# Patient Record
Sex: Male | Born: 1970 | Hispanic: No | Marital: Single | Smoking: Former smoker
Health system: Southern US, Community
[De-identification: ages and names within clinical notes are randomized; demographics above are authoritative.]

## PROBLEM LIST (undated history)

## (undated) DIAGNOSIS — N189 Chronic kidney disease, unspecified: Secondary | ICD-10-CM

## (undated) DIAGNOSIS — I10 Essential (primary) hypertension: Secondary | ICD-10-CM

## (undated) DIAGNOSIS — I5022 Chronic systolic (congestive) heart failure: Secondary | ICD-10-CM

## (undated) DIAGNOSIS — I2510 Atherosclerotic heart disease of native coronary artery without angina pectoris: Secondary | ICD-10-CM

## (undated) DIAGNOSIS — I77810 Thoracic aortic ectasia: Secondary | ICD-10-CM

## (undated) DIAGNOSIS — N182 Chronic kidney disease, stage 2 (mild): Secondary | ICD-10-CM

## (undated) DIAGNOSIS — R809 Proteinuria, unspecified: Secondary | ICD-10-CM

## (undated) DIAGNOSIS — I255 Ischemic cardiomyopathy: Secondary | ICD-10-CM

## (undated) DIAGNOSIS — I2721 Secondary pulmonary arterial hypertension: Secondary | ICD-10-CM

## (undated) DIAGNOSIS — I77819 Aortic ectasia, unspecified site: Secondary | ICD-10-CM

## (undated) DIAGNOSIS — I509 Heart failure, unspecified: Secondary | ICD-10-CM

## (undated) DIAGNOSIS — K5090 Crohn's disease, unspecified, without complications: Secondary | ICD-10-CM

## (undated) HISTORY — DX: Chronic kidney disease, stage 2 (mild): N18.2

## (undated) HISTORY — DX: Heart failure, unspecified: I50.9

## (undated) HISTORY — DX: Secondary pulmonary arterial hypertension: I27.21

## (undated) HISTORY — DX: Chronic systolic (congestive) heart failure: I50.22

## (undated) HISTORY — DX: Chronic kidney disease, unspecified: N18.9

## (undated) HISTORY — DX: Atherosclerotic heart disease of native coronary artery without angina pectoris: I25.10

## (undated) HISTORY — PX: COLECTOMY: SHX59

## (undated) HISTORY — DX: Ischemic cardiomyopathy: I25.5

## (undated) HISTORY — DX: Thoracic aortic ectasia: I77.810

## (undated) HISTORY — DX: Aortic ectasia, unspecified site: I77.819

---

## 2009-04-17 ENCOUNTER — Emergency Department: Payer: Self-pay | Admitting: Emergency Medicine

## 2010-06-11 ENCOUNTER — Emergency Department (HOSPITAL_COMMUNITY)
Admission: EM | Admit: 2010-06-11 | Discharge: 2010-06-11 | Payer: Self-pay | Source: Home / Self Care | Admitting: Emergency Medicine

## 2010-06-11 LAB — DIFFERENTIAL
Basophils Absolute: 0 10*3/uL (ref 0.0–0.1)
Basophils Relative: 0 % (ref 0–1)
Eosinophils Absolute: 0.2 10*3/uL (ref 0.0–0.7)
Eosinophils Relative: 3 % (ref 0–5)
Monocytes Absolute: 0.9 10*3/uL (ref 0.1–1.0)
Neutro Abs: 5.2 10*3/uL (ref 1.7–7.7)

## 2010-06-11 LAB — POCT CARDIAC MARKERS
CKMB, poc: <1 ng/mL — ABNORMAL LOW (ref 1.0–8.0)
Troponin i, poc: <0.05 ng/mL (ref 0.00–0.09)

## 2010-06-11 LAB — CBC
Hemoglobin: 12.9 g/dL — ABNORMAL LOW (ref 13.0–17.0)
MCHC: 33.2 g/dL (ref 30.0–36.0)
Platelets: 214 10*3/uL (ref 150–400)
RDW: 15.2 % (ref 11.5–15.5)

## 2010-06-11 LAB — BASIC METABOLIC PANEL
BUN: 13 mg/dL (ref 6–23)
CO2: 28 mEq/L (ref 19–32)
Chloride: 108 mEq/L (ref 96–112)
Glucose, Bld: 108 mg/dL — ABNORMAL HIGH (ref 70–99)
Potassium: 4.4 mEq/L (ref 3.5–5.1)
Sodium: 143 mEq/L (ref 135–145)

## 2010-06-11 LAB — PROTIME-INR
INR: 0.98 (ref 0.00–1.49)
Prothrombin Time: 13.2 seconds (ref 11.6–15.2)

## 2011-02-25 ENCOUNTER — Emergency Department: Payer: Self-pay | Admitting: Emergency Medicine

## 2011-08-11 ENCOUNTER — Emergency Department: Payer: Self-pay | Admitting: Emergency Medicine

## 2012-09-08 DIAGNOSIS — L138 Other specified bullous disorders: Secondary | ICD-10-CM | POA: Insufficient documentation

## 2012-10-24 ENCOUNTER — Emergency Department: Payer: Self-pay | Admitting: Emergency Medicine

## 2012-10-24 LAB — URINALYSIS, COMPLETE
Glucose,UR: NEGATIVE mg/dL (ref 0–75)
Leukocyte Esterase: NEGATIVE
Nitrite: NEGATIVE
Protein: 100
RBC,UR: <1 /HPF (ref 0–5)
Squamous Epithelial: NONE SEEN

## 2017-08-30 DIAGNOSIS — M7542 Impingement syndrome of left shoulder: Secondary | ICD-10-CM | POA: Insufficient documentation

## 2017-08-30 DIAGNOSIS — M25512 Pain in left shoulder: Secondary | ICD-10-CM | POA: Insufficient documentation

## 2021-08-04 ENCOUNTER — Inpatient Hospital Stay
Admission: EM | Admit: 2021-08-04 | Discharge: 2021-08-10 | DRG: 286 | Disposition: A | Discharge status: Home / Self Care | Payer: Managed Care, Other (non HMO) | Attending: Internal Medicine | Admitting: Internal Medicine

## 2021-08-04 ENCOUNTER — Other Ambulatory Visit: Payer: Self-pay

## 2021-08-04 ENCOUNTER — Encounter: Payer: Self-pay | Admitting: Radiology

## 2021-08-04 ENCOUNTER — Emergency Department: Payer: Managed Care, Other (non HMO)

## 2021-08-04 DIAGNOSIS — I10 Essential (primary) hypertension: Secondary | ICD-10-CM | POA: Diagnosis not present

## 2021-08-04 DIAGNOSIS — I5023 Acute on chronic systolic (congestive) heart failure: Secondary | ICD-10-CM | POA: Diagnosis present

## 2021-08-04 DIAGNOSIS — I130 Hypertensive heart and chronic kidney disease with heart failure and stage 1 through stage 4 chronic kidney disease, or unspecified chronic kidney disease: Principal | ICD-10-CM | POA: Diagnosis present

## 2021-08-04 DIAGNOSIS — N182 Chronic kidney disease, stage 2 (mild): Secondary | ICD-10-CM | POA: Diagnosis present

## 2021-08-04 DIAGNOSIS — R0602 Shortness of breath: Secondary | ICD-10-CM | POA: Diagnosis not present

## 2021-08-04 DIAGNOSIS — R911 Solitary pulmonary nodule: Secondary | ICD-10-CM | POA: Diagnosis present

## 2021-08-04 DIAGNOSIS — I255 Ischemic cardiomyopathy: Secondary | ICD-10-CM | POA: Diagnosis present

## 2021-08-04 DIAGNOSIS — I5021 Acute systolic (congestive) heart failure: Secondary | ICD-10-CM | POA: Diagnosis not present

## 2021-08-04 DIAGNOSIS — R809 Proteinuria, unspecified: Secondary | ICD-10-CM | POA: Diagnosis present

## 2021-08-04 DIAGNOSIS — F17290 Nicotine dependence, other tobacco product, uncomplicated: Secondary | ICD-10-CM | POA: Diagnosis present

## 2021-08-04 DIAGNOSIS — K5090 Crohn's disease, unspecified, without complications: Secondary | ICD-10-CM | POA: Diagnosis present

## 2021-08-04 DIAGNOSIS — Z6832 Body mass index (BMI) 32.0-32.9, adult: Secondary | ICD-10-CM

## 2021-08-04 DIAGNOSIS — R739 Hyperglycemia, unspecified: Secondary | ICD-10-CM | POA: Diagnosis present

## 2021-08-04 DIAGNOSIS — I509 Heart failure, unspecified: Secondary | ICD-10-CM | POA: Diagnosis not present

## 2021-08-04 DIAGNOSIS — Z882 Allergy status to sulfonamides status: Secondary | ICD-10-CM | POA: Diagnosis not present

## 2021-08-04 DIAGNOSIS — Z888 Allergy status to other drugs, medicaments and biological substances status: Secondary | ICD-10-CM

## 2021-08-04 DIAGNOSIS — Z20822 Contact with and (suspected) exposure to covid-19: Secondary | ICD-10-CM | POA: Diagnosis present

## 2021-08-04 DIAGNOSIS — I2582 Chronic total occlusion of coronary artery: Secondary | ICD-10-CM | POA: Diagnosis present

## 2021-08-04 DIAGNOSIS — Z9861 Coronary angioplasty status: Secondary | ICD-10-CM | POA: Diagnosis not present

## 2021-08-04 DIAGNOSIS — Z933 Colostomy status: Secondary | ICD-10-CM

## 2021-08-04 DIAGNOSIS — I2721 Secondary pulmonary arterial hypertension: Secondary | ICD-10-CM | POA: Diagnosis present

## 2021-08-04 DIAGNOSIS — E669 Obesity, unspecified: Secondary | ICD-10-CM | POA: Diagnosis present

## 2021-08-04 DIAGNOSIS — R0609 Other forms of dyspnea: Secondary | ICD-10-CM | POA: Diagnosis not present

## 2021-08-04 DIAGNOSIS — J189 Pneumonia, unspecified organism: Secondary | ICD-10-CM | POA: Diagnosis not present

## 2021-08-04 DIAGNOSIS — I248 Other forms of acute ischemic heart disease: Secondary | ICD-10-CM | POA: Diagnosis present

## 2021-08-04 DIAGNOSIS — I25118 Atherosclerotic heart disease of native coronary artery with other forms of angina pectoris: Secondary | ICD-10-CM | POA: Diagnosis present

## 2021-08-04 DIAGNOSIS — K50918 Crohn's disease, unspecified, with other complication: Secondary | ICD-10-CM | POA: Diagnosis not present

## 2021-08-04 DIAGNOSIS — Z9049 Acquired absence of other specified parts of digestive tract: Secondary | ICD-10-CM

## 2021-08-04 DIAGNOSIS — R001 Bradycardia, unspecified: Secondary | ICD-10-CM | POA: Diagnosis not present

## 2021-08-04 DIAGNOSIS — R778 Other specified abnormalities of plasma proteins: Secondary | ICD-10-CM

## 2021-08-04 DIAGNOSIS — I2510 Atherosclerotic heart disease of native coronary artery without angina pectoris: Secondary | ICD-10-CM | POA: Diagnosis not present

## 2021-08-04 DIAGNOSIS — I420 Dilated cardiomyopathy: Secondary | ICD-10-CM | POA: Diagnosis not present

## 2021-08-04 HISTORY — DX: Crohn's disease, unspecified, without complications: K50.90

## 2021-08-04 HISTORY — DX: Essential (primary) hypertension: I10

## 2021-08-04 HISTORY — DX: Proteinuria, unspecified: R80.9

## 2021-08-04 LAB — RESP PANEL BY RT-PCR (FLU A&B, COVID) ARPGX2
Influenza A by PCR: NEGATIVE
Influenza B by PCR: NEGATIVE
SARS Coronavirus 2 by RT PCR: NEGATIVE

## 2021-08-04 LAB — BASIC METABOLIC PANEL
Anion gap: 8 (ref 5–15)
BUN: 16 mg/dL (ref 6–20)
CO2: 30 mmol/L (ref 22–32)
Calcium: 9.1 mg/dL (ref 8.9–10.3)
Chloride: 100 mmol/L (ref 98–111)
Creatinine, Ser: 1.29 mg/dL — ABNORMAL HIGH (ref 0.61–1.24)
GFR, Estimated: >60 mL/min (ref >60)
Glucose, Bld: 144 mg/dL — ABNORMAL HIGH (ref 70–99)
Potassium: 3.9 mmol/L (ref 3.5–5.1)
Sodium: 138 mmol/L (ref 135–145)

## 2021-08-04 LAB — BRAIN NATRIURETIC PEPTIDE: B Natriuretic Peptide: 559.8 pg/mL — ABNORMAL HIGH (ref 0.0–100.0)

## 2021-08-04 LAB — CBC
HCT: 46 % (ref 39.0–52.0)
Hemoglobin: 15.4 g/dL (ref 13.0–17.0)
MCH: 28.3 pg (ref 26.0–34.0)
MCHC: 33.5 g/dL (ref 30.0–36.0)
MCV: 84.6 fL (ref 80.0–100.0)
Platelets: 185 10*3/uL (ref 150–400)
RBC: 5.44 MIL/uL (ref 4.22–5.81)
RDW: 14.6 % (ref 11.5–15.5)
WBC: 7.2 10*3/uL (ref 4.0–10.5)
nRBC: 0 % (ref 0.0–0.2)

## 2021-08-04 LAB — TSH: TSH: 0.258 u[IU]/mL — ABNORMAL LOW (ref 0.350–4.500)

## 2021-08-04 LAB — PROTEIN / CREATININE RATIO, URINE
Creatinine, Urine: 29 mg/dL
Protein Creatinine Ratio: 1.41 mg/mg{Cre} — ABNORMAL HIGH (ref 0.00–0.15)
Total Protein, Urine: 41 mg/dL

## 2021-08-04 LAB — TROPONIN I (HIGH SENSITIVITY)
Troponin I (High Sensitivity): 89 ng/L — ABNORMAL HIGH (ref <18)
Troponin I (High Sensitivity): 80 ng/L — ABNORMAL HIGH (ref <18)

## 2021-08-04 MED ORDER — PREDNISONE 20 MG PO TABS
60.0000 mg | ORAL_TABLET | Freq: Once | ORAL | Status: AC
  Administered 2021-08-04: 60 mg via ORAL
  Filled 2021-08-04: qty 3

## 2021-08-04 MED ORDER — IPRATROPIUM-ALBUTEROL 0.5-2.5 (3) MG/3ML IN SOLN
9.0000 mL | Freq: Once | RESPIRATORY_TRACT | Status: AC
  Administered 2021-08-04: 9 mL via RESPIRATORY_TRACT
  Filled 2021-08-04: qty 3

## 2021-08-04 MED ORDER — SODIUM CHLORIDE 0.9 % IV SOLN
1.0000 g | Freq: Once | INTRAVENOUS | Status: AC
  Administered 2021-08-04: 1 g via INTRAVENOUS
  Filled 2021-08-04: qty 10

## 2021-08-04 MED ORDER — FUROSEMIDE 10 MG/ML IJ SOLN
80.0000 mg | Freq: Once | INTRAMUSCULAR | Status: AC
  Administered 2021-08-04: 80 mg via INTRAVENOUS
  Filled 2021-08-04: qty 8

## 2021-08-04 MED ORDER — IOHEXOL 350 MG/ML SOLN
75.0000 mL | Freq: Once | INTRAVENOUS | Status: AC | PRN
  Administered 2021-08-04: 75 mL via INTRAVENOUS

## 2021-08-04 MED ORDER — SODIUM CHLORIDE 0.9 % IV SOLN
500.0000 mg | Freq: Once | INTRAVENOUS | Status: AC
  Administered 2021-08-04: 500 mg via INTRAVENOUS
  Filled 2021-08-04: qty 5

## 2021-08-04 MED ORDER — LABETALOL HCL 5 MG/ML IV SOLN
20.0000 mg | INTRAVENOUS | Status: DC | PRN
  Administered 2021-08-04 – 2021-08-07 (×2): 20 mg via INTRAVENOUS
  Filled 2021-08-04 (×2): qty 4

## 2021-08-04 MED ORDER — ENOXAPARIN SODIUM 60 MG/0.6ML IJ SOSY
50.0000 mg | PREFILLED_SYRINGE | Freq: Every day | INTRAMUSCULAR | Status: DC
  Administered 2021-08-04 – 2021-08-05 (×2): 50 mg via SUBCUTANEOUS
  Filled 2021-08-04 (×2): qty 0.6

## 2021-08-04 NOTE — ED Notes (Signed)
Pt NAD in bed, breathing even and unlabored. A/ox4, speaking in full and complete sentences. Pt c/o incerased exertional dyspnea x 4-5 months with extra worsening over the last few days. +peripheral edema. -CP, resting SOB. LS clear bilaterally.  ?

## 2021-08-04 NOTE — ED Provider Notes (Signed)
? ?[facility] ?Provider Note ? ? ? Event Date/Time  ? First MD Initiated Contact with Patient 08/04/21 1118   ?  (approximate) ? ? ?History  ? ?Chief Complaint ?Shortness of Breath and Cough ? ? ?HPI ? ?Clarence [name] is a 51 y.o. male with past medical history of Crohn's disease status post colectomy who presents to the ED complaining of shortness of breath.  Patient reports that he has been dealing with intermittent difficulty breathing for the past 6 months but that symptoms have gotten acutely worse over the past 2 days.  He states he is now getting increasingly dyspneic with exertion, has been dealing with a productive cough and significant congestion.  He denies any fevers and has not had any pain in his chest.  He does state that both legs have been slightly swollen recently, denies any pain in either leg.  He smokes cigarillos occasionally, does report being exposed to secondhand smoke by his roommate.  He denies any history of COPD, asthma, or DVT/PE.  He has not taken anything for his symptoms prior to arrival. ?  ? ? ?Physical Exam  ? ?Triage Vital Signs: ?ED Triage Vitals  ?Enc Vitals Group  ?   BP 08/04/21 1044 (!) 206/142  ?   Pulse Rate 08/04/21 1044 (!) 109  ?   Resp 08/04/21 1044 (!) 32  ?   Temp 08/04/21 1044 98.7 ?F (37.1 ?C)  ?   Temp Source 08/04/21 1044 Oral  ?   SpO2 08/04/21 1044 99 %  ?   Weight 08/04/21 1045 223 lb (101.2 kg)  ?   Height 08/04/21 1045 5' 10"  (1.778 m)  ?   Head Circumference --   ?   Peak Flow --   ?   Pain Score 08/04/21 1044 7  ?   Pain Loc --   ?   Pain Edu? --   ?   Excl. in [place]? --   ? ? ?Most recent vital signs: ?Vitals:  ? 08/04/21 1044  ?BP: (!) 206/142  ?Pulse: (!) 109  ?Resp: (!) 32  ?Temp: 98.7 ?F (37.1 ?C)  ?SpO2: 99%  ? ? ?Constitutional: Alert and oriented. ?Eyes: Conjunctivae are normal. ?Head: Atraumatic. ?Nose: No congestion/rhinnorhea. ?Mouth/Throat: Mucous membranes are moist.  ?Cardiovascular: Tachycardic, regular rhythm. Grossly  normal heart sounds.  2+ radial pulses bilaterally. ?Respiratory: Tachypneic with increased respiratory effort noted, no respiratory distress.  Inspiratory next Tory wheezing noted. ?Gastrointestinal: Soft and nontender. No distention. ?Musculoskeletal: No lower extremity tenderness, trace pitting edema to mid shins bilaterally. ?Neurologic:  Normal speech and language. No gross focal neurologic deficits are appreciated. ? ? ? ?ED Results / Procedures / Treatments  ? ?Labs ?(all labs ordered are listed, but only abnormal results are displayed) ?Labs Reviewed  ?BASIC METABOLIC PANEL - Abnormal; Notable for the following components:  ?    Result Value  ? Glucose, Bld 144 (*)   ? Creatinine, Ser 1.29 (*)   ? All other components within normal limits  ?BRAIN NATRIURETIC PEPTIDE - Abnormal; Notable for the following components:  ? B Natriuretic Peptide 559.8 (*)   ? All other components within normal limits  ?TROPONIN I (HIGH SENSITIVITY) - Abnormal; Notable for the following components:  ? Troponin I (High Sensitivity) 89 (*)   ? All other components within normal limits  ?RESP PANEL BY RT-PCR (FLU A&B, COVID) ARPGX2  ?CBC  ?TROPONIN I (HIGH SENSITIVITY)  ? ? ? ?EKG ? ?ED ECG REPORT ?I,  Blake Divine, the attending physician, personally viewed and interpreted this ECG. ? ? Date: 08/04/2021 ? EKG Time: 10:39 ? Rate: 111 ? Rhythm: sinus tachycardia ? Axis: Normal ? Intervals:left bundle branch block ? ST&T Change: Inferior T wave inversions ? ?RADIOLOGY ?Chest x-ray reviewed by me with possible right upper lobe infiltrate and mild edema noted. ? ?PROCEDURES: ? ?Critical Care performed: No ? ?.1-3 Lead EKG Interpretation ?Performed by: Blake Divine, MD ?Authorized by: Blake Divine, MD  ? ?  Interpretation: abnormal   ?  ECG rate:  100-115 ?  ECG rate assessment: tachycardic   ?  Rhythm: sinus rhythm   ?  Ectopy: none   ?  Conduction: normal   ? ? ?MEDICATIONS ORDERED IN ED: ?Medications  ?furosemide (LASIX)  injection 80 mg (has no administration in time range)  ?ipratropium-albuterol (DUONEB) 0.5-2.5 (3) MG/3ML nebulizer solution 9 mL (9 mLs Nebulization Given 08/04/21 1210)  ?predniSONE (DELTASONE) tablet 60 mg (60 mg Oral Given 08/04/21 1210)  ?iohexol (OMNIPAQUE) 350 MG/ML injection 75 mL (75 mLs Intravenous Contrast Given 08/04/21 1231)  ? ? ? ?IMPRESSION / MDM / ASSESSMENT AND PLAN / ED COURSE  ?I reviewed the triage vital signs and the nursing notes. ?             ?               ? ?51 y.o. male with past medical history of Crohn's disease status post colectomy who presents to the ED with increasing difficulty breathing over the past couple of days associated with productive cough. ? ?Differential diagnosis includes, but is not limited to, pneumonia, bronchitis, COPD, asthma, CHF, ACS, PE. ? ?Patient is in mild respiratory distress with tachypnea and increased respiratory effort, continues to maintain O2 sats on room air.  He appears mildly fluid overloaded and additionally has wheezing on exam.  We will treat with steroids and DuoNebs, add on BMP to further assess for CHF.  EKG shows left bundle branch block with no prior for comparison, no ischemic changes noted.  Chest x-ray with questionable right upper lobe infiltrate, additionally with possible pulmonary edema.  We will further assess with CTA for pneumonia versus PE. ? ?The patient is on the cardiac monitor to evaluate for evidence of arrhythmia and/or significant heart rate changes. ? ?CTA is negative for PE but does show cardiomegaly and likely pulmonary edema along with scattered findings concerning for infection in the right lung.  We will treat with IV Rocephin and azithromycin, additionally give dose of IV Lasix.  Presentation is concerning for new onset CHF given his mildly elevated troponin and mildly elevated BNP along with abnormal EKG.  Additional labs are reassuring with CBC showing no anemia and BMP with no electrolyte abnormality.  Case  discussed with hospitalist for admission. ? ? ? ?FINAL CLINICAL IMPRESSION(S) / ED DIAGNOSES  ? ?Final diagnoses:  ?Shortness of breath  ?Acute congestive heart failure, unspecified heart failure type ([place])  ? ? ? ?Rx / DC Orders  ? ?ED Discharge Orders   ? ? None  ? ?  ? ? ? ?Note:  This document was prepared using Dragon voice recognition software and may include unintentional dictation errors. ?  ?Blake Divine, MD ?08/04/21 1347 ? ?

## 2021-08-04 NOTE — ED Notes (Signed)
Pt up to restroom with steady gait.

## 2021-08-04 NOTE — H&P (Addendum)
?History and Physical  ? ? ?[name] BMW:[id number] DOB: 1971/04/10 DOA: 08/04/2021 ? ?PCP: Lynnell Jude, MD  ?Patient coming from: home ? ? ?Chief Complaint: dyspnea ? ?HPI: Clarence [name] is a 51 y.o. male with medical history significant for crohn's disease and htn who presents with the above. ? ?Says that for several months he has noted progressively worsening dyspnea on exertion as well as orthopnea and lower extremity swelling. Says he saw a nephrologist recently and told proteinuria was the cause of his swelling. Reports compliance with home meds but is unsure what he takes. Denies chest pain. No fevers. Has chronic cough, occasionally productive. Reports several days worsening dyspnea on exertion that triggered presenting to our ED.  ? ?ED Course:  ? ?Labs, imaging. Lasix 80 IV given as well as corticosteroid and azithromycin/ceftriaxone ? ?Review of Systems: As per HPI otherwise 10 point review of systems negative.  ? ? ? ? ? ? ? has no history on file for tobacco use, alcohol use, and drug use. ? ?Allergies  ?Allergen Reactions  ? Ibuprofen Other (See Comments) and Hives  ?  Other reaction(s): GI Upset (intolerance) ?Hives ?  ? Neomycin-Bacitracin Zn-Polymyx   ?  Other reaction(s): Other (See Comments)  ? Sulfacetamide Other (See Comments)  ? ? ?No family history on file. ? ?Prior to Admission medications   ?Not on File  ? ? ?Physical Exam: ?Vitals:  ? 08/04/21 1044 08/04/21 1045 08/04/21 1404 08/04/21 1730  ?BP: (!) 206/142  135/78 (!) 157/102  ?Pulse: (!) 109  (!) 109 92  ?Resp: (!) 32  (!) 22 (!) 32  ?Temp: 98.7 ?F (37.1 ?C)  98.3 ?F (36.8 ?C)   ?TempSrc: Oral  Oral   ?SpO2: 99%  96% 94%  ?Weight:  101.2 kg    ?Height:  5' 10"  (1.778 m)    ? ? ?Constitutional: No acute distress ?Head: Atraumatic ?Eyes: Conjunctiva clear ?ENM: Moist mucous membranes. Normal dentition.  ?Neck: Supple ?Respiratory: mild tachypnea, no increased WOB, rales at bases, no wheeze ?Cardiovascular: tachycardic,  rrr ?Abdomen: Non-tender, non-distended. No masses. No rebound or guarding. Positive bowel sounds. ?Musculoskeletal: No joint deformity upper and lower extremities. Normal ROM, no contractures. Normal muscle tone.  ?Skin: No rashes, lesions, or ulcers.  ?Extremities: No peripheral edema. Palpable peripheral pulses. ?Neurologic: Alert, moving all 4 extremities. ?Psychiatric: Normal insight and judgement. ? ? ?Labs on Admission: I have personally reviewed following labs and imaging studies ? ?CBC: ?Recent Labs  ?Lab 08/04/21 ?1050  ?WBC 7.2  ?HGB 15.4  ?HCT 46.0  ?MCV 84.6  ?PLT 185  ? ?Basic Metabolic Panel: ?Recent Labs  ?Lab 08/04/21 ?1050  ?NA 138  ?K 3.9  ?CL 100  ?CO2 30  ?GLUCOSE 144*  ?BUN 16  ?CREATININE 1.29*  ?CALCIUM 9.1  ? ?GFR: ?Estimated Creatinine Clearance: 79.9 mL/min (A) (by C-G formula based on SCr of 1.29 mg/dL (H)). ?Liver Function Tests: ?No results for input(s): AST, ALT, ALKPHOS, BILITOT, PROT, ALBUMIN in the last 168 hours. ?No results for input(s): LIPASE, AMYLASE in the last 168 hours. ?No results for input(s): AMMONIA in the last 168 hours. ?Coagulation Profile: ?No results for input(s): INR, PROTIME in the last 168 hours. ?Cardiac Enzymes: ?No results for input(s): CKTOTAL, CKMB, CKMBINDEX, TROPONINI in the last 168 hours. ?BNP (last 3 results) ?No results for input(s): PROBNP in the last 8760 hours. ?HbA1C: ?No results for input(s): HGBA1C in the last 72 hours. ?CBG: ?No results for input(s): GLUCAP in the last  168 hours. ?Lipid Profile: ?No results for input(s): CHOL, HDL, LDLCALC, TRIG, CHOLHDL, LDLDIRECT in the last 72 hours. ?Thyroid Function Tests: ?No results for input(s): TSH, T4TOTAL, FREET4, T3FREE, THYROIDAB in the last 72 hours. ?Anemia Panel: ?No results for input(s): VITAMINB12, FOLATE, FERRITIN, TIBC, IRON, RETICCTPCT in the last 72 hours. ?Urine analysis: ?   ?Component Value Date/Time  ? COLORURINE Yellow 10/24/2012 1000  ? APPEARANCEUR Clear 10/24/2012 1000  ? LABSPEC  1.015 10/24/2012 1000  ? PHURINE 5.0 10/24/2012 1000  ? GLUCOSEU Negative 10/24/2012 1000  ? HGBUR Negative 10/24/2012 1000  ? BILIRUBINUR Negative 10/24/2012 1000  ? KETONESUR Negative 10/24/2012 1000  ? PROTEINUR 100 mg/dL 10/24/2012 1000  ? NITRITE Negative 10/24/2012 1000  ? LEUKOCYTESUR Negative 10/24/2012 1000  ? ? ?Radiological Exams on Admission: ?DG Chest 2 View ? ?Result Date: 08/04/2021 ?CLINICAL DATA:  A 51 year old male presents for evaluation of shortness of breath. EXAM: CHEST - 2 VIEW COMPARISON:  June 11, 2010. FINDINGS: EKG leads project over the chest. Trachea midline. Cardiomediastinal contours and hilar structures with cardiac enlargement and central pulmonary vascular engorgement. Subtle opacity in the RIGHT upper chest. Generalized interstitial prominence, question early edema. No sign of pleural effusion. On limited assessment there is no acute skeletal process. IMPRESSION: 1. Cardiomegaly with central pulmonary vascular engorgement and interstitial prominence, question early edema mild asymmetry raising the question of infection. 2. No effusion. 3. Subtle opacity in the RIGHT upper chest, may represent developing infection, small nodule in this area is not excluded, in the acute setting this remains of uncertain significance. Suggest follow-up PA and lateral chest radiograph following therapy in 6-8 weeks to ensure resolution. Electronically Signed   By: Zetta Bills M.D.   On: 08/04/2021 11:58  ? ?CT Angio Chest PE W/Cm &/Or Wo Cm ? ?Result Date: 08/04/2021 ?CLINICAL DATA:  PE suspected, high probability. EXAM: CT ANGIOGRAPHY CHEST WITH CONTRAST TECHNIQUE: Multidetector CT imaging of the chest was performed using the standard protocol during bolus administration of intravenous contrast. Multiplanar CT image reconstructions and MIPs were obtained to evaluate the vascular anatomy. RADIATION DOSE REDUCTION: This exam was performed according to the departmental dose-optimization program  which includes automated exposure control, adjustment of the mA and/or kV according to patient size and/or use of iterative reconstruction technique. CONTRAST:  47m OMNIPAQUE IOHEXOL 350 MG/ML SOLN COMPARISON:  Chest radiograph June 11, 2010 FINDINGS: Cardiovascular: Satisfactory opacification of the pulmonary arteries to the segmental level. No evidence of pulmonary embolism. Normal caliber thoracic aorta. Cardiomegaly. No significant pericardial effusion/thickening. Mediastinum/Nodes: Mildly enlarged/prominent mediastinal and hilar lymph nodes. For reference: -Left paratracheal lymph node measures 1 cm in short axis on image 22/4 -Prevascular lymph node measures 1 cm in short axis on image 39/4 -Right hilar lymph node measures 12 mm in short axis on image 57/4 Lungs/Pleura: Multifocal areas of clustered nodularity in the right lung for instance in the right upper lobe on image 38/6 and in the paramedian right lower lobe best seen on coronal image 66/7. Mild interstitial thickening with adjacent ground-glass opacities in the lung bases likely reflects mild pulmonary edema. No pleural effusion. No pneumothorax. Upper Abdomen: Mild splenomegaly measuring 15.8 cm in maximum axial dimension, slightly increased from CT February 06, 2020. Musculoskeletal: No chest wall abnormality. No acute or significant osseous findings. Review of the MIP images confirms the above findings. IMPRESSION: 1. No evidence of pulmonary embolism. 2. Multifocal areas of clustered nodularity in the right lung, likely reflecting an infectious or inflammatory process. Recommend follow-up CT  in 1-3 months to ensure resolution. 3. Cardiomegaly with mild interstitial thickening with adjacent ground-glass opacities in the lung bases likely reflects mild pulmonary edema. 4. Mildly enlarged/prominent mediastinal and hilar lymph nodes, likely reactive. Attention on follow-up imaging suggested. 5. Mild splenomegaly, slightly increased from CT  February 06, 2020, and of indeterminate clinical significance. Electronically Signed   By: Dahlia Bailiff M.D.   On: 08/04/2021 12:55   ? ?EKG: Independently reviewed. Twi v6 and avf ? ?Assessment/Plan ?Principal Proble

## 2021-08-04 NOTE — ED Triage Notes (Signed)
Pt to ED via POV with c/o SHOB, cough, loss of taste and DOE. He got winded just walking from the EKG chair to the bench in the triage room. Pt speaks in 3-4 word sentences. Gets second hand smoke.  ?

## 2021-08-05 ENCOUNTER — Inpatient Hospital Stay (HOSPITAL_COMMUNITY)
Admit: 2021-08-05 | Discharge: 2021-08-05 | Disposition: A | Discharge status: Home / Self Care | Payer: Managed Care, Other (non HMO) | Attending: Obstetrics and Gynecology | Admitting: Obstetrics and Gynecology

## 2021-08-05 ENCOUNTER — Encounter: Payer: Self-pay | Admitting: Obstetrics and Gynecology

## 2021-08-05 DIAGNOSIS — I10 Essential (primary) hypertension: Secondary | ICD-10-CM | POA: Diagnosis not present

## 2021-08-05 DIAGNOSIS — R778 Other specified abnormalities of plasma proteins: Secondary | ICD-10-CM

## 2021-08-05 DIAGNOSIS — K50918 Crohn's disease, unspecified, with other complication: Secondary | ICD-10-CM

## 2021-08-05 DIAGNOSIS — N182 Chronic kidney disease, stage 2 (mild): Secondary | ICD-10-CM

## 2021-08-05 DIAGNOSIS — I5021 Acute systolic (congestive) heart failure: Secondary | ICD-10-CM

## 2021-08-05 DIAGNOSIS — R0609 Other forms of dyspnea: Secondary | ICD-10-CM

## 2021-08-05 DIAGNOSIS — J189 Pneumonia, unspecified organism: Secondary | ICD-10-CM

## 2021-08-05 LAB — CBC
HCT: 43.2 % (ref 39.0–52.0)
Hemoglobin: 14.8 g/dL (ref 13.0–17.0)
MCH: 28.6 pg (ref 26.0–34.0)
MCHC: 34.3 g/dL (ref 30.0–36.0)
MCV: 83.6 fL (ref 80.0–100.0)
Platelets: 185 10*3/uL (ref 150–400)
RBC: 5.17 MIL/uL (ref 4.22–5.81)
RDW: 14.5 % (ref 11.5–15.5)
WBC: 7.8 10*3/uL (ref 4.0–10.5)
nRBC: 0 % (ref 0.0–0.2)

## 2021-08-05 LAB — ECHOCARDIOGRAM COMPLETE
AR max vel: 3.14 cm2
AV Area VTI: 3.19 cm2
AV Area mean vel: 3 cm2
AV Mean grad: 4 mmHg
AV Peak grad: 6.6 mmHg
Ao pk vel: 1.28 m/s
Area-P 1/2: 4.06 cm2
Calc EF: 32.5 %
Height: 70 in
MV VTI: 2.94 cm2
S' Lateral: 5 cm
Single Plane A2C EF: 31.5 %
Single Plane A4C EF: 35.2 %
Weight: 3568 oz

## 2021-08-05 LAB — BASIC METABOLIC PANEL
Anion gap: 10 (ref 5–15)
BUN: 29 mg/dL — ABNORMAL HIGH (ref 6–20)
CO2: 27 mmol/L (ref 22–32)
Calcium: 8.9 mg/dL (ref 8.9–10.3)
Chloride: 104 mmol/L (ref 98–111)
Creatinine, Ser: 1.46 mg/dL — ABNORMAL HIGH (ref 0.61–1.24)
GFR, Estimated: 58 mL/min — ABNORMAL LOW (ref >60)
Glucose, Bld: 116 mg/dL — ABNORMAL HIGH (ref 70–99)
Potassium: 3.7 mmol/L (ref 3.5–5.1)
Sodium: 141 mmol/L (ref 135–145)

## 2021-08-05 LAB — LIPID PANEL
Cholesterol: 207 mg/dL — ABNORMAL HIGH (ref 0–200)
HDL: 34 mg/dL — ABNORMAL LOW (ref >40)
LDL Cholesterol: 134 mg/dL — ABNORMAL HIGH (ref 0–99)
Total CHOL/HDL Ratio: 6.1 RATIO
Triglycerides: 196 mg/dL — ABNORMAL HIGH (ref <150)
VLDL: 39 mg/dL (ref 0–40)

## 2021-08-05 LAB — HEMOGLOBIN A1C
Hgb A1c MFr Bld: 5.4 % (ref 4.8–5.6)
Mean Plasma Glucose: 108 mg/dL

## 2021-08-05 LAB — HEPATITIS C ANTIBODY: HCV Ab: NONREACTIVE

## 2021-08-05 LAB — HIV ANTIBODY (ROUTINE TESTING W REFLEX): HIV Screen 4th Generation wRfx: NONREACTIVE

## 2021-08-05 LAB — MAGNESIUM: Magnesium: 2.1 mg/dL (ref 1.7–2.4)

## 2021-08-05 LAB — PROCALCITONIN: Procalcitonin: 0.14 ng/mL

## 2021-08-05 MED ORDER — SODIUM CHLORIDE 0.9 % IV SOLN
2.0000 g | INTRAVENOUS | Status: DC
  Administered 2021-08-05 – 2021-08-08 (×4): 2 g via INTRAVENOUS
  Filled 2021-08-05: qty 20
  Filled 2021-08-05: qty 2
  Filled 2021-08-05 (×2): qty 20

## 2021-08-05 MED ORDER — ASPIRIN EC 81 MG PO TBEC
81.0000 mg | DELAYED_RELEASE_TABLET | Freq: Every day | ORAL | Status: DC
  Administered 2021-08-05 – 2021-08-10 (×5): 81 mg via ORAL
  Filled 2021-08-05 (×6): qty 1

## 2021-08-05 MED ORDER — GUAIFENESIN-DM 100-10 MG/5ML PO SYRP
5.0000 mL | ORAL_SOLUTION | ORAL | Status: DC | PRN
  Administered 2021-08-05 – 2021-08-06 (×3): 5 mL via ORAL
  Filled 2021-08-05: qty 10
  Filled 2021-08-05: qty 5
  Filled 2021-08-05: qty 10

## 2021-08-05 MED ORDER — FUROSEMIDE 10 MG/ML IJ SOLN
40.0000 mg | Freq: Two times a day (BID) | INTRAMUSCULAR | Status: DC
Start: 2021-08-05 — End: 2021-08-10
  Administered 2021-08-05 – 2021-08-10 (×9): 40 mg via INTRAVENOUS
  Filled 2021-08-05 (×9): qty 4

## 2021-08-05 MED ORDER — CARVEDILOL 3.125 MG PO TABS
3.1250 mg | ORAL_TABLET | Freq: Two times a day (BID) | ORAL | Status: DC
Start: 2021-08-05 — End: 2021-08-07
  Administered 2021-08-05 – 2021-08-06 (×3): 3.125 mg via ORAL
  Filled 2021-08-05 (×3): qty 1

## 2021-08-05 MED ORDER — PERFLUTREN LIPID MICROSPHERE
1.0000 mL | INTRAVENOUS | Status: AC | PRN
  Administered 2021-08-05: 2 mL via INTRAVENOUS
  Filled 2021-08-05: qty 10

## 2021-08-05 MED ORDER — ALBUTEROL SULFATE (2.5 MG/3ML) 0.083% IN NEBU
2.5000 mg | INHALATION_SOLUTION | Freq: Three times a day (TID) | RESPIRATORY_TRACT | Status: DC
  Administered 2021-08-05 – 2021-08-10 (×16): 2.5 mg via RESPIRATORY_TRACT
  Filled 2021-08-05 (×17): qty 3

## 2021-08-05 MED ORDER — AZITHROMYCIN 250 MG PO TABS
250.0000 mg | ORAL_TABLET | Freq: Every day | ORAL | Status: AC
  Administered 2021-08-05 – 2021-08-08 (×4): 250 mg via ORAL
  Filled 2021-08-05 (×4): qty 1

## 2021-08-05 MED ORDER — ONDANSETRON 4 MG PO TBDP
4.0000 mg | ORAL_TABLET | Freq: Three times a day (TID) | ORAL | Status: DC | PRN
  Administered 2021-08-05 – 2021-08-10 (×2): 4 mg via ORAL
  Filled 2021-08-05 (×4): qty 1

## 2021-08-05 MED ORDER — POTASSIUM CHLORIDE CRYS ER 20 MEQ PO TBCR
20.0000 meq | EXTENDED_RELEASE_TABLET | Freq: Every day | ORAL | Status: DC
  Administered 2021-08-05 – 2021-08-10 (×6): 20 meq via ORAL
  Filled 2021-08-05 (×6): qty 1

## 2021-08-05 MED ORDER — ALBUTEROL SULFATE (2.5 MG/3ML) 0.083% IN NEBU
2.5000 mg | INHALATION_SOLUTION | Freq: Once | RESPIRATORY_TRACT | Status: AC
  Administered 2021-08-05: 2.5 mg via RESPIRATORY_TRACT
  Filled 2021-08-05: qty 3

## 2021-08-05 MED ORDER — PREDNISONE 20 MG PO TABS
40.0000 mg | ORAL_TABLET | Freq: Every day | ORAL | Status: DC
  Administered 2021-08-05 – 2021-08-06 (×2): 40 mg via ORAL
  Filled 2021-08-05 (×2): qty 2

## 2021-08-05 MED ORDER — FUROSEMIDE 10 MG/ML IJ SOLN
40.0000 mg | Freq: Once | INTRAMUSCULAR | Status: AC
  Administered 2021-08-05: 40 mg via INTRAVENOUS
  Filled 2021-08-05: qty 4

## 2021-08-05 NOTE — Assessment & Plan Note (Signed)
Patient given a dose of Lasix this morning and will make Lasix twice daily dosing.  Add a low-dose of Coreg.  EF 30 to 35% on echocardiogram.  We will probably have to restart ACE inhibitor but will hold for right now secondary to increasing creatinine after CT scan.  Case discussed with cardiology to consult. ?

## 2021-08-05 NOTE — Progress Notes (Signed)
*  PRELIMINARY RESULTS* ?Echocardiogram ?2D Echocardiogram has been performed. ? ?Clarence [name] ?08/05/2021, 10:03 AM ?

## 2021-08-05 NOTE — Assessment & Plan Note (Signed)
Holding lisinopril HCT for right now.  We will start low-dose Coreg.  Continue IV Lasix. ?

## 2021-08-05 NOTE — Consult Note (Addendum)
?Cardiology Consultation:  ? ?Patient ID: Clarence [name] ?MRN: [id number]; DOB: Nov 27, 1970 ? ?Admit date: 08/04/2021 ?Date of Consult: 08/05/2021 ? ?PCP:  Lynnell Jude, MD ?  ?[facility] ?Cardiologist: New-Derrious Bologna ? ?Patient Profile:  ? ?Clarence [name] is a 51 y.o. male with a hx of Crohn's disease s/p colectomy with ostomy and hypertension who is being seen 08/05/2021 for the evaluation of acute HFrEF at the request of Dr. Leslye Peer. ? ?History of Present Illness:  ? ?Clarence [name] reports progressive dyspnea on exertion over the last several months.  It has become quite pronounced over the last 1 to 2 weeks.  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  At other times, he is able to walk 150 feet before needing to stop.  He denies dyspnea at rest but endorses two-pillow orthopnea over the last few months as well as intermittent PND.  He notes occasional puffiness in his legs, especially noticeable when he wears tight socks.  He has put on about 5 pounds over the last 2 to 3 months.  He has not had any chest pain.  He reports sporadic palpitations, and having had 4-5 episodes over the last 20 years.  He denies a history of heart problems.  He has never seen a cardiologist. ? ?On further questioning, Clarence [name] notes that he recently was diagnosed with proteinuria and has been evaluated by nephrology.  He is currently on amlodipine and lisinopril-HCTZ for management of his proteinuria and hypertension. ? ?Past Medical History:  ?Diagnosis Date  ? Crohn's disease ([place])   ? Hypertension   ? Proteinuria   ? ? ?Past Surgical History:  ?Procedure Laterality Date  ? COLECTOMY    ?  ? ?Home Medications:  ?Prior to Admission medications   ?Medication Sig Start Date Kinsly Hild Date Taking? Authorizing Provider  ?lisinopril-hydrochlorothiazide (ZESTORETIC) 20-25 MG tablet Take 1 tablet by mouth in the morning and at bedtime.   Yes [provider]  ? ? ?Inpatient Medications: ?Scheduled  Meds: ? albuterol  2.5 mg Nebulization TID  ? azithromycin  250 mg Oral Daily  ? carvedilol  3.125 mg Oral BID WC  ? enoxaparin (LOVENOX) injection  50 mg Subcutaneous QHS  ? furosemide  40 mg Intravenous BID  ? potassium chloride  20 mEq Oral Daily  ? predniSONE  40 mg Oral Q breakfast  ? ?Continuous Infusions: ? cefTRIAXone (ROCEPHIN)  IV Stopped (08/05/21 0955)  ? ?PRN Meds: ?guaiFENesin-dextromethorphan, labetalol ? ?Allergies:    ?Allergies  ?Allergen Reactions  ? Ibuprofen Other (See Comments) and Hives  ?  Other reaction(s): GI Upset (intolerance) ?Hives ?  ? Neomycin-Bacitracin Zn-Polymyx   ?  Other reaction(s): Other (See Comments)  ? Sulfacetamide Other (See Comments)  ? ? ?Social History:   ?Social History  ? ?Tobacco Use  ? Smoking status: Every Day  ?  Types: Cigars  ?Substance Use Topics  ? Alcohol use: Not Currently  ?  Alcohol/week: 1.0 standard drink  ?  Types: 1 Standard drinks or equivalent per week  ? Drug use: Never  ? ?  ?Family History:   ?Family History  ?Problem Relation Age of Onset  ? Heart disease Maternal Uncle   ? Pancreatic cancer Maternal Uncle   ? Cancer Maternal Aunt   ?  ? ?ROS:  ?Please see the history of present illness.  All other ROS reviewed and negative.    ? ?Physical Exam/Data:  ? ?Vitals:  ? 08/05/21 1000 08/05/21  1030 08/05/21 1100 08/05/21 1235  ?BP: (!) 159/106 (!) 164/89 (!) 158/105 (!) 154/111  ?Pulse: 91 88 93 93  ?Resp: (!) 24 (!) 31 (!) 28 18  ?Temp:    98.3 ?F (36.8 ?C)  ?TempSrc:      ?SpO2: 98% 95% 99% 98%  ?Weight:      ?Height:      ? ? ?Intake/Output Summary (Last 24 hours) at 08/05/2021 1552 ?Last data filed at 08/05/2021 0410 ?Gross per 24 hour  ?Intake --  ?Output 425 ml  ?Net -425 ml  ? ?Last 3 Weights 08/04/2021  ?Weight (lbs) 223 lb  ?Weight (kg) 101.152 kg  ?   ?Body mass index is 32 kg/m?.  ?General:  Well nourished, well developed, in no acute distress. ?HEENT: normal ?Neck: no JVD ?Vascular: No carotid bruits; Distal pulses 2+ bilaterally ?Cardiac:   normal S1, S2; RRR; no murmurs, rubs, or gallops ?Lungs:  clear to auscultation bilaterally, no wheezing, rhonchi or rales  ?Abd: soft, nontender, no hepatomegaly  ?Ext: no edema ?Musculoskeletal:  No deformities, BUE and BLE strength normal and equal ?Skin: warm and dry  ?Neuro:  CNs 2-12 intact, no focal abnormalities noted ?Psych:  Normal affect  ? ?EKG:  The EKG was personally reviewed and demonstrates: Sinus tachycardia with biatrial enlargement, LVH, and abnormal repolarization. ?Telemetry:  Telemetry was personally reviewed and demonstrates: Sinus rhythm. ? ?Relevant CV Studies: ? ?Echo 08/05/21 ? 1. Left ventricular ejection fraction, by estimation, is 30 to 35%. The  ?left ventricle has moderately decreased function. The left ventricle  ?demonstrates global hypokinesis. There is moderate left ventricular  ?hypertrophy. Left ventricular diastolic  ?parameters are consistent with Grade II diastolic dysfunction  ?(pseudonormalization).  ? 2. Right ventricular systolic function is normal. The right ventricular  ?size is normal. Tricuspid regurgitation signal is inadequate for assessing  ?PA pressure.  ? 3. Left atrial size was mildly dilated.  ? 4. The mitral valve is abnormal. Mild to moderate mitral valve  ?regurgitation. No evidence of mitral stenosis.  ? 5. The aortic valve has an indeterminant number of cusps. Aortic valve  ?regurgitation is not visualized. No aortic stenosis is present.  ? 6. The inferior vena cava is dilated in size with <50% respiratory  ?variability, suggesting right atrial pressure of 15 mmHg.  ? ?Laboratory Data: ? ?High Sensitivity Troponin:   ?Recent Labs  ?Lab 08/04/21 ?1050 08/04/21 ?1742  ?TROPONINIHS 89* 80*  ?   ?Chemistry ?Recent Labs  ?Lab 08/04/21 ?1050 08/05/21 ?7893  ?NA 138 141  ?K 3.9 3.7  ?CL 100 104  ?CO2 30 27  ?GLUCOSE 144* 116*  ?BUN 16 29*  ?CREATININE 1.29* 1.46*  ?CALCIUM 9.1 8.9  ?MG  --  2.1  ?GFRNONAA >60 58*  ?ANIONGAP 8 10  ?  ?No results for input(s): PROT,  ALBUMIN, AST, ALT, ALKPHOS, BILITOT in the last 168 hours. ?Lipids  ?Recent Labs  ?Lab 08/05/21 ?8101  ?CHOL 207*  ?TRIG 196*  ?HDL 34*  ?LDLCALC 134*  ?CHOLHDL 6.1  ?  ?Hematology ?Recent Labs  ?Lab 08/04/21 ?1050 08/05/21 ?7510  ?WBC 7.2 7.8  ?RBC 5.44 5.17  ?HGB 15.4 14.8  ?HCT 46.0 43.2  ?MCV 84.6 83.6  ?MCH 28.3 28.6  ?MCHC 33.5 34.3  ?RDW 14.6 14.5  ?PLT 185 185  ? ?Thyroid  ?Recent Labs  ?Lab 08/04/21 ?1742  ?TSH 0.258*  ?  ?BNP ?Recent Labs  ?Lab 08/04/21 ?1050  ?BNP 559.8*  ?  ?DDimer No results for input(s): DDIMER  in the last 168 hours. ? ? ?Radiology/Studies:  ?DG Chest 2 View ? ?Result Date: 08/04/2021 ?CLINICAL DATA:  A 51 year old male presents for evaluation of shortness of breath. EXAM: CHEST - 2 VIEW COMPARISON:  June 11, 2010. FINDINGS: EKG leads project over the chest. Trachea midline. Cardiomediastinal contours and hilar structures with cardiac enlargement and central pulmonary vascular engorgement. Subtle opacity in the RIGHT upper chest. Generalized interstitial prominence, question early edema. No sign of pleural effusion. On limited assessment there is no acute skeletal process. IMPRESSION: 1. Cardiomegaly with central pulmonary vascular engorgement and interstitial prominence, question early edema mild asymmetry raising the question of infection. 2. No effusion. 3. Subtle opacity in the RIGHT upper chest, may represent developing infection, small nodule in this area is not excluded, in the acute setting this remains of uncertain significance. Suggest follow-up PA and lateral chest radiograph following therapy in 6-8 weeks to ensure resolution. Electronically Signed   By: Zetta Bills M.D.   On: 08/04/2021 11:58  ? ?CT Angio Chest PE W/Cm &/Or Wo Cm ? ?Result Date: 08/04/2021 ?CLINICAL DATA:  PE suspected, high probability. EXAM: CT ANGIOGRAPHY CHEST WITH CONTRAST TECHNIQUE: Multidetector CT imaging of the chest was performed using the standard protocol during bolus administration of  intravenous contrast. Multiplanar CT image reconstructions and MIPs were obtained to evaluate the vascular anatomy. RADIATION DOSE REDUCTION: This exam was performed according to the departmental dose-optimiza

## 2021-08-05 NOTE — ED Notes (Signed)
Informed RN bed assigned 

## 2021-08-05 NOTE — Assessment & Plan Note (Signed)
Patient with history of an ostomy ?

## 2021-08-05 NOTE — Assessment & Plan Note (Signed)
Continue Rocephin and Zithromax at this point.  Continue prednisone.  Continue nebulizer treatments ?

## 2021-08-05 NOTE — Progress Notes (Signed)
?Progress Note ? ? ?Patient: Clarence [name]:[id number] DOB: 10/15/1970 DOA: 08/04/2021     1 ?DOS: the patient was seen and examined on 08/05/2021 ?  ?Brief hospital course: ?51 year old man with past medical history of Crohn's disease with ostomy and hypertension presents with shortness of breath especially with exertion.  Also has noticed some lower extremity swelling and orthopnea.  Patient was admitted to the hospital and treatment was started for CHF and pneumonia. ? ?Assessment and Plan: ?* Acute systolic congestive heart failure ([place]) ?Patient given a dose of Lasix this morning and will make Lasix twice daily dosing.  Add a low-dose of Coreg.  EF 30 to 35% on echocardiogram.  We will probably have to restart ACE inhibitor but will hold for right now secondary to increasing creatinine after CT scan.  Case discussed with cardiology to consult. ? ?Atypical pneumonia ?Continue Rocephin and Zithromax at this point.  Continue prednisone.  Continue nebulizer treatments ? ?Essential hypertension ?Holding lisinopril HCT for right now.  We will start low-dose Coreg.  Continue IV Lasix. ? ?Crohn's disease ([place]) ?Patient with history of an ostomy ? ?CKD (chronic kidney disease) stage 2, GFR 60-89 ml/min ?Watch creatinine closely with diuresis.  Creatinine 1.29 on presentation and was up to 1.46 today.  Continue to monitor closely. ? ?Elevated troponin ?Likely demand ischemia from CHF/pneumonia ? ? ? ? ?  ? ?Subjective: Patient is noticed some swelling in his ankles.  Patient states he gets short of breath when he walks to the bathroom.  Came in with shortness of breath and feels a little bit better than when he came in. ? ?Physical Exam: ?Vitals:  ? 08/05/21 0930 08/05/21 1000 08/05/21 1030 08/05/21 1100  ?BP: (!) 144/88 (!) 159/106 (!) 164/89 (!) 158/105  ?Pulse: 86 91 88 93  ?Resp: (!) 24 (!) 24 (!) 31 (!) 28  ?Temp:      ?TempSrc:      ?SpO2: 96% 98% 95% 99%  ?Weight:      ?Height:      ? ?Physical Exam ?HENT:  ?    Head: Normocephalic.  ?   Mouth/Throat:  ?   Pharynx: No oropharyngeal exudate.  ?Eyes:  ?   General: Lids are normal.  ?   Conjunctiva/sclera: Conjunctivae normal.  ?Cardiovascular:  ?   Rate and Rhythm: Normal rate and regular rhythm.  ?   Heart sounds: Normal heart sounds, S1 normal and S2 normal.  ?Pulmonary:  ?   Breath sounds: Examination of the right-middle field reveals decreased breath sounds and wheezing. Examination of the left-middle field reveals decreased breath sounds and wheezing. Examination of the right-lower field reveals decreased breath sounds and wheezing. Examination of the left-lower field reveals decreased breath sounds and wheezing. Decreased breath sounds and wheezing present.  ?Abdominal:  ?   Palpations: Abdomen is soft.  ?   Tenderness: There is no abdominal tenderness.  ?Musculoskeletal:  ?   Right lower leg: Swelling present.  ?   Left lower leg: Swelling present.  ?Skin: ?   General: Skin is warm.  ?   Findings: No rash.  ?Neurological:  ?   Mental Status: He is alert and oriented to person, place, and time.  ?  ?Data Reviewed: ?Today's creatinine up to 1.46, LDL 134, CBC normal range, procalcitonin on the lower side at 0.14. ? ?Family Communication: Declined ? ?Disposition: ?Status is: Inpatient ?Remains inpatient appropriate because: Being treated for acute systolic congestive heart failure requiring IV Lasix ? ?Planned Discharge  Destination: Home ? ? ?Author: ?Loletha Grayer, MD ?08/05/2021 12:21 PM ? ?For on call review [email or web address].  ?

## 2021-08-05 NOTE — Assessment & Plan Note (Signed)
Watch creatinine closely with diuresis.  Creatinine 1.29 on presentation and was up to 1.46 today.  Continue to monitor closely. ?

## 2021-08-05 NOTE — ED Notes (Signed)
Patient AOX4. Resp even, unlabored on RA. Denies pain or shortness of breath. Denies needs at this time.  ?

## 2021-08-05 NOTE — Hospital Course (Signed)
51 year old man with past medical history of Crohn's disease with ostomy and hypertension presents with shortness of breath especially with exertion.  Also has noticed some lower extremity swelling and orthopnea.  Patient was admitted to the hospital and treatment was started for CHF and pneumonia. ?

## 2021-08-05 NOTE — Assessment & Plan Note (Signed)
Likely demand ischemia from CHF/pneumonia ?

## 2021-08-05 NOTE — Progress Notes (Signed)
Admission profile updated. ?

## 2021-08-06 ENCOUNTER — Other Ambulatory Visit (HOSPITAL_COMMUNITY): Payer: Self-pay

## 2021-08-06 DIAGNOSIS — J189 Pneumonia, unspecified organism: Secondary | ICD-10-CM | POA: Diagnosis not present

## 2021-08-06 DIAGNOSIS — I5021 Acute systolic (congestive) heart failure: Secondary | ICD-10-CM | POA: Diagnosis not present

## 2021-08-06 DIAGNOSIS — E669 Obesity, unspecified: Secondary | ICD-10-CM

## 2021-08-06 LAB — BASIC METABOLIC PANEL
Anion gap: 8 (ref 5–15)
BUN: 30 mg/dL — ABNORMAL HIGH (ref 6–20)
CO2: 27 mmol/L (ref 22–32)
Calcium: 8.7 mg/dL — ABNORMAL LOW (ref 8.9–10.3)
Chloride: 105 mmol/L (ref 98–111)
Creatinine, Ser: 1.33 mg/dL — ABNORMAL HIGH (ref 0.61–1.24)
GFR, Estimated: >60 mL/min (ref >60)
Glucose, Bld: 125 mg/dL — ABNORMAL HIGH (ref 70–99)
Potassium: 4.1 mmol/L (ref 3.5–5.1)
Sodium: 140 mmol/L (ref 135–145)

## 2021-08-06 LAB — MAGNESIUM: Magnesium: 2.3 mg/dL (ref 1.7–2.4)

## 2021-08-06 MED ORDER — SODIUM CHLORIDE 0.9 % IV SOLN: 250.0000 mL | INTRAVENOUS | Status: DC | PRN

## 2021-08-06 MED ORDER — GUAIFENESIN ER 600 MG PO TB12
1200.0000 mg | ORAL_TABLET | Freq: Two times a day (BID) | ORAL | Status: DC
  Administered 2021-08-06 – 2021-08-10 (×9): 1200 mg via ORAL
  Filled 2021-08-06 (×9): qty 2

## 2021-08-06 MED ORDER — METHYLPREDNISOLONE SODIUM SUCC 40 MG IJ SOLR
40.0000 mg | INTRAMUSCULAR | Status: DC
  Administered 2021-08-06 – 2021-08-08 (×3): 40 mg via INTRAVENOUS
  Filled 2021-08-06 (×3): qty 1

## 2021-08-06 MED ORDER — PHENOL 1.4 % MT LIQD
1.0000 | OROMUCOSAL | Status: DC | PRN
  Filled 2021-08-06: qty 177

## 2021-08-06 MED ORDER — DAPAGLIFLOZIN PROPANEDIOL 5 MG PO TABS
10.0000 mg | ORAL_TABLET | Freq: Every day | ORAL | Status: DC
  Administered 2021-08-06 – 2021-08-10 (×4): 10 mg via ORAL
  Filled 2021-08-06: qty 1
  Filled 2021-08-06 (×5): qty 2

## 2021-08-06 MED ORDER — ENOXAPARIN SODIUM 60 MG/0.6ML IJ SOSY
0.5000 mg/kg | PREFILLED_SYRINGE | Freq: Every day | INTRAMUSCULAR | Status: DC
  Administered 2021-08-06 – 2021-08-09 (×4): 47.5 mg via SUBCUTANEOUS
  Filled 2021-08-06 (×4): qty 0.6

## 2021-08-06 MED ORDER — SODIUM CHLORIDE 0.9 % IV SOLN: INTRAVENOUS | Status: DC

## 2021-08-06 MED ORDER — SODIUM CHLORIDE 0.9% FLUSH
3.0000 mL | Freq: Two times a day (BID) | INTRAVENOUS | Status: DC
  Administered 2021-08-06: 3 mL via INTRAVENOUS

## 2021-08-06 MED ORDER — ASPIRIN 81 MG PO CHEW
81.0000 mg | CHEWABLE_TABLET | ORAL | Status: AC
  Administered 2021-08-07: 81 mg via ORAL
  Filled 2021-08-06: qty 1

## 2021-08-06 MED ORDER — SODIUM CHLORIDE 0.9% FLUSH: 3.0000 mL | INTRAVENOUS | Status: DC | PRN

## 2021-08-06 MED ORDER — HYDROCOD POLI-CHLORPHE POLI ER 10-8 MG/5ML PO SUER
5.0000 mL | Freq: Two times a day (BID) | ORAL | Status: DC | PRN
  Administered 2021-08-06 – 2021-08-10 (×3): 5 mL via ORAL
  Filled 2021-08-06 (×3): qty 5

## 2021-08-06 NOTE — H&P (View-Only) (Signed)
? ?Cardiology Progress Note  ? ?Patient Name: Clarence [name] ?Date of Encounter: 08/06/2021 ? ?Primary Cardiologist: Nelva Bush, MD ? ?Subjective  ? ?Bothered by ongoing cough w/ thick sputum.  Feels like things have gone in reverse a bit - feels as bad this AM as he felt on admission.  No orthopnea. ? ?Inpatient Medications  ?  ?Scheduled Meds: ? albuterol  2.5 mg Nebulization TID  ? aspirin EC  81 mg Oral Daily  ? azithromycin  250 mg Oral Daily  ? carvedilol  3.125 mg Oral BID WC  ? enoxaparin (LOVENOX) injection  50 mg Subcutaneous QHS  ? furosemide  40 mg Intravenous BID  ? potassium chloride  20 mEq Oral Daily  ? predniSONE  40 mg Oral Q breakfast  ? ?Continuous Infusions: ? cefTRIAXone (ROCEPHIN)  IV Stopped (08/05/21 0955)  ? ?PRN Meds: ?guaiFENesin-dextromethorphan, labetalol, ondansetron  ? ?Vital Signs  ?  ?Vitals:  ? 08/05/21 1955 08/05/21 2007 08/05/21 2321 08/06/21 0240  ?BP: (!) 151/104  (!) 154/109 (!) 144/105  ?Pulse: 83  92 80  ?Resp: 18  18 20   ?Temp: 98.1 ?F (36.7 ?C)  97.8 ?F (36.6 ?C) 98.1 ?F (36.7 ?C)  ?TempSrc:      ?SpO2: 97% 97% 98% 97%  ?Weight:      ?Height:      ? ? ?Intake/Output Summary (Last 24 hours) at 08/06/2021 0801 ?Last data filed at 08/05/2021 1900 ?Gross per 24 hour  ?Intake 573.26 ml  ?Output 1200 ml  ?Net -626.74 ml  ? ?Filed Weights  ? 08/04/21 1045  ?Weight: 101.2 kg  ? ? ?Physical Exam  ? ?GEN: Well nourished, well developed, in no acute distress.  ?HEENT: Grossly normal.  ?Neck: Supple, mod elev JVP, no carotid bruits, or masses. ?Cardiac: RRR, no murmurs, rubs, or gallops. No clubbing, cyanosis, edema.  Radials 2+, DP/PT 2+ and equal bilaterally.  ?Respiratory:  Respirations regular and unlabored, frequent cough, coarse breath sounds throughout w/ diffuse insp/exp wheezing. ?GI: Soft, nontender, nondistended, BS + x 4.  R sided colostomy. ?MS: no deformity or atrophy. ?Skin: warm and dry, no rash. ?Neuro:  Strength and sensation are intact. ?Psych: AAOx3.  Normal  affect. ? ?Labs  ?  ?Chemistry ?Recent Labs  ?Lab 08/04/21 ?1050 08/05/21 ?1224 08/06/21 ?0427  ?NA 138 141 140  ?K 3.9 3.7 4.1  ?CL 100 104 105  ?CO2 30 27 27   ?GLUCOSE 144* 116* 125*  ?BUN 16 29* 30*  ?CREATININE 1.29* 1.46* 1.33*  ?CALCIUM 9.1 8.9 8.7*  ?GFRNONAA >60 58* >60  ?ANIONGAP 8 10 8   ?  ? ?Hematology ?Recent Labs  ?Lab 08/04/21 ?1050 08/05/21 ?8250  ?WBC 7.2 7.8  ?RBC 5.44 5.17  ?HGB 15.4 14.8  ?HCT 46.0 43.2  ?MCV 84.6 83.6  ?MCH 28.3 28.6  ?MCHC 33.5 34.3  ?RDW 14.6 14.5  ?PLT 185 185  ? ? ?Cardiac Enzymes  ?Recent Labs  ?Lab 08/04/21 ?1050 08/04/21 ?1742  ?TROPONINIHS 89* 80*  ?   ? ?BNPBNP ?   ?Component Value Date/Time  ? BNP 559.8 (H) 08/04/2021 1050  ? ?Lipids  ?Lab Results  ?Component Value Date  ? CHOL 207 (H) 08/05/2021  ? HDL 34 (L) 08/05/2021  ? LDLCALC 134 (H) 08/05/2021  ? TRIG 196 (H) 08/05/2021  ? CHOLHDL 6.1 08/05/2021  ? ? ?HbA1c  ?Lab Results  ?Component Value Date  ? HGBA1C 5.4 08/05/2021  ? ? ?Radiology  ?  ?DG Chest 2 View ? ?Result Date: 08/04/2021 ?  CLINICAL DATA:  A 51 year old male presents for evaluation of shortness of breath. EXAM: CHEST - 2 VIEW COMPARISON:  June 11, 2010. FINDINGS: EKG leads project over the chest. Trachea midline. Cardiomediastinal contours and hilar structures with cardiac enlargement and central pulmonary vascular engorgement. Subtle opacity in the RIGHT upper chest. Generalized interstitial prominence, question early edema. No sign of pleural effusion. On limited assessment there is no acute skeletal process. IMPRESSION: 1. Cardiomegaly with central pulmonary vascular engorgement and interstitial prominence, question early edema mild asymmetry raising the question of infection. 2. No effusion. 3. Subtle opacity in the RIGHT upper chest, may represent developing infection, small nodule in this area is not excluded, in the acute setting this remains of uncertain significance. Suggest follow-up PA and lateral chest radiograph following therapy in 6-8  weeks to ensure resolution. Electronically Signed   By: Zetta Bills M.D.   On: 08/04/2021 11:58  ? ?CT Angio Chest PE W/Cm &/Or Wo Cm ? ?Result Date: 08/04/2021 ?CLINICAL DATA:  PE suspected, high probability. EXAM: CT ANGIOGRAPHY CHEST WITH CONTRAST TECHNIQUE: Multidetector CT imaging of the chest was performed using the standard protocol during bolus administration of intravenous contrast. Multiplanar CT image reconstructions and MIPs were obtained to evaluate the vascular anatomy. RADIATION DOSE REDUCTION: This exam was performed according to the departmental dose-optimization program which includes automated exposure control, adjustment of the mA and/or kV according to patient size and/or use of iterative reconstruction technique. CONTRAST:  110m OMNIPAQUE IOHEXOL 350 MG/ML SOLN COMPARISON:  Chest radiograph June 11, 2010 FINDINGS: Cardiovascular: Satisfactory opacification of the pulmonary arteries to the segmental level. No evidence of pulmonary embolism. Normal caliber thoracic aorta. Cardiomegaly. No significant pericardial effusion/thickening. Mediastinum/Nodes: Mildly enlarged/prominent mediastinal and hilar lymph nodes. For reference: -Left paratracheal lymph node measures 1 cm in short axis on image 22/4 -Prevascular lymph node measures 1 cm in short axis on image 39/4 -Right hilar lymph node measures 12 mm in short axis on image 57/4 Lungs/Pleura: Multifocal areas of clustered nodularity in the right lung for instance in the right upper lobe on image 38/6 and in the paramedian right lower lobe best seen on coronal image 66/7. Mild interstitial thickening with adjacent ground-glass opacities in the lung bases likely reflects mild pulmonary edema. No pleural effusion. No pneumothorax. Upper Abdomen: Mild splenomegaly measuring 15.8 cm in maximum axial dimension, slightly increased from CT February 06, 2020. Musculoskeletal: No chest wall abnormality. No acute or significant osseous findings. Review  of the MIP images confirms the above findings. IMPRESSION: 1. No evidence of pulmonary embolism. 2. Multifocal areas of clustered nodularity in the right lung, likely reflecting an infectious or inflammatory process. Recommend follow-up CT in 1-3 months to ensure resolution. 3. Cardiomegaly with mild interstitial thickening with adjacent ground-glass opacities in the lung bases likely reflects mild pulmonary edema. 4. Mildly enlarged/prominent mediastinal and hilar lymph nodes, likely reactive. Attention on follow-up imaging suggested. 5. Mild splenomegaly, slightly increased from CT February 06, 2020, and of indeterminate clinical significance. Electronically Signed   By: JDahlia BailiffM.D.   On: 08/04/2021 12:55  ? ?Telemetry  ?  ?RSR 80-90 - Personally Reviewed ? ?Cardiac Studies  ? ?Echo 08/05/21 ? ? 1. Left ventricular ejection fraction, by estimation, is 30 to 35%. The  ?left ventricle has moderately decreased function. The left ventricle  ?demonstrates global hypokinesis. There is moderate left ventricular  ?hypertrophy. Left ventricular diastolic  ?parameters are consistent with Grade II diastolic dysfunction  ?(pseudonormalization).  ? 2. Right ventricular systolic function  is normal. The right ventricular  ?size is normal. Tricuspid regurgitation signal is inadequate for assessing  ?PA pressure.  ? 3. Left atrial size was mildly dilated.  ? 4. The mitral valve is abnormal. Mild to moderate mitral valve  ?regurgitation. No evidence of mitral stenosis.  ? 5. The aortic valve has an indeterminant number of cusps. Aortic valve  ?regurgitation is not visualized. No aortic stenosis is present.  ? 6. The inferior vena cava is dilated in size with <50% respiratory  ?variability, suggesting right atrial pressure of 15 mmHg.  ?_____________  ? ?Patient Profile  ?   ?51 y.o. male w/ a h/o Crohn's dzs s/p colectomy w Oneta Rack and HTN, who was admitted 3/20 w/ progressive dyspnea, and found to have an EF of 30-35% w/  atypical PNA. ? ?Assessment & Plan  ?  ?1.  Acute respiratory failure/atypical PNA:  Biggest complaint this AM is ongoing cough and congestion.  Rhonchi and wheezing notable on exam.  Abx, steroids, nebs/

## 2021-08-06 NOTE — Plan of Care (Signed)
  Problem: Education: Goal: Ability to demonstrate management of disease process will improve Outcome: Progressing Goal: Ability to verbalize understanding of medication therapies will improve Outcome: Progressing   Problem: Activity: Goal: Capacity to carry out activities will improve Outcome: Progressing   Problem: Cardiac: Goal: Ability to achieve and maintain adequate cardiopulmonary perfusion will improve Outcome: Progressing   

## 2021-08-06 NOTE — Progress Notes (Addendum)
? ?Cardiology Progress Note  ? ?Patient Name: STACI CARVER ?Date of Encounter: 08/06/2021 ? ?Primary Cardiologist: Nelva Bush, MD ? ?Subjective  ? ?Bothered by ongoing cough w/ thick sputum.  Feels like things have gone in reverse a bit - feels as bad this AM as he felt on admission.  No orthopnea. ? ?Inpatient Medications  ?  ?Scheduled Meds: ? albuterol  2.5 mg Nebulization TID  ? aspirin EC  81 mg Oral Daily  ? azithromycin  250 mg Oral Daily  ? carvedilol  3.125 mg Oral BID WC  ? enoxaparin (LOVENOX) injection  50 mg Subcutaneous QHS  ? furosemide  40 mg Intravenous BID  ? potassium chloride  20 mEq Oral Daily  ? predniSONE  40 mg Oral Q breakfast  ? ?Continuous Infusions: ? cefTRIAXone (ROCEPHIN)  IV Stopped (08/05/21 0955)  ? ?PRN Meds: ?guaiFENesin-dextromethorphan, labetalol, ondansetron  ? ?Vital Signs  ?  ?Vitals:  ? 08/05/21 1955 08/05/21 2007 08/05/21 2321 08/06/21 0240  ?BP: (!) 151/104  (!) 154/109 (!) 144/105  ?Pulse: 83  92 80  ?Resp: 18  18 20   ?Temp: 98.1 ?F (36.7 ?C)  97.8 ?F (36.6 ?C) 98.1 ?F (36.7 ?C)  ?TempSrc:      ?SpO2: 97% 97% 98% 97%  ?Weight:      ?Height:      ? ? ?Intake/Output Summary (Last 24 hours) at 08/06/2021 0801 ?Last data filed at 08/05/2021 1900 ?Gross per 24 hour  ?Intake 573.26 ml  ?Output 1200 ml  ?Net -626.74 ml  ? ?Filed Weights  ? 08/04/21 1045  ?Weight: 101.2 kg  ? ? ?Physical Exam  ? ?GEN: Well nourished, well developed, in no acute distress.  ?HEENT: Grossly normal.  ?Neck: Supple, mod elev JVP, no carotid bruits, or masses. ?Cardiac: RRR, no murmurs, rubs, or gallops. No clubbing, cyanosis, edema.  Radials 2+, DP/PT 2+ and equal bilaterally.  ?Respiratory:  Respirations regular and unlabored, frequent cough, coarse breath sounds throughout w/ diffuse insp/exp wheezing. ?GI: Soft, nontender, nondistended, BS + x 4.  R sided colostomy. ?MS: no deformity or atrophy. ?Skin: warm and dry, no rash. ?Neuro:  Strength and sensation are intact. ?Psych: AAOx3.  Normal  affect. ? ?Labs  ?  ?Chemistry ?Recent Labs  ?Lab 08/04/21 ?1050 08/05/21 ?3748 08/06/21 ?0427  ?NA 138 141 140  ?K 3.9 3.7 4.1  ?CL 100 104 105  ?CO2 30 27 27   ?GLUCOSE 144* 116* 125*  ?BUN 16 29* 30*  ?CREATININE 1.29* 1.46* 1.33*  ?CALCIUM 9.1 8.9 8.7*  ?GFRNONAA >60 58* >60  ?ANIONGAP 8 10 8   ?  ? ?Hematology ?Recent Labs  ?Lab 08/04/21 ?1050 08/05/21 ?2707  ?WBC 7.2 7.8  ?RBC 5.44 5.17  ?HGB 15.4 14.8  ?HCT 46.0 43.2  ?MCV 84.6 83.6  ?MCH 28.3 28.6  ?MCHC 33.5 34.3  ?RDW 14.6 14.5  ?PLT 185 185  ? ? ?Cardiac Enzymes  ?Recent Labs  ?Lab 08/04/21 ?1050 08/04/21 ?1742  ?TROPONINIHS 89* 80*  ?   ? ?BNPBNP ?   ?Component Value Date/Time  ? BNP 559.8 (H) 08/04/2021 1050  ? ?Lipids  ?Lab Results  ?Component Value Date  ? CHOL 207 (H) 08/05/2021  ? HDL 34 (L) 08/05/2021  ? LDLCALC 134 (H) 08/05/2021  ? TRIG 196 (H) 08/05/2021  ? CHOLHDL 6.1 08/05/2021  ? ? ?HbA1c  ?Lab Results  ?Component Value Date  ? HGBA1C 5.4 08/05/2021  ? ? ?Radiology  ?  ?DG Chest 2 View ? ?Result Date: 08/04/2021 ?  CLINICAL DATA:  A 51 year old male presents for evaluation of shortness of breath. EXAM: CHEST - 2 VIEW COMPARISON:  June 11, 2010. FINDINGS: EKG leads project over the chest. Trachea midline. Cardiomediastinal contours and hilar structures with cardiac enlargement and central pulmonary vascular engorgement. Subtle opacity in the RIGHT upper chest. Generalized interstitial prominence, question early edema. No sign of pleural effusion. On limited assessment there is no acute skeletal process. IMPRESSION: 1. Cardiomegaly with central pulmonary vascular engorgement and interstitial prominence, question early edema mild asymmetry raising the question of infection. 2. No effusion. 3. Subtle opacity in the RIGHT upper chest, may represent developing infection, small nodule in this area is not excluded, in the acute setting this remains of uncertain significance. Suggest follow-up PA and lateral chest radiograph following therapy in 6-8  weeks to ensure resolution. Electronically Signed   By: Zetta Bills M.D.   On: 08/04/2021 11:58  ? ?CT Angio Chest PE W/Cm &/Or Wo Cm ? ?Result Date: 08/04/2021 ?CLINICAL DATA:  PE suspected, high probability. EXAM: CT ANGIOGRAPHY CHEST WITH CONTRAST TECHNIQUE: Multidetector CT imaging of the chest was performed using the standard protocol during bolus administration of intravenous contrast. Multiplanar CT image reconstructions and MIPs were obtained to evaluate the vascular anatomy. RADIATION DOSE REDUCTION: This exam was performed according to the departmental dose-optimization program which includes automated exposure control, adjustment of the mA and/or kV according to patient size and/or use of iterative reconstruction technique. CONTRAST:  17m OMNIPAQUE IOHEXOL 350 MG/ML SOLN COMPARISON:  Chest radiograph June 11, 2010 FINDINGS: Cardiovascular: Satisfactory opacification of the pulmonary arteries to the segmental level. No evidence of pulmonary embolism. Normal caliber thoracic aorta. Cardiomegaly. No significant pericardial effusion/thickening. Mediastinum/Nodes: Mildly enlarged/prominent mediastinal and hilar lymph nodes. For reference: -Left paratracheal lymph node measures 1 cm in short axis on image 22/4 -Prevascular lymph node measures 1 cm in short axis on image 39/4 -Right hilar lymph node measures 12 mm in short axis on image 57/4 Lungs/Pleura: Multifocal areas of clustered nodularity in the right lung for instance in the right upper lobe on image 38/6 and in the paramedian right lower lobe best seen on coronal image 66/7. Mild interstitial thickening with adjacent ground-glass opacities in the lung bases likely reflects mild pulmonary edema. No pleural effusion. No pneumothorax. Upper Abdomen: Mild splenomegaly measuring 15.8 cm in maximum axial dimension, slightly increased from CT February 06, 2020. Musculoskeletal: No chest wall abnormality. No acute or significant osseous findings. Review  of the MIP images confirms the above findings. IMPRESSION: 1. No evidence of pulmonary embolism. 2. Multifocal areas of clustered nodularity in the right lung, likely reflecting an infectious or inflammatory process. Recommend follow-up CT in 1-3 months to ensure resolution. 3. Cardiomegaly with mild interstitial thickening with adjacent ground-glass opacities in the lung bases likely reflects mild pulmonary edema. 4. Mildly enlarged/prominent mediastinal and hilar lymph nodes, likely reactive. Attention on follow-up imaging suggested. 5. Mild splenomegaly, slightly increased from CT February 06, 2020, and of indeterminate clinical significance. Electronically Signed   By: JDahlia BailiffM.D.   On: 08/04/2021 12:55  ? ?Telemetry  ?  ?RSR 80-90 - Personally Reviewed ? ?Cardiac Studies  ? ?Echo 08/05/21 ? ? 1. Left ventricular ejection fraction, by estimation, is 30 to 35%. The  ?left ventricle has moderately decreased function. The left ventricle  ?demonstrates global hypokinesis. There is moderate left ventricular  ?hypertrophy. Left ventricular diastolic  ?parameters are consistent with Grade II diastolic dysfunction  ?(pseudonormalization).  ? 2. Right ventricular systolic function  is normal. The right ventricular  ?size is normal. Tricuspid regurgitation signal is inadequate for assessing  ?PA pressure.  ? 3. Left atrial size was mildly dilated.  ? 4. The mitral valve is abnormal. Mild to moderate mitral valve  ?regurgitation. No evidence of mitral stenosis.  ? 5. The aortic valve has an indeterminant number of cusps. Aortic valve  ?regurgitation is not visualized. No aortic stenosis is present.  ? 6. The inferior vena cava is dilated in size with <50% respiratory  ?variability, suggesting right atrial pressure of 15 mmHg.  ?_____________  ? ?Patient Profile  ?   ?51 y.o. male w/ a h/o Crohn's dzs s/p colectomy w Oneta Rack and HTN, who was admitted 3/20 w/ progressive dyspnea, and found to have an EF of 30-35% w/  atypical PNA. ? ?Assessment & Plan  ?  ?1.  Acute respiratory failure/atypical PNA:  Biggest complaint this AM is ongoing cough and congestion.  Rhonchi and wheezing notable on exam.  Abx, steroids, nebs/

## 2021-08-06 NOTE — Evaluation (Addendum)
Physical Therapy Evaluation ?Patient Details ?Name: Clarence [name] ?MRN: [id number] ?DOB: 01-02-1971 ?Today's Date: 08/06/2021 ? ?History of Present Illness ? Pt is a 51 y.o. male who presents to the ED complaining of SOB (3/20). He reports he has been dealing w/ intermittent difficulty breathing for the past 6 months, but symptoms have gotten worse over the past 2 days. Currently admitted for CHF exacerbation, elevated troponin, creatinine, glucose. PmHx: Crohn's Disease, HTN, Proteinuria, Colectomy. ?  ?Clinical Impression ? Pt is awake and alert resting in bed upon PT entrance into room for evaluation today; he is cleared to work w/ PT by cardiologist this afternoon. He is A&Ox4 and denies any c/o pain at rest. He reports prior to hospitalization, he lives in a 1-story home w/ 2 STE and was independent w/ all ADLs. He states his parents are the only other family that lives w/ him. ? ?Pt was able to complete all OOB bed mobility w/ modI prior to continuation of today's session. He was able to ambulate ~161f w/ SUPERVISION, not required, but provided for monitoring of SpO2 throughout ambulation. SpO2 dropped to 89 w/ ambulation and Pt was educated on the importance of pursed-lip breathing to decrease SOB and improve SpO2 w/ mobility. Pt returned to recliner w/ all needs w/in reach and SpO2 was 93% prior to PT exiting room. Pt will benefit from continued skilled PT in order to increase endurance, improve mobility/gait, and restore PLOF. Current discharge recommendation to Outpatient PT is appropriate due to the level of assistance required by the patient to ensure safety and improve overall function. ?   ?   ? ?Recommendations for follow up therapy are one component of a multi-disciplinary discharge planning process, led by the attending physician.  Recommendations may be updated based on patient status, additional functional criteria and insurance authorization. ? ?Follow Up Recommendations Outpatient PT ? ?   ?Assistance Recommended at Discharge PRN  ?Patient can return home with the following ? A little help with walking and/or transfers;A little help with bathing/dressing/bathroom;Assistance with cooking/housework;Assist for transportation;Help with stairs or ramp for entrance ? ?  ?Equipment Recommendations Other (comment) (TBD)  ?Recommendations for Other Services ?    ?  ?Functional Status Assessment Patient has had a recent decline in their functional status and demonstrates the ability to make significant improvements in function in a reasonable and predictable amount of time.  ? ?  ?Precautions / Restrictions Precautions ?Precautions: None ?Restrictions ?Weight Bearing Restrictions: No  ? ?  ? ?Mobility ? Bed Mobility ?Overal bed mobility: Modified Independent ?  ?  ?  ?  ?  ?  ?  ?  ? ?Transfers ?Overall transfer level: Modified independent ?  ?  ?  ?  ?  ?  ?  ?  ?  ?  ? ?Ambulation/Gait ?Ambulation/Gait assistance: Supervision ?Gait Distance (Feet): 180 Feet ?Assistive device: None ?Gait Pattern/deviations: WFL(Within Functional Limits) ?Gait velocity: decreased ?  ?  ?General Gait Details: SpO2 dropped to 89-91 w/ ambulation; increased SOB compared to seated at rest ? ?Stairs ?  ?  ?  ?  ?  ? ?Wheelchair Mobility ?  ? ?Modified Rankin (Stroke Patients Only) ?  ? ?  ? ?Balance Overall balance assessment: Modified Independent ?  ?  ?  ?  ?  ?  ?  ?  ?  ?  ?  ?  ?  ?  ?  ?  ?  ?  ?   ? ? ? ?Pertinent Vitals/Pain  Pain Assessment ?Pain Assessment: No/denies pain  ? ? ?Home Living Family/patient expects to be discharged to:: Private residence ?Living Arrangements: Parent ?Available Help at Discharge: Family;Available PRN/intermittently ?Type of Home: House ?Home Access: Stairs to enter ?Entrance Stairs-Rails: Left;Right ?Entrance Stairs-Number of Steps: 2 ?  ?Home Layout: One level ?Home Equipment: None ?   ?  ?Prior Function Prior Level of Function : Independent/Modified Independent ?  ?  ?  ?  ?  ?  ?  ?  ?   ? ? ?Hand Dominance  ?   ? ?  ?Extremity/Trunk Assessment  ? Upper Extremity Assessment ?Upper Extremity Assessment: Overall WFL for tasks assessed ?  ? ?Lower Extremity Assessment ?Lower Extremity Assessment: Overall WFL for tasks assessed ?  ? ?   ?Communication  ?    ?Cognition Arousal/Alertness: Awake/alert ?Behavior During Therapy: [facility] for tasks assessed/performed ?Overall Cognitive Status: Within Functional Limits for tasks assessed ?  ?  ?  ?  ?  ?  ?  ?  ?  ?  ?  ?  ?  ?  ?  ?  ?  ?  ?  ? ?  ?General Comments   ? ?  ?Exercises    ? ?Assessment/Plan  ?  ?PT Assessment Patient needs continued PT services  ?PT Problem List Decreased strength;Decreased mobility;Decreased coordination;Decreased activity tolerance;Decreased balance;Cardiopulmonary status limiting activity ? ?   ?  ?PT Treatment Interventions DME instruction;Therapeutic exercise;Gait training;Balance training;Stair training;Neuromuscular re-education;Therapeutic activities;Functional mobility training;Patient/family education   ? ?PT Goals (Current goals can be found in the Care Plan section)  ?Acute Rehab PT Goals ?Patient Stated Goal: to decrease c/o SOB and to go home ?PT Goal Formulation: With patient ?Time For Goal Achievement: 08/20/21 ?Potential to Achieve Goals: Good ?Additional Goals ?Additional Goal #1: Pt will be able to ambulate 1079f without c/o SOB or negative symptoms to improve community engagement. ?Additional Goal #2: Pt will be able to complete TUG in <20 sec ? ?  ?Frequency Min 2X/week ?  ? ? ?Co-evaluation   ?  ?  ?  ?  ? ? ?  ?AM-PAC PT "6 Clicks" Mobility  ?Outcome Measure Help needed turning from your back to your side while in a flat bed without using bedrails?: None ?Help needed moving from lying on your back to sitting on the side of a flat bed without using bedrails?: None ?Help needed moving to and from a bed to a chair (including a wheelchair)?: None ?Help needed standing up from a chair using your arms (e.g.,  wheelchair or bedside chair)?: None ?Help needed to walk in hospital room?: A Little ?Help needed climbing 3-5 steps with a railing? : A Little ?6 Click Score: 22 ? ?  ?End of Session   ?Activity Tolerance: Patient tolerated treatment well;Patient limited by fatigue ?Patient left: in chair;with call bell/phone within reach ?Nurse Communication: Mobility status ?PT Visit Diagnosis: Unsteadiness on feet (R26.81) ?  ? ?Time: [address]?PT Time Calculation (min) (ACUTE ONLY): 14 min ? ? ?Charges:   PT Evaluation ?$PT Eval Low Complexity: 1 Low ?  ?  ?   ? ?AJonnie Kind SPT ?08/06/2021, 3:48 PM ? ?

## 2021-08-06 NOTE — TOC Benefit Eligibility Note (Signed)
Patient Advocate Encounter ? ?Insurance verification completed.   ? ?The patient is currently admitted and upon discharge could be taking Entresto 24-26 mg. ? ?The current 30 day co-pay is, $30.00.  ? ?The patient is insured through [employer]  ? ? ? ?Lyndel Safe, CPhT ?Pharmacy Patient Advocate Specialist ?[facility] Patient Advocate Team ?Direct Number: [phone]  Fax: [phone] ? ? ? ? ? ?  ?

## 2021-08-06 NOTE — Progress Notes (Signed)
?PROGRESS NOTE ? ? ? ?STROTHER EVERITT  PFX:[id number] DOB: [birth year] DOA: 08/04/2021 ?PCP: Lynnell Jude, MD  ? ? ?Assessment & Plan: ?  ?Principal Problem: ?  Acute systolic congestive heart failure ([place]) ?Active Problems: ?  Atypical pneumonia ?  Essential hypertension ?  Crohn's disease ([place]) ?  CKD (chronic kidney disease) stage 2, GFR 60-89 ml/min ?  Elevated troponin ? ? ?Acute systolic CHF: w/ cardiomyopathy (unknown type) as per cardio. echo shows EF 30-35%. Continue on lasix, coreg. [place] I/Os. Will go for right & left heart cath tomorrow  ?  ?Atypical pneumonia: continue on IV rocephin, azithromycin, steroids & bronchodilators. Encourage incentive spirometry & flutter valve. Started on mucinex. Chloraseptic spray prn for sore throat.  ?  ?HTN: continue on coreg. Continue to hold lisinopril, HCTZ. ?  ?Crohn's disease: w/ hx of ostomy. Continue w/ supportive care ?  ?CKDII: Cr baseline around 1.29.  Cr is trending down today  ?  ?Elevated troponin: mild & trending down. Likely secondary to demand ischemia ? ?Obesity: BMI 32.0. Would benefit from weight loss  ? ? ? ?DVT prophylaxis: lovenox  ?Code Status: full  ?Family Communication:  ?Disposition Plan: likely d/c back home  ? ?Level of care: Telemetry Cardiac ? ?Status is: Inpatient ?Remains inpatient appropriate because: will go for cardiac cath tomorrow  ? ? ? ?Consultants:  ?Cardio  ? ?Procedures:  ? ?Antimicrobials:  ? ? ?Subjective: ?Pt c/o shortness of breath & wheezing  ? ?Objective: ?Vitals:  ? 08/05/21 1955 08/05/21 2007 08/05/21 2321 08/06/21 0240  ?BP: (!) 151/104  (!) 154/109 (!) 144/105  ?Pulse: 83  92 80  ?Resp: 18  18 20   ?Temp: 98.1 ?F (36.7 ?C)  97.8 ?F (36.6 ?C) 98.1 ?F (36.7 ?C)  ?TempSrc:      ?SpO2: 97% 97% 98% 97%  ?Weight:      ?Height:      ? ? ?Intake/Output Summary (Last 24 hours) at 08/06/2021 0814 ?Last data filed at 08/06/2021 0800 ?Gross per 24 hour  ?Intake 693.26 ml  ?Output 1200 ml  ?Net -506.74 ml  ? ?Filed Weights  ?  08/04/21 1045  ?Weight: 101.2 kg  ? ? ?Examination: ? ?General exam: Appears calm and comfortable  ?Respiratory system: course breath sounds b/l. Wheezes b/l  ?Cardiovascular system: S1 & S2+. No rubs, gallops or clicks.  ?Gastrointestinal system: Abdomen is nondistended, soft and nontender. Normal bowel sounds heard. ?Central nervous system: Alert and oriented. Moves all extremities. ?Psychiatry: Judgement and insight appear normal. Mood & affect appropriate.  ? ? ? ?Data Reviewed: I have personally reviewed following labs and imaging studies ? ?CBC: ?Recent Labs  ?Lab 08/04/21 ?1050 08/05/21 ?3299  ?WBC 7.2 7.8  ?HGB 15.4 14.8  ?HCT 46.0 43.2  ?MCV 84.6 83.6  ?PLT 185 185  ? ?Basic Metabolic Panel: ?Recent Labs  ?Lab 08/04/21 ?1050 08/05/21 ?2426 08/06/21 ?0427  ?NA 138 141 140  ?K 3.9 3.7 4.1  ?CL 100 104 105  ?CO2 30 27 27   ?GLUCOSE 144* 116* 125*  ?BUN 16 29* 30*  ?CREATININE 1.29* 1.46* 1.33*  ?CALCIUM 9.1 8.9 8.7*  ?MG  --  2.1 2.3  ? ?GFR: ?Estimated Creatinine Clearance: 77.5 mL/min (A) (by C-G formula based on SCr of 1.33 mg/dL (H)). ?Liver Function Tests: ?No results for input(s): AST, ALT, ALKPHOS, BILITOT, PROT, ALBUMIN in the last 168 hours. ?No results for input(s): LIPASE, AMYLASE in the last 168 hours. ?No results for input(s): AMMONIA in the last 168 hours. ?  Coagulation Profile: ?No results for input(s): INR, PROTIME in the last 168 hours. ?Cardiac Enzymes: ?No results for input(s): CKTOTAL, CKMB, CKMBINDEX, TROPONINI in the last 168 hours. ?BNP (last 3 results) ?No results for input(s): PROBNP in the last 8760 hours. ?HbA1C: ?Recent Labs  ?  08/05/21 ?1779  ?HGBA1C 5.4  ? ?CBG: ?No results for input(s): GLUCAP in the last 168 hours. ?Lipid Profile: ?Recent Labs  ?  08/05/21 ?3903  ?CHOL 207*  ?HDL 34*  ?LDLCALC 134*  ?TRIG 196*  ?CHOLHDL 6.1  ? ?Thyroid Function Tests: ?Recent Labs  ?  08/04/21 ?1742  ?TSH 0.258*  ? ?Anemia Panel: ?No results for input(s): VITAMINB12, FOLATE, FERRITIN, TIBC, IRON,  RETICCTPCT in the last 72 hours. ?Sepsis Labs: ?Recent Labs  ?Lab 08/05/21 ?0092  ?PROCALCITON 0.14  ? ? ?Recent Results (from the past 240 hour(s))  ?Resp Panel by RT-PCR (Flu A&B, Covid) Nasopharyngeal Swab     Status: None  ? Collection Time: 08/04/21 10:50 AM  ? Specimen: Nasopharyngeal Swab; Nasopharyngeal(NP) swabs in vial transport medium  ?Result Value Ref Range Status  ? SARS Coronavirus 2 by RT PCR NEGATIVE NEGATIVE Final  ?  Comment: (NOTE) ?SARS-CoV-2 target nucleic acids are NOT DETECTED. ? ?The SARS-CoV-2 RNA is generally detectable in upper respiratory ?specimens during the acute phase of infection. The lowest ?concentration of SARS-CoV-2 viral copies this assay can detect is ?138 copies/mL. A negative result does not preclude SARS-Cov-2 ?infection and should not be used as the sole basis for treatment or ?other patient management decisions. A negative result may occur with  ?improper specimen collection/handling, submission of specimen other ?than nasopharyngeal swab, presence of viral mutation(s) within the ?areas targeted by this assay, and inadequate number of viral ?copies(<138 copies/mL). A negative result must be combined with ?clinical observations, patient history, and epidemiological ?information. The expected result is Negative. ? ?Fact Sheet for Patients:  ?EntrepreneurPulse.com.au ? ?Fact Sheet for Healthcare Providers:  ?IncredibleEmployment.be ? ?This test is no t yet approved or cleared by the [place] FDA and  ?has been authorized for detection and/or diagnosis of SARS-CoV-2 by ?FDA under an Emergency Use Authorization (EUA). This EUA will remain  ?in effect (meaning this test can be used) for the duration of the ?COVID-19 declaration under Section 564(b)(1) of the Act, 21 ?U.S.C.section 360bbb-3(b)(1), unless the authorization is terminated  ?or revoked sooner.  ? ? ?  ? Influenza A by PCR NEGATIVE NEGATIVE Final  ? Influenza B by PCR NEGATIVE  NEGATIVE Final  ?  Comment: (NOTE) ?The Xpert Xpress SARS-CoV-2/FLU/RSV plus assay is intended as an aid ?in the diagnosis of influenza from Nasopharyngeal swab specimens and ?should not be used as a sole basis for treatment. Nasal washings and ?aspirates are unacceptable for Xpert Xpress SARS-CoV-2/FLU/RSV ?testing. ? ?Fact Sheet for Patients: ?EntrepreneurPulse.com.au ? ?Fact Sheet for Healthcare Providers: ?IncredibleEmployment.be ? ?This test is not yet approved or cleared by the [place] FDA and ?has been authorized for detection and/or diagnosis of SARS-CoV-2 by ?FDA under an Emergency Use Authorization (EUA). This EUA will remain ?in effect (meaning this test can be used) for the duration of the ?COVID-19 declaration under Section 564(b)(1) of the Act, 21 U.S.C. ?section 360bbb-3(b)(1), unless the authorization is terminated or ?revoked. ? ?Performed at [facility], [place], ?[place] [address] ?  ?  ? ? ? ? ? ?Radiology Studies: ?DG Chest 2 View ? ?Result Date: 08/04/2021 ?CLINICAL DATA:  A 52 year old male presents for evaluation of shortness of  breath. EXAM: CHEST - 2 VIEW COMPARISON:  June 11, 2010. FINDINGS: EKG leads project over the chest. Trachea midline. Cardiomediastinal contours and hilar structures with cardiac enlargement and central pulmonary vascular engorgement. Subtle opacity in the RIGHT upper chest. Generalized interstitial prominence, question early edema. No sign of pleural effusion. On limited assessment there is no acute skeletal process. IMPRESSION: 1. Cardiomegaly with central pulmonary vascular engorgement and interstitial prominence, question early edema mild asymmetry raising the question of infection. 2. No effusion. 3. Subtle opacity in the RIGHT upper chest, may represent developing infection, small nodule in this area is not excluded, in the acute setting this remains of uncertain significance. Suggest  follow-up PA and lateral chest radiograph following therapy in 6-8 weeks to ensure resolution. Electronically Signed   By: Zetta Bills M.D.   On: 08/04/2021 11:58  ? ?CT Angio Chest PE W/Cm &/Or Wo Cm ? ?[place]

## 2021-08-06 NOTE — Consult Note (Signed)
? ?  Heart Failure Nurse Navigator Note ? ?HFrEF 30-35%.  Moderate LVH.  Grade 2 diastolic dysfunction. ? ?He presented to the emergency room with complaints of dyspnea on exertion, orthopnea and lower extremity swelling.  Chest x-ray revealed pulmonary edema and BNP was elevated at 559. ? ?Comorbidities: ? ?Hypertension ?Crohn's disease ?Previous history of tobacco abuse ?Proteinuria ? ?Medications: ? ?Aspirin 81 mg daily ?Coreg 3.125 mg 2 times a day with meals ?Farxiga 10 mg daily ?Furosemide 40 mg IV 2 times a day ?Potassium chloride 20 mEq daily ? ?Labs: ? ?Sodium 140, potassium 4.1, chloride 105, CO2 27, BUN 30, creatinine 1.33 down from 1.46 of yesterday, estimated GFR greater than 60, magnesium 2.3. ?Weight 97.3 kg down from 101.2 from ED documentation. ?Intake 573 mL ?Output 1200 mL ? ?Initial meeting with the patient and his mother, Kendrick Fries was at the bedside. ? ?Discussed  heart failure, he states that his EF the doctors told him was about 30%.  Talked about what normal  EF is. ? ?Went over making good choices, eating low-sodium and avoiding sea salt at the table.  He states that he does not use salt at the table. ? ?Also discussed the importance of daily weight, after going to the bathroom and emptying his bladder and recording.  Patient states that he does not have a scale but his mother says that she does. ? ?Also talked about fluid restriction trying to limit it to 64 ounces daily.  Patient states with his job being out in the elements he can do a lot of sweating.  Made him aware in that instance when he is  sweating, or having problems with vomiting and diarrhea that he should increase his fluid intake. ? ?Also discussed secondhand smoke.  He states that he has an uncle that lives with him and his mom and has a heavy smoker.  Suggested that they avoid the smoke as much as possible.  Asking the uncle to smoke outside was not an option. ? ?He was given a living with heart failure handbook along with the  zone  magnet and info on low-sodium. ? ?Also discussed follow-up in the outpatient heart failure clinic for which she has an appointment on March 27 at 12:00 pm. ? ? ?They had no further questions. ? ?Pricilla Riffle RN [facility] ?

## 2021-08-07 ENCOUNTER — Encounter
Admission: EM | Disposition: A | Discharge status: Home / Self Care | Payer: Self-pay | Source: Home / Self Care | Attending: Internal Medicine

## 2021-08-07 ENCOUNTER — Encounter: Payer: Self-pay | Admitting: Internal Medicine

## 2021-08-07 DIAGNOSIS — I2510 Atherosclerotic heart disease of native coronary artery without angina pectoris: Secondary | ICD-10-CM | POA: Diagnosis not present

## 2021-08-07 DIAGNOSIS — Z9861 Coronary angioplasty status: Secondary | ICD-10-CM

## 2021-08-07 DIAGNOSIS — I5021 Acute systolic (congestive) heart failure: Secondary | ICD-10-CM | POA: Diagnosis not present

## 2021-08-07 DIAGNOSIS — J189 Pneumonia, unspecified organism: Secondary | ICD-10-CM | POA: Diagnosis not present

## 2021-08-07 HISTORY — PX: RIGHT/LEFT HEART CATH AND CORONARY ANGIOGRAPHY: CATH118266

## 2021-08-07 LAB — CBC
HCT: 48.1 % (ref 39.0–52.0)
Hemoglobin: 15.9 g/dL (ref 13.0–17.0)
MCH: 27.7 pg (ref 26.0–34.0)
MCHC: 33.1 g/dL (ref 30.0–36.0)
MCV: 83.7 fL (ref 80.0–100.0)
Platelets: 205 10*3/uL (ref 150–400)
RBC: 5.75 MIL/uL (ref 4.22–5.81)
RDW: 14.5 % (ref 11.5–15.5)
WBC: 9 10*3/uL (ref 4.0–10.5)
nRBC: 0 % (ref 0.0–0.2)

## 2021-08-07 LAB — BASIC METABOLIC PANEL
Anion gap: 9 (ref 5–15)
BUN: 37 mg/dL — ABNORMAL HIGH (ref 6–20)
CO2: 28 mmol/L (ref 22–32)
Calcium: 9.2 mg/dL (ref 8.9–10.3)
Chloride: 104 mmol/L (ref 98–111)
Creatinine, Ser: 1.4 mg/dL — ABNORMAL HIGH (ref 0.61–1.24)
GFR, Estimated: >60 mL/min (ref >60)
Glucose, Bld: 113 mg/dL — ABNORMAL HIGH (ref 70–99)
Potassium: 4.2 mmol/L (ref 3.5–5.1)
Sodium: 141 mmol/L (ref 135–145)

## 2021-08-07 SURGERY — RIGHT/LEFT HEART CATH AND CORONARY ANGIOGRAPHY
Anesthesia: Moderate Sedation

## 2021-08-07 MED ORDER — HEPARIN SODIUM (PORCINE) 1000 UNIT/ML IJ SOLN
INTRAMUSCULAR | Status: DC | PRN
  Administered 2021-08-07: 5000 [IU] via INTRAVENOUS

## 2021-08-07 MED ORDER — HYDRALAZINE HCL 20 MG/ML IJ SOLN: 10.0000 mg | INTRAMUSCULAR | Status: AC | PRN

## 2021-08-07 MED ORDER — FENTANYL CITRATE (PF) 100 MCG/2ML IJ SOLN
INTRAMUSCULAR | Status: AC
  Filled 2021-08-07: qty 2

## 2021-08-07 MED ORDER — LIDOCAINE HCL 1 % IJ SOLN
INTRAMUSCULAR | Status: AC
  Filled 2021-08-07: qty 20

## 2021-08-07 MED ORDER — HEPARIN (PORCINE) IN NACL 1000-0.9 UT/500ML-% IV SOLN
INTRAVENOUS | Status: AC
  Filled 2021-08-07: qty 1000

## 2021-08-07 MED ORDER — SODIUM CHLORIDE 0.9% FLUSH
3.0000 mL | Freq: Two times a day (BID) | INTRAVENOUS | Status: DC
  Administered 2021-08-07 – 2021-08-10 (×6): 3 mL via INTRAVENOUS

## 2021-08-07 MED ORDER — MIDAZOLAM HCL 2 MG/2ML IJ SOLN
INTRAMUSCULAR | Status: AC
  Filled 2021-08-07: qty 2

## 2021-08-07 MED ORDER — LIDOCAINE HCL (PF) 1 % IJ SOLN
INTRAMUSCULAR | Status: DC | PRN
  Administered 2021-08-07: 5 mL

## 2021-08-07 MED ORDER — SODIUM CHLORIDE 0.9 % IV SOLN: 250.0000 mL | INTRAVENOUS | Status: DC | PRN

## 2021-08-07 MED ORDER — IOHEXOL 300 MG/ML  SOLN
INTRAMUSCULAR | Status: DC | PRN
  Administered 2021-08-07: 24 mL

## 2021-08-07 MED ORDER — SODIUM CHLORIDE 0.9% FLUSH: 3.0000 mL | INTRAVENOUS | Status: DC | PRN

## 2021-08-07 MED ORDER — FENTANYL CITRATE (PF) 100 MCG/2ML IJ SOLN
INTRAMUSCULAR | Status: DC | PRN
  Administered 2021-08-07: 25 ug via INTRAVENOUS

## 2021-08-07 MED ORDER — SACUBITRIL-VALSARTAN 24-26 MG PO TABS
1.0000 | ORAL_TABLET | Freq: Two times a day (BID) | ORAL | Status: DC
  Administered 2021-08-07 – 2021-08-08 (×4): 1 via ORAL
  Filled 2021-08-07 (×4): qty 1

## 2021-08-07 MED ORDER — ATORVASTATIN CALCIUM 80 MG PO TABS
80.0000 mg | ORAL_TABLET | Freq: Every evening | ORAL | Status: DC
  Administered 2021-08-07 – 2021-08-09 (×3): 80 mg via ORAL
  Filled 2021-08-07 (×3): qty 1

## 2021-08-07 MED ORDER — VERAPAMIL HCL 2.5 MG/ML IV SOLN
INTRAVENOUS | Status: AC
  Filled 2021-08-07: qty 2

## 2021-08-07 MED ORDER — MIDAZOLAM HCL 2 MG/2ML IJ SOLN
INTRAMUSCULAR | Status: DC | PRN
  Administered 2021-08-07: 1 mg via INTRAVENOUS

## 2021-08-07 MED ORDER — HEPARIN (PORCINE) IN NACL 1000-0.9 UT/500ML-% IV SOLN
INTRAVENOUS | Status: DC | PRN
  Administered 2021-08-07: 1000 mL

## 2021-08-07 MED ORDER — HEPARIN SODIUM (PORCINE) 1000 UNIT/ML IJ SOLN
INTRAMUSCULAR | Status: AC
  Filled 2021-08-07: qty 10

## 2021-08-07 MED ORDER — LABETALOL HCL 5 MG/ML IV SOLN
INTRAVENOUS | Status: AC
  Filled 2021-08-07: qty 4

## 2021-08-07 MED ORDER — VERAPAMIL HCL 2.5 MG/ML IV SOLN
INTRAVENOUS | Status: DC | PRN
  Administered 2021-08-07: 2.5 mg via INTRA_ARTERIAL

## 2021-08-07 SURGICAL SUPPLY — 16 items
BAND CMPR LRG ZPHR (HEMOSTASIS) ×1
BAND ZEPHYR COMPRESS 30 LONG (HEMOSTASIS) ×1 IMPLANT
CATH 5F 110X4 TIG (CATHETERS) ×1 IMPLANT
CATH BALLN WEDGE 5F 110CM (CATHETERS) ×1 IMPLANT
CATH INFINITI 5FR ANG PIGTAIL (CATHETERS) ×1 IMPLANT
DRAPE BRACHIAL (DRAPES) ×2 IMPLANT
GLIDESHEATH SLEND SS 6F .021 (SHEATH) ×1 IMPLANT
GUIDEWIRE .025 260CM (WIRE) ×1 IMPLANT
GUIDEWIRE INQWIRE 1.5J.035X260 (WIRE) IMPLANT
INQWIRE 1.5J .035X260CM (WIRE) ×2
KIT SYRINGE INJ CVI SPIKEX1 (MISCELLANEOUS) ×1 IMPLANT
PACK CARDIAC CATH (CUSTOM PROCEDURE TRAY) ×2 IMPLANT
PROTECTION STATION PRESSURIZED (MISCELLANEOUS) ×2
SET ATX SIMPLICITY (MISCELLANEOUS) ×1 IMPLANT
SHEATH GLIDE SLENDER 4/5FR (SHEATH) ×1 IMPLANT
STATION PROTECTION PRESSURIZED (MISCELLANEOUS) IMPLANT

## 2021-08-07 NOTE — Progress Notes (Signed)
?PROGRESS NOTE ? ? ? ?Clarence [name]  WPY:[id number] DOB: 03-Jan-1971 DOA: 08/04/2021 ?PCP: Lynnell Jude, MD  ? ? ?Assessment & Plan: ?  ?Principal Problem: ?  Acute systolic congestive heart failure ([place]) ?Active Problems: ?  Atypical pneumonia ?  Essential hypertension ?  Crohn's disease ([place]) ?  CKD (chronic kidney disease) stage 2, GFR 60-89 ml/min ?  Elevated troponin ? ? ?Acute systolic CHF: w/ cardiomyopathy (unknown type) as per cardio. echo shows EF 30-35%. Continue on lasix, coreg. [place] I/Os. S/p cardiac cath which showed mod-severe multivessel disease that will be treated medically currently as per cardio.  ? ?Atypical pneumonia: continue on IV rocephin, azithromycin, steroids, bronchodilators. Encourage flutter valve & incentive spirometry. Continue on mucinex  ? ?HTN:  started on entresto & d/c BB as per cardio  ?  ?Crohn's disease: w/ hx of ostomy. Continue w/ supportive care  ?  ?CKDII: Cr baseline around 1.29.  Cr is trending up from day prior ?  ?Elevated troponin: mild & trending down. Likely secondary to demand ischemia ? ?Obesity: BMI 30.6. Would benefit from weight loss  ? ? ? ?DVT prophylaxis: lovenox  ?Code Status: full  ?Family Communication:  ?Disposition Plan: likely d/c back home  ? ?Level of care: Telemetry Cardiac ? ?Status is: Inpatient ?Remains inpatient appropriate because: s/p cardiac cath today   ? ? ? ?Consultants:  ?Cardio  ? ?Procedures:  ? ?Antimicrobials:  ? ? ?Subjective: ?Pt c/o malaise  ? ?Objective: ?Vitals:  ? 08/07/21 0451 08/07/21 0720 08/07/21 0732 08/07/21 0539  ?BP:  (!) 139/116  (!) 153/109  ?Pulse:  88 88 85  ?Resp:  20 (!) 30 (!) 31  ?Temp:  98.1 ?F (36.7 ?C)    ?TempSrc:  Oral    ?SpO2:  94% 97% 96%  ?Weight: 96.9 kg 96.9 kg    ?Height:  5' 10"  (1.778 m)    ? ? ?Intake/Output Summary (Last 24 hours) at 08/07/2021 0752 ?Last data filed at 08/07/2021 0400 ?Gross per 24 hour  ?Intake 600 ml  ?Output 2540 ml  ?Net -1940 ml  ? ?Filed Weights  ? 08/06/21 0900 08/07/21  0451 08/07/21 0720  ?Weight: 97.3 kg 96.9 kg 96.9 kg  ? ? ?Examination: ? ?General exam: Appears comfortable  ?Respiratory system: diminished breath sounds b/l ?Cardiovascular system: S1/S2+. No rubs or gallops ?Gastrointestinal system: Abd is soft, NT, ND & hypoactive bowel sounds. ?Central nervous system: Alert and oriented. Moves all extremities. ?Psychiatry: judgement and insight appears normal. Appropriate mood and affect  ? ? ? ?Data Reviewed: I have personally reviewed following labs and imaging studies ? ?CBC: ?Recent Labs  ?Lab 08/04/21 ?1050 08/05/21 ?7673 08/07/21 ?0440  ?WBC 7.2 7.8 9.0  ?HGB 15.4 14.8 15.9  ?HCT 46.0 43.2 48.1  ?MCV 84.6 83.6 83.7  ?PLT 185 185 205  ? ?Basic Metabolic Panel: ?Recent Labs  ?Lab 08/04/21 ?1050 08/05/21 ?4193 08/06/21 ?0427 08/07/21 ?0440  ?NA 138 141 140 141  ?K 3.9 3.7 4.1 4.2  ?CL 100 104 105 104  ?CO2 30 27 27 28   ?GLUCOSE 144* 116* 125* 113*  ?BUN 16 29* 30* 37*  ?CREATININE 1.29* 1.46* 1.33* 1.40*  ?CALCIUM 9.1 8.9 8.7* 9.2  ?MG  --  2.1 2.3  --   ? ?GFR: ?Estimated Creatinine Clearance: 72.1 mL/min (A) (by C-G formula based on SCr of 1.4 mg/dL (H)). ?Liver Function Tests: ?No results for input(s): AST, ALT, ALKPHOS, BILITOT, PROT, ALBUMIN in the last 168 hours. ?No results for input(s):  LIPASE, AMYLASE in the last 168 hours. ?No results for input(s): AMMONIA in the last 168 hours. ?Coagulation Profile: ?No results for input(s): INR, PROTIME in the last 168 hours. ?Cardiac Enzymes: ?No results for input(s): CKTOTAL, CKMB, CKMBINDEX, TROPONINI in the last 168 hours. ?BNP (last 3 results) ?No results for input(s): PROBNP in the last 8760 hours. ?HbA1C: ?Recent Labs  ?  08/05/21 ?0093  ?HGBA1C 5.4  ? ?CBG: ?No results for input(s): GLUCAP in the last 168 hours. ?Lipid Profile: ?Recent Labs  ?  08/05/21 ?8182  ?CHOL 207*  ?HDL 34*  ?LDLCALC 134*  ?TRIG 196*  ?CHOLHDL 6.1  ? ?Thyroid Function Tests: ?Recent Labs  ?  08/04/21 ?1742  ?TSH 0.258*  ? ?Anemia Panel: ?No  results for input(s): VITAMINB12, FOLATE, FERRITIN, TIBC, IRON, RETICCTPCT in the last 72 hours. ?Sepsis Labs: ?Recent Labs  ?Lab 08/05/21 ?9937  ?PROCALCITON 0.14  ? ? ?Recent Results (from the past 240 hour(s))  ?Resp Panel by RT-PCR (Flu A&B, Covid) Nasopharyngeal Swab     Status: None  ? Collection Time: 08/04/21 10:50 AM  ? Specimen: Nasopharyngeal Swab; Nasopharyngeal(NP) swabs in vial transport medium  ?Result Value Ref Range Status  ? SARS Coronavirus 2 by RT PCR NEGATIVE NEGATIVE Final  ?  Comment: (NOTE) ?SARS-CoV-2 target nucleic acids are NOT DETECTED. ? ?The SARS-CoV-2 RNA is generally detectable in upper respiratory ?specimens during the acute phase of infection. The lowest ?concentration of SARS-CoV-2 viral copies this assay can detect is ?138 copies/mL. A negative result does not preclude SARS-Cov-2 ?infection and should not be used as the sole basis for treatment or ?other patient management decisions. A negative result may occur with  ?improper specimen collection/handling, submission of specimen other ?than nasopharyngeal swab, presence of viral mutation(s) within the ?areas targeted by this assay, and inadequate number of viral ?copies(<138 copies/mL). A negative result must be combined with ?clinical observations, patient history, and epidemiological ?information. The expected result is Negative. ? ?Fact Sheet for Patients:  ?EntrepreneurPulse.com.au ? ?Fact Sheet for Healthcare Providers:  ?IncredibleEmployment.be ? ?This test is no t yet approved or cleared by the [place] FDA and  ?has been authorized for detection and/or diagnosis of SARS-CoV-2 by ?FDA under an Emergency Use Authorization (EUA). This EUA will remain  ?in effect (meaning this test can be used) for the duration of the ?COVID-19 declaration under Section 564(b)(1) of the Act, 21 ?U.S.C.section 360bbb-3(b)(1), unless the authorization is terminated  ?or revoked sooner.  ? ? ?  ? Influenza  A by PCR NEGATIVE NEGATIVE Final  ? Influenza B by PCR NEGATIVE NEGATIVE Final  ?  Comment: (NOTE) ?The Xpert Xpress SARS-CoV-2/FLU/RSV plus assay is intended as an aid ?in the diagnosis of influenza from Nasopharyngeal swab specimens and ?should not be used as a sole basis for treatment. Nasal washings and ?aspirates are unacceptable for Xpert Xpress SARS-CoV-2/FLU/RSV ?testing. ? ?Fact Sheet for Patients: ?EntrepreneurPulse.com.au ? ?Fact Sheet for Healthcare Providers: ?IncredibleEmployment.be ? ?This test is not yet approved or cleared by the [place] FDA and ?has been authorized for detection and/or diagnosis of SARS-CoV-2 by ?FDA under an Emergency Use Authorization (EUA). This EUA will remain ?in effect (meaning this test can be used) for the duration of the ?COVID-19 declaration under Section 564(b)(1) of the Act, 21 U.S.C. ?section 360bbb-3(b)(1), unless the authorization is terminated or ?revoked. ? ?Performed at [facility], [place], ?[place] [address] ?  ?  ? ? ? ? ? ?Radiology Studies: ?ECHOCARDIOGRAM COMPLETE ? ?  Result Date: 08/05/2021 ?   ECHOCARDIOGRAM REPORT   Patient Name:   DARCEY DEMMA [facility] Date of Exam: 08/05/2021 Medical Rec #:  [id number]         Height:       70.0 in Accession #:    [id number]        Weight:       223.0 lb Date of Birth:  1970-09-19          BSA:          2.186 m? Patient Age:    51 years          BP:           144/88 mmHg Patient Gender: M                 HR:           86 bpm. Exam Location:  [facility] Procedure: 2D Echo, Color Doppler, Cardiac Doppler and Intracardiac            Opacification Agent Indications:     R06.00 Dyspnea  History:         Patient has no prior history of Echocardiogram examinations.                  Signs/Symptoms:Shortness of Breath and Edema; Risk                  Factors:Hypertension.  Sonographer:     Charmayne Sheer Referring Phys:  Merrill NVBT Diagnosing Phys: Nelva Bush MD   Sonographer Comments: Suboptimal apical window. IMPRESSIONS  1. Left ventricular ejection fraction, by estimation, is 30 to 35%. The left ventricle has moderately decreased function. The left ventricle de

## 2021-08-07 NOTE — Progress Notes (Signed)
Mobility Specialist - Progress Note ? ? 08/07/21 1600  ?Mobility  ?Activity Ambulated independently in hallway;Stood at bedside;Dangled on edge of bed  ?Level of Assistance Independent  ?Assistive Device None  ?Distance Ambulated (ft) 500 ft  ?Activity Response Tolerated well  ?$Mobility charge 1 Mobility  ? ? ? ?Pre-mobility: 80 HR, 93% SpO2 ?During mobility: 94 HR, 94%-90%  SpO2 ?Post-mobility: 97 HR, 93% SPO2 ? ?Pt supine upon arrival using RA. Pt completes bed mobility, STS and ambulates 3 laps indep --- mild SOB noted after lap 2. Pt returns to room voicing no complaints. Pt left with needs in reach. ? ?Merrily Brittle ?Mobility Specialist ?08/07/21, 4:56 PM ? ? ?

## 2021-08-07 NOTE — Interval H&P Note (Signed)
History and Physical Interval Note: ? ?08/07/2021 ?7:59 AM ? ?Clarence [name]  has presented today for surgery, with the diagnosis of acute HFrEF.  The various methods of treatment have been discussed with the patient and family. After consideration of risks, benefits and other options for treatment, the patient has consented to  Procedure(s): ?RIGHT/LEFT HEART CATH AND CORONARY ANGIOGRAPHY (N/A) as a surgical intervention.  The patient's history has been reviewed, patient examined, no change in status, stable for surgery.  I have reviewed the patient's chart and labs.  Questions were answered to the patient's satisfaction.   ? ?Cath Lab Visit (complete for each Cath Lab visit) ? ?Clinical Evaluation Leading to the Procedure:  ? ?ACS: Yes.  (Acute HFrEF with elevated HS-TnI) ? ?Non-ACS:  N/A ? ? ?Clarence [name] ? ? ?

## 2021-08-07 NOTE — Consult Note (Signed)
Pt was counseled on entresto and we gave him 30-day free trial and 10-dollar co-pay coupons. Co-pay is 30 dollars.  ? ?Thanks,  ?Eleonore Chiquito, PharmD ?

## 2021-08-08 ENCOUNTER — Other Ambulatory Visit (HOSPITAL_COMMUNITY): Payer: Self-pay

## 2021-08-08 DIAGNOSIS — I5021 Acute systolic (congestive) heart failure: Secondary | ICD-10-CM | POA: Diagnosis not present

## 2021-08-08 DIAGNOSIS — J189 Pneumonia, unspecified organism: Secondary | ICD-10-CM | POA: Diagnosis not present

## 2021-08-08 DIAGNOSIS — I10 Essential (primary) hypertension: Secondary | ICD-10-CM | POA: Diagnosis not present

## 2021-08-08 LAB — BASIC METABOLIC PANEL
Anion gap: 11 (ref 5–15)
BUN: 34 mg/dL — ABNORMAL HIGH (ref 6–20)
CO2: 27 mmol/L (ref 22–32)
Calcium: 9 mg/dL (ref 8.9–10.3)
Chloride: 104 mmol/L (ref 98–111)
Creatinine, Ser: 1.3 mg/dL — ABNORMAL HIGH (ref 0.61–1.24)
GFR, Estimated: >60 mL/min (ref >60)
Glucose, Bld: 152 mg/dL — ABNORMAL HIGH (ref 70–99)
Potassium: 4.2 mmol/L (ref 3.5–5.1)
Sodium: 142 mmol/L (ref 135–145)

## 2021-08-08 LAB — CBC
HCT: 49.6 % (ref 39.0–52.0)
Hemoglobin: 16.8 g/dL (ref 13.0–17.0)
MCH: 28.3 pg (ref 26.0–34.0)
MCHC: 33.9 g/dL (ref 30.0–36.0)
MCV: 83.5 fL (ref 80.0–100.0)
Platelets: 219 10*3/uL (ref 150–400)
RBC: 5.94 MIL/uL — ABNORMAL HIGH (ref 4.22–5.81)
RDW: 14 % (ref 11.5–15.5)
WBC: 8.4 10*3/uL (ref 4.0–10.5)
nRBC: 0 % (ref 0.0–0.2)

## 2021-08-08 MED ORDER — METHYLPREDNISOLONE SODIUM SUCC 40 MG IJ SOLR
30.0000 mg | INTRAMUSCULAR | Status: DC
  Administered 2021-08-09 – 2021-08-10 (×2): 30 mg via INTRAVENOUS
  Filled 2021-08-08 (×2): qty 1

## 2021-08-08 MED ORDER — SPIRONOLACTONE 25 MG PO TABS
25.0000 mg | ORAL_TABLET | Freq: Every day | ORAL | Status: DC
  Administered 2021-08-08 – 2021-08-10 (×3): 25 mg via ORAL
  Filled 2021-08-08 (×3): qty 1

## 2021-08-08 NOTE — Progress Notes (Addendum)
? ?Cardiology Progress Note  ? ?Patient Name: ROSEMARY PENTECOST ?Date of Encounter: 08/08/2021 ? ?Primary Cardiologist: Nelva Bush, MD ? ?Subjective  ? ?Didn't sleep well.  Wheezing and coughing much of the night.  Has been using suction to clear secretions.  No chest pain.  Breathing sl better this AM. ? ?Inpatient Medications  ?  ?Scheduled Meds: ? albuterol  2.5 mg Nebulization TID  ? aspirin EC  81 mg Oral Daily  ? atorvastatin  80 mg Oral QPM  ? dapagliflozin propanediol  10 mg Oral Daily  ? enoxaparin (LOVENOX) injection  0.5 mg/kg Subcutaneous QHS  ? furosemide  40 mg Intravenous BID  ? guaiFENesin  1,200 mg Oral BID  ? methylPREDNISolone (SOLU-MEDROL) injection  40 mg Intravenous Q24H  ? potassium chloride  20 mEq Oral Daily  ? sacubitril-valsartan  1 tablet Oral BID  ? sodium chloride flush  3 mL Intravenous Q12H  ? ?Continuous Infusions: ? sodium chloride    ? cefTRIAXone (ROCEPHIN)  IV 2 g (08/08/21 0941)  ? ?PRN Meds: ?sodium chloride, chlorpheniramine-HYDROcodone, ondansetron, phenol, sodium chloride flush  ? ?Vital Signs  ?  ?Vitals:  ? 08/08/21 0400 08/08/21 0431 08/08/21 0724 08/08/21 0904  ?BP:  (!) 143/109  (!) 131/91  ?Pulse:  71  63  ?Resp:  20  17  ?Temp:  97.8 ?F (36.6 ?C)  97.7 ?F (36.5 ?C)  ?TempSrc:      ?SpO2:  98% 97% 96%  ?Weight: 95.4 kg     ?Height:      ? ? ?Intake/Output Summary (Last 24 hours) at 08/08/2021 1007 ?Last data filed at 08/08/2021 0805 ?Gross per 24 hour  ?Intake 3 ml  ?Output 3125 ml  ?Net -3122 ml  ? ?Filed Weights  ? 08/07/21 0451 08/07/21 0720 08/08/21 0400  ?Weight: 96.9 kg 96.9 kg 95.4 kg  ? ? ?Physical Exam  ? ?GEN: Well nourished, well developed, in no acute distress.  ?HEENT: Grossly normal.  ?Neck: Supple, JVD to jaw, no carotid bruits or masses. ?Cardiac: RRR, no murmurs, rubs, or gallops. No clubbing, cyanosis, edema.  Radials 2+, DP/PT 2+ and equal bilaterally.  R radial/brachial cath sites w/o bleeding/bruits/hematoma. ?Respiratory:  Respirations regular  and unlabored, coarse breath sounds bilat w/ diffuses insp/exp wheezing. ?GI: Soft, nontender, nondistended, BS + x 4.  RUQ colostomy. ?MS: no deformity or atrophy. ?Skin: warm and dry, no rash. ?Neuro:  Strength and sensation are intact. ?Psych: AAOx3.  Normal affect. ? ?Labs  ?  ?Chemistry ?Recent Labs  ?Lab 08/06/21 ?0427 08/07/21 ?2355 08/08/21 ?0507  ?NA 140 141 142  ?K 4.1 4.2 4.2  ?CL 105 104 104  ?CO2 27 28 27   ?GLUCOSE 125* 113* 152*  ?BUN 30* 37* 34*  ?CREATININE 1.33* 1.40* 1.30*  ?CALCIUM 8.7* 9.2 9.0  ?GFRNONAA >60 >60 >60  ?ANIONGAP 8 9 11   ?  ? ?Hematology ?Recent Labs  ?Lab 08/05/21 ?7322 08/07/21 ?0254 08/08/21 ?0507  ?WBC 7.8 9.0 8.4  ?RBC 5.17 5.75 5.94*  ?HGB 14.8 15.9 16.8  ?HCT 43.2 48.1 49.6  ?MCV 83.6 83.7 83.5  ?MCH 28.6 27.7 28.3  ?MCHC 34.3 33.1 33.9  ?RDW 14.5 14.5 14.0  ?PLT 185 205 219  ? ? ?Cardiac Enzymes  ?Recent Labs  ?Lab 08/04/21 ?1050 08/04/21 ?1742  ?TROPONINIHS 89* 80*  ?   ? ?BNPBNP ?   ?Component Value Date/Time  ? BNP 559.8 (H) 08/04/2021 1050  ? ? ?Lipids  ?Lab Results  ?Component Value Date  ? CHOL  207 (H) 08/05/2021  ? HDL 34 (L) 08/05/2021  ? LDLCALC 134 (H) 08/05/2021  ? TRIG 196 (H) 08/05/2021  ? CHOLHDL 6.1 08/05/2021  ? ? ?HbA1c  ?Lab Results  ?Component Value Date  ? HGBA1C 5.4 08/05/2021  ? ? ?Radiology  ?  ?DG Chest 2 View ? ?Result Date: 08/04/2021 ?CLINICAL DATA:  A 51 year old male presents for evaluation of shortness of breath. EXAM: CHEST - 2 VIEW COMPARISON:  June 11, 2010. FINDINGS: EKG leads project over the chest. Trachea midline. Cardiomediastinal contours and hilar structures with cardiac enlargement and central pulmonary vascular engorgement. Subtle opacity in the RIGHT upper chest. Generalized interstitial prominence, question early edema. No sign of pleural effusion. On limited assessment there is no acute skeletal process. IMPRESSION: 1. Cardiomegaly with central pulmonary vascular engorgement and interstitial prominence, question early edema mild  asymmetry raising the question of infection. 2. No effusion. 3. Subtle opacity in the RIGHT upper chest, may represent developing infection, small nodule in this area is not excluded, in the acute setting this remains of uncertain significance. Suggest follow-up PA and lateral chest radiograph following therapy in 6-8 weeks to ensure resolution. Electronically Signed   By: Zetta Bills M.D.   On: 08/04/2021 11:58  ? ?CT Angio Chest PE W/Cm &/Or Wo Cm ? ?Result Date: 08/04/2021 ?CLINICAL DATA:  PE suspected, high probability. EXAM: CT ANGIOGRAPHY CHEST WITH CONTRAST TECHNIQUE: Multidetector CT imaging of the chest was performed using the standard protocol during bolus administration of intravenous contrast. Multiplanar CT image reconstructions and MIPs were obtained to evaluate the vascular anatomy. RADIATION DOSE REDUCTION: This exam was performed according to the departmental dose-optimization program which includes automated exposure control, adjustment of the mA and/or kV according to patient size and/or use of iterative reconstruction technique. CONTRAST:  45m OMNIPAQUE IOHEXOL 350 MG/ML SOLN COMPARISON:  Chest radiograph June 11, 2010 FINDINGS: Cardiovascular: Satisfactory opacification of the pulmonary arteries to the segmental level. No evidence of pulmonary embolism. Normal caliber thoracic aorta. Cardiomegaly. No significant pericardial effusion/thickening. Mediastinum/Nodes: Mildly enlarged/prominent mediastinal and hilar lymph nodes. For reference: -Left paratracheal lymph node measures 1 cm in short axis on image 22/4 -Prevascular lymph node measures 1 cm in short axis on image 39/4 -Right hilar lymph node measures 12 mm in short axis on image 57/4 Lungs/Pleura: Multifocal areas of clustered nodularity in the right lung for instance in the right upper lobe on image 38/6 and in the paramedian right lower lobe best seen on coronal image 66/7. Mild interstitial thickening with adjacent ground-glass  opacities in the lung bases likely reflects mild pulmonary edema. No pleural effusion. No pneumothorax. Upper Abdomen: Mild splenomegaly measuring 15.8 cm in maximum axial dimension, slightly increased from CT February 06, 2020. Musculoskeletal: No chest wall abnormality. No acute or significant osseous findings. Review of the MIP images confirms the above findings. IMPRESSION: 1. No evidence of pulmonary embolism. 2. Multifocal areas of clustered nodularity in the right lung, likely reflecting an infectious or inflammatory process. Recommend follow-up CT in 1-3 months to ensure resolution. 3. Cardiomegaly with mild interstitial thickening with adjacent ground-glass opacities in the lung bases likely reflects mild pulmonary edema. 4. Mildly enlarged/prominent mediastinal and hilar lymph nodes, likely reactive. Attention on follow-up imaging suggested. 5. Mild splenomegaly, slightly increased from CT February 06, 2020, and of indeterminate clinical significance. Electronically Signed   By: JDahlia BailiffM.D.   On: 08/04/2021 12:55  ? ?Telemetry  ?  ?RSR, periodic bradycardia overnight - lows in the 30's - Personally  Reviewed ? ?Cardiac Studies  ? ?Echo 08/05/21 ?  ? 1. Left ventricular ejection fraction, by estimation, is 30 to 35%. The  ?left ventricle has moderately decreased function. The left ventricle  ?demonstrates global hypokinesis. There is moderate left ventricular  ?hypertrophy. Left ventricular diastolic  ?parameters are consistent with Grade II diastolic dysfunction  ?(pseudonormalization).  ? 2. Right ventricular systolic function is normal. The right ventricular  ?size is normal. Tricuspid regurgitation signal is inadequate for assessing  ?PA pressure.  ? 3. Left atrial size was mildly dilated.  ? 4. The mitral valve is abnormal. Mild to moderate mitral valve  ?regurgitation. No evidence of mitral stenosis.  ? 5. The aortic valve has an indeterminant number of cusps. Aortic valve  ?regurgitation is  not visualized. No aortic stenosis is present.  ? 6. The inferior vena cava is dilated in size with <50% respiratory  ?variability, suggesting right atrial pressure of 15 mmHg.  ?_____________  ? ?Cardia

## 2021-08-08 NOTE — TOC Benefit Eligibility Note (Signed)
Patient Advocate Encounter ? ?Insurance verification completed.   ? ?The patient is currently admitted and upon discharge could be taking Farxiga 10 mg. ? ?The current 30 day co-pay is, $0.00.  ? ?The patient is insured through [employer]  ? ? ? ?Lyndel Safe, CPhT ?Pharmacy Patient Advocate Specialist ?[place] Patient Advocate Team ?Direct Number: [phone]  Fax: [phone] ? ? ? ? ? ?  ?

## 2021-08-08 NOTE — Progress Notes (Signed)
Mobility Specialist - Progress Note ? ? ? 08/08/21 1400  ?Mobility  ?Activity Ambulated independently in hallway;Stood at bedside;Dangled on edge of bed  ?Level of Assistance Independent  ?Assistive Device None  ?Distance Ambulated (ft) 400 ft  ?Activity Response Tolerated well  ?$Mobility charge 1 Mobility  ? ? ?Pre-mobility: 85 HR, 96% SpO2 ?During mobility: 95 HR, 94%SpO2 ? ?Pt supine upon arrival using RA. Pt completes bed mobility and STS indep and ambulates 3 laps around nursing station voicing no complaints. Pt returned to bed with needs in reach. ? ?Merrily Brittle ?Mobility Specialist ?08/08/21, 2:24 PM ? ? ? ? ?

## 2021-08-08 NOTE — Progress Notes (Signed)
?PROGRESS NOTE ? ? ? ?NAVI ERBER  ZOX:[id number] DOB: 08/06/1970 DOA: 08/04/2021 ?PCP: Lynnell Jude, MD  ? ? ?Assessment & Plan: ?  ?Principal Problem: ?  Acute HFrEF (heart failure with reduced ejection fraction) ([place]) ?Active Problems: ?  Atypical pneumonia ?  Essential hypertension ?  Crohn's disease ([place]) ?  CKD (chronic kidney disease) stage 2, GFR 60-89 ml/min ?  Elevated troponin ? ? ?Acute systolic CHF: w/ cardiomyopathy (unknown type) as per cardio. echo shows EF 30-35%. Monitor I/Os. S/p cardiac cath which showed mod-severe multivessel disease that will be treated medically currently as per cardio. Continue on IV laisx. Neg approx 2.4L. Continue on entres, aldactone.  ? ?Atypical pneumonia: completed abx course. Start steroid taper and continue on bronchodilators, mucinex. Encourage incentive spirometry, flutter valve.  ? ?HTN: continue on entresto, aldactone. BB was d/c as per cardio  ? ?Crohn's disease: w/ hx of ostomy. Continue w/ supportive care  ?  ?CKDII: Cr baseline around 1.29. Cr is labile  ?  ?Elevated troponin: mild & trending down. Likely secondary to demand ischemia ? ?Obesity: BMI 30.1. Would benefit from weight loss ? ? ? ?DVT prophylaxis: lovenox  ?Code Status: full  ?Family Communication: discussed pt's care w/ pt's mother at bedside and answered her questions  ?Disposition Plan: likely d/c back home  ? ?Level of care: Telemetry Cardiac ? ?Status is: Inpatient ?Remains inpatient appropriate because: still requiring IV lasix  ? ? ? ?Consultants:  ?Cardio  ? ?Procedures:  ? ?Antimicrobials:  ? ? ?Subjective: ?Pt c/o intermittent cough  ? ?Objective: ?Vitals:  ? 08/07/21 2045 08/08/21 0013 08/08/21 0400 08/08/21 0431  ?BP: (!) 141/92 (!) 137/91  (!) 143/109  ?Pulse: 76 72  71  ?Resp: 16 20  20   ?Temp: 97.8 ?F (36.6 ?C) (!) 97.4 ?F (36.3 ?C)  97.8 ?F (36.6 ?C)  ?TempSrc:      ?SpO2: 95% 97%  98%  ?Weight:   95.4 kg   ?Height:      ? ? ?Intake/Output Summary (Last 24 hours) at 08/08/2021  0655 ?Last data filed at 08/08/2021 0400 ?Gross per 24 hour  ?Intake 243 ml  ?Output 2775 ml  ?Net -2532 ml  ? ?Filed Weights  ? 08/07/21 0451 08/07/21 0720 08/08/21 0400  ?Weight: 96.9 kg 96.9 kg 95.4 kg  ? ? ?Examination: ? ?General exam: Appears calm & comfortable   ?Respiratory system: course breath sounds b/l  ?Cardiovascular system: S1/S2+. No rubs or gallops  ?Gastrointestinal system: Abd is soft, NT, ND & hypoactive bowel sounds. ?Central nervous system: Alert and oriented. Moves all extremities  ?Psychiatry: judgement and insight appears normal. Flat mood and affect ? ? ? ?Data Reviewed: I have personally reviewed following labs and imaging studies ? ?CBC: ?Recent Labs  ?Lab 08/04/21 ?1050 08/05/21 ?8119 08/07/21 ?1478 08/08/21 ?0507  ?WBC 7.2 7.8 9.0 8.4  ?HGB 15.4 14.8 15.9 16.8  ?HCT 46.0 43.2 48.1 49.6  ?MCV 84.6 83.6 83.7 83.5  ?PLT 185 185 205 219  ? ?Basic Metabolic Panel: ?Recent Labs  ?Lab 08/04/21 ?1050 08/05/21 ?2956 08/06/21 ?0427 08/07/21 ?2130 08/08/21 ?0507  ?NA 138 141 140 141 142  ?K 3.9 3.7 4.1 4.2 4.2  ?CL 100 104 105 104 104  ?CO2 30 27 27 28 27   ?GLUCOSE 144* 116* 125* 113* 152*  ?BUN 16 29* 30* 37* 34*  ?CREATININE 1.29* 1.46* 1.33* 1.40* 1.30*  ?CALCIUM 9.1 8.9 8.7* 9.2 9.0  ?MG  --  2.1 2.3  --   --   ? ?  GFR: ?Estimated Creatinine Clearance: 77.1 mL/min (A) (by C-G formula based on SCr of 1.3 mg/dL (H)). ?Liver Function Tests: ?No results for input(s): AST, ALT, ALKPHOS, BILITOT, PROT, ALBUMIN in the last 168 hours. ?No results for input(s): LIPASE, AMYLASE in the last 168 hours. ?No results for input(s): AMMONIA in the last 168 hours. ?Coagulation Profile: ?No results for input(s): INR, PROTIME in the last 168 hours. ?Cardiac Enzymes: ?No results for input(s): CKTOTAL, CKMB, CKMBINDEX, TROPONINI in the last 168 hours. ?BNP (last 3 results) ?No results for input(s): PROBNP in the last 8760 hours. ?HbA1C: ?No results for input(s): HGBA1C in the last 72 hours. ? ?CBG: ?No results for  input(s): GLUCAP in the last 168 hours. ?Lipid Profile: ?No results for input(s): CHOL, HDL, LDLCALC, TRIG, CHOLHDL, LDLDIRECT in the last 72 hours. ? ?Thyroid Function Tests: ?No results for input(s): TSH, T4TOTAL, FREET4, T3FREE, THYROIDAB in the last 72 hours. ? ?Anemia Panel: ?No results for input(s): VITAMINB12, FOLATE, FERRITIN, TIBC, IRON, RETICCTPCT in the last 72 hours. ?Sepsis Labs: ?Recent Labs  ?Lab 08/05/21 ?5035  ?PROCALCITON 0.14  ? ? ?Recent Results (from the past 240 hour(s))  ?Resp Panel by RT-PCR (Flu A&B, Covid) Nasopharyngeal Swab     Status: None  ? Collection Time: 08/04/21 10:50 AM  ? Specimen: Nasopharyngeal Swab; Nasopharyngeal(NP) swabs in vial transport medium  ?Result Value Ref Range Status  ? SARS Coronavirus 2 by RT PCR NEGATIVE NEGATIVE Final  ?  Comment: (NOTE) ?SARS-CoV-2 target nucleic acids are NOT DETECTED. ? ?The SARS-CoV-2 RNA is generally detectable in upper respiratory ?specimens during the acute phase of infection. The lowest ?concentration of SARS-CoV-2 viral copies this assay can detect is ?138 copies/mL. A negative result does not preclude SARS-Cov-2 ?infection and should not be used as the sole basis for treatment or ?other patient management decisions. A negative result may occur with  ?improper specimen collection/handling, submission of specimen other ?than nasopharyngeal swab, presence of viral mutation(s) within the ?areas targeted by this assay, and inadequate number of viral ?copies(<138 copies/mL). A negative result must be combined with ?clinical observations, patient history, and epidemiological ?information. The expected result is Negative. ? ?Fact Sheet for Patients:  ?EntrepreneurPulse.com.au ? ?Fact Sheet for Healthcare Providers:  ?IncredibleEmployment.be ? ?This test is no t yet approved or cleared by the [place] FDA and  ?has been authorized for detection and/or diagnosis of SARS-CoV-2 by ?FDA under an Emergency  Use Authorization (EUA). This EUA will remain  ?in effect (meaning this test can be used) for the duration of the ?COVID-19 declaration under Section 564(b)(1) of the Act, 21 ?U.S.C.section 360bbb-3(b)(1), unless the authorization is terminated  ?or revoked sooner.  ? ? ?  ? Influenza A by PCR NEGATIVE NEGATIVE Final  ? Influenza B by PCR NEGATIVE NEGATIVE Final  ?  Comment: (NOTE) ?The Xpert Xpress SARS-CoV-2/FLU/RSV plus assay is intended as an aid ?in the diagnosis of influenza from Nasopharyngeal swab specimens and ?should not be used as a sole basis for treatment. Nasal washings and ?aspirates are unacceptable for Xpert Xpress SARS-CoV-2/FLU/RSV ?testing. ? ?Fact Sheet for Patients: ?EntrepreneurPulse.com.au ? ?Fact Sheet for Healthcare Providers: ?IncredibleEmployment.be ? ?This test is not yet approved or cleared by the [place] FDA and ?has been authorized for detection and/or diagnosis of SARS-CoV-2 by ?FDA under an Emergency Use Authorization (EUA). This EUA will remain ?in effect (meaning this test can be used) for the duration of the ?COVID-19 declaration under Section 564(b)(1) of the Act, 21 U.S.C. ?section 360bbb-3(b)(1), unless  the authorization is terminated or ?revoked. ? ?Performed at [facility], [place], ?[place] [address] ?  ?  ? ? ? ? ? ?Radiology Studies: ?CARDIAC CATHETERIZATION ? ?Result Date: 08/07/2021 ?Conclusions: Diffuse moderate-severe, multivessel coronary artery disease, including chronic total occlusion of mid RCA, diffuse plaquing of LAD with up to 60% stenosis in the mid vessel, 60-70% proximal/mid LCx stenosis, and 90% lesion of OM1. Moderately elevated left heart filling pressures (LVEDP/PCWP 25 mmHg). Severely elevated right heart filling pressures (mean RA 16 mmHg, RVEDP 30 mmHg). Moderate-severe pulmonary hypertension (mean PAP 42 mmHg, PVR 4.0 WU). Moderately reduced Fick cardiac output/index (CO 4.2 L/min,  CI 2.0 L/min/m^2).Marland Kitchen Recommendations: Suspect cardiomyopathy is a combination of ischemic and nonischemic etiologies confounded by significant pulmonary hypertension and right heart failure that may be due

## 2021-08-09 DIAGNOSIS — R0602 Shortness of breath: Secondary | ICD-10-CM

## 2021-08-09 LAB — BASIC METABOLIC PANEL
Anion gap: 10 (ref 5–15)
BUN: 32 mg/dL — ABNORMAL HIGH (ref 6–20)
CO2: 27 mmol/L (ref 22–32)
Calcium: 9.2 mg/dL (ref 8.9–10.3)
Chloride: 102 mmol/L (ref 98–111)
Creatinine, Ser: 1.03 mg/dL (ref 0.61–1.24)
GFR, Estimated: >60 mL/min (ref >60)
Glucose, Bld: 111 mg/dL — ABNORMAL HIGH (ref 70–99)
Potassium: 4.2 mmol/L (ref 3.5–5.1)
Sodium: 139 mmol/L (ref 135–145)

## 2021-08-09 MED ORDER — SACUBITRIL-VALSARTAN 49-51 MG PO TABS
1.0000 | ORAL_TABLET | Freq: Two times a day (BID) | ORAL | Status: DC
  Administered 2021-08-09 – 2021-08-10 (×3): 1 via ORAL
  Filled 2021-08-09 (×3): qty 1

## 2021-08-09 MED ORDER — ONDANSETRON HCL 4 MG/2ML IJ SOLN
4.0000 mg | Freq: Four times a day (QID) | INTRAMUSCULAR | Status: DC | PRN
  Administered 2021-08-09: 4 mg via INTRAVENOUS

## 2021-08-09 NOTE — Progress Notes (Signed)
?PROGRESS NOTE ? ? ? ?HONG MORING  FIE:[id number] DOB: [birth year] DOA: 08/04/2021 ?PCP: Lynnell Jude, MD  ? ? ?Assessment & Plan: ?  ?Principal Problem: ?  Acute HFrEF (heart failure with reduced ejection fraction) ([place]) ?Active Problems: ?  Atypical pneumonia ?  Essential hypertension ?  Crohn's disease ([place]) ?  CKD (chronic kidney disease) stage 2, GFR 60-89 ml/min ?  Elevated troponin ? ? ?Acute systolic CHF: w/ cardiomyopathy (unknown type) as per cardio. echo shows EF 30-35%. Monitor I/Os. S/p cardiac cath which showed mod-severe multivessel disease that will be treated medically currently as per cardio. Continue on IV lasix & likely switch to po tomorrow as per cardio. Continue on entresto, aldactone. No beta blocker currently secondary to bradycardia  ? ?Atypical pneumonia: completed abx course. Continue on steroid taper and continue on bronchodilators. Encourage flutter valve & incentive spirometry  ? ?HTN: continue on entresto, aldactone.  ? ?Crohn's disease: w/ hx of ostomy. Continue w/ supportive care  ?  ?CKDII: Cr baseline around 1.29. Cr is better than baseline today  ?  ?Elevated troponin: mild & trending down. Likely secondary to demand ischemia  ? ?Obesity: BMI 30.1. Would benefit from weight loss ? ? ? ?DVT prophylaxis: lovenox  ?Code Status: full  ?Family Communication:  ?Disposition Plan: likely d/c back home  ? ?Level of care: Telemetry Cardiac ? ?Status is: Inpatient ?Remains inpatient appropriate because: still requiring IV lasix  ? ? ? ?Consultants:  ?Cardio  ? ?Procedures:  ? ?Antimicrobials:  ? ? ?Subjective: ?Pt c/o nausea ? ?Objective: ?Vitals:  ? 08/08/21 2325 08/09/21 0319 08/09/21 1660 08/09/21 0740  ?BP: (!) 145/101 (!) 152/113  (!) 151/113  ?Pulse: 65 70  67  ?Resp: 18 18  18   ?Temp: 97.8 ?F (36.6 ?C) 97.6 ?F (36.4 ?C)  98 ?F (36.7 ?C)  ?TempSrc:      ?SpO2: 96% 96% 97% 98%  ?Weight:      ?Height:      ? ? ?Intake/Output Summary (Last 24 hours) at 08/09/2021 1038 ?Last data filed  at 08/08/2021 2100 ?Gross per 24 hour  ?Intake 240 ml  ?Output 675 ml  ?Net -435 ml  ? ?Filed Weights  ? 08/07/21 0451 08/07/21 0720 08/08/21 0400  ?Weight: 96.9 kg 96.9 kg 95.4 kg  ? ? ?Examination: ? ?General exam: Appears uncomfortable   ?Respiratory system: diminished breath sounds b/l.  ?Cardiovascular system: S1 & S2+. No rubs or clicks  ?Gastrointestinal system: Abd is soft, NT, ND & hypoactive bowel sounds. ?Central nervous system: alert and oriented. Moves all extremities  ?Psychiatry: judgement and insight appears normal. Flat mood and affect  ? ? ? ?Data Reviewed: I have personally reviewed following labs and imaging studies ? ?CBC: ?Recent Labs  ?Lab 08/04/21 ?1050 08/05/21 ?6301 08/07/21 ?6010 08/08/21 ?0507  ?WBC 7.2 7.8 9.0 8.4  ?HGB 15.4 14.8 15.9 16.8  ?HCT 46.0 43.2 48.1 49.6  ?MCV 84.6 83.6 83.7 83.5  ?PLT 185 185 205 219  ? ?Basic Metabolic Panel: ?Recent Labs  ?Lab 08/05/21 ?9323 08/06/21 ?0427 08/07/21 ?5573 08/08/21 ?0507 08/09/21 ?0403  ?NA 141 140 141 142 139  ?K 3.7 4.1 4.2 4.2 4.2  ?CL 104 105 104 104 102  ?CO2 27 27 28 27 27   ?GLUCOSE 116* 125* 113* 152* 111*  ?BUN 29* 30* 37* 34* 32*  ?CREATININE 1.46* 1.33* 1.40* 1.30* 1.03  ?CALCIUM 8.9 8.7* 9.2 9.0 9.2  ?MG 2.1 2.3  --   --   --   ? ?  GFR: ?Estimated Creatinine Clearance: 97.3 mL/min (by C-G formula based on SCr of 1.03 mg/dL). ?Liver Function Tests: ?No results for input(s): AST, ALT, ALKPHOS, BILITOT, PROT, ALBUMIN in the last 168 hours. ?No results for input(s): LIPASE, AMYLASE in the last 168 hours. ?No results for input(s): AMMONIA in the last 168 hours. ?Coagulation Profile: ?No results for input(s): INR, PROTIME in the last 168 hours. ?Cardiac Enzymes: ?No results for input(s): CKTOTAL, CKMB, CKMBINDEX, TROPONINI in the last 168 hours. ?BNP (last 3 results) ?No results for input(s): PROBNP in the last 8760 hours. ?HbA1C: ?No results for input(s): HGBA1C in the last 72 hours. ? ?CBG: ?No results for input(s): GLUCAP in the last  168 hours. ?Lipid Profile: ?No results for input(s): CHOL, HDL, LDLCALC, TRIG, CHOLHDL, LDLDIRECT in the last 72 hours. ? ?Thyroid Function Tests: ?No results for input(s): TSH, T4TOTAL, FREET4, T3FREE, THYROIDAB in the last 72 hours. ? ?Anemia Panel: ?No results for input(s): VITAMINB12, FOLATE, FERRITIN, TIBC, IRON, RETICCTPCT in the last 72 hours. ?Sepsis Labs: ?Recent Labs  ?Lab 08/05/21 ?6629  ?PROCALCITON 0.14  ? ? ?Recent Results (from the past 240 hour(s))  ?Resp Panel by RT-PCR (Flu A&B, Covid) Nasopharyngeal Swab     Status: None  ? Collection Time: 08/04/21 10:50 AM  ? Specimen: Nasopharyngeal Swab; Nasopharyngeal(NP) swabs in vial transport medium  ?Result Value Ref Range Status  ? SARS Coronavirus 2 by RT PCR NEGATIVE NEGATIVE Final  ?  Comment: (NOTE) ?SARS-CoV-2 target nucleic acids are NOT DETECTED. ? ?The SARS-CoV-2 RNA is generally detectable in upper respiratory ?specimens during the acute phase of infection. The lowest ?concentration of SARS-CoV-2 viral copies this assay can detect is ?138 copies/mL. A negative result does not preclude SARS-Cov-2 ?infection and should not be used as the sole basis for treatment or ?other patient management decisions. A negative result may occur with  ?improper specimen collection/handling, submission of specimen other ?than nasopharyngeal swab, presence of viral mutation(s) within the ?areas targeted by this assay, and inadequate number of viral ?copies(<138 copies/mL). A negative result must be combined with ?clinical observations, patient history, and epidemiological ?information. The expected result is Negative. ? ?Fact Sheet for Patients:  ?EntrepreneurPulse.com.au ? ?Fact Sheet for Healthcare Providers:  ?IncredibleEmployment.be ? ?This test is no t yet approved or cleared by the [place] FDA and  ?has been authorized for detection and/or diagnosis of SARS-CoV-2 by ?FDA under an Emergency Use Authorization (EUA). This  EUA will remain  ?in effect (meaning this test can be used) for the duration of the ?COVID-19 declaration under Section 564(b)(1) of the Act, 21 ?U.S.C.section 360bbb-3(b)(1), unless the authorization is terminated  ?or revoked sooner.  ? ? ?  ? Influenza A by PCR NEGATIVE NEGATIVE Final  ? Influenza B by PCR NEGATIVE NEGATIVE Final  ?  Comment: (NOTE) ?The Xpert Xpress SARS-CoV-2/FLU/RSV plus assay is intended as an aid ?in the diagnosis of influenza from Nasopharyngeal swab specimens and ?should not be used as a sole basis for treatment. Nasal washings and ?aspirates are unacceptable for Xpert Xpress SARS-CoV-2/FLU/RSV ?testing. ? ?Fact Sheet for Patients: ?EntrepreneurPulse.com.au ? ?Fact Sheet for Healthcare Providers: ?IncredibleEmployment.be ? ?This test is not yet approved or cleared by the [place] FDA and ?has been authorized for detection and/or diagnosis of SARS-CoV-2 by ?FDA under an Emergency Use Authorization (EUA). This EUA will remain ?in effect (meaning this test can be used) for the duration of the ?COVID-19 declaration under Section 564(b)(1) of the Act, 21 U.S.C. ?section 360bbb-3(b)(1), unless the authorization  is terminated or ?revoked. ? ?Performed at [facility], [place], ?[place] [address] ?  ?  ? ? ? ? ? ?Radiology Studies: ?No results found. ? ? ? ? ? ?Scheduled Meds: ? albuterol  2.5 mg Nebulization TID  ? aspirin EC  81 mg Oral Daily  ? atorvastatin  80 mg Oral QPM  ? dapagliflozin propanediol  10 mg Oral Daily  ? enoxaparin (LOVENOX) injection  0.5 mg/kg Subcutaneous QHS  ? furosemide  40 mg Intravenous BID  ? guaiFENesin  1,200 mg Oral BID  ? methylPREDNISolone (SOLU-MEDROL) injection  30 mg Intravenous Q24H  ? potassium chloride  20 mEq Oral Daily  ? sacubitril-valsartan  1 tablet Oral BID  ? sodium chloride flush  3 mL Intravenous Q12H  ? spironolactone  25 mg Oral Daily  ? ?Continuous Infusions: ? sodium chloride     ? ? ? LOS: 5 days  ? ? ?Time spent: 20 mins  ? ? ? ?Wyvonnia Dusky, MD ?[facility] ?Pager [phone] ? ?If 7PM-7AM, please contact night-coverage ?08/09/2021, 10:38 AM  ? ?

## 2021-08-09 NOTE — Progress Notes (Signed)
Mobility Specialist - Progress Note ? ? 08/09/21 1200  ?Mobility  ?Activity Ambulated independently in hallway;Stood at bedside;Dangled on edge of bed  ?Level of Assistance Independent  ?Assistive Device None  ?Distance Ambulated (ft) 500 ft  ?Activity Response Tolerated well  ?$Mobility charge 1 Mobility  ? ? ?Pre-mobility: 82 HR, 94% SpO2 ?During mobility: 83 HR, 97% SpO2 ? ?Pt supine upon arrival using RA. Pt completes bed mobility, STS and ambulation indep voicing no complaints. Pt returns to room with needs in reach. ? ?Merrily Brittle ?Mobility Specialist ?08/09/21, 12:16 PM ? ? ? ? ?

## 2021-08-09 NOTE — Progress Notes (Signed)
? ?Cardiology Progress Note  ? ?Patient Name: Clarence [name] ?Date of Encounter: 08/09/2021 ? ?Primary Cardiologist: Nelva Bush, MD ? ?Subjective  ? ?Had a shooting pain across his back last night.  Woke up nauseated this morning.  Currently feels well.  Breathing improving.  Ongoing oral secretions but significant improvement in wheezing. ? ?Inpatient Medications  ?  ?Scheduled Meds: ? albuterol  2.5 mg Nebulization TID  ? aspirin EC  81 mg Oral Daily  ? atorvastatin  80 mg Oral QPM  ? dapagliflozin propanediol  10 mg Oral Daily  ? enoxaparin (LOVENOX) injection  0.5 mg/kg Subcutaneous QHS  ? furosemide  40 mg Intravenous BID  ? guaiFENesin  1,200 mg Oral BID  ? methylPREDNISolone (SOLU-MEDROL) injection  30 mg Intravenous Q24H  ? potassium chloride  20 mEq Oral Daily  ? sacubitril-valsartan  1 tablet Oral BID  ? sodium chloride flush  3 mL Intravenous Q12H  ? spironolactone  25 mg Oral Daily  ? ?Continuous Infusions: ? sodium chloride    ? ?PRN Meds: ?sodium chloride, chlorpheniramine-HYDROcodone, ondansetron, phenol, sodium chloride flush  ? ?Vital Signs  ?  ?Vitals:  ? 08/08/21 2325 08/09/21 0319 08/09/21 2751 08/09/21 0740  ?BP: (!) 145/101 (!) 152/113  (!) 151/113  ?Pulse: 65 70  67  ?Resp: 18 18  18   ?Temp: 97.8 ?F (36.6 ?C) 97.6 ?F (36.4 ?C)  98 ?F (36.7 ?C)  ?TempSrc:      ?SpO2: 96% 96% 97% 98%  ?Weight:      ?Height:      ? ? ?Intake/Output Summary (Last 24 hours) at 08/09/2021 0807 ?Last data filed at 08/08/2021 2100 ?Gross per 24 hour  ?Intake 240 ml  ?Output 675 ml  ?Net -435 ml  ? ?Filed Weights  ? 08/07/21 0451 08/07/21 0720 08/08/21 0400  ?Weight: 96.9 kg 96.9 kg 95.4 kg  ? ? ?Physical Exam  ? ?GEN: Well nourished, well developed, in no acute distress.  ?HEENT: Grossly normal.  ?Neck: Supple, no JVD, carotid bruits, or masses. ?Cardiac: RRR, no murmurs, rubs, or gallops. No clubbing, cyanosis, edema.  Radials 2+, DP/PT 2+ and equal bilaterally.  ?Respiratory:  Respirations regular and  unlabored, scattered rhonchi but I do not appreciate wheezing this morning. ?GI: Soft, nontender, nondistended, BS + x 4.  Right upper quadrant colostomy. ?MS: no deformity or atrophy. ?Skin: warm and dry, no rash. ?Neuro:  Strength and sensation are intact. ?Psych: AAOx3.  Normal affect. ? ?Labs  ?  ?Chemistry ?Recent Labs  ?Lab 08/07/21 ?7001 08/08/21 ?0507 08/09/21 ?0403  ?NA 141 142 139  ?K 4.2 4.2 4.2  ?CL 104 104 102  ?CO2 28 27 27   ?GLUCOSE 113* 152* 111*  ?BUN 37* 34* 32*  ?CREATININE 1.40* 1.30* 1.03  ?CALCIUM 9.2 9.0 9.2  ?GFRNONAA >60 >60 >60  ?ANIONGAP 9 11 10   ?  ? ?Hematology ?Recent Labs  ?Lab 08/05/21 ?7494 08/07/21 ?4967 08/08/21 ?0507  ?WBC 7.8 9.0 8.4  ?RBC 5.17 5.75 5.94*  ?HGB 14.8 15.9 16.8  ?HCT 43.2 48.1 49.6  ?MCV 83.6 83.7 83.5  ?MCH 28.6 27.7 28.3  ?MCHC 34.3 33.1 33.9  ?RDW 14.5 14.5 14.0  ?PLT 185 205 219  ? ? ?Cardiac Enzymes  ?Recent Labs  ?Lab 08/04/21 ?1050 08/04/21 ?1742  ?TROPONINIHS 89* 80*  ?   ? ?BNP ?   ?Component Value Date/Time  ? BNP 559.8 (H) 08/04/2021 1050  ? ? ?Lipids  ?Lab Results  ?Component Value Date  ? CHOL 207 (  H) 08/05/2021  ? HDL 34 (L) 08/05/2021  ? LDLCALC 134 (H) 08/05/2021  ? TRIG 196 (H) 08/05/2021  ? CHOLHDL 6.1 08/05/2021  ? ? ?HbA1c  ?Lab Results  ?Component Value Date  ? HGBA1C 5.4 08/05/2021  ? ? ?Radiology  ? ?------------------------- ?  ?Telemetry  ?  ?Sinus bradycardia to sinus rhythm with rare PVCs.  Heart rates occasionally drop into the high 30s while sleeping - Personally Reviewed ? ?Cardiac Studies  ? ?Echo 08/05/21 ?  ? 1. Left ventricular ejection fraction, by estimation, is 30 to 35%. The  ?left ventricle has moderately decreased function. The left ventricle  ?demonstrates global hypokinesis. There is moderate left ventricular  ?hypertrophy. Left ventricular diastolic  ?parameters are consistent with Grade II diastolic dysfunction  ?(pseudonormalization).  ? 2. Right ventricular systolic function is normal. The right ventricular  ?size is  normal. Tricuspid regurgitation signal is inadequate for assessing  ?PA pressure.  ? 3. Left atrial size was mildly dilated.  ? 4. The mitral valve is abnormal. Mild to moderate mitral valve  ?regurgitation. No evidence of mitral stenosis.  ? 5. The aortic valve has an indeterminant number of cusps. Aortic valve  ?regurgitation is not visualized. No aortic stenosis is present.  ? 6. The inferior vena cava is dilated in size with <50% respiratory  ?variability, suggesting right atrial pressure of 15 mmHg.  ?_____________  ?  ?Cardiac Catheterization  3.23.2023 ?  ?Diagnostic ?Dominance: Right ?Conclusions: ?Diffuse moderate-severe, multivessel coronary artery disease, including chronic total occlusion of mid RCA, diffuse plaquing of LAD with up to 60% stenosis in the mid vessel, 60-70% proximal/mid LCx stenosis, and 90% lesion of OM1. ?Moderately elevated left heart filling pressures (LVEDP/PCWP 25 mmHg). ?Severely elevated right heart filling pressures (mean RA 16 mmHg, RVEDP 30 mmHg). ?Moderate-severe pulmonary hypertension (mean PAP 42 mmHg, PVR 4.0 WU). ?Moderately reduced Fick cardiac output/index (CO 4.2 L/min, CI 2.0 L/min/m^2).. ?  ?Recommendations: ?Suspect cardiomyopathy is a combination of ischemic and nonischemic etiologies confounded by significant pulmonary hypertension and right heart failure that may be due to underlying lung disease.  Recommend continued diuresis and optimization of GDMT for acute HFrEF.  In the setting of ongoing wheezing with concern for obstructive lung disease, I will hold carvedilol for now and add Entresto. ?Secondary prevention of coronary artery disease, including high-intensity statin therapy.  Diffuse nature of CAD is not ideal for percutaneous or surgical revascularization.  Mild troponin elevation on admission is most likely due to demand ischemia in the setting of acute HFrEF and chronic ischemic heart disease. ?_____________  ? ?Patient Profile  ?   ?51 y.o. male w/ a  h/o Crohn's dzs s/p colectomy w Oneta Rack and HTN, who was admitted 3/20 w/ progressive dyspnea, and found to have an EF of 30-35% w/ atypical PNA. Cath 3/23 w/ occluded RCA w/ L  Collats, severe OM1 dzs, and elevated filling pressures/PAH. ? ?Assessment & Plan  ?  ?1.  Acute respiratory failure/atypical pneumonia: Still with some secretions overall, lungs are improving.  No wheezing this morning-scattered rhonchi.  Antibiotics, steroids, inhalers/nebulizers per internal medicine. ? ?2.  Acute systolic CHF/ischemic cardiomyopathy/pulmonary arterial hypertension: EF 30-35% by echo this admission.  Minus 785 yesterday and 6.2L since admission.  Renal function continues to improve.  Would continue IV diuresis today to optimize his lung status as best we can.  Perhaps we can switch to oral diuretic tomorrow.  Titrating Entresto in the setting of ongoing hypertension.  Though wheezing improved, he has been  bradycardic at times with rates dropping into the high 30s-defer beta-blocker for now.  He will need an outpatient sleep study.  Continue spironolactone and SGLT2 inhibitor. ? ?3.  Essential hypertension: Blood pressure elevated this morning.  Spironolactone added yesterday.  Titrating Entresto today. ? ?4.  Stage II chronic kidney disease: Creatinine improving with diuresis.  Continue to follow with ongoing diuresis, titration of Entresto, and spironolactone therapy. ? ?5.  Nocturnal bradycardia: We will need outpatient sleep study. ? ?6.  Right lung nodule opacity: Noted on chest x-ray and CTA.  Will need follow-up imaging in 1 to 3 months. ? ?7.  Crohn's disease: Status post colostomy. ? ?Signed, ?Murray Hodgkins, NP  ?08/09/2021, 8:07 AM   ? ?For questions or updates, please contact   ?Please consult [email or web address] for contact info under Cardiology/STEMI.  ?

## 2021-08-09 NOTE — Progress Notes (Signed)
Clarence Hodgkins NP and Dr. Jimmye Norman notified that patient HR dropping into 30s occasionally. Comes right back to 50s-80s sinus bradycardia/sinus rhythm.  ?No change in orders at this time.  ?

## 2021-08-10 DIAGNOSIS — I130 Hypertensive heart and chronic kidney disease with heart failure and stage 1 through stage 4 chronic kidney disease, or unspecified chronic kidney disease: Secondary | ICD-10-CM

## 2021-08-10 DIAGNOSIS — I25118 Atherosclerotic heart disease of native coronary artery with other forms of angina pectoris: Secondary | ICD-10-CM

## 2021-08-10 DIAGNOSIS — I420 Dilated cardiomyopathy: Secondary | ICD-10-CM

## 2021-08-10 LAB — BASIC METABOLIC PANEL
Anion gap: 10 (ref 5–15)
BUN: 38 mg/dL — ABNORMAL HIGH (ref 6–20)
CO2: 26 mmol/L (ref 22–32)
Calcium: 9.4 mg/dL (ref 8.9–10.3)
Chloride: 102 mmol/L (ref 98–111)
Creatinine, Ser: 1.29 mg/dL — ABNORMAL HIGH (ref 0.61–1.24)
GFR, Estimated: >60 mL/min (ref >60)
Glucose, Bld: 144 mg/dL — ABNORMAL HIGH (ref 70–99)
Potassium: 4.2 mmol/L (ref 3.5–5.1)
Sodium: 138 mmol/L (ref 135–145)

## 2021-08-10 MED ORDER — SPIRONOLACTONE 25 MG PO TABS: 25.0000 mg | ORAL_TABLET | Freq: Every day | ORAL | 0 refills | Status: DC

## 2021-08-10 MED ORDER — ONDANSETRON 4 MG PO TBDP
4.0000 mg | ORAL_TABLET | Freq: Three times a day (TID) | ORAL | 0 refills | Status: AC | PRN
Start: 2021-08-10 — End: 2021-08-17

## 2021-08-10 MED ORDER — ATORVASTATIN CALCIUM 80 MG PO TABS: 80.0000 mg | ORAL_TABLET | Freq: Every evening | ORAL | 0 refills | Status: DC

## 2021-08-10 MED ORDER — FUROSEMIDE 40 MG PO TABS
40.0000 mg | ORAL_TABLET | Freq: Every day | ORAL | 0 refills | Status: DC
Start: 2021-08-11 — End: 2021-09-11

## 2021-08-10 MED ORDER — SACUBITRIL-VALSARTAN 49-51 MG PO TABS: 1.0000 | ORAL_TABLET | Freq: Two times a day (BID) | ORAL | 0 refills | Status: DC

## 2021-08-10 MED ORDER — DAPAGLIFLOZIN PROPANEDIOL 10 MG PO TABS: 10.0000 mg | ORAL_TABLET | Freq: Every day | ORAL | 0 refills | Status: DC

## 2021-08-10 MED ORDER — ASPIRIN 81 MG PO TBEC: 81.0000 mg | DELAYED_RELEASE_TABLET | Freq: Every day | ORAL | 0 refills | Status: DC

## 2021-08-10 MED ORDER — FUROSEMIDE 40 MG PO TABS: 40.0000 mg | ORAL_TABLET | Freq: Every day | ORAL | Status: DC

## 2021-08-10 MED ORDER — PREDNISONE 10 MG PO TABS: 30.0000 mg | ORAL_TABLET | Freq: Every day | ORAL | 0 refills | Status: AC

## 2021-08-10 NOTE — TOC Progression Note (Addendum)
Transition of Care (TOC) - Progression Note  ? ? ?Patient Details  ?Name: Clarence [name] ?MRN: [id number] ?Date of Birth: [birth year] ? ?Transition of Care (TOC) CM/SW Contact  ?Ebonique Hallstrom, LCSW ?Phone Number: [phone] ?08/10/2021, 10:03 AM ? ?Clinical Narrative:    ? ?Plan is for d/c today 08/10/2021, pending cardiology eval. CSW spoke with patient to confirm he would like to attend Outpatient PT after discharge.  Patient stated he would.  CSW inquired about transportation and patient stated he would drive himself home, his vehicle is in the parking lot.CSW will provide referral to Attending for signature.  ? ?  ?  ? ?Expected Discharge Plan and Services ?  ?  ?  ?  ?  ?                ?  ?  ?  ?  ?  ?  ?  ?  ?  ?  ? ? ?Social Determinants of Health (SDOH) Interventions ?  ? ?Readmission Risk Interventions ?   ? View : No data to display.  ?  ?  ?  ? ? ?

## 2021-08-10 NOTE — Plan of Care (Signed)

## 2021-08-10 NOTE — Progress Notes (Signed)
? ?Progress Note ? ?Patient Name: Clarence [name] ?Date of Encounter: 08/10/2021 ? ?[facility] Cardiologist: Nelva Bush, MD  ? ?Subjective  ? ?Still with cough but slowly improving ?Shortness of breath also improving ?Denies abdominal distention, no leg swelling ?Able to ambulate more ? ?Inpatient Medications  ?  ?Scheduled Meds: ? albuterol  2.5 mg Nebulization TID  ? aspirin EC  81 mg Oral Daily  ? atorvastatin  80 mg Oral QPM  ? dapagliflozin propanediol  10 mg Oral Daily  ? enoxaparin (LOVENOX) injection  0.5 mg/kg Subcutaneous QHS  ? [START ON 08/11/2021] furosemide  40 mg Oral Daily  ? guaiFENesin  1,200 mg Oral BID  ? methylPREDNISolone (SOLU-MEDROL) injection  30 mg Intravenous Q24H  ? potassium chloride  20 mEq Oral Daily  ? sacubitril-valsartan  1 tablet Oral BID  ? sodium chloride flush  3 mL Intravenous Q12H  ? spironolactone  25 mg Oral Daily  ? ?Continuous Infusions: ? sodium chloride    ? ?PRN Meds: ?sodium chloride, chlorpheniramine-HYDROcodone, ondansetron (ZOFRAN) IV, ondansetron, phenol, sodium chloride flush  ? ?Vital Signs  ?  ?Vitals:  ? 08/10/21 0412 08/10/21 0500 08/10/21 0736 08/10/21 1140  ?BP: (!) 129/92  (!) 133/94 107/75  ?Pulse: 77  80 77  ?Resp: 18  19 19   ?Temp: 98.3 ?F (36.8 ?C)  98.5 ?F (36.9 ?C) 98.3 ?F (36.8 ?C)  ?TempSrc: Oral  Oral   ?SpO2: 96%  97% 97%  ?Weight:  95.4 kg    ?Height:      ? ? ?Intake/Output Summary (Last 24 hours) at 08/10/2021 1333 ?Last data filed at 08/10/2021 1139 ?Gross per 24 hour  ?Intake 959 ml  ?Output 3315 ml  ?Net -2356 ml  ? ? ?  08/10/2021  ?  5:00 AM 08/08/2021  ?  4:00 AM 08/07/2021  ?  7:20 AM  ?Last 3 Weights  ?Weight (lbs) 210 lb 5.1 oz 210 lb 5.1 oz 213 lb 10 oz  ?Weight (kg) 95.4 kg 95.4 kg 96.9 kg  ?   ? ?Telemetry  ?  ?Normal sinus rhythm- Personally Reviewed ? ?ECG  ?  ?- Personally Reviewed ? ?Physical Exam  ? ?GEN: No acute distress.   ?Neck: No JVD ?Cardiac: RRR, no murmurs, rubs, or gallops.  ?Respiratory: Scattered Rales, scant  wheezing ?GI: Soft, nontender, non-distended  ?MS: No edema; No deformity. ?Neuro:  Nonfocal  ?Psych: Normal affect  ? ?Labs  ?  ?High Sensitivity Troponin:   ?Recent Labs  ?Lab 08/04/21 ?1050 08/04/21 ?1742  ?TROPONINIHS 89* 80*  ?   ?Chemistry ?Recent Labs  ?Lab 08/05/21 ?1093 08/06/21 ?0427 08/07/21 ?2355 08/08/21 ?0507 08/09/21 ?0403 08/10/21 ?7322  ?NA 141 140   < > 142 139 138  ?K 3.7 4.1   < > 4.2 4.2 4.2  ?CL 104 105   < > 104 102 102  ?CO2 27 27   < > 27 27 26   ?GLUCOSE 116* 125*   < > 152* 111* 144*  ?BUN 29* 30*   < > 34* 32* 38*  ?CREATININE 1.46* 1.33*   < > 1.30* 1.03 1.29*  ?CALCIUM 8.9 8.7*   < > 9.0 9.2 9.4  ?MG 2.1 2.3  --   --   --   --   ?GFRNONAA 58* >60   < > >60 >60 >60  ?ANIONGAP 10 8   < > 11 10 10   ? < > = values in this interval not displayed.  ?  ?Lipids  ?  Recent Labs  ?Lab 08/05/21 ?4801  ?CHOL 207*  ?TRIG 196*  ?HDL 34*  ?LDLCALC 134*  ?CHOLHDL 6.1  ?  ?Hematology ?Recent Labs  ?Lab 08/05/21 ?6553 08/07/21 ?7482 08/08/21 ?0507  ?WBC 7.8 9.0 8.4  ?RBC 5.17 5.75 5.94*  ?HGB 14.8 15.9 16.8  ?HCT 43.2 48.1 49.6  ?MCV 83.6 83.7 83.5  ?MCH 28.6 27.7 28.3  ?MCHC 34.3 33.1 33.9  ?RDW 14.5 14.5 14.0  ?PLT 185 205 219  ? ?Thyroid  ?Recent Labs  ?Lab 08/04/21 ?1742  ?TSH 0.258*  ?  ?BNP ?Recent Labs  ?Lab 08/04/21 ?1050  ?BNP 559.8*  ?  ?DDimer No results for input(s): DDIMER in the last 168 hours.  ? ?Radiology  ?  ?No results found. ? ?Cardiac Studies  ? ?Echocardiogram ? 1. Left ventricular ejection fraction, by estimation, is 30 to 35%. The  ?left ventricle has moderately decreased function. The left ventricle  ?demonstrates global hypokinesis. There is moderate left ventricular  ?hypertrophy. Left ventricular diastolic  ?parameters are consistent with Grade II diastolic dysfunction  ?(pseudonormalization).  ? 2. Right ventricular systolic function is normal. The right ventricular  ?size is normal. Tricuspid regurgitation signal is inadequate for assessing  ?PA pressure.  ? 3. Left atrial size  was mildly dilated.  ? 4. The mitral valve is abnormal. Mild to moderate mitral valve  ?regurgitation. No evidence of mitral stenosis.  ? 5. The aortic valve has an indeterminant number of cusps. Aortic valve  ?regurgitation is not visualized. No aortic stenosis is present.  ? 6. The inferior vena cava is dilated in size with <50% respiratory  ?variability, suggesting right atrial pressure of 15 mmHg.  ? ?Cardiac catheterization ?Diffuse moderate-severe, multivessel coronary artery disease, including chronic total occlusion of mid RCA, diffuse plaquing of LAD with up to 60% stenosis in the mid vessel, 60-70% proximal/mid LCx stenosis, and 90% lesion of OM1. ?Moderately elevated left heart filling pressures (LVEDP/PCWP 25 mmHg). ?Severely elevated right heart filling pressures (mean RA 16 mmHg, RVEDP 30 mmHg). ?Moderate-severe pulmonary hypertension (mean PAP 42 mmHg, PVR 4.0 WU). ?Moderately reduced Fick cardiac output/index (CO 4.2 L/min, CI 2.0 L/min/m^2).. ? ?Patient Profile  ?   ?51 y.o. male w/ a h/o Crohn's dzs s/p colectomy w /ostomy and HTN, who was admitted 3/20 w/ progressive dyspnea, and found to have an EF of 30-35% w/ atypical PNA. Cath 3/23 w/ occluded RCA w/ L  Collats, severe OM1 dzs, and elevated filling pressures/PAH. ? ?Assessment & Plan  ?  ?Acute systolic CHF ?In the setting of nonischemic and ischemic cardiomyopathy, pulmonary hypertension ?Ejection fraction 30 to 35% ?Cardiac catheterization August 07, 2021 occluded RCA with collaterals from left to right, OM1 disease ?-Presenting with acute on chronic systolic CHF and cardiorenal syndrome ?-Beta-blocker on hold secondary to bradycardia ?-Continue spironolactone, SGLT2 inhibitor, Entresto, Lasix ?Appears euvolemic, BUN/creatinine starting to trend upwards ?Low blood pressure limiting further medication titration ?-We will hold the IV Lasix, transition to Lasix 40 daily ?Long discussion concerning CHF management, moderating his fluid intake,  daily weights, close follow-up in clinic ?Stressed importance of aggressive blood pressure control ?  ?Acute respiratory failure/bronchitis/atypical pneumonia ?On ceftriaxone, nebulizers ?Cough and sputum improving ?  ?Coronary disease with stable angina ?Not on beta-blocker secondary to bradycardia ?On aspirin, statin ? ?[facility] will sign off.   ?Medication Recommendations:  Medications as above ?Other recommendations (labs, testing, etc):  No further testing at this time ?Follow up as an outpatient: We will arrange follow-up with Dr. Saunders Revel ? ? ?  Long discussion with him concerning CHF management, follow-up, management of coronary disease and CHF ? Total encounter time more than 50 minutes ? Greater than 50% was spent in counseling and coordination of care with the patient ? ? ?For questions or updates, please contact [place] ?Please consult [email or web address] for contact info under  ? ?  ?   ?Signed, ?Ida Rogue, MD  ?08/10/2021, 1:33 PM    ?

## 2021-08-10 NOTE — Discharge Summary (Addendum)
Physician Discharge Summary  ?Clarence [name]:[id number] DOB: 05/26/1970 DOA: 08/04/2021 ? ?PCP: Lynnell Jude, MD ? ?Admit date: 08/04/2021 ?Discharge date: 08/10/2021 ? ?Admitted From: home  ?Disposition:  home  ? ?Recommendations for Outpatient Follow-up:  ?Follow up with PCP in 1-2 weeks ?F/u cardio, Dr. Saunders Revel, in 1-2 weeks ? ?Home Health: no  ?Equipment/Devices: ? ?Discharge Condition: stable  ?CODE STATUS: full  ?Diet recommendation: Heart Healthy ? ?Brief/Interim Summary: ?HPI was taken from Dr. Si Raider: ?Clarence [name] is a 51 y.o. male with medical history significant for crohn's disease and htn who presents with the above. ?  ?Says that for several months he has noted progressively worsening dyspnea on exertion as well as orthopnea and lower extremity swelling. Says he saw a nephrologist recently and told proteinuria was the cause of his swelling. Reports compliance with home meds but is unsure what he takes. Denies chest pain. No fevers. Has chronic cough, occasionally productive. Reports several days worsening dyspnea on exertion that triggered presenting to our ED.  ?  ?ED Course:  ?  ?Labs, imaging. Lasix 80 IV given as well as corticosteroid and azithromycin/ceftriaxone ? ? ?As per Dr. Leslye Peer: ?80 year old man with past medical history of Crohn's disease with ostomy and hypertension presents with shortness of breath especially with exertion.  Also has noticed some lower extremity swelling and orthopnea.  Patient was admitted to the hospital and treatment was started for CHF and pneumonia. ? ? ?As per Dr. Jimmye Norman 3/22-3/26/23: Pt was found to have acute systolic CHF w/ EF 15-40%. Pt had cardiac cath which showed mod-severe multivessel disease that will be treated medically currently as per cardio. Pt was treated w/ IV lasix, entresto, aldactone, farixga as per cardio. No beta blocker secondary to bradycardia. Of note, pt was also treated for atypical pneumonia w/ abxs, steroids & bronchodilators. Pt  completed the abx course while inpatient and was d/c home on po steroids to complete the course. For more information, please see previous progress/consult notes.  ? ?Discharge Diagnoses:  ?Principal Problem: ?  Acute HFrEF (heart failure with reduced ejection fraction) ([place]) ?Active Problems: ?  Atypical pneumonia ?  Essential hypertension ?  Crohn's disease ([place]) ?  CKD (chronic kidney disease) stage 2, GFR 60-89 ml/min ?  Elevated troponin ? ?Acute systolic CHF: w/ cardiomyopathy (unknown type) as per cardio. echo shows EF 30-35%. Monitor I/Os. S/p cardiac cath which showed mod-severe multivessel disease that will be treated medically currently as per cardio. Switched to po lasix today. Continue on entresto, aldactone & farxiga. No beta blocker currently secondary to bradycardia  ? ?Atypical pneumonia: completed abx course. Continue on steroid taper and continue on bronchodilators. Encourage flutter valve & incentive spirometry  ? ?HTN: continue on entresto, aldactone.  ? ?Crohn's disease: w/ hx of ostomy. Continue w/ supportive care  ?  ?CKDII: Cr baseline around 1.29. Cr is labile ?  ?Elevated troponin: mild & trending down. Likely secondary to demand ischemia  ? ?Obesity: BMI 30.1. Would benefit from weight loss ? ?Discharge Instructions ? ?Discharge Instructions   ? ? Diet - low sodium heart healthy   Complete by: As directed ?  ? Discharge instructions   Complete by: As directed ?  ? F/u w/ cardio, Dr. Saunders Revel, in 1-2 weeks. F/u w/ PCP in 1-2 weeks  ? Increase activity slowly   Complete by: As directed ?  ? ?  ? ?Allergies as of 08/10/2021   ? ?   Reactions  ? Ibuprofen Other (See  Comments), Hives  ? Other reaction(s): GI Upset (intolerance) ?Hives  ? Neomycin-bacitracin Zn-polymyx   ? Other reaction(s): Other (See Comments)  ? Sulfacetamide Other (See Comments)  ? ?  ? ?  ?Medication List  ?  ? ?STOP taking these medications   ? ?lisinopril-hydrochlorothiazide 20-25 MG tablet ?Commonly known as: ZESTORETIC ?   ? ?  ? ?TAKE these medications   ? ?aspirin 81 MG EC tablet ?Take 1 tablet (81 mg total) by mouth daily. Swallow whole. ?Start taking on: August 11, 2021 ?  ?atorvastatin 80 MG tablet ?Commonly known as: LIPITOR ?Take 1 tablet (80 mg total) by mouth every evening. ?  ?dapagliflozin propanediol 10 MG Tabs tablet ?Commonly known as: FARXIGA ?Take 1 tablet (10 mg total) by mouth daily. ?Start taking on: August 11, 2021 ?  ?furosemide 40 MG tablet ?Commonly known as: LASIX ?Take 1 tablet (40 mg total) by mouth daily. ?Start taking on: August 11, 2021 ?  ?ondansetron 4 MG disintegrating tablet ?Commonly known as: ZOFRAN-ODT ?Take 1 tablet (4 mg total) by mouth every 8 (eight) hours as needed for up to 7 days for nausea or vomiting. ?  ?predniSONE 10 MG tablet ?Commonly known as: DELTASONE ?Take 3 tablets (30 mg total) by mouth daily for 4 days. ?  ?sacubitril-valsartan 49-51 MG ?Commonly known as: ENTRESTO ?Take 1 tablet by mouth 2 (two) times daily. ?  ?spironolactone 25 MG tablet ?Commonly known as: ALDACTONE ?Take 1 tablet (25 mg total) by mouth daily. ?Start taking on: August 11, 2021 ?  ? ?  ? ? Follow-up Information   ? ? [name], Christopher, MD Follow up in 1 week(s).   ?Specialty: Cardiology ?Contact information: ?[address] ?[place] [address] ?[phone] ? ? ?  ?  ? ?  ?  ? ?  ? ?Allergies  ?Allergen Reactions  ? Ibuprofen Other (See Comments) and Hives  ?  Other reaction(s): GI Upset (intolerance) ?Hives ?  ? Neomycin-Bacitracin Zn-Polymyx   ?  Other reaction(s): Other (See Comments)  ? Sulfacetamide Other (See Comments)  ? ? ?Consultations: ?Cardio  ? ? ?Procedures/Studies: ?DG Chest 2 View ? ?Result Date: 08/04/2021 ?CLINICAL DATA:  A 51 year old male presents for evaluation of shortness of breath. EXAM: CHEST - 2 VIEW COMPARISON:  June 11, 2010. FINDINGS: EKG leads project over the chest. Trachea midline. Cardiomediastinal contours and hilar structures with cardiac enlargement and central  pulmonary vascular engorgement. Subtle opacity in the RIGHT upper chest. Generalized interstitial prominence, question early edema. No sign of pleural effusion. On limited assessment there is no acute skeletal process. IMPRESSION: 1. Cardiomegaly with central pulmonary vascular engorgement and interstitial prominence, question early edema mild asymmetry raising the question of infection. 2. No effusion. 3. Subtle opacity in the RIGHT upper chest, may represent developing infection, small nodule in this area is not excluded, in the acute setting this remains of uncertain significance. Suggest follow-up PA and lateral chest radiograph following therapy in 6-8 weeks to ensure resolution. Electronically Signed   By: Zetta Bills M.D.   On: 08/04/2021 11:58  ? ?CT Angio Chest PE W/Cm &/Or Wo Cm ? ?Result Date: 08/04/2021 ?CLINICAL DATA:  PE suspected, high probability. EXAM: CT ANGIOGRAPHY CHEST WITH CONTRAST TECHNIQUE: Multidetector CT imaging of the chest was performed using the standard protocol during bolus administration of intravenous contrast. Multiplanar CT image reconstructions and MIPs were obtained to evaluate the vascular anatomy. RADIATION DOSE REDUCTION: This exam was performed according to the departmental dose-optimization program which  includes automated exposure control, adjustment of the mA and/or kV according to patient size and/or use of iterative reconstruction technique. CONTRAST:  11m OMNIPAQUE IOHEXOL 350 MG/ML SOLN COMPARISON:  Chest radiograph June 11, 2010 FINDINGS: Cardiovascular: Satisfactory opacification of the pulmonary arteries to the segmental level. No evidence of pulmonary embolism. Normal caliber thoracic aorta. Cardiomegaly. No significant pericardial effusion/thickening. Mediastinum/Nodes: Mildly enlarged/prominent mediastinal and hilar lymph nodes. For reference: -Left paratracheal lymph node measures 1 cm in short axis on image 22/4 -Prevascular lymph node measures 1 cm in  short axis on image 39/4 -Right hilar lymph node measures 12 mm in short axis on image 57/4 Lungs/Pleura: Multifocal areas of clustered nodularity in the right lung for instance in the right upper lobe on

## 2021-08-10 NOTE — Progress Notes (Signed)
? Patient ID: Clarence [name], male    DOB: 06/13/1970, 51 y.o.   MRN: [id number] ? ?HPI ? ?Mr Alberto is a 51 y/o male with a history of HTN, CKD, Crohn's, tobacco use and chronic heart failure.  ? ?Echo report from 08/05/21 reviewed and showed an EF of 30-35% along with moderate LVH, mild LAE and mild/moderate MR.  ? ?LHC done 08/07/21 showed: ?Diffuse moderate-severe, multivessel coronary artery disease, including chronic total occlusion of mid RCA, diffuse plaquing of LAD with up to 60% stenosis in the mid vessel, 60-70% proximal/mid LCx stenosis, and 90% lesion of OM1. ?Moderately elevated left heart filling pressures (LVEDP/PCWP 25 mmHg). ?Severely elevated right heart filling pressures (mean RA 16 mmHg, RVEDP 30 mmHg). ?Moderate-severe pulmonary hypertension (mean PAP 42 mmHg, PVR 4.0 WU). ?Moderately reduced Fick cardiac output/index (CO 4.2 L/min, CI 2.0 L/min/m^2).. ? ?Admitted 08/04/21 due to worsening dyspnea on exertion as well as orthopnea and lower extremity swelling. Lasix 80 IV given as well as corticosteroid and azithromycin/ceftriaxone. Pt had cardiac cath which showed mod-severe multivessel disease that will be treated medically. Cardiology consult obtained. Elevated troponin thought to be due to demand ischemia. Discharged after 6 days.  ? ?He presents today for his initial visit with a chief complaint of moderate fatigue upon minimal exertion. Describes this as chronic in nature and "about the same" since recent admission. He has associated loose cough, shortness of breath, intermittent chest pain and light-headedness along with this. He denies any difficulty sleeping, abdominal distention, palpitations, pedal edema, head congestion or change in appetite.  ? ?Does not weigh himself as he doesn't have any scales. Not adding any salt to his food and has been trying to look at food labels. When asked about fluid intake, he says that he can drink ~ 6 water bottles daily (16.9 ounces each) and then  grapefruit or OJ. Says that when he's outside sweating, he will drink even more that that.  ? ?Asking about returning to work. 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 Does say that it's exertional work.  ? ?Past Medical History:  ?Diagnosis Date  ? CHF (congestive heart failure) ([place])   ? Chronic kidney disease   ? Crohn's disease ([place])   ? Hypertension   ? Proteinuria   ? ?Past Surgical History:  ?Procedure Laterality Date  ? COLECTOMY    ? RIGHT/LEFT HEART CATH AND CORONARY ANGIOGRAPHY N/A 08/07/2021  ? Procedure: RIGHT/LEFT HEART CATH AND CORONARY ANGIOGRAPHY;  Surgeon: Nelva Bush, MD;  Location: [facility];  Service: Cardiovascular;  Laterality: N/A;  ? ?Family History  ?Problem Relation Age of Onset  ? Heart disease Maternal Uncle   ? Pancreatic cancer Maternal Uncle   ? Cancer Maternal Aunt   ? ?Social History  ? ?Tobacco Use  ? Smoking status: Some Days  ?  Types: Cigars  ? Smokeless tobacco: Not on file  ?Substance Use Topics  ? Alcohol use: Not Currently  ?  Alcohol/week: 1.0 standard drink  ?  Types: 1 Standard drinks or equivalent per week  ? ?Allergies  ?Allergen Reactions  ? Ibuprofen Other (See Comments) and Hives  ?  Other reaction(s): GI Upset (intolerance) ?Hives ?  ? Neomycin-Bacitracin Zn-Polymyx   ?  Other reaction(s): Other (See Comments)  ? Sulfacetamide Other (See Comments)  ? ?Prior to Admission medications   ?Medication Sig Start Date End Date Taking? Authorizing Provider  ?  aspirin EC 81 MG EC tablet Take 1 tablet (81 mg total) by mouth daily. Swallow whole. 08/11/21 09/10/21 Yes Wyvonnia Dusky, MD  ?atorvastatin (LIPITOR) 80 MG tablet Take 1 tablet (80 mg total) by mouth every evening. 08/10/21 09/09/21 Yes Wyvonnia Dusky, MD  ?dapagliflozin propanediol (FARXIGA) 10 MG TABS tablet Take 1 tablet (10 mg total) by mouth daily. 08/11/21 09/10/21 Yes Wyvonnia Dusky, MD   ?furosemide (LASIX) 40 MG tablet Take 1 tablet (40 mg total) by mouth daily. 08/11/21 09/10/21 Yes Wyvonnia Dusky, MD  ?ondansetron (ZOFRAN-ODT) 4 MG disintegrating tablet Take 1 tablet (4 mg total) by mouth every 8 (eight) hours as needed for up to 7 days for nausea or vomiting. 08/10/21 08/17/21 Yes Wyvonnia Dusky, MD  ?predniSONE (DELTASONE) 10 MG tablet Take 3 tablets (30 mg total) by mouth daily for 4 days. 08/10/21 08/14/21 Yes Wyvonnia Dusky, MD  ?sacubitril-valsartan (ENTRESTO) 49-51 MG Take 1 tablet by mouth 2 (two) times daily. 08/10/21 09/09/21 Yes Wyvonnia Dusky, MD  ?spironolactone (ALDACTONE) 25 MG tablet Take 1 tablet (25 mg total) by mouth daily. 08/11/21 09/10/21 Yes Wyvonnia Dusky, MD  ? ?Review of Systems  ?Constitutional:  Positive for fatigue (easily). Negative for appetite change.  ?HENT:  Negative for congestion, postnasal drip and sore throat.   ?Eyes: Negative.   ?Respiratory:  Positive for cough and shortness of breath.   ?Cardiovascular:  Positive for chest pain (under left side lat night; resolved quickly). Negative for palpitations and leg swelling.  ?Gastrointestinal:  Negative for abdominal distention and abdominal pain.  ?Endocrine: Negative.   ?Genitourinary: Negative.   ?Musculoskeletal:  Negative for back pain and neck pain.  ?Skin: Negative.   ?Allergic/Immunologic: Negative.   ?Neurological:  Positive for light-headedness. Negative for dizziness.  ?Hematological:  Negative for adenopathy. Does not bruise/bleed easily.  ?Psychiatric/Behavioral:  Negative for dysphoric mood and sleep disturbance (sleeping on 2 pillows). The patient is not nervous/anxious.   ? ?Vitals:  ? 08/11/21 1154  ?BP: 113/82  ?Pulse: 70  ?Resp: 20  ?SpO2: 97%  ?Weight: 212 lb 4 oz (96.3 kg)  ?Height: 5' 10"  (1.778 m)  ? ?Wt Readings from Last 3 Encounters:  ?08/11/21 212 lb 4 oz (96.3 kg)  ?08/10/21 210 lb 5.1 oz (95.4 kg)  ? ?Lab Results  ?Component Value Date  ? CREATININE 1.29 (H) 08/10/2021   ? CREATININE 1.03 08/09/2021  ? CREATININE 1.30 (H) 08/08/2021  ? ? ?Physical Exam ?Vitals and nursing note reviewed.  ?Constitutional:   ?   Appearance: Normal appearance.  ?HENT:  ?   Head: Normocephalic and atraumatic.  ?Cardiovascular:  ?   Rate and Rhythm: Normal rate and regular rhythm.  ?Pulmonary:  ?   Effort: Pulmonary effort is normal. No respiratory distress.  ?   Breath sounds: No wheezing or rales.  ?Abdominal:  ?   General: There is no distension.  ?   Palpations: Abdomen is soft.  ?Musculoskeletal:     ?   General: No tenderness.  ?   Cervical back: Normal range of motion and neck supple.  ?   Right lower leg: No edema.  ?   Left lower leg: No edema.  ?Skin: ?   General: Skin is warm and dry.  ?Neurological:  ?   General: No focal deficit present.  ?   Mental Status: He is alert and oriented to person, place, and time.  ?Psychiatric:     ?   Mood and Affect:  Mood normal.     ?   Behavior: Behavior normal.     ?   Thought Content: Thought content normal.  ? ? ?Assessment & Plan: ? ?1: Chronic heart failure with reduced ejection fraction- ?- NYHA class III ?- euvolemic today ?- scales provided and he was instructed to weigh every morning after using the bathroom, write the weight down and call for an overnight weight gain of > 2 pounds or a weekly weight gain of > 5 pounds; blank weight charts provided ?- not adding salt and has been trying to read food labels for sodium content ?- reviewed the importance of keeping daily fluid intake to 60-64 ounces/ daily; feels like he's currently drinking up to a gallon of fluids daily ?- needs [facility] f/u & this was scheduled for 08/29/21 ?- on GDMT of farxiga, entresto and spironolactone ?- was bradycardic during recent admission; HR 70 today ?- letter given to excuse him from work until he's re-evaluated by cardiology in a couple of weeks ?- BNP 08/04/21 was 559.8 ? ?2: HTN with CKD- ?- BP looks good (113/82) ?- sees PCP Clemmie Krill) but asks for a new PCP closer to  [facility]; this was [facility] for [facility] for 11/05/21 ?- BMP 08/10/21 showed sodium 138, potassium 4.2, creatinine 1.29 & GFR >60 ? ?3: Crohn's disease- ?- stable at this time ? ?4: Tobacco use-

## 2021-08-11 ENCOUNTER — Other Ambulatory Visit: Payer: Self-pay

## 2021-08-11 ENCOUNTER — Encounter: Payer: Self-pay | Admitting: Family

## 2021-08-11 ENCOUNTER — Ambulatory Visit: Payer: Managed Care, Other (non HMO) | Attending: Family | Admitting: Family

## 2021-08-11 VITALS — BP 113/82 | HR 70 | Resp 20 | Ht 70.0 in | Wt 212.2 lb

## 2021-08-11 DIAGNOSIS — I10 Essential (primary) hypertension: Secondary | ICD-10-CM

## 2021-08-11 DIAGNOSIS — Z720 Tobacco use: Secondary | ICD-10-CM | POA: Diagnosis not present

## 2021-08-11 DIAGNOSIS — K50918 Crohn's disease, unspecified, with other complication: Secondary | ICD-10-CM | POA: Diagnosis not present

## 2021-08-11 DIAGNOSIS — F17210 Nicotine dependence, cigarettes, uncomplicated: Secondary | ICD-10-CM | POA: Insufficient documentation

## 2021-08-11 DIAGNOSIS — N189 Chronic kidney disease, unspecified: Secondary | ICD-10-CM | POA: Insufficient documentation

## 2021-08-11 DIAGNOSIS — I130 Hypertensive heart and chronic kidney disease with heart failure and stage 1 through stage 4 chronic kidney disease, or unspecified chronic kidney disease: Secondary | ICD-10-CM | POA: Insufficient documentation

## 2021-08-11 DIAGNOSIS — I5022 Chronic systolic (congestive) heart failure: Secondary | ICD-10-CM

## 2021-08-11 DIAGNOSIS — K5090 Crohn's disease, unspecified, without complications: Secondary | ICD-10-CM | POA: Insufficient documentation

## 2021-08-11 NOTE — Patient Instructions (Addendum)
Begin weighing daily and call for an overnight weight gain of 3 pounds or more or a weekly weight gain of more than 5 pounds. ? ? ?If you have voicemail, please make sure your mailbox is cleaned out so that we may leave a message and please make sure to listen to any voicemails.  ? ? ?Drink between 60-64 ounces of fluid daily ? ? ?Bring ALL medications including any over the counter or vitamins to every visit.  ? ? ?

## 2021-08-28 NOTE — Progress Notes (Signed)
? ?Cardiology Office Note   ? ?Date:  08/29/2021  ? ?ID:  Clarence [name], DOB Oct 17, 1970, MRN [id number] ? ?PCP:  Lynnell Jude, MD  ?Cardiologist:  Nelva Bush, MD  ?Electrophysiologist:  None  ? ?Chief Complaint: Hospital follow-up ? ?History of Present Illness:  ? ?Clarence [name] is a 51 y.o. 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 ? ?He was admitted to the hospital in 07/2021 with progressive dyspnea over the preceding several months and was found to have a new cardiomyopathy and atypical pneumonia.  High-sensitivity troponin 89.  BNP 559.  Echo during the admission showed an EF of 30 to 35%, global hypokinesis, moderate LVH, grade 2 diastolic dysfunction, normal RV systolic function and ventricular cavity size, mildly dilated left atrium, mild to moderate mitral regurgitation, and an estimated right atrial pressure of 15 mmHg.  He was diuresed and GDMT was initiated with symptomatic improvement.  Subsequent R/LHC showed diffuse moderate to severe multivessel CAD including CTO of the mid RCA, diffuse plaquing of LAD with up to 60% stenosis in the mid vessel, 60 to 70% proximal/mid LCx stenosis, and 90% stenosis of OM1.  Moderately elevated left heart filling pressure, severely elevated right heart filling pressure, moderate to severe pulmonary hypertension, moderately reduced cardiac output/index as outlined below.  It was suspected his cardiomyopathy was a combination of ischemic and nonischemic confounded by her significant pulmonary hypertension and right heart failure that may have been due to underlying pulmonary disease.  Continue diuresis and optimization of GDMT was recommended. ? ?He comes in today continuing to note fatigue along with 1 episode of near  syncope.  He has been without symptoms of angina or decompensation.  He has noted significant fluctuations in his BP at home, though does question the validity of his current BP cuff.  He has been taking his medications at varying hours, sometimes in the morning and not again until the evening of the following day.  No significant orthopnea or lower extremity swelling.  He is watching his salt and fluid intake.  No frank syncope.  He has been told he snores. ? ? ? ?Labs independently reviewed: ?07/2021 - potassium 4.2, BUN 38, serum creatinine 1.29, Hgb 16.8, PLT 219, magnesium 2.3, TC 207, TG 196, HDL 34, LDL 134, A1c 5.4 ?12/2018 - albumin 4.3, AST/ALT normal ? ?Past Medical History:  ?Diagnosis Date  ? CHF (congestive heart failure) ([place])   ? Chronic kidney disease   ? Crohn's disease ([place])   ? Hypertension   ? Proteinuria   ? ? ?Past Surgical History:  ?Procedure Laterality Date  ? COLECTOMY    ? RIGHT/LEFT HEART CATH AND CORONARY ANGIOGRAPHY N/A 08/07/2021  ? Procedure: RIGHT/LEFT HEART CATH AND CORONARY ANGIOGRAPHY;  Surgeon: Nelva Bush, MD;  Location: [facility];  Service: Cardiovascular;  Laterality: N/A;  ? ? ?Current Medications: ?Current Meds  ?Medication Sig  ? aspirin EC 81 MG EC tablet Take 1 tablet (81 mg total) by mouth daily. Swallow whole.  ? atorvastatin (LIPITOR) 80 MG tablet Take 1 tablet (80 mg total) by mouth every evening.  ? dapagliflozin propanediol (FARXIGA) 10 MG TABS tablet Take 1 tablet (10 mg total) by mouth daily.  ? furosemide (LASIX) 40 MG tablet Take 1 tablet (40 mg total) by mouth daily.  ?  sacubitril-valsartan (ENTRESTO) 24-26 MG Take 1 tablet by mouth 2 (two) times daily.  ? spironolactone (ALDACTONE) 25 MG tablet Take 0.5 tablets (12.5 mg total) by mouth daily.  ? [DISCONTINUED] sacubitril-valsartan (ENTRESTO) 49-51 MG Take 1 tablet by mouth 2 (two) times daily.  ? [DISCONTINUED] spironolactone (ALDACTONE) 25 MG tablet Take 1 tablet (25 mg total) by mouth daily.   ? ? ?Allergies:   Ibuprofen, Neomycin-bacitracin zn-polymyx, and Sulfacetamide  ? ?Social History  ? ?Socioeconomic History  ? Marital status: Single  ?  Spouse name: Not on file  ? Number of children: Not on file  ? Years of education: Not on file  ? Highest education level: Not on file  ?Occupational History  ? Not on file  ?Tobacco Use  ? Smoking status: Some Days  ?  Types: Cigars  ? Smokeless tobacco: Not on file  ?Substance and Sexual Activity  ? Alcohol use: Not Currently  ?  Alcohol/week: 1.0 standard drink  ?  Types: 1 Standard drinks or equivalent per week  ? Drug use: Never  ? Sexual activity: Not on file  ?Other Topics Concern  ? Not on file  ?Social History Narrative  ? Not on file  ? ?Social Determinants of Health  ? ?Financial Resource Strain: Not on file  ?Food Insecurity: Not on file  ?Transportation Needs: Not on file  ?Physical Activity: Not on file  ?Stress: Not on file  ?Social Connections: Not on file  ?  ? ?Family History:  ?The patient's family history includes Cancer in his maternal aunt; Heart disease in his maternal uncle; Pancreatic cancer in his maternal uncle. ? ?ROS:   ?12 point review of system is negative unless otherwise noted in the HPI. ? ? ?EKGs/Labs/Other Studies Reviewed:   ? ?Studies reviewed were summarized above. The additional studies were reviewed today: ? ?R/LHC 08/07/2021: ?Conclusions: ?Diffuse moderate-severe, multivessel coronary artery disease, including chronic total occlusion of mid RCA, diffuse plaquing of LAD with up to 60% stenosis in the mid vessel, 60-70% proximal/mid LCx stenosis, and 90% lesion of OM1. ?Moderately elevated left heart filling pressures (LVEDP/PCWP 25 mmHg). ?Severely elevated right heart filling pressures (mean RA 16 mmHg, RVEDP 30 mmHg). ?Moderate-severe pulmonary hypertension (mean PAP 42 mmHg, PVR 4.0 WU). ?Moderately reduced Fick cardiac output/index (CO 4.2 L/min, CI 2.0 L/min/m^2).. ?  ?Recommendations: ?Suspect cardiomyopathy is a  combination of ischemic and nonischemic etiologies confounded by significant pulmonary hypertension and right heart failure that may be due to underlying lung disease.  Recommend continued diuresis and optimization of GDMT for acute HFrEF.  In the setting of ongoing wheezing with concern for obstructive lung disease, I will hold carvedilol for now and add Entresto. ?Secondary prevention of coronary artery disease, including high-intensity statin therapy.  Diffuse nature of CAD is not ideal for percutaneous or surgical revascularization.  Mild troponin elevation on admission is most likely due to demand ischemia in the setting of acute HFrEF and chronic ischemic heart disease. ?__________ ? ?2D echo 08/05/2021: ? 1. Left ventricular ejection fraction, by estimation, is 30 to 35%. The  ?left ventricle has moderately decreased function. The left ventricle  ?demonstrates global hypokinesis. There is moderate left ventricular  ?hypertrophy. Left ventricular diastolic  ?parameters are consistent with Grade II diastolic dysfunction  ?(pseudonormalization).  ? 2. Right ventricular systolic function is normal. The right ventricular  ?size is normal. Tricuspid regurgitation signal is inadequate for assessing  ?PA pressure.  ? 3. Left atrial size was mildly dilated.  ? 4. The  mitral valve is abnormal. Mild to moderate mitral valve  ?regurgitation. No evidence of mitral stenosis.  ? 5. The aortic valve has an indeterminant number of cusps. Aortic valve  ?regurgitation is not visualized. No aortic stenosis is present.  ? 6. The inferior vena cava is dilated in size with <50% respiratory  ?variability, suggesting right atrial pressure of 15 mmHg. ? ? ?EKG:  EKG is ordered today.  The EKG ordered today demonstrates sinus bradycardia, 56 bpm, LVH with early repolarization abnormalities, prior inferior infarct ? ?Recent Labs: ?08/04/2021: B Natriuretic Peptide 559.8; TSH 0.258 ?08/06/2021: Magnesium 2.3 ?08/08/2021: Hemoglobin 16.8;  Platelets 219 ?08/29/2021: ALT 51; BUN 36; Creatinine, Ser 1.95; Potassium 6.0; Sodium 136  ?Recent Lipid Panel ?   ?Component Value Date/Time  ? CHOL 207 (H) 08/05/2021 0509  ? TRIG 196 (H) 08/05/2021 0509  ? HDL 34

## 2021-08-29 ENCOUNTER — Encounter: Payer: Self-pay | Admitting: Intensive Care

## 2021-08-29 ENCOUNTER — Telehealth: Payer: Self-pay | Admitting: Physician Assistant

## 2021-08-29 ENCOUNTER — Emergency Department
Admission: EM | Admit: 2021-08-29 | Discharge: 2021-08-29 | Disposition: A | Discharge status: Home / Self Care | Payer: Managed Care, Other (non HMO) | Attending: Emergency Medicine | Admitting: Emergency Medicine

## 2021-08-29 ENCOUNTER — Ambulatory Visit: Payer: Managed Care, Other (non HMO) | Admitting: Physician Assistant

## 2021-08-29 ENCOUNTER — Encounter: Payer: Self-pay | Admitting: Physician Assistant

## 2021-08-29 ENCOUNTER — Other Ambulatory Visit: Payer: Self-pay

## 2021-08-29 ENCOUNTER — Emergency Department: Payer: Managed Care, Other (non HMO)

## 2021-08-29 ENCOUNTER — Other Ambulatory Visit
Admission: RE | Admit: 2021-08-29 | Discharge: 2021-08-29 | Disposition: A | Discharge status: Home / Self Care | Payer: Managed Care, Other (non HMO) | Source: Home / Self Care | Attending: Physician Assistant | Admitting: Physician Assistant

## 2021-08-29 ENCOUNTER — Telehealth: Payer: Self-pay | Admitting: *Deleted

## 2021-08-29 VITALS — BP 98/76 | HR 56 | Ht 70.0 in | Wt 207.0 lb

## 2021-08-29 DIAGNOSIS — R0600 Dyspnea, unspecified: Secondary | ICD-10-CM | POA: Diagnosis not present

## 2021-08-29 DIAGNOSIS — I2510 Atherosclerotic heart disease of native coronary artery without angina pectoris: Secondary | ICD-10-CM

## 2021-08-29 DIAGNOSIS — R001 Bradycardia, unspecified: Secondary | ICD-10-CM

## 2021-08-29 DIAGNOSIS — N182 Chronic kidney disease, stage 2 (mild): Secondary | ICD-10-CM

## 2021-08-29 DIAGNOSIS — Z87891 Personal history of nicotine dependence: Secondary | ICD-10-CM

## 2021-08-29 DIAGNOSIS — I10 Essential (primary) hypertension: Secondary | ICD-10-CM

## 2021-08-29 DIAGNOSIS — I130 Hypertensive heart and chronic kidney disease with heart failure and stage 1 through stage 4 chronic kidney disease, or unspecified chronic kidney disease: Secondary | ICD-10-CM | POA: Insufficient documentation

## 2021-08-29 DIAGNOSIS — E785 Hyperlipidemia, unspecified: Secondary | ICD-10-CM

## 2021-08-29 DIAGNOSIS — N179 Acute kidney failure, unspecified: Secondary | ICD-10-CM | POA: Diagnosis not present

## 2021-08-29 DIAGNOSIS — I5020 Unspecified systolic (congestive) heart failure: Secondary | ICD-10-CM

## 2021-08-29 DIAGNOSIS — G4730 Sleep apnea, unspecified: Secondary | ICD-10-CM

## 2021-08-29 DIAGNOSIS — R911 Solitary pulmonary nodule: Secondary | ICD-10-CM | POA: Insufficient documentation

## 2021-08-29 DIAGNOSIS — E875 Hyperkalemia: Secondary | ICD-10-CM

## 2021-08-29 DIAGNOSIS — Z0279 Encounter for issue of other medical certificate: Secondary | ICD-10-CM

## 2021-08-29 LAB — COMPREHENSIVE METABOLIC PANEL
ALT: 51 U/L — ABNORMAL HIGH (ref 0–44)
ALT: 50 U/L — ABNORMAL HIGH (ref 0–44)
AST: 30 U/L (ref 15–41)
AST: 30 U/L (ref 15–41)
Albumin: 3.7 g/dL (ref 3.5–5.0)
Albumin: 3.7 g/dL (ref 3.5–5.0)
Alkaline Phosphatase: 116 U/L (ref 38–126)
Alkaline Phosphatase: 115 U/L (ref 38–126)
Anion gap: 7 (ref 5–15)
Anion gap: 8 (ref 5–15)
BUN: 36 mg/dL — ABNORMAL HIGH (ref 6–20)
BUN: 37 mg/dL — ABNORMAL HIGH (ref 6–20)
CO2: 22 mmol/L (ref 22–32)
CO2: 23 mmol/L (ref 22–32)
Calcium: 9.9 mg/dL (ref 8.9–10.3)
Calcium: 9.8 mg/dL (ref 8.9–10.3)
Chloride: 107 mmol/L (ref 98–111)
Chloride: 107 mmol/L (ref 98–111)
Creatinine, Ser: 1.95 mg/dL — ABNORMAL HIGH (ref 0.61–1.24)
Creatinine, Ser: 1.95 mg/dL — ABNORMAL HIGH (ref 0.61–1.24)
GFR, Estimated: 41 mL/min — ABNORMAL LOW (ref >60)
GFR, Estimated: 41 mL/min — ABNORMAL LOW (ref >60)
Glucose, Bld: 111 mg/dL — ABNORMAL HIGH (ref 70–99)
Glucose, Bld: 97 mg/dL (ref 70–99)
Potassium: 6 mmol/L — ABNORMAL HIGH (ref 3.5–5.1)
Potassium: 6.1 mmol/L — ABNORMAL HIGH (ref 3.5–5.1)
Sodium: 136 mmol/L (ref 135–145)
Sodium: 138 mmol/L (ref 135–145)
Total Bilirubin: 0.8 mg/dL (ref 0.3–1.2)
Total Bilirubin: 0.8 mg/dL (ref 0.3–1.2)
Total Protein: 8 g/dL (ref 6.5–8.1)
Total Protein: 7.9 g/dL (ref 6.5–8.1)

## 2021-08-29 LAB — URINALYSIS, COMPLETE (UACMP) WITH MICROSCOPIC
Bacteria, UA: NONE SEEN
Bilirubin Urine: NEGATIVE
Glucose, UA: 50 mg/dL — AB
Hgb urine dipstick: NEGATIVE
Ketones, ur: NEGATIVE mg/dL
Nitrite: NEGATIVE
Protein, ur: 30 mg/dL — AB
Specific Gravity, Urine: 1.017 (ref 1.005–1.030)
pH: 5 (ref 5.0–8.0)

## 2021-08-29 LAB — CBC WITH DIFFERENTIAL/PLATELET
Abs Immature Granulocytes: 0.01 10*3/uL (ref 0.00–0.07)
Basophils Absolute: 0 10*3/uL (ref 0.0–0.1)
Basophils Relative: 0 %
Eosinophils Absolute: 0.2 10*3/uL (ref 0.0–0.5)
Eosinophils Relative: 3 %
HCT: 47.3 % (ref 39.0–52.0)
Hemoglobin: 15.6 g/dL (ref 13.0–17.0)
Immature Granulocytes: 0 %
Lymphocytes Relative: 18 %
Lymphs Abs: 1.4 10*3/uL (ref 0.7–4.0)
MCH: 28 pg (ref 26.0–34.0)
MCHC: 33 g/dL (ref 30.0–36.0)
MCV: 84.8 fL (ref 80.0–100.0)
Monocytes Absolute: 0.8 10*3/uL (ref 0.1–1.0)
Monocytes Relative: 10 %
Neutro Abs: 5.4 10*3/uL (ref 1.7–7.7)
Neutrophils Relative %: 69 %
Platelets: 241 10*3/uL (ref 150–400)
RBC: 5.58 MIL/uL (ref 4.22–5.81)
RDW: 13.4 % (ref 11.5–15.5)
WBC: 7.8 10*3/uL (ref 4.0–10.5)
nRBC: 0 % (ref 0.0–0.2)

## 2021-08-29 LAB — BRAIN NATRIURETIC PEPTIDE: B Natriuretic Peptide: 122.6 pg/mL — ABNORMAL HIGH (ref 0.0–100.0)

## 2021-08-29 MED ORDER — SPIRONOLACTONE 25 MG PO TABS: 12.5000 mg | ORAL_TABLET | Freq: Every day | ORAL | 3 refills | Status: DC

## 2021-08-29 MED ORDER — ENTRESTO 24-26 MG PO TABS: 1.0000 | ORAL_TABLET | Freq: Two times a day (BID) | ORAL | 11 refills | Status: DC

## 2021-08-29 MED ORDER — SODIUM ZIRCONIUM CYCLOSILICATE 10 G PO PACK
10.0000 g | PACK | ORAL | Status: DC
  Filled 2021-08-29: qty 1

## 2021-08-29 MED ORDER — LACTATED RINGERS IV BOLUS
500.0000 mL | Freq: Once | INTRAVENOUS | Status: AC
  Administered 2021-08-29: 500 mL via INTRAVENOUS

## 2021-08-29 NOTE — Telephone Encounter (Signed)
Received STD forms from the [place], [place] payment received for $29, scanned forms into chart, placed int Christell Faith, PA nurses box, Jule Ser, RN.

## 2021-08-29 NOTE — Telephone Encounter (Signed)
Forms placed on providers desk.  ?

## 2021-08-29 NOTE — Patient Instructions (Addendum)
Medication Instructions:  ?Your physician has recommended you make the following change in your medication:  ? ?DECREASE Entresto to 24-26 mg twice a day ?DECREASE Spironolactone 12.5 mg once a day ?TRY over the counter Pepcid 40 mg at bedtime ? ?*If you need a refill on your cardiac medications before your next appointment, please call your pharmacy* ? ? ?Lab Work: ?CMET today. Please go over to medical mall entrance of the hospital and check in at registration.  ? ?If you have labs (blood work) drawn today and your tests are completely normal, you will receive your results only by: ?MyChart Message (if you have MyChart) OR ?A paper copy in the mail ?If you have any lab test that is abnormal or we need to change your treatment, we will call you to review the results. ? ? ?Testing/Procedures: ? ?Scheduled for November 03, 2021 at 09:00 am over at the [facility] entrance. Check in at registration.  ? ? ?Non-Cardiac CT scanning, (CAT scanning), is a noninvasive, special x-ray that produces cross-sectional images of the body using x-rays and a computer. CT scans help physicians diagnose and treat medical conditions. For some CT exams, a contrast material is used to enhance visibility in the area of the body being studied. CT scans provide greater clarity and reveal more details than regular x-ray exams. ? ? ? ?Follow-Up: ? ?At [facility], you and your health needs are our priority.  As part of our continuing mission to provide you with exceptional heart care, we have created designated Provider Care Teams.  These Care Teams include your primary Cardiologist (physician) and Advanced Practice Providers (APPs -  Physician Assistants and Nurse Practitioners) who all work together to provide you with the care you need, when you need it. ? ? ?Your next appointment:   ?1 month(s) ? ?The format for your next appointment:   ?In Person ? ?Provider:   ?Nelva Bush, MD or Christell Faith, PA-C  ? ? ?Other Instructions ?Pulmonary  referral placed to be seen for sleep study. Their number is [phone].  ? ?Important Information About Sugar ? ? ? ? ?  ?

## 2021-08-29 NOTE — Discharge Instructions (Addendum)
As we discussed please stop taking your spironolactone also known as Aldactone as well as your Entresto until you are instructed to resume her receive alternative medicine by cardiology.  There is also vital that you get your potassium rechecked on 4/17. ?

## 2021-08-29 NOTE — ED Provider Notes (Signed)
? ?[facility] ?Provider Note ? ? ? Event Date/Time  ? First MD Initiated Contact with Patient 08/29/21 1456   ?  (approximate) ? ? ?History  ? ?Abnormal Lab ? ? ?HPI ? ?Clarence [name] is a 51 y.o. 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  Patient states other than fatigue he has some dyspnea with exertion but that has been present since he was discharged.  He still has a little bit of phlegm and mild cough but he feels its been getting much better.  No new chest pain, fevers, abdominal pain or diarrhea.  States he has had bad reflux and feels acid coming up into his throat sometimes this is benign on the last couple days as well.  No other new symptoms he is aware of.  He has been taking all his medications.  He notes he was started on a new medication today for his cholesterol and Entresto for his heart.  Patient states he has been urinating and does not think he has been peeing less but is not sure. ? ?  ? ? ?Physical Exam  ?Triage Vital Signs: ?ED Triage Vitals [08/29/21 1433]  ?Enc Vitals Group  ?   BP (!) 112/95  ?   Pulse Rate 83  ?   Resp 18  ?   Temp 98.4 ?F (36.9 ?C)  ?   Temp Source Oral  ?   SpO2 99 %  ?   Weight 235 lb 14.3 oz (107 kg)  ?   Height 5' 10"  (1.778 m)  ?   Head Circumference   ?   Peak Flow   ?   Pain Score 0  ?   Pain Loc   ?   Pain Edu?   ?   Excl. in [place]?   ? ? ?Most recent vital signs: ?Vitals:  ? 08/29/21 1433 08/29/21 1804  ?BP: (!) 112/95 115/60  ?Pulse: 83 85  ?Resp: 18 17  ?Temp: 98.4 ?F (36.9 ?C) 98.5 ?F (36.9 ?C)  ?SpO2: 99% 98%  ? ? ?General: Awake,  no distress.  ?CV:  Good peripheral perfusion.  2+ radial pulses. ?Resp:  Normal effort.  Clear bilaterally. ?Abd:  No distention.  Soft. ?Other:  No significant lower extremity edema. ? ? ?ED Results / Procedures / Treatments  ?Labs ?(all labs ordered are listed, but only abnormal results are displayed) ?Labs Reviewed  ?COMPREHENSIVE METABOLIC PANEL - Abnormal; Notable for the following components:  ?    Result Value  ? Potassium 6.1 (*)   ? BUN 37 (*)   ? Creatinine, Ser 1.95 (*)   ? ALT 50 (*)   ? GFR, Estimated 41 (*)   ? All other components within normal limits  ?URINALYSIS, COMPLETE (UACMP) WITH MICROSCOPIC - Abnormal; Notable for the following components:  ? Color, Urine YELLOW (*)   ? APPearance HAZY (*)   ? Glucose, UA 50 (*)   ? Protein, ur 30 (*)   ? Leukocytes,Ua MODERATE (*)   ? All other components within normal limits  ?BRAIN NATRIURETIC PEPTIDE - Abnormal; Notable for the following components:  ? B  Natriuretic Peptide 122.6 (*)   ? All other components within normal limits  ?URINE CULTURE  ?CBC WITH DIFFERENTIAL/PLATELET  ? ? ? ?EKG ? ?ECG shows sinus rhythm with incomplete left bundle branch block and T wave changes in inferior leads II, 3, aVF and lateral leads V5 and V6 all nearly identical from ECG obtained 3/21.  Patient to incomplete left bundle branch at that time.  Intervals today are unremarkable. ? ?RADIOLOGY ? ?Renal ultrasound reviewed myself without evidence of hydronephrosis or other acute abnormality.  Also viewed radiology's interpretation. ? ?PROCEDURES: ? ?Critical Care performed: No ? ?.1-3 Lead EKG Interpretation ?Performed by: Lucrezia Starch, MD ?Authorized by: Lucrezia Starch, MD  ? ?  Interpretation: normal   ?  ECG rate assessment: normal   ?  Rhythm: sinus rhythm   ?  Ectopy: none   ?  Conduction: normal   ? ?The patient is on the cardiac monitor to evaluate for evidence of arrhythmia and/or significant heart rate changes. ? ? ?MEDICATIONS ORDERED IN ED: ?Medications   ?sodium zirconium cyclosilicate (LOKELMA) packet 10 g (has no administration in time range)  ?lactated ringers bolus 500 mL (500 mLs Intravenous New Bag/Given 08/29/21 1654)  ? ? ? ?IMPRESSION / MDM / ASSESSMENT AND PLAN / ED COURSE  ?I reviewed the triage vital signs and the nursing notes. ?             ?               ? ?I reviewed labs earlier today which were repeated on arrival to emergency room which showed a BUN of 37 and a creatinine of 1.95 compared to 1.292 weeks ago representing AKI.  ALT is 50 and a K of 6.1 without any other significant electrolyte or metabolic derangements.  No evidence of acidosis.  CBC today shows no leukocytosis or acute anemia.  BNP is 122 compared to 559 3 weeks ago and overall patient does not appear acutely volume overloaded.  UA has 50 glucose, 30 protein and moderate leukocyte esterase as well as 1-20 WBCs but otherwise is unremarkable. ? ?Differential diagnosis includes, but is not limited to hyperkalemia and AKI related to spironolactone and Entresto as well as possible overdiuresis as he is on Lasix as well, acute kidney injury versus an obstructive uropathy.  No recent NSAIDs or other acute GI bleeding episodes or other obvious volume losses.  He appears euvolemic on my exam. ? ?ECG shows sinus rhythm with incomplete left bundle branch block and T wave changes in inferior leads II, 3, aVF and lateral leads V5 and V6 all nearly identical from ECG obtained 3/21.  Patient to incomplete left bundle branch at that time.  Intervals today are unremarkable. ? ?Renal ultrasound reviewed myself without evidence of hydronephrosis or other acute abnormality.  Also viewed radiology's interpretation.  Bladder scan shows less than 150 cc of urine and given otherwise reassuring renal ultrasound absence of a history of BPH and no evidence of retention bladder scan a very low suspicion for obstructive uropathy. ? ?I reviewed patient's presentation work-up with on-call cardiologist Dr.  Rockey Situ who voiced concern with patient spironolactone and Entresto could be primary causative factor.  I agree with this.  Patient appears euvolemic and I think able to tolerate a little bit of IV fluids.  500 cc of LR and some Lokelma given.  Given he has no EKG changes and there is clear source for this AKI and mild hyperkalemia I do not believe  requires admission and Dr. Rockey Situ agrees.  We will have his potassium rechecked on Monday and hold these medications until then.  He has no other acute concerns at this time.  Discharged in stable condition.  Strict return precautions advised and discussed. ? ?  ? ? ?FINAL CLINICAL IMPRESSION(S) / ED DIAGNOSES  ? ?Final diagnoses:  ?AKI (acute kidney injury) ([place])  ?Hyperkalemia  ? ? ? ?Rx / DC Orders  ? ?ED Discharge Orders   ? ? None  ? ?  ? ? ? ?Note:  This document was prepared using Dragon voice recognition software and may include unintentional dictation errors. ?  ?Lucrezia Starch, MD ?08/29/21 1812 ? ?

## 2021-08-29 NOTE — Telephone Encounter (Signed)
Attempted to call patient back. Left voicemail message to call back and ask for [facility].  ?

## 2021-08-29 NOTE — ED Notes (Signed)
Follow up cardiology stop listed meds on dc instructions , pt states understanding  ?

## 2021-08-29 NOTE — Telephone Encounter (Signed)
Reviewed results and recommendations with patient. Instructed him to proceed to the ED as his labs were abnormal. He verbalized understanding and will head that way now.  ? ?[place] nurse in ED to make her aware patient is heading that way.  ?

## 2021-08-29 NOTE — Telephone Encounter (Signed)
-----   Message from Clarence Mu, PA-C sent at 08/29/2021 11:04 AM EDT ----- ?Please call patient.  He has a significantly elevated potassium level along with worsening renal function.  Patient needs to proceed to the ED. ?

## 2021-08-29 NOTE — Telephone Encounter (Signed)
Called patients mother listed in chart to request she call and have him answer the phone. She will call him now and I will try to call him back.  ?

## 2021-08-29 NOTE — ED Triage Notes (Signed)
Patient sent to ER after bloodwork this AM showed elevated Potassium, BUN, and creatinine. Patient c/o fatigue and dizziness. Denies pain ?

## 2021-08-29 NOTE — Telephone Encounter (Signed)
Left voicemail message to call back for review of results.  

## 2021-08-31 LAB — URINE CULTURE: Culture: <10000 — AB

## 2021-09-02 ENCOUNTER — Telehealth: Payer: Self-pay | Admitting: Emergency Medicine

## 2021-09-02 ENCOUNTER — Other Ambulatory Visit
Admission: RE | Admit: 2021-09-02 | Discharge: 2021-09-02 | Disposition: A | Discharge status: Home / Self Care | Payer: Managed Care, Other (non HMO) | Attending: Physician Assistant | Admitting: Physician Assistant

## 2021-09-02 DIAGNOSIS — N182 Chronic kidney disease, stage 2 (mild): Secondary | ICD-10-CM | POA: Insufficient documentation

## 2021-09-02 LAB — BASIC METABOLIC PANEL
Anion gap: 8 (ref 5–15)
BUN: 38 mg/dL — ABNORMAL HIGH (ref 6–20)
CO2: 20 mmol/L — ABNORMAL LOW (ref 22–32)
Calcium: 9.8 mg/dL (ref 8.9–10.3)
Chloride: 109 mmol/L (ref 98–111)
Creatinine, Ser: 1.73 mg/dL — ABNORMAL HIGH (ref 0.61–1.24)
GFR, Estimated: 47 mL/min — ABNORMAL LOW (ref >60)
Glucose, Bld: 139 mg/dL — ABNORMAL HIGH (ref 70–99)
Potassium: 5.4 mmol/L — ABNORMAL HIGH (ref 3.5–5.1)
Sodium: 137 mmol/L (ref 135–145)

## 2021-09-02 NOTE — Telephone Encounter (Signed)
-----   Message from Minna Merritts, MD sent at 08/29/2021  6:04 PM EDT ----- ?Judson Roch can we call him on April 17 Monday morning to arrange a BMP either in the office or hospital lobby ?On Friday he had elevated potassium, and creatinine ?Seen in the emergency room by ER docs ?Was given some IV fluids, spironolactone held, Entresto held for now until repeat potassium improves ?We can probably restart the Entresto but hold the spironolactone once potassium comes down ?Thx ?TG ? ?

## 2021-09-02 NOTE — Telephone Encounter (Signed)
Called and spoke with patient. Explained that we need to get repeat labs today. Pt would like to go to [facility]. Order placed.  ?

## 2021-09-03 ENCOUNTER — Telehealth: Payer: Self-pay | Admitting: *Deleted

## 2021-09-03 DIAGNOSIS — N182 Chronic kidney disease, stage 2 (mild): Secondary | ICD-10-CM

## 2021-09-03 DIAGNOSIS — I5020 Unspecified systolic (congestive) heart failure: Secondary | ICD-10-CM

## 2021-09-03 NOTE — Telephone Encounter (Signed)
Forms completed and faxed to the [employer] and patient contacted and notified forms were ready for pickup

## 2021-09-03 NOTE — Telephone Encounter (Signed)
-----   Message from Rise Mu, PA-C sent at 09/03/2021  7:21 AM EDT ----- ?Potassium remains mildly elevated, though is improved from prior ?Kidney function is improved from prior, though remains elevated and above his baseline ?Random glucose is elevated ? ?Recommendations: ?-Please ensure he is not taking spironolactone ?-Please refer the patient to nephrology for CKD  ?-Please ensure he is not adding salt seasonings or substitutes that are high in potassium ?-Primary cardiologist had recommended he resume Entresto following his ED visit, with ongoing AKI and hyperkalemia I recommend he hold Entresto, Farxiga, and furosemide (continue to hold spironolactone as outlined above as well) ?-Recheck BMP in 48 hours ?

## 2021-09-03 NOTE — Telephone Encounter (Signed)
Reviewed results and recommendations with patient. He reports seeing a kidney specialist in the past and was able to look up their information. Instructed to hold medications listed and have repeat labs on Friday at the [facility]. He verbalized understanding and was agreeable with plan.  ? ?Called over to [employer] on [address] in [place] at [phone]. They had me leave a message for return call. Will reach out to them again tomorrow.  ? ?Called patient back and reviewed that I had to leave a message. Advised that I would call again tomorrow to try and get him an appointment as soon as possible. He was appreciative for the information with no further questions at this time.  ?

## 2021-09-03 NOTE — Telephone Encounter (Signed)
Forms completed and given to Katherina Mires  ?

## 2021-09-04 NOTE — Telephone Encounter (Signed)
Spoke with [facility] and patient was seen by Dr. Joylene Grapes in the past. They requested we fax over records to [phone]. Sending those over so they can get him scheduled to be seen.  ? ?

## 2021-09-04 NOTE — Telephone Encounter (Signed)
Spoke with patient and reviewed that he had previously seen Dr. Joylene Grapes. I sent them his information and instructed patient to give them a call if he has not heard anything in a week. He verbalized understanding with no further questions at this time.  ?

## 2021-09-09 ENCOUNTER — Inpatient Hospital Stay
Admission: EM | Admit: 2021-09-09 | Discharge: 2021-09-11 | DRG: 291 | Disposition: A | Discharge status: Home / Self Care | Payer: Managed Care, Other (non HMO) | Attending: Internal Medicine | Admitting: Internal Medicine

## 2021-09-09 ENCOUNTER — Encounter: Payer: Self-pay | Admitting: Emergency Medicine

## 2021-09-09 ENCOUNTER — Encounter
Admission: EM | Disposition: A | Discharge status: Home / Self Care | Payer: Self-pay | Source: Home / Self Care | Attending: Internal Medicine

## 2021-09-09 ENCOUNTER — Emergency Department: Payer: Managed Care, Other (non HMO)

## 2021-09-09 DIAGNOSIS — Z888 Allergy status to other drugs, medicaments and biological substances status: Secondary | ICD-10-CM

## 2021-09-09 DIAGNOSIS — I248 Other forms of acute ischemic heart disease: Secondary | ICD-10-CM | POA: Diagnosis present

## 2021-09-09 DIAGNOSIS — I214 Non-ST elevation (NSTEMI) myocardial infarction: Secondary | ICD-10-CM

## 2021-09-09 DIAGNOSIS — Z79899 Other long term (current) drug therapy: Secondary | ICD-10-CM

## 2021-09-09 DIAGNOSIS — Z9049 Acquired absence of other specified parts of digestive tract: Secondary | ICD-10-CM | POA: Diagnosis not present

## 2021-09-09 DIAGNOSIS — I130 Hypertensive heart and chronic kidney disease with heart failure and stage 1 through stage 4 chronic kidney disease, or unspecified chronic kidney disease: Secondary | ICD-10-CM | POA: Diagnosis present

## 2021-09-09 DIAGNOSIS — I25110 Atherosclerotic heart disease of native coronary artery with unstable angina pectoris: Secondary | ICD-10-CM | POA: Diagnosis present

## 2021-09-09 DIAGNOSIS — I5043 Acute on chronic combined systolic (congestive) and diastolic (congestive) heart failure: Secondary | ICD-10-CM

## 2021-09-09 DIAGNOSIS — E669 Obesity, unspecified: Secondary | ICD-10-CM | POA: Diagnosis present

## 2021-09-09 DIAGNOSIS — I420 Dilated cardiomyopathy: Secondary | ICD-10-CM | POA: Diagnosis not present

## 2021-09-09 DIAGNOSIS — I428 Other cardiomyopathies: Secondary | ICD-10-CM | POA: Diagnosis present

## 2021-09-09 DIAGNOSIS — Z7982 Long term (current) use of aspirin: Secondary | ICD-10-CM

## 2021-09-09 DIAGNOSIS — I272 Pulmonary hypertension, unspecified: Secondary | ICD-10-CM | POA: Diagnosis present

## 2021-09-09 DIAGNOSIS — I340 Nonrheumatic mitral (valve) insufficiency: Secondary | ICD-10-CM | POA: Diagnosis present

## 2021-09-09 DIAGNOSIS — I255 Ischemic cardiomyopathy: Secondary | ICD-10-CM | POA: Diagnosis present

## 2021-09-09 DIAGNOSIS — K5090 Crohn's disease, unspecified, without complications: Secondary | ICD-10-CM | POA: Diagnosis present

## 2021-09-09 DIAGNOSIS — I25118 Atherosclerotic heart disease of native coronary artery with other forms of angina pectoris: Secondary | ICD-10-CM | POA: Diagnosis not present

## 2021-09-09 DIAGNOSIS — I5023 Acute on chronic systolic (congestive) heart failure: Secondary | ICD-10-CM

## 2021-09-09 DIAGNOSIS — N1831 Chronic kidney disease, stage 3a: Secondary | ICD-10-CM | POA: Diagnosis present

## 2021-09-09 DIAGNOSIS — E785 Hyperlipidemia, unspecified: Secondary | ICD-10-CM | POA: Diagnosis not present

## 2021-09-09 DIAGNOSIS — I200 Unstable angina: Secondary | ICD-10-CM

## 2021-09-09 DIAGNOSIS — I493 Ventricular premature depolarization: Secondary | ICD-10-CM | POA: Diagnosis present

## 2021-09-09 DIAGNOSIS — I161 Hypertensive emergency: Secondary | ICD-10-CM | POA: Diagnosis not present

## 2021-09-09 DIAGNOSIS — Z8249 Family history of ischemic heart disease and other diseases of the circulatory system: Secondary | ICD-10-CM

## 2021-09-09 DIAGNOSIS — I2119 ST elevation (STEMI) myocardial infarction involving other coronary artery of inferior wall: Secondary | ICD-10-CM

## 2021-09-09 DIAGNOSIS — Z800 Family history of malignant neoplasm of digestive organs: Secondary | ICD-10-CM | POA: Diagnosis not present

## 2021-09-09 DIAGNOSIS — I160 Hypertensive urgency: Secondary | ICD-10-CM

## 2021-09-09 DIAGNOSIS — N182 Chronic kidney disease, stage 2 (mild): Secondary | ICD-10-CM | POA: Diagnosis present

## 2021-09-09 DIAGNOSIS — Z881 Allergy status to other antibiotic agents status: Secondary | ICD-10-CM | POA: Diagnosis not present

## 2021-09-09 DIAGNOSIS — Z87891 Personal history of nicotine dependence: Secondary | ICD-10-CM

## 2021-09-09 DIAGNOSIS — Z6830 Body mass index (BMI) 30.0-30.9, adult: Secondary | ICD-10-CM | POA: Diagnosis not present

## 2021-09-09 DIAGNOSIS — I5042 Chronic combined systolic (congestive) and diastolic (congestive) heart failure: Secondary | ICD-10-CM

## 2021-09-09 LAB — CBC WITH DIFFERENTIAL/PLATELET
Abs Immature Granulocytes: 0.06 10*3/uL (ref 0.00–0.07)
Basophils Absolute: 0 10*3/uL (ref 0.0–0.1)
Basophils Relative: 0 %
Eosinophils Absolute: 0.2 10*3/uL (ref 0.0–0.5)
Eosinophils Relative: 2 %
HCT: 46.1 % (ref 39.0–52.0)
Hemoglobin: 15.7 g/dL (ref 13.0–17.0)
Immature Granulocytes: 1 %
Lymphocytes Relative: 28 %
Lymphs Abs: 2.8 10*3/uL (ref 0.7–4.0)
MCH: 27.9 pg (ref 26.0–34.0)
MCHC: 34.1 g/dL (ref 30.0–36.0)
MCV: 82 fL (ref 80.0–100.0)
Monocytes Absolute: 1 10*3/uL (ref 0.1–1.0)
Monocytes Relative: 10 %
Neutro Abs: 6 10*3/uL (ref 1.7–7.7)
Neutrophils Relative %: 59 %
Platelets: 266 10*3/uL (ref 150–400)
RBC: 5.62 MIL/uL (ref 4.22–5.81)
RDW: 13 % (ref 11.5–15.5)
WBC: 10.1 10*3/uL (ref 4.0–10.5)
nRBC: 0 % (ref 0.0–0.2)

## 2021-09-09 LAB — LIPID PANEL
Cholesterol: 174 mg/dL (ref 0–200)
HDL: 35 mg/dL — ABNORMAL LOW (ref >40)
LDL Cholesterol: UNDETERMINED mg/dL (ref 0–99)
Total CHOL/HDL Ratio: 5 RATIO
Triglycerides: 520 mg/dL — ABNORMAL HIGH (ref <150)
VLDL: UNDETERMINED mg/dL (ref 0–40)

## 2021-09-09 LAB — URINE DRUG SCREEN, QUALITATIVE (ARMC ONLY)
Amphetamines, Ur Screen: NOT DETECTED
Barbiturates, Ur Screen: NOT DETECTED
Benzodiazepine, Ur Scrn: NOT DETECTED
Cannabinoid 50 Ng, Ur ~~LOC~~: NOT DETECTED
Cocaine Metabolite,Ur ~~LOC~~: NOT DETECTED
MDMA (Ecstasy)Ur Screen: NOT DETECTED
Methadone Scn, Ur: NOT DETECTED
Opiate, Ur Screen: POSITIVE — AB
Phencyclidine (PCP) Ur S: NOT DETECTED
Tricyclic, Ur Screen: NOT DETECTED

## 2021-09-09 LAB — APTT: aPTT: 64 seconds — ABNORMAL HIGH (ref 24–36)

## 2021-09-09 LAB — PROTIME-INR
INR: 1 (ref 0.8–1.2)
Prothrombin Time: 13.3 seconds (ref 11.4–15.2)

## 2021-09-09 LAB — TROPONIN I (HIGH SENSITIVITY)
Troponin I (High Sensitivity): 55 ng/L — ABNORMAL HIGH (ref <18)
Troponin I (High Sensitivity): 139 ng/L (ref <18)

## 2021-09-09 LAB — COMPREHENSIVE METABOLIC PANEL
ALT: 53 U/L — ABNORMAL HIGH (ref 0–44)
AST: 41 U/L (ref 15–41)
Albumin: 3.8 g/dL (ref 3.5–5.0)
Alkaline Phosphatase: 112 U/L (ref 38–126)
Anion gap: 6 (ref 5–15)
BUN: 22 mg/dL — ABNORMAL HIGH (ref 6–20)
CO2: 26 mmol/L (ref 22–32)
Calcium: 9.4 mg/dL (ref 8.9–10.3)
Chloride: 106 mmol/L (ref 98–111)
Creatinine, Ser: 1.31 mg/dL — ABNORMAL HIGH (ref 0.61–1.24)
GFR, Estimated: >60 mL/min (ref >60)
Glucose, Bld: 111 mg/dL — ABNORMAL HIGH (ref 70–99)
Potassium: 3.9 mmol/L (ref 3.5–5.1)
Sodium: 138 mmol/L (ref 135–145)
Total Bilirubin: 0.6 mg/dL (ref 0.3–1.2)
Total Protein: 7.6 g/dL (ref 6.5–8.1)

## 2021-09-09 LAB — BRAIN NATRIURETIC PEPTIDE: B Natriuretic Peptide: 478.7 pg/mL — ABNORMAL HIGH (ref 0.0–100.0)

## 2021-09-09 LAB — GLUCOSE, CAPILLARY: Glucose-Capillary: 109 mg/dL — ABNORMAL HIGH (ref 70–99)

## 2021-09-09 LAB — LDL CHOLESTEROL, DIRECT: Direct LDL: 69 mg/dL (ref 0–99)

## 2021-09-09 SURGERY — CORONARY/GRAFT ACUTE MI REVASCULARIZATION
Anesthesia: Moderate Sedation

## 2021-09-09 MED ORDER — CARVEDILOL 3.125 MG PO TABS
6.2500 mg | ORAL_TABLET | Freq: Two times a day (BID) | ORAL | Status: DC
  Administered 2021-09-09 – 2021-09-11 (×5): 6.25 mg via ORAL
  Filled 2021-09-09: qty 1
  Filled 2021-09-09: qty 2
  Filled 2021-09-09 (×3): qty 1

## 2021-09-09 MED ORDER — ASPIRIN EC 81 MG PO TBEC
81.0000 mg | DELAYED_RELEASE_TABLET | Freq: Every day | ORAL | Status: DC
  Administered 2021-09-10 – 2021-09-11 (×2): 81 mg via ORAL
  Filled 2021-09-09 (×2): qty 1

## 2021-09-09 MED ORDER — METOPROLOL TARTRATE 5 MG/5ML IV SOLN
5.0000 mg | INTRAVENOUS | Status: DC | PRN
Start: 2021-09-09 — End: 2021-09-11

## 2021-09-09 MED ORDER — DAPAGLIFLOZIN PROPANEDIOL 5 MG PO TABS
10.0000 mg | ORAL_TABLET | Freq: Every day | ORAL | Status: DC
  Filled 2021-09-09: qty 2

## 2021-09-09 MED ORDER — GUAIFENESIN 100 MG/5ML PO LIQD
5.0000 mL | ORAL | Status: DC | PRN
  Filled 2021-09-09: qty 5

## 2021-09-09 MED ORDER — SENNOSIDES-DOCUSATE SODIUM 8.6-50 MG PO TABS
1.0000 | ORAL_TABLET | Freq: Every evening | ORAL | Status: DC | PRN
Start: 2021-09-09 — End: 2021-09-11

## 2021-09-09 MED ORDER — MAGNESIUM HYDROXIDE 400 MG/5ML PO SUSP
30.0000 mL | Freq: Every day | ORAL | Status: DC | PRN
Start: 2021-09-09 — End: 2021-09-11

## 2021-09-09 MED ORDER — SODIUM CHLORIDE 0.9 % IV SOLN: INTRAVENOUS | Status: DC

## 2021-09-09 MED ORDER — TRAZODONE HCL 50 MG PO TABS
25.0000 mg | ORAL_TABLET | Freq: Every evening | ORAL | Status: DC | PRN
Start: 2021-09-09 — End: 2021-09-11

## 2021-09-09 MED ORDER — ASPIRIN 81 MG PO CHEW
324.0000 mg | CHEWABLE_TABLET | Freq: Once | ORAL | Status: AC
  Administered 2021-09-09: 324 mg via ORAL

## 2021-09-09 MED ORDER — SACUBITRIL-VALSARTAN 24-26 MG PO TABS
1.0000 | ORAL_TABLET | Freq: Two times a day (BID) | ORAL | Status: DC
  Filled 2021-09-09: qty 1

## 2021-09-09 MED ORDER — METOPROLOL TARTRATE 50 MG PO TABS: 50.0000 mg | ORAL_TABLET | Freq: Two times a day (BID) | ORAL | Status: DC

## 2021-09-09 MED ORDER — IPRATROPIUM-ALBUTEROL 0.5-2.5 (3) MG/3ML IN SOLN: 3.0000 mL | RESPIRATORY_TRACT | Status: DC | PRN

## 2021-09-09 MED ORDER — ASPIRIN 81 MG PO CHEW: 324.0000 mg | CHEWABLE_TABLET | ORAL | Status: AC

## 2021-09-09 MED ORDER — HEPARIN SODIUM (PORCINE) 5000 UNIT/ML IJ SOLN
4000.0000 [IU] | Freq: Once | INTRAMUSCULAR | Status: AC
  Administered 2021-09-09: 4000 [IU] via INTRAVENOUS

## 2021-09-09 MED ORDER — ALPRAZOLAM 0.25 MG PO TABS: 0.2500 mg | ORAL_TABLET | Freq: Two times a day (BID) | ORAL | Status: DC | PRN

## 2021-09-09 MED ORDER — FUROSEMIDE 10 MG/ML IJ SOLN
40.0000 mg | Freq: Once | INTRAMUSCULAR | Status: AC
Start: 2021-09-09 — End: 2021-09-09
  Administered 2021-09-09: 40 mg via INTRAVENOUS
  Filled 2021-09-09: qty 4

## 2021-09-09 MED ORDER — HYDRALAZINE HCL 20 MG/ML IJ SOLN
10.0000 mg | INTRAMUSCULAR | Status: DC | PRN
  Administered 2021-09-09: 10 mg via INTRAVENOUS
  Filled 2021-09-09: qty 1

## 2021-09-09 MED ORDER — SPIRONOLACTONE 25 MG PO TABS
12.5000 mg | ORAL_TABLET | Freq: Every day | ORAL | Status: DC
  Filled 2021-09-09: qty 0.5

## 2021-09-09 MED ORDER — ONDANSETRON HCL 4 MG/2ML IJ SOLN
4.0000 mg | Freq: Once | INTRAMUSCULAR | Status: AC
  Administered 2021-09-09: 4 mg via INTRAVENOUS
  Filled 2021-09-09: qty 2

## 2021-09-09 MED ORDER — ISOSORB DINITRATE-HYDRALAZINE 20-37.5 MG PO TABS
1.0000 | ORAL_TABLET | Freq: Three times a day (TID) | ORAL | Status: DC
  Filled 2021-09-09: qty 1

## 2021-09-09 MED ORDER — ATORVASTATIN CALCIUM 80 MG PO TABS
80.0000 mg | ORAL_TABLET | Freq: Every evening | ORAL | Status: DC
  Administered 2021-09-09 – 2021-09-10 (×2): 80 mg via ORAL
  Filled 2021-09-09 (×3): qty 1

## 2021-09-09 MED ORDER — MORPHINE SULFATE (PF) 4 MG/ML IV SOLN
4.0000 mg | Freq: Once | INTRAVENOUS | Status: AC
  Administered 2021-09-09: 4 mg via INTRAVENOUS
  Filled 2021-09-09: qty 1

## 2021-09-09 MED ORDER — OXYCODONE HCL 5 MG PO TABS
5.0000 mg | ORAL_TABLET | ORAL | Status: DC | PRN
Start: 2021-09-09 — End: 2021-09-11

## 2021-09-09 MED ORDER — ASPIRIN 300 MG RE SUPP
300.0000 mg | RECTAL | Status: AC
  Filled 2021-09-09: qty 1

## 2021-09-09 MED ORDER — ISOSORB DINITRATE-HYDRALAZINE 20-37.5 MG PO TABS
1.0000 | ORAL_TABLET | Freq: Three times a day (TID) | ORAL | Status: DC
  Administered 2021-09-09 – 2021-09-11 (×6): 1 via ORAL
  Filled 2021-09-09 (×7): qty 1

## 2021-09-09 MED ORDER — NITROGLYCERIN 0.4 MG SL SUBL: 0.4000 mg | SUBLINGUAL_TABLET | SUBLINGUAL | Status: DC | PRN

## 2021-09-09 MED ORDER — NITROGLYCERIN IN D5W 200-5 MCG/ML-% IV SOLN
0.0000 ug/min | INTRAVENOUS | Status: DC
  Administered 2021-09-09: 25 ug/min via INTRAVENOUS
  Administered 2021-09-09: 10 ug/min via INTRAVENOUS
  Administered 2021-09-09: 5 ug/min via INTRAVENOUS
  Administered 2021-09-09: 30 ug/min via INTRAVENOUS
  Administered 2021-09-09: 15 ug/min via INTRAVENOUS
  Filled 2021-09-09: qty 250

## 2021-09-09 MED ORDER — ASPIRIN 81 MG PO TBEC: 81.0000 mg | DELAYED_RELEASE_TABLET | Freq: Every day | ORAL | Status: DC

## 2021-09-09 MED ORDER — HEPARIN SODIUM (PORCINE) 5000 UNIT/ML IJ SOLN
5000.0000 [IU] | Freq: Three times a day (TID) | INTRAMUSCULAR | Status: DC
  Administered 2021-09-09 – 2021-09-11 (×5): 5000 [IU] via SUBCUTANEOUS
  Filled 2021-09-09 (×5): qty 1

## 2021-09-09 MED ORDER — CHLORHEXIDINE GLUCONATE CLOTH 2 % EX PADS
6.0000 | MEDICATED_PAD | Freq: Every day | CUTANEOUS | Status: DC
  Administered 2021-09-09 – 2021-09-11 (×3): 6 via TOPICAL

## 2021-09-09 MED ORDER — ACETAMINOPHEN 325 MG PO TABS: 650.0000 mg | ORAL_TABLET | ORAL | Status: DC | PRN

## 2021-09-09 MED ORDER — NITROGLYCERIN 0.4 MG SL SUBL
0.4000 mg | SUBLINGUAL_TABLET | SUBLINGUAL | Status: DC | PRN
  Administered 2021-09-09 (×2): 0.4 mg via SUBLINGUAL

## 2021-09-09 MED ORDER — ONDANSETRON HCL 4 MG/2ML IJ SOLN: 4.0000 mg | Freq: Four times a day (QID) | INTRAMUSCULAR | Status: DC | PRN

## 2021-09-09 NOTE — Consult Note (Signed)
?Cardiology Consultation:  ? ?Patient ID: Clarence [name] ?MRN: [id number]; DOB: 30-Apr-1971 ? ?Admit date: 09/09/2021 ?Date of Consult: 09/09/2021 ? ?PCP:  Jon Billings, NP ?  ?[facility] ?Cardiologist:  Nelva Bush, MD      ? ? ?Patient Profile:  ? ?Clarence [name] is a 51 y.o. male with a hx of coronary artery disease and chronic systolic heart failure who is being seen 09/09/2021 for the evaluation of chest pain and possible inferior STEMI at the request of Dr. Leonides Schanz. ? ?History of Present Illness:  ? ?Clarence [name] is a 52 year old male past medical history of Crohn's disease status post colectomy with ostomy, stage II chronic kidney disease, essential hypertension and previous tobacco use.  He was hospitalized in March of this year with acute systolic heart failure and atypical pneumonia.  Echocardiogram showed an EF of 30 to 35% with moderate LVH and global hypokinesis with mild to moderate mitral regurgitation.  He underwent a right and left cardiac catheterization which showed chronic total occlusion of the mid right coronary artery, diffuse moderate LAD disease, moderate proximal to mid left circumflex disease and 90% diffuse stenosis and a moderate size OM1.  The cardiac cath images were personally reviewed by me.  Right heart catheterization showed moderately elevated filling pressures with moderate to severe pulmonary hypertension and moderately reduced cardiac output.  He was suspected of having a mixed ischemic and nonischemic cardiomyopathy with a component of hypertensive heart disease.  He was not placed on a beta-blocker due to nocturnal bradycardia and suspected sleep apnea.  He was placed on Entresto, spironolactone, furosemide and Farxiga.  However, subsequent labs on April 14 showed evidence of acute on chronic kidney disease with elevation of creatinine to 1.95.  Due to that, all of his heart failure medications were discontinued. ?He came to the ED with substernal chest  burning and tightness feeling that was severe on presentation rated at 8 out of 10.  However, he was noted to be severely hypertensive with systolic blood pressure of 240.  EKG was done and showed sinus tachycardia with small inferior Q waves and 1 mm for inferior ST elevation.  Based on this, code STEMI was activated.  The patient was given 3 sublingual nitroglycerin which decreased his chest pain to 4 out of 10 and then was given 4 mg of morphine with resolution of chest pain.  Given his known coronary anatomy and chronic occlusion of the right coronary artery, I canceled the code STEMI. ?The patient quit smoking last month. ? ? ?Past Medical History:  ?Diagnosis Date  ? CHF (congestive heart failure) ([place])   ? Chronic kidney disease   ? Crohn's disease ([place])   ? Hypertension   ? Proteinuria   ? ? ?Past Surgical History:  ?Procedure Laterality Date  ? COLECTOMY    ? RIGHT/LEFT HEART CATH AND CORONARY ANGIOGRAPHY N/A 08/07/2021  ? Procedure: RIGHT/LEFT HEART CATH AND CORONARY ANGIOGRAPHY;  Surgeon: Nelva Bush, MD;  Location: [facility];  Service: Cardiovascular;  Laterality: N/A;  ?  ? ?Home Medications:  ?Prior to Admission medications   ?Medication Sig Start Date End Date Taking? Authorizing Provider  ?aspirin EC 81 MG EC tablet Take 1 tablet (81 mg total) by mouth daily. Swallow whole. 08/11/21 09/10/21  Wyvonnia Dusky, MD  ?atorvastatin (LIPITOR) 80 MG tablet Take 1 tablet (80 mg total) by mouth every evening. 08/10/21 09/09/21  Wyvonnia Dusky, MD  ?dapagliflozin propanediol (FARXIGA) 10 MG TABS tablet Take 1  tablet (10 mg total) by mouth daily. 08/11/21 09/10/21  Wyvonnia Dusky, MD  ?furosemide (LASIX) 40 MG tablet Take 1 tablet (40 mg total) by mouth daily. 08/11/21 09/10/21  Wyvonnia Dusky, MD  ?sacubitril-valsartan (ENTRESTO) 24-26 MG Take 1 tablet by mouth 2 (two) times daily. 08/29/21   Rise Mu, PA-C  ?spironolactone (ALDACTONE) 25 MG tablet Take 0.5 tablets (12.5 mg total)  by mouth daily. 08/29/21 11/27/21  Rise Mu, PA-C  ? ? ?Inpatient Medications: ?Scheduled Meds: ? carvedilol  6.25 mg Oral BID WC  ? ?Continuous Infusions: ? nitroGLYCERIN 5 mcg/min (09/09/21 0410)  ? ?PRN Meds: ?nitroGLYCERIN ? ?Allergies:    ?Allergies  ?Allergen Reactions  ? Ibuprofen Other (See Comments) and Hives  ?  Other reaction(s): GI Upset (intolerance) ?Hives ?  ? Neomycin-Bacitracin Zn-Polymyx   ?  Other reaction(s): Other (See Comments)  ? Sulfacetamide Other (See Comments)  ? ? ?Social History:   ?Social History  ? ?Socioeconomic History  ? Marital status: Single  ?  Spouse name: Not on file  ? Number of children: Not on file  ? Years of education: Not on file  ? Highest education level: Not on file  ?Occupational History  ? Not on file  ?Tobacco Use  ? Smoking status: Former  ?  Types: Cigars  ?  Quit date: 08/13/2021  ?  Years since quitting: 0.0  ? Smokeless tobacco: Not on file  ?Vaping Use  ? Vaping Use: Never used  ?Substance and Sexual Activity  ? Alcohol use: Not Currently  ?  Alcohol/week: 1.0 standard drink  ?  Types: 1 Standard drinks or equivalent per week  ? Drug use: Never  ? Sexual activity: Not on file  ?Other Topics Concern  ? Not on file  ?Social History Narrative  ? Not on file  ? ?Social Determinants of Health  ? ?Financial Resource Strain: Not on file  ?Food Insecurity: Not on file  ?Transportation Needs: Not on file  ?Physical Activity: Not on file  ?Stress: Not on file  ?Social Connections: Not on file  ?Intimate Partner Violence: Not on file  ?  ?Family History:   ? ?Family History  ?Problem Relation Age of Onset  ? Heart disease Maternal Uncle   ? Pancreatic cancer Maternal Uncle   ? Cancer Maternal Aunt   ?  ? ?ROS:  ?Please see the history of present illness.  ? ?All other ROS reviewed and negative.    ? ?Physical Exam/Data:  ? ?Vitals:  ? 09/09/21 0322 09/09/21 0335 09/09/21 0345 09/09/21 0400  ?BP: (!) 207/156 (!) 213/150 (!) 190/141 (!) 181/125  ?Pulse: (!) 108 (!) 127  (!) 110 (!) 106  ?Resp: 20 (!) 23 20 17   ?Temp: 98.2 ?F (36.8 ?C)     ?TempSrc: Oral     ?SpO2: 100% 100% 100% 100%  ?Weight:      ?Height:      ? ? ?Intake/Output Summary (Last 24 hours) at 09/09/2021 0418 ?Last data filed at 09/09/2021 0405 ?Gross per 24 hour  ?Intake 4 ml  ?Output --  ?Net 4 ml  ? ? ?  09/09/2021  ?  3:21 AM 08/29/2021  ?  2:33 PM 08/29/2021  ?  8:13 AM  ?Last 3 Weights  ?Weight (lbs) 207 lb 235 lb 14.3 oz 207 lb  ?Weight (kg) 93.895 kg 107 kg 93.895 kg  ?   ?Body mass index is 29.7 kg/m?.  ?General:  Well nourished, well developed, in no  acute distress ?HEENT: normal ?Neck: no JVD ?Vascular: No carotid bruits; Distal pulses 2+ bilaterally ?Cardiac:  normal S1, S2; RRR; no murmur  ?Lungs:  clear to auscultation bilaterally, no wheezing, rhonchi or rales  ?Abd: soft, nontender, no hepatomegaly  ?Ext: no edema ?Musculoskeletal:  No deformities, BUE and BLE strength normal and equal ?Skin: warm and dry  ?Neuro:  CNs 2-12 intact, no focal abnormalities noted ?Psych:  Normal affect  ? ?EKG:  The EKG was personally reviewed and demonstrates: Sinus tachycardia with left atrial enlargement, LVH with QRS widening, 1 mm of inferior ST elevation with small Q waves.  ST changes are slightly more prominent than his EKG from last month. ?Telemetry:  Telemetry was personally reviewed and demonstrates: Sinus tachycardia ? ?Relevant CV Studies: ? ? ?Laboratory Data: ? ?High Sensitivity Troponin:   ?Recent Labs  ?Lab 09/09/21 ?0329  ?TROPONINIHS 55*  ?   ?Chemistry ?Recent Labs  ?Lab 09/02/21 ?1218 09/09/21 ?0329  ?NA 137 138  ?K 5.4* 3.9  ?CL 109 106  ?CO2 20* 26  ?GLUCOSE 139* 111*  ?BUN 38* 22*  ?CREATININE 1.73* 1.31*  ?CALCIUM 9.8 9.4  ?GFRNONAA 47* >60  ?ANIONGAP 8 6  ?  ?Recent Labs  ?Lab 09/09/21 ?0329  ?PROT 7.6  ?ALBUMIN 3.8  ?AST 41  ?ALT 53*  ?ALKPHOS 112  ?BILITOT 0.6  ? ?Lipids  ?Recent Labs  ?Lab 09/09/21 ?0329  ?CHOL 174  ?TRIG 520*  ?HDL 35*  ?LDLCALC UNABLE TO CALCULATE IF TRIGLYCERIDE OVER 400 mg/dL   ?CHOLHDL 5.0  ?  ?Hematology ?Recent Labs  ?Lab 09/09/21 ?0329  ?WBC 10.1  ?RBC 5.62  ?HGB 15.7  ?HCT 46.1  ?MCV 82.0  ?MCH 27.9  ?MCHC 34.1  ?RDW 13.0  ?PLT 266  ? ?Thyroid No results for input(s): TSH,

## 2021-09-09 NOTE — Progress Notes (Signed)
?PROGRESS NOTE ? ? ? ?Clarence [name]  LJQ:[id number] DOB: 1971/05/14 DOA: 09/09/2021 ?PCP: Jon Billings, NP  ? 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  Initially code STEMI was called but after being evaluated by cardiology and this was canceled.  Due to persistent chest pain despite of sublingual nitroglycerin and morphine he was started on nitroglycerin drip and admitted to stepdown unit. ? ? ?Assessment & Plan: ? Principal Problem: ?  Acute on chronic combined systolic and diastolic CHF (congestive heart failure) ([place]) ?Active Problems: ?  Hypertensive urgency ?  Non-STEMI (non-ST elevated myocardial infarction) ([place]) ?  CKD (chronic kidney disease) stage 2, GFR 60-89 ml/min ?  Dyslipidemia ?  ? ? ?Assessment and Plan: ?* Acute on chronic combined systolic and diastolic CHF (congestive heart failure) ([place]) ?-Previous echo had shown EF of 30% with grade 2 DD.  Class III symptoms.  Shortness of breath and fluid overload in the setting of hypertensive urgency.  Gentle diuretics, monitor renal function.  Continue low-dose Coreg.  Slowly can introduce Entresto/Farxiga and hydralazine depending on renal function.. ? ?Hypertensive urgency, improved ?Unstable angina ?-UDS is negative.  Not on any antihypertensive medications at home.  Currently on nitroglycerin drip, slowly will wean this off.  Slowly started Coreg, will allow cardiology team to reintroduce his cardiac medications slowly. ? ?Unstable angina ?History of CAD ?-Initially thought to be STEMI and has known history of chronically occluded RCA.  Cardiology recommending heparin drip if troponin trends upwards ?-Aspirin and statin ? ?CKD (chronic kidney disease) stage 2, GFR 60-89 ml/min ?-Renal function appears to have improved.  We will allow cardiology team to slowly reintroduce his medications as  deemed appropriate. ? ?Dyslipidemia ?-Lipitor ? ? ? ?DVT prophylaxis:   Subcu heparin ?Code Status: Full code ?Family Communication:   ? ?Status is: Inpatient ?Remains inpatient appropriate because: Currently patient is on nitroglycerin drip, ongoing adjustments.  Maintain hospital stay, once off nitroglycerin drip he will be transferred to telemetry floor. ? ? ? ? ?Subjective: ?Seen and examined at bedside, denies any complaints this morning.  Tells me he was taken off some of his blood pressure medications about 2 weeks ago by his outpatient provider therefore he has not been taking this. ? ? ?Examination: ? ?General exam: Appears calm and comfortable  ?Respiratory system: Clear to auscultation. Respiratory effort normal. ?Cardiovascular system: S1 & S2 heard, RRR. No JVD, murmurs, rubs, gallops or clicks. No pedal edema. ?Gastrointestinal system: Abdomen is nondistended, soft and nontender. No organomegaly or masses felt. Normal bowel sounds heard. ?Central nervous system: Alert and oriented. No focal neurological deficits. ?Extremities: Symmetric 5 x 5 power. ?Skin: No rashes, lesions or ulcers ?Psychiatry: Judgement and insight appear normal. Mood & affect appropriate.  ? ? ? ?Objective: ?Vitals:  ? 09/09/21 0727 09/09/21 0730 09/09/21 0800 09/09/21 0830  ?BP:  (!) 150/107 (!) 136/99 117/84  ?Pulse:  68 72 85  ?Resp:  (!) 23 20 20   ?Temp: 97.8 ?F (36.6 ?C)     ?TempSrc: Oral     ?SpO2:  100% 100% 96%  ?Weight:      ?Height:      ? ? ?Intake/Output Summary (Last 24 hours) at 09/09/2021 0840 ?Last data filed at 09/09/2021 0807 ?Gross per 24 hour  ?Intake 58.84 ml  ?Output 1450 ml  ?Net -1391.16 ml  ? ?Filed Weights  ?  09/09/21 0321 09/09/21 9163  ?Weight: 93.9 kg 95.4 kg  ? ? ? ?Data Reviewed:  ? ?CBC: ?Recent Labs  ?Lab 09/09/21 ?0329  ?WBC 10.1  ?NEUTROABS 6.0  ?HGB 15.7  ?HCT 46.1  ?MCV 82.0  ?PLT 266  ? ?Basic Metabolic Panel: ?Recent Labs  ?Lab 09/02/21 ?1218 09/09/21 ?0329  ?NA 137 138  ?K 5.4* 3.9  ?CL 109  106  ?CO2 20* 26  ?GLUCOSE 139* 111*  ?BUN 38* 22*  ?CREATININE 1.73* 1.31*  ?CALCIUM 9.8 9.4  ? ?GFR: ?Estimated Creatinine Clearance: 76.5 mL/min (A) (by C-G formula based on SCr of 1.31 mg/dL (H)). ?Liver Function Tests: ?Recent Labs  ?Lab 09/09/21 ?0329  ?AST 41  ?ALT 53*  ?ALKPHOS 112  ?BILITOT 0.6  ?PROT 7.6  ?ALBUMIN 3.8  ? ?No results for input(s): LIPASE, AMYLASE in the last 168 hours. ?No results for input(s): AMMONIA in the last 168 hours. ?Coagulation Profile: ?Recent Labs  ?Lab 09/09/21 ?8466  ?INR 1.0  ? ?Cardiac Enzymes: ?No results for input(s): CKTOTAL, CKMB, CKMBINDEX, TROPONINI in the last 168 hours. ?BNP (last 3 results) ?No results for input(s): PROBNP in the last 8760 hours. ?HbA1C: ?No results for input(s): HGBA1C in the last 72 hours. ?CBG: ?Recent Labs  ?Lab 09/09/21 ?5993  ?GLUCAP 109*  ? ?Lipid Profile: ?Recent Labs  ?  09/09/21 ?0329  ?CHOL 174  ?HDL 35*  ?LDLCALC UNABLE TO CALCULATE IF TRIGLYCERIDE OVER 400 mg/dL  ?TRIG 520*  ?CHOLHDL 5.0  ? ?Thyroid Function Tests: ?No results for input(s): TSH, T4TOTAL, FREET4, T3FREE, THYROIDAB in the last 72 hours. ?Anemia Panel: ?No results for input(s): VITAMINB12, FOLATE, FERRITIN, TIBC, IRON, RETICCTPCT in the last 72 hours. ?Sepsis Labs: ?No results for input(s): PROCALCITON, LATICACIDVEN in the last 168 hours. ? ?No results found for this or any previous visit (from the past 240 hour(s)).  ? ? ? ? ? ?Radiology Studies: ?DG Chest Portable 1 View ? ?Result Date: 09/09/2021 ?CLINICAL DATA:  Chest pain and shortness of breath EXAM: PORTABLE CHEST 1 VIEW COMPARISON:  08/04/2021 FINDINGS: Cardiomegaly and vascular pedicle widening. Congested appearance of vessels. No effusion or pneumothorax. Artifact from EKG leads and defibrillator pads. IMPRESSION: Cardiomegaly and mild vascular congestion. Electronically Signed   By: Jorje Guild M.D.   On: 09/09/2021 04:01   ? ? ? ? ? ?Scheduled Meds: ? aspirin  324 mg Oral NOW  ? Or  ? aspirin  300 mg Rectal  NOW  ? [START ON 09/10/2021] aspirin EC  81 mg Oral Daily  ? atorvastatin  80 mg Oral QPM  ? carvedilol  6.25 mg Oral BID WC  ? dapagliflozin propanediol  10 mg Oral Daily  ? metoprolol tartrate  50 mg Oral BID  ? sacubitril-valsartan  1 tablet Oral BID  ? spironolactone  12.5 mg Oral Daily  ? ?Continuous Infusions: ? sodium chloride 50 mL/hr at 09/09/21 0800  ? nitroGLYCERIN 50 mcg/min (09/09/21 0807)  ? ? ? LOS: 0 days  ? ?Time spent= 35 mins ? ? ? ?Arlyn Buerkle Arsenio Loader, MD ?[facility] ? ?If 7PM-7AM, please contact night-coverage ? ?09/09/2021, 8:40 AM  ? ?

## 2021-09-09 NOTE — Assessment & Plan Note (Signed)
-   We will continue statin therapy. 

## 2021-09-09 NOTE — Assessment & Plan Note (Addendum)
-   The patient be admitted to a stepdown unit bed. ?- We will continue diuresis with IV Lasix. ?- We will continue IV nitroglycerin drip. ?- We will follow serial troponins. ?We will continue Jordan as well as Aldactone and aspirin. ?- Cardiology consult will be obtained. ?- I notified Dr. Curt Bears and Dr. Saunders Revel who is covering today is aware about the patient as well. ?

## 2021-09-09 NOTE — ED Notes (Signed)
Defibrillator pads applied with Zoll monitor initiated. Pt placed on 2L oxygen by nasal canula. All of pts clothing and belongings removed and placed into personal belongings bag with hospital gown provided for pt to wear. MD at bedside.  ?

## 2021-09-09 NOTE — ED Notes (Signed)
Called [facility] ( [facility]) cancelled CODE STEMI ?

## 2021-09-09 NOTE — Assessment & Plan Note (Signed)
-   The patient was placed on IV heparin drip. ?- Elevated troponin could be demand ischemia due to his acute CHF and hypertensive urgency. ?- We will follow serial troponin aspirin ?- We will obtain a 2D echo and cardiology consult. ?

## 2021-09-09 NOTE — ED Notes (Signed)
Cardiologist arrived and at bedside.  ?

## 2021-09-09 NOTE — Assessment & Plan Note (Signed)
-   We will monitor her renal functions with diuresis. ?

## 2021-09-09 NOTE — ED Notes (Signed)
CODE STEMI called to [facility] ([facility])  ?

## 2021-09-09 NOTE — ED Provider Notes (Signed)
? ?[facility] ?Provider Note ? ? ? Event Date/Time  ? First MD Initiated Contact with Patient 09/09/21 [phone]   ?  (approximate) ? ? ?History  ? ?Chest Pain ? ? ?HPI ? ?Clarence [name] is a 51 y.o. male with history of hypertension, chronic kidney disease, coronary artery disease, combined nonischemic and ischemic cardiomyopathy, pulmonary hypertension, Crohn's status post colostomy who presents to the emergency department with complaints of left-sided chest pressure without radiation and shortness of breath that started while at rest and watching TV about an hour prior to arrival.  Patient drove himself to the emergency department.  States he has had some diaphoresis and dizziness but no nausea or vomiting.  No fever or cough.  No lower extremity swelling or pain.  Patient underwent cardiac catheterization by Dr. Saunders Revel with cardiology at the end of March 2023.  Was found to have complete stenosis that appeared chronic of the RCA.  They recommended medical therapy.  He was taken off of Entresto and spironolactone when he was seen in the emergency department for AKI and hypokalemia per Dr. Rockey Situ who is the cardiologist on-call.  Patient states that he is only taking 2 of his medications but he is not sure what they are.  It looks like he is supposed to be on Farxiga, Lasix and Lipitor but he cannot recall which to he is taking.  States he last took his medications at midnight.  He is not taking aspirin or any other anticoagulant.  Denies any tobacco use.  No history of PE or DVT. ? ? ? ?History provided by patient. ? ? ? ?Past Medical History:  ?Diagnosis Date  ? CHF (congestive heart failure) ([place])   ? Chronic kidney disease   ? Crohn's disease ([place])   ? Hypertension   ? Proteinuria   ? ? ?Past Surgical History:  ?Procedure Laterality Date  ? COLECTOMY    ? RIGHT/LEFT HEART CATH AND CORONARY ANGIOGRAPHY N/A 08/07/2021  ? Procedure: RIGHT/LEFT HEART CATH AND CORONARY ANGIOGRAPHY;  Surgeon: Nelva Bush, MD;  Location: [facility];  Service: Cardiovascular;  Laterality: N/A;  ? ? ?MEDICATIONS:  ?Prior to Admission medications   ?Medication Sig Start Date End Date Taking? Authorizing Provider  ?aspirin EC 81 MG EC tablet Take 1 tablet (81 mg total) by mouth daily. Swallow whole. 08/11/21 09/10/21  Wyvonnia Dusky, MD  ?atorvastatin (LIPITOR) 80 MG tablet Take 1 tablet (80 mg total) by mouth every evening. 08/10/21 09/09/21  Wyvonnia Dusky, MD  ?dapagliflozin propanediol (FARXIGA) 10 MG TABS tablet Take 1 tablet (10 mg total) by mouth daily. 08/11/21 09/10/21  Wyvonnia Dusky, MD  ?furosemide (LASIX) 40 MG tablet Take 1 tablet (40 mg total) by mouth daily. 08/11/21 09/10/21  Wyvonnia Dusky, MD  ?sacubitril-valsartan (ENTRESTO) 24-26 MG Take 1 tablet by mouth 2 (two) times daily. 08/29/21   Rise Mu, PA-C  ?spironolactone (ALDACTONE) 25 MG tablet Take 0.5 tablets (12.5 mg total) by mouth daily. 08/29/21 11/27/21  Rise Mu, PA-C  ? ? ?Physical Exam  ? ?Triage Vital Signs: ?ED Triage Vitals  ?Enc Vitals Group  ?   BP 09/09/21 0322 (!) 207/156  ?   Pulse Rate 09/09/21 0322 (!) 108  ?   Resp 09/09/21 0322 20  ?   Temp 09/09/21 0322 98.2 ?F (36.8 ?C)  ?   Temp Source 09/09/21 0322 Oral  ?   SpO2 09/09/21 0322 100 %  ?  Weight 09/09/21 0321 207 lb (93.9 kg)  ?   Height 09/09/21 0321 5' 10"  (1.778 m)  ?   Head Circumference --   ?   Peak Flow --   ?   Pain Score 09/09/21 0321 8  ?   Pain Loc --   ?   Pain Edu? --   ?   Excl. in [place]? --   ? ? ?Most recent vital signs: ?Vitals:  ? 09/09/21 0455 09/09/21 0500  ?BP: (!) 171/122 (!) 179/120  ?Pulse: 97 96  ?Resp: 18 (!) 22  ?Temp:    ?SpO2: 100% 100%  ? ? ?CONSTITUTIONAL: Alert and oriented and responds appropriately to questions.  Appears uncomfortable, slightly diaphoretic ?HEAD: Normocephalic, atraumatic ?EYES: Conjunctivae clear, pupils appear equal, sclera nonicteric ?ENT: normal nose; moist mucous membranes ?NECK: Supple, normal  ROM ?CARD: Regular and tachycardic; S1 and S2 appreciated; no murmurs, no clicks, no rubs, no gallops ?RESP: Normal chest excursion without splinting or tachypnea; breath sounds clear and equal bilaterally; no wheezes, no rhonchi, no rales, no hypoxia or respiratory distress, speaking full sentences ?ABD/GI: Normal bowel sounds; non-distended; soft, non-tender, no rebound, no guarding, no peritoneal signs ?BACK: The back appears normal ?EXT: Normal ROM in all joints; no deformity noted, no edema; no cyanosis, no calf tenderness or calf swelling ?SKIN: Normal color for age and race; warm; no rash on exposed skin ?NEURO: Moves all extremities equally, normal speech ?PSYCH: The patient's mood and manner are appropriate. ? ? ?ED Results / Procedures / Treatments  ? ?LABS: ?(all labs ordered are listed, but only abnormal results are displayed) ?Labs Reviewed  ?COMPREHENSIVE METABOLIC PANEL - Abnormal; Notable for the following components:  ?    Result Value  ? Glucose, Bld 111 (*)   ? BUN 22 (*)   ? Creatinine, Ser 1.31 (*)   ? ALT 53 (*)   ? All other components within normal limits  ?LIPID PANEL - Abnormal; Notable for the following components:  ? Triglycerides 520 (*)   ? HDL 35 (*)   ? All other components within normal limits  ?BRAIN NATRIURETIC PEPTIDE - Abnormal; Notable for the following components:  ? B Natriuretic Peptide 478.7 (*)   ? All other components within normal limits  ?TROPONIN I (HIGH SENSITIVITY) - Abnormal; Notable for the following components:  ? Troponin I (High Sensitivity) 55 (*)   ? All other components within normal limits  ?CBC WITH DIFFERENTIAL/PLATELET  ?PROTIME-INR  ?APTT  ?URINE DRUG SCREEN, QUALITATIVE ([facility] ONLY)  ?LDL CHOLESTEROL, DIRECT  ?TROPONIN I (HIGH SENSITIVITY)  ? ? ? ?EKG: ? EKG Interpretation ? ?Date/Time:  Tuesday September 09 2021 03:27:07 EDT ?Ventricular Rate:  115 ?PR Interval:  172 ?QRS Duration: 120 ?QT Interval:  332 ?QTC Calculation: 459 ?R Axis:   56 ?Text  Interpretation:  Critical Test Result: STEMI Sinus tachycardia Possible Left atrial enlargement Left ventricular hypertrophy with QRS widening ( Cornell product ) Inferior infarct , possibly acute ** ** ACUTE MI / STEMI ** ** Consider right ventricular involvement in acute inferior infarct Abnormal ECG When compared with ECG of 29-Aug-2021 17:47, PREVIOUS ECG IS PRESENT Confirmed by Pryor Curia [phone]) on 09/09/2021 3:33:21 AM ?  ? ?  ? ? ? ? EKG Interpretation ? ?Date/Time:  Tuesday September 09 2021 03:45:27 EDT ?Ventricular Rate:  107 ?PR Interval:  178 ?QRS Duration: 118 ?QT Interval:  362 ?QTC Calculation: 483 ?R Axis:   66 ?Text Interpretation: Sinus tachycardia Probable left atrial enlargement Nonspecific intraventricular  conduction delay Probable inferior infarct, recent Lateral leads are also involved Confirmed by Pryor Curia [phone]) on 09/09/2021 3:57:54 AM ?  ? ?  ? ? ? ?RADIOLOGY: ?My personal review and interpretation of imaging: Chest x-ray shows diffuse pulmonary edema. ? ?I have personally reviewed all radiology reports.   ?DG Chest Portable 1 View ? ?Result Date: 09/09/2021 ?CLINICAL DATA:  Chest pain and shortness of breath EXAM: PORTABLE CHEST 1 VIEW COMPARISON:  08/04/2021 FINDINGS: Cardiomegaly and vascular pedicle widening. Congested appearance of vessels. No effusion or pneumothorax. Artifact from EKG leads and defibrillator pads. IMPRESSION: Cardiomegaly and mild vascular congestion. Electronically Signed   By: Jorje Guild M.D.   On: 09/09/2021 04:01   ? ? ?PROCEDURES: ? ?Critical Care performed: Yes, see critical care procedure note(s) ? ? ?CRITICAL CARE ?Performed by: Cyril Mourning Lashae Wollenberg ? ? ?Total critical care time: 55 minutes ? ?Critical care time was exclusive of separately billable procedures and treating other patients. ? ?Critical care was necessary to treat or prevent imminent or life-threatening deterioration. ? ?Critical care was time spent personally by me on the following activities:  development of treatment plan with patient and/or surrogate as well as nursing, discussions with consultants, evaluation of patient's response to treatment, examination of patient, obtaining history from patient or surroga

## 2021-09-09 NOTE — H&P (Signed)
?  ?  ?[place] ? ? ?PATIENT NAME: Clarence [name]   ? ?MR#:  [id number] ? ?DATE OF BIRTH:  Feb 21, 1971 ? ?DATE OF ADMISSION:  09/09/2021 ? ?PRIMARY CARE PHYSICIAN: Jon Billings, NP  ? ?Patient is coming from: Home ? ?REQUESTING/REFERRING PHYSICIAN: Ward, Delice Bison, DO ? ?CHIEF COMPLAINT:  ? ?Chief Complaint  ?Patient presents with  ?? Chest Pain  ? ? ?HISTORY OF PRESENT ILLNESS:  ?Clarence [name] is a 51 y.o. Caucasian male with medical history significant for systolic and diastolic CHF, stage IIIa chronic kidney disease, Crohn's disease and hypertension, who presented to the emergency room with acute onset of midsternal chest pain felt as a tightness and squeezing pain and graded mild to moderate in intensity with associated dyspnea as well as diaphoresis and dizziness that occurred about an hour before he came to the ER.  He denied any nausea or vomiting or abdominal pain.  No fever or chills.  No cough or wheezing.  No dysuria, oliguria or hematuria or flank pain. ? ?ED Course: When he came to the ER BP was 207/156 and later on has been as high as 213/150 with heart rate of 108 and later 127 and respirate of 20 and later 23 with pulse symmetry of 100% on room air and afebrile status.  Labs revealed a creatinine 1.31 and a BUN of 22 with glucose 111 and ALT 53 with AST of 41.  CBC was within normal.  PTT was 64 INR 1 and PT 13.3.  High-sensitivity troponin I was initially 55 and later 139.  BNP was 478.7.  Lipid profile showed HDL 35 LDL was difficult to calculate and triglycerides 520 with total cholesterol of 174.   ?EKG as reviewed by me : EKG initially showed sinus tachycardia with rate 115 with LVH with QRS widening and Q early waves inferiorly.  Inferior.  EKG showed sinus tachycardia with rate 107 with probable left atrial enlargement and nonspecific intraventricular conduction delay and small Q waves inferiorly. ?Imaging: Chest ray showed cardiomegaly with mild vascular congestion.  The patient had  a 2D echo on 08/05/2021 that revealed an EF of 30 to 35% and grade 2 diastolic dysfunction with mildly dilated left atrium and mild to moderate mitral regurgitation. ? ?The patient was given 3 sublingual nitroglycerin, 4 baby aspirin, 4 mg of IV morphine sulfate, 40 mg of IV Lasix, IV nitroglycerin drip, IV heparin bolus and drip.  He will be admitted to a stepdown unit bed for further evaluation and management ?PAST MEDICAL HISTORY:  ? ?Past Medical History:  ?Diagnosis Date  ?? CHF (congestive heart failure) ([place])   ?? Chronic kidney disease   ?? Crohn's disease ([place])   ?? Hypertension   ?? Proteinuria   ? ? ?PAST SURGICAL HISTORY:  ? ?Past Surgical History:  ?Procedure Laterality Date  ?? COLECTOMY    ?? RIGHT/LEFT HEART CATH AND CORONARY ANGIOGRAPHY N/A 08/07/2021  ? Procedure: RIGHT/LEFT HEART CATH AND CORONARY ANGIOGRAPHY;  Surgeon: Nelva Bush, MD;  Location: [facility];  Service: Cardiovascular;  Laterality: N/A;  ? ? ?SOCIAL HISTORY:  ? ?Social History  ? ?Tobacco Use  ?? Smoking status: Former  ?  Types: Cigars  ?  Quit date: 08/13/2021  ?  Years since quitting: 0.0  ?? Smokeless tobacco: Not on file  ?Substance Use Topics  ?? Alcohol use: Not Currently  ?  Alcohol/week: 1.0 standard drink  ?  Types: 1 Standard drinks or equivalent per week  ? ? ?  FAMILY HISTORY:  ? ?Family History  ?Problem Relation Age of Onset  ?? Heart disease Maternal Uncle   ?? Pancreatic cancer Maternal Uncle   ?? Cancer Maternal Aunt   ? ? ?DRUG ALLERGIES:  ? ?Allergies  ?Allergen Reactions  ?? Ibuprofen Other (See Comments) and Hives  ?  Other reaction(s): GI Upset (intolerance) ?Hives ?  ?? Neomycin-Bacitracin Zn-Polymyx   ?  Other reaction(s): Other (See Comments)  ?? Sulfacetamide Other (See Comments)  ? ? ?REVIEW OF SYSTEMS:  ? ?ROS ?As per history of present illness. All pertinent systems were reviewed above. Constitutional, HEENT, cardiovascular, respiratory, GI, GU, musculoskeletal, neuro, psychiatric, endocrine,  integumentary and hematologic systems were reviewed and are otherwise negative/unremarkable except for positive findings mentioned above in the HPI. ? ? ?MEDICATIONS AT HOME:  ? ?Prior to Admission medications   ?Medication Sig Start Date End Date Taking? Authorizing Provider  ?aspirin EC 81 MG EC tablet Take 1 tablet (81 mg total) by mouth daily. Swallow whole. 08/11/21 09/10/21  Wyvonnia Dusky, MD  ?atorvastatin (LIPITOR) 80 MG tablet Take 1 tablet (80 mg total) by mouth every evening. 08/10/21 09/09/21  Wyvonnia Dusky, MD  ?dapagliflozin propanediol (FARXIGA) 10 MG TABS tablet Take 1 tablet (10 mg total) by mouth daily. 08/11/21 09/10/21  Wyvonnia Dusky, MD  ?furosemide (LASIX) 40 MG tablet Take 1 tablet (40 mg total) by mouth daily. 08/11/21 09/10/21  Wyvonnia Dusky, MD  ?sacubitril-valsartan (ENTRESTO) 24-26 MG Take 1 tablet by mouth 2 (two) times daily. 08/29/21   Rise Mu, PA-C  ?spironolactone (ALDACTONE) 25 MG tablet Take 0.5 tablets (12.5 mg total) by mouth daily. 08/29/21 11/27/21  Rise Mu, PA-C  ? ?  ? ?VITAL SIGNS:  ?Blood pressure (!) 158/110, pulse 70, temperature 98.1 ?F (36.7 ?C), temperature source Oral, resp. rate 19, height 5' 10"  (2.637 m), weight 95.4 kg, SpO2 99 %. ? ?PHYSICAL EXAMINATION:  ?Physical Exam ? ?GENERAL:  51 y.o.-year-old patient lying in the bed with no acute distress.  ?EYES: Pupils equal, round, reactive to light and accommodation. No scleral icterus. Extraocular muscles intact.  ?HEENT: Head atraumatic, normocephalic. Oropharynx and nasopharynx clear.  ?NECK:  Supple, no jugular venous distention. No thyroid enlargement, no tenderness.  ?LUNGS: Normal breath sounds bilaterally, no wheezing, rales,rhonchi or crepitation. No use of accessory muscles of respiration.  ?CARDIOVASCULAR: Regular rate and rhythm, S1, S2 normal. No murmurs, rubs, or gallops.  ?ABDOMEN: Soft, nondistended, nontender. Bowel sounds present. No organomegaly or mass.  ?EXTREMITIES: No  pedal edema, cyanosis, or clubbing.  ?NEUROLOGIC: Cranial nerves II through XII are intact. Muscle strength 5/5 in all extremities. Sensation intact. Gait not checked.  ?PSYCHIATRIC: The patient is alert and oriented x 3.  Normal affect and good eye contact. ?SKIN: No obvious rash, lesion, or ulcer.  ? ?LABORATORY PANEL:  ? ?CBC ?Recent Labs  ?Lab 09/09/21 ?0329  ?WBC 10.1  ?HGB 15.7  ?HCT 46.1  ?PLT 266  ? ?------------------------------------------------------------------------------------------------------------------ ? ?Chemistries  ?Recent Labs  ?Lab 09/09/21 ?0329  ?NA 138  ?K 3.9  ?CL 106  ?CO2 26  ?GLUCOSE 111*  ?BUN 22*  ?CREATININE 1.31*  ?CALCIUM 9.4  ?AST 41  ?ALT 53*  ?ALKPHOS 112  ?BILITOT 0.6  ? ?------------------------------------------------------------------------------------------------------------------ ? ?Cardiac Enzymes ?No results for input(s): TROPONINI in the last 168 hours. ?------------------------------------------------------------------------------------------------------------------ ? ?RADIOLOGY:  ?DG Chest Portable 1 View ? ?Result Date: 09/09/2021 ?CLINICAL DATA:  Chest pain and shortness of breath EXAM: PORTABLE CHEST 1 VIEW COMPARISON:  08/04/2021  FINDINGS: Cardiomegaly and vascular pedicle widening. Congested appearance of vessels. No effusion or pneumothorax. Artifact from EKG leads and defibrillator pads. IMPRESSION: Cardiomegaly and mild vascular congestion. Electronically Signed   By: Jorje Guild M.D.   On: 09/09/2021 04:01   ? ? ? ?IMPRESSION AND PLAN:  ?Assessment and Plan: ?* Acute on chronic combined systolic and diastolic CHF (congestive heart failure) ([place]) ?- The patient be admitted to a stepdown unit bed. ?- We will continue diuresis with IV Lasix. ?- We will continue IV nitroglycerin drip. ?- We will follow serial troponins. ?We will continue Jordan as well as Aldactone and aspirin. ?- Cardiology consult will be obtained. ?- I notified Dr. Curt Bears  and Dr. Saunders Revel who is covering today is aware about the patient as well. ? ?Hypertensive urgency ?- The patient will be placed on IV nitroglycerin drip that will be titrated for adequate BP control. ?- We wil

## 2021-09-09 NOTE — Progress Notes (Signed)
[facility] ? ?Date: 09/09/21 ? ?Time: 1:34 PM ? ?Please see full consult by Dr. Sophronia Simas earlier today.  Clarence [name] is feeling better though he still has vague tightness in his chest (1/10 in intensity).  He is currently on IV nitroglycerin to control blood pressure and for relief of his chest pain.  This has been weaned down to 20 mg micrograms per minute.  Breathing is back to baseline.  Labs notable for gradual uptrend in troponin from 55 on initial presentation to 139 at 5:30 AM.  I have reviewed Clarence [name] recent cardiac catheterization and suspect that his chest pain and EKG changes were reflective of demand ischemia in the setting of fixed CAD including CTO of the RCA and presenting hypertensive emergency.  Carvedilol 6.25 mg twice daily can be continued.  I will add hydralazine and isosorbide dinitrate for blood pressure control and goal-directed medical therapy in the setting of HFrEF.  Defer rechallenging with Entresto and aldosterone antagonist in the setting of recent acute kidney injury and hyperkalemia.  It may be worthwhile to try low-dose ARB without aldosterone antagonist during this admission with close attention to renal function.  Net even fluid balance should be maintained. ? ?Clarence Bush, MD ?[facility] ? ?

## 2021-09-09 NOTE — Plan of Care (Signed)

## 2021-09-09 NOTE — Assessment & Plan Note (Signed)
-   The patient will be placed on IV nitroglycerin drip that will be titrated for adequate BP control. ?- We will continue his antihypertensives. ?

## 2021-09-09 NOTE — ED Triage Notes (Addendum)
Pt presents via POV with complaints of midsternal chest pain without radiation. Describes it as "heaviness". Pt diaphoretic in triage. Hx of HTN - non-complaint with his medications. Denies SOB.  ?

## 2021-09-10 ENCOUNTER — Other Ambulatory Visit: Payer: Self-pay

## 2021-09-10 DIAGNOSIS — I160 Hypertensive urgency: Secondary | ICD-10-CM | POA: Diagnosis not present

## 2021-09-10 DIAGNOSIS — I5043 Acute on chronic combined systolic (congestive) and diastolic (congestive) heart failure: Secondary | ICD-10-CM | POA: Diagnosis not present

## 2021-09-10 DIAGNOSIS — I161 Hypertensive emergency: Secondary | ICD-10-CM | POA: Diagnosis not present

## 2021-09-10 DIAGNOSIS — I214 Non-ST elevation (NSTEMI) myocardial infarction: Secondary | ICD-10-CM | POA: Diagnosis not present

## 2021-09-10 LAB — PROTIME-INR
INR: 1 (ref 0.8–1.2)
Prothrombin Time: 12.9 seconds (ref 11.4–15.2)

## 2021-09-10 LAB — LIPID PANEL
Cholesterol: 140 mg/dL (ref 0–200)
HDL: 34 mg/dL — ABNORMAL LOW (ref >40)
LDL Cholesterol: 50 mg/dL (ref 0–99)
Total CHOL/HDL Ratio: 4.1 RATIO
Triglycerides: 281 mg/dL — ABNORMAL HIGH (ref <150)
VLDL: 56 mg/dL — ABNORMAL HIGH (ref 0–40)

## 2021-09-10 LAB — BASIC METABOLIC PANEL
Anion gap: 9 (ref 5–15)
BUN: 29 mg/dL — ABNORMAL HIGH (ref 6–20)
CO2: 25 mmol/L (ref 22–32)
Calcium: 9.5 mg/dL (ref 8.9–10.3)
Chloride: 105 mmol/L (ref 98–111)
Creatinine, Ser: 1.43 mg/dL — ABNORMAL HIGH (ref 0.61–1.24)
GFR, Estimated: 59 mL/min — ABNORMAL LOW (ref >60)
Glucose, Bld: 97 mg/dL (ref 70–99)
Potassium: 4.2 mmol/L (ref 3.5–5.1)
Sodium: 139 mmol/L (ref 135–145)

## 2021-09-10 LAB — CBC
HCT: 41.4 % (ref 39.0–52.0)
Hemoglobin: 14.4 g/dL (ref 13.0–17.0)
MCH: 28.4 pg (ref 26.0–34.0)
MCHC: 34.8 g/dL (ref 30.0–36.0)
MCV: 81.7 fL (ref 80.0–100.0)
Platelets: 215 10*3/uL (ref 150–400)
RBC: 5.07 MIL/uL (ref 4.22–5.81)
RDW: 13.4 % (ref 11.5–15.5)
WBC: 8.6 10*3/uL (ref 4.0–10.5)
nRBC: 0 % (ref 0.0–0.2)

## 2021-09-10 LAB — MAGNESIUM: Magnesium: 2 mg/dL (ref 1.7–2.4)

## 2021-09-10 MED ORDER — LOPERAMIDE HCL 2 MG PO CAPS
2.0000 mg | ORAL_CAPSULE | ORAL | Status: DC | PRN
Start: 2021-09-10 — End: 2021-09-11
  Administered 2021-09-10: 2 mg via ORAL
  Filled 2021-09-10: qty 1

## 2021-09-10 MED ORDER — CLOPIDOGREL BISULFATE 75 MG PO TABS
75.0000 mg | ORAL_TABLET | Freq: Every day | ORAL | Status: DC
  Administered 2021-09-10 – 2021-09-11 (×2): 75 mg via ORAL
  Filled 2021-09-10 (×2): qty 1

## 2021-09-10 MED ORDER — PANTOPRAZOLE SODIUM 40 MG PO TBEC
40.0000 mg | DELAYED_RELEASE_TABLET | Freq: Every day | ORAL | Status: DC
  Administered 2021-09-10 – 2021-09-11 (×2): 40 mg via ORAL
  Filled 2021-09-10 (×2): qty 1

## 2021-09-10 NOTE — Progress Notes (Addendum)
? ?Progress Note ? ?Patient Name: Clarence [name] ?Date of Encounter: 09/10/2021 ? ?[facility] Cardiologist: Nelva Bush, MD  ? ?Subjective  ? ?Bps better this AM. Kidney function mildly elevated. Patient reports dull chest pain, may be acid reflux. He is off Nitro gtt.  ? ?Inpatient Medications  ?  ?Scheduled Meds: ? aspirin EC  81 mg Oral Daily  ? atorvastatin  80 mg Oral QPM  ? carvedilol  6.25 mg Oral BID WC  ? Chlorhexidine Gluconate Cloth  6 each Topical Daily  ? heparin injection (subcutaneous)  5,000 Units Subcutaneous Q8H  ? isosorbide-hydrALAZINE  1 tablet Oral TID  ? ?Continuous Infusions: ? nitroGLYCERIN Stopped (09/09/21 1445)  ? ?PRN Meds: ?acetaminophen, ALPRAZolam, guaiFENesin, hydrALAZINE, ipratropium-albuterol, loperamide, magnesium hydroxide, metoprolol tartrate, nitroGLYCERIN, ondansetron (ZOFRAN) IV, oxyCODONE, senna-docusate, traZODone  ? ?Vital Signs  ?  ?Vitals:  ? 09/10/21 0300 09/10/21 0400 09/10/21 0500 09/10/21 0600  ?BP:  116/82 116/79 123/84  ?Pulse: 91 85 73 79  ?Resp: (!) 24 (!) 23 19 12   ?Temp:  98.4 ?F (36.9 ?C)    ?TempSrc:  Oral    ?SpO2: 98% 97% 97% 98%  ?Weight:      ?Height:      ? ? ?Intake/Output Summary (Last 24 hours) at 09/10/2021 0755 ?Last data filed at 09/10/2021 0700 ?Gross per 24 hour  ?Intake 1113.35 ml  ?Output 2801 ml  ?Net -1687.65 ml  ? ? ?  09/09/2021  ?  6:52 AM 09/09/2021  ?  3:21 AM 08/29/2021  ?  2:33 PM  ?Last 3 Weights  ?Weight (lbs) 210 lb 5.1 oz 207 lb 235 lb 14.3 oz  ?Weight (kg) 95.4 kg 93.895 kg 107 kg  ?   ? ?Telemetry  ?  ?NSR, HR 70s, PVCs - Personally Reviewed ? ?ECG  ?  ?No new - Personally Reviewed ? ?Physical Exam  ? ?GEN: No acute distress.   ?Neck: No JVD ?Cardiac: RRR, no murmurs, rubs, or gallops.  ?Respiratory: Clear to auscultation bilaterally. ?GI: Soft, nontender, non-distended  ?MS: No edema; No deformity. ?Neuro:  Nonfocal  ?Psych: Normal affect  ? ?Labs  ?  ?High Sensitivity Troponin:   ?Recent Labs  ?Lab 09/09/21 ?0329  09/09/21 ?2703  ?TROPONINIHS 55* 139*  ?   ?Chemistry ?Recent Labs  ?Lab 09/09/21 ?0329 09/10/21 ?5009  ?NA 138 139  ?K 3.9 4.2  ?CL 106 105  ?CO2 26 25  ?GLUCOSE 111* 97  ?BUN 22* 29*  ?CREATININE 1.31* 1.43*  ?CALCIUM 9.4 9.5  ?MG  --  2.0  ?PROT 7.6  --   ?ALBUMIN 3.8  --   ?AST 41  --   ?ALT 53*  --   ?ALKPHOS 112  --   ?BILITOT 0.6  --   ?GFRNONAA >60 59*  ?ANIONGAP 6 9  ?  ?Lipids  ?Recent Labs  ?Lab 09/10/21 ?3818  ?CHOL 140  ?TRIG 281*  ?HDL 34*  ?[place] 50  ?CHOLHDL 4.1  ?  ?Hematology ?Recent Labs  ?Lab 09/09/21 ?0329 09/10/21 ?2993  ?WBC 10.1 8.6  ?RBC 5.62 5.07  ?HGB 15.7 14.4  ?HCT 46.1 41.4  ?MCV 82.0 81.7  ?MCH 27.9 28.4  ?MCHC 34.1 34.8  ?RDW 13.0 13.4  ?PLT 266 215  ? ?Thyroid No results for input(s): TSH, FREET4 in the last 168 hours.  ?BNP ?Recent Labs  ?Lab 09/09/21 ?0329  ?BNP 478.7*  ?  ?DDimer No results for input(s): DDIMER in the last 168 hours.  ? ?Radiology  ?  ?  DG Chest Portable 1 View ? ?Result Date: 09/09/2021 ?CLINICAL DATA:  Chest pain and shortness of breath EXAM: PORTABLE CHEST 1 VIEW COMPARISON:  08/04/2021 FINDINGS: Cardiomegaly and vascular pedicle widening. Congested appearance of vessels. No effusion or pneumothorax. Artifact from EKG leads and defibrillator pads. IMPRESSION: Cardiomegaly and mild vascular congestion. Electronically Signed   By: Jorje Guild M.D.   On: 09/09/2021 04:01   ? ?Cardiac Studies  ? ?R/L heart cath 07/2021 ?Conclusions: ?Diffuse moderate-severe, multivessel coronary artery disease, including chronic total occlusion of mid RCA, diffuse plaquing of LAD with up to 60% stenosis in the mid vessel, 60-70% proximal/mid LCx stenosis, and 90% lesion of OM1. ?Moderately elevated left heart filling pressures (LVEDP/PCWP 25 mmHg). ?Severely elevated right heart filling pressures (mean RA 16 mmHg, RVEDP 30 mmHg). ?Moderate-severe pulmonary hypertension (mean PAP 42 mmHg, PVR 4.0 WU). ?Moderately reduced Fick cardiac output/index (CO 4.2 L/min, CI 2.0  L/min/m^2).. ?  ?Recommendations: ?Suspect cardiomyopathy is a combination of ischemic and nonischemic etiologies confounded by significant pulmonary hypertension and right heart failure that may be due to underlying lung disease.  Recommend continued diuresis and optimization of GDMT for acute HFrEF.  In the setting of ongoing wheezing with concern for obstructive lung disease, I will hold carvedilol for now and add Entresto. ?Secondary prevention of coronary artery disease, including high-intensity statin therapy.  Diffuse nature of CAD is not ideal for percutaneous or surgical revascularization.  Mild troponin elevation on admission is most likely due to demand ischemia in the setting of acute HFrEF and chronic ischemic heart disease. ?  ?Nelva Bush, MD ?[facility] ? ?Coronary Diagrams ? ?Diagnostic ?Dominance: Right ? ? ?Echo 07/2021 ? 1. Left ventricular ejection fraction, by estimation, is 30 to 35%. The  ?left ventricle has moderately decreased function. The left ventricle  ?demonstrates global hypokinesis. There is moderate left ventricular  ?hypertrophy. Left ventricular diastolic  ?parameters are consistent with Grade II diastolic dysfunction  ?(pseudonormalization).  ? 2. Right ventricular systolic function is normal. The right ventricular  ?size is normal. Tricuspid regurgitation signal is inadequate for assessing  ?PA pressure.  ? 3. Left atrial size was mildly dilated.  ? 4. The mitral valve is abnormal. Mild to moderate mitral valve  ?regurgitation. No evidence of mitral stenosis.  ? 5. The aortic valve has an indeterminant number of cusps. Aortic valve  ?regurgitation is not visualized. No aortic stenosis is present.  ? 6. The inferior vena cava is dilated in size with <50% respiratory  ?variability, suggesting right atrial pressure of 15 mmHg.  ? ?Patient Profile  ?   ?51 y.o. male with a hx of coronary artery disease and chronic systolic heart failure who is being seen 09/09/2021 for the  evaluation of chest pain and possible inferior STEMI  ? ?Assessment & Plan  ?  ?Hypertensive Urgency ?- presented with chest pain, and canceled code STEMI due to known CAD ?- CHF/antihypertensives medications were recently discontinued due to worsening renal function ?- PTA Farxiga ?- Nitro drip>now off ?- Coreg 6.13mBID ?- Bidil 20-37.49mID ?- Bps better this AM ? ?Unstable Angina ?- presented as canceled CODE STEMI. HE has known CTO RCA. Suspected EKG changes were demand ischemia in the setting of elevated blood pressure ?- R/L heart cath 07/2021 report above ?- continue Aspirin, Coreg 6.2557mD, Lipitor 15m64mily, Bidil 20-37.5mg 74m ?- reports dull chest pain, possible from GERD, we will add PPI ? ?Acute on chronic systolic heart failure ?Mixed ICM/NICM ?- Echo 07/2021 showed LVEF 30-35% ?-  in the OP setting CHF/antihypertensives medications were recently discontinued due to worsening renal function ?- PTA Farxiga ?- s/p IV lasix 74m x 1 ?- kidney function up this AM, hold further diruesis ?- can likely start oral PTA lasix tomorrow. Further GDMT can be addressed as OP ?- daily BMET ? ?CKD stage 2 ?- suspect worsening renal function from introduction of Entresto, spironolactone and FIran  ?- Farxiga held ?- on admission Scr 1.31, BUN 22 ?- daily BMET ? ?For questions or updates, please contact [facility]?Please consult [email or web address] for contact info under  ? ?  ?   ?Signed, ?Zniyah Midkiff HNinfa Meeker PA-C  ?09/10/2021, 7:55 AM    ?

## 2021-09-10 NOTE — Progress Notes (Addendum)
? ? ? ?Progress Note  ? ? ?Clarence [name]  VCB:[id number] DOB: June 25, 1970  DOA: 09/09/2021 ?PCP: No primary care provider on file.  ? ? ? ? ?Brief Narrative:  ? ? ?Medical records reviewed and are as summarized below: ? ?Clarence [name] is a 51 y.o. male with medical history significant for systolic and diastolic CHF, stage IIIa chronic kidney disease, Crohn's disease, hypertension, presented to the hospital with sudden onset of midsternal chest pain (described as tightness and squeezing), shortness of breath, diaphoresis and dizziness.  He said he was previously on Lasix but he was advised to discontinue Lasix because of worsening kidney function. ? ?He was admitted to the hospital for hypertensive emergency and acute exacerbation of chronic systolic and diastolic CHF.  He was treated with IV Lasix and antihypertensives including nitroglycerin drip.  NSTEMI was initially suspected so he was treated with IV heparin drip.  However, NSTEMI was ruled out.  Elevated troponins were attributed to demand ischemia. ? ? ? ? ? ? ?Assessment/Plan:  ? ?Principal Problem: ?  Acute on chronic combined systolic and diastolic CHF (congestive heart failure) ([place]) ?Active Problems: ?  Hypertensive emergency ?  CKD (chronic kidney disease) stage 2, GFR 60-89 ml/min ?  Dyslipidemia ? ? ?Body mass index is 30.18 kg/m?.  (Obesity) ? ?Hypertensive emergency: BP has improved.  He is off IV nitroglycerin drip.  He has been started on carvedilol and BiDil (isosorbide mononitrate and hydralazine combination). ? ?Acute on chronic systolic and diastolic CHF, ischemic cardiomyopathy: S/p treatment with IV Lasix. ? ?CAD, chest pain, elevated troponins: Elevated troponins were attributed to demand ischemia.  Recent left heart cath showed CTO RCA,  90% OM1, 60% mid LAD.  He has been started on Plavix.  Continue aspirin and Lipitor. ? ?CKD stage IIIa: Creatinine is stable.  Of note, he had recent AKI and creatinine was 1.73 on 09/02/2021 (prior to  admission) ? ? ? ?Diet Order   ? ?       ?  Diet heart healthy/carb modified Room service appropriate? Yes; Fluid consistency: Thin  Diet effective now       ?  ? ?  ?  ? ?  ? ? ? ? ? ? ? ? ? ? ? ?Consultants: ?Cardiologist ? ?Procedures: ?None ? ? ? ?Medications:  ? ? aspirin EC  81 mg Oral Daily  ? atorvastatin  80 mg Oral QPM  ? carvedilol  6.25 mg Oral BID WC  ? Chlorhexidine Gluconate Cloth  6 each Topical Daily  ? clopidogrel  75 mg Oral Daily  ? heparin injection (subcutaneous)  5,000 Units Subcutaneous Q8H  ? isosorbide-hydrALAZINE  1 tablet Oral TID  ? pantoprazole  40 mg Oral Daily  ? ?Continuous Infusions: ? nitroGLYCERIN Stopped (09/09/21 1445)  ? ? ? ?Anti-infectives (From admission, onward)  ? ? None  ? ?  ? ? ? ? ? ? ? ? ? ?Family Communication/Anticipated D/C date and plan/Code Status  ? ?DVT prophylaxis: heparin injection 5,000 Units Start: 09/09/21 0945 ? ? ?  Code Status: Full Code ? ?Family Communication: None ?Disposition Plan: Plan to discharge home tomorrow ? ? ?Status is: Inpatient ?Remains inpatient appropriate because: Monitor vital signs no new medications ? ? ? ? ? ? ?Subjective:  ? ?Interval events noted.  Breathing is a little better today.  No chest pain or shortness of breath. ? ?Objective:  ? ? ?Vitals:  ? 09/10/21 1000 09/10/21 1100 09/10/21 1200 09/10/21 1300  ?BP: 106/65  105/69 115/86 120/79  ?Pulse: 79 86 82 71  ?Resp: (!) 23 (!) 24 18 20   ?Temp:   98.9 ?F (37.2 ?C)   ?TempSrc:   Oral   ?SpO2: 97% 97% 98% 96%  ?Weight:      ?Height:      ? ?No data found. ? ? ?Intake/Output Summary (Last 24 hours) at 09/10/2021 1352 ?Last data filed at 09/10/2021 1306 ?Gross per 24 hour  ?Intake 575.77 ml  ?Output 1651 ml  ?Net -1075.23 ml  ? ?Filed Weights  ? 09/09/21 0321 09/09/21 8003  ?Weight: 93.9 kg 95.4 kg  ? ? ?Exam: ? ?GEN: NAD ?SKIN: No rash ?EYES: EOMI ?ENT: MMM ?CV: RRR ?PULM: CTA B ?ABD: soft, ND, NT, +BS ?CNS: AAO x 3, non focal ?EXT: No edema or tenderness ? ? ? ?  ? ? ?Data  Reviewed:  ? ?I have personally reviewed following labs and imaging studies: ? ?Labs: ?Labs show the following:  ? ?Basic Metabolic Panel: ?Recent Labs  ?Lab 09/09/21 ?0329 09/10/21 ?4917  ?NA 138 139  ?K 3.9 4.2  ?CL 106 105  ?CO2 26 25  ?GLUCOSE 111* 97  ?BUN 22* 29*  ?CREATININE 1.31* 1.43*  ?CALCIUM 9.4 9.5  ?MG  --  2.0  ? ?GFR ?Estimated Creatinine Clearance: 70.1 mL/min (A) (by C-G formula based on SCr of 1.43 mg/dL (H)). ?Liver Function Tests: ?Recent Labs  ?Lab 09/09/21 ?0329  ?AST 41  ?ALT 53*  ?ALKPHOS 112  ?BILITOT 0.6  ?PROT 7.6  ?ALBUMIN 3.8  ? ?No results for input(s): LIPASE, AMYLASE in the last 168 hours. ?No results for input(s): AMMONIA in the last 168 hours. ?Coagulation profile ?Recent Labs  ?Lab 09/09/21 ?9150 09/10/21 ?0444  ?INR 1.0 1.0  ? ? ?CBC: ?Recent Labs  ?Lab 09/09/21 ?0329 09/10/21 ?5697  ?WBC 10.1 8.6  ?NEUTROABS 6.0  --   ?HGB 15.7 14.4  ?HCT 46.1 41.4  ?MCV 82.0 81.7  ?PLT 266 215  ? ?Cardiac Enzymes: ?No results for input(s): CKTOTAL, CKMB, CKMBINDEX, TROPONINI in the last 168 hours. ?BNP (last 3 results) ?No results for input(s): PROBNP in the last 8760 hours. ?CBG: ?Recent Labs  ?Lab 09/09/21 ?9480  ?GLUCAP 109*  ? ?D-Dimer: ?No results for input(s): DDIMER in the last 72 hours. ?Hgb A1c: ?No results for input(s): HGBA1C in the last 72 hours. ?Lipid Profile: ?Recent Labs  ?  09/09/21 ?0329 09/10/21 ?0444  ?CHOL 174 140  ?HDL 35* 34*  ?LDLCALC UNABLE TO CALCULATE IF TRIGLYCERIDE OVER 400 mg/dL 50  ?TRIG 520* 281*  ?CHOLHDL 5.0 4.1  ?LDLDIRECT 69.0  --   ? ?Thyroid function studies: ?No results for input(s): TSH, T4TOTAL, T3FREE, THYROIDAB in the last 72 hours. ? ?Invalid input(s): FREET3 ?Anemia work up: ?No results for input(s): VITAMINB12, FOLATE, FERRITIN, TIBC, IRON, RETICCTPCT in the last 72 hours. ?Sepsis Labs: ?Recent Labs  ?Lab 09/09/21 ?0329 09/10/21 ?1655  ?WBC 10.1 8.6  ? ? ?Microbiology ?No results found for this or any previous visit (from the past 240  hour(s)). ? ?Procedures and diagnostic studies: ? ?DG Chest Portable 1 View ? ?Result Date: 09/09/2021 ?CLINICAL DATA:  Chest pain and shortness of breath EXAM: PORTABLE CHEST 1 VIEW COMPARISON:  08/04/2021 FINDINGS: Cardiomegaly and vascular pedicle widening. Congested appearance of vessels. No effusion or pneumothorax. Artifact from EKG leads and defibrillator pads. IMPRESSION: Cardiomegaly and mild vascular congestion. Electronically Signed   By: Jorje Guild M.D.   On: 09/09/2021 04:01   ? ? ? ? ? ? ? ? ? ? ? ?  LOS: 1 day  ? ?Verdell Kincannon  ?[facility]  ? ?Pager on [email or web address]. If 7PM-7AM, please contact night-coverage at [email or web address] ? ? ? ? ?09/10/2021, 1:52 PM  ? ? ? ? ? ? ? ? ? ?

## 2021-09-10 NOTE — Consult Note (Signed)
? ? ?  Heart Failure Nurse Navigator Note ? ?HFrEF 30-35%.  Moderate LVH.  Grade II diastolic dysfunction.  Mild to moderate mitral regurgitation. ? ?He presented to the ED with complaints of chest tightness, dyspnea, diaphoresis and dizziness.  Blood pressure on arrival was 207/156. ? ?Comorbidities: ? ?Chronic kidney disease stage III ?Crohn's disease ?Hypertension ?Moderate to severe coronary artery disease ? ?Medications: ?Aspirin 81 mg daily ?Atorvastatin 80 mg every evening ?Carvedilol 6.25 mg twice a day with meals ?Plavix 75 mg daily ?Isosorbide-hydralazine 20/37.5 mg 1 tablet 3 times a day ? ?Furosemide is on hold due to worsening kidney function.  Along with [place]. ? ?Labs: ? ?Sodium 139, potassium 4.2, chloride 105, CO2 25, BUN 29, creatinine 1.43 from 1.31 of yesterday. ? ?Patient is single lives at home with his mother and uncle. ? ? ?Initial meeting with patient today, in the ICU.  He was sitting up in bed in no acute distress. ? ?He states prior to admission he was only taking two medications due to his kidney function. ? ?States that he was weighing himself daily and he would see it fluctuate up 2 pounds and then the next day would be back down 2 pounds.  He attributed it to what he was eating.  Discussed  fluid restriction of 64 ounces he voices concerns over being dehydrated with that amount. ? ?He states that is mother is very upset- not understanding what has gone on with him.  He is wanting his uncle to come in and talk with the doctors and then explain it to his mother. ? ?Will continue to follow. ? ?Pricilla Riffle RN CHFN ?

## 2021-09-10 NOTE — Progress Notes (Signed)
?  Transition of Care (TOC) Screening Note ? ? ?Patient Details  ?Name: Clarence [name] ?Date of Birth: 1970/12/19 ? ? ?Transition of Care (TOC) CM/SW Contact:    ?Shelbie Hutching, RN ?Phone Number: ?09/10/2021, 11:21 AM ? ? ? ?Transition of Care Department [facility]) has reviewed patient and no TOC needs have been identified at this time. We will continue to monitor patient advancement through interdisciplinary progression rounds. If new patient transition needs arise, please place a TOC consult. ?  ?

## 2021-09-11 DIAGNOSIS — I25118 Atherosclerotic heart disease of native coronary artery with other forms of angina pectoris: Secondary | ICD-10-CM | POA: Diagnosis not present

## 2021-09-11 DIAGNOSIS — I420 Dilated cardiomyopathy: Secondary | ICD-10-CM

## 2021-09-11 DIAGNOSIS — N182 Chronic kidney disease, stage 2 (mild): Secondary | ICD-10-CM | POA: Diagnosis not present

## 2021-09-11 DIAGNOSIS — I248 Other forms of acute ischemic heart disease: Secondary | ICD-10-CM | POA: Diagnosis not present

## 2021-09-11 DIAGNOSIS — I161 Hypertensive emergency: Secondary | ICD-10-CM | POA: Diagnosis not present

## 2021-09-11 DIAGNOSIS — I5043 Acute on chronic combined systolic (congestive) and diastolic (congestive) heart failure: Secondary | ICD-10-CM | POA: Diagnosis not present

## 2021-09-11 LAB — CREATININE, SERUM
Creatinine, Ser: 1.32 mg/dL — ABNORMAL HIGH (ref 0.61–1.24)
GFR, Estimated: >60 mL/min (ref >60)

## 2021-09-11 LAB — MAGNESIUM: Magnesium: 2 mg/dL (ref 1.7–2.4)

## 2021-09-11 MED ORDER — CLOPIDOGREL BISULFATE 75 MG PO TABS: 75.0000 mg | ORAL_TABLET | Freq: Every day | ORAL | 0 refills | Status: DC

## 2021-09-11 MED ORDER — DAPAGLIFLOZIN PROPANEDIOL 10 MG PO TABS
10.0000 mg | ORAL_TABLET | Freq: Every day | ORAL | 0 refills | Status: DC
Start: 2021-09-11 — End: 2021-09-22

## 2021-09-11 MED ORDER — ASPIRIN 81 MG PO TBEC: 81.0000 mg | DELAYED_RELEASE_TABLET | Freq: Every day | ORAL | Status: AC

## 2021-09-11 MED ORDER — FUROSEMIDE 40 MG PO TABS: 20.0000 mg | ORAL_TABLET | ORAL | Status: DC

## 2021-09-11 MED ORDER — ISOSORB DINITRATE-HYDRALAZINE 20-37.5 MG PO TABS: 1.0000 | ORAL_TABLET | Freq: Three times a day (TID) | ORAL | 0 refills | Status: DC

## 2021-09-11 MED ORDER — CARVEDILOL 6.25 MG PO TABS: 6.2500 mg | ORAL_TABLET | Freq: Two times a day (BID) | ORAL | 0 refills | Status: DC

## 2021-09-11 MED ORDER — LOSARTAN POTASSIUM 25 MG PO TABS
25.0000 mg | ORAL_TABLET | Freq: Every day | ORAL | 0 refills | Status: DC
Start: 2021-09-11 — End: 2021-09-15

## 2021-09-11 NOTE — Discharge Summary (Signed)
?Physician Discharge Summary ?  ?Patient: Clarence [name] MRN: [id number] DOB: 01-Apr-1971  ?Admit date:     09/09/2021  ?Discharge date: 09/11/21  ?Discharge Physician: Jennye Boroughs  ? ?PCP: No primary care provider on file.  ? ?Recommendations at discharge:  ? ? ?Follow-up with Dr. Saunders Revel, cardiologist, in 1 to 2 weeks ? ?Discharge Diagnoses: ?Principal Problem: ?  Acute on chronic combined systolic and diastolic CHF (congestive heart failure) ([place]) ?Active Problems: ?  Hypertensive emergency ?  CKD (chronic kidney disease) stage 2, GFR 60-89 ml/min ?  Dyslipidemia ? ?Resolved Problems: ?  * No resolved hospital problems. * ? ?Hospital Course: ? ?SAMUELE [name] is a 51 y.o. male with medical history significant for systolic and diastolic CHF, stage IIIa chronic kidney disease, Crohn's disease, hypertension, presented to the hospital with sudden onset of midsternal chest pain (described as tightness and squeezing), shortness of breath, diaphoresis and dizziness.  He said he was previously on Lasix but he was advised to discontinue Lasix because of worsening kidney function. ?  ?He was admitted to the hospital for hypertensive emergency and acute exacerbation of chronic systolic and diastolic CHF.  He was treated with IV Lasix and antihypertensives including nitroglycerin drip.  NSTEMI was initially suspected so he was treated with IV heparin drip.  However, NSTEMI was ruled out.  Elevated troponins were attributed to demand ischemia.  His condition has improved significantly.  Blood pressure has improved as well.  He is deemed stable for discharge to home today.  Discharge plan, including medication changes, was discussed with the patient in detail.  Case was also discussed with Dr. Rockey Situ, cardiologist. ?  ?  ? ?  ? ? ?Consultants: Cardiologist ?Procedures performed: None ?Disposition: Home ?Diet recommendation:  ?Discharge Diet Orders (From admission, onward)  ? ?  Start     Ordered  ? 09/11/21 0000  Diet - low  sodium heart healthy       ? 09/11/21 1220  ? ?  ?  ? ?  ? ?Cardiac diet ?DISCHARGE MEDICATION: ?Allergies as of 09/11/2021   ? ?   Reactions  ? Ibuprofen Other (See Comments), Hives  ? Other reaction(s): GI Upset (intolerance) ?Hives  ? Neomycin-bacitracin Zn-polymyx   ? Other reaction(s): Other (See Comments)  ? Sulfacetamide Other (See Comments)  ? ?  ? ?  ?Medication List  ?  ? ?STOP taking these medications   ? ?Entresto 24-26 MG ?Generic drug: sacubitril-valsartan ?  ? ?  ? ?TAKE these medications   ? ?aspirin 81 MG EC tablet ?Take 1 tablet (81 mg total) by mouth daily. Swallow whole. ?  ?atorvastatin 80 MG tablet ?Commonly known as: LIPITOR ?Take 1 tablet (80 mg total) by mouth every evening. ?  ?carvedilol 6.25 MG tablet ?Commonly known as: COREG ?Take 1 tablet (6.25 mg total) by mouth 2 (two) times daily with a meal. ?  ?clopidogrel 75 MG tablet ?Commonly known as: PLAVIX ?Take 1 tablet (75 mg total) by mouth daily. ?Start taking on: September 12, 2021 ?  ?dapagliflozin propanediol 10 MG Tabs tablet ?Commonly known as: FARXIGA ?Take 1 tablet (10 mg total) by mouth daily. ?  ?furosemide 40 MG tablet ?Commonly known as: LASIX ?Take 0.5 tablets (20 mg total) by mouth every other day. ?What changed:  ?how much to take ?when to take this ?  ?isosorbide-hydrALAZINE 20-37.5 MG tablet ?Commonly known as: BIDIL ?Take 1 tablet by mouth 3 (three) times daily. ?  ?losartan 25 MG tablet ?Commonly known  as: Cozaar ?Take 1 tablet (25 mg total) by mouth daily. ?  ?spironolactone 25 MG tablet ?Commonly known as: ALDACTONE ?Take 0.5 tablets (12.5 mg total) by mouth daily. ?  ? ?  ? ? ?Discharge Exam: ?Filed Weights  ? 09/09/21 0321 09/09/21 2330  ?Weight: 93.9 kg 95.4 kg  ? ?GEN: NAD ?SKIN: No rash ?EYES: EOMI ?ENT: MMM ?CV: RRR ?PULM: CTA B ?ABD: soft, ND, NT, +BS ?CNS: AAO x 3, non focal ?EXT: No edema or tenderness ? ? ?Condition at discharge: good ? ?The results of significant diagnostics from this hospitalization (including  imaging, microbiology, ancillary and laboratory) are listed below for reference.  ? ?Imaging Studies: ?US Renal ? ?Result Date: 08/29/2021 ?CLINICAL DATA:  Acute kidney injury. EXAM: RENAL / URINARY TRACT ULTRASOUND COMPLETE COMPARISON:  CT abdomen and pelvis without contrast 02/06/2020 FINDINGS: Right Kidney: Renal measurements: 10.6 x 5.8 x 6.4 cm = volume: 205 mL. Echogenicity within normal limits. No mass or hydronephrosis visualized. Left Kidney: Renal measurements: 11.6 x 4.6 x 5.3 cm = volume: 145 mL. Echogenicity within normal limits. No mass or hydronephrosis visualized. Bladder: Urinary bladder is only minimally distended, limiting evaluation. No gross abnormality is seen. Other: None. IMPRESSION: Normal appearance of the bilateral kidneys. Electronically Signed   By: Yvonne Kendall M.D.   On: 08/29/2021 16:37  ? ?DG Chest Portable 1 View ? ?Result Date: 09/09/2021 ?CLINICAL DATA:  Chest pain and shortness of breath EXAM: PORTABLE CHEST 1 VIEW COMPARISON:  08/04/2021 FINDINGS: Cardiomegaly and vascular pedicle widening. Congested appearance of vessels. No effusion or pneumothorax. Artifact from EKG leads and defibrillator pads. IMPRESSION: Cardiomegaly and mild vascular congestion. Electronically Signed   By: Jorje Guild M.D.   On: 09/09/2021 04:01   ? ?Microbiology: ?Results for orders placed or performed during the hospital encounter of 08/29/21  ?Urine Culture     Status: Abnormal  ? Collection Time: 08/29/21  2:56 PM  ? Specimen: Urine, Clean Catch  ?Result Value Ref Range Status  ? Specimen Description   Final  ?  URINE, CLEAN CATCH ?Performed at [facility], [address], [place], [place] [address] ?  ? Special Requests   Final  ?  NONE ?Performed at [facility], [address] [place], [place] [address] ?  ? Culture (A)  Final  ?  <10,000 COLONIES/mL INSIGNIFICANT GROWTH ?Performed at [facility], [place] [address] [place], [place] [address] ?  ? Report Status  08/31/2021 FINAL  Final  ? ? ?Labs: ?CBC: ?Recent Labs  ?Lab 09/09/21 ?0329 09/10/21 ?6389  ?WBC 10.1 8.6  ?NEUTROABS 6.0  --   ?HGB 15.7 14.4  ?HCT 46.1 41.4  ?MCV 82.0 81.7  ?PLT 266 215  ? ?Basic Metabolic Panel: ?Recent Labs  ?Lab 09/09/21 ?0329 09/10/21 ?0444 09/11/21 ?0602  ?NA 138 139  --   ?K 3.9 4.2  --   ?CL 106 105  --   ?CO2 26 25  --   ?GLUCOSE 111* 97  --   ?BUN 22* 29*  --   ?CREATININE 1.31* 1.43* 1.32*  ?CALCIUM 9.4 9.5  --   ?MG  --  2.0 2.0  ? ?Liver Function Tests: ?Recent Labs  ?Lab 09/09/21 ?0329  ?AST 41  ?ALT 53*  ?ALKPHOS 112  ?BILITOT 0.6  ?PROT 7.6  ?ALBUMIN 3.8  ? ?CBG: ?Recent Labs  ?Lab 09/09/21 ?3734  ?GLUCAP 109*  ? ? ?Discharge time spent: greater than 30 minutes. ? ?Signed: ?Jennye Boroughs, MD ?[facility] ?09/11/2021 ?

## 2021-09-11 NOTE — Progress Notes (Signed)
? ?Progress Note ? ?Patient Name: Clarence [name] ?Date of Encounter: 09/11/2021 ? ?[facility] Cardiologist: Nelva Bush, MD  ? ?Subjective  ? ?Reports feeling well, no chest pain or shortness of breath on exertion ?Ambulated in the halls yesterday with no significant symptoms ?Blood pressures improving ? ?Inpatient Medications  ?  ?Scheduled Meds: ? aspirin EC  81 mg Oral Daily  ? atorvastatin  80 mg Oral QPM  ? carvedilol  6.25 mg Oral BID WC  ? Chlorhexidine Gluconate Cloth  6 each Topical Daily  ? clopidogrel  75 mg Oral Daily  ? heparin injection (subcutaneous)  5,000 Units Subcutaneous Q8H  ? isosorbide-hydrALAZINE  1 tablet Oral TID  ? pantoprazole  40 mg Oral Daily  ? ?Continuous Infusions: ? ?PRN Meds: ?acetaminophen, ALPRAZolam, guaiFENesin, hydrALAZINE, ipratropium-albuterol, loperamide, magnesium hydroxide, metoprolol tartrate, nitroGLYCERIN, ondansetron (ZOFRAN) IV, oxyCODONE, senna-docusate, traZODone  ? ?Vital Signs  ?  ?Vitals:  ? 09/10/21 2100 09/10/21 2153 09/11/21 3536 09/11/21 0844  ?BP: 109/75 117/79 127/89 (!) 148/96  ?Pulse: 71 76 77 81  ?Resp: 19 16 18 16   ?Temp:  97.9 ?F (36.6 ?C) 97.9 ?F (36.6 ?C) 98.6 ?F (37 ?C)  ?TempSrc:  Oral Oral   ?SpO2: 97% 97% 96% 97%  ?Weight:      ?Height:      ? ? ?Intake/Output Summary (Last 24 hours) at 09/11/2021 1133 ?Last data filed at 09/11/2021 [phone] ?Gross per 24 hour  ?Intake 720 ml  ?Output 1070 ml  ?Net -350 ml  ? ? ?  09/09/2021  ?  6:52 AM 09/09/2021  ?  3:21 AM 08/29/2021  ?  2:33 PM  ?Last 3 Weights  ?Weight (lbs) 210 lb 5.1 oz 207 lb 235 lb 14.3 oz  ?Weight (kg) 95.4 kg 93.895 kg 107 kg  ?   ? ?Telemetry  ?  ?Normal sinus rhythm- Personally Reviewed ? ?ECG  ?  ? - Personally Reviewed ? ?Physical Exam  ? ?GEN: No acute distress.   ?Neck: No JVD ?Cardiac: RRR, no murmurs, rubs, or gallops.  ?Respiratory: Clear to auscultation bilaterally. ?GI: Soft, nontender, non-distended  ?MS: No edema; No deformity. ?Neuro:  Nonfocal  ?Psych: Normal affect   ? ?Labs  ?  ?High Sensitivity Troponin:   ?Recent Labs  ?Lab 09/09/21 ?0329 09/09/21 ?1540  ?TROPONINIHS 55* 139*  ?   ?Chemistry ?Recent Labs  ?Lab 09/09/21 ?0329 09/10/21 ?0444 09/11/21 ?0602  ?NA 138 139  --   ?K 3.9 4.2  --   ?CL 106 105  --   ?CO2 26 25  --   ?GLUCOSE 111* 97  --   ?BUN 22* 29*  --   ?CREATININE 1.31* 1.43* 1.32*  ?CALCIUM 9.4 9.5  --   ?MG  --  2.0 2.0  ?PROT 7.6  --   --   ?ALBUMIN 3.8  --   --   ?AST 41  --   --   ?ALT 53*  --   --   ?ALKPHOS 112  --   --   ?BILITOT 0.6  --   --   ?GFRNONAA >60 59* >60  ?ANIONGAP 6 9  --   ?  ?Lipids  ?Recent Labs  ?Lab 09/10/21 ?0867  ?CHOL 140  ?TRIG 281*  ?HDL 34*  ?[place] 50  ?CHOLHDL 4.1  ?  ?Hematology ?Recent Labs  ?Lab 09/09/21 ?0329 09/10/21 ?6195  ?WBC 10.1 8.6  ?RBC 5.62 5.07  ?HGB 15.7 14.4  ?HCT 46.1 41.4  ?MCV 82.0 81.7  ?  MCH 27.9 28.4  ?MCHC 34.1 34.8  ?RDW 13.0 13.4  ?PLT 266 215  ? ?Thyroid No results for input(s): TSH, FREET4 in the last 168 hours.  ?BNP ?Recent Labs  ?Lab 09/09/21 ?0329  ?BNP 478.7*  ?  ?DDimer No results for input(s): DDIMER in the last 168 hours.  ? ?Radiology  ?  ?No results found. ? ?Cardiac Studies  ? ? ? ?Patient Profile  ?   ?51 year old male with history of CAD (LHC 07/2021-CTO RCA, 90% OM1, 60% mid LAD), hypertension, former smoker, CKD 2 who presents due to chest pain, being seen for NSTEMI. ?Minimal troponin elevation consider demand ischemia in the setting of CAD.   ? ?Assessment & Plan  ?  ?1.  Chest pain, CAD   ?LHC last month showing CTO RCA, 90% OM1, 60% mid LAD ?Denies chest pain on exertion ?Recommended continue aspirin, Plavix, Lipitor ?Unable to exclude hypertensive heart disease causing his angina ?Notes indicating history of medication noncompliance ?Stressed with him the importance of taking his medications ?  ?2.  Ischemic cardiomyopathy ?Echocardiogram August 05, 2021 EF 30 to 35% ?Medication compliance recommended ?-Coreg 6.25 mg twice daily, BiDil 1 tab 3 times daily, Farxiga. ?Add losartan 25  daily, continue spironolactone, Lasix and potassium every other day ?  ?3.  Hypertension ?Presenting with markedly elevated pressure and chest pain/angina ?Blood pressure improved on his medications ?Stressed the importance of medication compliance ? ? ?Case discussed with hospitalist service, we will arrange follow-up with Dr. Saunders Revel ? Total encounter time more than 50 minutes ? Greater than 50% was spent in counseling and coordination of care with the patient ? ? ?For questions or updates, please contact [place] ?Please consult [email or web address] for contact info under  ? ?  ?   ?Signed, ?Ida Rogue, MD  ?09/11/2021, 11:33 AM    ?

## 2021-09-11 NOTE — Progress Notes (Signed)
? ?  Heart Failure Nurse Navigator Note ? ?With patient today, sitting on the edge of the bed.  When questioned he does not feel that he is quite back to his baseline. ? ?Discussed being compliant with daily weights and blood pressure readings.  Suggested to keep a diary take to his doctors appointments and also to his appointments at the heart failure clinic. ? ?Also talked about remaining active.  To listen to his body and when he is short of breath or fatigued to sit down and rest.  He questions if he could ride a stationary bike, I asked that he discuss that with the cardiologist. ? ?He had no further questions. ? ?Pricilla Riffle RN CHFN ?

## 2021-09-12 ENCOUNTER — Ambulatory Visit: Payer: Managed Care, Other (non HMO) | Admitting: Nurse Practitioner

## 2021-09-14 ENCOUNTER — Other Ambulatory Visit: Payer: Self-pay

## 2021-09-14 ENCOUNTER — Emergency Department (HOSPITAL_COMMUNITY): Payer: Managed Care, Other (non HMO)

## 2021-09-14 ENCOUNTER — Encounter (HOSPITAL_COMMUNITY): Payer: Self-pay | Admitting: Emergency Medicine

## 2021-09-14 ENCOUNTER — Observation Stay (HOSPITAL_COMMUNITY)
Admission: EM | Admit: 2021-09-14 | Discharge: 2021-09-15 | Disposition: A | Discharge status: Home / Self Care | Payer: Managed Care, Other (non HMO) | Attending: Internal Medicine | Admitting: Internal Medicine

## 2021-09-14 DIAGNOSIS — N182 Chronic kidney disease, stage 2 (mild): Secondary | ICD-10-CM

## 2021-09-14 DIAGNOSIS — I509 Heart failure, unspecified: Secondary | ICD-10-CM | POA: Insufficient documentation

## 2021-09-14 DIAGNOSIS — N189 Chronic kidney disease, unspecified: Secondary | ICD-10-CM | POA: Diagnosis not present

## 2021-09-14 DIAGNOSIS — Z87891 Personal history of nicotine dependence: Secondary | ICD-10-CM | POA: Insufficient documentation

## 2021-09-14 DIAGNOSIS — I161 Hypertensive emergency: Secondary | ICD-10-CM

## 2021-09-14 DIAGNOSIS — Z7902 Long term (current) use of antithrombotics/antiplatelets: Secondary | ICD-10-CM | POA: Insufficient documentation

## 2021-09-14 DIAGNOSIS — T68XXXA Hypothermia, initial encounter: Secondary | ICD-10-CM

## 2021-09-14 DIAGNOSIS — R55 Syncope and collapse: Secondary | ICD-10-CM | POA: Diagnosis not present

## 2021-09-14 DIAGNOSIS — I952 Hypotension due to drugs: Secondary | ICD-10-CM | POA: Diagnosis not present

## 2021-09-14 DIAGNOSIS — Z7982 Long term (current) use of aspirin: Secondary | ICD-10-CM | POA: Insufficient documentation

## 2021-09-14 DIAGNOSIS — Z79899 Other long term (current) drug therapy: Secondary | ICD-10-CM | POA: Diagnosis not present

## 2021-09-14 DIAGNOSIS — R0602 Shortness of breath: Secondary | ICD-10-CM | POA: Insufficient documentation

## 2021-09-14 DIAGNOSIS — R531 Weakness: Secondary | ICD-10-CM

## 2021-09-14 DIAGNOSIS — R680 Hypothermia, not associated with low environmental temperature: Secondary | ICD-10-CM | POA: Diagnosis not present

## 2021-09-14 DIAGNOSIS — I130 Hypertensive heart and chronic kidney disease with heart failure and stage 1 through stage 4 chronic kidney disease, or unspecified chronic kidney disease: Secondary | ICD-10-CM | POA: Insufficient documentation

## 2021-09-14 DIAGNOSIS — I5042 Chronic combined systolic (congestive) and diastolic (congestive) heart failure: Secondary | ICD-10-CM | POA: Insufficient documentation

## 2021-09-14 DIAGNOSIS — I10 Essential (primary) hypertension: Secondary | ICD-10-CM | POA: Diagnosis present

## 2021-09-14 DIAGNOSIS — I5043 Acute on chronic combined systolic (congestive) and diastolic (congestive) heart failure: Secondary | ICD-10-CM | POA: Diagnosis present

## 2021-09-14 DIAGNOSIS — K50918 Crohn's disease, unspecified, with other complication: Secondary | ICD-10-CM

## 2021-09-14 DIAGNOSIS — I959 Hypotension, unspecified: Secondary | ICD-10-CM

## 2021-09-14 DIAGNOSIS — E785 Hyperlipidemia, unspecified: Secondary | ICD-10-CM

## 2021-09-14 LAB — CBC WITH DIFFERENTIAL/PLATELET
Abs Immature Granulocytes: 0.02 10*3/uL (ref 0.00–0.07)
Basophils Absolute: 0 10*3/uL (ref 0.0–0.1)
Basophils Relative: 0 %
Eosinophils Absolute: 0.2 10*3/uL (ref 0.0–0.5)
Eosinophils Relative: 2 %
HCT: 42.7 % (ref 39.0–52.0)
Hemoglobin: 14.3 g/dL (ref 13.0–17.0)
Immature Granulocytes: 0 %
Lymphocytes Relative: 18 %
Lymphs Abs: 1.3 10*3/uL (ref 0.7–4.0)
MCH: 28.5 pg (ref 26.0–34.0)
MCHC: 33.5 g/dL (ref 30.0–36.0)
MCV: 85.2 fL (ref 80.0–100.0)
Monocytes Absolute: 0.6 10*3/uL (ref 0.1–1.0)
Monocytes Relative: 8 %
Neutro Abs: 5.1 10*3/uL (ref 1.7–7.7)
Neutrophils Relative %: 72 %
Platelets: 189 10*3/uL (ref 150–400)
RBC: 5.01 MIL/uL (ref 4.22–5.81)
RDW: 14.1 % (ref 11.5–15.5)
WBC: 7.1 10*3/uL (ref 4.0–10.5)
nRBC: 0 % (ref 0.0–0.2)

## 2021-09-14 LAB — URINALYSIS, ROUTINE W REFLEX MICROSCOPIC
Bilirubin Urine: NEGATIVE
Glucose, UA: 50 mg/dL — AB
Hgb urine dipstick: NEGATIVE
Ketones, ur: NEGATIVE mg/dL
Leukocytes,Ua: NEGATIVE
Nitrite: NEGATIVE
Protein, ur: NEGATIVE mg/dL
Specific Gravity, Urine: 1.009 (ref 1.005–1.030)
pH: 5 (ref 5.0–8.0)

## 2021-09-14 LAB — COMPREHENSIVE METABOLIC PANEL
ALT: 59 U/L — ABNORMAL HIGH (ref 0–44)
AST: 44 U/L — ABNORMAL HIGH (ref 15–41)
Albumin: 3.6 g/dL (ref 3.5–5.0)
Alkaline Phosphatase: 86 U/L (ref 38–126)
Anion gap: 8 (ref 5–15)
BUN: 26 mg/dL — ABNORMAL HIGH (ref 6–20)
CO2: 24 mmol/L (ref 22–32)
Calcium: 9.5 mg/dL (ref 8.9–10.3)
Chloride: 107 mmol/L (ref 98–111)
Creatinine, Ser: 1.44 mg/dL — ABNORMAL HIGH (ref 0.61–1.24)
GFR, Estimated: 58 mL/min — ABNORMAL LOW (ref >60)
Glucose, Bld: 113 mg/dL — ABNORMAL HIGH (ref 70–99)
Potassium: 4.8 mmol/L (ref 3.5–5.1)
Sodium: 139 mmol/L (ref 135–145)
Total Bilirubin: 1 mg/dL (ref 0.3–1.2)
Total Protein: 6.6 g/dL (ref 6.5–8.1)

## 2021-09-14 LAB — LACTIC ACID, PLASMA: Lactic Acid, Venous: 2.2 mmol/L (ref 0.5–1.9)

## 2021-09-14 LAB — PROTIME-INR
INR: 1 (ref 0.8–1.2)
Prothrombin Time: 13.1 seconds (ref 11.4–15.2)

## 2021-09-14 LAB — TROPONIN I (HIGH SENSITIVITY): Troponin I (High Sensitivity): 80 ng/L — ABNORMAL HIGH (ref <18)

## 2021-09-14 LAB — BRAIN NATRIURETIC PEPTIDE: B Natriuretic Peptide: 178.4 pg/mL — ABNORMAL HIGH (ref 0.0–100.0)

## 2021-09-14 MED ORDER — SODIUM CHLORIDE 0.9 % IV BOLUS
500.0000 mL | Freq: Once | INTRAVENOUS | Status: AC
  Administered 2021-09-14: 500 mL via INTRAVENOUS

## 2021-09-14 NOTE — ED Triage Notes (Signed)
Pt arrive by [facility] from home for c/o sudden increase weakness, diaphoresis and getting very lethargic while talking with family. Per family he had stent placed last week and his EF is 30%. BP 92/60, HR 50, SPO2 98% cbg 102. 500 mL NS given by EMS pta. Pt denies any pain or SOB ?

## 2021-09-14 NOTE — ED Notes (Signed)
Date and time results received: 09/14/21  ? ?Test: lactic ?Critical Value: 2.2 ? ?Name of Provider Notified: Darl Householder ? ?

## 2021-09-14 NOTE — ED Notes (Signed)
Bear hugger applied to the pt. ?

## 2021-09-14 NOTE — ED Provider Notes (Signed)
?[facility] ?Provider Note ? ? ?CSN: [id number] ?Arrival date & time: 09/14/21  2055 ? ?  ?History ? ?Chief Complaint  ?Patient presents with  ? Weakness  ? ? ?Clarence [name] is a 51 y.o. male recent CHF exacerbation, hypertensive emergency with elevated trop thought due to demand here for evaluation of weakness.  Was in his normal state of health since discharge 3 days ago.  Was standing felt sudden onset diaphoresis, lightheadedness, dizziness, bilateral "tunnel" vision like he was going to have a syncopal event.  Became confused, does not remember everything. Unclear if syncope. Did not fall to ground.  EMS arrival EMS unable to palp blood pressure, patient altered.  No CPR per EMS.  Given 500 cc fluid bolus.  Patient just states he feels "off."  He has no numbness, unilateral weakness, facial droop.  Not a code stroke. Was outside for approximately 3 min.  Compliant with his home meds. ? ?HPI ? ?  ? ?Home Medications ?Prior to Admission medications   ?Medication Sig Start Date End Date Taking? Authorizing Provider  ?aspirin 81 MG EC tablet Take 1 tablet (81 mg total) by mouth daily. Swallow whole. 09/11/21 10/11/21  Jennye Boroughs, MD  ?atorvastatin (LIPITOR) 80 MG tablet Take 1 tablet (80 mg total) by mouth every evening. 08/10/21 09/09/21  Wyvonnia Dusky, MD  ?carvedilol (COREG) 6.25 MG tablet Take 1 tablet (6.25 mg total) by mouth 2 (two) times daily with a meal. 09/11/21   Jennye Boroughs, MD  ?clopidogrel (PLAVIX) 75 MG tablet Take 1 tablet (75 mg total) by mouth daily. 09/12/21   Jennye Boroughs, MD  ?dapagliflozin propanediol (FARXIGA) 10 MG TABS tablet Take 1 tablet (10 mg total) by mouth daily. 09/11/21 10/11/21  Jennye Boroughs, MD  ?furosemide (LASIX) 40 MG tablet Take 0.5 tablets (20 mg total) by mouth every other day. 09/11/21 10/11/21  Jennye Boroughs, MD  ?isosorbide-hydrALAZINE (BIDIL) 20-37.5 MG tablet Take 1 tablet by mouth 3 (three) times daily. 09/11/21   Jennye Boroughs, MD  ?losartan (COZAAR) 25 MG tablet Take 1 tablet (25 mg total) by mouth daily. 09/11/21 09/11/22  Jennye Boroughs, MD  ?spironolactone (ALDACTONE) 25 MG tablet Take 0.5 tablets (12.5 mg total) by mouth daily. 08/29/21 11/27/21  Rise Mu, PA-C  ?   ? ?Allergies    ?Ibuprofen, Neomycin-bacitracin zn-polymyx, and Sulfacetamide   ? ?Review of Systems   ?Review of Systems  ?Constitutional:  Positive for activity change and diaphoresis.  ?HENT: Negative.    ?Respiratory: Negative.    ?Cardiovascular: Negative.   ?Gastrointestinal: Negative.   ?Genitourinary: Negative.   ?Musculoskeletal: Negative.   ?Skin: Negative.   ?Neurological:  Positive for dizziness, syncope (near syncope), weakness and light-headedness. Negative for tremors, seizures, facial asymmetry, speech difficulty, numbness and headaches.  ?All other systems reviewed and are negative. ? ?Physical Exam ?Updated Vital Signs ?BP 122/81   Pulse 67   Temp 97.6 ?F (36.4 ?C) (Oral)   Resp (!) 22   Ht 5' 10"  (1.778 m)   Wt 95.4 kg   SpO2 99%   BMI 30.18 kg/m?  ?Physical Exam ?Vitals and nursing note reviewed.  ?Constitutional:   ?   General: He is not in acute distress. ?   Appearance: He is well-developed. He is ill-appearing. He is not toxic-appearing or diaphoretic.  ?HENT:  ?   Head: Normocephalic and atraumatic.  ?   Nose: Nose normal.  ?   Mouth/Throat:  ?   Mouth: Mucous  membranes are moist.  ?Eyes:  ?   Pupils: Pupils are equal, round, and reactive to light.  ?Neck:  ?   Comments: Full ROM ?Cardiovascular:  ?   Rate and Rhythm: Normal rate and regular rhythm.  ?   Pulses: Normal pulses.     ?     Radial pulses are 2+ on the right side and 2+ on the left side.  ?     Dorsalis pedis pulses are 2+ on the right side and 2+ on the left side.  ?   Heart sounds: Normal heart sounds.  ?Pulmonary:  ?   Effort: Pulmonary effort is normal. No respiratory distress.  ?   Breath sounds: Normal breath sounds.  ?   Comments: Clear bil speaks in full  sentences without difficulty ?Chest:  ?   Comments: Non tender chest wall without crepitus or overlying skin changes ?Abdominal:  ?   General: Bowel sounds are normal. There is no distension.  ?   Palpations: Abdomen is soft.  ?   Tenderness: There is no abdominal tenderness. There is no right CVA tenderness, left CVA tenderness, guarding or rebound.  ?   Comments: Ostomy bag right lower abdomen  ?Musculoskeletal:     ?   General: Normal range of motion.  ?   Cervical back: Normal range of motion and neck supple.  ?   Comments: Cold bilateral upper extremities, nontender, compartments soft, full range of motion ?Moves bilateral lower extremities at difficulty, compartments soft.  Lift bilateral legs off bed  ?Skin: ?   General: Skin is warm and dry.  ?   Capillary Refill: Capillary refill takes less than 2 seconds.  ?   Comments: No rashes or lesions, erythema or warmth ?Cold bilateral upper extremities ?Warm bilateral lower extremities  ?Neurological:  ?   General: No focal deficit present.  ?   Mental Status: He is alert and oriented to person, place, and time.  ?   Cranial Nerves: Cranial nerves 2-12 are intact.  ?   Sensory: Sensation is intact.  ?   Motor: Weakness (generalized) present.  ?   Comments: CN 2-12 grossly intact ?Neg drift ?Equal strength Bil however generalized weakness throughout ?Intact sensation ?Unable to ambulate  ? ?ED Results / Procedures / Treatments   ?Labs ?(all labs ordered are listed, but only abnormal results are displayed) ?Labs Reviewed  ?COMPREHENSIVE METABOLIC PANEL - Abnormal; Notable for the following components:  ?    Result Value  ? Glucose, Bld 113 (*)   ? BUN 26 (*)   ? Creatinine, Ser 1.44 (*)   ? AST 44 (*)   ? ALT 59 (*)   ? GFR, Estimated 58 (*)   ? All other components within normal limits  ?BRAIN NATRIURETIC PEPTIDE - Abnormal; Notable for the following components:  ? B Natriuretic Peptide 178.4 (*)   ? All other components within normal limits  ?URINALYSIS, ROUTINE W  REFLEX MICROSCOPIC - Abnormal; Notable for the following components:  ? Glucose, UA 50 (*)   ? All other components within normal limits  ?LACTIC ACID, PLASMA - Abnormal; Notable for the following components:  ? Lactic Acid, Venous 2.2 (*)   ? All other components within normal limits  ?TROPONIN I (HIGH SENSITIVITY) - Abnormal; Notable for the following components:  ? Troponin I (High Sensitivity) 80 (*)   ? All other components within normal limits  ?CULTURE, BLOOD (ROUTINE X 2)  ?CULTURE, BLOOD (ROUTINE X 2)  ?CBC  WITH DIFFERENTIAL/PLATELET  ?PROTIME-INR  ?LACTIC ACID, PLASMA  ?TROPONIN I (HIGH SENSITIVITY)  ? ? ?EKG ?EKG Interpretation ? ?Date/Time:  Sunday September 14 2021 21:25:03 EDT ?Ventricular Rate:  53 ?PR Interval:  188 ?QRS Duration: 120 ?QT Interval:  473 ?QTC Calculation: 445 ?R Axis:   34 ?Text Interpretation: Sinus rhythm Probable left atrial enlargement Incomplete left bundle branch block LVH with secondary repolarization abnormality Abnormal inferior Q waves Borderline ST elevation, inferior leads No significant change since last tracing Confirmed by Wandra Arthurs [phone]) on 09/14/2021 11:23:24 PM ? ?Radiology ?CT HEAD WO CONTRAST (5MM) ? ?Result Date: 09/14/2021 ?CLINICAL DATA:  Headache, new or worsening EXAM: CT HEAD WITHOUT CONTRAST TECHNIQUE: Contiguous axial images were obtained from the base of the skull through the vertex without intravenous contrast. RADIATION DOSE REDUCTION: This exam was performed according to the departmental dose-optimization program which includes automated exposure control, adjustment of the mA and/or kV according to patient size and/or use of iterative reconstruction technique. COMPARISON:  None. FINDINGS: Brain: No evidence of acute infarction, hemorrhage, cerebral edema, mass, mass effect, or midline shift. No hydrocephalus or extra-axial fluid collection. Vascular: No hyperdense vessel. Skull: Normal. Negative for fracture or focal lesion. Sinuses/Orbits: No acute  finding. Other: The mastoid air cells are well aerated. IMPRESSION: No acute intracranial process. No etiology is seen for the patient's headache. Electronically Signed   By: Merilyn Baba M.D.   On: 09/14/2021 23:03

## 2021-09-15 ENCOUNTER — Observation Stay (HOSPITAL_COMMUNITY): Payer: Managed Care, Other (non HMO)

## 2021-09-15 DIAGNOSIS — R55 Syncope and collapse: Secondary | ICD-10-CM

## 2021-09-15 DIAGNOSIS — I10 Essential (primary) hypertension: Secondary | ICD-10-CM | POA: Diagnosis not present

## 2021-09-15 DIAGNOSIS — R531 Weakness: Secondary | ICD-10-CM | POA: Diagnosis not present

## 2021-09-15 DIAGNOSIS — I5042 Chronic combined systolic (congestive) and diastolic (congestive) heart failure: Secondary | ICD-10-CM

## 2021-09-15 DIAGNOSIS — I952 Hypotension due to drugs: Principal | ICD-10-CM

## 2021-09-15 LAB — CBC
HCT: 40.8 % (ref 39.0–52.0)
Hemoglobin: 13.7 g/dL (ref 13.0–17.0)
MCH: 28.4 pg (ref 26.0–34.0)
MCHC: 33.6 g/dL (ref 30.0–36.0)
MCV: 84.5 fL (ref 80.0–100.0)
Platelets: 190 10*3/uL (ref 150–400)
RBC: 4.83 MIL/uL (ref 4.22–5.81)
RDW: 14 % (ref 11.5–15.5)
WBC: 9.4 10*3/uL (ref 4.0–10.5)
nRBC: 0 % (ref 0.0–0.2)

## 2021-09-15 LAB — BASIC METABOLIC PANEL
Anion gap: 8 (ref 5–15)
BUN: 26 mg/dL — ABNORMAL HIGH (ref 6–20)
CO2: 25 mmol/L (ref 22–32)
Calcium: 9.3 mg/dL (ref 8.9–10.3)
Chloride: 106 mmol/L (ref 98–111)
Creatinine, Ser: 1.45 mg/dL — ABNORMAL HIGH (ref 0.61–1.24)
GFR, Estimated: 58 mL/min — ABNORMAL LOW (ref >60)
Glucose, Bld: 95 mg/dL (ref 70–99)
Potassium: 4.1 mmol/L (ref 3.5–5.1)
Sodium: 139 mmol/L (ref 135–145)

## 2021-09-15 LAB — D-DIMER, QUANTITATIVE: D-Dimer, Quant: 1.99 ug/mL-FEU — ABNORMAL HIGH (ref 0.00–0.50)

## 2021-09-15 MED ORDER — ASPIRIN EC 81 MG PO TBEC
81.0000 mg | DELAYED_RELEASE_TABLET | Freq: Every day | ORAL | Status: DC
  Administered 2021-09-15: 81 mg via ORAL
  Filled 2021-09-15: qty 1

## 2021-09-15 MED ORDER — ACETAMINOPHEN 650 MG RE SUPP: 650.0000 mg | Freq: Four times a day (QID) | RECTAL | Status: DC | PRN

## 2021-09-15 MED ORDER — ATORVASTATIN CALCIUM 40 MG PO TABS: 80.0000 mg | ORAL_TABLET | Freq: Every evening | ORAL | Status: DC

## 2021-09-15 MED ORDER — ACETAMINOPHEN 325 MG PO TABS: 650.0000 mg | ORAL_TABLET | Freq: Four times a day (QID) | ORAL | Status: DC | PRN

## 2021-09-15 MED ORDER — IOHEXOL 350 MG/ML SOLN
100.0000 mL | Freq: Once | INTRAVENOUS | Status: AC | PRN
  Administered 2021-09-15: 100 mL via INTRAVENOUS

## 2021-09-15 MED ORDER — DAPAGLIFLOZIN PROPANEDIOL 10 MG PO TABS
10.0000 mg | ORAL_TABLET | Freq: Every day | ORAL | Status: DC
  Administered 2021-09-15: 10 mg via ORAL
  Filled 2021-09-15: qty 1

## 2021-09-15 MED ORDER — ENOXAPARIN SODIUM 40 MG/0.4ML IJ SOSY
40.0000 mg | PREFILLED_SYRINGE | INTRAMUSCULAR | Status: DC
  Administered 2021-09-15: 40 mg via SUBCUTANEOUS
  Filled 2021-09-15: qty 0.4

## 2021-09-15 MED ORDER — ONDANSETRON HCL 4 MG/2ML IJ SOLN: 4.0000 mg | Freq: Four times a day (QID) | INTRAMUSCULAR | Status: DC | PRN

## 2021-09-15 MED ORDER — ONDANSETRON HCL 4 MG PO TABS: 4.0000 mg | ORAL_TABLET | Freq: Four times a day (QID) | ORAL | Status: DC | PRN

## 2021-09-15 MED ORDER — CLOPIDOGREL BISULFATE 75 MG PO TABS
75.0000 mg | ORAL_TABLET | Freq: Every day | ORAL | Status: DC
  Administered 2021-09-15: 75 mg via ORAL
  Filled 2021-09-15: qty 1

## 2021-09-15 NOTE — Discharge Summary (Signed)
Physician Discharge Summary  KAS SHYNE ZHY:[id number] DOB: 06/06/1970 DOA: 09/14/2021  PCP: Pcp, No  Admit date: 09/14/2021 Discharge date: 09/15/2021  Admitted From: Home Disposition: Home  Recommendations for Outpatient Follow-up:  Follow up with PCP in 1-2 weeks Please obtain BMP/CBC in one week Please follow up with cardiology as scheduled  Home Health: None Equipment/Devices: None  Discharge Condition: Stable CODE STATUS: Full Diet recommendation: Low-salt low-fat diet  Brief/Interim Summary: Clarence [name] is a 51 y.o. male with medical history significant of combined CHF, malignant HTN.  Pt recently admitted for HTN emergency earlier last week. Discharged on 4/25 on multiple new BP meds.  Patient noted to have worsening hypotension diaphoresis and presyncope at home in the setting of multiple new medications, improved with fluid challenge and cessation of home medications.  Patient also notably continued his ACE inhibitor despite new medication of ARB and instructions to discontinue this medication.  Patient's orthostatics have improved with medication cessation, otherwise stable and agreeable for discharge home, ambulating without any further symptoms, blood pressure now improving.  We will continue patient at discharge on carvedilol, furosemide, spironolactone given his comorbidities but discontinue all other blood pressure medications.  Discussed taking blood pressure readings at home every morning and if any symptoms should recur he should report back to the ED or call his PCP for further work-up and evaluation.  Patient otherwise back to baseline stable and agreeable for discharge.    Hypotension due to medication History of essential hypertension Resolved, imaging negative for PE Continue carvedilol furosemide and spironolactone as above   Chronic combined systolic and diastolic CHF (congestive heart failure) (HCC) Continue carvedilol furosemide and spironolactone  as above  Discharge Diagnoses:  Principal Problem:   Hypotension due to medication Active Problems:   Chronic combined systolic and diastolic CHF (congestive heart failure) [facility])   Essential hypertension    Discharge Instructions  Discharge Instructions     Discharge patient   Complete by: As directed    Discharge disposition: 01-Home or Self Care   Discharge patient date: 09/15/2021      Allergies as of 09/15/2021       Reactions   Ibuprofen Other (See Comments), Hives   Other reaction(s): GI Upset (intolerance) Hives   Neomycin-bacitracin Zn-polymyx    Other reaction(s): Other (See Comments)   Sulfacetamide Other (See Comments)        Medication List     STOP taking these medications    isosorbide-hydrALAZINE 20-37.5 MG tablet Commonly known as: BIDIL   lisinopril-hydrochlorothiazide 20-25 MG tablet Commonly known as: ZESTORETIC   losartan 25 MG tablet Commonly known as: Cozaar       TAKE these medications    acetaminophen 325 MG tablet Commonly known as: TYLENOL Take 975 mg by mouth every 6 (six) hours as needed for headache or mild pain.   aspirin 81 MG EC tablet Take 1 tablet (81 mg total) by mouth daily. Swallow whole.   atorvastatin 80 MG tablet Commonly known as: LIPITOR Take 1 tablet (80 mg total) by mouth every evening.   carvedilol 6.25 MG tablet Commonly known as: COREG Take 1 tablet (6.25 mg total) by mouth 2 (two) times daily with a meal.   clopidogrel 75 MG tablet Commonly known as: PLAVIX Take 1 tablet (75 mg total) by mouth daily.   dapagliflozin propanediol 10 MG Tabs tablet Commonly known as: FARXIGA Take 1 tablet (10 mg total) by mouth daily.   furosemide 40 MG tablet Commonly known as: LASIX  Take 0.5 tablets (20 mg total) by mouth every other day.   ondansetron 4 MG disintegrating tablet Commonly known as: ZOFRAN-ODT Take 4 mg by mouth every 8 (eight) hours as needed for nausea or vomiting.   spironolactone 25 MG  tablet Commonly known as: ALDACTONE Take 0.5 tablets (12.5 mg total) by mouth daily.        Allergies  Allergen Reactions   Ibuprofen Other (See Comments) and Hives    Other reaction(s): GI Upset (intolerance) Hives    Neomycin-Bacitracin Zn-Polymyx     Other reaction(s): Other (See Comments)   Sulfacetamide Other (See Comments)    Consultations: None   Procedures/Studies: CT HEAD WO CONTRAST ( )  Result Date: 09/14/2021 CLINICAL DATA:  Headache, new or worsening EXAM: CT HEAD WITHOUT CONTRAST TECHNIQUE: Contiguous axial images were obtained from the base of the skull through the vertex without intravenous contrast. RADIATION DOSE REDUCTION: This exam was performed according to the departmental dose-optimization program which includes automated exposure control, adjustment of the mA and/or kV according to patient size and/or use of iterative reconstruction technique. COMPARISON:  None. FINDINGS: Brain: No evidence of acute infarction, hemorrhage, cerebral edema, mass, mass effect, or midline shift. No hydrocephalus or extra-axial fluid collection. Vascular: No hyperdense vessel. Skull: Normal. Negative for fracture or focal lesion. Sinuses/Orbits: No acute finding. Other: The mastoid air cells are well aerated. IMPRESSION: No acute intracranial process. No etiology is seen for the patient's headache. Electronically Signed   By: Wiliam Ke M.D.   On: 09/14/2021 23:03   CT Angio Chest Pulmonary Embolism (PE) W or WO Contrast  Result Date: 09/15/2021 CLINICAL DATA:  Shortness of breath and chest pain. Positive D-dimer. Clinical suspicion for pulmonary embolism. EXAM: CT ANGIOGRAPHY CHEST WITH CONTRAST TECHNIQUE: Multidetector CT imaging of the chest was performed using the standard protocol during bolus administration of intravenous contrast. Multiplanar CT image reconstructions and MIPs were obtained to evaluate the vascular anatomy. RADIATION DOSE REDUCTION: This exam was performed  according to the departmental dose-optimization program which includes automated exposure control, adjustment of the mA and/or kV according to patient size and/or use of iterative reconstruction technique. CONTRAST:  OMNIPAQUE IOHEXOL 350 MG/ML SOLN COMPARISON:  08/04/2021 FINDINGS: Cardiovascular: Satisfactory opacification of pulmonary arteries noted, and no pulmonary emboli identified. No evidence of thoracic aortic aneurysm. Mediastinum/Nodes: No masses or pathologically enlarged lymph nodes identified. Lungs/Pleura: No pulmonary mass, infiltrate, or effusion. Upper abdomen: No acute findings. Musculoskeletal: No suspicious bone lesions identified. Review of the MIP images confirms the above findings. IMPRESSION: Negative. No evidence of pulmonary embolism or other active disease. Electronically Signed   By: Danae Orleans M.D.   On: 09/15/2021 13:44   US Renal  Result Date: 08/29/2021 CLINICAL DATA:  Acute kidney injury. EXAM: RENAL / URINARY TRACT ULTRASOUND COMPLETE COMPARISON:  CT abdomen and pelvis without contrast 02/06/2020 FINDINGS: Right Kidney: Renal measurements: 10.6 x 5.8 x 6.4 cm = volume: 205 mL. Echogenicity within normal limits. No mass or hydronephrosis visualized. Left Kidney: Renal measurements: 11.6 x 4.6 x 5.3 cm = volume: 145 mL. Echogenicity within normal limits. No mass or hydronephrosis visualized. Bladder: Urinary bladder is only minimally distended, limiting evaluation. No gross abnormality is seen. Other: None. IMPRESSION: Normal appearance of the bilateral kidneys. Electronically Signed   By: Neita Garnet M.D.   On: 08/29/2021 16:37   DG Chest Port 1 View  Result Date: 09/14/2021 CLINICAL DATA:  Chest pain, weakness and shortness of breath. EXAM: PORTABLE CHEST 1 VIEW COMPARISON:  September 09, 2021 FINDINGS: The cardiac silhouette is enlarged and unchanged in size. Very mild atelectasis is seen within the left lung base. Both lungs are otherwise clear. The visualized  skeletal structures are unremarkable. IMPRESSION: 1. Stable cardiomegaly. 2. Very mild left basilar atelectasis. Electronically Signed   By: Aram Candela M.D.   On: 09/14/2021 21:30   DG Chest Portable 1 View  Result Date: 09/09/2021 CLINICAL DATA:  Chest pain and shortness of breath EXAM: PORTABLE CHEST 1 VIEW COMPARISON:  08/04/2021 FINDINGS: Cardiomegaly and vascular pedicle widening. Congested appearance of vessels. No effusion or pneumothorax. Artifact from EKG leads and defibrillator pads. IMPRESSION: Cardiomegaly and mild vascular congestion. Electronically Signed   By: Tiburcio Pea M.D.   On: 09/09/2021 04:01     Subjective: No acute issues or events overnight   Discharge Exam: Vitals:   09/15/21 1400 09/15/21 1430  BP: 133/89 116/83  Pulse: 72 80  Resp: (!) 21 16  Temp:    SpO2: 98% 96%   Vitals:   09/15/21 1130 09/15/21 1200 09/15/21 1400 09/15/21 1430  BP: 104/81 120/76 133/89 116/83  Pulse: 63 66 72 80  Resp: 18 (!) 23 (!) 21 16  Temp:      TempSrc:      SpO2: 97% 97% 98% 96%  Weight:      Height:        General: Pt is alert, awake, not in acute distress Cardiovascular: RRR, S1/S2 +, no rubs, no gallops Respiratory: CTA bilaterally, no wheezing, no rhonchi Abdominal: Soft, NT, ND, bowel sounds + Extremities: no edema, no cyanosis    The results of significant diagnostics from this hospitalization (including imaging, microbiology, ancillary and laboratory) are listed below for reference.     Microbiology: Recent Results (from the past 240 hour(s))  Blood culture (routine x 2)     Status: None (Preliminary result)   Collection Time: 09/14/21 10:05 PM   Specimen: BLOOD LEFT HAND  Result Value Ref Range Status   Specimen Description BLOOD LEFT HAND  Final   Special Requests   Final    BOTTLES DRAWN AEROBIC ONLY Blood Culture results may not be optimal due to an inadequate volume of blood received in culture bottles   Culture   Final    NO GROWTH <  12 HOURS Performed at [facility], [address], [place], [place] [address]    Report Status PENDING  Incomplete  Blood culture (routine x 2)     Status: None (Preliminary result)   Collection Time: 09/14/21 10:06 PM   Specimen: BLOOD LEFT HAND  Result Value Ref Range Status   Specimen Description BLOOD LEFT HAND  Final   Special Requests   Final    BOTTLES DRAWN AEROBIC AND ANAEROBIC Blood Culture results may not be optimal due to an inadequate volume of blood received in culture bottles   Culture   Final    NO GROWTH < 12 HOURS Performed at [facility], [address], [place], [place] [address]    Report Status PENDING  Incomplete     Labs: BNP (last 3 results) Recent Labs    08/29/21 1453 09/09/21 0329 09/14/21 2104  BNP 122.6* 478.7* 178.4*   Basic Metabolic Panel: Recent Labs  Lab 09/09/21 0329 09/10/21 0444 09/11/21 0602 09/14/21 2104 09/15/21 0326  NA 138 139  --  139 139  K 3.9 4.2  --  4.8 4.1  CL 106 105  --  107 106  CO2  26 25  --  24 25  GLUCOSE 111* 97  --  113* 95  BUN 22* 29*  --  26* 26*  CREATININE 1.31* 1.43* 1.32* 1.44* 1.45*  CALCIUM 9.4 9.5  --  9.5 9.3  MG  --  2.0 2.0  --   --    Liver Function Tests: Recent Labs  Lab 09/09/21 0329 09/14/21 2104  AST 41 44*  ALT 53* 59*  ALKPHOS 112 86  BILITOT 0.6 1.0  PROT 7.6 6.6  ALBUMIN 3.8 3.6   No results for input(s): LIPASE, AMYLASE in the last 168 hours. No results for input(s): AMMONIA in the last 168 hours. CBC: Recent Labs  Lab 09/09/21 0329 09/10/21 0444 09/14/21 2104 09/15/21 0326  WBC 10.1 8.6 7.1 9.4  NEUTROABS 6.0  --  5.1  --   HGB 15.7 14.4 14.3 13.7  HCT 46.1 41.4 42.7 40.8  MCV 82.0 81.7 85.2 84.5  PLT 266 215 189 190   Cardiac Enzymes: No results for input(s): CKTOTAL, CKMB, CKMBINDEX, TROPONINI in the last 168 hours. BNP: Invalid input(s): POCBNP CBG: Recent Labs  Lab 09/09/21 0640  GLUCAP 109*   D-Dimer Recent Labs    09/15/21 0326   DDIMER 1.99*   Hgb A1c No results for input(s): HGBA1C in the last 72 hours. Lipid Profile No results for input(s): CHOL, HDL, LDLCALC, TRIG, CHOLHDL, LDLDIRECT in the last 72 hours. Thyroid function studies No results for input(s): TSH, T4TOTAL, T3FREE, THYROIDAB in the last 72 hours.  Invalid input(s): FREET3 Anemia work up No results for input(s): VITAMINB12, FOLATE, FERRITIN, TIBC, IRON, RETICCTPCT in the last 72 hours. Urinalysis    Component Value Date/Time   COLORURINE YELLOW 09/14/2021 2233   APPEARANCEUR CLEAR 09/14/2021 2233   APPEARANCEUR Clear 10/24/2012 1000   LABSPEC 1.009 09/14/2021 2233   LABSPEC 1.015 10/24/2012 1000   PHURINE 5.0 09/14/2021 2233   GLUCOSEU 50 (A) 09/14/2021 2233   GLUCOSEU Negative 10/24/2012 1000   HGBUR NEGATIVE 09/14/2021 2233   BILIRUBINUR NEGATIVE 09/14/2021 2233   BILIRUBINUR Negative 10/24/2012 1000   KETONESUR NEGATIVE 09/14/2021 2233   PROTEINUR NEGATIVE 09/14/2021 2233   NITRITE NEGATIVE 09/14/2021 2233   LEUKOCYTESUR NEGATIVE 09/14/2021 2233   LEUKOCYTESUR Negative 10/24/2012 1000   Sepsis Labs Invalid input(s): PROCALCITONIN,  WBC,  LACTICIDVEN Microbiology Recent Results (from the past 240 hour(s))  Blood culture (routine x 2)     Status: None (Preliminary result)   Collection Time: 09/14/21 10:05 PM   Specimen: BLOOD LEFT HAND  Result Value Ref Range Status   Specimen Description BLOOD LEFT HAND  Final   Special Requests   Final    BOTTLES DRAWN AEROBIC ONLY Blood Culture results may not be optimal due to an inadequate volume of blood received in culture bottles   Culture   Final    NO GROWTH < 12 HOURS Performed at [facility], [address] [place], [place] [address]    Report Status PENDING  Incomplete  Blood culture (routine x 2)     Status: None (Preliminary result)   Collection Time: 09/14/21 10:06 PM   Specimen: BLOOD LEFT HAND  Result Value Ref Range Status   Specimen Description BLOOD LEFT HAND   Final   Special Requests   Final    BOTTLES DRAWN AEROBIC AND ANAEROBIC Blood Culture results may not be optimal due to an inadequate volume of blood received in culture bottles   Culture   Final    NO GROWTH <  12 HOURS Performed at [facility], [address], [place], [place] [address]    Report Status PENDING  Incomplete     Time coordinating discharge: Over 30 minutes  SIGNED:   Azucena Fallen, DO [facility] 09/15/2021, 2:40 PM Pager   If 7PM-7AM, please contact night-coverage [email or web address]

## 2021-09-15 NOTE — Assessment & Plan Note (Addendum)
1. Cont Jardiance ?2. Hold diuretics ?3. Hold BP meds including ARB due to severe hypotension ?

## 2021-09-15 NOTE — ED Notes (Signed)
Breakfast Order Placed ?

## 2021-09-15 NOTE — H&P (Signed)
?History and Physical  ? ? ?Patient: Clarence [name] JGO:[id number] DOB: Apr 25, 1971 ?DOA: 09/14/2021 ?DOS: the patient was seen and examined on 09/15/2021 ?PCP: Pcp, No  ?Patient coming from: Home ? ?Chief Complaint:  ?Chief Complaint  ?Patient presents with  ? Weakness  ? ?HPI: Clarence [name] is a 51 y.o. male with medical history significant of combined CHF, malignant HTN.  Pt recently admitted for HTN emergency earlier last week.  Discharged on 4/25 on multiple new BP meds. ? ?Pt says he had been doing okay at home until this evening when he had sudden onset of generalized weakness, diaphoresis, pre-syncope.  No fall, no trauma. ? ?Pt reportedly altered on EMS arrival and they wernt able to get BP. ? ?Given 500cc bolus in ambulance and 500cc bolus in ED. ? ?Pt now mentating normally but still feels somewhat weak with SBP 90.  ?Review of Systems: As mentioned in the history of present illness. All other systems reviewed and are negative. ?Past Medical History:  ?Diagnosis Date  ? CHF (congestive heart failure) ([place])   ? Chronic kidney disease   ? Crohn's disease ([place])   ? Hypertension   ? Proteinuria   ? ?Past Surgical History:  ?Procedure Laterality Date  ? COLECTOMY    ? RIGHT/LEFT HEART CATH AND CORONARY ANGIOGRAPHY N/A 08/07/2021  ? Procedure: RIGHT/LEFT HEART CATH AND CORONARY ANGIOGRAPHY;  Surgeon: Nelva Bush, MD;  Location: [facility];  Service: Cardiovascular;  Laterality: N/A;  ? ?Social History:  reports that he quit smoking about 4 weeks ago. His smoking use included cigars. He does not have any smokeless tobacco history on file. He reports that he does not currently use alcohol after a past usage of about 1.0 standard drink per week. He reports that he does not use drugs. ? ?Allergies  ?Allergen Reactions  ? Ibuprofen Other (See Comments) and Hives  ?  Other reaction(s): GI Upset (intolerance) ?Hives ?  ? Neomycin-Bacitracin Zn-Polymyx   ?  Other reaction(s): Other (See Comments)  ?  Sulfacetamide Other (See Comments)  ? ? ?Family History  ?Problem Relation Age of Onset  ? Heart disease Maternal Uncle   ? Pancreatic cancer Maternal Uncle   ? Cancer Maternal Aunt   ? ? ?Prior to Admission medications   ?Medication Sig Start Date End Date Taking? Authorizing Provider  ?acetaminophen (TYLENOL) 325 MG tablet Take 975 mg by mouth every 6 (six) hours as needed for headache or mild pain.   Yes [provider]  ?aspirin 81 MG EC tablet Take 1 tablet (81 mg total) by mouth daily. Swallow whole. 09/11/21 10/11/21 Yes Jennye Boroughs, MD  ?atorvastatin (LIPITOR) 80 MG tablet Take 1 tablet (80 mg total) by mouth every evening. 08/10/21 09/15/21 Yes Wyvonnia Dusky, MD  ?carvedilol (COREG) 6.25 MG tablet Take 1 tablet (6.25 mg total) by mouth 2 (two) times daily with a meal. 09/11/21  Yes Jennye Boroughs, MD  ?clopidogrel (PLAVIX) 75 MG tablet Take 1 tablet (75 mg total) by mouth daily. 09/12/21  Yes Jennye Boroughs, MD  ?dapagliflozin propanediol (FARXIGA) 10 MG TABS tablet Take 1 tablet (10 mg total) by mouth daily. 09/11/21 10/11/21 Yes Jennye Boroughs, MD  ?furosemide (LASIX) 40 MG tablet Take 0.5 tablets (20 mg total) by mouth every other day. 09/11/21 10/11/21 Yes Jennye Boroughs, MD  ?isosorbide-hydrALAZINE (BIDIL) 20-37.5 MG tablet Take 1 tablet by mouth 3 (three) times daily. 09/11/21  Yes Jennye Boroughs, MD  ?lisinopril-hydrochlorothiazide (ZESTORETIC) 20-25 MG tablet Take 1 tablet  by mouth daily.   Yes [provider]  ?losartan (COZAAR) 25 MG tablet Take 1 tablet (25 mg total) by mouth daily. 09/11/21 09/11/22 Yes Jennye Boroughs, MD  ?ondansetron (ZOFRAN-ODT) 4 MG disintegrating tablet Take 4 mg by mouth every 8 (eight) hours as needed for nausea or vomiting.   Yes [provider]  ?spironolactone (ALDACTONE) 25 MG tablet Take 0.5 tablets (12.5 mg total) by mouth daily. 08/29/21 11/27/21 Yes Dunn, Areta Haber, PA-C  ? ? ?Physical Exam: ?Vitals:  ? 09/15/21 0015 09/15/21 0030 09/15/21 0145  09/15/21 0200  ?BP: 100/71 97/72 101/63 99/68  ?Pulse: 72 73 79 75  ?Resp: (!) 24 17 (!) 23 (!) 22  ?Temp:      ?TempSrc:      ?SpO2: 97% 96% 96% 97%  ?Weight:      ?Height:      ? ?Constitutional: NAD, calm, comfortable ?Eyes: PERRL, lids and conjunctivae normal ?ENMT: Mucous membranes are moist. Posterior pharynx clear of any exudate or lesions.Normal dentition.  ?Neck: normal, supple, no masses, no thyromegaly ?Respiratory: clear to auscultation bilaterally, no wheezing, no crackles. Normal respiratory effort. No accessory muscle use.  ?Cardiovascular: Regular rate and rhythm, no murmurs / rubs / gallops. No extremity edema. 2+ pedal pulses. No carotid bruits.  ?Abdomen: no tenderness, no masses palpated. No hepatosplenomegaly. Bowel sounds positive.  ?Musculoskeletal: no clubbing / cyanosis. No joint deformity upper and lower extremities. Good ROM, no contractures. Normal muscle tone. Cold extremities ?Skin: no rashes, lesions, ulcers. No induration ?Neurologic: CN 2-12 grossly intact. Sensation intact, DTR normal. Strength 5/5 in all 4.   Generalized weakness but grossly non-focal ?Psychiatric: Normal judgment and insight. Alert and oriented x 3. Normal mood.  ? ?Data Reviewed: ? ?Creat 1.4 (baseline) ? ? ?  Latest Ref Rng & Units 09/14/2021  ?  9:04 PM 09/10/2021  ?  4:44 AM 09/09/2021  ?  3:29 AM  ?CBC  ?WBC 4.0 - 10.5 K/uL 7.1   8.6   10.1    ?Hemoglobin 13.0 - 17.0 g/dL 14.3   14.4   15.7    ?Hematocrit 39.0 - 52.0 % 42.7   41.4   46.1    ?Platelets 150 - 400 K/uL 189   215   266    ? ? ?  Latest Ref Rng & Units 09/14/2021  ?  9:04 PM 09/11/2021  ?  6:02 AM 09/10/2021  ?  4:44 AM  ?BMP  ?Glucose 70 - 99 mg/dL 113    97    ?BUN 6 - 20 mg/dL 26    29    ?Creatinine 0.61 - 1.24 mg/dL 1.44   1.32   1.43    ?Sodium 135 - 145 mmol/L 139    139    ?Potassium 3.5 - 5.1 mmol/L 4.8    4.2    ?Chloride 98 - 111 mmol/L 107    105    ?CO2 22 - 32 mmol/L 24    25    ?Calcium 8.9 - 10.3 mg/dL 9.5    9.5    ? ?Trop of 80, down  from 139 on 4/25 ? ?CXR = nothing acute ? ?UA = neg ? ?Lactate 2.2 ? ?CT head = nothing acute ? ?Assessment and Plan: ?* Hypotension due to medication ?Pts hypotension is most likely felt to be due to the numerous HTN meds he was recently started on. ?Additionally looks like he didn't stop the zestoretic at home despite starting the losartan after last admit (  now on ACE + ARB). ?Hold all HTN meds ?Hold all diuretics ?Got 500cc bolus in ED ?No SIRS to suggest sepsis at this time ?Repeat CBC/BMP in AM ?BNP down significantly from recent admission, Trop also down to 80 today from 139 on 4/25; no CP, no pulm edema on CXR, cardiogenic shock also seems unlikely. ?No respiratory symptoms, EKG with mild bradycardia.  PE possible but less likely. ?Wells score = 1.5 (only because he was hospitalized recently) ?Will check d.dimer. ? ? ?Chronic combined systolic and diastolic CHF (congestive heart failure) ([place]) ?Cont Jardiance ?Hold diuretics ?Hold BP meds including ARB due to severe hypotension ? ?Essential hypertension ?Pt with severe hypotension currently, holding all home BP meds and diuretics for the moment. ?Also looks like he was still taking the lisinopril (zestoretic) in addition to the new losartan. ? ? ? ? ? Advance Care Planning:   Code Status: Full Code ? ?Consults: None ? ?Family Communication: No family in room ? ?Severity of Illness: ?The appropriate patient status for this patient is OBSERVATION. Observation status is judged to be reasonable and necessary in order to provide the required intensity of service to ensure the patient's safety. The patient's presenting symptoms, physical exam findings, and initial radiographic and laboratory data in the context of their medical condition is felt to place them at decreased risk for further clinical deterioration. Furthermore, it is anticipated that the patient will be medically stable for discharge from the hospital within 2 midnights of admission.   ? ?Author: ?Jennette Kettle M., DO ?09/15/2021 2:10 AM ? ?For on call review [email or web address].  ?

## 2021-09-15 NOTE — ED Notes (Signed)
Pt O2 sats maintained 97-98% on RA while ambulating in the hallway, denies SHOB, dizziness.  ?

## 2021-09-15 NOTE — ED Provider Notes (Signed)
?  Physical Exam  ?BP 122/81   Pulse 67   Temp 97.6 ?F (36.4 ?C) (Oral)   Resp (!) 22   Ht 5' 10"  (1.778 m)   Wt 95.4 kg   SpO2 99%   BMI 30.18 kg/m?  ? ?Physical Exam ? ?Procedures  ?Procedures ? ?ED Course / MDM  ?  ?Medical Decision Making ?Amount and/or Complexity of Data Reviewed ?Labs: ordered. ?Radiology: ordered. ?ECG/medicine tests: ordered. ? ?Risk ?Decision regarding hospitalization. ? ? ?This patient assumed from preceding ED provider Britni Henderly, PA-C.  Please see her associated note for further insight in the patient's ED course.  In brief patient recently admitted for hypertensive emergency started on new medications and discharged on 4/25.  With syncopal episode today with diaphoresis, hypotension, and hypothermia upon arrival to the ED.  Newly bradycardic and hypotensive relative to prior.  Troponin mildly elevated to the 80s. ? ?At time of shift change, plan is to admit patient to medicine service for further stabilization and medication management as well as trending troponins.  Pending hospitalist consult at time of departure of preceding ED provider. ? ?Consult to Dr. Alcario Drought, hospitalist for admission of this patient.  He is amenable to plan to receive this patient on day of service.  I appreciate his collaboration in the care of this patient. ? ?This chart was dictated using voice recognition software, Dragon. Despite the best efforts of this provider to proofread and correct errors, errors may still occur which can change documentation meaning. ? ? ? ? ? ?  ?Emeline Darling, PA-C ?09/15/21 0023 ? ?  ?Truddie Hidden, MD ?09/15/21 0123 ? ?

## 2021-09-15 NOTE — Assessment & Plan Note (Addendum)
Pt with severe hypotension currently, holding all home BP meds and diuretics for the moment. ?Also looks like he was still taking the lisinopril (zestoretic) in addition to the new losartan. ?

## 2021-09-15 NOTE — ED Notes (Signed)
Patient transported to CT 

## 2021-09-15 NOTE — Assessment & Plan Note (Signed)
Pts hypotension is most likely felt to be due to the numerous HTN meds he was recently started on. ?Additionally looks like he didn't stop the zestoretic at home despite starting the losartan after last admit (now on ACE + ARB). ?1. Hold all HTN meds ?2. Hold all diuretics ?3. Got 500cc bolus in ED ?4. No SIRS to suggest sepsis at this time ?5. Repeat CBC/BMP in AM ?6. BNP down significantly from recent admission, Trop also down to 80 today from 139 on 4/25; no CP, no pulm edema on CXR, cardiogenic shock also seems unlikely. ?7. No respiratory symptoms, EKG with mild bradycardia.  PE possible but less likely. ?1. Wells score = 1.5 (only because he was hospitalized recently) ?2. Will check d.dimer. ? ?

## 2021-09-19 ENCOUNTER — Encounter: Payer: Self-pay | Admitting: Internal Medicine

## 2021-09-19 LAB — CULTURE, BLOOD (ROUTINE X 2)
Culture: NO GROWTH
Culture: NO GROWTH

## 2021-09-22 ENCOUNTER — Telehealth: Payer: Self-pay | Admitting: Internal Medicine

## 2021-09-22 ENCOUNTER — Other Ambulatory Visit: Payer: Self-pay

## 2021-09-22 MED ORDER — ATORVASTATIN CALCIUM 80 MG PO TABS: 80.0000 mg | ORAL_TABLET | Freq: Every evening | ORAL | 0 refills | Status: DC

## 2021-09-22 MED ORDER — DAPAGLIFLOZIN PROPANEDIOL 10 MG PO TABS: 10.0000 mg | ORAL_TABLET | Freq: Every day | ORAL | 0 refills | Status: DC

## 2021-09-22 MED ORDER — DAPAGLIFLOZIN PROPANEDIOL 10 MG PO TABS
10.0000 mg | ORAL_TABLET | Freq: Every day | ORAL | 0 refills | Status: DC
Start: 2021-09-22 — End: 2021-10-20

## 2021-09-22 NOTE — Telephone Encounter (Signed)
Requested Prescriptions  ? ?Signed Prescriptions Disp Refills  ? atorvastatin (LIPITOR) 80 MG tablet 30 tablet 0  ?  Sig: Take 1 tablet (80 mg total) by mouth every evening.  ?  Authorizing Provider: Rise Mu  ?  Ordering User: Janan Ridge  ? dapagliflozin propanediol (FARXIGA) 10 MG TABS tablet 30 tablet 0  ?  Sig: Take 1 tablet (10 mg total) by mouth daily.  ?  Authorizing Provider: Rise Mu  ?  Ordering User: Janan Ridge  ? ?

## 2021-09-22 NOTE — Telephone Encounter (Signed)
?*  STAT* If patient is at the pharmacy, call can be transferred to refill team. ? ? ?1. Which medications need to be refilled? (please list name of each medication and dose if known)  ?atorvastatin (LIPITOR) 80 MG tablet ?Farxiga 54m ? ?2. Which pharmacy/location (including street and city if local pharmacy) is medication to be sent to? [id number] [place] [place] ?3. Do they need a 30 day or 90 day supply? 30 day  ?

## 2021-09-29 NOTE — Progress Notes (Signed)
? ?Cardiology Office Note   ? ?Date:  10/01/2021  ? ?ID:  Clarence [name], DOB 23-Mar-1971, MRN [id number] ? ?PCP:  Pcp, No  ?Cardiologist:  Nelva Bush, MD  ?Electrophysiologist:  None  ? ?Chief Complaint: Follow up ? ?History of Present Illness:  ? ?Clarence [name] is a 51 y.o. 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 ?  ?He was admitted to the hospital in 07/2021 with progressive dyspnea over the preceding several months and was found to have a new cardiomyopathy and atypical pneumonia.  High-sensitivity troponin 89.  BNP 559.  Echo during the admission showed an EF of 30 to 35%, global hypokinesis, moderate LVH, grade 2 diastolic dysfunction, normal RV systolic function and ventricular cavity size, mildly dilated left atrium, mild to moderate mitral regurgitation, and an estimated right atrial pressure of 15 mmHg.  He was diuresed and GDMT was initiated with symptomatic improvement.  Subsequent R/LHC showed diffuse moderate to severe multivessel CAD including CTO of the mid RCA, diffuse plaquing of LAD with up to 60% stenosis in the mid vessel, 60 to 70% proximal/mid LCx stenosis, and 90% stenosis of OM1.  Moderately elevated left heart filling pressure, severely elevated right heart filling pressure, moderate to severe pulmonary hypertension, moderately reduced cardiac output/index as outlined below.  It was suspected his cardiomyopathy was a combination of ischemic and nonischemic confounded by her significant pulmonary hypertension and right heart failure that may have been due to underlying pulmonary disease.  He was seen in hospital follow up on 08/29/2021 noting fatigue and an episode of near syncope.  BP was soft at 98/76 at that time.  Labs obtained at that time  showed acute on CKD and hyperkalemia with a potassium of 6.0, leading him to be evaluated in the ED on 4/14 with persistent hyperkalemia and AKI.  He was treated with IV fluids and Lokelma. ? ?He was admitted to the hospital from 4/25 through 4/27 with acute on chronic HFrEF secondary to accelerated hypertension.  BNP 178.  Troponin peaked at 139, and felt to represent demand ischemia.  GDMT was escalated with the addition of losartan and continuation of Coreg, BiDil, Farxiga, spironolactone, and Lasix.  ? ?He was seen in the ED on 4/30 through 5/1 with weakness and near syncope.  Troponin 80.  BNP 178.  CT head not acute.  CXR with stable cardiomegaly and very mild left basilar atelectasis.  D-dimer 1.99.  CTA chest without evidence of PE.  Symptoms improved with fluids, and presentation was felt to be in the setting of soft BP following escalation of GDMT.  He was advised to stop BiDil and losartan.  Lisinopril/HCTZ was also recommended to be held, though I do not know where this medication came from, as this medication was not on his discharge summary from the prior admission.  ? ?He comes in today doing reasonably well and is without symptoms of angina or decompensation.  His main complaint at this time is intermittent fatigue and shortness of breath.  He notes stable 2-4 pillow orthopnea.  No lower extremity swelling, abdominal distension, early satiety, or PND.  No further dizziness, presyncope, or syncope.  Blood pressure has been in the 262M systolic at home more recently.  He has been adherent to his cardiac medications.  No alcohol, tobacco, or  illicit substance use.  He remains out of work.  ? ? ?Labs independently reviewed: ?09/2021 - potassium 4.1, BUN 26, SCr 1.45, HGB 13.7, PLT 190 ?08/2021 - albumin 3.6, AST 44, ALT 59, magnesium 2.0, TC 140, TG 281, HDL 34, LDL 50 ?07/2021 - A1c 5.4, TSH 0.258 ? ?Past Medical History:  ?Diagnosis Date  ? CHF (congestive heart failure) ([place])   ? Chronic kidney disease    ? Crohn's disease ([place])   ? Hypertension   ? Proteinuria   ? ? ?Past Surgical History:  ?Procedure Laterality Date  ? COLECTOMY    ? RIGHT/LEFT HEART CATH AND CORONARY ANGIOGRAPHY N/A 08/07/2021  ? Procedure: RIGHT/LEFT HEART CATH AND CORONARY ANGIOGRAPHY;  Surgeon: Nelva Bush, MD;  Location: [facility];  Service: Cardiovascular;  Laterality: N/A;  ? ? ?Current Medications: ?Current Meds  ?Medication Sig  ? acetaminophen (TYLENOL) 325 MG tablet Take 975 mg by mouth every 6 (six) hours as needed for headache or mild pain.  ? aspirin 81 MG EC tablet Take 1 tablet (81 mg total) by mouth daily. Swallow whole.  ? atorvastatin (LIPITOR) 80 MG tablet Take 1 tablet (80 mg total) by mouth every evening.  ? carvedilol (COREG) 6.25 MG tablet Take 1 tablet (6.25 mg total) by mouth 2 (two) times daily with a meal.  ? clopidogrel (PLAVIX) 75 MG tablet Take 1 tablet (75 mg total) by mouth daily.  ? dapagliflozin propanediol (FARXIGA) 10 MG TABS tablet Take 1 tablet (10 mg total) by mouth daily.  ? Famotidine (PEPCID AC PO) Take by mouth as needed.  ? furosemide (LASIX) 40 MG tablet Take 0.5 tablets (20 mg total) by mouth every other day.  ? losartan (COZAAR) 25 MG tablet Take 1 tablet (25 mg total) by mouth daily.  ? ondansetron (ZOFRAN-ODT) 4 MG disintegrating tablet Take 4 mg by mouth every 8 (eight) hours as needed for nausea or vomiting.  ? [DISCONTINUED] spironolactone (ALDACTONE) 25 MG tablet Take 0.5 tablets (12.5 mg total) by mouth daily.  ? ? ?Allergies:   Ibuprofen, Neomycin-bacitracin zn-polymyx, and Sulfacetamide  ? ?Social History  ? ?Socioeconomic History  ? Marital status: Single  ?  Spouse name: Not on file  ? Number of children: Not on file  ? Years of education: Not on file  ? Highest education level: Not on file  ?Occupational History  ? Not on file  ?Tobacco Use  ? Smoking status: Former  ?  Types: Cigars  ?  Quit date: 08/13/2021  ?  Years since quitting: 0.1  ? Smokeless tobacco: Not on file   ?Vaping Use  ? Vaping Use: Never used  ?Substance and Sexual Activity  ? Alcohol use: Not Currently  ?  Alcohol/week: 1.0 standard drink  ?  Types: 1 Standard drinks or equivalent per week  ? Drug use: Never  ? Sexual activity: Not on file  ?Other Topics Concern  ? Not on file  ?Social History Narrative  ? Not on file  ? ?Social Determinants of Health  ? ?Financial Resource Strain: Not on file  ?Food Insecurity: Not on file  ?Transportation Needs: Not on file  ?Physical Activity: Not on file  ?Stress: Not on file  ?Social Connections: Not on file  ?  ? ?Family History:  ?The patient's family history includes Cancer in his maternal aunt; Heart disease in his maternal uncle; Pancreatic cancer in his maternal uncle. ? ?ROS:   ?12-point review of system is negative unless otherwise noted in the  HPI. ? ? ?EKGs/Labs/Other Studies Reviewed:   ? ?Studies reviewed were summarized above. The additional studies were reviewed today: ? ?R/LHC 08/07/2021: ?Conclusions: ?Diffuse moderate-severe, multivessel coronary artery disease, including chronic total occlusion of mid RCA, diffuse plaquing of LAD with up to 60% stenosis in the mid vessel, 60-70% proximal/mid LCx stenosis, and 90% lesion of OM1. ?Moderately elevated left heart filling pressures (LVEDP/PCWP 25 mmHg). ?Severely elevated right heart filling pressures (mean RA 16 mmHg, RVEDP 30 mmHg). ?Moderate-severe pulmonary hypertension (mean PAP 42 mmHg, PVR 4.0 WU). ?Moderately reduced Fick cardiac output/index (CO 4.2 L/min, CI 2.0 L/min/m^2).. ?  ?Recommendations: ?Suspect cardiomyopathy is a combination of ischemic and nonischemic etiologies confounded by significant pulmonary hypertension and right heart failure that may be due to underlying lung disease.  Recommend continued diuresis and optimization of GDMT for acute HFrEF.  In the setting of ongoing wheezing with concern for obstructive lung disease, I will hold carvedilol for now and add Entresto. ?Secondary  prevention of coronary artery disease, including high-intensity statin therapy.  Diffuse nature of CAD is not ideal for percutaneous or surgical revascularization.  Mild troponin elevation on admission is most likely

## 2021-10-01 ENCOUNTER — Telehealth: Payer: Self-pay | Admitting: *Deleted

## 2021-10-01 ENCOUNTER — Encounter: Payer: Self-pay | Admitting: Physician Assistant

## 2021-10-01 ENCOUNTER — Ambulatory Visit: Payer: Managed Care, Other (non HMO) | Admitting: Physician Assistant

## 2021-10-01 ENCOUNTER — Telehealth: Payer: Self-pay | Admitting: Internal Medicine

## 2021-10-01 ENCOUNTER — Other Ambulatory Visit
Admission: RE | Admit: 2021-10-01 | Discharge: 2021-10-01 | Disposition: A | Discharge status: Home / Self Care | Payer: Managed Care, Other (non HMO) | Source: Ambulatory Visit | Attending: Physician Assistant | Admitting: Physician Assistant

## 2021-10-01 ENCOUNTER — Encounter: Payer: Self-pay | Admitting: *Deleted

## 2021-10-01 VITALS — BP 130/100 | HR 72 | Ht 70.0 in | Wt 214.0 lb

## 2021-10-01 DIAGNOSIS — N182 Chronic kidney disease, stage 2 (mild): Secondary | ICD-10-CM | POA: Diagnosis not present

## 2021-10-01 DIAGNOSIS — R911 Solitary pulmonary nodule: Secondary | ICD-10-CM

## 2021-10-01 DIAGNOSIS — I10 Essential (primary) hypertension: Secondary | ICD-10-CM

## 2021-10-01 DIAGNOSIS — N183 Chronic kidney disease, stage 3 unspecified: Secondary | ICD-10-CM | POA: Diagnosis not present

## 2021-10-01 DIAGNOSIS — I5020 Unspecified systolic (congestive) heart failure: Secondary | ICD-10-CM | POA: Diagnosis not present

## 2021-10-01 DIAGNOSIS — I255 Ischemic cardiomyopathy: Secondary | ICD-10-CM | POA: Diagnosis not present

## 2021-10-01 DIAGNOSIS — E875 Hyperkalemia: Secondary | ICD-10-CM | POA: Insufficient documentation

## 2021-10-01 DIAGNOSIS — G4730 Sleep apnea, unspecified: Secondary | ICD-10-CM

## 2021-10-01 DIAGNOSIS — I428 Other cardiomyopathies: Secondary | ICD-10-CM | POA: Diagnosis not present

## 2021-10-01 DIAGNOSIS — I2510 Atherosclerotic heart disease of native coronary artery without angina pectoris: Secondary | ICD-10-CM

## 2021-10-01 DIAGNOSIS — E785 Hyperlipidemia, unspecified: Secondary | ICD-10-CM

## 2021-10-01 LAB — BASIC METABOLIC PANEL
Anion gap: 7 (ref 5–15)
BUN: 32 mg/dL — ABNORMAL HIGH (ref 6–20)
CO2: 26 mmol/L (ref 22–32)
Calcium: 9.8 mg/dL (ref 8.9–10.3)
Chloride: 110 mmol/L (ref 98–111)
Creatinine, Ser: 1.59 mg/dL — ABNORMAL HIGH (ref 0.61–1.24)
GFR, Estimated: 52 mL/min — ABNORMAL LOW (ref >60)
Glucose, Bld: 100 mg/dL — ABNORMAL HIGH (ref 70–99)
Potassium: 5.3 mmol/L — ABNORMAL HIGH (ref 3.5–5.1)
Sodium: 143 mmol/L (ref 135–145)

## 2021-10-01 MED ORDER — LOSARTAN POTASSIUM 25 MG PO TABS: 25.0000 mg | ORAL_TABLET | Freq: Every day | ORAL | 3 refills | Status: DC

## 2021-10-01 NOTE — Telephone Encounter (Signed)
Left voicemail message to call back for review of some changes that we need to make.  ?

## 2021-10-01 NOTE — Telephone Encounter (Signed)
Discussed with patient and provider. Patient should not be in clinical trial.  ? ?

## 2021-10-01 NOTE — Telephone Encounter (Signed)
-----   Message from Rise Mu, PA-C sent at 10/01/2021  3:40 PM EDT ----- ?Potassium again mildly elevated, though not as bad ?Renal function abnormal, though largely stable ? ?Recommendations: ?-Spironolactone was discontinued at his office visit today, he should continue to hold this medication, and I would not resume it moving forward given his tendency to develop hyperkalemia ?-I do not want him to start losartan yet, I want to repeat a potassium level early next week, and if his potassium is improved at that time, we can then start losartan with follow up labs ?

## 2021-10-01 NOTE — Telephone Encounter (Signed)
Patient came by office  ?Patient has been asked to start a clinic trial and would like to know if it is recommended by R. Dunn ?Please call to discuss  ?

## 2021-10-01 NOTE — Patient Instructions (Addendum)
Medication Instructions:  ?Your physician has recommended you make the following change in your medication:  ? ?STOP Spironolactone ?START Losartan 25 mg once daily  ? ?*If you need a refill on your cardiac medications before your next appointment, please call your pharmacy* ? ? ?Lab Work: ?BMET today over at the [facility] entrance. Check in at registration.  ? ?BMET in one week over at the [facility] entrance and check in at registration.  ? ? ?If you have labs (blood work) drawn today and your tests are completely normal, you will receive your results only by: ?MyChart Message (if you have MyChart) OR ?A paper copy in the mail ?If you have any lab test that is abnormal or we need to change your treatment, we will call you to review the results. ? ? ?Testing/Procedures: ?Your physician has requested that you have an echocardiogram. Echocardiography is a painless test that uses sound waves to create images of your heart. It provides your doctor with information about the size and shape of your heart and how well your heart?s chambers and valves are working. This procedure takes approximately one hour. There are no restrictions for this procedure. ? ? ? ?Follow-Up: ?At [facility], you and your health needs are our priority.  As part of our continuing mission to provide you with exceptional heart care, we have created designated Provider Care Teams.  These Care Teams include your primary Cardiologist (physician) and Advanced Practice Providers (APPs -  Physician Assistants and Nurse Practitioners) who all work together to provide you with the care you need, when you need it. ? ? ?Your next appointment:   ?1 month(s) ? ?The format for your next appointment:   ?In Person ? ?Provider:   ?Nelva Bush, MD or Christell Faith, PA-C  ? ? ? ? ?Important Information About Sugar ? ? ? ? ?  ?

## 2021-10-06 ENCOUNTER — Inpatient Hospital Stay
Admission: EM | Admit: 2021-10-06 | Discharge: 2021-10-11 | DRG: 305 | Disposition: A | Discharge status: Home / Self Care | Payer: Managed Care, Other (non HMO) | Attending: Internal Medicine | Admitting: Internal Medicine

## 2021-10-06 ENCOUNTER — Emergency Department: Payer: Managed Care, Other (non HMO)

## 2021-10-06 ENCOUNTER — Encounter: Payer: Self-pay | Admitting: Emergency Medicine

## 2021-10-06 DIAGNOSIS — N170 Acute kidney failure with tubular necrosis: Secondary | ICD-10-CM

## 2021-10-06 DIAGNOSIS — I160 Hypertensive urgency: Principal | ICD-10-CM

## 2021-10-06 DIAGNOSIS — Z7902 Long term (current) use of antithrombotics/antiplatelets: Secondary | ICD-10-CM

## 2021-10-06 DIAGNOSIS — R079 Chest pain, unspecified: Secondary | ICD-10-CM | POA: Diagnosis present

## 2021-10-06 DIAGNOSIS — N183 Chronic kidney disease, stage 3 unspecified: Secondary | ICD-10-CM | POA: Diagnosis present

## 2021-10-06 DIAGNOSIS — Z888 Allergy status to other drugs, medicaments and biological substances status: Secondary | ICD-10-CM

## 2021-10-06 DIAGNOSIS — I428 Other cardiomyopathies: Secondary | ICD-10-CM | POA: Diagnosis present

## 2021-10-06 DIAGNOSIS — Z79899 Other long term (current) drug therapy: Secondary | ICD-10-CM

## 2021-10-06 DIAGNOSIS — M25512 Pain in left shoulder: Secondary | ICD-10-CM | POA: Diagnosis present

## 2021-10-06 DIAGNOSIS — N179 Acute kidney failure, unspecified: Secondary | ICD-10-CM | POA: Diagnosis present

## 2021-10-06 DIAGNOSIS — Z881 Allergy status to other antibiotic agents status: Secondary | ICD-10-CM

## 2021-10-06 DIAGNOSIS — I2510 Atherosclerotic heart disease of native coronary artery without angina pectoris: Secondary | ICD-10-CM | POA: Diagnosis present

## 2021-10-06 DIAGNOSIS — I77810 Thoracic aortic ectasia: Secondary | ICD-10-CM | POA: Diagnosis present

## 2021-10-06 DIAGNOSIS — R778 Other specified abnormalities of plasma proteins: Secondary | ICD-10-CM

## 2021-10-06 DIAGNOSIS — I130 Hypertensive heart and chronic kidney disease with heart failure and stage 1 through stage 4 chronic kidney disease, or unspecified chronic kidney disease: Secondary | ICD-10-CM | POA: Diagnosis present

## 2021-10-06 DIAGNOSIS — I255 Ischemic cardiomyopathy: Secondary | ICD-10-CM | POA: Diagnosis present

## 2021-10-06 DIAGNOSIS — Z800 Family history of malignant neoplasm of digestive organs: Secondary | ICD-10-CM

## 2021-10-06 DIAGNOSIS — I5042 Chronic combined systolic (congestive) and diastolic (congestive) heart failure: Secondary | ICD-10-CM | POA: Diagnosis present

## 2021-10-06 DIAGNOSIS — I2729 Other secondary pulmonary hypertension: Secondary | ICD-10-CM | POA: Diagnosis present

## 2021-10-06 DIAGNOSIS — K5090 Crohn's disease, unspecified, without complications: Secondary | ICD-10-CM | POA: Diagnosis present

## 2021-10-06 DIAGNOSIS — K219 Gastro-esophageal reflux disease without esophagitis: Secondary | ICD-10-CM | POA: Diagnosis present

## 2021-10-06 DIAGNOSIS — Z87891 Personal history of nicotine dependence: Secondary | ICD-10-CM

## 2021-10-06 DIAGNOSIS — Z8249 Family history of ischemic heart disease and other diseases of the circulatory system: Secondary | ICD-10-CM

## 2021-10-06 DIAGNOSIS — E785 Hyperlipidemia, unspecified: Secondary | ICD-10-CM | POA: Diagnosis present

## 2021-10-06 DIAGNOSIS — Z7982 Long term (current) use of aspirin: Secondary | ICD-10-CM

## 2021-10-06 DIAGNOSIS — I248 Other forms of acute ischemic heart disease: Secondary | ICD-10-CM | POA: Diagnosis present

## 2021-10-06 DIAGNOSIS — I5022 Chronic systolic (congestive) heart failure: Secondary | ICD-10-CM

## 2021-10-06 LAB — CBC
HCT: 41 % (ref 39.0–52.0)
Hemoglobin: 14 g/dL (ref 13.0–17.0)
MCH: 27.8 pg (ref 26.0–34.0)
MCHC: 34.1 g/dL (ref 30.0–36.0)
MCV: 81.5 fL (ref 80.0–100.0)
Platelets: 166 10*3/uL (ref 150–400)
RBC: 5.03 MIL/uL (ref 4.22–5.81)
RDW: 13.9 % (ref 11.5–15.5)
WBC: 7.2 10*3/uL (ref 4.0–10.5)
nRBC: 0 % (ref 0.0–0.2)

## 2021-10-06 LAB — BASIC METABOLIC PANEL
Anion gap: 6 (ref 5–15)
BUN: 27 mg/dL — ABNORMAL HIGH (ref 6–20)
CO2: 26 mmol/L (ref 22–32)
Calcium: 9.5 mg/dL (ref 8.9–10.3)
Chloride: 109 mmol/L (ref 98–111)
Creatinine, Ser: 1.2 mg/dL (ref 0.61–1.24)
GFR, Estimated: >60 mL/min (ref >60)
Glucose, Bld: 111 mg/dL — ABNORMAL HIGH (ref 70–99)
Potassium: 4.1 mmol/L (ref 3.5–5.1)
Sodium: 141 mmol/L (ref 135–145)

## 2021-10-06 LAB — TROPONIN I (HIGH SENSITIVITY)
Troponin I (High Sensitivity): 46 ng/L — ABNORMAL HIGH (ref <18)
Troponin I (High Sensitivity): 81 ng/L — ABNORMAL HIGH (ref <18)

## 2021-10-06 MED ORDER — HEPARIN (PORCINE) 25000 UT/250ML-% IV SOLN
1300.0000 [IU]/h | INTRAVENOUS | Status: DC
  Administered 2021-10-07: 1300 [IU]/h via INTRAVENOUS
  Filled 2021-10-06: qty 250

## 2021-10-06 MED ORDER — HYDRALAZINE HCL 20 MG/ML IJ SOLN
10.0000 mg | Freq: Once | INTRAMUSCULAR | Status: AC
  Administered 2021-10-07: 10 mg via INTRAVENOUS
  Filled 2021-10-06: qty 1

## 2021-10-06 MED ORDER — ASPIRIN 81 MG PO CHEW
324.0000 mg | CHEWABLE_TABLET | Freq: Once | ORAL | Status: AC
  Administered 2021-10-07: 324 mg via ORAL
  Filled 2021-10-06: qty 4

## 2021-10-06 MED ORDER — NITROGLYCERIN 0.4 MG SL SUBL
0.4000 mg | SUBLINGUAL_TABLET | SUBLINGUAL | Status: AC | PRN
  Administered 2021-10-07 (×3): 0.4 mg via SUBLINGUAL
  Filled 2021-10-06: qty 1

## 2021-10-06 MED ORDER — HEPARIN BOLUS VIA INFUSION
4000.0000 [IU] | Freq: Once | INTRAVENOUS | Status: AC
  Administered 2021-10-07: 4000 [IU] via INTRAVENOUS
  Filled 2021-10-06: qty 4000

## 2021-10-06 NOTE — ED Provider Triage Note (Signed)
Emergency Medicine Provider Triage Evaluation Note  Clarence [name] , a 51 y.o. male  was evaluated in triage.  Pt complains of left-sided chest and shoulder pain described as sharp.  Patient has a history of hypertension, CHF.  No URI symptoms, GI symptoms.  Review of Systems  Positive: Left-sided chest pain Negative: URI symptoms, shortness of breath, GI symptoms  Physical Exam  BP (!) 210/129   Pulse 86   Temp 98.5 F (36.9 C) (Oral)   Resp 18   Ht 5' 10"  (1.778 m)   Wt 98 kg   SpO2 99%   BMI 31.00 kg/m  Gen:   Awake, no distress   Resp:  Normal effort  MSK:   Moves extremities without difficulty  Other:    Medical Decision Making  Medically screening exam initiated at 8:26 PM.  Appropriate orders placed.  Clarence [name] was informed that the remainder of the evaluation will be completed by another provider, this initial triage assessment does not replace that evaluation, and the importance of remaining in the ED until their evaluation is complete.  Patient presents with left-sided chest pain radiating to the shoulder.  Patient will have labs, EKG, chest x-ray.   Darletta Moll, PA-C 10/06/21 2026

## 2021-10-06 NOTE — ED Provider Notes (Signed)
[facility] Provider Note    Event Date/Time   First MD Initiated Contact with Patient 10/06/21 2338     (approximate)   History   Chest Pain   HPI  Clarence [name] is a 51 y.o. male with history of cardiomyopathy, coronary artery disease, hypertension, chronic kidney disease who presents to the emergency department with complaints of left-sided chest pressure that started today that radiated into his left arm.  Also has had shortness of breath.  No fevers, cough, lower extremity swelling.  Normally takes his blood pressure medications at night.  Reports compliance otherwise.  States his blood pressures have been running in the 768T to 157W systolic and usually in the 620 is diastolic despite compliance with his medications.   History provided by patient.    Past Medical History:  Diagnosis Date   CHF (congestive heart failure) (HCC)    Chronic kidney disease    Crohn's disease ([place])    Hypertension    Proteinuria     Past Surgical History:  Procedure Laterality Date   COLECTOMY     RIGHT/LEFT HEART CATH AND CORONARY ANGIOGRAPHY N/A 08/07/2021   Procedure: RIGHT/LEFT HEART CATH AND CORONARY ANGIOGRAPHY;  Surgeon: Nelva Bush, MD;  Location: [place] [facility];  Service: Cardiovascular;  Laterality: N/A;    MEDICATIONS:  Prior to Admission medications   Medication Sig Start Date End Date Taking? Authorizing Provider  acetaminophen (TYLENOL) 325 MG tablet Take 975 mg by mouth every 6 (six) hours as needed for headache or mild pain.    [provider]  aspirin 81 MG EC tablet Take 1 tablet (81 mg total) by mouth daily. Swallow whole. 09/11/21 10/11/21  Jennye Boroughs, MD  atorvastatin (LIPITOR) 80 MG tablet Take 1 tablet (80 mg total) by mouth every evening. 09/22/21 10/22/21  Rise Mu, PA-C  carvedilol (COREG) 6.25 MG tablet Take 1 tablet (6.25 mg total) by mouth 2 (two) times daily with a meal. 09/11/21   Jennye Boroughs, MD   clopidogrel (PLAVIX) 75 MG tablet Take 1 tablet (75 mg total) by mouth daily. 09/12/21   Jennye Boroughs, MD  dapagliflozin propanediol (FARXIGA) 10 MG TABS tablet Take 1 tablet (10 mg total) by mouth daily. 09/22/21 10/22/21  Rise Mu, PA-C  Famotidine (PEPCID AC PO) Take by mouth as needed.    [provider]  furosemide (LASIX) 40 MG tablet Take 0.5 tablets (20 mg total) by mouth every other day. 09/11/21 10/11/21  Jennye Boroughs, MD  losartan (COZAAR) 25 MG tablet Take 1 tablet (25 mg total) by mouth daily. 10/01/21 12/30/21  Rise Mu, PA-C  ondansetron (ZOFRAN-ODT) 4 MG disintegrating tablet Take 4 mg by mouth every 8 (eight) hours as needed for nausea or vomiting.    [provider]    Physical Exam   Triage Vital Signs: ED Triage Vitals  Enc Vitals Group     BP 10/06/21 2019 (!) 210/129     Pulse Rate 10/06/21 2019 86     Resp 10/06/21 2019 18     Temp 10/06/21 2019 98.5 F (36.9 C)     Temp Source 10/06/21 2019 Oral     SpO2 10/06/21 2019 99 %     Weight 10/06/21 2020 216 lb 0.8 oz (98 kg)     Height 10/06/21 2020 5' 10"  (1.778 m)     Head Circumference --      Peak Flow --      Pain Score 10/06/21  2020 8     Pain Loc --      Pain Edu? --      Excl. in [place]? --     Most recent vital signs: Vitals:   10/06/21 2235 10/06/21 2340  BP: (!) 184/112 (!) 207/112  Pulse: 66 77  Resp: 18 19  Temp:    SpO2: 100% 98%    CONSTITUTIONAL: Alert and oriented and responds appropriately to questions. Well-appearing; well-nourished HEAD: Normocephalic, atraumatic EYES: Conjunctivae clear, pupils appear equal, sclera nonicteric ENT: normal nose; moist mucous membranes NECK: Supple, normal ROM CARD: RRR; S1 and S2 appreciated; no murmurs, no clicks, no rubs, no gallops RESP: Normal chest excursion without splinting or tachypnea; breath sounds clear and equal bilaterally; no wheezes, no rhonchi, no rales, no hypoxia or respiratory distress, speaking full  sentences ABD/GI: Normal bowel sounds; non-distended; soft, non-tender, no rebound, no guarding, no peritoneal signs BACK: The back appears normal EXT: Normal ROM in all joints; no deformity noted, no edema; no cyanosis, no calf tenderness or calf swelling SKIN: Normal color for age and race; warm; no rash on exposed skin NEURO: Moves all extremities equally, normal speech PSYCH: The patient's mood and manner are appropriate.   ED Results / Procedures / Treatments   LABS: (all labs ordered are listed, but only abnormal results are displayed) Labs Reviewed  BASIC METABOLIC PANEL - Abnormal; Notable for the following components:      Result Value   Glucose, Bld 111 (*)    BUN 27 (*)    All other components within normal limits  TROPONIN I (HIGH SENSITIVITY) - Abnormal; Notable for the following components:   Troponin I (High Sensitivity) 46 (*)    All other components within normal limits  TROPONIN I (HIGH SENSITIVITY) - Abnormal; Notable for the following components:   Troponin I (High Sensitivity) 81 (*)    All other components within normal limits  CBC     EKG:  EKG Interpretation  Date/Time:  Monday Oct 06 2021 20:20:51 EDT Ventricular Rate:  87 PR Interval:  178 QRS Duration: 120 QT Interval:  404 QTC Calculation: 486 R Axis:   37 Text Interpretation: Normal sinus rhythm Possible Left atrial enlargement Left ventricular hypertrophy with QRS widening ( Cornell product ) Inferior infarct , age undetermined ST & T wave abnormality, consider lateral ischemia Abnormal ECG When compared with ECG of 14-Sep-2021 21:25, PREVIOUS ECG IS PRESENT No significant change since last tracing Confirmed by Pryor Curia [phone]) on 10/06/2021 11:41:31 PM         RADIOLOGY: My personal review and interpretation of imaging: Chest x-ray shows no pulmonary edema.  No widened mediastinum.  No cardiomegaly.  I have personally reviewed all radiology reports.   DG Chest 2 View  Result Date:  10/06/2021 CLINICAL DATA:  Chest pain. EXAM: CHEST - 2 VIEW COMPARISON:  Chest radiograph dated September 14, 2021 FINDINGS: The heart size and mediastinal contours are within normal limits. Both lungs are clear. The visualized skeletal structures are unremarkable. IMPRESSION: No active cardiopulmonary disease. Electronically Signed   By: Keane Police D.O.   On: 10/06/2021 20:51     PROCEDURES:  Critical Care performed: Yes, see critical care procedure note(s)   CRITICAL CARE Performed by: Cyril Mourning Valleri Hendricksen   Total critical care time: 45 minutes  Critical care time was exclusive of separately billable procedures and treating other patients.  Critical care was necessary to treat or prevent imminent or life-threatening deterioration.  Critical care was time spent  personally by me on the following activities: development of treatment plan with patient and/or surrogate as well as nursing, discussions with consultants, evaluation of patient's response to treatment, examination of patient, obtaining history from patient or surrogate, ordering and performing treatments and interventions, ordering and review of laboratory studies, ordering and review of radiographic studies, pulse oximetry and re-evaluation of patient's condition.   Marland Kitchen1-3 Lead EKG Interpretation Performed by: Alejandro Adcox, Delice Bison, DO Authorized by: Sion Reinders, Delice Bison, DO     Interpretation: normal     ECG rate:  77   ECG rate assessment: normal     Rhythm: sinus rhythm     Ectopy: none     Conduction: normal      IMPRESSION / MDM / ASSESSMENT AND PLAN / ED COURSE  I reviewed the triage vital signs and the nursing notes.    Patient here with complaints of chest pain, shortness of breath.  Blood pressure is extremely elevated.  Has significant cardiac history.  The patient is on the cardiac monitor to evaluate for evidence of arrhythmia and/or significant heart rate changes.   DIFFERENTIAL DIAGNOSIS (includes but not limited to):    ACS, CHF exacerbation, pneumothorax, pneumonia, hypertensive urgency versus emergency, PE, dissection   PLAN: We will obtain CBC, BMP, troponin x2, EKG, chest x-ray.  Will give aspirin, nitroglycerin, hydralazine.   MEDICATIONS GIVEN IN ED: Medications  aspirin chewable tablet 324 mg (has no administration in time range)  nitroGLYCERIN (NITROSTAT) SL tablet 0.4 mg (has no administration in time range)  hydrALAZINE (APRESOLINE) injection 10 mg (has no administration in time range)     ED COURSE: Patient's labs show improving, creatinine compared to previous.  Normal electrolytes, hemoglobin.  First troponin was 46 and now second has gone up to 81.  Chest x-ray reviewed and interpreted by myself and radiology and shows no pulmonary edema, cardiomegaly, widened mediastinum, pneumothorax.  Will discuss with hospitalist for admission for chest pain with elevated troponins, hypertensive urgency.   CONSULTS:  Consulted and discussed patient's case with hospitalist, Dr. Sidney Ace.  I have recommended admission and consulting physician agrees and will place admission orders.  Patient (and family if present) agree with this plan.   I reviewed all nursing notes, vitals, pertinent previous records.  All labs, EKGs, imaging ordered have been independently reviewed and interpreted by myself.    OUTSIDE RECORDS REVIEWED:    L[facility] 08/07/21:  Conclusions: Diffuse moderate-severe, multivessel coronary artery disease, including chronic total occlusion of mid RCA, diffuse plaquing of LAD with up to 60% stenosis in the mid vessel, 60-70% proximal/mid LCx stenosis, and 90% lesion of OM1. Moderately elevated left heart filling pressures (LVEDP/PCWP 25 mmHg). Severely elevated right heart filling pressures (mean RA 16 mmHg, RVEDP 30 mmHg). Moderate-severe pulmonary hypertension (mean PAP 42 mmHg, PVR 4.0 WU). Moderately reduced Fick cardiac output/index (CO 4.2 L/min, CI 2.0 L/min/m^2).Marland Kitchen    Recommendations: Suspect cardiomyopathy is a combination of ischemic and nonischemic etiologies confounded by significant pulmonary hypertension and right heart failure that may be due to underlying lung disease.  Recommend continued diuresis and optimization of GDMT for acute HFrEF.  In the setting of ongoing wheezing with concern for obstructive lung disease, I will hold carvedilol for now and add Entresto. Secondary prevention of coronary artery disease, including high-intensity statin therapy.  Diffuse nature of CAD is not ideal for percutaneous or surgical revascularization.  Mild troponin elevation on admission is most likely due to demand ischemia in the setting of acute HFrEF and chronic  ischemic heart disease.   Nelva Bush, MD [facility]          FINAL CLINICAL IMPRESSION(S) / ED DIAGNOSES   Final diagnoses:  Nonspecific chest pain  Elevated troponin  Hypertensive urgency     Rx / DC Orders   ED Discharge Orders     None        Note:  This document was prepared using Dragon voice recognition software and may include unintentional dictation errors.   Berley Gambrell, Delice Bison, DO 10/07/21 0007

## 2021-10-06 NOTE — ED Notes (Signed)
ED Provider at bedside. 

## 2021-10-06 NOTE — Progress Notes (Signed)
ANTICOAGULATION CONSULT NOTE - Initial Consult  Pharmacy Consult for Heparin  Indication: chest pain/ACS  Allergies  Allergen Reactions   Ibuprofen Other (See Comments) and Hives    Other reaction(s): GI Upset (intolerance) Hives    Neomycin-Bacitracin Zn-Polymyx     Other reaction(s): Other (See Comments)   Sulfacetamide Other (See Comments)    Patient Measurements: Height: 5' 10"  (177.8 cm) Weight: 98 kg (216 lb 0.8 oz) IBW/kg (Calculated) : 73 Heparin Dosing Weight: 93.3 kg   Vital Signs: Temp: 98.5 F (36.9 C) (05/22 2019) Temp Source: Oral (05/22 2019) BP: 207/112 (05/22 2340) Pulse Rate: 77 (05/22 2340)  Labs: Recent Labs    10/06/21 2017 10/06/21 2234  HGB 14.0  --   HCT 41.0  --   PLT 166  --   CREATININE 1.20  --   TROPONINIHS 46* 81*    Estimated Creatinine Clearance: 84.5 mL/min (by C-G formula based on SCr of 1.2 mg/dL).   Medical History: Past Medical History:  Diagnosis Date   CHF (congestive heart failure) ([place])    Chronic kidney disease    Crohn's disease ([place])    Hypertension    Proteinuria     Medications:  (Not in a hospital admission)   Assessment: Pharmacy consulted to dose heparin in this 51 year old male admitted with ACS/NSTEMI.  No prior anticoag noted.  CrCl = 84.5 ml/min  Goal of Therapy:  Heparin level 0.3-0.7 units/ml Monitor platelets by anticoagulation protocol: Yes   Plan:  Give 4000 units bolus x 1 Start heparin infusion at 1300 units/hr Check anti-Xa level in 6 hours and daily while on heparin Continue to monitor H&H and platelets  Alysia Scism D 10/06/2021,11:57 PM

## 2021-10-06 NOTE — ED Triage Notes (Signed)
Pt presents via POV with complaint of CP with associated left shoulder pain that started 20 mins. He describes his pain as 8/10 and "sharp/pressure".  Hx of HTN, CHF, & Cardiac cath in March 2023.

## 2021-10-06 NOTE — Telephone Encounter (Signed)
Left voicemail message for patient to call back for review of results and recommendations. Also sent my chart message with this information and requested he call back. Will monitor for review of that message and try to call again later.

## 2021-10-07 ENCOUNTER — Observation Stay (HOSPITAL_COMMUNITY)
Admit: 2021-10-07 | Discharge: 2021-10-07 | Disposition: A | Discharge status: Home / Self Care | Payer: Managed Care, Other (non HMO) | Attending: Family Medicine | Admitting: Family Medicine

## 2021-10-07 ENCOUNTER — Other Ambulatory Visit: Payer: Self-pay

## 2021-10-07 DIAGNOSIS — R778 Other specified abnormalities of plasma proteins: Secondary | ICD-10-CM | POA: Diagnosis present

## 2021-10-07 DIAGNOSIS — N179 Acute kidney failure, unspecified: Secondary | ICD-10-CM | POA: Diagnosis present

## 2021-10-07 DIAGNOSIS — R079 Chest pain, unspecified: Secondary | ICD-10-CM | POA: Diagnosis not present

## 2021-10-07 DIAGNOSIS — I77810 Thoracic aortic ectasia: Secondary | ICD-10-CM | POA: Diagnosis present

## 2021-10-07 DIAGNOSIS — I130 Hypertensive heart and chronic kidney disease with heart failure and stage 1 through stage 4 chronic kidney disease, or unspecified chronic kidney disease: Secondary | ICD-10-CM | POA: Diagnosis present

## 2021-10-07 DIAGNOSIS — R072 Precordial pain: Secondary | ICD-10-CM | POA: Diagnosis not present

## 2021-10-07 DIAGNOSIS — I160 Hypertensive urgency: Secondary | ICD-10-CM

## 2021-10-07 DIAGNOSIS — Z87891 Personal history of nicotine dependence: Secondary | ICD-10-CM | POA: Diagnosis not present

## 2021-10-07 DIAGNOSIS — I5022 Chronic systolic (congestive) heart failure: Secondary | ICD-10-CM

## 2021-10-07 DIAGNOSIS — Z800 Family history of malignant neoplasm of digestive organs: Secondary | ICD-10-CM | POA: Diagnosis not present

## 2021-10-07 DIAGNOSIS — I255 Ischemic cardiomyopathy: Secondary | ICD-10-CM | POA: Diagnosis present

## 2021-10-07 DIAGNOSIS — K5090 Crohn's disease, unspecified, without complications: Secondary | ICD-10-CM | POA: Diagnosis present

## 2021-10-07 DIAGNOSIS — Z7902 Long term (current) use of antithrombotics/antiplatelets: Secondary | ICD-10-CM | POA: Diagnosis not present

## 2021-10-07 DIAGNOSIS — Z79899 Other long term (current) drug therapy: Secondary | ICD-10-CM | POA: Diagnosis not present

## 2021-10-07 DIAGNOSIS — I248 Other forms of acute ischemic heart disease: Secondary | ICD-10-CM | POA: Diagnosis present

## 2021-10-07 DIAGNOSIS — K219 Gastro-esophageal reflux disease without esophagitis: Secondary | ICD-10-CM | POA: Diagnosis present

## 2021-10-07 DIAGNOSIS — I161 Hypertensive emergency: Secondary | ICD-10-CM

## 2021-10-07 DIAGNOSIS — N183 Chronic kidney disease, stage 3 unspecified: Secondary | ICD-10-CM | POA: Diagnosis present

## 2021-10-07 DIAGNOSIS — M25512 Pain in left shoulder: Secondary | ICD-10-CM | POA: Diagnosis present

## 2021-10-07 DIAGNOSIS — I428 Other cardiomyopathies: Secondary | ICD-10-CM | POA: Diagnosis present

## 2021-10-07 DIAGNOSIS — Z8249 Family history of ischemic heart disease and other diseases of the circulatory system: Secondary | ICD-10-CM | POA: Diagnosis not present

## 2021-10-07 DIAGNOSIS — I5042 Chronic combined systolic (congestive) and diastolic (congestive) heart failure: Secondary | ICD-10-CM | POA: Diagnosis present

## 2021-10-07 DIAGNOSIS — I2510 Atherosclerotic heart disease of native coronary artery without angina pectoris: Secondary | ICD-10-CM | POA: Diagnosis present

## 2021-10-07 DIAGNOSIS — Z881 Allergy status to other antibiotic agents status: Secondary | ICD-10-CM | POA: Diagnosis not present

## 2021-10-07 DIAGNOSIS — I249 Acute ischemic heart disease, unspecified: Secondary | ICD-10-CM

## 2021-10-07 DIAGNOSIS — E785 Hyperlipidemia, unspecified: Secondary | ICD-10-CM | POA: Diagnosis present

## 2021-10-07 DIAGNOSIS — Z7982 Long term (current) use of aspirin: Secondary | ICD-10-CM | POA: Diagnosis not present

## 2021-10-07 DIAGNOSIS — Z888 Allergy status to other drugs, medicaments and biological substances status: Secondary | ICD-10-CM | POA: Diagnosis not present

## 2021-10-07 DIAGNOSIS — I2729 Other secondary pulmonary hypertension: Secondary | ICD-10-CM | POA: Diagnosis present

## 2021-10-07 LAB — ECHOCARDIOGRAM COMPLETE
AR max vel: 2.76 cm2
AV Area VTI: 3.17 cm2
AV Area mean vel: 2.19 cm2
AV Mean grad: 3 mmHg
AV Peak grad: 4.8 mmHg
Ao pk vel: 1.1 m/s
Area-P 1/2: 4.29 cm2
Height: 70 in
MV VTI: 2.84 cm2
S' Lateral: 4.44 cm
Weight: 3456.81 oz

## 2021-10-07 LAB — CBC
HCT: 39.3 % (ref 39.0–52.0)
Hemoglobin: 13.6 g/dL (ref 13.0–17.0)
MCH: 28 pg (ref 26.0–34.0)
MCHC: 34.6 g/dL (ref 30.0–36.0)
MCV: 81 fL (ref 80.0–100.0)
Platelets: 175 10*3/uL (ref 150–400)
RBC: 4.85 MIL/uL (ref 4.22–5.81)
RDW: 14.3 % (ref 11.5–15.5)
WBC: 8.1 10*3/uL (ref 4.0–10.5)
nRBC: 0 % (ref 0.0–0.2)

## 2021-10-07 LAB — LIPID PANEL
Cholesterol: 149 mg/dL (ref 0–200)
HDL: 30 mg/dL — ABNORMAL LOW (ref >40)
LDL Cholesterol: 43 mg/dL (ref 0–99)
Total CHOL/HDL Ratio: 5 RATIO
Triglycerides: 382 mg/dL — ABNORMAL HIGH (ref <150)
VLDL: 76 mg/dL — ABNORMAL HIGH (ref 0–40)

## 2021-10-07 LAB — BASIC METABOLIC PANEL
Anion gap: 7 (ref 5–15)
BUN: 22 mg/dL — ABNORMAL HIGH (ref 6–20)
CO2: 25 mmol/L (ref 22–32)
Calcium: 9.3 mg/dL (ref 8.9–10.3)
Chloride: 108 mmol/L (ref 98–111)
Creatinine, Ser: 1.03 mg/dL (ref 0.61–1.24)
GFR, Estimated: >60 mL/min (ref >60)
Glucose, Bld: 100 mg/dL — ABNORMAL HIGH (ref 70–99)
Potassium: 3.5 mmol/L (ref 3.5–5.1)
Sodium: 140 mmol/L (ref 135–145)

## 2021-10-07 LAB — PROTIME-INR
INR: 1 (ref 0.8–1.2)
INR: 1.1 (ref 0.8–1.2)
Prothrombin Time: 13.6 seconds (ref 11.4–15.2)
Prothrombin Time: 13.7 seconds (ref 11.4–15.2)

## 2021-10-07 LAB — TROPONIN I (HIGH SENSITIVITY)
Troponin I (High Sensitivity): 290 ng/L (ref <18)
Troponin I (High Sensitivity): 221 ng/L (ref <18)

## 2021-10-07 LAB — APTT: aPTT: 32 seconds (ref 24–36)

## 2021-10-07 LAB — HEPARIN LEVEL (UNFRACTIONATED): Heparin Unfractionated: 0.45 IU/mL (ref 0.30–0.70)

## 2021-10-07 MED ORDER — ATORVASTATIN CALCIUM 80 MG PO TABS
80.0000 mg | ORAL_TABLET | Freq: Every day | ORAL | Status: DC
  Administered 2021-10-07 – 2021-10-11 (×5): 80 mg via ORAL
  Filled 2021-10-07 (×2): qty 1
  Filled 2021-10-07: qty 4
  Filled 2021-10-07 (×2): qty 1

## 2021-10-07 MED ORDER — ASPIRIN 81 MG PO TBEC
81.0000 mg | DELAYED_RELEASE_TABLET | Freq: Every day | ORAL | Status: DC
  Administered 2021-10-08 – 2021-10-11 (×4): 81 mg via ORAL
  Filled 2021-10-07 (×4): qty 1

## 2021-10-07 MED ORDER — NITROGLYCERIN 0.4 MG SL SUBL
0.4000 mg | SUBLINGUAL_TABLET | SUBLINGUAL | Status: DC | PRN
  Filled 2021-10-07 (×2): qty 1

## 2021-10-07 MED ORDER — FUROSEMIDE 10 MG/ML IJ SOLN
20.0000 mg | Freq: Every day | INTRAMUSCULAR | Status: DC
  Administered 2021-10-08: 20 mg via INTRAVENOUS
  Filled 2021-10-07: qty 2

## 2021-10-07 MED ORDER — ONDANSETRON HCL 4 MG/2ML IJ SOLN: 4.0000 mg | Freq: Four times a day (QID) | INTRAMUSCULAR | Status: DC | PRN

## 2021-10-07 MED ORDER — ASPIRIN 300 MG RE SUPP: 300.0000 mg | RECTAL | Status: DC

## 2021-10-07 MED ORDER — MORPHINE SULFATE (PF) 2 MG/ML IV SOLN: 2.0000 mg | INTRAVENOUS | Status: DC | PRN

## 2021-10-07 MED ORDER — ASPIRIN 81 MG PO CHEW: 324.0000 mg | CHEWABLE_TABLET | ORAL | Status: DC

## 2021-10-07 MED ORDER — FUROSEMIDE 40 MG PO TABS
20.0000 mg | ORAL_TABLET | ORAL | Status: DC
  Administered 2021-10-07: 20 mg via ORAL
  Filled 2021-10-07: qty 1

## 2021-10-07 MED ORDER — LOSARTAN POTASSIUM 25 MG PO TABS
25.0000 mg | ORAL_TABLET | Freq: Every day | ORAL | Status: DC
  Administered 2021-10-07 – 2021-10-11 (×5): 25 mg via ORAL
  Filled 2021-10-07 (×5): qty 1

## 2021-10-07 MED ORDER — NITROGLYCERIN 2 % TD OINT
1.0000 [in_us] | TOPICAL_OINTMENT | Freq: Four times a day (QID) | TRANSDERMAL | Status: DC
  Administered 2021-10-07: 1 [in_us] via TOPICAL
  Filled 2021-10-07: qty 1

## 2021-10-07 MED ORDER — ALPRAZOLAM 0.25 MG PO TABS
0.2500 mg | ORAL_TABLET | Freq: Two times a day (BID) | ORAL | Status: DC | PRN
  Administered 2021-10-07: 0.25 mg via ORAL
  Filled 2021-10-07: qty 1

## 2021-10-07 MED ORDER — ISOSORBIDE MONONITRATE ER 30 MG PO TB24
30.0000 mg | ORAL_TABLET | Freq: Every day | ORAL | Status: DC
  Administered 2021-10-07 – 2021-10-11 (×5): 30 mg via ORAL
  Filled 2021-10-07 (×6): qty 1

## 2021-10-07 MED ORDER — CARVEDILOL 6.25 MG PO TABS
6.2500 mg | ORAL_TABLET | Freq: Two times a day (BID) | ORAL | Status: DC
  Administered 2021-10-07 – 2021-10-11 (×9): 6.25 mg via ORAL
  Filled 2021-10-07 (×9): qty 1

## 2021-10-07 MED ORDER — ACETAMINOPHEN 325 MG PO TABS
650.0000 mg | ORAL_TABLET | ORAL | Status: DC | PRN
  Administered 2021-10-09 – 2021-10-10 (×3): 650 mg via ORAL
  Filled 2021-10-07 (×3): qty 2

## 2021-10-07 NOTE — ED Notes (Signed)
Pt ambulatory to restroom. Pt complains of 2/10 chest tightness. ECG done but pt wants to wait on nitro.

## 2021-10-07 NOTE — Telephone Encounter (Signed)
Left voicemail message for patient to call back for review of results and recommendations. This is attempt #3 and unable to reach him. Sending letter with results and recommendations and to call us with any questions.

## 2021-10-07 NOTE — Progress Notes (Signed)
Admission profile updated. ?

## 2021-10-07 NOTE — Assessment & Plan Note (Addendum)
-   This is associated with elevated troponin I and is concerning for acute coronary syndrome. - The patient will be admitted to a cardiac telemetry bed. - We will follow serial troponins. - He will be placed on as needed sublingual nitroglycerin and IV morphine sulfate for pain. - We will continue aspirin and Plavix. - We will place him on high-dose statin therapy and continue beta-blocker therapy.

## 2021-10-07 NOTE — ED Notes (Signed)
Informed RN bed assigned 

## 2021-10-07 NOTE — Telephone Encounter (Signed)
Patient currently admitted and message received from provider that we will follow during his admission.

## 2021-10-07 NOTE — Progress Notes (Signed)
No charge progress note.  Clarence [name] is a 51 y.o. male with medical history significant for Crohn's disease, hypertension, dyslipidemia, combined systolic and diastolic CHF, GERD and chronic kidney disease, presented to the emergency room with acute onset of left shoulder pain followed by midsternal chest pain felt as tightness, moderate in intensity with associated radiation to the left arm with numbness.  He had associated diaphoresis with no nausea or vomiting.  He denied any cough or wheezing or hemoptysis.  No fever or chills.  No dysuria, oliguria, hematuria urgency or frequency or flank pain.  Patient was doing groceries when the pain started.  He checked his blood pressure on returning home and it was elevated above 200.  As pain continues he called the EMS.  On arrival to ED he was having elevated blood pressure at 210/129.  Mildly elevated BUN at 27.  EKG with sinus rhythm, borderline LVH, inferior infarct lateral T wave inversions with nonspecific ST segment changes. Troponin elevated which peaked at 290. Repeat echocardiogram with EF of 35 to 40% with global hypokinesis and akinesis of the inferior/inferolateral segments.  Patient received 4 tablets of baby aspirin, 1 dose of IV hydralazine and sublingual nitroglycerin followed by nitroglycerin patch.  Was also started on IV heparin infusion.  Cardiology was consulted this morning. Per cardiology elevated troponin most likely secondary to demand ischemia with hypertensive urgency.  Clinically appears euvolemic but weight appears to be trending up. They recommend restarting home carvedilol and losartan and also added isosorbide mononitrate 30 mg daily. They were also recommending renal Doppler to rule out renal artery stenosis. Because of upward trending of his weight  (210 on 09/09/2021, 214 on 10/01/2021, and 216 yesterday) they recommended gentle diuresis and added Lasix 20 mg IV daily. Patient will need monitoring of renal  function closely. No plan for any repeat catheterization and they also recommend discontinuation of heparin.  Patient continues to have some chest discomfort when seen during morning rounds, stating that it is much eased off as compared to prior.  We will continue to monitor today and if remains stable can be discharged home tomorrow with a close cardiology follow-up.

## 2021-10-07 NOTE — Assessment & Plan Note (Signed)
-   He has combined systolic and diastolic CHF without current exacerbation. - We will continue Cozaar and Lasix, beta-blocker therapy  as well as [place].

## 2021-10-07 NOTE — Consult Note (Signed)
Cardiology Consultation:   Patient ID: Clarence [name] MRN: [id number]; DOB: 1970/12/17  Admit date: 10/06/2021 Date of Consult: 10/07/2021  PCP:  Merryl Hacker No   [facility] Providers Cardiologist:  Nelva Bush, MD   {  Patient Profile:   Clarence [name] is a 51 y.o. 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 Reesa Chew.  History of Present Illness:   [name] is followed by Dr. Saunders Revel for the above cardiac issues. He was admitted in 07/2021 with progressive dyspnea over the preceding several months and was found to have a new CM and atypical PNA. HS troponin was 89 with BNP 559. Echo showed LVEF 30-35%, global HK, moderate LVH, G2DD, normal RVSF pressure 15 mmHg. He was diuresed and GDMT was initiated with symptomatic improvement. Subsequent R/LHC showed diffuse moderate to severe multivessel CAD including CTA of the mid RCA, diffuse plaquing of LAD with up to 60% stenosis in the mid vessel, 60-70% proximal/mid Lcx stenosis, and 90% stenosis of OM1. Moderately elevated left heart filling pressure, severely elevated right heart filling pressures, moderate to severe pulmonary hypertension, moderately reduced cardiac output as outlined below. It was suspected his cardiomyopathy was a combination of ischemic and NICM confounded by her significant pulmonary HTN and right heart failure that may have been due to underlying pulmonary disease. He was seen in hospital follow-up on 08/29/21 noting fatigue and an episode of near syncope. BP was soft 98/76 at that time. Labs showed acute on chronic CKD and hyperkalemia with potassium of 6.0, leading him to be evaluated in the ED 4/14 with persistent hyperkalemia and AKI. He was treated with IVF and Lokelma.   He was admitted to the hospital 4/25-4/27 with acute on  chronic HFrEF secondary to accelerated hypertension. BNP 178. HS trop peaked at 139 and felt to be demad ischemia. GDMT was escalated in addition of Losartan and continuation of Coreg, Bidil, Farxiga, spironolactone and Lasix.   He was seen in the ED on 4/30 through 5/1 with weakness and near syncope. HS trop 80, BNP 178. CT head non-acute. CXR with stable cardiomegaly and very mild left basular atelectasis. Ddimer 1.99. CTA  chest without evidence of PE. Symptoms improved with IVF and presentation was felt to be in the setting of soft BP following escalation of GDMT. HE was advised to stop Bidil and Losartan.   Seen in the office 10/01/21 and was doing reasonably well. He was started on Losartan. Arlyce Harman was stopped with prior issues of hyperkalemia.   The patient presented to the ER 10/06/21 for left arm pain and chest tightness. Last night around 8pm he was walking to the store when he started feeling sharp left shoulder pain that radiated into the center of his chest. It felt like a tightness and could feel his heart pounding. He denies associated symptoms. He went home and checked his BP and SBP>200 and EMS was called. Reports compliance with all his heart medications, he takes most meds at night. He denies significant SOB, orthopnea, pnd or LLE.   In the ER BP 210/129, pulse 86bpm, afebrile, RR 18, 99% O2. Labs showed HS trop [phone]. K 3.5, NA 140, Scr 1.03, BUN 22, wBC 7.2, Hgb 14.0. CXR non-acute. He was given Aspirin 368m, NTG SL x1 and IV hydralazine 126mdaily.EKG showed  NSR with IVCD, minimal STE inf leads with TWI II and V6, no significant changes. The patient was admitted for further work-up.    Past Medical History:  Diagnosis Date   CHF (congestive heart failure) (HCC)    Chronic kidney disease    Crohn's disease ([place])    Hypertension    Proteinuria     Past Surgical History:  Procedure Laterality Date   COLECTOMY     RIGHT/LEFT HEART CATH AND CORONARY ANGIOGRAPHY N/A  08/07/2021   Procedure: RIGHT/LEFT HEART CATH AND CORONARY ANGIOGRAPHY;  Surgeon: Nelva Bush, MD;  Location: [facility];  Service: Cardiovascular;  Laterality: N/A;     Home Medications:  Prior to Admission medications   Medication Sig Start Date End Date Taking? Authorizing Provider  acetaminophen (TYLENOL) 325 MG tablet Take 975 mg by mouth every 6 (six) hours as needed for headache or mild pain.   Yes [provider]  aspirin 81 MG EC tablet Take 1 tablet (81 mg total) by mouth daily. Swallow whole. 09/11/21 10/11/21 Yes Jennye Boroughs, MD  atorvastatin (LIPITOR) 80 MG tablet Take 1 tablet (80 mg total) by mouth every evening. 09/22/21 10/22/21 Yes Dunn, Areta Haber, PA-C  carvedilol (COREG) 6.25 MG tablet Take 1 tablet (6.25 mg total) by mouth 2 (two) times daily with a meal. 09/11/21  Yes Jennye Boroughs, MD  clopidogrel (PLAVIX) 75 MG tablet Take 1 tablet (75 mg total) by mouth daily. 09/12/21  Yes Jennye Boroughs, MD  dapagliflozin propanediol (FARXIGA) 10 MG TABS tablet Take 1 tablet (10 mg total) by mouth daily. 09/22/21 10/22/21 Yes Dunn, Areta Haber, PA-C  Famotidine (PEPCID AC PO) Take by mouth as needed.   Yes [provider]  furosemide (LASIX) 40 MG tablet Take 0.5 tablets (20 mg total) by mouth every other day. 09/11/21 10/11/21 Yes Jennye Boroughs, MD  losartan (COZAAR) 25 MG tablet Take 1 tablet (25 mg total) by mouth daily. 10/01/21 12/30/21 Yes Dunn, Areta Haber, PA-C  ondansetron (ZOFRAN-ODT) 4 MG disintegrating tablet Take 4 mg by mouth every 8 (eight) hours as needed for nausea or vomiting.   Yes [provider]    Inpatient Medications: Scheduled Meds:  [START ON 10/08/2021] aspirin EC  81 mg Oral Daily   atorvastatin  80 mg Oral Daily   nitroGLYCERIN  1 inch Topical Q6H   Continuous Infusions:  heparin 1,300 Units/hr (10/07/21 0033)   PRN Meds: acetaminophen, ALPRAZolam, morphine injection, nitroGLYCERIN, ondansetron (ZOFRAN) IV  Allergies:    Allergies   Allergen Reactions   Ibuprofen Other (See Comments) and Hives    Other reaction(s): GI Upset (intolerance) Hives    Neomycin-Bacitracin Zn-Polymyx     Other reaction(s): Other (See Comments)   Sulfacetamide Other (See Comments)    Social History:   Social History   Socioeconomic History   Marital status: Single    Spouse name: Not on file   Number of children: Not on file   Years of education: Not on file   Highest education level: Not on file  Occupational History   Not on file  Tobacco Use   Smoking status: Former    Types: Cigars    Quit date: 08/13/2021    Years since quitting: 0.1   Smokeless tobacco: Not on file  Vaping Use   Vaping Use: Never used  Substance and Sexual Activity   Alcohol use: Not Currently    Alcohol/week: 1.0 standard drink    Types: 1 Standard drinks or equivalent per week  Drug use: Never   Sexual activity: Not on file  Other Topics Concern   Not on file  Social History Narrative   Not on file   Social Determinants of Health   Financial Resource Strain: Not on file  Food Insecurity: Not on file  Transportation Needs: Not on file  Physical Activity: Not on file  Stress: Not on file  Social Connections: Not on file  Intimate Partner Violence: Not on file    Family History:    Family History  Problem Relation Age of Onset   Heart disease Maternal Uncle    Pancreatic cancer Maternal Uncle    Cancer Maternal Aunt      ROS:  Please see the history of present illness.   All other ROS reviewed and negative.     Physical Exam/Data:   Vitals:   10/07/21 0300 10/07/21 0407 10/07/21 0500 10/07/21 0619  BP: (!) 176/105 (!) 181/105 (!) 165/111 (!) 140/97  Pulse: 75 68 78 79  Resp: (!) 25 20 17 18   Temp:      TempSrc:      SpO2: 97% 99% 97% 96%  Weight:      Height:       No intake or output data in the 24 hours ending 10/07/21 0931    10/06/2021    8:20 PM 10/01/2021    1:39 PM 09/14/2021    9:01 PM  Last 3 Weights   Weight (lbs) 216 lb 0.8 oz 214 lb 210 lb 5.1 oz  Weight (kg) 98 kg 97.07 kg 95.4 kg     Body mass index is 31 kg/m.  General:  Well nourished, well developed, in no acute distress HEENT: normal Neck: no JVD Vascular: No carotid bruits; Distal pulses 2+ bilaterally Cardiac:  normal S1, S2; RRR; no murmur  Lungs:  clear to auscultation bilaterally, no wheezing, rhonchi or rales  Abd: soft, nontender, no hepatomegaly  Ext: no edema Musculoskeletal:  No deformities, BUE and BLE strength normal and equal Skin: warm and dry  Neuro:  CNs 2-12 intact, no focal abnormalities noted Psych:  Normal affect   EKG:  The EKG was personally reviewed and demonstrates:  NSR, 87bpm, min ST dep inf leads, TWI V5 V^ and II, no significant change Telemetry:  Telemetry was personally reviewed and demonstrates:  NSR HR 60-70s  Relevant CV Studies:  Echo 10/06/21  1. Left ventricular ejection fraction, by estimation, is 35 to 40%. The  left ventricle has moderately decreased function. The left ventricle  demonstrates global hypokinesis. There is moderate left ventricular  hypertrophy. Left ventricular diastolic  parameters are consistent with Grade I diastolic dysfunction (impaired  relaxation).   2. Right ventricular systolic function is normal. The right ventricular  size is normal.   3. Right atrial size was mildly dilated.   4. The mitral valve is normal in structure. Mild mitral valve  regurgitation. No evidence of mitral stenosis.   5. The aortic valve is tricuspid. Aortic valve regurgitation is not  visualized. No aortic stenosis is present.   6. There is mild dilatation of the ascending aorta, measuring 40 mm.   R/L Cardiac cath 08/07/21 Conclusions: Diffuse moderate-severe, multivessel coronary artery disease, including chronic total occlusion of mid RCA, diffuse plaquing of LAD with up to 60% stenosis in the mid vessel, 60-70% proximal/mid LCx stenosis, and 90% lesion of OM1. Moderately  elevated left heart filling pressures (LVEDP/PCWP 25 mmHg). Severely elevated right heart filling pressures (mean RA 16 mmHg, RVEDP 30  mmHg). Moderate-severe pulmonary hypertension (mean PAP 42 mmHg, PVR 4.0 WU). Moderately reduced Fick cardiac output/index (CO 4.2 L/min, CI 2.0 L/min/m^2).Marland Kitchen   Recommendations: Suspect cardiomyopathy is a combination of ischemic and nonischemic etiologies confounded by significant pulmonary hypertension and right heart failure that may be due to underlying lung disease.  Recommend continued diuresis and optimization of GDMT for acute HFrEF.  In the setting of ongoing wheezing with concern for obstructive lung disease, I will hold carvedilol for now and add Entresto. Secondary prevention of coronary artery disease, including high-intensity statin therapy.  Diffuse nature of CAD is not ideal for percutaneous or surgical revascularization.  Mild troponin elevation on admission is most likely due to demand ischemia in the setting of acute HFrEF and chronic ischemic heart disease.   Nelva Bush, MD [facility]  Echo 08/05/21  1. Left ventricular ejection fraction, by estimation, is 30 to 35%. The  left ventricle has moderately decreased function. The left ventricle  demonstrates global hypokinesis. There is moderate left ventricular  hypertrophy. Left ventricular diastolic  parameters are consistent with Grade II diastolic dysfunction  (pseudonormalization).   2. Right ventricular systolic function is normal. The right ventricular  size is normal. Tricuspid regurgitation signal is inadequate for assessing  PA pressure.   3. Left atrial size was mildly dilated.   4. The mitral valve is abnormal. Mild to moderate mitral valve  regurgitation. No evidence of mitral stenosis.   5. The aortic valve has an indeterminant number of cusps. Aortic valve  regurgitation is not visualized. No aortic stenosis is present.   6. The inferior vena cava is dilated in size  with <50% respiratory  variability, suggesting right atrial pressure of 15 mmHg.   Laboratory Data:  High Sensitivity Troponin:   Recent Labs  Lab 09/14/21 2104 10/06/21 2017 10/06/21 2234 10/07/21 0309 10/07/21 0659  TROPONINIHS 80* 46* 81* 290* 221*     Chemistry Recent Labs  Lab 10/01/21 1506 10/06/21 2017 10/07/21 0309  NA 143 141 140  K 5.3* 4.1 3.5  CL 110 109 108  CO2 26 26 25   GLUCOSE 100* 111* 100*  BUN 32* 27* 22*  CREATININE 1.59* 1.20 1.03  CALCIUM 9.8 9.5 9.3  GFRNONAA 52* >60 >60  ANIONGAP 7 6 7     No results for input(s): PROT, ALBUMIN, AST, ALT, ALKPHOS, BILITOT in the last 168 hours. Lipids  Recent Labs  Lab 10/07/21 0309  CHOL 149  TRIG 382*  HDL 30*  LDLCALC 43  CHOLHDL 5.0    Hematology Recent Labs  Lab 10/06/21 2017  WBC 7.2  RBC 5.03  HGB 14.0  HCT 41.0  MCV 81.5  MCH 27.8  MCHC 34.1  RDW 13.9  PLT 166   Thyroid No results for input(s): TSH, FREET4 in the last 168 hours.  BNPNo results for input(s): BNP, PROBNP in the last 168 hours.  DDimer No results for input(s): DDIMER in the last 168 hours.   Radiology/Studies:  DG Chest 2 View  Result Date: 10/06/2021 CLINICAL DATA:  Chest pain. EXAM: CHEST - 2 VIEW COMPARISON:  Chest radiograph dated September 14, 2021 FINDINGS: The heart size and mediastinal contours are within normal limits. Both lungs are clear. The visualized skeletal structures are unremarkable. IMPRESSION: No active cardiopulmonary disease. Electronically Signed   By: Keane Police D.O.   On: 10/06/2021 20:51     Assessment and Plan:   Chest pain Hypertensive Urgency - chest pain in the setting of severely elevated BP with BP >  200/100 - given ASA 345m, IV hydralazine, SL NTG and nitro paste - patient reports improvement of chest pain - HS troponin peak 290 with down trend - BP has been hard to control in the outpatient setting - restart home Losartan 259mdaily, Coreg 6.2562mID - stop the NTG paste - start  Imdur 25m16mily  Elevated troponin CAD - HS trop peak 290 with downward trend - EKG with no changes - LHC 07/2021 CTO of the RCA with otherwise nonobstructive disease as outlined above, plan for medical management - started on IV heparin - suspect demand ischemia in the setting of severely elevated BP, can likely d/c IV heparin - continue Aspirin, Plavix, statin, and BB therapy  HFrEF Mixed ICM/NICM Pulmonary HTN - Echo 30-35%, G2DD, mild to mod MR - reports minimal weight gain over the last month - PTA lasix 20mg64mry other day - appears relatively euvolemic - PTA Coreg 6.25mgB23mLosartan 25mg d86m, Farxiga 10mg da31m- no spiro with h/o hyperkalemia - GDMT limited as outpatient was limited for soft Bps and CKD - restart Losartan and Coreg as above - restart home lasix, may benefit from one IV lasix dose - Echo this admission showed LVEF 35-40%, G1DD, mildly dilated RA, mild MR, mild ascending aortic dilation, measuring 40mm  CK45mage 23  - daily BMET  For questions or updates, please contact [facility] Please consult [email or web address] for contact info under    Signed, Maryama Kuriakose H Gaelle Adriance, Ninfa Meeker/23/2023 9:31 AM

## 2021-10-07 NOTE — Progress Notes (Signed)
   Pharmacy Consult for Heparin  Indication: chest pain/ACS  Allergies  Allergen Reactions   Ibuprofen Other (See Comments) and Hives    Other reaction(s): GI Upset (intolerance) Hives    Neomycin-Bacitracin Zn-Polymyx     Other reaction(s): Other (See Comments)   Sulfacetamide Other (See Comments)    Patient Measurements: Height: 5' 10"  (177.8 cm) Weight: 98 kg (216 lb 0.8 oz) IBW/kg (Calculated) : 73 Heparin Dosing Weight: 93.3 kg   Vital Signs: BP: 140/97 (05/23 0619) Pulse Rate: 79 (05/23 0619)  Labs: Recent Labs    10/06/21 2017 10/06/21 2234 10/07/21 0001 10/07/21 0309 10/07/21 0659  HGB 14.0  --   --   --   --   HCT 41.0  --   --   --   --   PLT 166  --   --   --   --   APTT  --   --  32  --   --   LABPROT  --   --  13.6 13.7  --   INR  --   --  1.0 1.1  --   HEPARINUNFRC  --   --   --   --  0.45  CREATININE 1.20  --   --  1.03  --   TROPONINIHS 46* 81*  --  290* 221*     Estimated Creatinine Clearance: 98.5 mL/min (by C-G formula based on SCr of 1.03 mg/dL).   Medical History: Past Medical History:  Diagnosis Date   CHF (congestive heart failure) ([place])    Chronic kidney disease    Crohn's disease ([place])    Hypertension    Proteinuria     Medications:  (Not in a hospital admission)  Assessment: Pharmacy consulted to dose heparin in this 51 year old male admitted with ACS/NSTEMI.  No prior anticoag noted.  CrCl = 84.5 ml/min  5/23 0659 HL=0.45   therapeutic  Goal of Therapy:  Heparin level 0.3-0.7 units/ml Monitor platelets by anticoagulation protocol: Yes   Plan:  5/23 0659 HL=0.45   therapeutic Will continue current drip rate of 1300 units/hr. Will check confirmatory HL in 6 hrs CBC daily per protocol  Simrin Vegh A 10/07/2021,8:59 AM

## 2021-10-07 NOTE — ED Notes (Signed)
Troponin=290 c/o labs

## 2021-10-07 NOTE — H&P (Signed)
[place]   PATIENT NAME: Clarence [name]    MR#:  [id number]  DATE OF BIRTH:  06-Dec-1970  DATE OF ADMISSION:  10/06/2021  PRIMARY CARE PHYSICIAN: Pcp, No   Patient is coming from: Home  REQUESTING/REFERRING PHYSICIAN: Ward, Delice Bison, DO  CHIEF COMPLAINT:   Chief Complaint  Patient presents with   Chest Pain    HISTORY OF PRESENT ILLNESS:  Clarence [name] is a 51 y.o. male with medical history significant for Crohn's disease, hypertension, dyslipidemia, combined systolic and diastolic CHF, GERD and chronic kidney disease, presented to the emergency room with acute onset of left shoulder pain followed by midsternal chest pain felt as tightness, moderate in intensity with associated radiation to the left arm with numbness.  He had associated diaphoresis with no nausea or vomiting.  He denied any cough or wheezing or hemoptysis.  No fever or chills.  No dysuria, oliguria, hematuria urgency or frequency or flank pain.  ED Course: Upon presentation to the emergency room, BP was  210/129 with otherwise normal vital signs.  Labs revealed a BUN of 27 with otherwise normal BMP.  CBC was within normal.  And coagulation profile came back normal. EKG as reviewed by me : EKG showed normal sinus rhythm with a rate of 87 with possible left atrial enlargement.  It showed LVH with QRS widening and lateral T wave inversion. Imaging: Two-view chest x-ray showed no acute cardiopulmonary disease.  The patient was given 4 baby aspirin, 10 mg of IV hydralazine and sublingual nitroglycerin as well as IV heparin bolus and drip.  The patient will be admitted to an observation cardiac telemetry bed for further evaluation and management. PAST MEDICAL HISTORY:   Past Medical History:  Diagnosis Date   CHF (congestive heart failure) ([place])    Chronic kidney disease    Crohn's disease ([place])    Hypertension    Proteinuria     PAST SURGICAL HISTORY:   Past Surgical History:  Procedure Laterality  Date   COLECTOMY     RIGHT/LEFT HEART CATH AND CORONARY ANGIOGRAPHY N/A 08/07/2021   Procedure: RIGHT/LEFT HEART CATH AND CORONARY ANGIOGRAPHY;  Surgeon: Nelva Bush, MD;  Location: [facility];  Service: Cardiovascular;  Laterality: N/A;    SOCIAL HISTORY:   Social History   Tobacco Use   Smoking status: Former    Types: Cigars    Quit date: 08/13/2021    Years since quitting: 0.1   Smokeless tobacco: Not on file  Substance Use Topics   Alcohol use: Not Currently    Alcohol/week: 1.0 standard drink    Types: 1 Standard drinks or equivalent per week    FAMILY HISTORY:   Family History  Problem Relation Age of Onset   Heart disease Maternal Uncle    Pancreatic cancer Maternal Uncle    Cancer Maternal Aunt     DRUG ALLERGIES:   Allergies  Allergen Reactions   Ibuprofen Other (See Comments) and Hives    Other reaction(s): GI Upset (intolerance) Hives    Neomycin-Bacitracin Zn-Polymyx     Other reaction(s): Other (See Comments)   Sulfacetamide Other (See Comments)    REVIEW OF SYSTEMS:   ROS As per history of present illness. All pertinent systems were reviewed above. Constitutional, HEENT, cardiovascular, respiratory, GI, GU, musculoskeletal, neuro, psychiatric, endocrine, integumentary and hematologic systems were reviewed and are otherwise negative/unremarkable except for positive findings mentioned above in the HPI.   MEDICATIONS AT HOME:   Prior to  Admission medications   Medication Sig Start Date End Date Taking? Authorizing Provider  acetaminophen (TYLENOL) 325 MG tablet Take 975 mg by mouth every 6 (six) hours as needed for headache or mild pain.   Yes [provider]  aspirin 81 MG EC tablet Take 1 tablet (81 mg total) by mouth daily. Swallow whole. 09/11/21 10/11/21 Yes Jennye Boroughs, MD  atorvastatin (LIPITOR) 80 MG tablet Take 1 tablet (80 mg total) by mouth every evening. 09/22/21 10/22/21 Yes Dunn, Areta Haber, PA-C  carvedilol (COREG)  6.25 MG tablet Take 1 tablet (6.25 mg total) by mouth 2 (two) times daily with a meal. 09/11/21  Yes Jennye Boroughs, MD  clopidogrel (PLAVIX) 75 MG tablet Take 1 tablet (75 mg total) by mouth daily. 09/12/21  Yes Jennye Boroughs, MD  dapagliflozin propanediol (FARXIGA) 10 MG TABS tablet Take 1 tablet (10 mg total) by mouth daily. 09/22/21 10/22/21 Yes Dunn, Areta Haber, PA-C  Famotidine (PEPCID AC PO) Take by mouth as needed.   Yes [provider]  furosemide (LASIX) 40 MG tablet Take 0.5 tablets (20 mg total) by mouth every other day. 09/11/21 10/11/21 Yes Jennye Boroughs, MD  losartan (COZAAR) 25 MG tablet Take 1 tablet (25 mg total) by mouth daily. 10/01/21 12/30/21 Yes Dunn, Areta Haber, PA-C  ondansetron (ZOFRAN-ODT) 4 MG disintegrating tablet Take 4 mg by mouth every 8 (eight) hours as needed for nausea or vomiting.   Yes [provider]      VITAL SIGNS:  Blood pressure (!) 176/105, pulse 75, temperature 98.5 F (36.9 C), temperature source Oral, resp. rate (!) 25, height 5' 10"  (1.778 m), weight 98 kg, SpO2 97 %.  PHYSICAL EXAMINATION:  Physical Exam  GENERAL:  51 y.o.-year-old male patient lying in the bed with no acute distress.  EYES: Pupils equal, round, reactive to light and accommodation. No scleral icterus. Extraocular muscles intact.  HEENT: Head atraumatic, normocephalic. Oropharynx and nasopharynx clear.  NECK:  Supple, no jugular venous distention. No thyroid enlargement, no tenderness.  LUNGS: Normal breath sounds bilaterally, no wheezing, rales,rhonchi or crepitation. No use of accessory muscles of respiration.  CARDIOVASCULAR: Regular rate and rhythm, S1, S2 normal. No murmurs, rubs, or gallops.  ABDOMEN: Soft, nondistended, nontender. Bowel sounds present. No organomegaly or mass.  Intact right colostomy with brown stools. EXTREMITIES: No pedal edema, cyanosis, or clubbing.  NEUROLOGIC: Cranial nerves II through XII are intact. Muscle strength 5/5 in all extremities.  Sensation intact. Gait not checked.  PSYCHIATRIC: The patient is alert and oriented x 3.  Normal affect and good eye contact. SKIN: No obvious rash, lesion, or ulcer.   LABORATORY PANEL:   CBC Recent Labs  Lab 10/06/21 2017  WBC 7.2  HGB 14.0  HCT 41.0  PLT 166   ------------------------------------------------------------------------------------------------------------------  Chemistries  Recent Labs  Lab 10/06/21 2017  NA 141  K 4.1  CL 109  CO2 26  GLUCOSE 111*  BUN 27*  CREATININE 1.20  CALCIUM 9.5   ------------------------------------------------------------------------------------------------------------------  Cardiac Enzymes No results for input(s): TROPONINI in the last 168 hours. ------------------------------------------------------------------------------------------------------------------  RADIOLOGY:  DG Chest 2 View  Result Date: 10/06/2021 CLINICAL DATA:  Chest pain. EXAM: CHEST - 2 VIEW COMPARISON:  Chest radiograph dated September 14, 2021 FINDINGS: The heart size and mediastinal contours are within normal limits. Both lungs are clear. The visualized skeletal structures are unremarkable. IMPRESSION: No active cardiopulmonary disease. Electronically Signed   By: Keane Police D.O.   On: 10/06/2021 20:51  IMPRESSION AND PLAN:  Assessment and Plan: * Chest pain - This is associated with elevated troponin I and is concerning for acute coronary syndrome. - The patient will be admitted to a cardiac telemetry bed. - We will follow serial troponins. - He will be placed on as needed sublingual nitroglycerin and IV morphine sulfate for pain. - We will continue aspirin and Plavix. - We will place him on high-dose statin therapy and continue beta-blocker therapy.  Chronic systolic CHF (congestive heart failure) ([place]) - He has combined systolic and diastolic CHF without current exacerbation. - We will continue Cozaar and Lasix, beta-blocker therapy  as well  as [place].  Hypertensive urgency - We will continue Coreg and Cozaar. - He will be placed on as needed IV labetalol. - We will place him on Nitropaste.   DVT prophylaxis: Lovenox.  Advanced Care Planning:  Code Status: full code.  Family Communication:  The plan of care was discussed in details with the patient (and family). I answered all questions. The patient agreed to proceed with the above mentioned plan. Further management will depend upon hospital course. Disposition Plan: Back to previous home environment Consults called: none.  All the records are reviewed and case discussed with ED provider.  Status is: Observation  I certify that at the time of admission, it is my clinical judgment that the patient will require hospital care extending less than 2 midnights.                            Dispo: The patient is from: Home              Anticipated d/c is to: Home              Patient currently is not medically stable to d/c.              Difficult to place patient: No  Christel Mormon M.D on 10/07/2021 at 3:58 AM  [facility]   From 7 PM-7 AM, contact night-coverage [email or web address]  CC: Primary care physician; Pcp, No

## 2021-10-07 NOTE — Assessment & Plan Note (Signed)
-   We will continue Coreg and Cozaar. - He will be placed on as needed IV labetalol. - We will place him on Nitropaste.

## 2021-10-07 NOTE — ED Notes (Signed)
Pt states that he is currently not having any chest pain.

## 2021-10-08 ENCOUNTER — Inpatient Hospital Stay: Payer: Managed Care, Other (non HMO)

## 2021-10-08 DIAGNOSIS — I160 Hypertensive urgency: Secondary | ICD-10-CM | POA: Diagnosis not present

## 2021-10-08 DIAGNOSIS — N170 Acute kidney failure with tubular necrosis: Secondary | ICD-10-CM

## 2021-10-08 DIAGNOSIS — R072 Precordial pain: Secondary | ICD-10-CM

## 2021-10-08 DIAGNOSIS — N179 Acute kidney failure, unspecified: Secondary | ICD-10-CM

## 2021-10-08 DIAGNOSIS — I5022 Chronic systolic (congestive) heart failure: Secondary | ICD-10-CM | POA: Diagnosis not present

## 2021-10-08 LAB — CBC
HCT: 37.5 % — ABNORMAL LOW (ref 39.0–52.0)
Hemoglobin: 12.9 g/dL — ABNORMAL LOW (ref 13.0–17.0)
MCH: 28.1 pg (ref 26.0–34.0)
MCHC: 34.4 g/dL (ref 30.0–36.0)
MCV: 81.7 fL (ref 80.0–100.0)
Platelets: 150 10*3/uL (ref 150–400)
RBC: 4.59 MIL/uL (ref 4.22–5.81)
RDW: 14.3 % (ref 11.5–15.5)
WBC: 8.2 10*3/uL (ref 4.0–10.5)
nRBC: 0 % (ref 0.0–0.2)

## 2021-10-08 LAB — BASIC METABOLIC PANEL
Anion gap: 8 (ref 5–15)
BUN: 34 mg/dL — ABNORMAL HIGH (ref 6–20)
CO2: 24 mmol/L (ref 22–32)
Calcium: 8.9 mg/dL (ref 8.9–10.3)
Chloride: 107 mmol/L (ref 98–111)
Creatinine, Ser: 1.36 mg/dL — ABNORMAL HIGH (ref 0.61–1.24)
GFR, Estimated: >60 mL/min (ref >60)
Glucose, Bld: 108 mg/dL — ABNORMAL HIGH (ref 70–99)
Potassium: 4.1 mmol/L (ref 3.5–5.1)
Sodium: 139 mmol/L (ref 135–145)

## 2021-10-08 LAB — LIPOPROTEIN A (LPA): Lipoprotein (a): 12.4 nmol/L (ref <75.0)

## 2021-10-08 MED ORDER — PANTOPRAZOLE SODIUM 40 MG IV SOLR
40.0000 mg | INTRAVENOUS | Status: DC
  Administered 2021-10-08 – 2021-10-11 (×4): 40 mg via INTRAVENOUS
  Filled 2021-10-08 (×4): qty 10

## 2021-10-08 NOTE — Progress Notes (Signed)
Progress Note  Patient Name: Clarence [name] Date of Encounter: 10/08/2021  [facility] Cardiologist: Nelva Bush, MD   Subjective   Bps much better. Patient overall is feeling good. Renal US pending.  Inpatient Medications    Scheduled Meds:  aspirin EC  81 mg Oral Daily   atorvastatin  80 mg Oral Daily   carvedilol  6.25 mg Oral BID WC   furosemide  20 mg Intravenous Daily   isosorbide mononitrate  30 mg Oral Daily   losartan  25 mg Oral Daily   pantoprazole (PROTONIX) IV  40 mg Intravenous Q24H   Continuous Infusions:  PRN Meds: acetaminophen, ALPRAZolam, morphine injection, nitroGLYCERIN, ondansetron (ZOFRAN) IV   Vital Signs    Vitals:   10/07/21 1956 10/07/21 2324 10/08/21 0429 10/08/21 0822  BP: 98/68 125/77 137/86 (!) 131/99  Pulse: 78 81 70 76  Resp: 18 17 20    Temp: 98 F (36.7 C) 98.5 F (36.9 C) 98.8 F (37.1 C) 98.1 F (36.7 C)  TempSrc:  Oral Oral Oral  SpO2: 96% 98% 98% 97%  Weight:      Height:       No intake or output data in the 24 hours ending 10/08/21 0850    10/06/2021    8:20 PM 10/01/2021    1:39 PM 09/14/2021    9:01 PM  Last 3 Weights  Weight (lbs) 216 lb 0.8 oz 214 lb 210 lb 5.1 oz  Weight (kg) 98 kg 97.07 kg 95.4 kg      Telemetry    NSR HR 70s - Personally Reviewed  ECG    No new - Personally Reviewed  Physical Exam   GEN: No acute distress.   Neck: No JVD Cardiac: RRR, no murmurs, rubs, or gallops.  Respiratory: Clear to auscultation bilaterally. GI: Soft, nontender, non-distended  MS: No edema; No deformity. Neuro:  Nonfocal  Psych: Normal affect   Labs    High Sensitivity Troponin:   Recent Labs  Lab 09/14/21 2104 10/06/21 2017 10/06/21 2234 10/07/21 0309 10/07/21 0659  TROPONINIHS 80* 46* 81* 290* 221*     Chemistry Recent Labs  Lab 10/06/21 2017 10/07/21 0309 10/08/21 0637  NA 141 140 139  K 4.1 3.5 4.1  CL 109 108 107  CO2 26 25 24   GLUCOSE 111* 100* 108*  BUN 27* 22* 34*   CREATININE 1.20 1.03 1.36*  CALCIUM 9.5 9.3 8.9  GFRNONAA >60 >60 >60  ANIONGAP 6 7 8     Lipids  Recent Labs  Lab 10/07/21 0309  CHOL 149  TRIG 382*  HDL 30*  LDLCALC 43  CHOLHDL 5.0    Hematology Recent Labs  Lab 10/06/21 2017 10/07/21 1615 10/08/21 0637  WBC 7.2 8.1 8.2  RBC 5.03 4.85 4.59  HGB 14.0 13.6 12.9*  HCT 41.0 39.3 37.5*  MCV 81.5 81.0 81.7  MCH 27.8 28.0 28.1  MCHC 34.1 34.6 34.4  RDW 13.9 14.3 14.3  PLT 166 175 150   Thyroid No results for input(s): TSH, FREET4 in the last 168 hours.  BNPNo results for input(s): BNP, PROBNP in the last 168 hours.  DDimer No results for input(s): DDIMER in the last 168 hours.   Radiology    DG Chest 2 View  Result Date: 10/06/2021 CLINICAL DATA:  Chest pain. EXAM: CHEST - 2 VIEW COMPARISON:  Chest radiograph dated September 14, 2021 FINDINGS: The heart size and mediastinal contours are within normal limits. Both lungs are clear. The visualized skeletal structures  are unremarkable. IMPRESSION: No active cardiopulmonary disease. Electronically Signed   By: Keane Police D.O.   On: 10/06/2021 20:51   ECHOCARDIOGRAM COMPLETE  Result Date: 10/07/2021    ECHOCARDIOGRAM REPORT   Patient Name:   Clarence [name] [facility] Date of Exam: 10/07/2021 Medical Rec #:  [id number]         Height:       70.0 in Accession #:    [id number]        Weight:       216.0 lb Date of Birth:  22-Apr-1971          BSA:          2.157 m Patient Age:    51 years          BP:           140/97 mmHg Patient Gender: M                 HR:           75 bpm. Exam Location:  [facility] Procedure: 2D Echo, Cardiac Doppler and Color Doppler Indications:     Elevated troponin  History:         Patient has prior history of Echocardiogram examinations, most                  recent 08/05/2021. CHF and Cardiomyopathy, Cardiac                  catheterization 08/07/21; Risk Factors:Former Smoker,                  Hypertension and Dyslipidemia.  Sonographer:     Rosalia Hammers Referring Phys:   [id number] [name] A MANSY Diagnosing Phys: Nelva Bush MD IMPRESSIONS  1. Left ventricular ejection fraction, by estimation, is 35 to 40%. The left ventricle has moderately decreased function. The left ventricle demonstrates global hypokinesis. There is moderate left ventricular hypertrophy. Left ventricular diastolic parameters are consistent with Grade I diastolic dysfunction (impaired relaxation).  2. Right ventricular systolic function is normal. The right ventricular size is normal.  3. Right atrial size was mildly dilated.  4. The mitral valve is normal in structure. Mild mitral valve regurgitation. No evidence of mitral stenosis.  5. The aortic valve is tricuspid. Aortic valve regurgitation is not visualized. No aortic stenosis is present.  6. There is mild dilatation of the ascending aorta, measuring 40 mm. FINDINGS  Left Ventricle: Left ventricular ejection fraction, by estimation, is 35 to 40%. The left ventricle has moderately decreased function. The left ventricle demonstrates global hypokinesis. The left ventricular internal cavity size was normal in size. There is moderate left ventricular hypertrophy. Left ventricular diastolic parameters are consistent with Grade I diastolic dysfunction (impaired relaxation).  LV Wall Scoring: The entire inferior wall and posterior wall are akinetic. The entire anterior wall, antero-lateral wall, entire septum, apical lateral segment, and apex are hypokinetic. Right Ventricle: The right ventricular size is normal. No increase in right ventricular wall thickness. Right ventricular systolic function is normal. Left Atrium: Left atrial size was normal in size. Right Atrium: Right atrial size was mildly dilated. Pericardium: There is no evidence of pericardial effusion. Mitral Valve: The mitral valve is normal in structure. Mild mitral valve regurgitation. No evidence of mitral valve stenosis. MV peak gradient, 4.8 mmHg. The mean mitral valve gradient is 2.0 mmHg.  Tricuspid Valve: The tricuspid valve is normal in structure. Tricuspid valve regurgitation is trivial. Aortic Valve: The aortic valve is  tricuspid. Aortic valve regurgitation is not visualized. No aortic stenosis is present. Aortic valve mean gradient measures 3.0 mmHg. Aortic valve peak gradient measures 4.8 mmHg. Aortic valve area, by VTI measures 3.17 cm. Pulmonic Valve: The pulmonic valve was normal in structure. Pulmonic valve regurgitation is not visualized. No evidence of pulmonic stenosis. Aorta: The aortic root is normal in size and structure. There is mild dilatation of the ascending aorta, measuring 40 mm. Pulmonary Artery: The pulmonary artery is of normal size. IAS/Shunts: The interatrial septum was not well visualized.  LEFT VENTRICLE PLAX 2D LVIDd:         5.24 cm LVIDs:         4.44 cm LV PW:         1.46 cm LV IVS:        1.33 cm LVOT diam:     2.10 cm LV SV:         59 LV SV Index:   27 LVOT Area:     3.46 cm  RIGHT VENTRICLE RV Basal diam:  2.90 cm LEFT ATRIUM             Index        RIGHT ATRIUM           Index LA diam:        3.60 cm 1.67 cm/m   RA Area:     19.10 cm LA Vol (A2C):   64.8 ml 30.04 ml/m  RA Volume:   53.40 ml  24.76 ml/m LA Vol (A4C):   53.5 ml 24.80 ml/m LA Biplane Vol: 61.0 ml 28.28 ml/m  AORTIC VALVE                    PULMONIC VALVE AV Area (Vmax):    2.76 cm     PV Vmax:       0.90 m/s AV Area (Vmean):   2.19 cm     PV Vmean:      63.400 cm/s AV Area (VTI):     3.17 cm     PV VTI:        0.161 m AV Vmax:           110.00 cm/s  PV Peak grad:  3.3 mmHg AV Vmean:          84.800 cm/s  PV Mean grad:  2.0 mmHg AV VTI:            0.186 m AV Peak Grad:      4.8 mmHg AV Mean Grad:      3.0 mmHg LVOT Vmax:         87.50 cm/s LVOT Vmean:        53.700 cm/s LVOT VTI:          0.170 m LVOT/AV VTI ratio: 0.91  AORTA Ao Root diam: 3.70 cm MITRAL VALVE MV Area (PHT): 4.29 cm    SHUNTS MV Area VTI:   2.84 cm    Systemic VTI:  0.17 m MV Peak grad:  4.8 mmHg    Systemic Diam:  2.10 cm MV Mean grad:  2.0 mmHg MV Vmax:       1.10 m/s MV Vmean:      53.2 cm/s MV Decel Time: 177 msec MV E velocity: 45.40 cm/s MV A velocity: 85.30 cm/s MV E/A ratio:  0.53 Harrell Gave End MD Electronically signed by Nelva Bush MD Signature Date/Time: 10/07/2021/10:42:48 AM    Final     Cardiac Studies  Echo 10/06/21  1. Left ventricular ejection fraction, by estimation, is 35 to 40%. The  left ventricle has moderately decreased function. The left ventricle  demonstrates global hypokinesis. There is moderate left ventricular  hypertrophy. Left ventricular diastolic  parameters are consistent with Grade I diastolic dysfunction (impaired  relaxation).   2. Right ventricular systolic function is normal. The right ventricular  size is normal.   3. Right atrial size was mildly dilated.   4. The mitral valve is normal in structure. Mild mitral valve  regurgitation. No evidence of mitral stenosis.   5. The aortic valve is tricuspid. Aortic valve regurgitation is not  visualized. No aortic stenosis is present.   6. There is mild dilatation of the ascending aorta, measuring 40 mm.    R/L Cardiac cath 08/07/21 Conclusions: Diffuse moderate-severe, multivessel coronary artery disease, including chronic total occlusion of mid RCA, diffuse plaquing of LAD with up to 60% stenosis in the mid vessel, 60-70% proximal/mid LCx stenosis, and 90% lesion of OM1. Moderately elevated left heart filling pressures (LVEDP/PCWP 25 mmHg). Severely elevated right heart filling pressures (mean RA 16 mmHg, RVEDP 30 mmHg). Moderate-severe pulmonary hypertension (mean PAP 42 mmHg, PVR 4.0 WU). Moderately reduced Fick cardiac output/index (CO 4.2 L/min, CI 2.0 L/min/m^2).Marland Kitchen   Recommendations: Suspect cardiomyopathy is a combination of ischemic and nonischemic etiologies confounded by significant pulmonary hypertension and right heart failure that may be due to underlying lung disease.  Recommend continued diuresis  and optimization of GDMT for acute HFrEF.  In the setting of ongoing wheezing with concern for obstructive lung disease, I will hold carvedilol for now and add Entresto. Secondary prevention of coronary artery disease, including high-intensity statin therapy.  Diffuse nature of CAD is not ideal for percutaneous or surgical revascularization.  Mild troponin elevation on admission is most likely due to demand ischemia in the setting of acute HFrEF and chronic ischemic heart disease.   Nelva Bush, MD [facility]   Echo 08/05/21  1. Left ventricular ejection fraction, by estimation, is 30 to 35%. The  left ventricle has moderately decreased function. The left ventricle  demonstrates global hypokinesis. There is moderate left ventricular  hypertrophy. Left ventricular diastolic  parameters are consistent with Grade II diastolic dysfunction  (pseudonormalization).   2. Right ventricular systolic function is normal. The right ventricular  size is normal. Tricuspid regurgitation signal is inadequate for assessing  PA pressure.   3. Left atrial size was mildly dilated.   4. The mitral valve is abnormal. Mild to moderate mitral valve  regurgitation. No evidence of mitral stenosis.   5. The aortic valve has an indeterminant number of cusps. Aortic valve  regurgitation is not visualized. No aortic stenosis is present.   6. The inferior vena cava is dilated in size with <50% respiratory  variability, suggesting right atrial pressure of 15 mmHg.   Patient Profile     51 y.o. 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  Assessment & Plan    Chest pain Hypertensive Urgency - chest pain in the setting of severely elevated BP with BP >200/100 - given ASA 336m, IV hydralazine, SL NTG and nitro paste - restarted home  Losartan 268mdaily, Coreg 6.2570mID - started  on Imdur 18m109mily - Bps much better - Renal US o[place]   Elevated  troponin CAD - HS trop peak 290 with downward trend - EKG with no changes - LHC 07/2021 CTO of the RCA with otherwise nonobstructive disease as outlined above, plan for medical management - started on IV heparin - suspect demand ischemia in the setting of severely elevated BP>>heparin discontinued - continue Aspirin, Plavix, statin, and BB therapy   HFrEF Mixed ICM/NICM Pulmonary HTN - Echo 07/2021 showed 30-35%, G2DD, mild to mod MR - PTA Coreg 6.34mBID, Losartan 277mdaily, Farxiga 1011maily - no spiro with h/o hyperkalemia - GDMT limited as outpatient was limited for soft Bps and CKD - started on IV lasix 80m44mily>can likely go back to PTA lasix - appears euvolemic on exam - continue PTA Losartan and Coreg as above - Echo this admission showed LVEF 35-40%, G1DD, mildly dilated RA, mild MR, mild ascending aortic dilation, measuring 40mm57montinue GDMT as OP   CKD stage 3  - daily BMET  For questions or updates, please contact [facility] Please consult [email or web address] for contact info under        Signed, Lynne Righi H FurNinfa MeekerC  10/08/2021, 8:50 AM

## 2021-10-08 NOTE — TOC Initial Note (Signed)
Transition of Care [facility]) - Initial/Assessment Note    Patient Details  Name: Clarence [name] MRN: [id number] Date of Birth: 25-Dec-1970  Transition of Care [facility]) CM/SW Contact:    Laurena Slimmer, RN Phone Number: 10/08/2021, 4:26 PM  Clinical Narrative:                  Transition of Care [facility]) Screening Note   Patient Details  Name: Clarence [name] Date of Birth: July 23, 1970   Transition of Care [facility]) CM/SW Contact:    Laurena Slimmer, RN Phone Number: 10/08/2021, 4:26 PM    Transition of Care Department [facility]) has reviewed patient and no TOC needs have been identified at this time. We will continue to monitor patient advancement through interdisciplinary progression rounds. If new patient transition needs arise, please place a TOC consult.          Patient Goals and CMS Choice        Expected Discharge Plan and Services                                                Prior Living Arrangements/Services                       Activities of Daily Living Home Assistive Devices/Equipment: None ADL Screening (condition at time of admission) Patient's cognitive ability adequate to safely complete daily activities?: Yes Is the patient deaf or have difficulty hearing?: No Does the patient have difficulty seeing, even when wearing glasses/contacts?: No Does the patient have difficulty concentrating, remembering, or making decisions?: No Patient able to express need for assistance with ADLs?: Yes Does the patient have difficulty dressing or bathing?: No Independently performs ADLs?: Yes (appropriate for developmental age) Does the patient have difficulty walking or climbing stairs?: No Weakness of Legs: None Weakness of Arms/Hands: None  Permission Sought/Granted                  Emotional Assessment              Admission diagnosis:  Elevated troponin [R77.8] Hypertensive urgency [I16.0] Chest pain [R07.9] Nonspecific chest pain  [R07.9] Patient Active Problem List   Diagnosis Date Noted   Chest pain 10/07/2021   Hypertensive urgency 40/98/1191   Chronic systolic CHF (congestive heart failure) ([place]) 10/07/2021   Hypotension due to medication 09/15/2021   Hypertensive emergency 09/09/2021   Acute on chronic combined systolic and diastolic CHF (congestive heart failure) ([place]) 09/09/2021   Dyslipidemia 09/09/2021   Atypical pneumonia 08/05/2021   CKD (chronic kidney disease) stage 2, GFR 60-89 ml/min 08/05/2021   Elevated troponin 08/05/2021   Essential hypertension 08/04/2021   Crohn's disease ([place]) 08/04/2021   PCP:  Merryl Hacker, No Pharmacy:   [id number] [id number]: [phone]: [phone]    Social Determinants of Health (SDOH) Interventions    Readmission Risk Interventions     View : No data to display.

## 2021-10-08 NOTE — Progress Notes (Addendum)
PROGRESS NOTE    Clarence [name]  CBJ:[id number] DOB: 06/15/1970 DOA: 10/06/2021 PCP: Merryl Hacker, No    Brief Narrative:  Clarence [name] is a 51 y.o. male with medical history significant for Crohn's disease, hypertension, dyslipidemia, combined systolic and diastolic CHF, GERD and chronic kidney disease, presented to the emergency room with acute onset of left shoulder pain followed by midsternal chest pain felt as tightness, moderate in intensity with associated radiation to the left arm with numbness.  He had associated diaphoresis with no nausea or vomiting.  He denied any cough or wheezing or hemoptysis.  No fever or chills.  No dysuria, oliguria, hematuria urgency or frequency or flank pain.   Patient was doing groceries when the pain started.  He checked his blood pressure on returning home and it was elevated above 200.  As pain continues he called the EMS.  5/24 no complaints . Just got back from [place]  Consultants:  Cardiology  Procedures: Renal ultrasound  Antimicrobials:      Subjective: No cp or sob. No dizziness or HA  Objective: Vitals:   10/07/21 2324 10/08/21 0429 10/08/21 0822 10/08/21 1506  BP: 125/77 137/86 (!) 131/99 125/80  Pulse: 81 70 76 71  Resp: 17 20    Temp: 98.5 F (36.9 C) 98.8 F (37.1 C) 98.1 F (36.7 C) 98.1 F (36.7 C)  TempSrc: Oral Oral Oral Oral  SpO2: 98% 98% 97% 95%  Weight:      Height:        Intake/Output Summary (Last 24 hours) at 10/08/2021 1657 Last data filed at 10/08/2021 1423 Gross per 24 hour  Intake 480 ml  Output --  Net 480 ml   Filed Weights   10/06/21 2020  Weight: 98 kg    Examination: Calm, NAD Cta no w/r Reg s1/s2 no gallop Soft benign +bs No edema Aaoxox3  Mood and affect appropriate in current setting     Data Reviewed: I have personally reviewed following labs and imaging studies  CBC: Recent Labs  Lab 10/06/21 2017 10/07/21 1615 10/08/21 0637  WBC 7.2 8.1 8.2  HGB 14.0 13.6 12.9*  HCT 41.0  39.3 37.5*  MCV 81.5 81.0 81.7  PLT 166 175 160   Basic Metabolic Panel: Recent Labs  Lab 10/06/21 2017 10/07/21 0309 10/08/21 0637  NA 141 140 139  K 4.1 3.5 4.1  CL 109 108 107  CO2 26 25 24   GLUCOSE 111* 100* 108*  BUN 27* 22* 34*  CREATININE 1.20 1.03 1.36*  CALCIUM 9.5 9.3 8.9   GFR: Estimated Creatinine Clearance: 74.6 mL/min (A) (by C-G formula based on SCr of 1.36 mg/dL (H)). Liver Function Tests: No results for input(s): AST, ALT, ALKPHOS, BILITOT, PROT, ALBUMIN in the last 168 hours. No results for input(s): LIPASE, AMYLASE in the last 168 hours. No results for input(s): AMMONIA in the last 168 hours. Coagulation Profile: Recent Labs  Lab 10/07/21 0001 10/07/21 0309  INR 1.0 1.1   Cardiac Enzymes: No results for input(s): CKTOTAL, CKMB, CKMBINDEX, TROPONINI in the last 168 hours. BNP (last 3 results) No results for input(s): PROBNP in the last 8760 hours. HbA1C: No results for input(s): HGBA1C in the last 72 hours. CBG: No results for input(s): GLUCAP in the last 168 hours. Lipid Profile: Recent Labs    10/07/21 0309  CHOL 149  HDL 30*  LDLCALC 43  TRIG 382*  CHOLHDL 5.0   Thyroid Function Tests: No results for input(s): TSH, T4TOTAL, FREET4, T3FREE, THYROIDAB  in the last 72 hours. Anemia Panel: No results for input(s): VITAMINB12, FOLATE, FERRITIN, TIBC, IRON, RETICCTPCT in the last 72 hours. Sepsis Labs: No results for input(s): PROCALCITON, LATICACIDVEN in the last 168 hours.  No results found for this or any previous visit (from the past 240 hour(s)).       Radiology Studies: DG Chest 2 View  Result Date: 10/06/2021 CLINICAL DATA:  Chest pain. EXAM: CHEST - 2 VIEW COMPARISON:  Chest radiograph dated September 14, 2021 FINDINGS: The heart size and mediastinal contours are within normal limits. Both lungs are clear. The visualized skeletal structures are unremarkable. IMPRESSION: No active cardiopulmonary disease. Electronically Signed   By:  Keane Police D.O.   On: 10/06/2021 20:51   US RENAL ARTERY DUPLEX COMPLETE  Result Date: 10/08/2021 CLINICAL DATA:  51 year old male with hypertensive emergency EXAM: RENAL/URINARY TRACT ULTRASOUND RENAL DUPLEX DOPPLER ULTRASOUND COMPARISON:  None Available. FINDINGS: Right Kidney: Length: 10.1 x 5.4 x 5.8 cm for a total volume of 165 mL. Echogenicity within normal limits. No mass or hydronephrosis visualized. Left Kidney: Length: 10.7 x 4.9 x 4.7 cm for a total volume of 128 mL. Echogenicity within normal limits. No mass or hydronephrosis visualized. Bladder: RENAL DUPLEX ULTRASOUND Right Renal Artery Velocities: (There are 2 right-sided renal arteries; the highest peak systolic velocity will be recorded) Origin:  227 cm/sec Mid:  143 cm/sec Hilum:  80 cm/sec Interlobar:  41 cm/sec Arcuate:  22 cm/sec Left Renal Artery Velocities: Origin:  135 cm/sec Mid:  129 cm/sec Hilum:  100 cm/sec Interlobar:  33 cm/sec Arcuate:  28 cm/sec Aortic Velocity:  88 cm/sec Right Renal-Aortic Ratios: Origin: 2.6 Mid:  1.6 Hilum: 0.9 Interlobar: 0.5 Arcuate: 0.3 Left Renal-Aortic Ratios: Origin: 1.5 Mid: 1.5 Hilum: 1.1 Interlobar: 0.4 Arcuate: 0.3 IMPRESSION: 1. Two right-sided renal arteries, single left-sided renal artery. 2. Elevated peak systolic velocity greater than 200 centimeters/second in 1 of the 2 right renal arteries raises possibility of underlying stenosis. CT or MR arteriogram of the abdomen could further evaluate. 3. No evidence of left renal artery stenosis. 4. Normal sonographic appearance of the kidneys without evidence of hydronephrosis. Signed, Criselda Peaches, MD, [place] [facility] Electronically Signed   By: Jacqulynn Cadet M.D.   On: 10/08/2021 16:11   ECHOCARDIOGRAM COMPLETE  Result Date: 10/07/2021    ECHOCARDIOGRAM REPORT   Patient Name:   Clarence [name] [facility] Date of Exam: 10/07/2021 Medical Rec #:  [id number]         Height:       70.0 in  Accession #:    [id number]        Weight:       216.0 lb Date of Birth:  1970-09-04          BSA:          2.157 m Patient Age:    64 years          BP:           140/97 mmHg Patient Gender: M                 HR:           75 bpm. Exam Location:  [facility] Procedure: 2D Echo, Cardiac Doppler and Color Doppler Indications:     Elevated troponin  History:         Patient has prior history of Echocardiogram examinations, most  recent 08/05/2021. CHF and Cardiomyopathy, Cardiac                  catheterization 08/07/21; Risk Factors:Former Smoker,                  Hypertension and Dyslipidemia.  Sonographer:     Rosalia Hammers Referring Phys:  [id number] [name] A MANSY Diagnosing Phys: Nelva Bush MD IMPRESSIONS  1. Left ventricular ejection fraction, by estimation, is 35 to 40%. The left ventricle has moderately decreased function. The left ventricle demonstrates global hypokinesis. There is moderate left ventricular hypertrophy. Left ventricular diastolic parameters are consistent with Grade I diastolic dysfunction (impaired relaxation).  2. Right ventricular systolic function is normal. The right ventricular size is normal.  3. Right atrial size was mildly dilated.  4. The mitral valve is normal in structure. Mild mitral valve regurgitation. No evidence of mitral stenosis.  5. The aortic valve is tricuspid. Aortic valve regurgitation is not visualized. No aortic stenosis is present.  6. There is mild dilatation of the ascending aorta, measuring 40 mm. FINDINGS  Left Ventricle: Left ventricular ejection fraction, by estimation, is 35 to 40%. The left ventricle has moderately decreased function. The left ventricle demonstrates global hypokinesis. The left ventricular internal cavity size was normal in size. There is moderate left ventricular hypertrophy. Left ventricular diastolic parameters are consistent with Grade I diastolic dysfunction (impaired relaxation).  LV Wall Scoring: The entire inferior wall and  posterior wall are akinetic. The entire anterior wall, antero-lateral wall, entire septum, apical lateral segment, and apex are hypokinetic. Right Ventricle: The right ventricular size is normal. No increase in right ventricular wall thickness. Right ventricular systolic function is normal. Left Atrium: Left atrial size was normal in size. Right Atrium: Right atrial size was mildly dilated. Pericardium: There is no evidence of pericardial effusion. Mitral Valve: The mitral valve is normal in structure. Mild mitral valve regurgitation. No evidence of mitral valve stenosis. MV peak gradient, 4.8 mmHg. The mean mitral valve gradient is 2.0 mmHg. Tricuspid Valve: The tricuspid valve is normal in structure. Tricuspid valve regurgitation is trivial. Aortic Valve: The aortic valve is tricuspid. Aortic valve regurgitation is not visualized. No aortic stenosis is present. Aortic valve mean gradient measures 3.0 mmHg. Aortic valve peak gradient measures 4.8 mmHg. Aortic valve area, by VTI measures 3.17 cm. Pulmonic Valve: The pulmonic valve was normal in structure. Pulmonic valve regurgitation is not visualized. No evidence of pulmonic stenosis. Aorta: The aortic root is normal in size and structure. There is mild dilatation of the ascending aorta, measuring 40 mm. Pulmonary Artery: The pulmonary artery is of normal size. IAS/Shunts: The interatrial septum was not well visualized.  LEFT VENTRICLE PLAX 2D LVIDd:         5.24 cm LVIDs:         4.44 cm LV PW:         1.46 cm LV IVS:        1.33 cm LVOT diam:     2.10 cm LV SV:         59 LV SV Index:   27 LVOT Area:     3.46 cm  RIGHT VENTRICLE RV Basal diam:  2.90 cm LEFT ATRIUM             Index        RIGHT ATRIUM           Index LA diam:        3.60 cm 1.67 cm/m  RA Area:     19.10 cm LA Vol (A2C):   64.8 ml 30.04 ml/m  RA Volume:   53.40 ml  24.76 ml/m LA Vol (A4C):   53.5 ml 24.80 ml/m LA Biplane Vol: 61.0 ml 28.28 ml/m  AORTIC VALVE                    PULMONIC  VALVE AV Area (Vmax):    2.76 cm     PV Vmax:       0.90 m/s AV Area (Vmean):   2.19 cm     PV Vmean:      63.400 cm/s AV Area (VTI):     3.17 cm     PV VTI:        0.161 m AV Vmax:           110.00 cm/s  PV Peak grad:  3.3 mmHg AV Vmean:          84.800 cm/s  PV Mean grad:  2.0 mmHg AV VTI:            0.186 m AV Peak Grad:      4.8 mmHg AV Mean Grad:      3.0 mmHg LVOT Vmax:         87.50 cm/s LVOT Vmean:        53.700 cm/s LVOT VTI:          0.170 m LVOT/AV VTI ratio: 0.91  AORTA Ao Root diam: 3.70 cm MITRAL VALVE MV Area (PHT): 4.29 cm    SHUNTS MV Area VTI:   2.84 cm    Systemic VTI:  0.17 m MV Peak grad:  4.8 mmHg    Systemic Diam: 2.10 cm MV Mean grad:  2.0 mmHg MV Vmax:       1.10 m/s MV Vmean:      53.2 cm/s MV Decel Time: 177 msec MV E velocity: 45.40 cm/s MV A velocity: 85.30 cm/s MV E/A ratio:  0.53 Christopher End MD Electronically signed by Nelva Bush MD Signature Date/Time: 10/07/2021/10:42:48 AM    Final         Scheduled Meds:  aspirin EC  81 mg Oral Daily   atorvastatin  80 mg Oral Daily   carvedilol  6.25 mg Oral BID WC   isosorbide mononitrate  30 mg Oral Daily   losartan  25 mg Oral Daily   pantoprazole (PROTONIX) IV  40 mg Intravenous Q24H   Continuous Infusions:  Assessment & Plan:   Principal Problem:   Chest pain Active Problems:   Hypertensive urgency   Chronic systolic CHF (congestive heart failure) (HCC)   AKI (acute kidney injury) (HCC)   Chest pain history of CAD (CTO RCA, moderate disease LAD, LCx) Cards consulted Elevated tp likely demand ischemia from hypert. Urgency. 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 Continue current  meds   AKI Possibly 2/2 hypotension Renal u[place]oreapending     DVT prophylaxis: scd Code Status:full Family Communication: none bedside Disposition Plan:  Status is: Inpatient Remains inpatient appropriate because: w/u pending.likely dc tomorrow if completed        LOS: 1 day   Time spent: 35 min     Nyilah Kight,  MD [facility] Pager [phone]  If 7PM-7AM, please contact night-coverage 10/08/2021, 4:57 PM

## 2021-10-08 NOTE — Progress Notes (Signed)
Pt. C/o indigestion and is NPO. Neomia Glass, NP paged for orders.

## 2021-10-09 DIAGNOSIS — I160 Hypertensive urgency: Secondary | ICD-10-CM | POA: Diagnosis not present

## 2021-10-09 DIAGNOSIS — I5022 Chronic systolic (congestive) heart failure: Secondary | ICD-10-CM | POA: Diagnosis not present

## 2021-10-09 DIAGNOSIS — R072 Precordial pain: Secondary | ICD-10-CM | POA: Diagnosis not present

## 2021-10-09 DIAGNOSIS — N179 Acute kidney failure, unspecified: Secondary | ICD-10-CM | POA: Diagnosis not present

## 2021-10-09 LAB — POTASSIUM: Potassium: 4.6 mmol/L (ref 3.5–5.1)

## 2021-10-09 LAB — CREATININE, SERUM
Creatinine, Ser: 1.41 mg/dL — ABNORMAL HIGH (ref 0.61–1.24)
GFR, Estimated: 60 mL/min — ABNORMAL LOW (ref >60)

## 2021-10-09 NOTE — Progress Notes (Signed)
PROGRESS NOTE    Clarence [name]  WRU:[id number] DOB: Jun 27, 1970 DOA: 10/06/2021 PCP: Merryl Hacker, No    Brief Narrative:  Clarence [name] is a 51 y.o. male with medical history significant for Crohn's disease, hypertension, dyslipidemia, combined systolic and diastolic CHF, GERD and chronic kidney disease, presented to the emergency room with acute onset of left shoulder pain followed by midsternal chest pain felt as tightness, moderate in intensity with associated radiation to the left arm with numbness.  He had associated diaphoresis with no nausea or vomiting.  He denied any cough or wheezing or hemoptysis.  No fever or chills.  No dysuria, oliguria, hematuria urgency or frequency or flank pain.   Patient was doing groceries when the pain started.  He checked his blood pressure on returning home and it was elevated above 200.  As pain continues he called the EMS.  5/24 no complaints . Just got back from [place] 5/25 no complaints this am. No cp.  Creatinine up 1.41  Consultants:  Cardiology  Procedures: Renal ultrasound  Antimicrobials:      Subjective: No dizziness or lightheadedness.  No shortness of breath  Objective: Vitals:   10/09/21 0003 10/09/21 0355 10/09/21 0807 10/09/21 1136  BP: (!) 148/88 121/76 (!) 166/109 134/86  Pulse: 66 76 67 61  Resp: 20 17 18 16   Temp: 98.9 F (37.2 C) 97.8 F (36.6 C) 97.9 F (36.6 C) 97.7 F (36.5 C)  TempSrc: Oral     SpO2: 99% 99% 100% 98%  Weight:      Height:        Intake/Output Summary (Last 24 hours) at 10/09/2021 1339 Last data filed at 10/08/2021 1906 Gross per 24 hour  Intake 960 ml  Output --  Net 960 ml   Filed Weights   10/06/21 2020  Weight: 98 kg    Examination: Calm, NAD Cta no w/r Reg s1/s2 no gallop Soft benign +bs No edema Aaoxox3  Mood and affect appropriate in current setting     Data Reviewed: I have personally reviewed following labs and imaging studies  CBC: Recent Labs  Lab 10/06/21 2017  10/07/21 1615 10/08/21 0637  WBC 7.2 8.1 8.2  HGB 14.0 13.6 12.9*  HCT 41.0 39.3 37.5*  MCV 81.5 81.0 81.7  PLT 166 175 914   Basic Metabolic Panel: Recent Labs  Lab 10/06/21 2017 10/07/21 0309 10/08/21 0637 10/09/21 0615  NA 141 140 139  --   K 4.1 3.5 4.1 4.6  CL 109 108 107  --   CO2 26 25 24   --   GLUCOSE 111* 100* 108*  --   BUN 27* 22* 34*  --   CREATININE 1.20 1.03 1.36* 1.41*  CALCIUM 9.5 9.3 8.9  --    GFR: Estimated Creatinine Clearance: 71.9 mL/min (A) (by C-G formula based on SCr of 1.41 mg/dL (H)). Liver Function Tests: No results for input(s): AST, ALT, ALKPHOS, BILITOT, PROT, ALBUMIN in the last 168 hours. No results for input(s): LIPASE, AMYLASE in the last 168 hours. No results for input(s): AMMONIA in the last 168 hours. Coagulation Profile: Recent Labs  Lab 10/07/21 0001 10/07/21 0309  INR 1.0 1.1   Cardiac Enzymes: No results for input(s): CKTOTAL, CKMB, CKMBINDEX, TROPONINI in the last 168 hours. BNP (last 3 results) No results for input(s): PROBNP in the last 8760 hours. HbA1C: No results for input(s): HGBA1C in the last 72 hours. CBG: No results for input(s): GLUCAP in the last 168 hours. Lipid Profile: Recent  Labs    10/07/21 0309  CHOL 149  HDL 30*  LDLCALC 43  TRIG 382*  CHOLHDL 5.0   Thyroid Function Tests: No results for input(s): TSH, T4TOTAL, FREET4, T3FREE, THYROIDAB in the last 72 hours. Anemia Panel: No results for input(s): VITAMINB12, FOLATE, FERRITIN, TIBC, IRON, RETICCTPCT in the last 72 hours. Sepsis Labs: No results for input(s): PROCALCITON, LATICACIDVEN in the last 168 hours.  No results found for this or any previous visit (from the past 240 hour(s)).       Radiology Studies: US RENAL ARTERY DUPLEX COMPLETE  Result Date: 10/08/2021 CLINICAL DATA:  51 year old male with hypertensive emergency EXAM: RENAL/URINARY TRACT ULTRASOUND RENAL DUPLEX DOPPLER ULTRASOUND COMPARISON:  None Available. FINDINGS: Right  Kidney: Length: 10.1 x 5.4 x 5.8 cm for a total volume of 165 mL. Echogenicity within normal limits. No mass or hydronephrosis visualized. Left Kidney: Length: 10.7 x 4.9 x 4.7 cm for a total volume of 128 mL. Echogenicity within normal limits. No mass or hydronephrosis visualized. Bladder: RENAL DUPLEX ULTRASOUND Right Renal Artery Velocities: (There are 2 right-sided renal arteries; the highest peak systolic velocity will be recorded) Origin:  227 cm/sec Mid:  143 cm/sec Hilum:  80 cm/sec Interlobar:  41 cm/sec Arcuate:  22 cm/sec Left Renal Artery Velocities: Origin:  135 cm/sec Mid:  129 cm/sec Hilum:  100 cm/sec Interlobar:  33 cm/sec Arcuate:  28 cm/sec Aortic Velocity:  88 cm/sec Right Renal-Aortic Ratios: Origin: 2.6 Mid:  1.6 Hilum: 0.9 Interlobar: 0.5 Arcuate: 0.3 Left Renal-Aortic Ratios: Origin: 1.5 Mid: 1.5 Hilum: 1.1 Interlobar: 0.4 Arcuate: 0.3 IMPRESSION: 1. Two right-sided renal arteries, single left-sided renal artery. 2. Elevated peak systolic velocity greater than 200 centimeters/second in 1 of the 2 right renal arteries raises possibility of underlying stenosis. CT or MR arteriogram of the abdomen could further evaluate. 3. No evidence of left renal artery stenosis. 4. Normal sonographic appearance of the kidneys without evidence of hydronephrosis. Signed, Criselda Peaches, MD, [place] [facility] Radiology Electronically Signed   By: Jacqulynn Cadet M.D.   On: 10/08/2021 16:11        Scheduled Meds:  aspirin EC  81 mg Oral Daily   atorvastatin  80 mg Oral Daily   carvedilol  6.25 mg Oral BID WC   isosorbide mononitrate  30 mg Oral Daily   losartan  25 mg Oral Daily   pantoprazole (PROTONIX) IV  40 mg Intravenous Q24H   Continuous Infusions:  Assessment & Plan:   Principal Problem:   Chest pain Active Problems:   Hypertensive urgency   Chronic systolic CHF (congestive heart failure) (HCC)   AKI (acute kidney injury)  (HCC)   Chest pain history of CAD (CTO RCA, moderate disease LAD, LCx) Cards consulted Elevated tp likely demand ischemia from hypert. Urgency. 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     DVT prophylaxis: scd Code Status:full Family Communication: none bedside Disposition  Plan: TBD, going back home Status is: Inpatient Remains inpatient appropriate because: w/u pending. Vascular surgery consulted.      LOS: 2 days   Time spent: 35 min     Nolberto Hanlon, MD [facility] Pager [phone]  If 7PM-7AM, please contact night-coverage 10/09/2021, 1:39 PM

## 2021-10-10 ENCOUNTER — Inpatient Hospital Stay: Payer: Managed Care, Other (non HMO)

## 2021-10-10 DIAGNOSIS — I5022 Chronic systolic (congestive) heart failure: Secondary | ICD-10-CM | POA: Diagnosis not present

## 2021-10-10 DIAGNOSIS — I160 Hypertensive urgency: Secondary | ICD-10-CM | POA: Diagnosis not present

## 2021-10-10 DIAGNOSIS — R072 Precordial pain: Secondary | ICD-10-CM | POA: Diagnosis not present

## 2021-10-10 DIAGNOSIS — N179 Acute kidney failure, unspecified: Secondary | ICD-10-CM | POA: Diagnosis not present

## 2021-10-10 LAB — POTASSIUM: Potassium: 4 mmol/L (ref 3.5–5.1)

## 2021-10-10 LAB — CREATININE, SERUM
Creatinine, Ser: 1.37 mg/dL — ABNORMAL HIGH (ref 0.61–1.24)
GFR, Estimated: >60 mL/min (ref >60)

## 2021-10-10 MED ORDER — IOHEXOL 350 MG/ML SOLN
100.0000 mL | Freq: Once | INTRAVENOUS | Status: AC | PRN
  Administered 2021-10-10: 100 mL via INTRAVENOUS

## 2021-10-10 NOTE — Progress Notes (Signed)
Mobility Specialist - Progress Note    10/10/21 1400  Mobility  Activity Ambulated independently in hallway;Stood at bedside;Dangled on edge of bed  Level of Assistance Independent  Assistive Device None  Distance Ambulated (ft) 320 ft  Activity Response Tolerated well  $Mobility charge 1 Mobility   Pt long sitting upon arrival using RA. Completes all activities indep and ambulates 2 laps voicing no complaints. HR 85 BPM pre mobility and 98-100 BPM during mobility. Denies chest pain and dizziness and is left in bed with needs in reach.  Merrily Brittle Mobility Specialist 10/10/21, 2:22 PM

## 2021-10-10 NOTE — Consult Note (Cosign Needed Addendum)
[facility] Vascular Consult Note  MRN : [id number]  Clarence [name] is a 51 y.o. (02/17/1971) male who presents with chief complaint of  Chief Complaint  Patient presents with   Chest Pain  .   Consulting Physician: Nolberto Hanlon, MD Reason for consult: Renal artery stenosis History of Present Illness: Clarence [name] is a 51 year old male with a past medical history significant for hypertension, dyslipidemia, CHF and chronic kidney disease.  He presented to the [facility] emergency room with chest pain and tightness that radiated to his left arm.  There is no nausea or vomiting.  He noted that this happened while he was grocery shopping and upon returning home he had elevated blood pressure above 200.  Given the symptoms, the patient called EMS.  1 to the patient underwent work-up and in the midst of this work-up he had a renal artery ultrasound for possible renal artery stenosis.  The patient noted no evidence of left renal artery stenosis.  The patient does have 2 right-sided renal arteries.  Of these 2 right-sided renal arteries the velocities were greater than 200 indicating possible significant stenosis.  It is not noted whether 1 artery is dominant versus the other.  Kidney size is normal bilaterally  Current Facility-Administered Medications  Medication Dose Route Frequency Provider Last Rate Last Admin   acetaminophen (TYLENOL) tablet 650 mg  650 mg Oral Q4H PRN Mansy, Jan A, MD   650 mg at 10/10/21 0048   ALPRAZolam Duanne Moron) tablet 0.25 mg  0.25 mg Oral BID PRN Mansy, Jan A, MD   0.25 mg at 10/07/21 0426   aspirin EC tablet 81 mg  81 mg Oral Daily Mansy, Jan A, MD   81 mg at 10/10/21 7846   atorvastatin (LIPITOR) tablet 80 mg  80 mg Oral Daily Mansy, Jan A, MD   80 mg at 10/10/21 9629   carvedilol (COREG) tablet 6.25 mg  6.25 mg Oral BID WC Furth, Cadence H, PA-C   6.25 mg at 10/10/21 5284   isosorbide mononitrate (IMDUR) 24 hr tablet  30 mg  30 mg Oral Daily Furth, Cadence H, PA-C   30 mg at 10/10/21 1324   losartan (COZAAR) tablet 25 mg  25 mg Oral Daily Furth, Cadence H, PA-C   25 mg at 10/10/21 4010   morphine (PF) 2 MG/ML injection 2 mg  2 mg Intravenous Q2H PRN Mansy, Jan A, MD       nitroGLYCERIN (NITROSTAT) SL tablet 0.4 mg  0.4 mg Sublingual Q5 Min x 3 PRN Mansy, Jan A, MD       ondansetron [facility]) injection 4 mg  4 mg Intravenous Q6H PRN Mansy, Jan A, MD       pantoprazole (PROTONIX) injection 40 mg  40 mg Intravenous Q24H Foust, Katy L, NP   40 mg at 10/10/21 2725    Past Medical History:  Diagnosis Date   CHF (congestive heart failure) ([place])    Chronic kidney disease    Crohn's disease ([place])    Hypertension    Proteinuria     Past Surgical History:  Procedure Laterality Date   COLECTOMY     RIGHT/LEFT HEART CATH AND CORONARY ANGIOGRAPHY N/A 08/07/2021   Procedure: RIGHT/LEFT HEART CATH AND CORONARY ANGIOGRAPHY;  Surgeon: Nelva Bush, MD;  Location: [place] [facility];  Service: Cardiovascular;  Laterality: N/A;    Social History Social History   Tobacco Use   Smoking status: Former    Types:  Cigars    Quit date: 08/13/2021    Years since quitting: 0.1  Vaping Use   Vaping Use: Never used  Substance Use Topics   Alcohol use: Not Currently    Alcohol/week: 1.0 standard drink    Types: 1 Standard drinks or equivalent per week   Drug use: Never    Family History Family History  Problem Relation Age of Onset   Heart disease Maternal Uncle    Pancreatic cancer Maternal Uncle    Cancer Maternal Aunt     Allergies  Allergen Reactions   Ibuprofen Other (See Comments) and Hives    Other reaction(s): GI Upset (intolerance) Hives    Neomycin-Bacitracin Zn-Polymyx     Other reaction(s): Other (See Comments)   Sulfacetamide Other (See Comments)     REVIEW OF SYSTEMS (Negative unless checked)  Constitutional: [] Weight loss  [] Fever  [] Chills Cardiac: [] Chest pain   [] Chest pressure    [] Palpitations   [] Shortness of breath when laying flat   [] Shortness of breath at rest   [] Shortness of breath with exertion. Vascular:  [] Pain in legs with walking   [] Pain in legs at rest   [] Pain in legs when laying flat   [] Claudication   [] Pain in feet when walking  [] Pain in feet at rest  [] Pain in feet when laying flat   [] History of DVT   [] Phlebitis   [] Swelling in legs   [] Varicose veins   [] Non-healing ulcers Pulmonary:   [] Uses home oxygen   [] Productive cough   [] Hemoptysis   [] Wheeze  [] COPD   [] Asthma Neurologic:  [] Dizziness  [] Blackouts   [] Seizures   [] History of stroke   [] History of TIA  [] Aphasia   [] Temporary blindness   [] Dysphagia   [] Weakness or numbness in arms   [] Weakness or numbness in legs Musculoskeletal:  [] Arthritis   [] Joint swelling   [] Joint pain   [] Low back pain Hematologic:  [] Easy bruising  [] Easy bleeding   [] Hypercoagulable state   [] Anemic  [] Hepatitis Gastrointestinal:  [] Blood in stool   [] Vomiting blood  [] Gastroesophageal reflux/heartburn   [] Difficulty swallowing. Genitourinary:  [] Chronic kidney disease   [] Difficult urination  [] Frequent urination  [] Burning with urination   [] Blood in urine Skin:  [] Rashes   [] Ulcers   [] Wounds Psychological:  [] History of anxiety   []  History of major depression.  Physical Examination  Vitals:   10/10/21 0336 10/10/21 0809 10/10/21 0929 10/10/21 1200  BP: 127/78 (!) 140/101  122/83  Pulse: (!) 52 (!) 59 65 70  Resp: 18 16  16   Temp: 98.1 F (36.7 C) 98.5 F (36.9 C)  98.2 F (36.8 C)  TempSrc:  Oral  Oral  SpO2: 98% 99%  98%  Weight:      Height:       Body mass index is 31 kg/m. Gen:  WD/WN, NAD Head: [place]/AT, No temporalis wasting. Prominent temp pulse not noted. Ear/Nose/Throat: Hearing grossly intact, nares w/o erythema or drainage, oropharynx w/o Erythema/Exudate Eyes: Sclera non-icteric, conjunctiva clear Neck: Trachea midline.  No JVD.  Pulmonary:  Good air movement, respirations not  labored, equal bilaterally.  Cardiac: RRR, normal S1, S2. Vascular:  Vessel Right Left  PT Palpable Palpable  DP Palpable Palpable   Gastrointestinal: soft, non-tender/non-distended. No guarding/reflex.  Musculoskeletal: M/S 5/5 throughout.  Extremities without ischemic changes.  No deformity or atrophy. No edema. Neurologic: Sensation grossly intact in extremities.  Symmetrical.  Speech is fluent. Motor exam as listed above. Psychiatric: Judgment intact, Mood & affect  appropriate for pt's clinical situation. Dermatologic: No rashes or ulcers noted.  No cellulitis or open wounds. Lymph : No Cervical, Axillary, or Inguinal lymphadenopathy.    CBC Lab Results  Component Value Date   WBC 8.2 10/08/2021   HGB 12.9 (L) 10/08/2021   HCT 37.5 (L) 10/08/2021   MCV 81.7 10/08/2021   PLT 150 10/08/2021    BMET    Component Value Date/Time   NA 139 10/08/2021 0637   K 4.0 10/10/2021 0518   CL 107 10/08/2021 0637   CO2 24 10/08/2021 0637   GLUCOSE 108 (H) 10/08/2021 0637   BUN 34 (H) 10/08/2021 0637   CREATININE 1.37 (H) 10/10/2021 0518   CALCIUM 8.9 10/08/2021 0637   GFRNONAA >60 10/10/2021 0518   GFRAA  06/11/2010 1440    >60        The eGFR has been calculated using the MDRD equation. This calculation has not been validated in all clinical situations. eGFR's persistently <60 mL/min signify possible Chronic Kidney Disease.   Estimated Creatinine Clearance: 74 mL/min (A) (by C-G formula based on SCr of 1.37 mg/dL (H)).  COAG Lab Results  Component Value Date   INR 1.1 10/07/2021   INR 1.0 10/07/2021   INR 1.0 09/14/2021    Radiology DG Chest 2 View  Result Date: 10/06/2021 CLINICAL DATA:  Chest pain. EXAM: CHEST - 2 VIEW COMPARISON:  Chest radiograph dated September 14, 2021 FINDINGS: The heart size and mediastinal contours are within normal limits. Both lungs are clear. The visualized skeletal structures are unremarkable. IMPRESSION: No active cardiopulmonary disease.  Electronically Signed   By: Keane Police D.O.   On: 10/06/2021 20:51   CT HEAD WO CONTRAST (5MM)  Result Date: 09/14/2021 CLINICAL DATA:  Headache, new or worsening EXAM: CT HEAD WITHOUT CONTRAST TECHNIQUE: Contiguous axial images were obtained from the base of the skull through the vertex without intravenous contrast. RADIATION DOSE REDUCTION: This exam was performed according to the departmental dose-optimization program which includes automated exposure control, adjustment of the mA and/or kV according to patient size and/or use of iterative reconstruction technique. COMPARISON:  None. FINDINGS: Brain: No evidence of acute infarction, hemorrhage, cerebral edema, mass, mass effect, or midline shift. No hydrocephalus or extra-axial fluid collection. Vascular: No hyperdense vessel. Skull: Normal. Negative for fracture or focal lesion. Sinuses/Orbits: No acute finding. Other: The mastoid air cells are well aerated. IMPRESSION: No acute intracranial process. No etiology is seen for the patient's headache. Electronically Signed   By: Merilyn Baba M.D.   On: 09/14/2021 23:03   CT Angio Chest Pulmonary Embolism (PE) W or WO Contrast  Result Date: 09/15/2021 CLINICAL DATA:  Shortness of breath and chest pain. Positive D-dimer. Clinical suspicion for pulmonary embolism. EXAM: CT ANGIOGRAPHY CHEST WITH CONTRAST TECHNIQUE: Multidetector CT imaging of the chest was performed using the standard protocol during bolus administration of intravenous contrast. Multiplanar CT image reconstructions and MIPs were obtained to evaluate the vascular anatomy. RADIATION DOSE REDUCTION: This exam was performed according to the departmental dose-optimization program which includes automated exposure control, adjustment of the mA and/or kV according to patient size and/or use of iterative reconstruction technique. CONTRAST:  134m OMNIPAQUE IOHEXOL 350 MG/ML SOLN COMPARISON:  08/04/2021 FINDINGS: Cardiovascular: Satisfactory  opacification of pulmonary arteries noted, and no pulmonary emboli identified. No evidence of thoracic aortic aneurysm. Mediastinum/Nodes: No masses or pathologically enlarged lymph nodes identified. Lungs/Pleura: No pulmonary mass, infiltrate, or effusion. Upper abdomen: No acute findings. Musculoskeletal: No suspicious bone lesions identified. Review  of the MIP images confirms the above findings. IMPRESSION: Negative. No evidence of pulmonary embolism or other active disease. Electronically Signed   By: Marlaine Hind M.D.   On: 09/15/2021 13:44   DG Chest Port 1 View  Result Date: 09/14/2021 CLINICAL DATA:  Chest pain, weakness and shortness of breath. EXAM: PORTABLE CHEST 1 VIEW COMPARISON:  September 09, 2021 FINDINGS: The cardiac silhouette is enlarged and unchanged in size. Very mild atelectasis is seen within the left lung base. Both lungs are otherwise clear. The visualized skeletal structures are unremarkable. IMPRESSION: 1. Stable cardiomegaly. 2. Very mild left basilar atelectasis. Electronically Signed   By: Virgina Norfolk M.D.   On: 09/14/2021 21:30   US RENAL ARTERY DUPLEX COMPLETE  Result Date: 10/08/2021 CLINICAL DATA:  51 year old male with hypertensive emergency EXAM: RENAL/URINARY TRACT ULTRASOUND RENAL DUPLEX DOPPLER ULTRASOUND COMPARISON:  None Available. FINDINGS: Right Kidney: Length: 10.1 x 5.4 x 5.8 cm for a total volume of 165 mL. Echogenicity within normal limits. No mass or hydronephrosis visualized. Left Kidney: Length: 10.7 x 4.9 x 4.7 cm for a total volume of 128 mL. Echogenicity within normal limits. No mass or hydronephrosis visualized. Bladder: RENAL DUPLEX ULTRASOUND Right Renal Artery Velocities: (There are 2 right-sided renal arteries; the highest peak systolic velocity will be recorded) Origin:  227 cm/sec Mid:  143 cm/sec Hilum:  80 cm/sec Interlobar:  41 cm/sec Arcuate:  22 cm/sec Left Renal Artery Velocities: Origin:  135 cm/sec Mid:  129 cm/sec Hilum:  100 cm/sec  Interlobar:  33 cm/sec Arcuate:  28 cm/sec Aortic Velocity:  88 cm/sec Right Renal-Aortic Ratios: Origin: 2.6 Mid:  1.6 Hilum: 0.9 Interlobar: 0.5 Arcuate: 0.3 Left Renal-Aortic Ratios: Origin: 1.5 Mid: 1.5 Hilum: 1.1 Interlobar: 0.4 Arcuate: 0.3 IMPRESSION: 1. Two right-sided renal arteries, single left-sided renal artery. 2. Elevated peak systolic velocity greater than 200 centimeters/second in 1 of the 2 right renal arteries raises possibility of underlying stenosis. CT or MR arteriogram of the abdomen could further evaluate. 3. No evidence of left renal artery stenosis. 4. Normal sonographic appearance of the kidneys without evidence of hydronephrosis. Signed, Criselda Peaches, MD, [facility] Radiology Electronically Signed   By: Jacqulynn Cadet M.D.   On: 10/08/2021 16:11   ECHOCARDIOGRAM COMPLETE  Result Date: 10/07/2021    ECHOCARDIOGRAM REPORT   Patient Name:   Clarence [name] [facility] Date of Exam: 10/07/2021 Medical Rec #:  [id number]         Height:       70.0 in Accession #:    [id number]        Weight:       216.0 lb Date of Birth:  Feb 24, 1971          BSA:          2.157 m Patient Age:    53 years          BP:           140/97 mmHg Patient Gender: M                 HR:           75 bpm. Exam Location:  [facility] Procedure: 2D Echo, Cardiac Doppler and Color Doppler Indications:     Elevated troponin  History:         Patient has prior history of Echocardiogram examinations, most                  recent  08/05/2021. CHF and Cardiomyopathy, Cardiac                  catheterization 08/07/21; Risk Factors:Former Smoker,                  Hypertension and Dyslipidemia.  Sonographer:     Rosalia Hammers Referring Phys:  [id number] [name] A MANSY Diagnosing Phys: Nelva Bush MD IMPRESSIONS  1. Left ventricular ejection fraction, by estimation, is 35 to 40%. The left ventricle has moderately decreased function. The left ventricle demonstrates global hypokinesis. There  is moderate left ventricular hypertrophy. Left ventricular diastolic parameters are consistent with Grade I diastolic dysfunction (impaired relaxation).  2. Right ventricular systolic function is normal. The right ventricular size is normal.  3. Right atrial size was mildly dilated.  4. The mitral valve is normal in structure. Mild mitral valve regurgitation. No evidence of mitral stenosis.  5. The aortic valve is tricuspid. Aortic valve regurgitation is not visualized. No aortic stenosis is present.  6. There is mild dilatation of the ascending aorta, measuring 40 mm. FINDINGS  Left Ventricle: Left ventricular ejection fraction, by estimation, is 35 to 40%. The left ventricle has moderately decreased function. The left ventricle demonstrates global hypokinesis. The left ventricular internal cavity size was normal in size. There is moderate left ventricular hypertrophy. Left ventricular diastolic parameters are consistent with Grade I diastolic dysfunction (impaired relaxation).  LV Wall Scoring: The entire inferior wall and posterior wall are akinetic. The entire anterior wall, antero-lateral wall, entire septum, apical lateral segment, and apex are hypokinetic. Right Ventricle: The right ventricular size is normal. No increase in right ventricular wall thickness. Right ventricular systolic function is normal. Left Atrium: Left atrial size was normal in size. Right Atrium: Right atrial size was mildly dilated. Pericardium: There is no evidence of pericardial effusion. Mitral Valve: The mitral valve is normal in structure. Mild mitral valve regurgitation. No evidence of mitral valve stenosis. MV peak gradient, 4.8 mmHg. The mean mitral valve gradient is 2.0 mmHg. Tricuspid Valve: The tricuspid valve is normal in structure. Tricuspid valve regurgitation is trivial. Aortic Valve: The aortic valve is tricuspid. Aortic valve regurgitation is not visualized. No aortic stenosis is present. Aortic valve mean gradient  measures 3.0 mmHg. Aortic valve peak gradient measures 4.8 mmHg. Aortic valve area, by VTI measures 3.17 cm. Pulmonic Valve: The pulmonic valve was normal in structure. Pulmonic valve regurgitation is not visualized. No evidence of pulmonic stenosis. Aorta: The aortic root is normal in size and structure. There is mild dilatation of the ascending aorta, measuring 40 mm. Pulmonary Artery: The pulmonary artery is of normal size. IAS/Shunts: The interatrial septum was not well visualized.  LEFT VENTRICLE PLAX 2D LVIDd:         5.24 cm LVIDs:         4.44 cm LV PW:         1.46 cm LV IVS:        1.33 cm LVOT diam:     2.10 cm LV SV:         59 LV SV Index:   27 LVOT Area:     3.46 cm  RIGHT VENTRICLE RV Basal diam:  2.90 cm LEFT ATRIUM             Index        RIGHT ATRIUM           Index LA diam:        3.60 cm 1.67 cm/m  RA Area:     19.10 cm LA Vol (A2C):   64.8 ml 30.04 ml/m  RA Volume:   53.40 ml  24.76 ml/m LA Vol (A4C):   53.5 ml 24.80 ml/m LA Biplane Vol: 61.0 ml 28.28 ml/m  AORTIC VALVE                    PULMONIC VALVE AV Area (Vmax):    2.76 cm     PV Vmax:       0.90 m/s AV Area (Vmean):   2.19 cm     PV Vmean:      63.400 cm/s AV Area (VTI):     3.17 cm     PV VTI:        0.161 m AV Vmax:           110.00 cm/s  PV Peak grad:  3.3 mmHg AV Vmean:          84.800 cm/s  PV Mean grad:  2.0 mmHg AV VTI:            0.186 m AV Peak Grad:      4.8 mmHg AV Mean Grad:      3.0 mmHg LVOT Vmax:         87.50 cm/s LVOT Vmean:        53.700 cm/s LVOT VTI:          0.170 m LVOT/AV VTI ratio: 0.91  AORTA Ao Root diam: 3.70 cm MITRAL VALVE MV Area (PHT): 4.29 cm    SHUNTS MV Area VTI:   2.84 cm    Systemic VTI:  0.17 m MV Peak grad:  4.8 mmHg    Systemic Diam: 2.10 cm MV Mean grad:  2.0 mmHg MV Vmax:       1.10 m/s MV Vmean:      53.2 cm/s MV Decel Time: 177 msec MV E velocity: 45.40 cm/s MV A velocity: 85.30 cm/s MV E/A ratio:  0.53 Harrell Gave End MD Electronically signed by Nelva Bush MD Signature  Date/Time: 10/07/2021/10:42:48 AM    Final       Assessment/Plan 1.  Renal artery stenosis  The patient has 2 renal arteries noted on his renal duplex.  His left renal artery is patent with no significant stenosis.  There is no differentiation on ultrasound whether 1 renal artery is more dominant versus the other.  Velocities in one of the renal arteries indicates there is a possible significant stenosis.  The ability for intervention largely depends on which renal artery has a significant stenosis.  If there is a more dominant artery, and it intervention would likely be beneficial for the patient in the setting of his elevated blood pressures.  However if the stenosis is noted in an artery that is small and nondominant, intervention would not be useful and in some cases it would not be possible.  In order to better evaluate the size of the renal arteries as well as if intervention would be feasible, we will order CT angiogram to evaluate.  We will plan for possible intervention pending the results.  If indeed the patient does need intervention, this could be planned on an outpatient basis if the patient is found to be stable for discharge prior to Tuesday.  However, if the patient is still inpatient, we will plan for intervention on Tuesday if necessary.  Addendum: The patient underwent a CT angiogram which revealed no evidence of stenosis of the right renal artery.  There is  no dual renal arteries but a solitary artery.  There is also no evidence of significant atherosclerosis or FMD.  Based on this, patient's hypertension not attributed to renal artery stenosis.  No intervention will be planned.  No outpatient follow-up necessary.  2.  Dyslipidemia Continue statin therapy   Family Communication: None present at bedside   Kris Hartmann, NP [facility] [phone] (Office Phone) [phone] (Office Fax)  10/10/2021 1:20 PM    This note was created with Dragon  medical transcription system.  Any error is purely unintentional

## 2021-10-10 NOTE — Progress Notes (Signed)
PROGRESS NOTE    Clarence [name]  HTD:[id number] DOB: [birth year] DOA: 10/06/2021 PCP: Merryl Hacker, No    Brief Narrative:  Clarence [name] is a 51 y.o. male with medical history significant for Crohn's disease, hypertension, dyslipidemia, combined systolic and diastolic CHF, GERD and chronic kidney disease, presented to the emergency room with acute onset of left shoulder pain followed by midsternal chest pain felt as tightness, moderate in intensity with associated radiation to the left arm with numbness.  He had associated diaphoresis with no nausea or vomiting.  He denied any cough or wheezing or hemoptysis.  No fever or chills.  No dysuria, oliguria, hematuria urgency or frequency or flank pain.   Patient was doing groceries when the pain started.  He checked his blood pressure on returning home and it was elevated above 200.  As pain continues he called the EMS.  5/24 no complaints . Just got back from [place] 5/25 no complaints this am. No cp.  Creatinine up 1.41 5/26 no complaints this am except mild HA.   Consultants:  Cardiology, vascular surgery  Procedures: Renal ultrasound  Antimicrobials:      Subjective: No dizziness or shortness of breath.  No chest pain  Objective: Vitals:   10/10/21 0336 10/10/21 0809 10/10/21 0929 10/10/21 1200  BP: 127/78 (!) 140/101  122/83  Pulse: (!) 52 (!) 59 65 70  Resp: 18 16  16   Temp: 98.1 F (36.7 C) 98.5 F (36.9 C)  98.2 F (36.8 C)  TempSrc:  Oral  Oral  SpO2: 98% 99%  98%  Weight:      Height:        Intake/Output Summary (Last 24 hours) at 10/10/2021 1405 Last data filed at 10/10/2021 1355 Gross per 24 hour  Intake 600 ml  Output --  Net 600 ml   Filed Weights   10/06/21 2020  Weight: 98 kg    Examination: Calm, NAD Cta no w/r Reg s1/s2 no gallop Soft benign +bs No edema Aaoxox3  Mood and affect appropriate in current setting     Data Reviewed: I have personally reviewed following labs and imaging  studies  CBC: Recent Labs  Lab 10/06/21 2017 10/07/21 1615 10/08/21 0637  WBC 7.2 8.1 8.2  HGB 14.0 13.6 12.9*  HCT 41.0 39.3 37.5*  MCV 81.5 81.0 81.7  PLT 166 175 726   Basic Metabolic Panel: Recent Labs  Lab 10/06/21 2017 10/07/21 0309 10/08/21 0637 10/09/21 0615 10/10/21 0518  NA 141 140 139  --   --   K 4.1 3.5 4.1 4.6 4.0  CL 109 108 107  --   --   CO2 26 25 24   --   --   GLUCOSE 111* 100* 108*  --   --   BUN 27* 22* 34*  --   --   CREATININE 1.20 1.03 1.36* 1.41* 1.37*  CALCIUM 9.5 9.3 8.9  --   --    GFR: Estimated Creatinine Clearance: 74 mL/min (A) (by C-G formula based on SCr of 1.37 mg/dL (H)). Liver Function Tests: No results for input(s): AST, ALT, ALKPHOS, BILITOT, PROT, ALBUMIN in the last 168 hours. No results for input(s): LIPASE, AMYLASE in the last 168 hours. No results for input(s): AMMONIA in the last 168 hours. Coagulation Profile: Recent Labs  Lab 10/07/21 0001 10/07/21 0309  INR 1.0 1.1   Cardiac Enzymes: No results for input(s): CKTOTAL, CKMB, CKMBINDEX, TROPONINI in the last 168 hours. BNP (last 3 results) No results for  input(s): PROBNP in the last 8760 hours. HbA1C: No results for input(s): HGBA1C in the last 72 hours. CBG: No results for input(s): GLUCAP in the last 168 hours. Lipid Profile: No results for input(s): CHOL, HDL, LDLCALC, TRIG, CHOLHDL, LDLDIRECT in the last 72 hours.  Thyroid Function Tests: No results for input(s): TSH, T4TOTAL, FREET4, T3FREE, THYROIDAB in the last 72 hours. Anemia Panel: No results for input(s): VITAMINB12, FOLATE, FERRITIN, TIBC, IRON, RETICCTPCT in the last 72 hours. Sepsis Labs: No results for input(s): PROCALCITON, LATICACIDVEN in the last 168 hours.  No results found for this or any previous visit (from the past 240 hour(s)).       Radiology Studies: No results found.      Scheduled Meds:  aspirin EC  81 mg Oral Daily   atorvastatin  80 mg Oral Daily   carvedilol  6.25  mg Oral BID WC   isosorbide mononitrate  30 mg Oral Daily   losartan  25 mg Oral Daily   pantoprazole (PROTONIX) IV  40 mg Intravenous Q24H   Continuous Infusions:  Assessment & Plan:   Principal Problem:   Chest pain Active Problems:   Hypertensive urgency   Chronic systolic CHF (congestive heart failure) (HCC)   AKI (acute kidney injury) (HCC)   Chest pain history of CAD (CTO RCA, moderate disease LAD, LCx) Cards consulted Elevated tp likely demand ischemia from hypert. Urgency. Currently cp free 5/26 continue Imdur, Coreg, losartan, aspirin and statin         Chronic combined diastolic and systolic CHF (congestive heart failure) (HCC) Euvolemic No acute exacerbation Continue BP control Continue Imdur coreg, ARB 5/26 euvolemic.  Hold off on Lasix due to AKI.  Will discharge PTA Lasix 20 mg every other day upon discharge    AKI Possibly 2/2 hypotension 5/26 creatinine mildly down with increased p.o. intake/hydration Hold Lasix Renal ultrasound with possible RAS, vascular was consulted input was appreciated Obtaing cta to further evaluate this. Based on findings, can decide if need intervention Avoid nephrotoxic meds Monitor renal function.   Elevated Tp Secondary to demand ischemia       Hypertensive urgency Improved after meds Renal ultrasound reveals possible RAS, vascular consulted      DVT prophylaxis: scd Code Status:full Family Communication: none bedside Disposition Plan: TBD, going back home Status is: Inpatient Remains inpatient appropriate because: w/u pending. Vascular surgery w/u pending      LOS: 3 days   Time spent: 35 min     Nolberto Hanlon, MD [facility] Pager [phone]  If 7PM-7AM, please contact night-coverage 10/10/2021, 2:05 PM

## 2021-10-11 DIAGNOSIS — I160 Hypertensive urgency: Secondary | ICD-10-CM | POA: Diagnosis not present

## 2021-10-11 DIAGNOSIS — N179 Acute kidney failure, unspecified: Secondary | ICD-10-CM | POA: Diagnosis not present

## 2021-10-11 DIAGNOSIS — R072 Precordial pain: Secondary | ICD-10-CM | POA: Diagnosis not present

## 2021-10-11 DIAGNOSIS — I5022 Chronic systolic (congestive) heart failure: Secondary | ICD-10-CM | POA: Diagnosis not present

## 2021-10-11 LAB — RENAL FUNCTION PANEL
Albumin: 3.7 g/dL (ref 3.5–5.0)
Anion gap: 6 (ref 5–15)
BUN: 28 mg/dL — ABNORMAL HIGH (ref 6–20)
CO2: 26 mmol/L (ref 22–32)
Calcium: 9.5 mg/dL (ref 8.9–10.3)
Chloride: 108 mmol/L (ref 98–111)
Creatinine, Ser: 1.38 mg/dL — ABNORMAL HIGH (ref 0.61–1.24)
GFR, Estimated: >60 mL/min (ref >60)
Glucose, Bld: 100 mg/dL — ABNORMAL HIGH (ref 70–99)
Phosphorus: 4 mg/dL (ref 2.5–4.6)
Potassium: 4.7 mmol/L (ref 3.5–5.1)
Sodium: 140 mmol/L (ref 135–145)

## 2021-10-11 MED ORDER — FUROSEMIDE 20 MG PO TABS: 20.0000 mg | ORAL_TABLET | ORAL | 0 refills | Status: DC

## 2021-10-11 MED ORDER — PANTOPRAZOLE SODIUM 40 MG PO TBEC: 40.0000 mg | DELAYED_RELEASE_TABLET | Freq: Every day | ORAL | Status: DC

## 2021-10-11 MED ORDER — ISOSORBIDE MONONITRATE ER 30 MG PO TB24: 30.0000 mg | ORAL_TABLET | Freq: Every day | ORAL | 0 refills | Status: DC

## 2021-10-11 MED ORDER — CARVEDILOL 6.25 MG PO TABS: 6.2500 mg | ORAL_TABLET | Freq: Two times a day (BID) | ORAL | 0 refills | Status: DC

## 2021-10-11 NOTE — Discharge Summary (Signed)
Clarence [name] SJG:[id number] DOB: [birth year] DOA: 10/06/2021  PCP: Pcp, No  Admit date: 10/06/2021 Discharge date: 10/11/2021  Admitted From: home Disposition:  home  Recommendations for Outpatient Follow-up:  Follow up with PCP in 1 week Please obtain BMP/CBC in one week Follow-up with Dr. And cardiology in 1 week      Discharge Condition:Stable CODE STATUS:full  Diet recommendation: Heart Healthy  Brief/Interim Summary: Per MLY:YTKPTW Clarence [name] is a 51 y.o. male with medical history significant for Crohn's disease, hypertension, dyslipidemia, combined systolic and diastolic CHF, GERD and chronic kidney disease, presented to the emergency room with acute onset of left shoulder pain followed by midsternal chest pain felt as tightness, moderate in intensity with associated radiation to the left arm with numbness.Patient was doing groceries when the pain started.  He checked his blood pressure on returning home and it was elevated above 200.  As pain continues he called the EMS.  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 Held lasix. Obtained Renal ultrasound with possible RAS, vascular was consulted. Ordered CTA that did not show renal artery stenosis. Obtaing cta to further evaluate this. Based on findings, can decide if encourage patient to hydrate admitted today and start Lasix tomorrow Follow-up with PCP will call us  next week blood work done     Elevated Tp Secondary to demand ischemia          Hypertensive urgency Improved after medications Renal ultrasound reveals possible RAS, vascular consulted, CTA was obtained did not reveal renal artery stenosis       Discharge Diagnoses:  Principal Problem:   Chest pain Active Problems:   Hypertensive urgency   Chronic systolic CHF (congestive heart failure) ([place])   AKI (acute kidney injury) [facility])    Discharge Instructions  Discharge Instructions     Call MD for:  difficulty breathing, headache or visual disturbances   Complete by: As directed    Diet - low sodium heart healthy   Complete by: As directed    Discharge instructions   Complete by: As directed    Follow up with cardiology and pcp next week   Increase activity slowly   Complete by: As directed       Allergies as of 10/11/2021       Reactions   Ibuprofen Other (See Comments), Hives   Other reaction(s): GI Upset (intolerance) Hives   Neomycin-bacitracin Zn-polymyx    Other reaction(s): Other (See Comments)   Sulfacetamide Other (See Comments)        Medication List     STOP taking these medications    acetaminophen 325 MG tablet Commonly known as: TYLENOL       TAKE these medications    aspirin EC 81 MG tablet Take 1 tablet (81 mg total) by mouth daily. Swallow whole.   atorvastatin 80 MG tablet Commonly known as: LIPITOR Take 1 tablet (80 mg total) by mouth every evening.  carvedilol 6.25 MG tablet Commonly known as: COREG Take 1 tablet (6.25 mg total) by mouth 2 (two) times daily with a meal.   clopidogrel 75 MG tablet Commonly known as: PLAVIX Take 1 tablet (75 mg total) by mouth daily.   dapagliflozin propanediol 10 MG Tabs tablet Commonly known as: FARXIGA Take 1 tablet (10 mg total) by mouth daily.   furosemide 20 MG tablet Commonly known as: LASIX Take 1 tablet (20 mg total) by mouth every other day. What changed: medication  strength   isosorbide mononitrate 30 MG 24 hr tablet Commonly known as: IMDUR Take 1 tablet (30 mg total) by mouth daily. Start taking on: Oct 12, 2021   losartan 25 MG tablet Commonly known as: COZAAR Take 1 tablet (25 mg total) by mouth daily.   ondansetron 4 MG disintegrating tablet Commonly known as: ZOFRAN-ODT Take 4 mg by mouth every 8 (eight) hours as needed for nausea or vomiting.   PEPCID AC PO Take by mouth as needed.        Follow-up Information     End, Harrell Gave, MD Follow up in 1 week(s).   Specialty: Cardiology Contact information: [place] [address] [phone]         [place] Follow up in 1 week(s).   Specialty: Cardiology Contact information: [place] [address] [phone]               Allergies  Allergen Reactions   Ibuprofen Other (See Comments) and Hives    Other reaction(s): GI Upset (intolerance) Hives    Neomycin-Bacitracin Zn-Polymyx     Other reaction(s): Other (See Comments)   Sulfacetamide Other (See Comments)    Consultations: Cardiology, vascular surgery   Procedures/Studies: DG Chest 2 View  Result Date: 10/06/2021 CLINICAL DATA:  Chest pain. EXAM: CHEST - 2 VIEW COMPARISON:  Chest radiograph dated September 14, 2021 FINDINGS: The heart size and mediastinal contours are within normal limits. Both lungs are clear. The visualized skeletal structures are unremarkable. IMPRESSION: No active cardiopulmonary disease. Electronically Signed   By: Keane Police D.O.   On: 10/06/2021 20:51   CT HEAD WO CONTRAST (5MM)  Result Date: 09/14/2021 CLINICAL DATA:  Headache, new or worsening EXAM: CT HEAD WITHOUT CONTRAST TECHNIQUE: Contiguous axial images were obtained from the base of the skull through the vertex without intravenous contrast. RADIATION DOSE REDUCTION: This exam was performed according to the  departmental dose-optimization program which includes automated exposure control, adjustment of the mA and/or kV according to patient size and/or use of iterative reconstruction technique. COMPARISON:  None. FINDINGS: Brain: No evidence of acute infarction, hemorrhage, cerebral edema, mass, mass effect, or midline shift. No hydrocephalus or extra-axial fluid collection. Vascular: No hyperdense vessel. Skull: Normal. Negative for fracture or focal lesion. Sinuses/Orbits: No acute finding. Other: The mastoid air cells are well aerated. IMPRESSION: No acute intracranial process. No etiology is seen for the patient's headache. Electronically Signed   By: Merilyn Baba M.D.   On: 09/14/2021 23:03   CT Angio Chest Pulmonary Embolism (PE) W or WO Contrast  Result Date: 09/15/2021 CLINICAL DATA:  Shortness of breath and chest pain. Positive D-dimer. Clinical suspicion for pulmonary embolism. EXAM: CT ANGIOGRAPHY CHEST WITH CONTRAST TECHNIQUE: Multidetector CT imaging of the chest was performed using the standard protocol during bolus administration of intravenous contrast. Multiplanar CT image reconstructions and MIPs were obtained to evaluate the vascular anatomy. RADIATION  DOSE REDUCTION: This exam was performed according to the departmental dose-optimization program which includes automated exposure control, adjustment of the mA and/or kV according to patient size and/or use of iterative reconstruction technique. CONTRAST:  142m OMNIPAQUE IOHEXOL 350 MG/ML SOLN COMPARISON:  08/04/2021 FINDINGS: Cardiovascular: Satisfactory opacification of pulmonary arteries noted, and no pulmonary emboli identified. No evidence of thoracic aortic aneurysm. Mediastinum/Nodes: No masses or pathologically enlarged lymph nodes identified. Lungs/Pleura: No pulmonary mass, infiltrate, or effusion. Upper abdomen: No acute findings. Musculoskeletal: No suspicious bone lesions identified. Review of the MIP images confirms the above findings.  IMPRESSION: Negative. No evidence of pulmonary embolism or other active disease. Electronically Signed   By: JMarlaine HindM.D.   On: 09/15/2021 13:44   DG Chest Port 1 View  Result Date: 09/14/2021 CLINICAL DATA:  Chest pain, weakness and shortness of breath. EXAM: PORTABLE CHEST 1 VIEW COMPARISON:  September 09, 2021 FINDINGS: The cardiac silhouette is enlarged and unchanged in size. Very mild atelectasis is seen within the left lung base. Both lungs are otherwise clear. The visualized skeletal structures are unremarkable. IMPRESSION: 1. Stable cardiomegaly. 2. Very mild left basilar atelectasis. Electronically Signed   By: TVirgina NorfolkM.D.   On: 09/14/2021 21:30   [place]NAL ARTERY DUPLEX COMPLETE  Result Date: 10/08/2021 CLINICAL DATA:  51year old male with hypertensive emergency EXAM: RENAL/URINARY TRACT ULTRASOUND RENAL DUPLEX DOPPLER ULTRASOUND COMPARISON:  None Available. FINDINGS: Right Kidney: Length: 10.1 x 5.4 x 5.8 cm for a total volume of 165 mL. Echogenicity within normal limits. No mass or hydronephrosis visualized. Left Kidney: Length: 10.7 x 4.9 x 4.7 cm for a total volume of 128 mL. Echogenicity within normal limits. No mass or hydronephrosis visualized. Bladder: RENAL DUPLEX ULTRASOUND Right Renal Artery Velocities: (There are 2 right-sided renal arteries; the highest peak systolic velocity will be recorded) Origin:  227 cm/sec Mid:  143 cm/sec Hilum:  80 cm/sec Interlobar:  41 cm/sec Arcuate:  22 cm/sec Left Renal Artery Velocities: Origin:  135 cm/sec Mid:  129 cm/sec Hilum:  100 cm/sec Interlobar:  33 cm/sec Arcuate:  28 cm/sec Aortic Velocity:  88 cm/sec Right Renal-Aortic Ratios: Origin: 2.6 Mid:  1.6 Hilum: 0.9 Interlobar: 0.5 Arcuate: 0.3 Left Renal-Aortic Ratios: Origin: 1.5 Mid: 1.5 Hilum: 1.1 Interlobar: 0.4 Arcuate: 0.3 IMPRESSION: 1. Two right-sided renal arteries, single left-sided renal artery. 2. Elevated peak systolic velocity greater than 200 centimeters/second in 1 of  the 2 right renal arteries raises possibility of underlying stenosis. CT or MR arteriogram of the abdomen could further evaluate. 3. No evidence of left renal artery stenosis. 4. Normal sonographic appearance of the kidneys without evidence of hydronephrosis. Signed, HCriselda Peaches MD, RArtasVascular [facility] Electronically Signed   By: HJacqulynn CadetM.D.   On: 10/08/2021 16:11   ECHOCARDIOGRAM COMPLETE  Result Date: 10/07/2021    ECHOCARDIOGRAM REPORT   [facility] Name[facility]: [facility]2021 Medical Rec #:  [id number]        Height:       70.0 in Accession #:    [id number]       Weight:       216.0 lb Date of Birth:  925-Sep-1972         BSA:          2.157 m Patient Age:    89years          BP:  140/97 mmHg Patient Gender: M                 HR:           75 bpm. Exam Location:  [facility] Procedure: 2D Echo, Cardiac Doppler and Color Doppler Indications:     Elevated troponin  History:         Patient has prior history of Echocardiogram examinations, most                  recent 08/05/2021. CHF and Cardiomyopathy, Cardiac                  catheterization 08/07/21; Risk Factors:Former Smoker,                  Hypertension and Dyslipidemia.  Sonographer:     Rosalia Hammers Referring Phys:  [id number] [name] A MANSY Diagnosing Phys: Nelva Bush MD IMPRESSIONS  1. Left ventricular ejection fraction, by estimation, is 35 to 40%. The left ventricle has moderately decreased function. The left ventricle demonstrates global hypokinesis. There is moderate left ventricular hypertrophy. Left ventricular diastolic parameters are consistent with Grade I diastolic dysfunction (impaired relaxation).  2. Right ventricular systolic function is normal. The right ventricular size is normal.  3. Right atrial size was mildly dilated.  4. The mitral valve is normal in structure. Mild mitral valve regurgitation. No evidence of mitral stenosis.  5. The  aortic valve is tricuspid. Aortic valve regurgitation is not visualized. No aortic stenosis is present.  6. There is mild dilatation of the ascending aorta, measuring 40 mm. FINDINGS  Left Ventricle: Left ventricular ejection fraction, by estimation, is 35 to 40%. The left ventricle has moderately decreased function. The left ventricle demonstrates global hypokinesis. The left ventricular internal cavity size was normal in size. There is moderate left ventricular hypertrophy. Left ventricular diastolic parameters are consistent with Grade I diastolic dysfunction (impaired relaxation).  LV Wall Scoring: The entire inferior wall and posterior wall are akinetic. The entire anterior wall, antero-lateral wall, entire septum, apical lateral segment, and apex are hypokinetic. Right Ventricle: The right ventricular size is normal. No increase in right ventricular wall thickness. Right ventricular systolic function is normal. Left Atrium: Left atrial size was normal in size. Right Atrium: Right atrial size was mildly dilated. Pericardium: There is no evidence of pericardial effusion. Mitral Valve: The mitral valve is normal in structure. Mild mitral valve regurgitation. No evidence of mitral valve stenosis. MV peak gradient, 4.8 mmHg. The mean mitral valve gradient is 2.0 mmHg. Tricuspid Valve: The tricuspid valve is normal in structure. Tricuspid valve regurgitation is trivial. Aortic Valve: The aortic valve is tricuspid. Aortic valve regurgitation is not visualized. No aortic stenosis is present. Aortic valve mean gradient measures 3.0 mmHg. Aortic valve peak gradient measures 4.8 mmHg. Aortic valve area, by VTI measures 3.17 cm. Pulmonic Valve: The pulmonic valve was normal in structure. Pulmonic valve regurgitation is not visualized. No evidence of pulmonic stenosis. Aorta: The aortic root is normal in size and structure. There is mild dilatation of the ascending aorta, measuring 40 mm. Pulmonary Artery: The pulmonary  artery is of normal size. IAS/Shunts: The interatrial septum was not well visualized.  LEFT VENTRICLE PLAX 2D LVIDd:         5.24 cm LVIDs:         4.44 cm LV PW:         1.46 cm LV IVS:        1.33 cm LVOT diam:  2.10 cm LV SV:         59 LV SV Index:   27 LVOT Area:     3.46 cm  RIGHT VENTRICLE RV Basal diam:  2.90 cm LEFT ATRIUM             Index        RIGHT ATRIUM           Index LA diam:        3.60 cm 1.67 cm/m   RA Area:     19.10 cm LA Vol (A2C):   64.8 ml 30.04 ml/m  RA Volume:   53.40 ml  24.76 ml/m LA Vol (A4C):   53.5 ml 24.80 ml/m LA Biplane Vol: 61.0 ml 28.28 ml/m  AORTIC VALVE                    PULMONIC VALVE AV Area (Vmax):    2.76 cm     PV Vmax:       0.90 m/s AV Area (Vmean):   2.19 cm     PV Vmean:      63.400 cm/s AV Area (VTI):     3.17 cm     PV VTI:        0.161 m AV Vmax:           110.00 cm/s  PV Peak grad:  3.3 mmHg AV Vmean:          84.800 cm/s  PV Mean grad:  2.0 mmHg AV VTI:            0.186 m AV Peak Grad:      4.8 mmHg AV Mean Grad:      3.0 mmHg LVOT Vmax:         87.50 cm/s LVOT Vmean:        53.700 cm/s LVOT VTI:          0.170 m LVOT/AV VTI ratio: 0.91  AORTA Ao Root diam: 3.70 cm MITRAL VALVE MV Area (PHT): 4.29 cm    SHUNTS MV Area VTI:   2.84 cm    Systemic VTI:  0.17 m MV Peak grad:  4.8 mmHg    Systemic Diam: 2.10 cm MV Mean grad:  2.0 mmHg MV Vmax:       1.10 m/s MV Vmean:      53.2 cm/s MV Decel Time: 177 msec MV E velocity: 45.40 cm/s MV A velocity: 85.30 cm/s MV E/A ratio:  0.53 Harrell Gave End MD Electronically signed by Nelva Bush MD Signature Date/Time: 10/07/2021/10:42:48 AM    Final    CT Angio Abd/Pel w/ and/or w/o  Result Date: 10/10/2021 CLINICAL DATA:  Renal artery stenosis, hypertensive emergency EXAM: CTA ABDOMEN AND PELVIS WITHOUT AND WITH CONTRAST TECHNIQUE: Multidetector CT imaging of the abdomen and pelvis was performed using the standard protocol during bolus administration of intravenous contrast. Multiplanar reconstructed images  and MIPs were obtained and reviewed to evaluate the vascular anatomy. RADIATION DOSE REDUCTION: This exam was performed according to the departmental dose-optimization program which includes automated exposure control, adjustment of the mA and/or kV according to patient size and/or use of iterative reconstruction technique. CONTRAST:  121m OMNIPAQUE IOHEXOL 350 MG/ML SOLN COMPARISON:  Renal artery duplex ultrasound 10/08/2021; CT scan of the abdomen and pelvis 02/06/2020 FINDINGS: VASCULAR Aorta: Normal caliber aorta without aneurysm, dissection, vasculitis or significant stenosis. Celiac: Patent without evidence of aneurysm, dissection, vasculitis or significant stenosis. SMA: Patent without evidence of aneurysm, dissection, vasculitis or significant  stenosis. Renals: Both renal arteries are patent without evidence of aneurysm, dissection, vasculitis, fibromuscular dysplasia or significant stenosis. IMA: Patent without evidence of aneurysm, dissection, vasculitis or significant stenosis. Inflow: Patent without evidence of aneurysm, dissection, vasculitis or significant stenosis. Proximal Outflow: Bilateral common femoral and visualized portions of the superficial and profunda femoral arteries are patent without evidence of aneurysm, dissection, vasculitis or significant stenosis. Veins: No focal venous abnormality. Review of the MIP images confirms the above findings. NON-VASCULAR Lower chest: No acute abnormality. Hepatobiliary: No focal liver abnormality is seen. No gallstones, gallbladder wall thickening, or biliary dilatation. Pancreas: Unremarkable. No pancreatic ductal dilatation or surrounding inflammatory changes. Spleen: Normal in size without focal abnormality. Adrenals/Urinary Tract: Adrenal glands are unremarkable. Kidneys are normal, without renal calculi, focal solid lesion, or hydronephrosis. Bladder is unremarkable. Small low-attenuation lesions in the lower pole of the right kidney demonstrate no  interval change compared to prior imaging from September of 2021. While too small for accurate characterization these almost certainly represent benign cysts and no further follow-up is recommended. 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 There is some fecalization of the bowel contents. No evidence of bowel obstruction. Lymphatic: No suspicious lymphadenopathy. Reproductive: Prostate is unremarkable. Other: No ascites. Musculoskeletal: No acute or significant osseous findings. L5-S1 degenerative disc disease. IMPRESSION: VASCULAR 1. CT arteriography imaging demonstrates divergent findings from the prior renal artery ultrasound. There are only single renal arteries bilaterally with no evidence of stenosis, fibromuscular dysplasia or other abnormality. NON-VASCULAR 1. No acute abnormality within the abdomen or pelvis. 2. 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 Findings are similar compared to prior imaging from September of 2021. 3. L5-S1 degenerative disc disease. Signed, Criselda Peaches, MD, [place] [facility] Electronically Signed   By: Jacqulynn Cadet M.D.   On: 10/10/2021 15:46      Subjective: No chest pain or shortness of breath.  Discharge Exam: Vitals:   10/11/21 0817 10/11/21 1116  BP:  121/82  Pulse: 92 73  Resp:  17  Temp:  98.7 F (37.1 C)  SpO2:  98%   Vitals:   10/11/21 0350 10/11/21 0722 10/11/21 0817 10/11/21 1116  BP: (!) 136/95 128/74  121/82  Pulse: 62 (!) 58 92 73  Resp: 20 16  17   Temp: 98.2 F (36.8 C) 98.2 F (36.8 C)  98.7 F (37.1 C)  TempSrc: Oral   Oral  SpO2: 96% 99%  98%   Weight:      Height:        General: Pt is alert, awake, not in acute distress Cardiovascular: RRR, S1/S2 +, no rubs, no gallops Respiratory: CTA bilaterally, no wheezing, no rhonchi Abdominal: Soft, NT, ND, bowel sounds + Extremities: no edema, no cyanosis    The results of significant diagnostics from this hospitalization (including imaging, microbiology, ancillary and laboratory) are listed below for reference.     Microbiology: No results found for this or any previous visit (from the past 240 hour(s)).   Labs: BNP (last 3 results) Recent Labs    08/29/21 1453 09/09/21 0329 09/14/21 2104  BNP 122.6* 478.7* 062.3*   Basic Metabolic Panel: Recent Labs  Lab 10/06/21 2017 10/07/21 0309 10/08/21 7628 10/09/21 0615 10/10/21 0518 10/11/21 0510  NA 141 140 139  --   --  140  K 4.1 3.5 4.1 4.6 4.0 4.7  CL 109 108 107  --   --  108  CO2 26 25 24   --   --  26  GLUCOSE 111* 100* 108*  --   --  100*  BUN 27* 22* 34*  --   --  28*  CREATININE 1.20 1.03 1.36* 1.41* 1.37* 1.38*  CALCIUM 9.5 9.3 8.9  --   --  9.5  PHOS  --   --   --   --   --  4.0   Liver Function Tests: Recent Labs  Lab 10/11/21 0510  ALBUMIN 3.7   No results for input(s): LIPASE, AMYLASE in the last 168 hours. No results for input(s): AMMONIA in the last 168 hours. CBC: Recent Labs  Lab 10/06/21 2017 10/07/21 1615 10/08/21 0637  WBC 7.2 8.1 8.2  HGB 14.0 13.6 12.9*  HCT 41.0 39.3 37.5*  MCV 81.5 81.0 81.7  PLT 166 175 150   Cardiac Enzymes: No results for input(s): CKTOTAL, CKMB, CKMBINDEX, TROPONINI in the last 168 hours. BNP: Invalid input(s): POCBNP CBG: No results for input(s): GLUCAP in the last 168 hours. D-Dimer No results for input(s): DDIMER in the last 72 hours. Hgb A1c No results for input(s): HGBA1C in the last 72 hours. Lipid Profile No results for input(s): CHOL, HDL, LDLCALC, TRIG, CHOLHDL, LDLDIRECT in the last 72 hours. Thyroid function studies No results for  input(s): TSH, T4TOTAL, T3FREE, THYROIDAB in the last 72 hours.  Invalid input(s): FREET3 Anemia work up No results for input(s): VITAMINB12, FOLATE, FERRITIN, TIBC, IRON, RETICCTPCT in the last 72 hours. Urinalysis    Component Value Date/Time   COLORURINE YELLOW 09/14/2021 2233   APPEARANCEUR CLEAR 09/14/2021 2233   APPEARANCEUR Clear 10/24/2012 1000   LABSPEC 1.009 09/14/2021 2233   LABSPEC 1.015 10/24/2012 1000   PHURINE 5.0 09/14/2021 2233   GLUCOSEU 50 (A) 09/14/2021 2233   GLUCOSEU Negative 10/24/2012 1000   HGBUR NEGATIVE 09/14/2021 2233   BILIRUBINUR NEGATIVE 09/14/2021 2233   BILIRUBINUR Negative 10/24/2012 1000   KETONESUR NEGATIVE 09/14/2021 2233   PROTEINUR NEGATIVE 09/14/2021 2233   NITRITE NEGATIVE 09/14/2021 2233   LEUKOCYTESUR NEGATIVE 09/14/2021 2233   LEUKOCYTESUR Negative 10/24/2012 1000   Sepsis Labs Invalid input(s): PROCALCITONIN,  WBC,  LACTICIDVEN Microbiology No results found for this or any previous visit (from the past 240 hour(s)).   Time coordinating discharge: Over 30 minutes  SIGNED:   Nolberto Hanlon, MD  [facility] 10/11/2021, 3:08 PM Pager   If 7PM-7AM, please contact night-coverage [email or web address] Password [email or web address]

## 2021-10-11 NOTE — Progress Notes (Signed)
Order to discharge pt home.  Discharge instructions/AVS given to patient and reviewed - education provided as needed.  Pt advised to call PCP and/or come back to the hospital if there are any problems. Pt verbalized understanding.

## 2021-10-11 NOTE — Plan of Care (Signed)

## 2021-10-12 ENCOUNTER — Encounter: Payer: Self-pay | Admitting: Family

## 2021-10-13 ENCOUNTER — Other Ambulatory Visit: Payer: Self-pay | Admitting: Family

## 2021-10-13 ENCOUNTER — Encounter: Payer: Self-pay | Admitting: Internal Medicine

## 2021-10-13 MED ORDER — FUROSEMIDE 20 MG PO TABS: 20.0000 mg | ORAL_TABLET | ORAL | 5 refills | Status: DC

## 2021-10-13 NOTE — Progress Notes (Unsigned)
Patient ID: Clarence [name], male    DOB: 1971-04-16, 51 y.o.   MRN: [id number]  HPI  Clarence [name] is a 51 y/o male with a history of HTN, CKD, Crohn's, tobacco use and chronic heart failure.   Echo report from 10/07/21 reviewed and showed an EF of 35-40% along with moderate LVH and mild Clarence. Echo report from 08/05/21 reviewed and showed an EF of 30-35% along with moderate LVH, mild LAE and mild/moderate Clarence.   LHC done 08/07/21 showed: Diffuse moderate-severe, multivessel coronary artery disease, including chronic total occlusion of mid RCA, diffuse plaquing of LAD with up to 60% stenosis in the mid vessel, 60-70% proximal/mid LCx stenosis, and 90% lesion of OM1. Moderately elevated left heart filling pressures (LVEDP/PCWP 25 mmHg). Severely elevated right heart filling pressures (mean RA 16 mmHg, RVEDP 30 mmHg). Moderate-severe pulmonary hypertension (mean PAP 42 mmHg, PVR 4.0 WU). Moderately reduced Fick cardiac output/index (CO 4.2 L/min, CI 2.0 L/min/m^2).Marland Kitchen  Admitted 10/06/21 due to acute onset of left shoulder pain followed by midsternal chest pain felt as tightness, moderate in intensity with associated radiation to the left arm with numbness. Cardiology and vascular consults obtained. Obtained Renal ultrasound with possible RAS, ordered CTA that did not show renal artery stenosis. Elevated troponin thought to be due to demand ischemia. Placed on lasix QOD. Discharged after 5 days. 1 admission and 2 ED visits April 2023. Admitted 08/04/21 due to worsening dyspnea on exertion as well as orthopnea and lower extremity swelling. Lasix 80 IV given as well as corticosteroid and azithromycin/ceftriaxone. Pt had cardiac cath which showed mod-severe multivessel disease that will be treated medically. Cardiology consult obtained. Elevated troponin thought to be due to demand ischemia. Discharged after 6 days.   He presents today for a follow-up visit with a chief complaint of moderate fatigue with minimal  exertion. Describes this as chronic in nature. He has associated cough, shortness of breath, exertional chest pain & light-headedness along with this. He denies any difficulty sleeping, abdominal distention, palpitations, pedal edema or chest tightness.   Not weighing himself daily but every 2-3 days. He has not taken his medications yet today. Home BP is fluctuating from 130-190/ 80's-low 100's.   Has been trying to walk some but says that it makes him anxious to walk too far from home in case "something happens" and he needs to call 911. Is asking about joining a gym.   Past Medical History:  Diagnosis Date   CHF (congestive heart failure) (HCC)    Chronic kidney disease    Crohn's disease ([place])    Hypertension    Proteinuria    Past Surgical History:  Procedure Laterality Date   COLECTOMY     RIGHT/LEFT HEART CATH AND CORONARY ANGIOGRAPHY N/A 08/07/2021   Procedure: RIGHT/LEFT HEART CATH AND CORONARY ANGIOGRAPHY;  Surgeon: Nelva Bush, MD;  Location: [facility];  Service: Cardiovascular;  Laterality: N/A;   Family History  Problem Relation Age of Onset   Heart disease Maternal Uncle    Pancreatic cancer Maternal Uncle    Cancer Maternal Aunt    Social History   Tobacco Use   Smoking status: Former    Types: Cigars    Quit date: 08/13/2021    Years since quitting: 0.1   Smokeless tobacco: Not on file  Substance Use Topics   Alcohol use: Not Currently    Alcohol/week: 1.0 standard drink    Types: 1 Standard drinks or equivalent per week   Allergies  Allergen Reactions   Ibuprofen Other (See Comments) and Hives    Other reaction(s): GI Upset (intolerance) Hives    Neomycin-Bacitracin Zn-Polymyx     Other reaction(s): Other (See Comments)   Sulfacetamide Other (See Comments)   Prior to Admission medications   Medication Sig Start Date End Date Taking? Authorizing Provider  atorvastatin (LIPITOR) 80 MG tablet Take 1 tablet (80 mg total) by mouth every  evening. 09/22/21 10/22/21 Yes Dunn, Areta Haber, PA-C  carvedilol (COREG) 6.25 MG tablet Take 1 tablet (6.25 mg total) by mouth 2 (two) times daily with a meal. 10/11/21 11/10/21 Yes Amery, Gwynneth Albright, MD  clopidogrel (PLAVIX) 75 MG tablet Take 1 tablet (75 mg total) by mouth daily. 10/14/21  Yes Dunn, Areta Haber, PA-C  dapagliflozin propanediol (FARXIGA) 10 MG TABS tablet Take 1 tablet (10 mg total) by mouth daily. 09/22/21 10/22/21 Yes Dunn, Areta Haber, PA-C  Famotidine (PEPCID AC PO) Take by mouth as needed.   Yes [provider]  furosemide (LASIX) 20 MG tablet Take 1 tablet (20 mg total) by mouth every other day. 10/13/21  Yes Darylene Price A, [name]  isosorbide mononitrate (IMDUR) 30 MG 24 hr tablet Take 1 tablet (30 mg total) by mouth daily. 10/12/21 11/11/21 Yes Nolberto Hanlon, MD  losartan (COZAAR) 25 MG tablet Take 1 tablet (25 mg total) by mouth daily. 10/01/21 12/30/21 Yes Dunn, Areta Haber, PA-C  ondansetron (ZOFRAN-ODT) 4 MG disintegrating tablet Take 4 mg by mouth every 8 (eight) hours as needed for nausea or vomiting.   Yes [provider]   Review of Systems  Constitutional:  Positive for fatigue (easily). Negative for appetite change.  HENT:  Negative for congestion, postnasal drip and sore throat.   Eyes: Negative.   Respiratory:  Positive for cough and shortness of breath.   Cardiovascular:  Positive for chest pain (when walking). Negative for palpitations and leg swelling.  Gastrointestinal:  Negative for abdominal distention and abdominal pain.  Endocrine: Negative.   Genitourinary: Negative.   Musculoskeletal:  Negative for back pain and neck pain.  Skin: Negative.   Allergic/Immunologic: Negative.   Neurological:  Positive for light-headedness. Negative for dizziness.  Hematological:  Negative for adenopathy. Does not bruise/bleed easily.  Psychiatric/Behavioral:  Negative for dysphoric mood and sleep disturbance (sleeping on 4 pillows). The patient is not nervous/anxious.    Vitals:    10/14/21 0956 10/14/21 1007  BP: (!) 166/105 (!) 156/97  Pulse: (!) 57   Resp: 16   SpO2: 100%   Weight: 217 lb (98.4 kg)   Height: 5' 10"  (1.778 m)    Wt Readings from Last 3 Encounters:  10/14/21 217 lb (98.4 kg)  10/06/21 216 lb 0.8 oz (98 kg)  10/01/21 214 lb (97.1 kg)   Lab Results  Component Value Date   CREATININE 1.38 (H) 10/11/2021   CREATININE 1.37 (H) 10/10/2021   CREATININE 1.41 (H) 10/09/2021    Physical Exam Vitals and nursing note reviewed.  Constitutional:      Appearance: Normal appearance.  HENT:     Head: Normocephalic and atraumatic.  Cardiovascular:     Rate and Rhythm: Regular rhythm. Bradycardia present.  Pulmonary:     Effort: Pulmonary effort is normal. No respiratory distress.     Breath sounds: No wheezing or rales.  Abdominal:     General: There is no distension.     Palpations: Abdomen is soft.  Musculoskeletal:        General: No tenderness.     Cervical  back: Normal range of motion and neck supple.     Right lower leg: No edema.     Left lower leg: No edema.  Skin:    General: Skin is warm and dry.  Neurological:     General: No focal deficit present.     Mental Status: He is alert and oriented to person, place, and time.  Psychiatric:        Mood and Affect: Mood normal.        Behavior: Behavior normal.        Thought Content: Thought content normal.   Assessment & Plan:  1: Chronic heart failure with reduced ejection fraction- - NYHA class III - euvolemic today - not weighing daily but does have scales; encouraged to resume weighing daily so that he can call for an overnight weight gain of > 2 pounds or a weekly weight gain of > 5 pounds - weight up 5 pounds from last visit here 2 months ago - not adding salt and has been trying to read food labels for sodium content - reviewed the importance of keeping daily fluid intake to 60-64 ounces/ daily - saw cardiology (Dunn) 10/01/21 - on GDMT of farxiga, losartan & carvedilol -  developed hyperkalemia with spironolactone - will increase his losartan to 62m daily; discussed changing back to entresto but he'd like to finish his losartan up first;  - will check BMP/CBC today per discharge recommendation - has been walking some but admits that he gets anxious; discussed pulmonary rehab and he is interested in that so a referral was placed - BNP 09/14/21 was 178.4 - PharmD reconciled medications with the patient  2: HTN with CKD- - BP initially elevated (166/105) but had improved some upon recheck (156/97) - increasing losartan per above - will check BMP at next visit since titrating up losartan - [facility] - BMP 10/11/21 showed sodium 140, potassium 4.7, creatinine 1.38 & GFR >60  3: Tobacco use- - continues to not smoke - continued cessation discussed    Medication list that he brought was reviewed.   Return in 2 weeks, sooner if needed.

## 2021-10-13 NOTE — Progress Notes (Signed)
Refilled lasix

## 2021-10-14 ENCOUNTER — Other Ambulatory Visit: Payer: Self-pay | Admitting: *Deleted

## 2021-10-14 ENCOUNTER — Other Ambulatory Visit: Payer: Self-pay

## 2021-10-14 ENCOUNTER — Ambulatory Visit (HOSPITAL_BASED_OUTPATIENT_CLINIC_OR_DEPARTMENT_OTHER): Payer: Managed Care, Other (non HMO) | Admitting: Family

## 2021-10-14 ENCOUNTER — Encounter: Payer: Self-pay | Admitting: Family

## 2021-10-14 ENCOUNTER — Other Ambulatory Visit
Admission: RE | Admit: 2021-10-14 | Discharge: 2021-10-14 | Disposition: A | Discharge status: Home / Self Care | Payer: Managed Care, Other (non HMO) | Source: Ambulatory Visit | Attending: Family | Admitting: Family

## 2021-10-14 VITALS — BP 156/97 | HR 57 | Resp 16 | Ht 70.0 in | Wt 217.0 lb

## 2021-10-14 DIAGNOSIS — I130 Hypertensive heart and chronic kidney disease with heart failure and stage 1 through stage 4 chronic kidney disease, or unspecified chronic kidney disease: Secondary | ICD-10-CM | POA: Insufficient documentation

## 2021-10-14 DIAGNOSIS — Z720 Tobacco use: Secondary | ICD-10-CM | POA: Insufficient documentation

## 2021-10-14 DIAGNOSIS — Z79899 Other long term (current) drug therapy: Secondary | ICD-10-CM | POA: Insufficient documentation

## 2021-10-14 DIAGNOSIS — I5022 Chronic systolic (congestive) heart failure: Secondary | ICD-10-CM | POA: Insufficient documentation

## 2021-10-14 DIAGNOSIS — E875 Hyperkalemia: Secondary | ICD-10-CM | POA: Insufficient documentation

## 2021-10-14 DIAGNOSIS — K5090 Crohn's disease, unspecified, without complications: Secondary | ICD-10-CM | POA: Insufficient documentation

## 2021-10-14 DIAGNOSIS — R778 Other specified abnormalities of plasma proteins: Secondary | ICD-10-CM | POA: Insufficient documentation

## 2021-10-14 DIAGNOSIS — N189 Chronic kidney disease, unspecified: Secondary | ICD-10-CM | POA: Insufficient documentation

## 2021-10-14 DIAGNOSIS — I10 Essential (primary) hypertension: Secondary | ICD-10-CM

## 2021-10-14 DIAGNOSIS — I5020 Unspecified systolic (congestive) heart failure: Secondary | ICD-10-CM

## 2021-10-14 DIAGNOSIS — I2510 Atherosclerotic heart disease of native coronary artery without angina pectoris: Secondary | ICD-10-CM | POA: Insufficient documentation

## 2021-10-14 DIAGNOSIS — I5042 Chronic combined systolic (congestive) and diastolic (congestive) heart failure: Secondary | ICD-10-CM

## 2021-10-14 LAB — BASIC METABOLIC PANEL
Anion gap: 10 (ref 5–15)
BUN: 31 mg/dL — ABNORMAL HIGH (ref 6–20)
CO2: 25 mmol/L (ref 22–32)
Calcium: 9.9 mg/dL (ref 8.9–10.3)
Chloride: 108 mmol/L (ref 98–111)
Creatinine, Ser: 1.45 mg/dL — ABNORMAL HIGH (ref 0.61–1.24)
GFR, Estimated: 58 mL/min — ABNORMAL LOW (ref >60)
Glucose, Bld: 104 mg/dL — ABNORMAL HIGH (ref 70–99)
Potassium: 4.8 mmol/L (ref 3.5–5.1)
Sodium: 143 mmol/L (ref 135–145)

## 2021-10-14 LAB — CBC WITH DIFFERENTIAL/PLATELET
Abs Immature Granulocytes: 0.01 10*3/uL (ref 0.00–0.07)
Basophils Absolute: 0 10*3/uL (ref 0.0–0.1)
Basophils Relative: 1 %
Eosinophils Absolute: 0.4 10*3/uL (ref 0.0–0.5)
Eosinophils Relative: 6 %
HCT: 39.3 % (ref 39.0–52.0)
Hemoglobin: 13.2 g/dL (ref 13.0–17.0)
Immature Granulocytes: 0 %
Lymphocytes Relative: 19 %
Lymphs Abs: 1.1 10*3/uL (ref 0.7–4.0)
MCH: 28.1 pg (ref 26.0–34.0)
MCHC: 33.6 g/dL (ref 30.0–36.0)
MCV: 83.8 fL (ref 80.0–100.0)
Monocytes Absolute: 0.7 10*3/uL (ref 0.1–1.0)
Monocytes Relative: 12 %
Neutro Abs: 3.8 10*3/uL (ref 1.7–7.7)
Neutrophils Relative %: 62 %
Platelets: 176 10*3/uL (ref 150–400)
RBC: 4.69 MIL/uL (ref 4.22–5.81)
RDW: 14.5 % (ref 11.5–15.5)
WBC: 6 10*3/uL (ref 4.0–10.5)
nRBC: 0 % (ref 0.0–0.2)

## 2021-10-14 MED ORDER — CLOPIDOGREL BISULFATE 75 MG PO TABS: 75.0000 mg | ORAL_TABLET | Freq: Every day | ORAL | 0 refills | Status: DC

## 2021-10-14 NOTE — Patient Instructions (Addendum)
Resume weighing daily and call for an overnight weight gain of 3 pounds or more or a weekly weight gain of more than 5 pounds.   If you have voicemail, please make sure your mailbox is cleaned out so that we may leave a message and please make sure to listen to any voicemails.     Start taking 2 losartan tablets every day. This will be 61m total. Plan to change to entresto once your current losartan is finished.    I've placed a pulmonology rehab referral and they will contact you.

## 2021-10-14 NOTE — Progress Notes (Signed)
[facility] - PHARMACIST COUNSELING NOTE  Guideline-Directed Medical Therapy/Evidence Based Medicine  ACE/ARB/ARNI: Sacubitril-valsartan 24-26 mg twice daily Beta Blocker: Carvedilol 6.25 mg twice daily Aldosterone Antagonist: none Diuretic: Furosemide 20 mg daily SGLT2i: Dapagliflozin 10 mg daily  Adherence Assessment  Do you ever forget to take your medication? [x] Yes [] No  Do you ever skip doses due to side effects? [] Yes [x] No  Do you have trouble affording your medicines? [] Yes [x] No  Are you ever unable to pick up your medication due to transportation difficulties? [] Yes [x] No  Do you ever stop taking your medications because you don't believe they are helping? [] Yes [x] No  Do you check your weight daily? [] Yes [x] No   Adherence strategy: timing of day  Barriers to obtaining medications: n/a  Vital signs: HR 57, BP 166/105, weight (pounds) 217 lb ECHO: Date 10/07/21, EF 35-40%, notes moderate LVH Cath: Date 08/07/21, EF 30-35%     Latest Ref Rng & Units 10/14/2021   10:57 AM 10/11/2021    5:10 AM 10/10/2021    5:18 AM  BMP  Glucose 70 - 99 mg/dL 104   100     BUN 6 - 20 mg/dL 31   28     Creatinine 0.61 - 1.24 mg/dL 1.45   1.38   1.37    Sodium 135 - 145 mmol/L 143   140     Potassium 3.5 - 5.1 mmol/L 4.8   4.7   4.0    Chloride 98 - 111 mmol/L 108   108     CO2 22 - 32 mmol/L 25   26     Calcium 8.9 - 10.3 mg/dL 9.9   9.5       Past Medical History:  Diagnosis Date   CHF (congestive heart failure) (HCC)    Chronic kidney disease    Crohn's disease ([place])    Hypertension    Proteinuria     ASSESSMENT 51 year old male who presents to the HF clinic for follow up. PMH relevant to Crohn's disease, HTN, dyslipidemia, HFrEF, GERD, and CKD. On GDMT carvedilol 6.25 mg BID, dapagliflozin, and losartan. Reports infrequently misses medications. Blood pressure 166/105. Reports home readings in the 200s with lowest SBP of  130-140.  Recent ED Visit (past 6 months): Date - 5/27, [facility] - chest pain  PLAN Recommend switching to [facility] after patient completes supply of losartan 50 mg. Recommend BMP  Time spent: 20 minutes  Wynelle Cleveland, PharmD Pharmacy Resident  10/14/2021 3:45 PM   Current Outpatient Medications:    atorvastatin (LIPITOR) 80 MG tablet, Take 1 tablet (80 mg total) by mouth every evening., Disp: 30 tablet, Rfl: 0   carvedilol (COREG) 6.25 MG tablet, Take 1 tablet (6.25 mg total) by mouth 2 (two) times daily with a meal., Disp: 60 tablet, Rfl: 0   clopidogrel (PLAVIX) 75 MG tablet, Take 1 tablet (75 mg total) by mouth daily., Disp: 90 tablet, Rfl: 0   dapagliflozin propanediol (FARXIGA) 10 MG TABS tablet, Take 1 tablet (10 mg total) by mouth daily., Disp: 30 tablet, Rfl: 0   Famotidine (PEPCID AC PO), Take by mouth as needed., Disp: , Rfl:    furosemide (LASIX) 20 MG tablet, Take 1 tablet (20 mg total) by mouth every other day., Disp: 15 tablet, Rfl: 5   isosorbide mononitrate (IMDUR) 30 MG 24 hr tablet, Take 1 tablet (30 mg total) by mouth daily., Disp: 30 tablet, Rfl: 0   losartan (COZAAR)  25 MG tablet, Take 1 tablet (25 mg total) by mouth daily. (Patient taking differently: Take 50 mg by mouth daily.), Disp: 90 tablet, Rfl: 3   ondansetron (ZOFRAN-ODT) 4 MG disintegrating tablet, Take 4 mg by mouth every 8 (eight) hours as needed for nausea or vomiting., Disp: , Rfl:    COUNSELING POINTS/CLINICAL PEARLS    DRUGS TO CAUTION IN HEART FAILURE  Drug or Class Mechanism  Analgesics NSAIDs COX-2 inhibitors Glucocorticoids  Sodium and water retention, increased systemic vascular resistance, decreased response to diuretics   Diabetes Medications Metformin Thiazolidinediones Rosiglitazone (Avandia) Pioglitazone (Actos) DPP4 Inhibitors Saxagliptin (Onglyza) Sitagliptin (Januvia)   Lactic acidosis Possible calcium channel blockade   Unknown  Antiarrhythmics Class I   Flecainide Disopyramide Class III Sotalol Other Dronedarone  Negative inotrope, proarrhythmic   Proarrhythmic, beta blockade  Negative inotrope  Antihypertensives Alpha Blockers Doxazosin Calcium Channel Blockers Diltiazem Verapamil Nifedipine Central Alpha Adrenergics Moxonidine Peripheral Vasodilators Minoxidil  Increases renin and aldosterone  Negative inotrope    Possible sympathetic withdrawal  Unknown  Anti-infective Itraconazole Amphotericin B  Negative inotrope Unknown  Hematologic Anagrelide Cilostazol   Possible inhibition of PD IV Inhibition of PD III causing arrhythmias  Neurologic/Psychiatric Stimulants Anti-Seizure Drugs Carbamazepine Pregabalin Antidepressants Tricyclics Citalopram Parkinsons Bromocriptine Pergolide Pramipexole Antipsychotics Clozapine Antimigraine Ergotamine Methysergide Appetite suppressants Bipolar Lithium  Peripheral alpha and beta agonist activity  Negative inotrope and chronotrope Calcium channel blockade  Negative inotrope, proarrhythmic Dose-dependent QT prolongation  Excessive serotonin activity/valvular damage Excessive serotonin activity/valvular damage Unknown  IgE mediated hypersensitivy, calcium channel blockade  Excessive serotonin activity/valvular damage Excessive serotonin activity/valvular damage Valvular damage  Direct myofibrillar degeneration, adrenergic stimulation  Antimalarials Chloroquine Hydroxychloroquine Intracellular inhibition of lysosomal enzymes  Urologic Agents Alpha Blockers Doxazosin Prazosin Tamsulosin Terazosin  Increased renin and aldosterone  Adapted from Page Carleene Overlie, et al. "Drugs That May Cause or Exacerbate Heart Failure: A Scientific Statement from the American Heart  Association." Circulation 2016; 134:e32-e69. DOI: 10.1161/CIR.0000000000000426   MEDICATION ADHERENCES TIPS AND STRATEGIES Taking medication as prescribed improves patient outcomes in  heart failure (reduces hospitalizations, improves symptoms, increases survival) Side effects of medications can be managed by decreasing doses, switching agents, stopping drugs, or adding additional therapy. Please let someone in the [facility] know if you have having bothersome side effects so we can modify your regimen. Do not alter your medication regimen without talking to [place].  Medication reminders can help patients remember to take drugs on time. If you are missing or forgetting doses you can try linking behaviors, using pill boxes, or an electronic reminder like an alarm on your phone or an app. Some people can also get automated phone calls as medication reminders.

## 2021-10-15 ENCOUNTER — Telehealth: Payer: Self-pay | Admitting: Internal Medicine

## 2021-10-15 NOTE — Telephone Encounter (Signed)
Received more forms from [employer] to be updated Forms completed, faxed and scanned  Patient informed

## 2021-10-19 ENCOUNTER — Other Ambulatory Visit: Payer: Self-pay | Admitting: Physician Assistant

## 2021-10-20 ENCOUNTER — Other Ambulatory Visit: Payer: Self-pay

## 2021-10-20 ENCOUNTER — Encounter: Payer: Self-pay | Admitting: *Deleted

## 2021-10-20 ENCOUNTER — Inpatient Hospital Stay
Admission: EM | Admit: 2021-10-20 | Discharge: 2021-10-23 | DRG: 280 | Disposition: A | Discharge status: Home / Self Care | Payer: Managed Care, Other (non HMO) | Attending: Internal Medicine | Admitting: Internal Medicine

## 2021-10-20 ENCOUNTER — Emergency Department: Payer: Managed Care, Other (non HMO)

## 2021-10-20 DIAGNOSIS — Z882 Allergy status to sulfonamides status: Secondary | ICD-10-CM

## 2021-10-20 DIAGNOSIS — I255 Ischemic cardiomyopathy: Secondary | ICD-10-CM | POA: Diagnosis present

## 2021-10-20 DIAGNOSIS — I161 Hypertensive emergency: Secondary | ICD-10-CM | POA: Diagnosis present

## 2021-10-20 DIAGNOSIS — I5023 Acute on chronic systolic (congestive) heart failure: Secondary | ICD-10-CM | POA: Diagnosis present

## 2021-10-20 DIAGNOSIS — Z79899 Other long term (current) drug therapy: Secondary | ICD-10-CM

## 2021-10-20 DIAGNOSIS — R079 Chest pain, unspecified: Secondary | ICD-10-CM | POA: Diagnosis present

## 2021-10-20 DIAGNOSIS — I2582 Chronic total occlusion of coronary artery: Secondary | ICD-10-CM | POA: Diagnosis present

## 2021-10-20 DIAGNOSIS — K5090 Crohn's disease, unspecified, without complications: Secondary | ICD-10-CM | POA: Diagnosis present

## 2021-10-20 DIAGNOSIS — Z6830 Body mass index (BMI) 30.0-30.9, adult: Secondary | ICD-10-CM

## 2021-10-20 DIAGNOSIS — K219 Gastro-esophageal reflux disease without esophagitis: Secondary | ICD-10-CM | POA: Diagnosis present

## 2021-10-20 DIAGNOSIS — R778 Other specified abnormalities of plasma proteins: Secondary | ICD-10-CM | POA: Diagnosis present

## 2021-10-20 DIAGNOSIS — E669 Obesity, unspecified: Secondary | ICD-10-CM | POA: Diagnosis present

## 2021-10-20 DIAGNOSIS — N182 Chronic kidney disease, stage 2 (mild): Secondary | ICD-10-CM | POA: Diagnosis present

## 2021-10-20 DIAGNOSIS — I10 Essential (primary) hypertension: Secondary | ICD-10-CM | POA: Diagnosis present

## 2021-10-20 DIAGNOSIS — Z87891 Personal history of nicotine dependence: Secondary | ICD-10-CM

## 2021-10-20 DIAGNOSIS — Z9049 Acquired absence of other specified parts of digestive tract: Secondary | ICD-10-CM

## 2021-10-20 DIAGNOSIS — I25110 Atherosclerotic heart disease of native coronary artery with unstable angina pectoris: Secondary | ICD-10-CM | POA: Diagnosis present

## 2021-10-20 DIAGNOSIS — Z883 Allergy status to other anti-infective agents status: Secondary | ICD-10-CM

## 2021-10-20 DIAGNOSIS — I272 Pulmonary hypertension, unspecified: Secondary | ICD-10-CM

## 2021-10-20 DIAGNOSIS — I130 Hypertensive heart and chronic kidney disease with heart failure and stage 1 through stage 4 chronic kidney disease, or unspecified chronic kidney disease: Secondary | ICD-10-CM | POA: Diagnosis present

## 2021-10-20 DIAGNOSIS — I5022 Chronic systolic (congestive) heart failure: Secondary | ICD-10-CM

## 2021-10-20 DIAGNOSIS — I214 Non-ST elevation (NSTEMI) myocardial infarction: Principal | ICD-10-CM | POA: Diagnosis present

## 2021-10-20 DIAGNOSIS — Z8249 Family history of ischemic heart disease and other diseases of the circulatory system: Secondary | ICD-10-CM

## 2021-10-20 DIAGNOSIS — I2729 Other secondary pulmonary hypertension: Secondary | ICD-10-CM | POA: Diagnosis present

## 2021-10-20 DIAGNOSIS — E785 Hyperlipidemia, unspecified: Secondary | ICD-10-CM | POA: Diagnosis present

## 2021-10-20 DIAGNOSIS — I2489 Other forms of acute ischemic heart disease: Secondary | ICD-10-CM

## 2021-10-20 DIAGNOSIS — Z7902 Long term (current) use of antithrombotics/antiplatelets: Secondary | ICD-10-CM

## 2021-10-20 DIAGNOSIS — I248 Other forms of acute ischemic heart disease: Secondary | ICD-10-CM

## 2021-10-20 DIAGNOSIS — Z886 Allergy status to analgesic agent status: Secondary | ICD-10-CM

## 2021-10-20 LAB — PROTIME-INR
INR: 1 (ref 0.8–1.2)
Prothrombin Time: 12.8 seconds (ref 11.4–15.2)

## 2021-10-20 LAB — BRAIN NATRIURETIC PEPTIDE: B Natriuretic Peptide: 300.6 pg/mL — ABNORMAL HIGH (ref 0.0–100.0)

## 2021-10-20 LAB — BASIC METABOLIC PANEL
Anion gap: 8 (ref 5–15)
BUN: 21 mg/dL — ABNORMAL HIGH (ref 6–20)
CO2: 24 mmol/L (ref 22–32)
Calcium: 9.4 mg/dL (ref 8.9–10.3)
Chloride: 106 mmol/L (ref 98–111)
Creatinine, Ser: 1.3 mg/dL — ABNORMAL HIGH (ref 0.61–1.24)
GFR, Estimated: >60 mL/min (ref >60)
Glucose, Bld: 135 mg/dL — ABNORMAL HIGH (ref 70–99)
Potassium: 3.7 mmol/L (ref 3.5–5.1)
Sodium: 138 mmol/L (ref 135–145)

## 2021-10-20 LAB — CBC
HCT: 39.9 % (ref 39.0–52.0)
Hemoglobin: 13.6 g/dL (ref 13.0–17.0)
MCH: 28.1 pg (ref 26.0–34.0)
MCHC: 34.1 g/dL (ref 30.0–36.0)
MCV: 82.4 fL (ref 80.0–100.0)
Platelets: 195 10*3/uL (ref 150–400)
RBC: 4.84 MIL/uL (ref 4.22–5.81)
RDW: 14.6 % (ref 11.5–15.5)
WBC: 8.6 10*3/uL (ref 4.0–10.5)
nRBC: 0 % (ref 0.0–0.2)

## 2021-10-20 LAB — MAGNESIUM: Magnesium: 2 mg/dL (ref 1.7–2.4)

## 2021-10-20 LAB — TROPONIN I (HIGH SENSITIVITY)
Troponin I (High Sensitivity): 45 ng/L — ABNORMAL HIGH (ref <18)
Troponin I (High Sensitivity): 191 ng/L (ref <18)

## 2021-10-20 LAB — APTT: aPTT: 32 seconds (ref 24–36)

## 2021-10-20 MED ORDER — FUROSEMIDE 20 MG PO TABS
20.0000 mg | ORAL_TABLET | ORAL | Status: DC
  Administered 2021-10-21 – 2021-10-23 (×2): 20 mg via ORAL
  Filled 2021-10-20 (×2): qty 1

## 2021-10-20 MED ORDER — HEPARIN (PORCINE) 25000 UT/250ML-% IV SOLN
1300.0000 [IU]/h | INTRAVENOUS | Status: AC
  Administered 2021-10-20 – 2021-10-22 (×3): 1300 [IU]/h via INTRAVENOUS
  Filled 2021-10-20 (×3): qty 250

## 2021-10-20 MED ORDER — ISOSORBIDE MONONITRATE ER 30 MG PO TB24
30.0000 mg | ORAL_TABLET | Freq: Every day | ORAL | Status: DC
  Administered 2021-10-20 – 2021-10-23 (×4): 30 mg via ORAL
  Filled 2021-10-20 (×4): qty 1

## 2021-10-20 MED ORDER — CARVEDILOL 6.25 MG PO TABS
6.2500 mg | ORAL_TABLET | Freq: Two times a day (BID) | ORAL | Status: DC
  Administered 2021-10-20 – 2021-10-23 (×6): 6.25 mg via ORAL
  Filled 2021-10-20 (×6): qty 1

## 2021-10-20 MED ORDER — FAMOTIDINE 20 MG PO TABS
20.0000 mg | ORAL_TABLET | ORAL | Status: AC
  Administered 2021-10-20: 20 mg via ORAL
  Filled 2021-10-20: qty 1

## 2021-10-20 MED ORDER — HEPARIN BOLUS VIA INFUSION
4000.0000 [IU] | Freq: Once | INTRAVENOUS | Status: AC
  Administered 2021-10-20: 4000 [IU] via INTRAVENOUS
  Filled 2021-10-20: qty 4000

## 2021-10-20 MED ORDER — ASPIRIN 81 MG PO CHEW
324.0000 mg | CHEWABLE_TABLET | Freq: Once | ORAL | Status: AC
  Administered 2021-10-20: 324 mg via ORAL
  Filled 2021-10-20: qty 4

## 2021-10-20 MED ORDER — LOSARTAN POTASSIUM 50 MG PO TABS
25.0000 mg | ORAL_TABLET | ORAL | Status: AC
Start: 2021-10-20 — End: 2021-10-20
  Administered 2021-10-20: 25 mg via ORAL
  Filled 2021-10-20: qty 1

## 2021-10-20 NOTE — ED Provider Notes (Signed)
[facility] Provider Note    Event Date/Time   First MD Initiated Contact with Patient 10/20/21 2258     (approximate)   History   Chest Pain and Shortness of Breath   HPI  Clarence [name] is a 51 y.o. male  CHF, HTN, HDL, Crohn's disease, CAD, GERD and CKD who presents for evaluation of some left-sided chest discomfort associate with shortness of breath that he states started about 45 minutes prior to arrival.  He states he is feeling much better now and currently chest pain-free.  He states he has had episodes like this on and off every 2 or 3 days since he was hospitalized at the end of last month.  States he has not taken his medicines today as he typically takes in the evening but otherwise has been compliant with all his medications.  He denies any tobacco use, EtOH use or illicit drug use.  He denies any cough, fevers, headache, earache, sore throat, abdominal pain, back pain, nausea, vomiting, diarrhea rash or urinary symptoms.  Denies any significant weight gain or leg swelling other he states he feels his neck was a little swollen earlier today.  No recent injuries or falls.  He did take some ibuprofen earlier today which he feels helped his symptoms but has not taken his aspirin or any other medications.  No other alleviating or aggravating factors.    Past Medical History:  Diagnosis Date   CHF (congestive heart failure) (HCC)    Chronic kidney disease    Crohn's disease ([place])    Hypertension    Proteinuria      Physical Exam  Triage Vital Signs: ED Triage Vitals  Enc Vitals Group     BP 10/20/21 1937 (!) 221/148     Pulse Rate 10/20/21 1937 97     Resp 10/20/21 1937 20     Temp 10/20/21 1937 98.6 F (37 C)     Temp Source 10/20/21 1937 Oral     SpO2 10/20/21 1937 99 %     Weight 10/20/21 1937 215 lb (97.5 kg)     Height 10/20/21 1937 5' 10"  (1.778 m)     Head Circumference --      Peak Flow --      Pain Score 10/20/21 1942 10      Pain Loc --      Pain Edu? --      Excl. in [place]? --     Most recent vital signs: Vitals:   10/20/21 2213 10/20/21 2300  BP: (!) 184/123 (!) 194/110  Pulse: 66 96  Resp: 18 19  Temp: 98 F (36.7 C)   SpO2: 98% 94%    General: Awake, no distress.  CV:  Good peripheral perfusion.  2+ radial pulse.  Systolic murmur. Resp:  Normal effort.  Clear bilaterally. Abd:  No distention.  Soft. Other:  Minimal lower extremity edema.   ED Results / Procedures / Treatments  Labs (all labs ordered are listed, but only abnormal results are displayed) Labs Reviewed  BASIC METABOLIC PANEL - Abnormal; Notable for the following components:      Result Value   Glucose, Bld 135 (*)    BUN 21 (*)    Creatinine, Ser 1.30 (*)    All other components within normal limits  TROPONIN I (HIGH SENSITIVITY) - Abnormal; Notable for the following components:   Troponin I (High Sensitivity) 45 (*)    All other components within normal limits  TROPONIN I (HIGH SENSITIVITY) - Abnormal; Notable for the following components:   Troponin I (High Sensitivity) 191 (*)    All other components within normal limits  CBC  MAGNESIUM  BRAIN NATRIURETIC PEPTIDE  PROTIME-INR  APTT     EKG  ECG is remarkable for sinus rhythm with a ventricular rate of 96, unremarkable intervals, normal axis. Nonspecific ST change in V2 as well as T wave inversion in V6 and nonspecific ST changes.  Lead III and aVL without other clear evidence of acute ischemia or significant arrhythmia.   RADIOLOGY Chest reviewed by myself shows no focal consoidation, effusion, edema, pneumothorax or other clear acute thoracic process. I also reviewed radiology interpretation and agree with findings described.    PROCEDURES:  Critical Care performed:  Yes  .Critical Care Performed by: Lucrezia Starch, MD Authorized by: Lucrezia Starch, MD   Critical care provider statement:    Critical care time (minutes):  30   Critical care was  necessary to treat or prevent imminent or life-threatening deterioration of the following conditions:  Circulatory failure   Critical care was time spent personally by me on the following activities:  Development of treatment plan with patient or surrogate, discussions with consultants, evaluation of patient's response to treatment, examination of patient, ordering and review of laboratory studies, ordering and review of radiographic studies, ordering and performing treatments and interventions, pulse oximetry, re-evaluation of patient's condition and review of old charts   I assumed direction of critical care for this patient from another provider in my specialty: no   .1-3 Lead EKG Interpretation Performed by: Lucrezia Starch, MD Authorized by: Lucrezia Starch, MD     Interpretation: normal     ECG rate assessment: normal     Rhythm: sinus rhythm     Ectopy: none     Conduction: normal    The patient is on the cardiac monitor to evaluate for evidence of arrhythmia and/or significant heart rate changes.   MEDICATIONS ORDERED IN ED: Medications  carvedilol (COREG) tablet 6.25 mg (6.25 mg Oral Given 10/20/21 2319)  isosorbide mononitrate (IMDUR) 24 hr tablet 30 mg (30 mg Oral Given 10/20/21 2319)  aspirin chewable tablet 324 mg (324 mg Oral Given 10/20/21 2318)  losartan (COZAAR) tablet 25 mg (25 mg Oral Given 10/20/21 2319)  famotidine (PEPCID) tablet 20 mg (20 mg Oral Given 10/20/21 2319)     IMPRESSION / MDM / ASSESSMENT AND PLAN / ED COURSE  I reviewed the triage vital signs and the nursing notes. Patient's presentation is most consistent with severe exacerbation of chronic illness.                               Differential diagnosis includes, but is not limited to, occlusion MI, hypertensive emergency causing NSTEMI, pneumonia, CHF exacerbation and peptic ulcer disease or esophagitis with a lower suspicion based on patient's history for pneumonia, traumatic injury, PE or  dissection.  ECG is remarkable for sinus rhythm with a ventricular rate of 96, unremarkable intervals, normal axis. Nonspecific ST change in V2 as well as T wave inversion in V6 and nonspecific ST changes.  Lead III and aVL without other clear evidence of acute ischemia or significant arrhythmia.  Chest reviewed by myself shows no focal consoidation, effusion, edema, pneumothorax or other clear acute thoracic process. I also reviewed radiology interpretation and agree with findings described.  BMP shows stable kidney function with a  creatinine of 1.3 compared to 1.456 days ago without any other significant electrolyte or metabolic derangements.  CBC without leukocytosis or acute anemia.  Troponin uptrending from 45-1 91.  It was 290 and 221 13 days ago.  I suspect most likely demand ischemia in the setting of poorly controlled blood pressure.  On arrival patient's BP was 221/148.  It is calm down to 190s on my assessment.  Given patient's not had any of his home blood pressure medicines will reorder these in addition to ASA and heparin.  I will admit to medicine service for further evaluation and management.      FINAL CLINICAL IMPRESSION(S) / ED DIAGNOSES   Final diagnoses:  NSTEMI (non-ST elevated myocardial infarction) [facility])  Hypertensive emergency     Rx / DC Orders   ED Discharge Orders     None        Note:  This document was prepared using Dragon voice recognition software and may include unintentional dictation errors.   Lucrezia Starch, MD 10/20/21 [phone]

## 2021-10-20 NOTE — Progress Notes (Signed)
ANTICOAGULATION CONSULT NOTE - Initial Consult  Pharmacy Consult for Heparin  Indication: chest pain/ACS  Allergies  Allergen Reactions   Ibuprofen Other (See Comments) and Hives    Other reaction(s): GI Upset (intolerance) Hives    Neomycin-Bacitracin Zn-Polymyx     Other reaction(s): Other (See Comments)   Sulfacetamide Other (See Comments)    Patient Measurements: Height: 5' 10"  (177.8 cm) Weight: 97.5 kg (215 lb) IBW/kg (Calculated) : 73 Heparin Dosing Weight: 93.1 kg   Vital Signs: Temp: 98 F (36.7 C) (06/05 2213) Temp Source: Oral (06/05 2213) BP: 194/110 (06/05 2300) Pulse Rate: 96 (06/05 2300)  Labs: Recent Labs    10/20/21 1943 10/20/21 2215  HGB 13.6  --   HCT 39.9  --   PLT 195  --   CREATININE 1.30*  --   TROPONINIHS 45* 191*    Estimated Creatinine Clearance: 77.8 mL/min (A) (by C-G formula based on SCr of 1.3 mg/dL (H)).   Medical History: Past Medical History:  Diagnosis Date   CHF (congestive heart failure) ([place])    Chronic kidney disease    Crohn's disease ([place])    Hypertension    Proteinuria     Medications:  (Not in a hospital admission)   Assessment: Pharmacy consulted to dose heparin in this 51 year old male admitted with ACS/NSTEMI.  No prior anticoag noted.   CrCl = 77.8 ml/min  Goal of Therapy:  Heparin level 0.3-0.7 units/ml Monitor platelets by anticoagulation protocol: Yes   Plan:  Give 4000 units bolus x 1 Start heparin infusion at 1300 units/hr Check anti-Xa level in 6 hours and daily while on heparin Continue to monitor H&H and platelets  Chayil Gantt D 10/20/2021,11:35 PM

## 2021-10-20 NOTE — ED Triage Notes (Signed)
Pt reports chest pain and sob for 45 minutes.  No cough.  Pt n/v/d.  Pt has pain in center of chest.  Pt alert  speech clear

## 2021-10-21 DIAGNOSIS — E785 Hyperlipidemia, unspecified: Secondary | ICD-10-CM | POA: Diagnosis present

## 2021-10-21 DIAGNOSIS — I255 Ischemic cardiomyopathy: Secondary | ICD-10-CM

## 2021-10-21 DIAGNOSIS — Z8249 Family history of ischemic heart disease and other diseases of the circulatory system: Secondary | ICD-10-CM | POA: Diagnosis not present

## 2021-10-21 DIAGNOSIS — I248 Other forms of acute ischemic heart disease: Secondary | ICD-10-CM | POA: Diagnosis not present

## 2021-10-21 DIAGNOSIS — I5022 Chronic systolic (congestive) heart failure: Secondary | ICD-10-CM | POA: Diagnosis not present

## 2021-10-21 DIAGNOSIS — Z79899 Other long term (current) drug therapy: Secondary | ICD-10-CM | POA: Diagnosis not present

## 2021-10-21 DIAGNOSIS — I5023 Acute on chronic systolic (congestive) heart failure: Secondary | ICD-10-CM | POA: Diagnosis present

## 2021-10-21 DIAGNOSIS — N182 Chronic kidney disease, stage 2 (mild): Secondary | ICD-10-CM | POA: Diagnosis present

## 2021-10-21 DIAGNOSIS — Z7902 Long term (current) use of antithrombotics/antiplatelets: Secondary | ICD-10-CM | POA: Diagnosis not present

## 2021-10-21 DIAGNOSIS — Z883 Allergy status to other anti-infective agents status: Secondary | ICD-10-CM | POA: Diagnosis not present

## 2021-10-21 DIAGNOSIS — Z87891 Personal history of nicotine dependence: Secondary | ICD-10-CM | POA: Diagnosis not present

## 2021-10-21 DIAGNOSIS — I272 Pulmonary hypertension, unspecified: Secondary | ICD-10-CM

## 2021-10-21 DIAGNOSIS — I161 Hypertensive emergency: Secondary | ICD-10-CM | POA: Diagnosis present

## 2021-10-21 DIAGNOSIS — I214 Non-ST elevation (NSTEMI) myocardial infarction: Secondary | ICD-10-CM | POA: Diagnosis present

## 2021-10-21 DIAGNOSIS — K219 Gastro-esophageal reflux disease without esophagitis: Secondary | ICD-10-CM | POA: Diagnosis present

## 2021-10-21 DIAGNOSIS — I2729 Other secondary pulmonary hypertension: Secondary | ICD-10-CM | POA: Diagnosis present

## 2021-10-21 DIAGNOSIS — E669 Obesity, unspecified: Secondary | ICD-10-CM | POA: Diagnosis present

## 2021-10-21 DIAGNOSIS — Z882 Allergy status to sulfonamides status: Secondary | ICD-10-CM | POA: Diagnosis not present

## 2021-10-21 DIAGNOSIS — I2582 Chronic total occlusion of coronary artery: Secondary | ICD-10-CM | POA: Diagnosis present

## 2021-10-21 DIAGNOSIS — K5090 Crohn's disease, unspecified, without complications: Secondary | ICD-10-CM | POA: Diagnosis present

## 2021-10-21 DIAGNOSIS — I130 Hypertensive heart and chronic kidney disease with heart failure and stage 1 through stage 4 chronic kidney disease, or unspecified chronic kidney disease: Secondary | ICD-10-CM | POA: Diagnosis present

## 2021-10-21 DIAGNOSIS — I25110 Atherosclerotic heart disease of native coronary artery with unstable angina pectoris: Secondary | ICD-10-CM | POA: Diagnosis present

## 2021-10-21 DIAGNOSIS — Z9049 Acquired absence of other specified parts of digestive tract: Secondary | ICD-10-CM | POA: Diagnosis not present

## 2021-10-21 DIAGNOSIS — I10 Essential (primary) hypertension: Secondary | ICD-10-CM | POA: Diagnosis not present

## 2021-10-21 DIAGNOSIS — Z6830 Body mass index (BMI) 30.0-30.9, adult: Secondary | ICD-10-CM | POA: Diagnosis not present

## 2021-10-21 DIAGNOSIS — I2489 Other forms of acute ischemic heart disease: Secondary | ICD-10-CM

## 2021-10-21 DIAGNOSIS — Z886 Allergy status to analgesic agent status: Secondary | ICD-10-CM | POA: Diagnosis not present

## 2021-10-21 LAB — CBC
HCT: 34.9 % — ABNORMAL LOW (ref 39.0–52.0)
Hemoglobin: 12 g/dL — ABNORMAL LOW (ref 13.0–17.0)
MCH: 28.2 pg (ref 26.0–34.0)
MCHC: 34.4 g/dL (ref 30.0–36.0)
MCV: 82.1 fL (ref 80.0–100.0)
Platelets: 159 10*3/uL (ref 150–400)
RBC: 4.25 MIL/uL (ref 4.22–5.81)
RDW: 14.6 % (ref 11.5–15.5)
WBC: 8 10*3/uL (ref 4.0–10.5)
nRBC: 0 % (ref 0.0–0.2)

## 2021-10-21 LAB — BASIC METABOLIC PANEL
Anion gap: 7 (ref 5–15)
BUN: 20 mg/dL (ref 6–20)
CO2: 24 mmol/L (ref 22–32)
Calcium: 9.2 mg/dL (ref 8.9–10.3)
Chloride: 107 mmol/L (ref 98–111)
Creatinine, Ser: 1.05 mg/dL (ref 0.61–1.24)
GFR, Estimated: >60 mL/min (ref >60)
Glucose, Bld: 133 mg/dL — ABNORMAL HIGH (ref 70–99)
Potassium: 3.6 mmol/L (ref 3.5–5.1)
Sodium: 138 mmol/L (ref 135–145)

## 2021-10-21 LAB — HEPARIN LEVEL (UNFRACTIONATED)
Heparin Unfractionated: 0.56 IU/mL (ref 0.30–0.70)
Heparin Unfractionated: 0.4 IU/mL (ref 0.30–0.70)

## 2021-10-21 LAB — TROPONIN I (HIGH SENSITIVITY)
Troponin I (High Sensitivity): 587 ng/L (ref <18)
Troponin I (High Sensitivity): 330 ng/L (ref <18)

## 2021-10-21 MED ORDER — ONDANSETRON HCL 4 MG/2ML IJ SOLN: 4.0000 mg | Freq: Four times a day (QID) | INTRAMUSCULAR | Status: DC | PRN

## 2021-10-21 MED ORDER — LOSARTAN POTASSIUM 50 MG PO TABS
50.0000 mg | ORAL_TABLET | Freq: Every day | ORAL | Status: DC
  Administered 2021-10-21: 50 mg via ORAL
  Filled 2021-10-21: qty 1

## 2021-10-21 MED ORDER — NITROGLYCERIN 0.4 MG SL SUBL: 0.4000 mg | SUBLINGUAL_TABLET | SUBLINGUAL | Status: DC | PRN

## 2021-10-21 MED ORDER — NITROGLYCERIN 2 % TD OINT: 0.5000 [in_us] | TOPICAL_OINTMENT | Freq: Four times a day (QID) | TRANSDERMAL | Status: DC

## 2021-10-21 MED ORDER — ATORVASTATIN CALCIUM 80 MG PO TABS
80.0000 mg | ORAL_TABLET | Freq: Every evening | ORAL | Status: DC
  Administered 2021-10-21 – 2021-10-22 (×2): 80 mg via ORAL
  Filled 2021-10-21: qty 1
  Filled 2021-10-21: qty 4

## 2021-10-21 MED ORDER — SACUBITRIL-VALSARTAN 24-26 MG PO TABS
1.0000 | ORAL_TABLET | Freq: Two times a day (BID) | ORAL | Status: DC
  Administered 2021-10-21 – 2021-10-22 (×4): 1 via ORAL
  Filled 2021-10-21 (×4): qty 1

## 2021-10-21 MED ORDER — DAPAGLIFLOZIN PROPANEDIOL 10 MG PO TABS
10.0000 mg | ORAL_TABLET | Freq: Every day | ORAL | Status: DC
  Administered 2021-10-21 – 2021-10-23 (×3): 10 mg via ORAL
  Filled 2021-10-21 (×3): qty 1

## 2021-10-21 MED ORDER — ASPIRIN 81 MG PO CHEW
81.0000 mg | CHEWABLE_TABLET | Freq: Every day | ORAL | Status: DC
  Administered 2021-10-22 – 2021-10-23 (×2): 81 mg via ORAL
  Filled 2021-10-21 (×2): qty 1

## 2021-10-21 MED ORDER — PANTOPRAZOLE SODIUM 40 MG PO TBEC
40.0000 mg | DELAYED_RELEASE_TABLET | Freq: Every day | ORAL | Status: DC
  Administered 2021-10-21 – 2021-10-23 (×3): 40 mg via ORAL
  Filled 2021-10-21 (×3): qty 1

## 2021-10-21 MED ORDER — CLOPIDOGREL BISULFATE 75 MG PO TABS: 75.0000 mg | ORAL_TABLET | Freq: Every day | ORAL | Status: DC

## 2021-10-21 MED ORDER — ACETAMINOPHEN 325 MG PO TABS
650.0000 mg | ORAL_TABLET | ORAL | Status: DC | PRN
  Administered 2021-10-22 – 2021-10-23 (×2): 650 mg via ORAL
  Filled 2021-10-21 (×2): qty 2

## 2021-10-21 MED ORDER — CLOPIDOGREL BISULFATE 75 MG PO TABS
75.0000 mg | ORAL_TABLET | Freq: Every day | ORAL | Status: DC
  Administered 2021-10-21 – 2021-10-23 (×3): 75 mg via ORAL
  Filled 2021-10-21 (×3): qty 1

## 2021-10-21 NOTE — Assessment & Plan Note (Signed)
Renal function at baseline.  Monitor closely with diuresis.

## 2021-10-21 NOTE — Assessment & Plan Note (Signed)
Multivessel coronary artery disease Cath 07/2021: Diffuse moderate-severe, multivessel coronary artery disease, including chronic total occlusion of mid RCA, diffuse plaquing of LAD with up to 60% stenosis in the mid vessel, 60-70% proximal/mid LCx stenosis, and 90% lesion of OM1. Continue heparin infusion started in the emergency room Continue Lipitor, Cozaar, [place]  Nitropaste Cardiology consult

## 2021-10-21 NOTE — Progress Notes (Signed)
ANTICOAGULATION CONSULT NOTE - Initial Consult  Pharmacy Consult for Heparin  Indication: chest pain/ACS  Allergies  Allergen Reactions   Ibuprofen Other (See Comments) and Hives    Other reaction(s): GI Upset (intolerance) Hives    Neomycin-Bacitracin Zn-Polymyx     Other reaction(s): Other (See Comments)   Sulfacetamide Other (See Comments)    Patient Measurements: Height: 5' 10"  (177.8 cm) Weight: 97.5 kg (215 lb) IBW/kg (Calculated) : 73 Heparin Dosing Weight: 93.1 kg   Vital Signs: Temp: 98.7 F (37.1 C) (06/06 1100) Temp Source: Oral (06/06 1100) BP: 130/86 (06/06 1100) Pulse Rate: 51 (06/06 1100)  Labs: Recent Labs    10/20/21 1943 10/20/21 2215 10/20/21 2317 10/21/21 0545 10/21/21 0850 10/21/21 1140  HGB 13.6  --   --  12.0*  --   --   HCT 39.9  --   --  34.9*  --   --   PLT 195  --   --  159  --   --   APTT  --   --  32  --   --   --   LABPROT  --   --  12.8  --   --   --   INR  --   --  1.0  --   --   --   HEPARINUNFRC  --   --   --  0.56  --  0.40  CREATININE 1.30*  --   --   --  1.05  --   TROPONINIHS 45* 191*  --   --  587*  --      Estimated Creatinine Clearance: 96.4 mL/min (by C-G formula based on SCr of 1.05 mg/dL).   Medical History: Past Medical History:  Diagnosis Date   CHF (congestive heart failure) ([place])    Chronic kidney disease    Crohn's disease ([place])    Hypertension    Proteinuria     Medications:  (Not in a hospital admission)  Assessment: Pharmacy consulted to dose heparin in this 51 year old male admitted with ACS/NSTEMI.  No prior anticoag noted.   CrCl = 77.8 ml/min  Goal of Therapy:  Heparin level 0.3-0.7 units/ml Monitor platelets by anticoagulation protocol: Yes   Plan:  6/6:  HL @ 2229 = 0.56, therapeutic X 1 6/6 HL@1140  = 0.4, therapeutic x 2 Will continue pt on current rate and recheck HL with AM labs on 6/7 @ [place] 10/21/2021,12:13 PM

## 2021-10-21 NOTE — Consult Note (Signed)
Cardiology Consultation:   Patient ID: KIARA KEEP MRN: [id number]; DOB: [birth year]  Admit date: 10/20/2021 Date of Consult: 10/21/2021  PCP:  Merryl Hacker, No   [facility] Providers Cardiologist:  Nelva Bush, MD        Patient Profile:   Clarence [name] is a 51 y.o. male with a hx of coronary artery disease medically managed,HFrEF, secondary to mixed ischemic cardiomyopathy, pulmonary hypertension, Crohn's disease status post colectomy with ostomy, CKD stage II, and essential hypertension who is being seen 10/21/2021 for the evaluation of atypical chest pain and shortness of breath at the request of Dr. Damita Dunnings.  History of Present Illness:   Clarence [name] is followed by Dr. Harrell Gave in for several cardiac issues.  He was admitted on 07/2021 with progressive dyspnea over the preceding several months and was found to have a new cardiomyopathy with atypical pneumonia.  High-sensitivity troponin peaked at 89 and he was found to have a BNP of 559.  Echocardiogram revealed LVEF of 30 to 35%, global hypokinesia, moderate LVH, grade 2 diastolic dysfunction, normal right ventricular systolic function pressure 15 mmHg.  He was diuresed and placed on goal-directed medical therapy with improvement from symptomology.  He subsequently underwent a right and left heart catheterization that showed diffuse moderate to severe multivessel coronary artery disease including CTA of the mid RCA, diffuse plaque of the LAD and up to 60% stenosis in the mid vessel, 60 to 70% proximal/mid left circumflex stenosis, and 90% stenosis of the OM1.  He was found to have moderately elevated left heart filling pressures, severely elevated right heart filling pressures, moderate to severe pulmonary hypertension, moderately reduced cardiac output as outlined.  It was suspected his cardiomyopathy was a combination of his ischemic and nonischemic cardiomyopathy complicated by significant pulmonary hypertension right heart failure  the may have been due to underlying pulmonary disease.  He had hospital follow-up on 08/29/2021 where he still had complaints of fatigue and an episode of near syncope.  On examination his blood pressure was noted to be soft at 98/76 at the time with labs showing acute on chronic CKD and hyperkalemia with a potassium of 6.0.  This unfortunately led him to be evaluated in the emergency department on 414 with persistent hypokalemia and AKA and he was treated with IV fluid and Lokelma.  He was then admitted to the hospital on 425 through 427 with acute on chronic HFrEF secondary to accelerated hypertension.  He was found to have a BNP of 178, high-sensitivity troponin peaked at 139 and was felt to be demand ischemia.  Goal-directed medical therapy was escalated in addition of losartan and the continuation of carvedilol, BiDil, Farxiga, spironolactone, and Lasix.  He arrived again to the emergency department on 430 and was admitted through 5 1 with complaints of weakness and near syncope.  High-sensitivity troponin peaked at 80, BNP of 178, CT of the head showed no acute abnormalities.  Chest x-ray with stable cardiomegaly and very mild left basilar atelectasis.  D-dimer was 1.99 with a CTA of the chest revealing no evidence of PE.  His symptoms had improved with IV fluid and presentation was felt to be in the setting of hypotension following escalating GDMT.  At that time he had medication changes made of the discontinuation of BiDil and losartan.  He had a hospital follow-up again on 10/01/2021 and was doing reasonably well he was again restarted on losartan at 25 mg daily with additional changes of the spironolactone being stopped due to his  prior issues of hyperkalemia.  He then again presented to the emergency department on 10/06/2021 with complaints of left arm pain and chest tightness.  He stated it happened last evening around 8 PM he was walking to the store and started feeling a sharp left shoulder pain  that radiated into the center of his chest.  It felt like a tightness and he can feel his heart pounding.  He denied any associated symptoms.  He had gone home checked his blood pressure systolic blood pressure was greater than 200 and EMS was called he had reported compliance with his medications but takes the majority of his medications at night but denied any other associated symptoms.  With blood pressure better controlled during his hospitalization and no further having any episodes of chest discomfort he was discharged home on Imdur, Coreg, losartan, aspirin, Lipitor, and was restarted on his Lasix 20 mg every other day.  During his hospitalization he did undergo an ultrasound of the renal arteries which showed 2 right-sided renal arteries, single left-sided renal artery, elevated peak systolic velocity greater than 200 and one of the 2 right renal arteries which raises the possibility of underlying stenosis, CT or MR arteriogram of the abdomen to further evaluate, no evidence of left renal artery stenosis, normal appearance of the kidneys without evidence of hydronephrosis.  CT angiogram of the abdomen and pelvis revealed single renal arteries bilaterally with no evidence of stenosis.  He was discharged on 527 with routine follow-up as well as to continue following with heart failure clinic.  He presented again to the emergency department on 10/20/2021 for evaluation of left-sided chest discomfort with associated shortness of breath and palpitations he states had started about 45 minutes prior to his arrival to the emergency department. He did become chest pain-free while in the emergency department around 2200 last evening. He states he has episodes like this off and on every 2 to 3 days since he was hospitalized previously, he also notes that he has not taken his medications prior to his arrival as he typically takes all of his medications in the evening. He had been keeping a log of his blood pressure over  the weekend and noted an upswing in his blood pressure. He denies any current chest pain or worsening shortness of breath on exam. Denies any swelling, abdominal bloating, does endorse feeling full during meals sooner than before. Endorses having episodes of palpitations that cause worsening shortness of breath in the evening and he thinks chest tightness when lying flat in bed so he continues to sleep on 2-3 pillows. He states that his shortness of breath is close to his baseline of not being able to walk to the mailbox at home or sometimes having to sit in the car to recover after coming out of the store.  Initial vital signs: Blood pressure 221/148, pulse 97, resp rate 20, temp 98.6  Medications given: carvedilol 6.25 mg bid, Imdur 30 mg daily, aspirin 324 mg daily, losartan 25 mg daily, famotidine 20 mg daily, heparin drip  Pertinent labs: wbc 8.6, hgb 13.6, hct 39.9, plts 195, na 138, k+3.7, BUN 21, creat 1.30, GFR >60, HS troponion 45 and 191, BNP 300.6, Mg 2.0   Past Medical History:  Diagnosis Date   CHF (congestive heart failure) ([facility])    Chronic kidney disease    Crohn's disease ([place])    Hypertension    Proteinuria     Past Surgical History:  Procedure Laterality Date   COLECTOMY  RIGHT/LEFT HEART CATH AND CORONARY ANGIOGRAPHY N/A 08/07/2021   Procedure: RIGHT/LEFT HEART CATH AND CORONARY ANGIOGRAPHY;  Surgeon: Nelva Bush, MD;  Location: [facility];  Service: Cardiovascular;  Laterality: N/A;     Home Medications:  Prior to Admission medications   Medication Sig Start Date End Date Taking? Authorizing Provider  atorvastatin (LIPITOR) 80 MG tablet TAKE 1 TABLET BY MOUTH EVERY EVENING 10/20/21  Yes Dunn, Areta Haber, PA-C  carvedilol (COREG) 6.25 MG tablet Take 1 tablet (6.25 mg total) by mouth 2 (two) times daily with a meal. 10/11/21 11/10/21 Yes Amery, Gwynneth Albright, MD  clopidogrel (PLAVIX) 75 MG tablet Take 1 tablet (75 mg total) by mouth daily. 10/14/21  Yes Dunn, Areta Haber,  PA-C  Famotidine (PEPCID AC PO) Take by mouth as needed.   Yes [provider]  FARXIGA 10 MG TABS tablet TAKE 1 TABLET BY MOUTH EVERY DAY 10/20/21  Yes Dunn, Areta Haber, PA-C  furosemide (LASIX) 20 MG tablet Take 1 tablet (20 mg total) by mouth every other day. 10/13/21  Yes Darylene Price A, [name]  isosorbide mononitrate (IMDUR) 30 MG 24 hr tablet Take 1 tablet (30 mg total) by mouth daily. 10/12/21 11/11/21 Yes Nolberto Hanlon, MD  losartan (COZAAR) 25 MG tablet Take 1 tablet (25 mg total) by mouth daily. Patient taking differently: Take 50 mg by mouth daily. 10/01/21 12/30/21 Yes Dunn, Areta Haber, PA-C  ondansetron (ZOFRAN-ODT) 4 MG disintegrating tablet Take 4 mg by mouth every 8 (eight) hours as needed for nausea or vomiting.   Yes [provider]    Inpatient Medications: Scheduled Meds:  atorvastatin  80 mg Oral QPM   carvedilol  6.25 mg Oral BID WC   clopidogrel  75 mg Oral Daily   dapagliflozin propanediol  10 mg Oral Daily   furosemide  20 mg Oral QODAY   isosorbide mononitrate  30 mg Oral Daily   losartan  50 mg Oral Daily   Continuous Infusions:  heparin 1,300 Units/hr (10/20/21 2340)   PRN Meds: acetaminophen, nitroGLYCERIN, ondansetron (ZOFRAN) IV  Allergies:    Allergies  Allergen Reactions   Ibuprofen Other (See Comments) and Hives    Other reaction(s): GI Upset (intolerance) Hives    Neomycin-Bacitracin Zn-Polymyx     Other reaction(s): Other (See Comments)   Sulfacetamide Other (See Comments)    Social History:   Social History   Socioeconomic History   Marital status: Single    Spouse name: Not on file   Number of children: Not on file   Years of education: Not on file   Highest education level: Not on file  Occupational History   Not on file  Tobacco Use   Smoking status: Former    Types: Cigars    Quit date: 08/13/2021    Years since quitting: 0.1   Smokeless tobacco: Not on file  Vaping Use   Vaping Use: Never used  Substance and Sexual  Activity   Alcohol use: Not Currently    Alcohol/week: 1.0 standard drink    Types: 1 Standard drinks or equivalent per week   Drug use: Never   Sexual activity: Not on file  Other Topics Concern   Not on file  Social History Narrative   Not on file   Social Determinants of Health   Financial Resource Strain: Not on file  Food Insecurity: Not on file  Transportation Needs: Not on file  Physical Activity: Not on file  Stress: Not on file  Social Connections: Not  on file  Intimate Partner Violence: Not on file    Family History:    Family History  Problem Relation Age of Onset   Heart disease Maternal Uncle    Pancreatic cancer Maternal Uncle    Cancer Maternal Aunt      ROS:  Please see the history of present illness.  Review of Systems  Constitutional:  Positive for malaise/fatigue.  HENT: Negative.    Eyes: Negative.   Respiratory:  Positive for shortness of breath.   Cardiovascular:  Positive for palpitations and orthopnea.  Gastrointestinal: Negative.   Genitourinary: Negative.   Musculoskeletal: Negative.   Skin: Negative.   Neurological:  Positive for weakness.  Endo/Heme/Allergies: Negative.   Psychiatric/Behavioral: Negative.     All other ROS reviewed and negative.     Physical Exam/Data:   Vitals:   10/21/21 0330 10/21/21 0421 10/21/21 0530 10/21/21 0630  BP: 116/81  130/88 106/62  Pulse: (!) 50 62 62 (!) 53  Resp: 16 14 16 16   Temp:      TempSrc:      SpO2: 96% 98% 95% 95%  Weight:      Height:       No intake or output data in the 24 hours ending 10/21/21 0742    10/20/2021    7:42 PM 10/20/2021    7:37 PM 10/14/2021    9:56 AM  Last 3 Weights  Weight (lbs) 215 lb 215 lb 217 lb  Weight (kg) 97.523 kg 97.523 kg 98.431 kg     Body mass index is 30.85 kg/m.  General:  Well nourished, well developed, in no acute distress HEENT: normal Neck: no JVD appreciated Vascular: No carotid bruits; Distal pulses 2+ bilaterally Cardiac:  normal S1,  S2; RRR; I/VI systolic murmur, without rubs, or gallops Lungs:  clear to auscultation bilaterally, no wheezing, rhonchi or rales. Respirations are unlabored on room air. Abd: soft, nontender, no hepatomegaly, bowel sounds present in all 4 quadrants Ext: trace edema pretibial bilaterally  Musculoskeletal:  No deformities, BUE and BLE strength normal and equal Skin: warm and dry  Neuro:  CNs 2-12 intact, no focal abnormalities noted Psych:  Normal affect   EKG:  The EKG was personally reviewed and demonstrates:  SR rate of 96, nonspecific ST changes, inverted t waves V6, RBBB Telemetry:  Telemetry was personally reviewed and demonstrates:  SR, multifocal PVC,s, RBBB, rate varying from 40-70's throughout the night  Relevant CV Studies:  Echocardiogram completed 10/07/2021 1. Left ventricular ejection fraction, by estimation, is 35 to 40%. The  left ventricle has moderately decreased function. The left ventricle  demonstrates global hypokinesis. There is moderate left ventricular  hypertrophy. Left ventricular diastolic  parameters are consistent with Grade I diastolic dysfunction (impaired  relaxation).   2. Right ventricular systolic function is normal. The right ventricular  size is normal.   3. Right atrial size was mildly dilated.   4. The mitral valve is normal in structure. Mild mitral valve  regurgitation. No evidence of mitral stenosis.   5. The aortic valve is tricuspid. Aortic valve regurgitation is not  visualized. No aortic stenosis is present.   6. There is mild dilatation of the ascending aorta, measuring 40 mm.   Right/Left Heart Cath and Coronary Angiography Diffuse moderate-severe, multivessel coronary artery disease, including chronic total occlusion of mid RCA, diffuse plaquing of LAD with up to 60% stenosis in the mid vessel, 60-70% proximal/mid LCx stenosis, and 90% lesion of OM1. Moderately elevated left heart filling pressures (  LVEDP/PCWP 25 mmHg). Severely  elevated right heart filling pressures (mean RA 16 mmHg, RVEDP 30 mmHg). Moderate-severe pulmonary hypertension (mean PAP 42 mmHg, PVR 4.0 WU). Moderately reduced Fick cardiac output/index (CO 4.2 L/min, CI 2.0 L/min/m^2).Marland Kitchen  Echocardiogram completed 08/05/2021  1. Left ventricular ejection fraction, by estimation, is 30 to 35%. The  left ventricle has moderately decreased function. The left ventricle  demonstrates global hypokinesis. There is moderate left ventricular  hypertrophy. Left ventricular diastolic  parameters are consistent with Grade II diastolic dysfunction  (pseudonormalization).   2. Right ventricular systolic function is normal. The right ventricular  size is normal. Tricuspid regurgitation signal is inadequate for assessing  PA pressure.   3. Left atrial size was mildly dilated.   4. The mitral valve is abnormal. Mild to moderate mitral valve  regurgitation. No evidence of mitral stenosis.   5. The aortic valve has an indeterminant number of cusps. Aortic valve  regurgitation is not visualized. No aortic stenosis is present.   6. The inferior vena cava is dilated in size with <50% respiratory  variability, suggesting right atrial pressure of 15 mmHg.   Laboratory Data:  High Sensitivity Troponin:   Recent Labs  Lab 10/06/21 2234 10/07/21 0309 10/07/21 0659 10/20/21 1943 10/20/21 2215  TROPONINIHS 81* 290* 221* 45* 191*     Chemistry Recent Labs  Lab 10/14/21 1057 10/20/21 1943 10/20/21 2317  NA 143 138  --   K 4.8 3.7  --   CL 108 106  --   CO2 25 24  --   GLUCOSE 104* 135*  --   BUN 31* 21*  --   CREATININE 1.45* 1.30*  --   CALCIUM 9.9 9.4  --   MG  --   --  2.0  GFRNONAA 58* >60  --   ANIONGAP 10 8  --     No results for input(s): PROT, ALBUMIN, AST, ALT, ALKPHOS, BILITOT in the last 168 hours. Lipids No results for input(s): CHOL, TRIG, HDL, LABVLDL, LDLCALC, CHOLHDL in the last 168 hours.  Hematology Recent Labs  Lab 10/14/21 1057  10/20/21 1943 10/21/21 0545  WBC 6.0 8.6 8.0  RBC 4.69 4.84 4.25  HGB 13.2 13.6 12.0*  HCT 39.3 39.9 34.9*  MCV 83.8 82.4 82.1  MCH 28.1 28.1 28.2  MCHC 33.6 34.1 34.4  RDW 14.5 14.6 14.6  PLT 176 195 159   Thyroid No results for input(s): TSH, FREET4 in the last 168 hours.  BNP Recent Labs  Lab 10/20/21 1945  BNP 300.6*    DDimer No results for input(s): DDIMER in the last 168 hours.   Radiology/Studies:  DG Chest 2 View  Result Date: 10/20/2021 CLINICAL DATA:  Chest pain and shortness of breath. EXAM: CHEST - 2 VIEW COMPARISON:  Oct 06, 2021. FINDINGS: The heart size and mediastinal contours are within normal limits. There is no evidence of acute infiltrate, pleural effusion or pneumothorax. The visualized skeletal structures are unremarkable. IMPRESSION: No active cardiopulmonary disease. Electronically Signed   By: Virgina Norfolk M.D.   On: 10/20/2021 20:19     Assessment and Plan:   Hypertensive Urgency - Start back on Entresto 24/26 mg bid, had been discussed at heart failure clinic - Continue coreg 6.25 mg bid - historically patients blood pressure has been difficult to control in the outpatient setting - no evidence of renal artery stenosis on CT angio abdomen and pelvis completed on 10/10/2021 - currently patient takes all medications at one time my need to  split medications up to offer better control as he keeps a routine log at home of his blood pressures  2.   Elevated HS troponin likely in the setting of demand ischemia form hypertensive urgency with blood pressure over 956 mmHg systolic - patient pain free - Trend troponins - discontinue heparin drip - restart Plavix 75 mg daily - EKG PRN for changes, no ischemic changes noted  3.   CAD - restart plavix 75 mg daily with discontinuation of heparin drip - continue asa 81 mg daily - continue Imdur 30 mg daily - EKG without changes - LHC 07/2021 CTO of the RCA with otherwise nonobstructive disease as  outlined above, planned for medical management, no plans for repeat cath at last admission 10/07/2021 - continue ASA, plavix, statin, and BB therapy  4.   HFrEF, mixed ICM.NICM, with pulmonary hypertension - Echo LVEF 35-40%, G1DD, mildly dilated RA, mild MR, and mild ascending aortic dilation measuring 40 mm - BNP 300.6 - appears relatively euvolemic - Continue to escalate GDMT as tolerated - of note spirolactone had to previously be discontinued due to hyperkalemia  - PTA lasix every other day - discontinue losartan and start entresto 24/26 mg bid  5. CKD stage 3 - creatinine baseline 1.30 - daily BMP  Risk Assessment/Risk Scores:     TIMI Risk Score for Unstable Angina or Non-ST Elevation MI:   The patient's TIMI risk score is  , which indicates a  % risk of all cause mortality, new or recurrent myocardial infarction or need for urgent revascularization in the next 14 days.  New York Heart Association (NYHA) Functional Class NYHA Class II        For questions or updates, please contact [facility] Please consult [email or web address] for contact info under    Signed, Lynzie Cliburn, NP  10/21/2021 7:42 AM

## 2021-10-21 NOTE — Assessment & Plan Note (Addendum)
Ischemic cardiomyopathy.  Patient on Entresto, Imdur Lasix Coreg

## 2021-10-21 NOTE — Assessment & Plan Note (Signed)
Continue statin. 

## 2021-10-21 NOTE — ED Notes (Signed)
Patient resting at this time VSS, heparin going at 6units/hr.

## 2021-10-21 NOTE — ED Notes (Signed)
Report received from [place], [place]

## 2021-10-21 NOTE — Progress Notes (Signed)
ANTICOAGULATION CONSULT NOTE - Initial Consult  Pharmacy Consult for Heparin  Indication: chest pain/ACS  Allergies  Allergen Reactions   Ibuprofen Other (See Comments) and Hives    Other reaction(s): GI Upset (intolerance) Hives    Neomycin-Bacitracin Zn-Polymyx     Other reaction(s): Other (See Comments)   Sulfacetamide Other (See Comments)    Patient Measurements: Height: 5' 10"  (177.8 cm) Weight: 97.5 kg (215 lb) IBW/kg (Calculated) : 73 Heparin Dosing Weight: 93.1 kg   Vital Signs: Temp: 98 F (36.7 C) (06/05 2213) Temp Source: Oral (06/05 2213) BP: 130/88 (06/06 0530) Pulse Rate: 62 (06/06 0530)  Labs: Recent Labs    10/20/21 1943 10/20/21 2215 10/20/21 2317 10/21/21 0545  HGB 13.6  --   --  12.0*  HCT 39.9  --   --  34.9*  PLT 195  --   --  159  APTT  --   --  32  --   LABPROT  --   --  12.8  --   INR  --   --  1.0  --   HEPARINUNFRC  --   --   --  0.56  CREATININE 1.30*  --   --   --   TROPONINIHS 45* 191*  --   --      Estimated Creatinine Clearance: 77.8 mL/min (A) (by C-G formula based on SCr of 1.3 mg/dL (H)).   Medical History: Past Medical History:  Diagnosis Date   CHF (congestive heart failure) ([place])    Chronic kidney disease    Crohn's disease ([place])    Hypertension    Proteinuria     Medications:  (Not in a hospital admission)  Assessment: Pharmacy consulted to dose heparin in this 51 year old male admitted with ACS/NSTEMI.  No prior anticoag noted.   CrCl = 77.8 ml/min  Goal of Therapy:  Heparin level 0.3-0.7 units/ml Monitor platelets by anticoagulation protocol: Yes   Plan:  6/6:  HL @ 3710 = 0.56, therapeutic X 1 Will continue pt on current rate and recheck HL on 6/6 @ 1200.   Danaria Larsen D 10/21/2021,6:24 AM

## 2021-10-21 NOTE — Progress Notes (Signed)
PROGRESS NOTE    Clarence [name]  RKY:[id number] DOB: Mar 24, 1971 DOA: 10/20/2021 PCP: Merryl Hacker, No  Outpatient Specialists: cardiology    Brief Narrative:   From admission h and p Clarence [name] is a 51 y.o. male with medical history significant for Severe hypertension, moderate severe multivessel CAD on cath 07/2021 not ideal for PCI or surgical revascularization, HFrEF with EF 35 to 40% and G1 DD on echo 5/23, significant pulmonary hypertension, suspect obstructive lung disease off beta-blockers, recently hospitalized from 5/23 to 5/27 for hypertensive urgency, acute CHF and demand ischemia who returns to the ED for evaluation of an episode of chest pain and shortness of breath that started 45 minutes prior to presentation.  His chest pain was relieved by arrival but states that since his discharge on 5/27 he has been getting episodes of chest pain and shortness of breath every 3 days.  He denies lower extremity pain or swelling.  During his recent past admission his SBP was in the 200s and he had a renal artery ultrasound that was negative for renal artery stenosis. ED course and data review: On arrival BP 221/148 with otherwise normal vitals, improved to 182/145 by admission.  Labs significant for troponin of 45>191 and BNP of 300.  Creatinine was at baseline at 1.3.  CBC, BMP, magnesium and PT/INR were otherwise unremarkable.  EKG, personally viewed and interpreted showed NSR at 96 with nonspecific ST T wave changes.  Chest x-ray showed no acute cardiopulmonary disease. Patient was treated with chewable aspirin, started on a heparin infusion.  Hospitalist consulted for admission.    Assessment & Plan:   Principal Problem:   Demand ischemia , possible NSTEMI Active Problems:   Hypertensive emergency   Essential hypertension   Chronic systolic CHF (congestive heart failure) (HCC)   Ischemic cardiomyopathy   Crohn's disease (HCC)   CKD (chronic kidney disease) stage 2, GFR 60-89 ml/min    Dyslipidemia   Pulmonary hypertension ([place])   # NSTEMI # CAD Known CAD seen on 3/23 cath, no intervention at that time, medical mgmt advised. Admitted last month for hypertensive urgency with demand ischemia. Here with substernal chest pain radiating to shoulder and neck with left arm numbness, BP again found to be severely elevated. Troponin 45>191. This may represent more demand ischemia. Received aspirin - cardiology consulted - continue heparin - trend troponin until peaks - cont home atorva, coreg, imdur, losartan - will hold home plavix pending cardiology evaluation - tele  # HFrEF EF 35-40 on TTE 09/2021, with grade 1 dd. Here appears compensated - home meds  # Hypertensive urgency/emergency Initial BPs severe range, now resolved to low normal with home meds and nitropaste. Patient says he takes his home meds regularly.  - home meds  # CKD stage 2 GFR is at baseline - monitor      DVT prophylaxis: therapeutic heparin Code Status: full Family Communication: none @ bedside  Level of care: Progressive Status is: Observation The patient will require care spanning > 2 midnights and should be moved to inpatient because: need for inpatient monitoring and evaluation    Consultants:  cardiology  Procedures: none  Antimicrobials:  none    Subjective: Chest pain resolved. No dyspnea. Has appetite  Objective: Vitals:   10/21/21 0330 10/21/21 0421 10/21/21 0530 10/21/21 0630  BP: 116/81  130/88 106/62  Pulse: (!) 50 62 62 (!) 53  Resp: 16 14 16 16   Temp:      TempSrc:  SpO2: 96% 98% 95% 95%  Weight:      Height:       No intake or output data in the 24 hours ending 10/21/21 0806 Filed Weights   10/20/21 1937 10/20/21 1942  Weight: 97.5 kg 97.5 kg    Examination:  General exam: Appears calm and comfortable  Respiratory system: Clear to auscultation. Respiratory effort normal. Cardiovascular system: S1 & S2 heard, RRR. No JVD, murmurs, rubs,  gallops or clicks. No pedal edema. Gastrointestinal system: Abdomen is nondistended, soft and nontender. No organomegaly or masses felt. Normal bowel sounds heard. Central nervous system: Alert and oriented. No focal neurological deficits. Extremities: Symmetric 5 x 5 power. Skin: No rashes, lesions or ulcers Psychiatry: Judgement and insight appear normal. Mood & affect appropriate.     Data Reviewed: I have personally reviewed following labs and imaging studies  CBC: Recent Labs  Lab 10/14/21 1057 10/20/21 1943 10/21/21 0545  WBC 6.0 8.6 8.0  NEUTROABS 3.8  --   --   HGB 13.2 13.6 12.0*  HCT 39.3 39.9 34.9*  MCV 83.8 82.4 82.1  PLT 176 195 956   Basic Metabolic Panel: Recent Labs  Lab 10/14/21 1057 10/20/21 1943 10/20/21 2317  NA 143 138  --   K 4.8 3.7  --   CL 108 106  --   CO2 25 24  --   GLUCOSE 104* 135*  --   BUN 31* 21*  --   CREATININE 1.45* 1.30*  --   CALCIUM 9.9 9.4  --   MG  --   --  2.0   GFR: Estimated Creatinine Clearance: 77.8 mL/min (A) (by C-G formula based on SCr of 1.3 mg/dL (H)). Liver Function Tests: No results for input(s): AST, ALT, ALKPHOS, BILITOT, PROT, ALBUMIN in the last 168 hours. No results for input(s): LIPASE, AMYLASE in the last 168 hours. No results for input(s): AMMONIA in the last 168 hours. Coagulation Profile: Recent Labs  Lab 10/20/21 2317  INR 1.0   Cardiac Enzymes: No results for input(s): CKTOTAL, CKMB, CKMBINDEX, TROPONINI in the last 168 hours. BNP (last 3 results) No results for input(s): PROBNP in the last 8760 hours. HbA1C: No results for input(s): HGBA1C in the last 72 hours. CBG: No results for input(s): GLUCAP in the last 168 hours. Lipid Profile: No results for input(s): CHOL, HDL, LDLCALC, TRIG, CHOLHDL, LDLDIRECT in the last 72 hours. Thyroid Function Tests: No results for input(s): TSH, T4TOTAL, FREET4, T3FREE, THYROIDAB in the last 72 hours. Anemia Panel: No results for input(s): VITAMINB12,  FOLATE, FERRITIN, TIBC, IRON, RETICCTPCT in the last 72 hours. Urine analysis:    Component Value Date/Time   COLORURINE YELLOW 09/14/2021 2233   APPEARANCEUR CLEAR 09/14/2021 2233   APPEARANCEUR Clear 10/24/2012 1000   LABSPEC 1.009 09/14/2021 2233   LABSPEC 1.015 10/24/2012 1000   PHURINE 5.0 09/14/2021 2233   GLUCOSEU 50 (A) 09/14/2021 2233   GLUCOSEU Negative 10/24/2012 1000   HGBUR NEGATIVE 09/14/2021 2233   BILIRUBINUR NEGATIVE 09/14/2021 2233   BILIRUBINUR Negative 10/24/2012 1000   KETONESUR NEGATIVE 09/14/2021 2233   PROTEINUR NEGATIVE 09/14/2021 2233   NITRITE NEGATIVE 09/14/2021 2233   LEUKOCYTESUR NEGATIVE 09/14/2021 2233   LEUKOCYTESUR Negative 10/24/2012 1000   Sepsis Labs: @LABRCNTIP (procalcitonin:4,lacticidven:4)  )No results found for this or any previous visit (from the past 240 hour(s)).       Radiology Studies: DG Chest 2 View  Result Date: 10/20/2021 CLINICAL DATA:  Chest pain and shortness of breath. EXAM: CHEST -  2 VIEW COMPARISON:  Oct 06, 2021. FINDINGS: The heart size and mediastinal contours are within normal limits. There is no evidence of acute infiltrate, pleural effusion or pneumothorax. The visualized skeletal structures are unremarkable. IMPRESSION: No active cardiopulmonary disease. Electronically Signed   By: Virgina Norfolk M.D.   On: 10/20/2021 20:19        Scheduled Meds:  atorvastatin  80 mg Oral QPM   carvedilol  6.25 mg Oral BID WC   clopidogrel  75 mg Oral Daily   dapagliflozin propanediol  10 mg Oral Daily   furosemide  20 mg Oral QODAY   isosorbide mononitrate  30 mg Oral Daily   losartan  50 mg Oral Daily   Continuous Infusions:  heparin 1,300 Units/hr (10/20/21 2340)     LOS: 0 days     Desma Maxim, MD [facility]   If 7PM-7AM, please contact night-coverage [email or web address] Password [email or web address] 10/21/2021, 8:06 AM

## 2021-10-21 NOTE — Assessment & Plan Note (Signed)
Continue home Lasix, carvedilol, losartan We will add Nitropaste for additional control

## 2021-10-21 NOTE — Assessment & Plan Note (Addendum)
As reviewed on cardiac cath report from March 23

## 2021-10-21 NOTE — H&P (Signed)
History and Physical    Patient: Clarence [name] NUU:[id number] DOB: 1971-03-02 DOA: 10/20/2021 DOS: the patient was seen and examined on 10/21/2021 PCP: Pcp, No  Patient coming from: Home  Chief Complaint:  Chief Complaint  Patient presents with   Chest Pain   Shortness of Breath    HPI: Clarence [name] is a 51 y.o. male with medical history significant for Severe hypertension, moderate severe multivessel CAD on cath 07/2021 not ideal for PCI or surgical revascularization, HFrEF with EF 35 to 40% and G1 DD on echo 5/23, significant pulmonary hypertension, suspect obstructive lung disease off beta-blockers, recently hospitalized from 5/23 to 5/27 for hypertensive urgency, acute CHF and demand ischemia who returns to the ED for evaluation of an episode of chest pain and shortness of breath that started 45 minutes prior to presentation.  His chest pain was relieved by arrival but states that since his discharge on 5/27 he has been getting episodes of chest pain and shortness of breath every 3 days.  He denies lower extremity pain or swelling.  During his recent past admission his SBP was in the 200s and he had a renal artery ultrasound that was negative for renal artery stenosis. ED course and data review: On arrival BP 221/148 with otherwise normal vitals, improved to 182/145 by admission.  Labs significant for troponin of 45>191 and BNP of 300.  Creatinine was at baseline at 1.3.  CBC, BMP, magnesium and PT/INR were otherwise unremarkable.  EKG, personally viewed and interpreted showed NSR at 96 with nonspecific ST T wave changes.  Chest x-ray showed no acute cardiopulmonary disease. Patient was treated with chewable aspirin, started on a heparin infusion.  Hospitalist consulted for admission.   Review of Systems: As mentioned in the history of present illness. All other systems reviewed and are negative.  Past Medical History:  Diagnosis Date   CHF (congestive heart failure) (HCC)    Chronic  kidney disease    Crohn's disease ([place])    Hypertension    Proteinuria    Past Surgical History:  Procedure Laterality Date   COLECTOMY     RIGHT/LEFT HEART CATH AND CORONARY ANGIOGRAPHY N/A 08/07/2021   Procedure: RIGHT/LEFT HEART CATH AND CORONARY ANGIOGRAPHY;  Surgeon: Nelva Bush, MD;  Location: [facility];  Service: Cardiovascular;  Laterality: N/A;   Social History:  reports that he quit smoking about 2 months ago. His smoking use included cigars. He does not have any smokeless tobacco history on file. He reports that he does not currently use alcohol after a past usage of about 1.0 standard drink per week. He reports that he does not use drugs.  Allergies  Allergen Reactions   Ibuprofen Other (See Comments) and Hives    Other reaction(s): GI Upset (intolerance) Hives    Neomycin-Bacitracin Zn-Polymyx     Other reaction(s): Other (See Comments)   Sulfacetamide Other (See Comments)    Family History  Problem Relation Age of Onset   Heart disease Maternal Uncle    Pancreatic cancer Maternal Uncle    Cancer Maternal Aunt     Prior to Admission medications   Medication Sig Start Date End Date Taking? Authorizing Provider  atorvastatin (LIPITOR) 80 MG tablet TAKE 1 TABLET BY MOUTH EVERY EVENING 10/20/21  Yes Dunn, Areta Haber, PA-C  carvedilol (COREG) 6.25 MG tablet Take 1 tablet (6.25 mg total) by mouth 2 (two) times daily with a meal. 10/11/21 11/10/21 Yes Amery, Gwynneth Albright, MD  clopidogrel (PLAVIX) 75 MG tablet  Take 1 tablet (75 mg total) by mouth daily. 10/14/21  Yes Dunn, Areta Haber, PA-C  Famotidine (PEPCID AC PO) Take by mouth as needed.   Yes [provider]  FARXIGA 10 MG TABS tablet TAKE 1 TABLET BY MOUTH EVERY DAY 10/20/21  Yes Dunn, Areta Haber, PA-C  furosemide (LASIX) 20 MG tablet Take 1 tablet (20 mg total) by mouth every other day. 10/13/21  Yes Darylene Price A, [name]  isosorbide mononitrate (IMDUR) 30 MG 24 hr tablet Take 1 tablet (30 mg total) by mouth daily.  10/12/21 11/11/21 Yes Nolberto Hanlon, MD  losartan (COZAAR) 25 MG tablet Take 1 tablet (25 mg total) by mouth daily. Patient taking differently: Take 50 mg by mouth daily. 10/01/21 12/30/21 Yes Dunn, Areta Haber, PA-C  ondansetron (ZOFRAN-ODT) 4 MG disintegrating tablet Take 4 mg by mouth every 8 (eight) hours as needed for nausea or vomiting.   Yes [provider]    Physical Exam: Vitals:   10/20/21 1942 10/20/21 2213 10/20/21 2300 10/20/21 2330  BP:  (!) 184/123 (!) 194/110 (!) 182/145  Pulse:  66 96 (!) 55  Resp:  18 19 (!) 21  Temp:  98 F (36.7 C)    TempSrc:  Oral    SpO2:  98% 94% 100%  Weight: 97.5 kg     Height: 5' 10"  (1.778 m)      Physical Exam Vitals and nursing note reviewed.  Constitutional:      General: He is not in acute distress. HENT:     Head: Normocephalic and atraumatic.  Cardiovascular:     Rate and Rhythm: Normal rate and regular rhythm.     Heart sounds: Normal heart sounds.  Pulmonary:     Effort: Pulmonary effort is normal.     Breath sounds: Normal breath sounds.  Abdominal:     Palpations: Abdomen is soft.     Tenderness: There is no abdominal tenderness.  Neurological:     Mental Status: Mental status is at baseline.    Labs on Admission: I have personally reviewed following labs and imaging studies  CBC: Recent Labs  Lab 10/14/21 1057 10/20/21 1943  WBC 6.0 8.6  NEUTROABS 3.8  --   HGB 13.2 13.6  HCT 39.3 39.9  MCV 83.8 82.4  PLT 176 675   Basic Metabolic Panel: Recent Labs  Lab 10/14/21 1057 10/20/21 1943 10/20/21 2317  NA 143 138  --   K 4.8 3.7  --   CL 108 106  --   CO2 25 24  --   GLUCOSE 104* 135*  --   BUN 31* 21*  --   CREATININE 1.45* 1.30*  --   CALCIUM 9.9 9.4  --   MG  --   --  2.0   GFR: Estimated Creatinine Clearance: 77.8 mL/min (A) (by C-G formula based on SCr of 1.3 mg/dL (H)). Liver Function Tests: No results for input(s): AST, ALT, ALKPHOS, BILITOT, PROT, ALBUMIN in the last 168 hours. No results  for input(s): LIPASE, AMYLASE in the last 168 hours. No results for input(s): AMMONIA in the last 168 hours. Coagulation Profile: Recent Labs  Lab 10/20/21 2317  INR 1.0   Cardiac Enzymes: No results for input(s): CKTOTAL, CKMB, CKMBINDEX, TROPONINI in the last 168 hours. BNP (last 3 results) No results for input(s): PROBNP in the last 8760 hours. HbA1C: No results for input(s): HGBA1C in the last 72 hours. CBG: No results for input(s): GLUCAP in the last 168 hours. Lipid Profile:  No results for input(s): CHOL, HDL, LDLCALC, TRIG, CHOLHDL, LDLDIRECT in the last 72 hours. Thyroid Function Tests: No results for input(s): TSH, T4TOTAL, FREET4, T3FREE, THYROIDAB in the last 72 hours. Anemia Panel: No results for input(s): VITAMINB12, FOLATE, FERRITIN, TIBC, IRON, RETICCTPCT in the last 72 hours. Urine analysis:    Component Value Date/Time   COLORURINE YELLOW 09/14/2021 2233   APPEARANCEUR CLEAR 09/14/2021 2233   APPEARANCEUR Clear 10/24/2012 1000   LABSPEC 1.009 09/14/2021 2233   LABSPEC 1.015 10/24/2012 1000   PHURINE 5.0 09/14/2021 2233   GLUCOSEU 50 (A) 09/14/2021 2233   GLUCOSEU Negative 10/24/2012 1000   HGBUR NEGATIVE 09/14/2021 2233   BILIRUBINUR NEGATIVE 09/14/2021 2233   BILIRUBINUR Negative 10/24/2012 1000   KETONESUR NEGATIVE 09/14/2021 2233   PROTEINUR NEGATIVE 09/14/2021 2233   NITRITE NEGATIVE 09/14/2021 2233   LEUKOCYTESUR NEGATIVE 09/14/2021 2233   LEUKOCYTESUR Negative 10/24/2012 1000    Radiological Exams on Admission: DG Chest 2 View  Result Date: 10/20/2021 CLINICAL DATA:  Chest pain and shortness of breath. EXAM: CHEST - 2 VIEW COMPARISON:  Oct 06, 2021. FINDINGS: The heart size and mediastinal contours are within normal limits. There is no evidence of acute infiltrate, pleural effusion or pneumothorax. The visualized skeletal structures are unremarkable. IMPRESSION: No active cardiopulmonary disease. Electronically Signed   By: Virgina Norfolk M.D.    On: 10/20/2021 20:19     Data Reviewed: Relevant notes from primary care and specialist visits, past discharge summaries as available in EHR, including Care Everywhere. Prior diagnostic testing as pertinent to current admission diagnoses Updated medications and problem lists for reconciliation ED course, including vitals, labs, imaging, treatment and response to treatment Triage notes, nursing and pharmacy notes and ED provider's notes Notable results as noted in HPI   Assessment and Plan: * Demand ischemia , possible NSTEMI Multivessel coronary artery disease Cath 07/2021: Diffuse moderate-severe, multivessel coronary artery disease, including chronic total occlusion of mid RCA, diffuse plaquing of LAD with up to 60% stenosis in the mid vessel, 60-70% proximal/mid LCx stenosis, and 90% lesion of OM1. Continue heparin infusion started in the emergency room Continue Lipitor, Cozaar, [place]  Nitropaste Cardiology consult    Hypertensive emergency Continue home Lasix, carvedilol, losartan We will add Nitropaste for additional control  Chronic systolic CHF (congestive heart failure) (HCC) Ischemic cardiomyopathy Symptomatic for shortness of breath and with elevated BNP of 300 but chest x-ray is nonacute and does not appear fluid overloaded Continue furosemide, carvedilol and losartan  CKD (chronic kidney disease) stage 2, GFR 60-89 ml/min Renal function at baseline  Pulmonary hypertension ([place]) As reviewed on cardiac cath report from March 23  Dyslipidemia Continue statin   DVT prophylaxis: On heparin infusion  Consults: [facility] cardiology, Dr. Aundra Dubin  Advance Care Planning:   Code Status: Prior   Family Communication: none  Disposition Plan: Back to previous home environment  Severity of Illness: The appropriate patient status for this patient is INPATIENT. Inpatient status is judged to be reasonable and necessary in order to provide the required intensity of service to  ensure the patient's safety. The patient's presenting symptoms, physical exam findings, and initial radiographic and laboratory data in the context of their chronic comorbidities is felt to place them at high risk for further clinical deterioration. Furthermore, it is not anticipated that the patient will be medically stable for discharge from the hospital within 2 midnights of admission.   * I certify that at the point of admission it is my clinical judgment that the  patient will require inpatient hospital care spanning beyond 2 midnights from the point of admission due to high intensity of service, high risk for further deterioration and high frequency of surveillance required.*  Author: Athena Masse, MD 10/21/2021 12:18 AM  For on call review [email or web address].

## 2021-10-22 DIAGNOSIS — I161 Hypertensive emergency: Secondary | ICD-10-CM | POA: Diagnosis not present

## 2021-10-22 DIAGNOSIS — I5022 Chronic systolic (congestive) heart failure: Secondary | ICD-10-CM

## 2021-10-22 DIAGNOSIS — I214 Non-ST elevation (NSTEMI) myocardial infarction: Secondary | ICD-10-CM | POA: Diagnosis not present

## 2021-10-22 DIAGNOSIS — I248 Other forms of acute ischemic heart disease: Secondary | ICD-10-CM | POA: Diagnosis not present

## 2021-10-22 DIAGNOSIS — N182 Chronic kidney disease, stage 2 (mild): Secondary | ICD-10-CM | POA: Diagnosis not present

## 2021-10-22 LAB — BASIC METABOLIC PANEL
Anion gap: 5 (ref 5–15)
BUN: 18 mg/dL (ref 6–20)
CO2: 28 mmol/L (ref 22–32)
Calcium: 9.1 mg/dL (ref 8.9–10.3)
Chloride: 106 mmol/L (ref 98–111)
Creatinine, Ser: 1.19 mg/dL (ref 0.61–1.24)
GFR, Estimated: >60 mL/min (ref >60)
Glucose, Bld: 105 mg/dL — ABNORMAL HIGH (ref 70–99)
Potassium: 4.1 mmol/L (ref 3.5–5.1)
Sodium: 139 mmol/L (ref 135–145)

## 2021-10-22 LAB — HEPARIN LEVEL (UNFRACTIONATED): Heparin Unfractionated: 0.42 IU/mL (ref 0.30–0.70)

## 2021-10-22 NOTE — ED Notes (Signed)
Inpatient RN Harley Hallmark will send message when room is done being treated, inpatient bed currently not ready for pt at this time

## 2021-10-22 NOTE — Progress Notes (Signed)
ANTICOAGULATION CONSULT NOTE - Initial Consult  Pharmacy Consult for Heparin  Indication: chest pain/ACS  Allergies  Allergen Reactions   Ibuprofen Other (See Comments) and Hives    Other reaction(s): GI Upset (intolerance) Hives    Neomycin-Bacitracin Zn-Polymyx     Other reaction(s): Other (See Comments)   Sulfacetamide Other (See Comments)    Patient Measurements: Height: 5' 10"  (177.8 cm) Weight: 97.5 kg (215 lb) IBW/kg (Calculated) : 73 Heparin Dosing Weight: 93.1 kg   Vital Signs: Temp: 98.2 F (36.8 C) (06/07 0222) Temp Source: Oral (06/07 0222) BP: 175/117 (06/07 0222) Pulse Rate: 65 (06/07 0222)  Labs: Recent Labs    10/20/21 1943 10/20/21 2215 10/20/21 2317 10/21/21 0545 10/21/21 0850 10/21/21 1140 10/21/21 1545 10/22/21 0321  HGB 13.6  --   --  12.0*  --   --   --   --   HCT 39.9  --   --  34.9*  --   --   --   --   PLT 195  --   --  159  --   --   --   --   APTT  --   --  32  --   --   --   --   --   LABPROT  --   --  12.8  --   --   --   --   --   INR  --   --  1.0  --   --   --   --   --   HEPARINUNFRC  --   --   --  0.56  --  0.40  --  0.42  CREATININE 1.30*  --   --   --  1.05  --   --  1.19  TROPONINIHS 45* 191*  --   --  587*  --  330*  --      Estimated Creatinine Clearance: 85 mL/min (by C-G formula based on SCr of 1.19 mg/dL).   Medical History: Past Medical History:  Diagnosis Date   CHF (congestive heart failure) ([place])    Chronic kidney disease    Crohn's disease ([place])    Hypertension    Proteinuria     Medications:  Medications Prior to Admission  Medication Sig Dispense Refill Last Dose   atorvastatin (LIPITOR) 80 MG tablet TAKE 1 TABLET BY MOUTH EVERY EVENING 30 tablet 0 10/20/2021 at 1800   carvedilol (COREG) 6.25 MG tablet Take 1 tablet (6.25 mg total) by mouth 2 (two) times daily with a meal. 60 tablet 0 10/20/2021 at 1800   clopidogrel (PLAVIX) 75 MG tablet Take 1 tablet (75 mg total) by mouth daily. 90 tablet 0 10/20/2021  at 0800   Famotidine (PEPCID AC PO) Take by mouth as needed.   prn at prn   FARXIGA 10 MG TABS tablet TAKE 1 TABLET BY MOUTH EVERY DAY 30 tablet 0 10/20/2021 at 0800   furosemide (LASIX) 20 MG tablet Take 1 tablet (20 mg total) by mouth every other day. 15 tablet 5 10/19/2021 at 0800   isosorbide mononitrate (IMDUR) 30 MG 24 hr tablet Take 1 tablet (30 mg total) by mouth daily. 30 tablet 0 10/20/2021 at 0800   losartan (COZAAR) 25 MG tablet Take 1 tablet (25 mg total) by mouth daily. (Patient taking differently: Take 50 mg by mouth daily.) 90 tablet 3 10/20/2021 at 0800   ondansetron (ZOFRAN-ODT) 4 MG disintegrating tablet Take 4 mg by mouth every 8 (eight)  hours as needed for nausea or vomiting.   prn at prn    Assessment: Pharmacy consulted to dose heparin in this 51 year old male admitted with ACS/NSTEMI.  No prior anticoag noted.   CrCl = 77.8 ml/min  Goal of Therapy:  Heparin level 0.3-0.7 units/ml Monitor platelets by anticoagulation protocol: Yes   Plan:  6/7:  HL @ 0321 = 0.42, therapeutic X 3  Will continue pt on current rate and recheck HL on 6/8 with AM labs.   Shontez Sermon D 10/22/2021,4:49 AM

## 2021-10-22 NOTE — Progress Notes (Signed)
  Progress Note   Patient: Clarence [name] CHE:[id number] DOB: 1970-07-19 DOA: 10/20/2021     1 DOS: the patient was seen and examined on 10/22/2021     Assessment and Plan: * Demand ischemia , possible NSTEMI Multivessel coronary artery disease Cath 07/2021: Diffuse moderate-severe, multivessel coronary artery disease, including chronic total occlusion of mid RCA, diffuse plaquing of LAD with up to 60% stenosis in the mid vessel, 60-70% proximal/mid LCx stenosis, and 90% lesion of OM1. Continue heparin for 48 hours Continue Lipitor, Entresto, Coreg       Hypertensive emergency Continue home Lasix, carvedilol, and Entresto.  Blood pressure better than admission.  Chronic HFrEF (heart failure with reduced ejection fraction) (HCC) Ischemic cardiomyopathy.  Patient on Entresto, Imdur Lasix Coreg   CKD (chronic kidney disease) stage 2, GFR 60-89 ml/min Renal function at baseline.  Monitor closely with diuresis.  Pulmonary hypertension ([place]) As reviewed on cardiac cath report from March 23  Dyslipidemia Continue statin        Subjective: Patient feeling better than when he came in.  Came in with shortness of breath and chest pain.  Feels better after starting Entresto.  Admitted with NSTEMI.  Physical Exam: Vitals:   10/22/21 0529 10/22/21 0741 10/22/21 1133 10/22/21 1629  BP: 121/79 137/86 (!) 134/94 (!) 151/92  Pulse: 65 65 71 69  Resp: 18 19 19 19   Temp: 98.2 F (36.8 C) 98.4 F (36.9 C) 98.1 F (36.7 C) 98.1 F (36.7 C)  TempSrc: Oral Oral  Oral  SpO2: 96% 96% 97% 97%  Weight:      Height:       Physical Exam HENT:     Head: Normocephalic.     Mouth/Throat:     Pharynx: No oropharyngeal exudate.  Eyes:     General: Lids are normal.     Conjunctiva/sclera: Conjunctivae normal.  Cardiovascular:     Rate and Rhythm: Normal rate and regular rhythm.     Heart sounds: Normal heart sounds, S1 normal and S2 normal.  Pulmonary:     Breath sounds: Normal breath  sounds. No decreased breath sounds, wheezing, rhonchi or rales.  Abdominal:     Palpations: Abdomen is soft.     Tenderness: There is no abdominal tenderness.  Musculoskeletal:     Right lower leg: No swelling.     Left lower leg: No swelling.  Skin:    General: Skin is warm.     Findings: No rash.  Neurological:     Mental Status: He is alert and oriented to person, place, and time.    Data Reviewed: Troponin up to 587  Family Communication: Declined  Disposition: Status is: Inpatient Remains inpatient appropriate because: Being treated for NSTEMI  Planned Discharge Destination: Home   Author: Loletha Grayer, MD 10/22/2021 4:39 PM  For on call review [email or web address].

## 2021-10-22 NOTE — ED Notes (Signed)
Secure message sent to receiving RN Harley Hallmark for inpatient admission

## 2021-10-22 NOTE — ED Notes (Addendum)
Pt appears to be resting while watching TV, even RR and unlabored, NAD noted, call bell in reach, side rails up x2 for safety, care on going, will continue to monitor.

## 2021-10-22 NOTE — Progress Notes (Signed)
   10/22/21 2100  Assess: MEWS Score  Temp 98.5 F (36.9 C)  BP (!) 147/84  ECG Heart Rate 95  Resp (!) 21  Level of Consciousness Alert  Assess: if the MEWS score is Yellow or Red  Were vital signs taken at a resting state? Yes  Focused Assessment Change from prior assessment (see assessment flowsheet)  Does the patient meet 2 or more of the SIRS criteria? No  MEWS guidelines implemented *See Row Information* Yes  Notify: Charge Nurse/RN  Name of Charge Nurse/RN Notified Felicia,RN  Date Charge Nurse/RN Notified 10/22/21  Time Charge Nurse/RN Notified 2108  Assess: SIRS CRITERIA  SIRS Temperature  0  SIRS Pulse 1  SIRS Respirations  1  SIRS WBC 0  SIRS Score Sum  2   Scheduled Blood pressure med administered.  Will recheck vitals in 1 hours.

## 2021-10-22 NOTE — Progress Notes (Signed)
  Transition of Care [facility]) Screening Note   Patient Details  Name: Clarence [name] Date of Birth: 28-Mar-1971   Transition of Care [facility]) CM/SW Contact:    Alberteen Sam, LCSW Phone Number: 10/22/2021, 11:19 AM    Transition of Care Department [facility]) has reviewed patient and no TOC needs have been identified at this time. We will continue to monitor patient advancement through interdisciplinary progression rounds. If new patient transition needs arise, please place a TOC consult.  [place], [place]

## 2021-10-22 NOTE — Progress Notes (Signed)
Cardiology Progress Note   Patient Name: Clarence [name] Date of Encounter: 10/22/2021  Primary Cardiologist: Nelva Bush, MD  Subjective   Patient seen on AM rounds. Denies shortness of breath, endorses some palpitations throughout the night and mild chest discomfort he believes is indigestion. Blood pressure has been better controlled.   Inpatient Medications    Scheduled Meds:  aspirin  81 mg Oral Daily   atorvastatin  80 mg Oral QPM   carvedilol  6.25 mg Oral BID WC   clopidogrel  75 mg Oral Daily   dapagliflozin propanediol  10 mg Oral Daily   furosemide  20 mg Oral QODAY   isosorbide mononitrate  30 mg Oral Daily   pantoprazole  40 mg Oral Daily   sacubitril-valsartan  1 tablet Oral BID   Continuous Infusions:  heparin 1,300 Units/hr (10/22/21 0332)   PRN Meds: acetaminophen, nitroGLYCERIN, ondansetron (ZOFRAN) IV   Vital Signs    Vitals:   10/22/21 0200 10/22/21 0222 10/22/21 0529 10/22/21 0741  BP: (!) 137/101 (!) 175/117 121/79 137/86  Pulse: 75 65 65 65  Resp: 19 18 18 19   Temp: 98.1 F (36.7 C) 98.2 F (36.8 C) 98.2 F (36.8 C) 98.4 F (36.9 C)  TempSrc: Oral Oral Oral Oral  SpO2: 98% 99% 96% 96%  Weight:      Height:        Intake/Output Summary (Last 24 hours) at 10/22/2021 0749 Last data filed at 10/22/2021 0332 Gross per 24 hour  Intake 391.16 ml  Output --  Net 391.16 ml   Filed Weights   10/20/21 1937 10/20/21 1942  Weight: 97.5 kg 97.5 kg    Physical Exam   GEN: Well nourished, well developed, in no acute distress.  HEENT: Grossly normal.  Neck: Supple, no JVD, carotid bruits, or masses. Cardiac: RRR, no murmurs, rubs, or gallops. No clubbing, cyanosis, edema.  Radials 2+, DP/PT 2+ and equal bilaterally.  Respiratory:  Respirations regular and unlabored, clear to auscultation bilaterally on room air. GI: Soft, nontender, nondistended, BS + x 4. MS: no deformity or atrophy. Skin: warm and dry, no rash. Neuro:  Strength and  sensation are intact. Psych: AAOx3.  Normal affect.  Labs    Chemistry Recent Labs  Lab 10/20/21 1943 10/21/21 0850 10/22/21 0321  NA 138 138 139  K 3.7 3.6 4.1  CL 106 107 106  CO2 24 24 28   GLUCOSE 135* 133* 105*  BUN 21* 20 18  CREATININE 1.30* 1.05 1.19  CALCIUM 9.4 9.2 9.1  GFRNONAA >60 >60 >60  ANIONGAP 8 7 5      Hematology Recent Labs  Lab 10/20/21 1943 10/21/21 0545  WBC 8.6 8.0  RBC 4.84 4.25  HGB 13.6 12.0*  HCT 39.9 34.9*  MCV 82.4 82.1  MCH 28.1 28.2  MCHC 34.1 34.4  RDW 14.6 14.6  PLT 195 159    Cardiac Enzymes  Recent Labs  Lab 10/07/21 0659 10/20/21 1943 10/20/21 2215 10/21/21 0850 10/21/21 1545  TROPONINIHS 221* 45* 191* 587* 330*      BNP    Component Value Date/Time   BNP 300.6 (H) 10/20/2021 1945    ProBNP No results found for: PROBNP   DDimer No results for input(s): DDIMER in the last 168 hours.   Lipids  Lab Results  Component Value Date   CHOL 149 10/07/2021   HDL 30 (L) 10/07/2021   LDLCALC 43 10/07/2021   LDLDIRECT 69.0 09/09/2021   TRIG 382 (H) 10/07/2021  CHOLHDL 5.0 10/07/2021    HbA1c  Lab Results  Component Value Date   HGBA1C 5.4 08/05/2021    Radiology    DG Chest 2 View  Result Date: 10/20/2021 CLINICAL DATA:  Chest pain and shortness of breath. EXAM: CHEST - 2 VIEW COMPARISON:  Oct 06, 2021. FINDINGS: The heart size and mediastinal contours are within normal limits. There is no evidence of acute infiltrate, pleural effusion or pneumothorax. The visualized skeletal structures are unremarkable. IMPRESSION: No active cardiopulmonary disease. Electronically Signed   By: Virgina Norfolk M.D.   On: 10/20/2021 20:19    Telemetry    SR rates of 60-90 with some artifact- Personally Reviewed  ECG    No new tracings - Personally Reviewed  Cardiac Studies  Echocardiogram completed on 10/07/2021 1. Left ventricular ejection fraction, by estimation, is 35 to 40%. The  left ventricle has moderately  decreased function. The left ventricle  demonstrates global hypokinesis. There is moderate left ventricular  hypertrophy. Left ventricular diastolic  parameters are consistent with Grade I diastolic dysfunction (impaired  relaxation).   2. Right ventricular systolic function is normal. The right ventricular  size is normal.   3. Right atrial size was mildly dilated.   4. The mitral valve is normal in structure. Mild mitral valve  regurgitation. No evidence of mitral stenosis.   5. The aortic valve is tricuspid. Aortic valve regurgitation is not  visualized. No aortic stenosis is present.   6. There is mild dilatation of the ascending aorta, measuring 40 mm.   Right and Left Heart Cath completed on 08/07/2021 Conclusions: Diffuse moderate-severe, multivessel coronary artery disease, including chronic total occlusion of mid RCA, diffuse plaquing of LAD with up to 60% stenosis in the mid vessel, 60-70% proximal/mid LCx stenosis, and 90% lesion of OM1. Moderately elevated left heart filling pressures (LVEDP/PCWP 25 mmHg). Severely elevated right heart filling pressures (mean RA 16 mmHg, RVEDP 30 mmHg). Moderate-severe pulmonary hypertension (mean PAP 42 mmHg, PVR 4.0 WU). Moderately reduced Fick cardiac output/index (CO 4.2 L/min, CI 2.0 L/min/m^2).Marland Kitchen  Patient Profile     51 y.o. 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  Assessment & Plan    1.  Hypertensive urgency -Continue Entresto 24/26 mg twice daily -Continue carvedilol 6.25 mg twice daily -No evidence of renal artery stenosis on CT angio of the abdomen and pelvis completed on 10/10/2021 -Blood pressures are improving medication adjustments  2.  Elevated  high-sensitivity troponin likely in the setting of demand ischemia from hypertensive urgency with blood pressure over 626 systolic -Continue heparin drip for 48 hours, can be discontinued 10/23/2021 AM, with his history of elevated high-sensitivity troponins with multivessel CAD -Patient's only complaint this morning is of indigestion -EKG as needed for changes -Cath films have been reviewed from 07/2021 and again there was no favorable PCI targets, only consideration would be subtotally occluded OM branch that was relatively small, continue with escalation of medical therapy  3.  Multivessel CAD -Continue Plavix 75 mg daily -Continue aspirin 81 mg daily -Continue Imdur 30 mg daily -Telemetry without ischemic changes noted -Left heart catheterization on 07/2021 revealed complete total occlusion of the RCA with otherwise nonobstructive disease continued with medical management  4.HFrEF, mixed ICM, NICM, with pulmonary hypertension -Echocardiogram revealed LVEF  35 to 70%, grade 1 diastolic dysfunction, mildly dilated right atrium, mild mitral regurgitation, and mild ascending aortic dilatation measuring 40 mm -BNP 300.6 -Appears euvolemic -Continue Entresto 24/26 mg twice daily and coreg 6.25 mg bid -PTA Lasix every other day -Avoid spironolactone as previously had to be discontinued due to hyperkalemia -Continue to escalate GDMT as tolerated -With continued multiple admissions may benefit from outpatient consultation with the advanced heart failure group in [place]  5. CKD stage 3 -Creatinine baseline of 1.30 -Creatinine 1.19 today -Daily BMP  Signed, Elis Rawlinson, NP  10/22/2021, 7:49 AM    For questions or updates, please contact   Please consult [email or web address] for contact info under Cardiology/STEMI.

## 2021-10-23 DIAGNOSIS — I214 Non-ST elevation (NSTEMI) myocardial infarction: Principal | ICD-10-CM

## 2021-10-23 DIAGNOSIS — I248 Other forms of acute ischemic heart disease: Secondary | ICD-10-CM | POA: Diagnosis not present

## 2021-10-23 DIAGNOSIS — I5022 Chronic systolic (congestive) heart failure: Secondary | ICD-10-CM | POA: Diagnosis not present

## 2021-10-23 DIAGNOSIS — N182 Chronic kidney disease, stage 2 (mild): Secondary | ICD-10-CM | POA: Diagnosis not present

## 2021-10-23 DIAGNOSIS — I255 Ischemic cardiomyopathy: Secondary | ICD-10-CM

## 2021-10-23 DIAGNOSIS — I10 Essential (primary) hypertension: Secondary | ICD-10-CM | POA: Diagnosis not present

## 2021-10-23 DIAGNOSIS — E785 Hyperlipidemia, unspecified: Secondary | ICD-10-CM | POA: Diagnosis not present

## 2021-10-23 DIAGNOSIS — I161 Hypertensive emergency: Secondary | ICD-10-CM | POA: Diagnosis not present

## 2021-10-23 DIAGNOSIS — I272 Pulmonary hypertension, unspecified: Secondary | ICD-10-CM

## 2021-10-23 LAB — BASIC METABOLIC PANEL
Anion gap: 6 (ref 5–15)
BUN: 20 mg/dL (ref 6–20)
CO2: 26 mmol/L (ref 22–32)
Calcium: 9.2 mg/dL (ref 8.9–10.3)
Chloride: 108 mmol/L (ref 98–111)
Creatinine, Ser: 1.16 mg/dL (ref 0.61–1.24)
GFR, Estimated: >60 mL/min (ref >60)
Glucose, Bld: 106 mg/dL — ABNORMAL HIGH (ref 70–99)
Potassium: 4.2 mmol/L (ref 3.5–5.1)
Sodium: 140 mmol/L (ref 135–145)

## 2021-10-23 MED ORDER — ASPIRIN 81 MG PO CHEW: 81.0000 mg | CHEWABLE_TABLET | Freq: Every day | ORAL | 0 refills | Status: DC

## 2021-10-23 MED ORDER — EZETIMIBE 10 MG PO TABS: 10.0000 mg | ORAL_TABLET | Freq: Every day | ORAL | Status: DC

## 2021-10-23 MED ORDER — CARVEDILOL 12.5 MG PO TABS: 12.5000 mg | ORAL_TABLET | Freq: Two times a day (BID) | ORAL | 0 refills | Status: DC

## 2021-10-23 MED ORDER — SACUBITRIL-VALSARTAN 49-51 MG PO TABS
1.0000 | ORAL_TABLET | Freq: Two times a day (BID) | ORAL | Status: DC
  Administered 2021-10-23: 1 via ORAL
  Filled 2021-10-23: qty 1

## 2021-10-23 MED ORDER — SACUBITRIL-VALSARTAN 49-51 MG PO TABS: 1.0000 | ORAL_TABLET | Freq: Two times a day (BID) | ORAL | 0 refills | Status: DC

## 2021-10-23 MED ORDER — EZETIMIBE 10 MG PO TABS: 10.0000 mg | ORAL_TABLET | Freq: Every day | ORAL | 0 refills | Status: DC

## 2021-10-23 MED ORDER — PANTOPRAZOLE SODIUM 40 MG PO TBEC: 40.0000 mg | DELAYED_RELEASE_TABLET | Freq: Every day | ORAL | 0 refills | Status: DC

## 2021-10-23 MED ORDER — CARVEDILOL 12.5 MG PO TABS: 12.5000 mg | ORAL_TABLET | Freq: Two times a day (BID) | ORAL | Status: DC

## 2021-10-23 NOTE — Plan of Care (Signed)

## 2021-10-23 NOTE — Progress Notes (Signed)
Progress Note  Patient Name: Clarence [name] Date of Encounter: 10/23/2021  [facility] Cardiologist: Nelva Bush, MD   Subjective   Patient seen on AM rounds. Denies any chest pain or shortness of breath. Blood pressures increasing overnight. No outputs recorded.   Inpatient Medications    Scheduled Meds:  aspirin  81 mg Oral Daily   atorvastatin  80 mg Oral QPM   carvedilol  6.25 mg Oral BID WC   clopidogrel  75 mg Oral Daily   dapagliflozin propanediol  10 mg Oral Daily   furosemide  20 mg Oral QODAY   isosorbide mononitrate  30 mg Oral Daily   pantoprazole  40 mg Oral Daily   sacubitril-valsartan  1 tablet Oral BID   Continuous Infusions:  PRN Meds: acetaminophen, nitroGLYCERIN, ondansetron (ZOFRAN) IV   Vital Signs    Vitals:   10/22/21 2100 10/22/21 2338 10/23/21 0525 10/23/21 0741  BP: (!) 147/84 (!) 156/102 (!) 143/103 (!) 145/93  Pulse:  77 84 62  Resp: (!) 21 18 18  (!) 24  Temp: 98.5 F (36.9 C) 97.9 F (36.6 C) 97.9 F (36.6 C) 98.2 F (36.8 C)  TempSrc: Oral  Oral Oral  SpO2:  100% 97% 98%  Weight:   95.5 kg   Height:        Intake/Output Summary (Last 24 hours) at 10/23/2021 0938 Last data filed at 10/22/2021 1830 Gross per 24 hour  Intake 914.5 ml  Output --  Net 914.5 ml      10/23/2021    5:25 AM 10/20/2021    7:42 PM 10/20/2021    7:37 PM  Last 3 Weights  Weight (lbs) 210 lb 8.6 oz 215 lb 215 lb  Weight (kg) 95.5 kg 97.523 kg 97.523 kg      Telemetry    SB-SR with unifocal PVC's rate 60-70's- Personally Reviewed  ECG    No new tracings - Personally Reviewed  Physical Exam   GEN: No acute distress.   Neck: No JVD appreciated Cardiac: RRR, no murmurs, rubs, or gallops.  Respiratory: Clear to auscultation bilaterally. Respirations are unlabored on room air. GI: Soft, nontender, non-distended bowel sounds positive times all 4 quadrants MS: No edema; No deformity. Neuro:  Nonfocal  Psych: Normal affect   Labs    High  Sensitivity Troponin:   Recent Labs  Lab 10/07/21 0659 10/20/21 1943 10/20/21 2215 10/21/21 0850 10/21/21 1545  TROPONINIHS 221* 45* 191* 587* 330*     Chemistry Recent Labs  Lab 10/20/21 2317 10/21/21 0850 10/22/21 0321 10/23/21 0533  NA  --  138 139 140  K  --  3.6 4.1 4.2  CL  --  107 106 108  CO2  --  24 28 26   GLUCOSE  --  133* 105* 106*  BUN  --  20 18 20   CREATININE  --  1.05 1.19 1.16  CALCIUM  --  9.2 9.1 9.2  MG 2.0  --   --   --   GFRNONAA  --  >60 >60 >60  ANIONGAP  --  7 5 6     Lipids No results for input(s): "CHOL", "TRIG", "HDL", "LABVLDL", "LDLCALC", "CHOLHDL" in the last 168 hours.  Hematology Recent Labs  Lab 10/20/21 1943 10/21/21 0545  WBC 8.6 8.0  RBC 4.84 4.25  HGB 13.6 12.0*  HCT 39.9 34.9*  MCV 82.4 82.1  MCH 28.1 28.2  MCHC 34.1 34.4  RDW 14.6 14.6  PLT 195 159   Thyroid No  results for input(s): "TSH", "FREET4" in the last 168 hours.  BNP Recent Labs  Lab 10/20/21 1945  BNP 300.6*    DDimer No results for input(s): "DDIMER" in the last 168 hours.   Radiology    No results found.  Cardiac Studies  Echocardiogram completed on 10/07/2021 1. Left ventricular ejection fraction, by estimation, is 35 to 40%. The  left ventricle has moderately decreased function. The left ventricle  demonstrates global hypokinesis. There is moderate left ventricular  hypertrophy. Left ventricular diastolic  parameters are consistent with Grade I diastolic dysfunction (impaired  relaxation).   2. Right ventricular systolic function is normal. The right ventricular  size is normal.   3. Right atrial size was mildly dilated.   4. The mitral valve is normal in structure. Mild mitral valve  regurgitation. No evidence of mitral stenosis.   5. The aortic valve is tricuspid. Aortic valve regurgitation is not  visualized. No aortic stenosis is present.   6. There is mild dilatation of the ascending aorta, measuring 40 mm.   Right/Left Heart cath  completed on 08/07/2021 Diffuse moderate-severe, multivessel coronary artery disease, including chronic total occlusion of mid RCA, diffuse plaquing of LAD with up to 60% stenosis in the mid vessel, 60-70% proximal/mid LCx stenosis, and 90% lesion of OM1. Moderately elevated left heart filling pressures (LVEDP/PCWP 25 mmHg). Severely elevated right heart filling pressures (mean RA 16 mmHg, RVEDP 30 mmHg). Moderate-severe pulmonary hypertension (mean PAP 42 mmHg, PVR 4.0 WU). Moderately reduced Fick cardiac output/index (CO 4.2 L/min, CI 2.0 L/min/m^2).Marland Kitchen    Patient Profile     51 y.o. 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  Assessment & Plan    Hypertensive urgency -continue entresto 49/51 mg daily -continue carvedilol 6.25 mg bid -no evidence of renal artery stenosis on CT angio done 10/10/2021 -blood pressures increasing overnight  2. Elevated Hs troponins likely in the setting of demand ischemia from hypertensive urgency with systolic blood pressure over 200 -heparin drip was infused for 48 hours -patient is chest pain free -EKG PRN for changes -cath films have been reviewed from 06/2021 and again there was no favorable PCI targets, only considerations would be subtotally occluded OM branch that was relatively small, continue with escalation of medical therapy  3. Multivessel CAD - continue plavix 75 mg daily - continue ASA 81 mg daily - continue Imdur 30 mg daily - LHC on 07/2021 revealed complete total occlusion of the RCA with otherwise nonobstructive disease continued with medical management  4. HFrEF, mixed ICM, NICM, with pulmonary hypertension -echocardiogram revealed LVEF 32-54%, grade 1 diastolic dysfunction, mildly dilated right atrium, mild  mitral regurgitation, and mild ascending aortic dilatation measuring 40 mm -BNP 300.6 -appears euvolemic -entresto increased to 49/51 mg bid by primary team this morning -PTA lasix every other day -continue farxiga -avoid spironolactone as previously had to be discontinued due to hyperkalemia -continue to escalate GDMT as tolerated -with continued multiple admissions would benefit from outpatient consultation with the advanced heart failure group in [place]  5. CKD stage 3 -creatinine baseline 1.30 -creatinine today 1.16 -daily BMP     For questions or updates, please contact [place] Please consult [email or web address] for contact info under        Signed, Lulla Linville, NP  10/23/2021, 9:38 AM

## 2021-10-23 NOTE — Discharge Summary (Signed)
Physician Discharge Summary   Patient: Clarence [name] MRN: [id number] DOB: [birth year]  Admit date:     10/20/2021  Discharge date: 10/23/21  Discharge Physician: Loletha Grayer   PCP: Pcp, No red  Recommendations at discharge:   We will need to schedule with PCP Follow-up cardiology  Discharge Diagnoses: Principal Problem:   Demand ischemia , possible NSTEMI Active Problems:   Hypertensive emergency   Essential hypertension   Chronic HFrEF (heart failure with reduced ejection fraction) (HCC)   Ischemic cardiomyopathy   Crohn's disease ([place])   CKD (chronic kidney disease) stage 2, GFR 60-89 ml/min   Dyslipidemia   Pulmonary hypertension (HCC)   NSTEMI (non-ST elevated myocardial infarction) [facility])    Hospital Course: Dated on 10/20/2021 and discharged on 10/23/2021.  Patient came in with chest pain and shortness of breath.  Troponins peaked at 330, LDL was 43.  The patient was placed on IV heparin for 48 hours.  Cardiology reviewed prior cardiac catheterization report and recommended medical management.  For his hypertensive emergency the patient was started on Entresto.  Assessment and Plan: * Demand ischemia , possible NSTEMI Multivessel coronary artery disease Cath 07/2021: Diffuse moderate-severe, multivessel coronary artery disease, including chronic total occlusion of mid RCA, diffuse plaquing of LAD with up to 60% stenosis in the mid vessel, 60-70% proximal/mid LCx stenosis, and 90% lesion of OM1. Completed heparin for 48 hours Continue Lipitor, Entresto, Coreg  Felt okay walking around without any further chest pain.  Hypertensive emergency Continue home Lasix. Carvedilol dose increased to 12.5 mg twice daily.  Entresto started during the hospital course.  Blood pressure 127/84 upon discharge  Chronic HFrEF (heart failure with reduced ejection fraction) (HCC) Ischemic cardiomyopathy.  Patient on Entresto, Imdur Lasix Coreg   CKD (chronic kidney disease) stage 2, GFR  60-89 ml/min Renal function at baseline.  Monitor closely with diuresis.  Pulmonary hypertension ([place]) As reviewed on cardiac cath report from March 23  Dyslipidemia Continue statin  Obesity with a BMI of 30.21         Consultants: Cardiology Procedures performed: None Disposition: Home Diet recommendation:  Cardiac diet DISCHARGE MEDICATION: Allergies as of 10/23/2021       Reactions   Ibuprofen Other (See Comments), Hives   Other reaction(s): GI Upset (intolerance) Hives   Neomycin-bacitracin Zn-polymyx    Other reaction(s): Other (See Comments)   Sulfacetamide Other (See Comments)        Medication List     STOP taking these medications    losartan 25 MG tablet Commonly known as: COZAAR   PEPCID AC PO       TAKE these medications    aspirin 81 MG chewable tablet Chew 1 tablet (81 mg total) by mouth daily. Start taking on: October 24, 2021   atorvastatin 80 MG tablet Commonly known as: LIPITOR TAKE 1 TABLET BY MOUTH EVERY EVENING   carvedilol 12.5 MG tablet Commonly known as: COREG Take 1 tablet (12.5 mg total) by mouth 2 (two) times daily with a meal. What changed:  medication strength how much to take   clopidogrel 75 MG tablet Commonly known as: PLAVIX Take 1 tablet (75 mg total) by mouth daily.   ezetimibe 10 MG tablet Commonly known as: ZETIA Take 1 tablet (10 mg total) by mouth daily. Start taking on: October 24, 2021   Farxiga 10 MG Tabs tablet Generic drug: dapagliflozin propanediol TAKE 1 TABLET BY MOUTH EVERY DAY   furosemide 20 MG tablet Commonly known as: LASIX Take  1 tablet (20 mg total) by mouth every other day.   isosorbide mononitrate 30 MG 24 hr tablet Commonly known as: IMDUR Take 1 tablet (30 mg total) by mouth daily.   ondansetron 4 MG disintegrating tablet Commonly known as: ZOFRAN-ODT Take 4 mg by mouth every 8 (eight) hours as needed for nausea or vomiting.   pantoprazole 40 MG tablet Commonly known as:  PROTONIX Take 1 tablet (40 mg total) by mouth daily. Start taking on: October 24, 2021   sacubitril-valsartan 49-51 MG Commonly known as: ENTRESTO Take 1 tablet by mouth 2 (two) times daily.        Follow-up Information     End, Harrell Gave, MD Follow up in 1 week(s).   Specialty: Cardiology Contact information: [address] [place] [address] [phone]         Gladstone Lighter, MD Follow up in 4 week(s).   Specialty: Internal Medicine Why: new hospital follow up Contact information: [place] [address] [phone]                Discharge Exam: Clarence [name] Weights   10/20/21 1937 10/20/21 1942 10/23/21 0525  Weight: 97.5 kg 97.5 kg 95.5 kg   Physical Exam HENT:     Head: Normocephalic.     Mouth/Throat:     Pharynx: No oropharyngeal exudate.  Eyes:     General: Lids are normal.     Conjunctiva/sclera: Conjunctivae normal.  Cardiovascular:     Rate and Rhythm: Normal rate and regular rhythm.     Heart sounds: Normal heart sounds, S1 normal and S2 normal.  Pulmonary:     Breath sounds: Normal breath sounds. No decreased breath sounds, wheezing, rhonchi or rales.  Abdominal:     Palpations: Abdomen is soft.     Tenderness: There is no abdominal tenderness.  Musculoskeletal:     Right lower leg: No swelling.     Left lower leg: No swelling.  Skin:    General: Skin is warm.     Findings: No rash.  Neurological:     Mental Status: He is alert and oriented to person, place, and time.      Condition at discharge: stable  The results of significant diagnostics from this hospitalization (including imaging, microbiology, ancillary and laboratory) are listed below for reference.   Imaging Studies: DG Chest 2 View  Result Date: 10/20/2021 CLINICAL DATA:  Chest pain and shortness of breath. EXAM: CHEST - 2 VIEW COMPARISON:  Oct 06, 2021. FINDINGS: The heart size and mediastinal contours are within normal limits.  There is no evidence of acute infiltrate, pleural effusion or pneumothorax. The visualized skeletal structures are unremarkable. IMPRESSION: No active cardiopulmonary disease. Electronically Signed   By: Virgina Norfolk M.D.   On: 10/20/2021 20:19   CT Angio Abd/Pel w/ and/or w/o  Result Date: 10/10/2021 CLINICAL DATA:  Renal artery stenosis, hypertensive emergency EXAM: CTA ABDOMEN AND PELVIS WITHOUT AND WITH CONTRAST TECHNIQUE: Multidetector CT imaging of the abdomen and pelvis was performed using the standard protocol during bolus administration of intravenous contrast. Multiplanar reconstructed images and MIPs were obtained and reviewed to evaluate the vascular anatomy. RADIATION DOSE REDUCTION: This exam was performed according to the departmental dose-optimization program which includes automated exposure control, adjustment of the mA and/or kV according to patient size and/or use of iterative reconstruction technique. CONTRAST:  134m OMNIPAQUE IOHEXOL 350 MG/ML SOLN COMPARISON:  Renal artery duplex ultrasound 10/08/2021; CT scan of the abdomen and pelvis  02/06/2020 FINDINGS: VASCULAR Aorta: Normal caliber aorta without aneurysm, dissection, vasculitis or significant stenosis. Celiac: Patent without evidence of aneurysm, dissection, vasculitis or significant stenosis. SMA: Patent without evidence of aneurysm, dissection, vasculitis or significant stenosis. Renals: Both renal arteries are patent without evidence of aneurysm, dissection, vasculitis, fibromuscular dysplasia or significant stenosis. IMA: Patent without evidence of aneurysm, dissection, vasculitis or significant stenosis. Inflow: Patent without evidence of aneurysm, dissection, vasculitis or significant stenosis. Proximal Outflow: Bilateral common femoral and visualized portions of the superficial and profunda femoral arteries are patent without evidence of aneurysm, dissection, vasculitis or significant stenosis. Veins: No focal venous  abnormality. Review of the MIP images confirms the above findings. NON-VASCULAR Lower chest: No acute abnormality. Hepatobiliary: No focal liver abnormality is seen. No gallstones, gallbladder wall thickening, or biliary dilatation. Pancreas: Unremarkable. No pancreatic ductal dilatation or surrounding inflammatory changes. Spleen: Normal in size without focal abnormality. Adrenals/Urinary Tract: Adrenal glands are unremarkable. Kidneys are normal, without renal calculi, focal solid lesion, or hydronephrosis. Bladder is unremarkable. Small low-attenuation lesions in the lower pole of the right kidney demonstrate no interval change compared to prior imaging from September of 2021. While too small for accurate characterization these almost certainly represent benign cysts and no further follow-up is recommended. 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 There is some fecalization of the bowel contents. No evidence of bowel obstruction. Lymphatic: No suspicious lymphadenopathy. Reproductive: Prostate is unremarkable. Other: No ascites. Musculoskeletal: No acute or significant osseous findings. L5-S1 degenerative disc disease. IMPRESSION: VASCULAR 1. CT arteriography imaging demonstrates divergent findings from the prior renal artery ultrasound. There are only single renal arteries bilaterally with no evidence of stenosis, fibromuscular dysplasia or other abnormality. NON-VASCULAR 1. No acute abnormality within the abdomen or pelvis. 2. 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 Findings are similar compared to prior imaging from September of 2021. 3. L5-S1 degenerative disc disease. Signed, Criselda Peaches, MD, [facility] Radiology Electronically Signed   By: Jacqulynn Cadet M.D.   On: 10/10/2021 15:46   US RENAL ARTERY DUPLEX COMPLETE  Result Date: 10/08/2021 CLINICAL DATA:  51 year old male with hypertensive emergency EXAM: RENAL/URINARY TRACT ULTRASOUND RENAL DUPLEX DOPPLER ULTRASOUND COMPARISON:  None Available. FINDINGS: Right Kidney: Length: 10.1 x 5.4 x 5.8 cm for a total volume of 165 mL. Echogenicity within normal limits. No mass or hydronephrosis visualized. Left Kidney: Length: 10.7 x 4.9 x 4.7 cm for a total volume of 128 mL. Echogenicity within normal limits. No mass or hydronephrosis visualized. Bladder: RENAL DUPLEX ULTRASOUND Right Renal Artery Velocities: (There are 2 right-sided renal arteries; the highest peak systolic velocity will be recorded) Origin:  227 cm/sec Mid:  143 cm/sec Hilum:  80 cm/sec Interlobar:  41 cm/sec Arcuate:  22 cm/sec Left Renal Artery Velocities: Origin:  135 cm/sec Mid:  129 cm/sec Hilum:  100 cm/sec Interlobar:  33 cm/sec Arcuate:  28 cm/sec Aortic Velocity:  88 cm/sec Right Renal-Aortic Ratios: Origin: 2.6 Mid:  1.6 Hilum: 0.9 Interlobar: 0.5 Arcuate: 0.3 Left Renal-Aortic Ratios: Origin: 1.5 Mid: 1.5 Hilum: 1.1 Interlobar: 0.4 Arcuate: 0.3 IMPRESSION: 1. Two right-sided renal arteries, single left-sided renal artery. 2. Elevated peak systolic velocity greater than 200 centimeters/second in 1 of the 2 right renal arteries raises  possibility of underlying stenosis. CT or MR arteriogram of the abdomen could further evaluate. 3. No evidence of left renal artery stenosis. 4. Normal sonographic appearance of the kidneys without evidence of hydronephrosis. Signed, Criselda Peaches, MD, [facility] Radiology Electronically Signed   By: Jacqulynn Cadet M.D.   On: 10/08/2021 16:11   ECHOCARDIOGRAM COMPLETE  Result Date: 10/07/2021     ECHOCARDIOGRAM REPORT   Patient Name:   DEBRA CALABRETTA [facility] Date of Exam: 10/07/2021 Medical Rec #:  [id number]         Height:       70.0 in Accession #:    [id number]        Weight:       216.0 lb Date of Birth:  [birth year]          BSA:          2.157 m Patient Age:    70 years          BP:           140/97 mmHg Patient Gender: M                 HR:           75 bpm. Exam Location:  [facility] Procedure: 2D Echo, Cardiac Doppler and Color Doppler Indications:     Elevated troponin  History:         Patient has prior history of Echocardiogram examinations, most                  recent 08/05/2021. CHF and Cardiomyopathy, Cardiac                  catheterization 08/07/21; Risk Factors:Former Smoker,                  Hypertension and Dyslipidemia.  Sonographer:     Rosalia Hammers Referring Phys:  [id number] [name] A MANSY Diagnosing Phys: Nelva Bush MD IMPRESSIONS  1. Left ventricular ejection fraction, by estimation, is 35 to 40%. The left ventricle has moderately decreased function. The left ventricle demonstrates global hypokinesis. There is moderate left ventricular hypertrophy. Left ventricular diastolic parameters are consistent with Grade I diastolic dysfunction (impaired relaxation).  2. Right ventricular systolic function is normal. The right ventricular size is normal.  3. Right atrial size was mildly dilated.  4. The mitral valve is normal in structure. Mild mitral valve regurgitation. No evidence of mitral stenosis.  5. The aortic valve is tricuspid. Aortic valve regurgitation is not visualized. No aortic stenosis is present.  6. There is mild dilatation of the ascending aorta, measuring 40 mm. FINDINGS  Left Ventricle: Left ventricular ejection fraction, by estimation, is 35 to 40%. The left ventricle has moderately decreased function. The left ventricle demonstrates global hypokinesis. The left ventricular internal cavity size was normal in size. There is moderate left ventricular hypertrophy. Left ventricular  diastolic parameters are consistent with Grade I diastolic dysfunction (impaired relaxation).  LV Wall Scoring: The entire inferior wall and posterior wall are akinetic. The entire anterior wall, antero-lateral wall, entire septum, apical lateral segment, and apex are hypokinetic. Right Ventricle: The right ventricular size is normal. No increase in right ventricular wall thickness. Right ventricular systolic function is normal. Left Atrium: Left atrial size was normal in size. Right Atrium: Right atrial size was mildly dilated. Pericardium: There is no evidence of pericardial effusion. Mitral Valve: The mitral valve is normal in structure. Mild mitral valve regurgitation.  No evidence of mitral valve stenosis. MV peak gradient, 4.8 mmHg. The mean mitral valve gradient is 2.0 mmHg. Tricuspid Valve: The tricuspid valve is normal in structure. Tricuspid valve regurgitation is trivial. Aortic Valve: The aortic valve is tricuspid. Aortic valve regurgitation is not visualized. No aortic stenosis is present. Aortic valve mean gradient measures 3.0 mmHg. Aortic valve peak gradient measures 4.8 mmHg. Aortic valve area, by VTI measures 3.17 cm. Pulmonic Valve: The pulmonic valve was normal in structure. Pulmonic valve regurgitation is not visualized. No evidence of pulmonic stenosis. Aorta: The aortic root is normal in size and structure. There is mild dilatation of the ascending aorta, measuring 40 mm. Pulmonary Artery: The pulmonary artery is of normal size. IAS/Shunts: The interatrial septum was not well visualized.  LEFT VENTRICLE PLAX 2D LVIDd:         5.24 cm LVIDs:         4.44 cm LV PW:         1.46 cm LV IVS:        1.33 cm LVOT diam:     2.10 cm LV SV:         59 LV SV Index:   27 LVOT Area:     3.46 cm  RIGHT VENTRICLE RV Basal diam:  2.90 cm LEFT ATRIUM             Index        RIGHT ATRIUM           Index LA diam:        3.60 cm 1.67 cm/m   RA Area:     19.10 cm LA Vol (A2C):   64.8 ml 30.04 ml/m  RA Volume:    53.40 ml  24.76 ml/m LA Vol (A4C):   53.5 ml 24.80 ml/m LA Biplane Vol: 61.0 ml 28.28 ml/m  AORTIC VALVE                    PULMONIC VALVE AV Area (Vmax):    2.76 cm     PV Vmax:       0.90 m/s AV Area (Vmean):   2.19 cm     PV Vmean:      63.400 cm/s AV Area (VTI):     3.17 cm     PV VTI:        0.161 m AV Vmax:           110.00 cm/s  PV Peak grad:  3.3 mmHg AV Vmean:          84.800 cm/s  PV Mean grad:  2.0 mmHg AV VTI:            0.186 m AV Peak Grad:      4.8 mmHg AV Mean Grad:      3.0 mmHg LVOT Vmax:         87.50 cm/s LVOT Vmean:        53.700 cm/s LVOT VTI:          0.170 m LVOT/AV VTI ratio: 0.91  AORTA Ao Root diam: 3.70 cm MITRAL VALVE MV Area (PHT): 4.29 cm    SHUNTS MV Area VTI:   2.84 cm    Systemic VTI:  0.17 m MV Peak grad:  4.8 mmHg    Systemic Diam: 2.10 cm MV Mean grad:  2.0 mmHg MV Vmax:       1.10 m/s MV Vmean:      53.2 cm/s MV Decel Time: 177 msec MV  E velocity: 45.40 cm/s MV A velocity: 85.30 cm/s MV E/A ratio:  0.53 Nelva Bush MD Electronically signed by Nelva Bush MD Signature Date/Time: 10/07/2021/10:42:48 AM    Final    DG Chest 2 View  Result Date: 10/06/2021 CLINICAL DATA:  Chest pain. EXAM: CHEST - 2 VIEW COMPARISON:  Chest radiograph dated September 14, 2021 FINDINGS: The heart size and mediastinal contours are within normal limits. Both lungs are clear. The visualized skeletal structures are unremarkable. IMPRESSION: No active cardiopulmonary disease. Electronically Signed   By: Keane Police D.O.   On: 10/06/2021 20:51    Microbiology: Results for orders placed or performed during the hospital encounter of 09/14/21  Blood culture (routine x 2)     Status: None   Collection Time: 09/14/21 10:05 PM   Specimen: BLOOD LEFT HAND  Result Value Ref Range Status   Specimen Description BLOOD LEFT HAND  Final   Special Requests   Final    BOTTLES DRAWN AEROBIC ONLY Blood Culture results may not be optimal due to an inadequate volume of blood received in culture  bottles   Culture   Final    NO GROWTH 5 DAYS Performed at [facility], [place] [address], [place], [place] [address]    Report Status 09/19/2021 FINAL  Final  Blood culture (routine x 2)     Status: None   Collection Time: 09/14/21 10:06 PM   Specimen: BLOOD LEFT HAND  Result Value Ref Range Status   Specimen Description BLOOD LEFT HAND  Final   Special Requests   Final    BOTTLES DRAWN AEROBIC AND ANAEROBIC Blood Culture results may not be optimal due to an inadequate volume of blood received in culture bottles   Culture   Final    NO GROWTH 5 DAYS Performed at [facility], [place] [address], [place], [place] [address]    Report Status 09/19/2021 FINAL  Final    Labs: CBC: Recent Labs  Lab 10/20/21 1943 10/21/21 0545  WBC 8.6 8.0  HGB 13.6 12.0*  HCT 39.9 34.9*  MCV 82.4 82.1  PLT 195 601   Basic Metabolic Panel: Recent Labs  Lab 10/20/21 1943 10/20/21 2317 10/21/21 0850 10/22/21 0321 10/23/21 0533  NA 138  --  138 139 140  K 3.7  --  3.6 4.1 4.2  CL 106  --  107 106 108  CO2 24  --  24 28 26   GLUCOSE 135*  --  133* 105* 106*  BUN 21*  --  20 18 20   CREATININE 1.30*  --  1.05 1.19 1.16  CALCIUM 9.4  --  9.2 9.1 9.2  MG  --  2.0  --   --   --      Discharge time spent: greater than 30 minutes.  Signed: Loletha Grayer, MD [facility] 10/23/2021

## 2021-10-27 NOTE — Progress Notes (Deleted)
Patient ID: Clarence [name], male    DOB: 11/03/1970, 51 y.o.   MRN: [id number]  HPI  Clarence [name] is a 51 y/o male with a history of HTN, CKD, Crohn's, tobacco use and chronic heart failure.   Echo report from 10/07/21 reviewed and showed an EF of 35-40% along with moderate LVH and mild Clarence. Echo report from 08/05/21 reviewed and showed an EF of 30-35% along with moderate LVH, mild LAE and mild/moderate Clarence.   LHC done 08/07/21 showed: Diffuse moderate-severe, multivessel coronary artery disease, including chronic total occlusion of mid RCA, diffuse plaquing of LAD with up to 60% stenosis in the mid vessel, 60-70% proximal/mid LCx stenosis, and 90% lesion of OM1. Moderately elevated left heart filling pressures (LVEDP/PCWP 25 mmHg). Severely elevated right heart filling pressures (mean RA 16 mmHg, RVEDP 30 mmHg). Moderate-severe pulmonary hypertension (mean PAP 42 mmHg, PVR 4.0 WU). Moderately reduced Fick cardiac output/index (CO 4.2 L/min, CI 2.0 L/min/m^2).Marland Kitchen  Admitted 10/20/21 due to chest pain and shortness of breath. Placed on IV heparin for 48 hours. Entresto started for HTN. Cardiology consult obtained.       Discharged after 3 days. Admitted 10/06/21 due to acute onset of left shoulder pain followed by midsternal chest pain felt as tightness, moderate in intensity with associated radiation to the left arm with numbness. Cardiology and vascular consults obtained. Obtained Renal ultrasound with possible RAS, ordered CTA that did not show renal artery stenosis. Elevated troponin thought to be due to demand ischemia. Placed on lasix QOD. Discharged after 5 days. 1 admission and 2 ED visits April 2023. Admitted 08/04/21 due to worsening dyspnea on exertion as well as orthopnea and lower extremity swelling. Lasix 80 IV given as well as corticosteroid and azithromycin/ceftriaxone. Pt had cardiac cath which showed mod-severe multivessel disease that will be treated medically. Cardiology consult obtained.  Elevated troponin thought to be due to demand ischemia. Discharged after 6 days.   He presents today for a follow-up visit with a chief complaint of   Past Medical History:  Diagnosis Date   CHF (congestive heart failure) ([place])    Chronic kidney disease    Crohn's disease ([place])    Hypertension    Proteinuria    Past Surgical History:  Procedure Laterality Date   COLECTOMY     RIGHT/LEFT HEART CATH AND CORONARY ANGIOGRAPHY N/A 08/07/2021   Procedure: RIGHT/LEFT HEART CATH AND CORONARY ANGIOGRAPHY;  Surgeon: Nelva Bush, MD;  Location: [facility];  Service: Cardiovascular;  Laterality: N/A;   Family History  Problem Relation Age of Onset   Heart disease Maternal Uncle    Pancreatic cancer Maternal Uncle    Cancer Maternal Aunt    Social History   Tobacco Use   Smoking status: Former    Types: Cigars    Quit date: 08/13/2021    Years since quitting: 0.2   Smokeless tobacco: Not on file  Substance Use Topics   Alcohol use: Not Currently    Alcohol/week: 1.0 standard drink of alcohol    Types: 1 Standard drinks or equivalent per week   Allergies  Allergen Reactions   Ibuprofen Other (See Comments) and Hives    Other reaction(s): GI Upset (intolerance) Hives    Neomycin-Bacitracin Zn-Polymyx     Other reaction(s): Other (See Comments)   Sulfacetamide Other (See Comments)    Review of Systems  Constitutional:  Positive for fatigue (easily). Negative for appetite change.  HENT:  Negative for congestion, postnasal drip and sore  throat.   Eyes: Negative.   Respiratory:  Positive for cough and shortness of breath.   Cardiovascular:  Positive for chest pain (when walking). Negative for palpitations and leg swelling.  Gastrointestinal:  Negative for abdominal distention and abdominal pain.  Endocrine: Negative.   Genitourinary: Negative.   Musculoskeletal:  Negative for back pain and neck pain.  Skin: Negative.   Allergic/Immunologic: Negative.    Neurological:  Positive for light-headedness. Negative for dizziness.  Hematological:  Negative for adenopathy. Does not bruise/bleed easily.  Psychiatric/Behavioral:  Negative for dysphoric mood and sleep disturbance (sleeping on 4 pillows). The patient is not nervous/anxious.        Physical Exam Vitals and nursing note reviewed.  Constitutional:      Appearance: Normal appearance.  HENT:     Head: Normocephalic and atraumatic.  Cardiovascular:     Rate and Rhythm: Regular rhythm. Bradycardia present.  Pulmonary:     Effort: Pulmonary effort is normal. No respiratory distress.     Breath sounds: No wheezing or rales.  Abdominal:     General: There is no distension.     Palpations: Abdomen is soft.  Musculoskeletal:        General: No tenderness.     Cervical back: Normal range of motion and neck supple.     Right lower leg: No edema.     Left lower leg: No edema.  Skin:    General: Skin is warm and dry.  Neurological:     General: No focal deficit present.     Mental Status: He is alert and oriented to person, place, and time.  Psychiatric:        Mood and Affect: Mood normal.        Behavior: Behavior normal.        Thought Content: Thought content normal.    Assessment & Plan:  1: Chronic heart failure with reduced ejection fraction- - NYHA class III - euvolemic today - not weighing daily but does have scales; encouraged to resume weighing daily so that he can call for an overnight weight gain of > 2 pounds or a weekly weight gain of > 5 pounds - weight 217 pounds from last visit here 6 weeks ago - not adding salt and has been trying to read food labels for sodium content - reviewed the importance of keeping daily fluid intake to 60-64 ounces/ daily - saw cardiology (Dunn) 10/01/21 - on GDMT of farxiga, losartan & carvedilol - developed hyperkalemia with spironolactone - has been walking some but admits that he gets anxious; discussed pulmonary rehab and he is  interested in that so a referral was placed - BNP 10/20/21 was 300.6 - PharmD reconciled medications with the patient  2: HTN with CKD- - BP  - seeing new PCP at [facility] on 11/05/21 - BMP 10/23/21 showed sodium 140, potassium 4.2, creatinine 1.16 & GFR >60  3: Tobacco use- - continues to not smoke - continued cessation discussed    Medication list that he brought was reviewed.

## 2021-10-28 ENCOUNTER — Telehealth: Payer: Self-pay | Admitting: Family

## 2021-10-28 ENCOUNTER — Ambulatory Visit: Payer: Managed Care, Other (non HMO) | Admitting: Family

## 2021-10-28 NOTE — Telephone Encounter (Signed)
Patient did not show for his [facility] appointment on 10/28/21. Will attempt to reschedule.

## 2021-10-29 ENCOUNTER — Telehealth: Payer: Self-pay

## 2021-10-29 ENCOUNTER — Ambulatory Visit: Payer: Managed Care, Other (non HMO) | Attending: Family | Admitting: Family

## 2021-10-29 ENCOUNTER — Encounter: Payer: Self-pay | Admitting: Family

## 2021-10-29 VITALS — BP 123/81 | HR 59 | Resp 20 | Ht 70.0 in | Wt 220.2 lb

## 2021-10-29 DIAGNOSIS — I5022 Chronic systolic (congestive) heart failure: Secondary | ICD-10-CM | POA: Insufficient documentation

## 2021-10-29 DIAGNOSIS — F17290 Nicotine dependence, other tobacco product, uncomplicated: Secondary | ICD-10-CM | POA: Insufficient documentation

## 2021-10-29 DIAGNOSIS — N189 Chronic kidney disease, unspecified: Secondary | ICD-10-CM | POA: Diagnosis not present

## 2021-10-29 DIAGNOSIS — I5020 Unspecified systolic (congestive) heart failure: Secondary | ICD-10-CM

## 2021-10-29 DIAGNOSIS — E875 Hyperkalemia: Secondary | ICD-10-CM | POA: Diagnosis not present

## 2021-10-29 DIAGNOSIS — Z7901 Long term (current) use of anticoagulants: Secondary | ICD-10-CM | POA: Insufficient documentation

## 2021-10-29 DIAGNOSIS — Z7902 Long term (current) use of antithrombotics/antiplatelets: Secondary | ICD-10-CM | POA: Insufficient documentation

## 2021-10-29 DIAGNOSIS — F419 Anxiety disorder, unspecified: Secondary | ICD-10-CM | POA: Diagnosis not present

## 2021-10-29 DIAGNOSIS — I272 Pulmonary hypertension, unspecified: Secondary | ICD-10-CM | POA: Diagnosis not present

## 2021-10-29 DIAGNOSIS — I2510 Atherosclerotic heart disease of native coronary artery without angina pectoris: Secondary | ICD-10-CM | POA: Insufficient documentation

## 2021-10-29 DIAGNOSIS — K5090 Crohn's disease, unspecified, without complications: Secondary | ICD-10-CM | POA: Diagnosis not present

## 2021-10-29 DIAGNOSIS — I130 Hypertensive heart and chronic kidney disease with heart failure and stage 1 through stage 4 chronic kidney disease, or unspecified chronic kidney disease: Secondary | ICD-10-CM | POA: Insufficient documentation

## 2021-10-29 DIAGNOSIS — Z7984 Long term (current) use of oral hypoglycemic drugs: Secondary | ICD-10-CM | POA: Insufficient documentation

## 2021-10-29 DIAGNOSIS — Z79899 Other long term (current) drug therapy: Secondary | ICD-10-CM | POA: Diagnosis not present

## 2021-10-29 DIAGNOSIS — I10 Essential (primary) hypertension: Secondary | ICD-10-CM

## 2021-10-29 NOTE — Progress Notes (Signed)
Patient ID: Clarence [name], male    DOB: December 17, 1970, 51 y.o.   MRN: [id number]  HPI  Clarence [name] is a 51 y/o male with a history of HTN, CKD, Crohn's, tobacco use and chronic heart failure.   Echo report from 10/07/21 reviewed and showed an EF of 35-40% along with moderate LVH and mild Clarence. Echo report from 08/05/21 reviewed and showed an EF of 30-35% along with moderate LVH, mild LAE and mild/moderate Clarence.   LHC done 08/07/21 showed: Diffuse moderate-severe, multivessel coronary artery disease, including chronic total occlusion of mid RCA, diffuse plaquing of LAD with up to 60% stenosis in the mid vessel, 60-70% proximal/mid LCx stenosis, and 90% lesion of OM1. Moderately elevated left heart filling pressures (LVEDP/PCWP 25 mmHg). Severely elevated right heart filling pressures (mean RA 16 mmHg, RVEDP 30 mmHg). Moderate-severe pulmonary hypertension (mean PAP 42 mmHg, PVR 4.0 WU). Moderately reduced Fick cardiac output/index (CO 4.2 L/min, CI 2.0 L/min/m^2).Marland Kitchen  Admitted 10/20/21 due to chest pain and shortness of breath. Placed on IV heparin for 48 hours. Entresto started for HTN. Cardiology consult obtained.       Discharged after 3 days. Admitted 10/06/21 due to acute onset of left shoulder pain followed by midsternal chest pain felt as tightness, moderate in intensity with associated radiation to the left arm with numbness. Cardiology and vascular consults obtained. Obtained Renal ultrasound with possible RAS, ordered CTA that did not show renal artery stenosis. Elevated troponin thought to be due to demand ischemia. Placed on lasix QOD. Discharged after 5 days. 1 admission and 2 ED visits April 2023. Admitted 08/04/21 due to worsening dyspnea on exertion as well as orthopnea and lower extremity swelling. Lasix 80 IV given as well as corticosteroid and azithromycin/ceftriaxone. Pt had cardiac cath which showed mod-severe multivessel disease that will be treated medically. Cardiology consult obtained.  Elevated troponin thought to be due to demand ischemia. Discharged after 6 days.   He presents today for a follow-up visit with a chief complaint of minimal fatigue upon moderate exertion. Describes this as chronic in nature although he does feel like it's improving. He has associated shortness of breath, light-headedness and anxiety along with this. He denies any difficulty sleeping, abdominal distention, palpitations, pedal edema, chest pain, cough or weight gain.   Says that he has 2 medications at home that he had picked up but hadn't started them yet but can't recall what they are.   Says that he will be losing his job at the end of June so is trying to plan ahead on what he will do regarding his medications etc  Past Medical History:  Diagnosis Date   CHF (congestive heart failure) ([place])    Chronic kidney disease    Crohn's disease ([place])    Hypertension    Proteinuria    Past Surgical History:  Procedure Laterality Date   COLECTOMY     RIGHT/LEFT HEART CATH AND CORONARY ANGIOGRAPHY N/A 08/07/2021   Procedure: RIGHT/LEFT HEART CATH AND CORONARY ANGIOGRAPHY;  Surgeon: Nelva Bush, MD;  Location: [facility];  Service: Cardiovascular;  Laterality: N/A;   Family History  Problem Relation Age of Onset   Heart disease Maternal Uncle    Pancreatic cancer Maternal Uncle    Cancer Maternal Aunt    Social History   Tobacco Use   Smoking status: Former    Types: Cigars    Quit date: 08/13/2021    Years since quitting: 0.2   Smokeless tobacco: Not on  file  Substance Use Topics   Alcohol use: Not Currently    Alcohol/week: 1.0 standard drink of alcohol    Types: 1 Standard drinks or equivalent per week   Allergies  Allergen Reactions   Ibuprofen Other (See Comments) and Hives    Other reaction(s): GI Upset (intolerance) Hives    Neomycin-Bacitracin Zn-Polymyx     Other reaction(s): Other (See Comments)   Sulfacetamide Other (See Comments)   Prior to Admission  medications   Medication Sig Start Date End Date Taking? Authorizing Provider  acetaminophen (TYLENOL) 325 MG tablet Take 650 mg by mouth at bedtime.   Yes [provider]  aspirin 81 MG chewable tablet Chew 1 tablet (81 mg total) by mouth daily. 10/24/21  Yes Wieting, Richard, MD  atorvastatin (LIPITOR) 80 MG tablet TAKE 1 TABLET BY MOUTH EVERY EVENING 10/20/21  Yes Dunn, Areta Haber, PA-C  carvedilol (COREG) 12.5 MG tablet Take 1 tablet (12.5 mg total) by mouth 2 (two) times daily with a meal. 10/23/21  Yes Wieting, Richard, MD  clopidogrel (PLAVIX) 75 MG tablet Take 1 tablet (75 mg total) by mouth daily. 10/14/21  Yes Dunn, Areta Haber, PA-C  ezetimibe (ZETIA) 10 MG tablet Take 1 tablet (10 mg total) by mouth daily. 10/24/21  Yes Wieting, Richard, MD  famotidine (PEPCID) 10 MG tablet Take 10 mg by mouth at bedtime.   Yes [provider]  FARXIGA 10 MG TABS tablet TAKE 1 TABLET BY MOUTH EVERY DAY 10/20/21  Yes Dunn, Areta Haber, PA-C  ibuprofen (ADVIL) 200 MG tablet Take 200 mg by mouth at bedtime.   Yes [provider]  ondansetron (ZOFRAN-ODT) 4 MG disintegrating tablet Take 4 mg by mouth every 8 (eight) hours as needed for nausea or vomiting.   Yes [provider]  sacubitril-valsartan (ENTRESTO) 49-51 MG Take 1 tablet by mouth 2 (two) times daily. 10/23/21  Yes Wieting, Richard, MD  furosemide (LASIX) 20 MG tablet Take 1 tablet (20 mg total) by mouth every other day. Patient not taking: Reported on 10/29/2021 10/13/21   Alisa Graff, [name]  isosorbide mononitrate (IMDUR) 30 MG 24 hr tablet Take 1 tablet (30 mg total) by mouth daily. Patient not taking: Reported on 10/29/2021 10/12/21 11/11/21  Nolberto Hanlon, MD  pantoprazole (PROTONIX) 40 MG tablet Take 1 tablet (40 mg total) by mouth daily. Patient not taking: Reported on 10/29/2021 10/24/21   Loletha Grayer, MD    Review of Systems  Constitutional:  Positive for fatigue (improving). Negative for appetite change.  HENT:  Negative for  congestion, postnasal drip and sore throat.   Eyes: Negative.   Respiratory:  Positive for shortness of breath. Negative for cough.   Cardiovascular:  Negative for chest pain, palpitations and leg swelling.  Gastrointestinal:  Negative for abdominal distention and abdominal pain.  Endocrine: Negative.   Genitourinary: Negative.   Musculoskeletal:  Negative for back pain and neck pain.  Skin: Negative.   Allergic/Immunologic: Negative.   Neurological:  Positive for light-headedness. Negative for dizziness.  Hematological:  Negative for adenopathy. Does not bruise/bleed easily.  Psychiatric/Behavioral:  Negative for dysphoric mood and sleep disturbance (sleeping on 4 pillows). The patient is nervous/anxious.    Vitals:   10/29/21 1041  BP: 123/81  Pulse: (!) 59  Resp: 20  SpO2: 100%  Weight: 220 lb 4 oz (99.9 kg)  Height: 5' 10"  (1.778 m)   Wt Readings from Last 3 Encounters:  10/29/21 220 lb 4 oz (99.9 kg)  10/23/21 210 lb  8.6 oz (95.5 kg)  10/14/21 217 lb (98.4 kg)   Lab Results  Component Value Date   CREATININE 1.16 10/23/2021   CREATININE 1.19 10/22/2021   CREATININE 1.05 10/21/2021   Physical Exam Vitals and nursing note reviewed.  Constitutional:      Appearance: Normal appearance.  HENT:     Head: Normocephalic and atraumatic.  Cardiovascular:     Rate and Rhythm: Regular rhythm. Bradycardia present.  Pulmonary:     Effort: Pulmonary effort is normal. No respiratory distress.     Breath sounds: No wheezing or rales.  Abdominal:     General: There is no distension.     Palpations: Abdomen is soft.  Musculoskeletal:        General: No tenderness.     Cervical back: Normal range of motion and neck supple.     Right lower leg: No edema.     Left lower leg: No edema.  Skin:    General: Skin is warm and dry.  Neurological:     General: No focal deficit present.     Mental Status: He is alert and oriented to person, place, and time.  Psychiatric:        Mood  and Affect: Mood normal.        Behavior: Behavior normal.        Thought Content: Thought content normal.    Assessment & Plan:  1: Chronic heart failure with reduced ejection fraction- - NYHA class II - euvolemic today - not weighing daily but does have scales; encouraged to resume weighing daily so that he can call for an overnight weight gain of > 2 pounds or a weekly weight gain of > 5 pounds - weight up 3 pounds from last visit here 6 weeks ago - not adding salt and has been trying to read food labels for sodium content - reviewed the importance of keeping daily fluid intake to 60-64 ounces/ daily - saw cardiology (Dunn) 10/01/21 - on GDMT of farxiga, entresto & carvedilol - developed hyperkalemia with spironolactone - has been walking some but admits that he gets anxious; discussed pulmonary rehab and he is interested in that so a referral was placed - BNP 10/20/21 was 300.6 - PharmD reconciled medications with the patient  2: HTN with CKD- - BP looks good (123/81) - seeing new PCP at [facility] on 11/05/21 - BMP 10/23/21 showed sodium 140, potassium 4.2, creatinine 1.16 & GFR >60   Medication bottles reviewed. He will send photos of the medications he has at home that he hasn't started so we can advise him of what to do.   Information provided on [facility] so that he can go ahead and pick up the application and have it ready for when he becomes uninsured.  Return in 2 months, sooner if needed.

## 2021-10-29 NOTE — Patient Instructions (Addendum)
Continue weighing daily and call for an overnight weight gain of 3 pounds or more or a weekly weight gain of more than 5 pounds.  If you have voicemail, please make sure your mailbox is cleaned out so that we may leave a message and please make sure to listen to any voicemails.    If you receive a satisfaction survey regarding the [facility], please take the time to fill it out. This way we can continue to provide excellent care and make any changes that need to be made.    Go to [facility] which is located across the street in the [employer]. Go into the building and walk straight back to the last door on the right hand side

## 2021-10-29 NOTE — Telephone Encounter (Signed)
  Following visit on 6/14, asked patient to send photos of medications for which he did not bring to visit. On afternoon of 6/14, patient sent photos of these medications via MyChart- including spironolactone and furosemide. Followed up with patient via phone call- at which time patient noted that he also found new prescriptions for pantoprazole and isosorbide mononitrate.  Spironolactone: Avoid spironolactone due to previous instances of hyperkalemia per cardiology 6/8. Advised patient to avoid spironolactone and provided instructions for disposal (I.e., place in waste bin with cat litter). Furosemide: Continue furosemide 20 mg every other day. Advised to place this medications with his other maintenance medications, and to bring them at future visits. Isosorbide mononitrate: Continue isosorbide mononitrate 30 mg daily. Advised to place this medications with his other maintenance medications, and to bring them at future visits. Pantoprazole: Advised patient to continue famotidine over the counter and that pantoprazole may be continued as well. Noted side effects to pantoprazole.   Wynelle Cleveland, PharmD Pharmacy Resident  10/29/2021 12:39 PM

## 2021-10-29 NOTE — Progress Notes (Signed)
[name] - PHARMACIST COUNSELING NOTE  Guideline-Directed Medical Therapy/Evidence Based Medicine  ACE/ARB/ARNI: Sacubitril-valsartan 49-51 mg twice daily Beta Blocker: Carvedilol 12.5 mg twice daily Aldosterone Antagonist: None Diuretic: furosemide 20 mg daily (see below) SGLT2i: Dapagliflozin 10 mg daily  Adherence Assessment  Do you ever forget to take your medication? [] Yes [x] No  Do you ever skip doses due to side effects? [] Yes [x] No  Do you have trouble affording your medicines? [] Yes [x] No  Are you ever unable to pick up your medication due to transportation difficulties? [] Yes [x] No  Do you ever stop taking your medications because you don't believe they are helping? [] Yes [x] No  Do you check your weight daily? [] Yes [] No   Adherence strategy: pill box  Barriers to obtaining medications: losing insurance soon d/t occupation  Vital signs: HR 59, BP 123/81, weight (pounds) 220 lb ECHO: Date 10/07/2021, EF 35-40%, notes moderate left ventricular hypertrophy     Latest Ref Rng & Units 10/23/2021    5:33 AM 10/22/2021    3:21 AM 10/21/2021    8:50 AM  BMP  Glucose 70 - 99 mg/dL 106  105  133   BUN 6 - 20 mg/dL 20  18  20    Creatinine 0.61 - 1.24 mg/dL 1.16  1.19  1.05   Sodium 135 - 145 mmol/L 140  139  138   Potassium 3.5 - 5.1 mmol/L 4.2  4.1  3.6   Chloride 98 - 111 mmol/L 108  106  107   CO2 22 - 32 mmol/L 26  28  24    Calcium 8.9 - 10.3 mg/dL 9.2  9.1  9.2     Past Medical History:  Diagnosis Date   CHF (congestive heart failure) (HCC)    Chronic kidney disease    Crohn's disease ([place])    Hypertension    Proteinuria     ASSESSMENT 51 year old male who presents to the HF clinic for follow up. Recently started [facility], which he has now started taking.  Sbps 130s and is not waking up with symptoms of hypertension at night. Not forgetting nighttime doses of carvedilol or Entresto. Will soon lose insurance  (mid-July 2023). Possibly not taking furosemide. There are two prescriptions that he has at home, but could not remember the names- these could possibly be either furosemide, pantoprazole, or isosorbide mononitrate. Confirmed patient is on carvedilol 12.5 mg BID, Entresto 49-51 BID, and Farxiga 10 mg daily as part of GDMT.  Recent ED Visit (past 6 months): Date - 10/20/21, CC - NSTEMI  PLAN Evening blood pressures have improved at home after initiation of Entresto 49-51 mg BID. Continue current GDMT. Advised to call Medication Management in the coming weeks to inquire if he would qualify for their services.  Plans to find medications in question (as above), when he gets home this afternoon. Advised to call clinic or message on mychart with names of these unidentified medications.  Addendum: in afternoon of 6/14, patient sent photos of medications via MyChart- including spironolactone and furosemide. Followed up with patient via phone call- at which time patient noted that he found new prescriptions for pantoprazole and isosorbide mononitrate as well. Avoid spironolactone due to previous instances of hyperkalemia per cardiology 6/8. Advised patient to avoid spironolactone and provided instructions for disposal (I.e., place in waste bin with cat litter). Continue furosemide every other day and isosorbide mononitrate every day. Advised to place these medications with his other maintenance medications, and to bring  them at future visits. Advised patient to continue famotidine over the counter and that pantoprazole may be continued as well. Noted side effects to pantoprazole.  Time spent: 15 minutes  [name], PharmD Pharmacy Resident  10/29/2021 11:06 AM  Current Outpatient Medications:    acetaminophen (TYLENOL) 325 MG tablet, Take 650 mg by mouth at bedtime., Disp: , Rfl:    aspirin 81 MG chewable tablet, Chew 1 tablet (81 mg total) by mouth daily., Disp: 30 tablet, Rfl: 0    atorvastatin (LIPITOR) 80 MG tablet, TAKE 1 TABLET BY MOUTH EVERY EVENING, Disp: 30 tablet, Rfl: 0   carvedilol (COREG) 12.5 MG tablet, Take 1 tablet (12.5 mg total) by mouth 2 (two) times daily with a meal., Disp: 60 tablet, Rfl: 0   clopidogrel (PLAVIX) 75 MG tablet, Take 1 tablet (75 mg total) by mouth daily., Disp: 90 tablet, Rfl: 0   ezetimibe (ZETIA) 10 MG tablet, Take 1 tablet (10 mg total) by mouth daily., Disp: 30 tablet, Rfl: 0   famotidine (PEPCID) 10 MG tablet, Take 10 mg by mouth at bedtime., Disp: , Rfl:    FARXIGA 10 MG TABS tablet, TAKE 1 TABLET BY MOUTH EVERY DAY, Disp: 30 tablet, Rfl: 0   furosemide (LASIX) 20 MG tablet, Take 1 tablet (20 mg total) by mouth every other day. (Patient not taking: Reported on 10/29/2021), Disp: 15 tablet, Rfl: 5   ibuprofen (ADVIL) 200 MG tablet, Take 200 mg by mouth at bedtime., Disp: , Rfl:    isosorbide mononitrate (IMDUR) 30 MG 24 hr tablet, Take 1 tablet (30 mg total) by mouth daily. (Patient not taking: Reported on 10/29/2021), Disp: 30 tablet, Rfl: 0   ondansetron (ZOFRAN-ODT) 4 MG disintegrating tablet, Take 4 mg by mouth every 8 (eight) hours as needed for nausea or vomiting., Disp: , Rfl:    pantoprazole (PROTONIX) 40 MG tablet, Take 1 tablet (40 mg total) by mouth daily. (Patient not taking: Reported on 10/29/2021), Disp: 30 tablet, Rfl: 0   sacubitril-valsartan (ENTRESTO) 49-51 MG, Take 1 tablet by mouth 2 (two) times daily., Disp: 60 tablet, Rfl: 0   COUNSELING POINTS/CLINICAL PEARLS   DRUGS TO CAUTION IN HEART FAILURE  Drug or Class Mechanism  Analgesics NSAIDs COX-2 inhibitors Glucocorticoids  Sodium and water retention, increased systemic vascular resistance, decreased response to diuretics   Diabetes Medications Metformin Thiazolidinediones Rosiglitazone (Avandia) Pioglitazone (Actos) DPP4 Inhibitors Saxagliptin (Onglyza) Sitagliptin (Januvia)   Lactic acidosis Possible calcium channel blockade   Unknown   Antiarrhythmics Class I  Flecainide Disopyramide Class III Sotalol Other Dronedarone  Negative inotrope, proarrhythmic   Proarrhythmic, beta blockade  Negative inotrope  Antihypertensives Alpha Blockers Doxazosin Calcium Channel Blockers Diltiazem Verapamil Nifedipine Central Alpha Adrenergics Moxonidine Peripheral Vasodilators Minoxidil  Increases renin and aldosterone  Negative inotrope    Possible sympathetic withdrawal  Unknown  Anti-infective Itraconazole Amphotericin B  Negative inotrope Unknown  Hematologic Anagrelide Cilostazol   Possible inhibition of PD IV Inhibition of PD III causing arrhythmias  Neurologic/Psychiatric Stimulants Anti-Seizure Drugs Carbamazepine Pregabalin Antidepressants Tricyclics Citalopram Parkinsons Bromocriptine Pergolide Pramipexole Antipsychotics Clozapine Antimigraine Ergotamine Methysergide Appetite suppressants Bipolar Lithium  Peripheral alpha and beta agonist activity  Negative inotrope and chronotrope Calcium channel blockade  Negative inotrope, proarrhythmic Dose-dependent QT prolongation  Excessive serotonin activity/valvular damage Excessive serotonin activity/valvular damage Unknown  IgE mediated hypersensitivy, calcium channel blockade  Excessive serotonin activity/valvular damage Excessive serotonin activity/valvular damage Valvular damage  Direct myofibrillar degeneration, adrenergic stimulation  Antimalarials Chloroquine Hydroxychloroquine Intracellular inhibition of lysosomal enzymes  Urologic Agents Alpha Blockers Doxazosin Prazosin Tamsulosin Terazosin  Increased renin and aldosterone  Adapted from Page Carleene Overlie, et al. "Drugs That May Cause or Exacerbate Heart Failure: A Scientific Statement from the American Heart  Association." Circulation 2016; 415:J16-P44. DOI: 10.1161/CIR.0000000000000426   MEDICATION ADHERENCES TIPS AND STRATEGIES Taking medication as prescribed  improves patient outcomes in heart failure (reduces hospitalizations, improves symptoms, increases survival) Side effects of medications can be managed by decreasing doses, switching agents, stopping drugs, or adding additional therapy. Please let someone in the [facility] know if you have having bothersome side effects so we can modify your regimen. Do not alter your medication regimen without talking to [place].  Medication reminders can help patients remember to take drugs on time. If you are missing or forgetting doses you can try linking behaviors, using pill boxes, or an electronic reminder like an alarm on your phone or an app. Some people can also get automated phone calls as medication reminders.

## 2021-10-30 ENCOUNTER — Telehealth: Payer: Self-pay

## 2021-10-30 NOTE — Telephone Encounter (Signed)
Lm for patient to see if he has had a previous sleep study.

## 2021-10-31 ENCOUNTER — Ambulatory Visit (INDEPENDENT_AMBULATORY_CARE_PROVIDER_SITE_OTHER): Payer: Managed Care, Other (non HMO) | Admitting: Primary Care

## 2021-10-31 ENCOUNTER — Encounter: Payer: Self-pay | Admitting: Primary Care

## 2021-10-31 VITALS — BP 124/76 | HR 62 | Temp 97.7°F | Ht 70.0 in | Wt 217.6 lb

## 2021-10-31 DIAGNOSIS — I5022 Chronic systolic (congestive) heart failure: Secondary | ICD-10-CM

## 2021-10-31 DIAGNOSIS — I214 Non-ST elevation (NSTEMI) myocardial infarction: Secondary | ICD-10-CM

## 2021-10-31 DIAGNOSIS — G4719 Other hypersomnia: Secondary | ICD-10-CM | POA: Diagnosis not present

## 2021-10-31 NOTE — Assessment & Plan Note (Signed)
-  Patient has symptoms of loud snoring, restless sleep and daytime sleepiness.  Epworth 17. BMI 31. Strong suspicion patient could have obstructive sleep apnea, needs in-lab sleep study to evaluate d/t hx recent NSTEMI and heart failure. Discussed risk of untreated sleep apnea including cardiac arrhythmias, pulm HTN, stroke, DM. We briefly reviewed treatment options. Encouraged patient to work on weight loss efforts and focus on side sleeping position/elevate head of bed. Advised against driving if experiencing excessive daytime sleepiness. Follow-up in 6 weeks to review sleep study results and discuss treatment options further

## 2021-10-31 NOTE — Progress Notes (Addendum)
_0  ID: Clarence [name], male    DOB: 1970/06/18, 51 y.o.   MRN: [id number]  Chief Complaint  Patient presents with   sleep consult    C/o daytime sleepiness, restless sleep and loud snoring.     Referring provider: Sindy Messing  HPI: 51 year old male, former smoker quit in March 2023.  Medical history significant for hypertension, chronic heart failure with reduced ejection fraction, NSTEMI, pulmonary hypertension, atypical pneumonia, chronic kidney disease stage II, Crohn's disease, dyslipidemia.   10/31/2021 Patient presents today for sleep consult.  He has symptoms of loud snoring, restless sleep and daytime sleepiness.  He tells me that he can fall asleep without issues during the daytime but has difficulty falling asleep at night.  He is working on establishing an earlier bedtime.  He is somewhat of a night owl.  Typical bedtime is between 11 PM and 2 AM.  It can take him anywhere from between 20 and 60 minutes to fall asleep.  He wakes up several times at night but states that this is due to his Crohn's disease.  He starts his day between 9 and 10:30 AM.  No previous sleep studies.  He is not currently on CPAP or oxygen.  Epworth is 17.  Denies symptoms of narcolepsy, cataplexy or sleepwalking.  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  Do you currently wear CPAP- No Do you current wear oxygen- No Epworth- 17  Allergies  Allergen Reactions   Ibuprofen Other (See Comments) and Hives    Other reaction(s): GI Upset (intolerance) Hives    Neomycin-Bacitracin Zn-Polymyx     Other reaction(s): Other (See Comments)   Sulfacetamide Other (See Comments)    Immunization History   Administered Date(s) Administered   Moderna SARS-COV2 Booster Vaccination 04/21/2020, 01/07/2021   Zoster Recombinat (Shingrix) 01/07/2021    Past Medical History:  Diagnosis Date   CHF (congestive heart failure) (HCC)    Chronic kidney disease    Crohn's disease ([place]    Hypertension    Proteinuria     Tobacco History: Social History   Tobacco Use  Smoking Status Former   Types: Cigars   Quit date: 08/13/2021   Years since quitting: 0.2  Smokeless Tobacco Not on file   Counseling given: Not Answered   Outpatient Medications Prior to Visit  Medication Sig Dispense Refill   acetaminophen (TYLENOL) 325 MG tablet Take 650 mg by mouth at bedtime.     aspirin 81 MG chewable tablet Chew 1 tablet (81 mg total) by mouth daily. 30 tablet 0   atorvastatin (LIPITOR) 80 MG tablet TAKE 1 TABLET BY MOUTH EVERY EVENING 30 tablet 0   carvedilol (COREG) 12.5 MG tablet Take 1 tablet (12.5 mg total) by mouth 2 (two) times daily with a meal. 60 tablet 0   clopidogrel (PLAVIX) 75 MG tablet Take 1 tablet (75 mg total) by mouth daily. 90 tablet 0   ezetimibe (ZETIA) 10 MG tablet Take 1 tablet (10 mg total) by mouth daily. 30 tablet 0   famotidine (PEPCID) 10 MG tablet Take 10 mg by mouth at bedtime.     FARXIGA 10 MG TABS tablet TAKE 1 TABLET BY MOUTH EVERY DAY 30 tablet 0   furosemide (LASIX) 20 MG  tablet Take 1 tablet (20 mg total) by mouth every other day. 15 tablet 5   losartan (COZAAR) 25 MG tablet Take 25 mg by mouth daily.     ondansetron (ZOFRAN-ODT) 4 MG disintegrating tablet Take 4 mg by mouth every 8 (eight) hours as needed for nausea or vomiting.     pantoprazole (PROTONIX) 40 MG tablet Take 1 tablet (40 mg total) by mouth daily. 30 tablet 0   sacubitril-valsartan (ENTRESTO) 49-51 MG Take 1 tablet by mouth 2 (two) times daily. 60 tablet 0   ibuprofen (ADVIL) 200 MG tablet Take 200 mg by mouth at bedtime.     isosorbide mononitrate (IMDUR) 30 MG 24 hr tablet Take 1 tablet (30 mg  total) by mouth daily. (Patient not taking: Reported on 10/31/2021) 30 tablet 0   No facility-administered medications prior to visit.   Review of Systems  Review of Systems  Constitutional:  Positive for fatigue.  HENT: Negative.    Respiratory: Negative.    Cardiovascular: Negative.   Psychiatric/Behavioral:  Positive for sleep disturbance.    Physical Exam  BP 124/76 (BP Location: Left Arm, Cuff Size: Normal)   Pulse 62   Temp 97.7 F (36.5 C) (Temporal)   Ht _0  (1.778 m)   Wt 217 lb 9.6 oz (98.7 kg)   SpO2 100%   BMI 31.22 kg/m  Physical Exam Constitutional:      Appearance: Normal appearance.  HENT:     Head: Normocephalic and atraumatic.     Mouth/Throat:     Mouth: Mucous membranes are moist.     Pharynx: Oropharynx is clear.  Cardiovascular:     Rate and Rhythm: Normal rate and regular rhythm.  Pulmonary:     Effort: Pulmonary effort is normal.     Breath sounds: Normal breath sounds.  Musculoskeletal:        General: Normal range of motion.     Cervical back: Normal range of motion and neck supple.  Skin:    General: Skin is warm and dry.  Neurological:     General: No focal deficit present.     Mental Status: He is alert and oriented to person, place, and time. Mental status is at baseline.  Psychiatric:        Mood and Affect: Mood normal.        Behavior: Behavior normal.        Thought Content: Thought content normal.        Judgment: Judgment normal.      Lab Results:  CBC    Component Value Date/Time   WBC 8.0 10/21/2021 0545   RBC 4.25 10/21/2021 0545   HGB 12.0 (L) 10/21/2021 0545   HCT 34.9 (L) 10/21/2021 0545   PLT 159 10/21/2021 0545   MCV 82.1 10/21/2021 0545   MCH 28.2 10/21/2021 0545   MCHC 34.4 10/21/2021 0545   RDW 14.6 10/21/2021 0545   LYMPHSABS 1.1 10/14/2021 1057   MONOABS 0.7 10/14/2021 1057   EOSABS 0.4 10/14/2021 1057   BASOSABS 0.0 10/14/2021 1057    BMET    Component Value Date/Time   NA 140 10/23/2021  0533   K 4.2 10/23/2021 0533   CL 108 10/23/2021 0533   CO2 26 10/23/2021 0533   GLUCOSE 106 (H) 10/23/2021 0533   BUN 20 10/23/2021 0533   CREATININE 1.16 10/23/2021 0533   CALCIUM 9.2 10/23/2021 0533   GFRNONAA >60 10/23/2021 0533   GFRAA  06/11/2010 1440    >60  The eGFR has been calculated using the MDRD equation. This calculation has not been validated in all clinical situations. eGFR's persistently <60 mL/min signify possible Chronic Kidney Disease.    BNP    Component Value Date/Time   BNP 300.6 (H) 10/20/2021 1945    ProBNP No results found for: "PROBNP"  Imaging: DG Chest 2 View  Result Date: 10/20/2021 CLINICAL DATA:  Chest pain and shortness of breath. EXAM: CHEST - 2 VIEW COMPARISON:  Oct 06, 2021. FINDINGS: The heart size and mediastinal contours are within normal limits. There is no evidence of acute infiltrate, pleural effusion or pneumothorax. The visualized skeletal structures are unremarkable. IMPRESSION: No active cardiopulmonary disease. Electronically Signed   By: Virgina Norfolk M.D.   On: 10/20/2021 20:19   CT Angio Abd/Pel w/ and/or w/o  Result Date: 10/10/2021 CLINICAL DATA:  Renal artery stenosis, hypertensive emergency EXAM: CTA ABDOMEN AND PELVIS WITHOUT AND WITH CONTRAST TECHNIQUE: Multidetector CT imaging of the abdomen and pelvis was performed using the standard protocol during bolus administration of intravenous contrast. Multiplanar reconstructed images and MIPs were obtained and reviewed to evaluate the vascular anatomy. RADIATION DOSE REDUCTION: This exam was performed according to the departmental dose-optimization program which includes automated exposure control, adjustment of the mA and/or kV according to patient size and/or use of iterative reconstruction technique. CONTRAST:  186m OMNIPAQUE IOHEXOL 350 MG/ML SOLN COMPARISON:  Renal artery duplex ultrasound 10/08/2021; CT scan of the abdomen and pelvis 02/06/2020 FINDINGS: VASCULAR  Aorta: Normal caliber aorta without aneurysm, dissection, vasculitis or significant stenosis. Celiac: Patent without evidence of aneurysm, dissection, vasculitis or significant stenosis. SMA: Patent without evidence of aneurysm, dissection, vasculitis or significant stenosis. Renals: Both renal arteries are patent without evidence of aneurysm, dissection, vasculitis, fibromuscular dysplasia or significant stenosis. IMA: Patent without evidence of aneurysm, dissection, vasculitis or significant stenosis. Inflow: Patent without evidence of aneurysm, dissection, vasculitis or significant stenosis. Proximal Outflow: Bilateral common femoral and visualized portions of the superficial and profunda femoral arteries are patent without evidence of aneurysm, dissection, vasculitis or significant stenosis. Veins: No focal venous abnormality. Review of the MIP images confirms the above findings. NON-VASCULAR Lower chest: No acute abnormality. Hepatobiliary: No focal liver abnormality is seen. No gallstones, gallbladder wall thickening, or biliary dilatation. Pancreas: Unremarkable. No pancreatic ductal dilatation or surrounding inflammatory changes. Spleen: Normal in size without focal abnormality. Adrenals/Urinary Tract: Adrenal glands are unremarkable. Kidneys are normal, without renal calculi, focal solid lesion, or hydronephrosis. Bladder is unremarkable. Small low-attenuation lesions in the lower pole of the right kidney demonstrate no interval change compared to prior imaging from September of 2021. While too small for accurate characterization these almost certainly represent benign cysts and no further follow-up is recommended. 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 There is some fecalization of the bowel contents. No evidence of bowel obstruction. Lymphatic: No  suspicious lymphadenopathy. Reproductive: Prostate is unremarkable. Other: No ascites. Musculoskeletal: No acute or significant osseous findings. L5-S1 degenerative disc disease. IMPRESSION: VASCULAR 1. CT arteriography imaging demonstrates divergent findings from the prior renal artery ultrasound. There are only single renal arteries bilaterally with no evidence of stenosis, fibromuscular dysplasia or other abnormality. NON-VASCULAR 1. No acute abnormality within the abdomen or pelvis. 2. 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 Findings  are similar compared to prior imaging from September of 2021. 3. L5-S1 degenerative disc disease. Signed, Criselda Peaches, MD, [facility] Radiology Electronically Signed   By: Jacqulynn Cadet M.D.   On: 10/10/2021 15:46   US RENAL ARTERY DUPLEX COMPLETE  Result Date: 10/08/2021 CLINICAL DATA:  51 year old male with hypertensive emergency EXAM: RENAL/URINARY TRACT ULTRASOUND RENAL DUPLEX DOPPLER ULTRASOUND COMPARISON:  None Available. FINDINGS: Right Kidney: Length: 10.1 x 5.4 x 5.8 cm for a total volume of 165 mL. Echogenicity within normal limits. No mass or hydronephrosis visualized. Left Kidney: Length: 10.7 x 4.9 x 4.7 cm for a total volume of 128 mL. Echogenicity within normal limits. No mass or hydronephrosis visualized. Bladder: RENAL DUPLEX ULTRASOUND Right Renal Artery Velocities: (There are 2 right-sided renal arteries; the highest peak systolic velocity will be recorded) Origin:  227 cm/sec Mid:  143 cm/sec Hilum:  80 cm/sec Interlobar:  41 cm/sec Arcuate:  22 cm/sec Left Renal Artery Velocities: Origin:  135 cm/sec Mid:  129 cm/sec Hilum:  100 cm/sec Interlobar:  33 cm/sec Arcuate:  28 cm/sec Aortic Velocity:  88 cm/sec Right  Renal-Aortic Ratios: Origin: 2.6 Mid:  1.6 Hilum: 0.9 Interlobar: 0.5 Arcuate: 0.3 Left Renal-Aortic Ratios: Origin: 1.5 Mid: 1.5 Hilum: 1.1 Interlobar: 0.4 Arcuate: 0.3 IMPRESSION: 1. Two right-sided renal arteries, single left-sided renal artery. 2. Elevated peak systolic velocity greater than 200 centimeters/second in 1 of the 2 right renal arteries raises possibility of underlying stenosis. CT or MR arteriogram of the abdomen could further evaluate. 3. No evidence of left renal artery stenosis. 4. Normal sonographic appearance of the kidneys without evidence of hydronephrosis. Signed, Criselda Peaches, MD, [place] [facility] Radiology Electronically Signed   By: Jacqulynn Cadet M.D.   On: 10/08/2021 16:11   ECHOCARDIOGRAM COMPLETE  Result Date: 10/07/2021    ECHOCARDIOGRAM REPORT   Patient Name:   CHIMAOBI CASEBOLT [facility] Date of Exam: 10/07/2021 Medical Rec #:  [id number]         Height:       70.0 in Accession #:    [id number]        Weight:       216.0 lb Date of Birth:  1970/08/10          BSA:          2.157 m Patient Age:    76 years          BP:           140/97 mmHg Patient Gender: M                 HR:           75 bpm. Exam Location:  [facility] Procedure: 2D Echo, Cardiac Doppler and Color Doppler Indications:     Elevated troponin  History:         Patient has prior history of Echocardiogram examinations, most                  recent 08/05/2021. CHF and Cardiomyopathy, Cardiac                  catheterization 08/07/21; Risk Factors:Former Smoker,                  Hypertension and Dyslipidemia.  Sonographer:     Rosalia Hammers Referring Phys:  [id number] [name] A MANSY Diagnosing Phys: Nelva Bush MD IMPRESSIONS  1. Left ventricular ejection fraction, by estimation, is  35 to 40%. The left ventricle has moderately decreased function. The left ventricle demonstrates global hypokinesis. There is moderate left ventricular hypertrophy. Left ventricular diastolic  parameters are consistent with Grade I diastolic dysfunction (impaired relaxation).  2. Right ventricular systolic function is normal. The right ventricular size is normal.  3. Right atrial size was mildly dilated.  4. The mitral valve is normal in structure. Mild mitral valve regurgitation. No evidence of mitral stenosis.  5. The aortic valve is tricuspid. Aortic valve regurgitation is not visualized. No aortic stenosis is present.  6. There is mild dilatation of the ascending aorta, measuring 40 mm. FINDINGS  Left Ventricle: Left ventricular ejection fraction, by estimation, is 35 to 40%. The left ventricle has moderately decreased function. The left ventricle demonstrates global hypokinesis. The left ventricular internal cavity size was normal in size. There is moderate left ventricular hypertrophy. Left ventricular diastolic parameters are consistent with Grade I diastolic dysfunction (impaired relaxation).  LV Wall Scoring: The entire inferior wall and posterior wall are akinetic. The entire anterior wall, antero-lateral wall, entire septum, apical lateral segment, and apex are hypokinetic. Right Ventricle: The right ventricular size is normal. No increase in right ventricular wall thickness. Right ventricular systolic function is normal. Left Atrium: Left atrial size was normal in size. Right Atrium: Right atrial size was mildly dilated. Pericardium: There is no evidence of pericardial effusion. Mitral Valve: The mitral valve is normal in structure. Mild mitral valve regurgitation. No evidence of mitral valve stenosis. MV peak gradient, 4.8 mmHg. The mean mitral valve gradient is 2.0 mmHg. Tricuspid Valve: The tricuspid valve is normal in structure. Tricuspid valve regurgitation is trivial. Aortic Valve: The aortic valve is tricuspid. Aortic valve regurgitation is not visualized. No aortic stenosis is present. Aortic valve mean gradient measures 3.0 mmHg. Aortic valve peak gradient measures 4.8 mmHg. Aortic  valve area, by VTI measures 3.17 cm. Pulmonic Valve: The pulmonic valve was normal in structure. Pulmonic valve regurgitation is not visualized. No evidence of pulmonic stenosis. Aorta: The aortic root is normal in size and structure. There is mild dilatation of the ascending aorta, measuring 40 mm. Pulmonary Artery: The pulmonary artery is of normal size. IAS/Shunts: The interatrial septum was not well visualized.  LEFT VENTRICLE PLAX 2D LVIDd:         5.24 cm LVIDs:         4.44 cm LV PW:         1.46 cm LV IVS:        1.33 cm LVOT diam:     2.10 cm LV SV:         59 LV SV Index:   27 LVOT Area:     3.46 cm  RIGHT VENTRICLE RV Basal diam:  2.90 cm LEFT ATRIUM             Index        RIGHT ATRIUM           Index LA diam:        3.60 cm 1.67 cm/m   RA Area:     19.10 cm LA Vol (A2C):   64.8 ml 30.04 ml/m  RA Volume:   53.40 ml  24.76 ml/m LA Vol (A4C):   53.5 ml 24.80 ml/m LA Biplane Vol: 61.0 ml 28.28 ml/m  AORTIC VALVE                    PULMONIC VALVE AV Area (Vmax):    2.76 cm  PV Vmax:       0.90 m/s AV Area (Vmean):   2.19 cm     PV Vmean:      63.400 cm/s AV Area (VTI):     3.17 cm     PV VTI:        0.161 m AV Vmax:           110.00 cm/s  PV Peak grad:  3.3 mmHg AV Vmean:          84.800 cm/s  PV Mean grad:  2.0 mmHg AV VTI:            0.186 m AV Peak Grad:      4.8 mmHg AV Mean Grad:      3.0 mmHg LVOT Vmax:         87.50 cm/s LVOT Vmean:        53.700 cm/s LVOT VTI:          0.170 m LVOT/AV VTI ratio: 0.91  AORTA Ao Root diam: 3.70 cm MITRAL VALVE MV Area (PHT): 4.29 cm    SHUNTS MV Area VTI:   2.84 cm    Systemic VTI:  0.17 m MV Peak grad:  4.8 mmHg    Systemic Diam: 2.10 cm MV Mean grad:  2.0 mmHg MV Vmax:       1.10 m/s MV Vmean:      53.2 cm/s MV Decel Time: 177 msec MV E velocity: 45.40 cm/s MV A velocity: 85.30 cm/s MV E/A ratio:  0.53 Harrell Gave End MD Electronically signed by Nelva Bush MD Signature Date/Time: 10/07/2021/10:42:48 AM    Final    DG Chest 2 View  Result  Date: 10/06/2021 CLINICAL DATA:  Chest pain. EXAM: CHEST - 2 VIEW COMPARISON:  Chest radiograph dated September 14, 2021 FINDINGS: The heart size and mediastinal contours are within normal limits. Both lungs are clear. The visualized skeletal structures are unremarkable. IMPRESSION: No active cardiopulmonary disease. Electronically Signed   By: Keane Police D.O.   On: 10/06/2021 20:51     Assessment & Plan:   Excessive daytime sleepiness -Patient has symptoms of loud snoring, restless sleep and daytime sleepiness.  Epworth 17. BMI 31. Strong suspicion patient could have obstructive sleep apnea, needs in-lab sleep study to evaluate d/t hx recent NSTEMI and heart failure. Discussed risk of untreated sleep apnea including cardiac arrhythmias, pulm HTN, stroke, DM. We briefly reviewed treatment options. Encouraged patient to work on weight loss efforts and focus on side sleeping position/elevate head of bed. Advised against driving if experiencing excessive daytime sleepiness. Follow-up in 6 weeks to review sleep study results and discuss treatment options further  Chronic HFrEF (heart failure with reduced ejection fraction) ([place]) - Following with cardiology. Maintained on Entresto, Farxiga and Furosemide.  NSTEMI (non-ST elevated myocardial infarction) ([place]) - Admitted on 6/5//23 for chest pain and sob. Felt to have demand ischemia, possible NSTEMI. Trop peak 330. Originally on heparin drip. Cards recommended medical management. Started on [place] for HTN.    Martyn Ehrich, NP 10/31/2021

## 2021-10-31 NOTE — Assessment & Plan Note (Addendum)
-   Admitted on 6/5//23 for chest pain and sob. Felt to have demand ischemia, possible NSTEMI. Trop peak 330. Originally on heparin drip. Cards recommended medical management. Started on [place] for HTN.

## 2021-10-31 NOTE — Patient Instructions (Addendum)
  Sleep apnea is defined as period of 10 seconds or longer when you stop breathing at night. This can happen multiple times a night. Dx sleep apnea is when this occurs more than 5 times an hour.    Mild OSA 5-15 apneic events an hour Moderate OSA 15-30 apneic events an hour Severe OSA > 30 apneic events an hour   Untreated sleep apnea puts you at higher risk for cardiac arrhythmias, pulmonary HTN, stroke and diabetes   Treatment options include weight loss, side sleeping position, oral appliance, CPAP therapy or referral to ENT for possible surgical options    Recommendations: - Focus on side sleeping position or elevate head of bed 30 degrees while sleeping - Avoid sedating medication or excessive alcohol use prior to bedtime as these can worsen underlying sleep apnea - Do not drive if experiencing excessive daytime sleepiness of fatigue    Orders: - In lab sleep study re: loud snoring    Follow-up: - 2 month follow-up with Eustaquio Maize NP

## 2021-10-31 NOTE — Telephone Encounter (Signed)
Spoke to patient.  He stated that he has not had previous sleep study.  Will close encounter.

## 2021-10-31 NOTE — Assessment & Plan Note (Signed)
-   Following with cardiology. Maintained on Entresto, Farxiga and Furosemide.

## 2021-11-03 ENCOUNTER — Ambulatory Visit
Admission: RE | Admit: 2021-11-03 | Discharge: 2021-11-03 | Disposition: A | Discharge status: Home / Self Care | Payer: Managed Care, Other (non HMO) | Source: Ambulatory Visit | Attending: Physician Assistant | Admitting: Physician Assistant

## 2021-11-03 DIAGNOSIS — R0600 Dyspnea, unspecified: Secondary | ICD-10-CM | POA: Diagnosis present

## 2021-11-03 DIAGNOSIS — R161 Splenomegaly, not elsewhere classified: Secondary | ICD-10-CM | POA: Diagnosis not present

## 2021-11-03 DIAGNOSIS — I77810 Thoracic aortic ectasia: Secondary | ICD-10-CM | POA: Insufficient documentation

## 2021-11-03 DIAGNOSIS — I2510 Atherosclerotic heart disease of native coronary artery without angina pectoris: Secondary | ICD-10-CM | POA: Diagnosis not present

## 2021-11-03 NOTE — Progress Notes (Signed)
Reviewed and agree with assessment/plan.   Chesley Mires, MD [facility] Pulmonary/Critical Care 11/03/2021, 8:57 AM Pager:  [phone]

## 2021-11-04 ENCOUNTER — Telehealth: Payer: Self-pay | Admitting: *Deleted

## 2021-11-04 ENCOUNTER — Other Ambulatory Visit: Payer: Self-pay | Admitting: *Deleted

## 2021-11-04 DIAGNOSIS — R911 Solitary pulmonary nodule: Secondary | ICD-10-CM

## 2021-11-04 DIAGNOSIS — I700 Atherosclerosis of aorta: Secondary | ICD-10-CM

## 2021-11-04 MED ORDER — ISOSORBIDE MONONITRATE ER 30 MG PO TB24: 30.0000 mg | ORAL_TABLET | Freq: Every day | ORAL | 3 refills | Status: DC

## 2021-11-04 NOTE — Telephone Encounter (Signed)
Reviewed results and recommendations with patient. Orders entered for repeat testing and patient had no further questions at this time.

## 2021-11-04 NOTE — Telephone Encounter (Signed)
-----   Message from Clarence [name], [place] sent at 11/03/2021  3:54 PM EDT ----- Please inform the patient his CT of the chest showed coronary artery calcification (we already knew this based on cardiac cath) as well as some dilation of the ascending thoracic aorta measuring 4 cm.  Previously noted pulmonary nodules are without significant interval change.  An enlarged spleen was also incidentally noted (also noted on prior imaging).  Recommendations: -Follow-up CTA of the aorta in 12 months -Follow-up chest CT in 12 months for pulmonary nodular densities -He can discussed splenomegaly with his PCP at his next visit

## 2021-11-05 ENCOUNTER — Other Ambulatory Visit: Payer: Managed Care, Other (non HMO)

## 2021-11-05 ENCOUNTER — Ambulatory Visit: Payer: Self-pay | Admitting: Nurse Practitioner

## 2021-11-05 ENCOUNTER — Other Ambulatory Visit: Payer: Self-pay | Admitting: Family

## 2021-11-05 MED ORDER — FUROSEMIDE 20 MG PO TABS: 20.0000 mg | ORAL_TABLET | Freq: Every day | ORAL | 5 refills | Status: DC

## 2021-11-06 ENCOUNTER — Telehealth: Payer: Self-pay | Admitting: Cardiovascular Disease

## 2021-11-06 ENCOUNTER — Telehealth: Payer: Self-pay | Admitting: Family

## 2021-11-06 NOTE — Telephone Encounter (Signed)
Called to follow up and see what this could be in reference to. She states authorization for CT Chest without contrast was denied. Reviewed that based on documentation this testing is ordered for 1 year follow up imaging. So not needed at this time but in one year. Advised that I would route this to our authorization team for review and follow up.

## 2021-11-06 NOTE — Telephone Encounter (Signed)
Denise from [facility] called to say the request form Dr Darene Lamer. Rockey Situ has not been approved. A fax will been sent with this information as well.  If any question your can reached them at [phone], REF # [id number].

## 2021-11-06 NOTE — Telephone Encounter (Signed)
Called and LVM checking on patient aft Clarence [name] received a message through my chart that his blood pressure is high. Clarence [name] responded to patient through my chart telling patient to increase his furosemide to once daily instead of every other day and if high blood pressure continues to reach out to [facility] heart care for an appointment as Clarence [name] is out of town until 6/26. I LVM with patient checking in and asked him to reach back out if needed.   Jarick Harkins, NT

## 2021-11-07 ENCOUNTER — Telehealth: Payer: Self-pay | Admitting: *Deleted

## 2021-11-10 ENCOUNTER — Encounter: Payer: Self-pay | Admitting: Physician Assistant

## 2021-11-10 ENCOUNTER — Ambulatory Visit (INDEPENDENT_AMBULATORY_CARE_PROVIDER_SITE_OTHER): Payer: Managed Care, Other (non HMO) | Admitting: Physician Assistant

## 2021-11-10 VITALS — BP 110/80 | HR 59 | Ht 70.0 in | Wt 216.0 lb

## 2021-11-10 DIAGNOSIS — I2510 Atherosclerotic heart disease of native coronary artery without angina pectoris: Secondary | ICD-10-CM

## 2021-11-10 DIAGNOSIS — I428 Other cardiomyopathies: Secondary | ICD-10-CM | POA: Diagnosis not present

## 2021-11-10 DIAGNOSIS — N183 Chronic kidney disease, stage 3 unspecified: Secondary | ICD-10-CM

## 2021-11-10 DIAGNOSIS — R911 Solitary pulmonary nodule: Secondary | ICD-10-CM

## 2021-11-10 DIAGNOSIS — I77810 Thoracic aortic ectasia: Secondary | ICD-10-CM

## 2021-11-10 DIAGNOSIS — I255 Ischemic cardiomyopathy: Secondary | ICD-10-CM | POA: Diagnosis not present

## 2021-11-10 DIAGNOSIS — I5020 Unspecified systolic (congestive) heart failure: Secondary | ICD-10-CM | POA: Diagnosis not present

## 2021-11-10 DIAGNOSIS — E785 Hyperlipidemia, unspecified: Secondary | ICD-10-CM

## 2021-11-10 MED ORDER — ENTRESTO 97-103 MG PO TABS: 1.0000 | ORAL_TABLET | Freq: Two times a day (BID) | ORAL | 3 refills | Status: DC

## 2021-11-10 NOTE — Telephone Encounter (Signed)
Patient coming in to see provider today. Will address concerns and clarification of medications.

## 2021-11-11 ENCOUNTER — Telehealth: Payer: Self-pay | Admitting: Internal Medicine

## 2021-11-11 ENCOUNTER — Ambulatory Visit (HOSPITAL_COMMUNITY)
Admission: RE | Admit: 2021-11-11 | Discharge: 2021-11-11 | Disposition: A | Discharge status: Home / Self Care | Payer: Managed Care, Other (non HMO) | Source: Ambulatory Visit | Attending: Cardiology | Admitting: Cardiology

## 2021-11-11 VITALS — BP 119/60 | HR 55 | Wt 216.6 lb

## 2021-11-11 DIAGNOSIS — I5022 Chronic systolic (congestive) heart failure: Secondary | ICD-10-CM | POA: Diagnosis not present

## 2021-11-11 DIAGNOSIS — R0600 Dyspnea, unspecified: Secondary | ICD-10-CM | POA: Diagnosis not present

## 2021-11-11 DIAGNOSIS — K5090 Crohn's disease, unspecified, without complications: Secondary | ICD-10-CM | POA: Diagnosis not present

## 2021-11-11 DIAGNOSIS — N183 Chronic kidney disease, stage 3 unspecified: Secondary | ICD-10-CM | POA: Diagnosis not present

## 2021-11-11 DIAGNOSIS — I252 Old myocardial infarction: Secondary | ICD-10-CM | POA: Diagnosis not present

## 2021-11-11 DIAGNOSIS — I25118 Atherosclerotic heart disease of native coronary artery with other forms of angina pectoris: Secondary | ICD-10-CM | POA: Diagnosis not present

## 2021-11-11 DIAGNOSIS — Z7902 Long term (current) use of antithrombotics/antiplatelets: Secondary | ICD-10-CM | POA: Insufficient documentation

## 2021-11-11 DIAGNOSIS — I5020 Unspecified systolic (congestive) heart failure: Secondary | ICD-10-CM

## 2021-11-11 DIAGNOSIS — Z79899 Other long term (current) drug therapy: Secondary | ICD-10-CM | POA: Insufficient documentation

## 2021-11-11 DIAGNOSIS — Z87891 Personal history of nicotine dependence: Secondary | ICD-10-CM | POA: Diagnosis not present

## 2021-11-11 DIAGNOSIS — I208 Other forms of angina pectoris: Secondary | ICD-10-CM

## 2021-11-11 DIAGNOSIS — E781 Pure hyperglyceridemia: Secondary | ICD-10-CM | POA: Diagnosis not present

## 2021-11-11 DIAGNOSIS — I130 Hypertensive heart and chronic kidney disease with heart failure and stage 1 through stage 4 chronic kidney disease, or unspecified chronic kidney disease: Secondary | ICD-10-CM | POA: Diagnosis not present

## 2021-11-11 DIAGNOSIS — I2089 Other forms of angina pectoris: Secondary | ICD-10-CM

## 2021-11-11 DIAGNOSIS — R0789 Other chest pain: Secondary | ICD-10-CM | POA: Insufficient documentation

## 2021-11-11 LAB — BASIC METABOLIC PANEL
Anion gap: 8 (ref 5–15)
BUN: 14 mg/dL (ref 6–20)
CO2: 22 mmol/L (ref 22–32)
Calcium: 9.3 mg/dL (ref 8.9–10.3)
Chloride: 111 mmol/L (ref 98–111)
Creatinine, Ser: 1.38 mg/dL — ABNORMAL HIGH (ref 0.61–1.24)
GFR, Estimated: >60 mL/min (ref >60)
Glucose, Bld: 124 mg/dL — ABNORMAL HIGH (ref 70–99)
Potassium: 4.3 mmol/L (ref 3.5–5.1)
Sodium: 141 mmol/L (ref 135–145)

## 2021-11-11 LAB — BRAIN NATRIURETIC PEPTIDE: B Natriuretic Peptide: 111.9 pg/mL — ABNORMAL HIGH (ref 0.0–100.0)

## 2021-11-11 MED ORDER — ISOSORBIDE MONONITRATE ER 60 MG PO TB24: 60.0000 mg | ORAL_TABLET | Freq: Every day | ORAL | 3 refills | Status: DC

## 2021-11-11 MED ORDER — ICOSAPENT ETHYL 1 G PO CAPS: 2.0000 g | ORAL_CAPSULE | Freq: Two times a day (BID) | ORAL | 3 refills | Status: DC

## 2021-11-11 MED ORDER — SPIRONOLACTONE 25 MG PO TABS: 12.5000 mg | ORAL_TABLET | Freq: Every day | ORAL | 3 refills | Status: DC

## 2021-11-11 MED ORDER — FUROSEMIDE 20 MG PO TABS: 40.0000 mg | ORAL_TABLET | Freq: Every day | ORAL | 5 refills | Status: DC

## 2021-11-12 ENCOUNTER — Telehealth (HOSPITAL_COMMUNITY): Payer: Self-pay | Admitting: Licensed Clinical Social Worker

## 2021-11-12 NOTE — Telephone Encounter (Signed)
CSW consulted to speak with pt about concerns with losing his insurance at the end of July.  CSW attempted to call pt to discuss- unable to reach- left VM requesting return call  Clarence [name], [facility] Desk#: [phone] Cell#: [phone]

## 2021-11-14 ENCOUNTER — Telehealth (HOSPITAL_COMMUNITY): Payer: Self-pay | Admitting: Licensed Clinical Social Worker

## 2021-11-14 NOTE — Telephone Encounter (Signed)
CSW consulted to speak with pt about concerns of losing his insurance.  Unable to reach pt- left VM requesting return call  Jorge Ny, [facility] Desk#: [phone] Cell#: [phone]

## 2021-11-17 ENCOUNTER — Telehealth (HOSPITAL_COMMUNITY): Payer: Self-pay | Admitting: Licensed Clinical Social Worker

## 2021-11-17 NOTE — Telephone Encounter (Signed)
CSW called pt to discuss insurance concerns.  Unable to reach-left VM requesting return call  Clarence [name], [place] [facility] Desk#: [phone] Cell#: [phone]

## 2021-11-19 ENCOUNTER — Other Ambulatory Visit: Payer: Self-pay | Admitting: *Deleted

## 2021-11-19 MED ORDER — EZETIMIBE 10 MG PO TABS
10.0000 mg | ORAL_TABLET | Freq: Every day | ORAL | 3 refills | Status: DC
  Filled 2022-06-08: qty 30, 30d supply, fill #0
  Filled 2022-07-07: qty 30, 30d supply, fill #1
  Filled 2022-08-01: qty 30, 30d supply, fill #2
  Filled 2022-09-01 (×2): qty 30, 30d supply, fill #3
  Filled 2022-10-13: qty 30, 30d supply, fill #4

## 2021-11-19 MED ORDER — DAPAGLIFLOZIN PROPANEDIOL 10 MG PO TABS: 10.0000 mg | ORAL_TABLET | Freq: Every day | ORAL | 3 refills | Status: DC

## 2021-11-19 MED ORDER — ATORVASTATIN CALCIUM 80 MG PO TABS: 80.0000 mg | ORAL_TABLET | Freq: Every evening | ORAL | 3 refills | Status: DC

## 2021-11-19 MED ORDER — CLOPIDOGREL BISULFATE 75 MG PO TABS: 75.0000 mg | ORAL_TABLET | Freq: Every day | ORAL | 3 refills | Status: DC

## 2021-11-26 ENCOUNTER — Other Ambulatory Visit (HOSPITAL_COMMUNITY): Payer: Self-pay | Admitting: *Deleted

## 2021-11-26 MED ORDER — FUROSEMIDE 20 MG PO TABS: 40.0000 mg | ORAL_TABLET | Freq: Every day | ORAL | 6 refills | Status: DC

## 2021-11-27 ENCOUNTER — Ambulatory Visit (INDEPENDENT_AMBULATORY_CARE_PROVIDER_SITE_OTHER): Payer: Managed Care, Other (non HMO) | Admitting: Nurse Practitioner

## 2021-11-27 ENCOUNTER — Encounter: Payer: Self-pay | Admitting: Nurse Practitioner

## 2021-11-27 VITALS — BP 112/75 | HR 60 | Temp 98.6°F | Ht 70.0 in | Wt 213.0 lb

## 2021-11-27 DIAGNOSIS — E785 Hyperlipidemia, unspecified: Secondary | ICD-10-CM | POA: Diagnosis not present

## 2021-11-27 DIAGNOSIS — I272 Pulmonary hypertension, unspecified: Secondary | ICD-10-CM | POA: Diagnosis not present

## 2021-11-27 DIAGNOSIS — I5022 Chronic systolic (congestive) heart failure: Secondary | ICD-10-CM

## 2021-11-27 DIAGNOSIS — Z7689 Persons encountering health services in other specified circumstances: Secondary | ICD-10-CM

## 2021-11-27 DIAGNOSIS — I255 Ischemic cardiomyopathy: Secondary | ICD-10-CM

## 2021-11-27 DIAGNOSIS — N182 Chronic kidney disease, stage 2 (mild): Secondary | ICD-10-CM

## 2021-11-27 DIAGNOSIS — R739 Hyperglycemia, unspecified: Secondary | ICD-10-CM

## 2021-11-27 DIAGNOSIS — K50918 Crohn's disease, unspecified, with other complication: Secondary | ICD-10-CM | POA: Diagnosis not present

## 2021-11-27 DIAGNOSIS — I10 Essential (primary) hypertension: Secondary | ICD-10-CM

## 2021-11-27 NOTE — Assessment & Plan Note (Signed)
Chronic. Continue to follow up with Cardiology and HF clinic.  - Reminded to call for an overnight weight gain of >2 pounds or a weekly weight gain of >5 pounds - not adding salt to food and read food labels. Reviewed the importance of keeping daily sodium intake to <2061m daily. - Avoid Ibuprofen products.

## 2021-11-27 NOTE — Assessment & Plan Note (Signed)
Chronic. Controlled in the office but running high at home when patient is up and moving. Per last HF note his Imdur was increased to 27m.  Patient states he is taking it.  He is also still taking Carvdeilol 12.590mBID.  Continue to follow up 2 weeks for reevaluation.

## 2021-11-27 NOTE — Progress Notes (Signed)
BP 112/75   Pulse 60   Temp 98.6 F (37 C) (Oral)   Ht 5' 10"  (1.778 m)   Wt 213 lb (96.6 kg)   SpO2 98%   BMI 30.56 kg/m    Subjective:    Patient ID: Clarence [name], male    DOB: [birth year], 51 y.o.   MRN: [id number]  HPI: Clarence [name] is a 51 y.o. male  Chief Complaint  Patient presents with   Establish Care   Patient presents to clinic to establish care with new PCP.  Introduced to Designer, jewellery role and practice setting.  All questions answered.  Discussed provider/patient relationship and expectations.  Patient reports a history of HTN, HF, NSTEMI x 2, CKD stage 2, hyperlipidemia, Chron's- has an ostomy bag.  Patient denies a history of: Hypertension, Elevated Cholesterol, Diabetes, Thyroid problems, Depression, Anxiety, Neurological problems, and Abdominal problems.   HYPERTENSION Hypertension status: controlled  Satisfied with current treatment? no Duration of hypertension: years BP monitoring frequency:  daily BP range: 160-200/80-100 BP medication side effects:  no Medication compliance: excellent compliance Previous BP meds:carvedilol and valsartan, Imdur, Lasix, and spirnolactone Aspirin: yes Recurrent headaches: no Visual changes: no Palpitations: no Dyspnea: yes Chest pain: no Lower extremity edema: no Dizzy/lightheaded: no  Active Ambulatory Problems    Diagnosis Date Noted   Essential hypertension 08/04/2021   Crohn's disease ([place]) 08/04/2021   Atypical pneumonia 08/05/2021   CKD (chronic kidney disease) stage 2, GFR 60-89 ml/min 08/05/2021   Hypertensive emergency 09/09/2021   Acute on chronic combined systolic and diastolic CHF (congestive heart failure) ([place]) 09/09/2021   Dyslipidemia 09/09/2021   Hypotension due to medication 09/15/2021   Hypertensive urgency 10/07/2021   Chronic HFrEF (heart failure with reduced ejection fraction) ([place]) 10/07/2021   AKI (acute kidney injury) ([place]) 10/08/2021   Ischemic cardiomyopathy  10/21/2021   Pulmonary hypertension ([place]) 10/21/2021   Demand ischemia , possible NSTEMI 10/21/2021   NSTEMI (non-ST elevated myocardial infarction) ([place]) 10/21/2021   Excessive daytime sleepiness 10/31/2021   Resolved Ambulatory Problems    Diagnosis Date Noted   CHF exacerbation ([place]) 08/04/2021   Past Medical History:  Diagnosis Date   CHF (congestive heart failure) ([place])    Chronic kidney disease    Hypertension    Proteinuria    Past Surgical History:  Procedure Laterality Date   COLECTOMY     RIGHT/LEFT HEART CATH AND CORONARY ANGIOGRAPHY N/A 08/07/2021   Procedure: RIGHT/LEFT HEART CATH AND CORONARY ANGIOGRAPHY;  Surgeon: Nelva Bush, MD;  Location: [facility];  Service: Cardiovascular;  Laterality: N/A;    Family History  Problem Relation Age of Onset   Cancer Maternal Aunt    Heart disease Maternal Uncle    Pancreatic cancer Maternal Uncle      Review of Systems  Respiratory:  Positive for shortness of breath and wheezing.     Per HPI unless specifically indicated above     Objective:    BP 112/75   Pulse 60   Temp 98.6 F (37 C) (Oral)   Ht 5' 10"  (1.778 m)   Wt 213 lb (96.6 kg)   SpO2 98%   BMI 30.56 kg/m   Wt Readings from Last 3 Encounters:  11/27/21 213 lb (96.6 kg)  11/11/21 216 lb 9.6 oz (98.2 kg)  11/10/21 216 lb (98 kg)    Physical Exam Vitals and nursing note reviewed.  Constitutional:      General: He is not in acute distress.  Appearance: Normal appearance. He is not ill-appearing, toxic-appearing or diaphoretic.  HENT:     Head: Normocephalic.     Right Ear: External ear normal.     Left Ear: External ear normal.     Nose: Nose normal. No congestion or rhinorrhea.     Mouth/Throat:     Mouth: Mucous membranes are moist.  Eyes:     General:        Right eye: No discharge.        Left eye: No discharge.     Extraocular Movements: Extraocular movements intact.     Conjunctiva/sclera: Conjunctivae normal.      Pupils: Pupils are equal, round, and reactive to light.  Cardiovascular:     Rate and Rhythm: Normal rate and regular rhythm.     Heart sounds: No murmur heard. Pulmonary:     Effort: Pulmonary effort is normal. No respiratory distress.     Breath sounds: Normal breath sounds. No wheezing, rhonchi or rales.  Abdominal:     General: Abdomen is flat. Bowel sounds are normal.  Musculoskeletal:     Cervical back: Normal range of motion and neck supple.  Skin:    General: Skin is warm and dry.     Capillary Refill: Capillary refill takes less than 2 seconds.  Neurological:     General: No focal deficit present.     Mental Status: He is alert and oriented to person, place, and time.  Psychiatric:        Mood and Affect: Mood normal.        Behavior: Behavior normal.        Thought Content: Thought content normal.        Judgment: Judgment normal.     Results for orders placed or performed during the hospital encounter of 05/19/70  Basic Metabolic Panel (BMET)  Result Value Ref Range   Sodium 141 135 - 145 mmol/L   Potassium 4.3 3.5 - 5.1 mmol/L   Chloride 111 98 - 111 mmol/L   CO2 22 22 - 32 mmol/L   Glucose, Bld 124 (H) 70 - 99 mg/dL   BUN 14 6 - 20 mg/dL   Creatinine, Ser 1.38 (H) 0.61 - 1.24 mg/dL   Calcium 9.3 8.9 - 10.3 mg/dL   GFR, Estimated >60 >60 mL/min   Anion gap 8 5 - 15  B Nat Peptide  Result Value Ref Range   B Natriuretic Peptide 111.9 (H) 0.0 - 100.0 pg/mL      Assessment & Plan:   Problem List Items Addressed This Visit       Cardiovascular and Mediastinum   Essential hypertension (Chronic)    Chronic. Controlled in the office but running high at home when patient is up and moving. Per last HF note his Imdur was increased to 42m.  Patient states he is taking it.  He is also still taking Carvdeilol 12.538mBID.  Continue to follow up 2 weeks for reevaluation.       Chronic HFrEF (heart failure with reduced ejection fraction) (HCC)    Chronic. Continue  to follow up with Cardiology and HF clinic.  - Reminded to call for an overnight weight gain of >2 pounds or a weekly weight gain of >5 pounds - not adding salt to food and read food labels. Reviewed the importance of keeping daily sodium intake to <200071maily. - Avoid Ibuprofen products.       Ischemic cardiomyopathy    Chronic. Continue to follow up  with Cardiology and HF clinic.  - Reminded to call for an overnight weight gain of >2 pounds or a weekly weight gain of >5 pounds - not adding salt to food and read food labels. Reviewed the importance of keeping daily sodium intake to <2057m daily. - Avoid Ibuprofen products.       Pulmonary hypertension (HCC)    Chronic. Not well controlled.  Has SOB and wheezing daily.  Will check Spirometry in 3 weeks.  Has an appt with Pulmonology in September.         Genitourinary   CKD (chronic kidney disease) stage 2, GFR 60-89 ml/min    Labs reviewed during visit. Will continue to monitor in the future.         Other   Cro[facility]'s disease ([facility]    S/p colectomy.  Does not follow up with GI. Declines referral at visit today. Will continue to reassess.       Dyslipidemia    Chronic.  Controlled.  Continue with current medication regimen of Atorvastatin 877mdaily.   Call sooner if concerns arise.        Other Visit Diagnoses     Elevated blood sugar       Elevated blood sugar on review of labs. Will need A1c at next visit.   Encounter to establish care            Follow up plan: Return in about 3 weeks (around 12/18/2021) for Spirometry.

## 2021-11-27 NOTE — Assessment & Plan Note (Signed)
Chronic. Not well controlled.  Has SOB and wheezing daily.  Will check Spirometry in 3 weeks.  Has an appt with Pulmonology in September.

## 2021-11-27 NOTE — Assessment & Plan Note (Signed)
Chronic. Continue to follow up with Cardiology and HF clinic.  - Reminded to call for an overnight weight gain of >2 pounds or a weekly weight gain of >5 pounds - not adding salt to food and read food labels. Reviewed the importance of keeping daily sodium intake to <2070m daily. - Avoid Ibuprofen products.

## 2021-11-27 NOTE — Assessment & Plan Note (Signed)
Labs reviewed during visit. Will continue to monitor in the future.

## 2021-11-27 NOTE — Assessment & Plan Note (Signed)
Chronic.  Controlled.  Continue with current medication regimen of Atorvastatin 38m daily.   Call sooner if concerns arise.

## 2021-11-27 NOTE — Assessment & Plan Note (Signed)
S/p colectomy.  Does not follow up with GI. Declines referral at visit today. Will continue to reassess.

## 2021-12-08 NOTE — Progress Notes (Unsigned)
Cardiology Office Note    Date:  12/09/2021   ID:  Clarence, [name] 1970/08/10, MRN [id number]  PCP:  Jon Billings, NP  Cardiologist:  Nelva Bush, MD  Electrophysiologist:  None  Advanced heart failure: Aundra Dubin  Chief Complaint: Follow-up  History of Present Illness:   Clarence [name] is a 51 y.o. 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   He was admitted to the hospital in 07/2021 with progressive dyspnea over the preceding several months and was found to have a new cardiomyopathy and atypical pneumonia.  High-sensitivity troponin 89.  BNP 559.  Echo during the admission showed an EF of 30 to 35%, global hypokinesis, moderate LVH, grade 2 diastolic dysfunction, normal RV systolic function and ventricular cavity size, mildly dilated left atrium, mild to moderate mitral regurgitation, and an estimated right atrial pressure of 15 mmHg.  He was diuresed and GDMT was initiated with symptomatic improvement.  Subsequent R/LHC showed diffuse moderate to severe multivessel CAD including CTO of the mid RCA, diffuse plaquing of LAD with up to 60% stenosis in the mid vessel, 60 to 70% proximal/mid LCx stenosis, and 90% stenosis of OM1.  Moderately elevated left heart filling pressure, severely elevated right heart filling pressure, moderate to severe pulmonary hypertension, moderately reduced cardiac output/index as outlined below.  It was suspected his cardiomyopathy was a combination of ischemic and nonischemic confounded by her significant pulmonary hypertension and right heart failure that may have been due to underlying pulmonary disease.  He was seen in hospital follow up on 08/29/2021 noting fatigue and an episode of near syncope.  BP was soft at  98/76 at that time.  Labs obtained at that time showed acute on CKD and hyperkalemia with a potassium of 6.0, leading him to be evaluated in the ED on 4/14 with persistent hyperkalemia and AKI.  He was treated with IV fluids and Lokelma.   He was admitted to the hospital from 4/25 through 4/27 with acute on chronic HFrEF secondary to accelerated hypertension.  BNP 178.  Troponin peaked at 139, and felt to represent demand ischemia.  GDMT was escalated with the addition of losartan and continuation of Coreg, BiDil, Farxiga, spironolactone, and Lasix.    He was seen in the ED on 4/30 through 5/1 with weakness and near syncope.  Troponin 80.  BNP 178.  CT head not acute.  CXR with stable cardiomegaly and very mild left basilar atelectasis.  D-dimer 1.99.  CTA chest without evidence of PE.  Symptoms improved with fluids, and presentation was felt to be in the setting of soft BP following escalation of GDMT.  He was advised to stop BiDil and losartan.  Lisinopril/HCTZ was also recommended to be held, though I do not know where this medication came from, as this medication was not on his discharge summary from the prior admission.    He was seen in the office on 10/01/2021 and was without symptoms of angina or decompensation.  His main complaint was intermittent fatigue and shortness of breath.  He was without dizziness, presyncope, or syncope.  BP was stable in the 704U systolic.  He was adherent to cardiac medications.  He was referred to the advanced heart failure clinic in [place].  Given prior issues with hyperkalemia, spironolactone was again discontinued.  He was started on losartan with recommendation to rechallenge with Entresto, off spironolactone, in follow-up.  He was admitted to the hospital in 09/2021 for chest pain and hypertensive urgency.  High-sensitivity troponin peaked at 290.  Chest x-ray showed no cardiopulmonary disease.  Echo showed an EF of 35 to 40%, global hypokinesis, moderate LVH,  grade 1 diastolic dysfunction, normal RV systolic function and ventricular cavity size, mildly dilated right atrium, mild mitral regurgitation, and mild dilatation of the ascending aorta measuring 40 mm.  Renal artery ultrasound concerning for possible right renal artery stenosis.  CTA of the abdomen/pelvis showed no acute abnormality.  CTA showed divergent findings from the prior renal artery ultrasound.  There were single renal arteries bilaterally with no evidence of stenosis, FMD, or other abnormality.  He was most recently admitted to the hospital in 10/2021 with demand ischemia in the setting of hypertensive urgency.  High-sensitivity troponin peaked at 587.  BNP 300.  He was treated with 48 hours of heparin drip.  Chest x-ray showed no active cardiopulmonary disease.  He was transitioned from losartan to [facility], and carvedilol was titrated to 12.5 mg twice daily.  Rechallenge of spironolactone was deferred given prior history of hyperkalemia.   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  I last saw him on 11/10/2021, at which time he was without symptoms of angina or decompensation.  Ongoing fatigue was unchanged.  Home BP readings were typically in the 767M to 094B systolic.  Entresto was titrated to 97/103 mg twice daily.  Otherwise, he was continued on 12.5 mg of carvedilol, Farxiga, and furosemide 20 mg daily.  Since we last saw him, he was evaluated by the advanced heart failure service with recommendation to titrate Lasix to 40 mg daily and start spironolactone.  Imdur was also titrated to 60 mg and Vascepa was added.  Cardiac MRI was recommended, and scheduled for late August.  He comes in today and is without symptoms of angina or decompensation.  He does note some intermittent dizziness and nausea.  With this, he request a refill  of Zofran.  Upon reviewing his pill bottles in the office today, he has been taking carvedilol 6.25 mg twice daily rather than the previously noted 12.5 mg twice daily, Entresto 49/51 mg twice daily rather than the previously noted 97/103 mg twice daily, and has been without furosemide for 1 week.  He did not start spironolactone following his visit with the advanced heart failure clinic.  He has also forgotten to take aspirin since he was last seen.  His weight is down 1 pound by our scale when compared to last clinic visit.  No significant dyspnea, angina, palpitations, lower extremity swelling, abdominal distention, or progressive orthopnea.  He is monitoring his sodium and fluid intake.   Labs independently reviewed: 10/2021 - potassium 4.3, BUN 14, serum creatinine 1.38, BNP 111, Hgb 12.0, PLT 159, magnesium 2.0 09/2021 - albumin 3.7, TC 149, TG 382, HDL 30, LDL 43, LP(a) 12.4 08/2021 - AST 44, ALT 59 07/2021 - A1c 5.4, TSH 0.258  Past Medical History:  Diagnosis Date   CHF (congestive heart failure) (HCC)    Chronic kidney disease    Crohn's disease ([place])    Hypertension    Proteinuria     Past Surgical History:  Procedure Laterality Date   COLECTOMY  RIGHT/LEFT HEART CATH AND CORONARY ANGIOGRAPHY N/A 08/07/2021   Procedure: RIGHT/LEFT HEART CATH AND CORONARY ANGIOGRAPHY;  Surgeon: Nelva Bush, MD;  Location: [facility];  Service: Cardiovascular;  Laterality: N/A;    Current Medications: Current Meds  Medication Sig   acetaminophen (TYLENOL) 325 MG tablet Take 650 mg by mouth at bedtime.   atorvastatin (LIPITOR) 80 MG tablet Take 1 tablet (80 mg total) by mouth every evening.   carvedilol (COREG) 6.25 MG tablet Take 6.25 mg by mouth 2 (two) times daily with a meal.   clopidogrel (PLAVIX) 75 MG tablet Take 1 tablet (75 mg total) by mouth daily.   dapagliflozin propanediol (FARXIGA) 10 MG TABS tablet Take 1 tablet (10 mg total) by mouth daily.   dapsone 100 MG tablet  Take 50 mg by mouth. Takes occasionally   ezetimibe (ZETIA) 10 MG tablet Take 1 tablet (10 mg total) by mouth daily.   famotidine (PEPCID) 10 MG tablet Take 10 mg by mouth at bedtime.   icosapent Ethyl (VASCEPA) 1 g capsule Take 2 capsules (2 g total) by mouth 2 (two) times daily.   isosorbide mononitrate (IMDUR) 60 MG 24 hr tablet Take 1 tablet (60 mg total) by mouth daily.   sacubitril-valsartan (ENTRESTO) 49-51 MG Take 1 tablet by mouth 2 (two) times daily.   [DISCONTINUED] ondansetron (ZOFRAN-ODT) 4 MG disintegrating tablet Take 4 mg by mouth every 8 (eight) hours as needed for nausea or vomiting.    Allergies:   Ibuprofen, Neomycin-bacitracin zn-polymyx, and Sulfacetamide   Social History   Socioeconomic History   Marital status: Single    Spouse name: Not on file   Number of children: Not on file   Years of education: Not on file   Highest education level: Not on file  Occupational History   Not on file  Tobacco Use   Smoking status: Former    Types: Cigars    Quit date: 08/13/2021    Years since quitting: 0.3   Smokeless tobacco: Not on file  Vaping Use   Vaping Use: Never used  Substance and Sexual Activity   Alcohol use: Not Currently    Alcohol/week: 1.0 standard drink of alcohol    Types: 1 Standard drinks or equivalent per week   Drug use: Never   Sexual activity: Not on file  Other Topics Concern   Not on file  Social History Narrative   Not on file   Social Determinants of Health   Financial Resource Strain: Not on file  Food Insecurity: Not on file  Transportation Needs: Not on file  Physical Activity: Not on file  Stress: Not on file  Social Connections: Not on file     Family History:  The patient's family history includes Cancer in his maternal aunt; Heart disease in his maternal uncle; Pancreatic cancer in his maternal uncle.  ROS:   12-point review of systems is negative unless otherwise noted in the HPI.   EKGs/Labs/Other Studies Reviewed:     Studies reviewed were summarized above. The additional studies were reviewed today:  Renal artery duplex 10/08/2021: IMPRESSION: 1. Two right-sided renal arteries, single left-sided renal artery. 2. Elevated peak systolic velocity greater than 200 centimeters/second in 1 of the 2 right renal arteries raises possibility of underlying stenosis. CT or MR arteriogram of the abdomen could further evaluate. 3. No evidence of left renal artery stenosis. 4. Normal sonographic appearance of the kidneys without evidence of hydronephrosis. __________   2D echo 10/07/2021: 1. Left ventricular  ejection fraction, by estimation, is 35 to 40%. The  left ventricle has moderately decreased function. The left ventricle  demonstrates global hypokinesis. There is moderate left ventricular  hypertrophy. Left ventricular diastolic  parameters are consistent with Grade I diastolic dysfunction (impaired  relaxation).   2. Right ventricular systolic function is normal. The right ventricular  size is normal.   3. Right atrial size was mildly dilated.   4. The mitral valve is normal in structure. Mild mitral valve  regurgitation. No evidence of mitral stenosis.   5. The aortic valve is tricuspid. Aortic valve regurgitation is not  visualized. No aortic stenosis is present.   6. There is mild dilatation of the ascending aorta, measuring 40 mm. __________   [facility] 08/07/2021: Conclusions: Diffuse moderate-severe, multivessel coronary artery disease, including chronic total occlusion of mid RCA, diffuse plaquing of LAD with up to 60% stenosis in the mid vessel, 60-70% proximal/mid LCx stenosis, and 90% lesion of OM1. Moderately elevated left heart filling pressures (LVEDP/PCWP 25 mmHg). Severely elevated right heart filling pressures (mean RA 16 mmHg, RVEDP 30 mmHg). Moderate-severe pulmonary hypertension (mean PAP 42 mmHg, PVR 4.0 WU). Moderately reduced Fick cardiac output/index (CO 4.2 L/min, CI 2.0  L/min/m^2).Marland Kitchen   Recommendations: Suspect cardiomyopathy is a combination of ischemic and nonischemic etiologies confounded by significant pulmonary hypertension and right heart failure that may be due to underlying lung disease.  Recommend continued diuresis and optimization of GDMT for acute HFrEF.  In the setting of ongoing wheezing with concern for obstructive lung disease, I will hold carvedilol for now and add Entresto. Secondary prevention of coronary artery disease, including high-intensity statin therapy.  Diffuse nature of CAD is not ideal for percutaneous or surgical revascularization.  Mild troponin elevation on admission is most likely due to demand ischemia in the setting of acute HFrEF and chronic ischemic heart disease. __________   2D echo 08/05/2021:  1. Left ventricular ejection fraction, by estimation, is 30 to 35%. The  left ventricle has moderately decreased function. The left ventricle  demonstrates global hypokinesis. There is moderate left ventricular  hypertrophy. Left ventricular diastolic  parameters are consistent with Grade II diastolic dysfunction  (pseudonormalization).   2. Right ventricular systolic function is normal. The right ventricular  size is normal. Tricuspid regurgitation signal is inadequate for assessing  PA pressure.   3. Left atrial size was mildly dilated.   4. The mitral valve is abnormal. Mild to moderate mitral valve  regurgitation. No evidence of mitral stenosis.   5. The aortic valve has an indeterminant number of cusps. Aortic valve  regurgitation is not visualized. No aortic stenosis is present.   6. The inferior vena cava is dilated in size with <50% respiratory  variability, suggesting right atrial pressure of 15 mmHg.   EKG:  EKG is ordered today.  The EKG ordered today demonstrates sinus bradycardia, 57 bpm, LVH with early repolarization abnormality, prior inferior infarct, lateral T wave inversion, nonspecific inferior ST elevation  consistent with prior tracing, overall stable when compared to prior tracings  Recent Labs: 08/04/2021: TSH 0.258 09/14/2021: ALT 59 10/20/2021: Magnesium 2.0 11/11/2021: B Natriuretic Peptide 111.9 12/09/2021: BUN 15; Creatinine, Ser 1.42; Hemoglobin 15.5; Platelets 179; Potassium 4.2; Sodium 139  Recent Lipid Panel    Component Value Date/Time   CHOL 149 10/07/2021 0309   TRIG 382 (H) 10/07/2021 0309   HDL 30 (L) 10/07/2021 0309   CHOLHDL 5.0 10/07/2021 0309   VLDL 76 (H) 10/07/2021 0309   LDLCALC 43 10/07/2021  0309   LDLDIRECT 69.0 09/09/2021 0329    PHYSICAL EXAM:    VS:  BP (!) 138/103 (BP Location: Left Arm, Patient Position: Sitting, Cuff Size: Large)   Pulse (!) 59   Ht 5' 10"  (1.778 m)   Wt 215 lb (97.5 kg)   SpO2 99%   BMI 30.85 kg/m   BMI: Body mass index is 30.85 kg/m.  Physical Exam Vitals reviewed.  Constitutional:      Appearance: He is well-developed.  HENT:     Head: Normocephalic and atraumatic.  Eyes:     General:        Right eye: No discharge.        Left eye: No discharge.  Neck:     Vascular: No JVD.  Cardiovascular:     Rate and Rhythm: Normal rate and regular rhythm.     Pulses:          Posterior tibial pulses are 2+ on the right side and 2+ on the left side.     Heart sounds: Normal heart sounds, S1 normal and S2 normal. Heart sounds not distant. No midsystolic click and no opening snap. No murmur heard.    No friction rub.  Pulmonary:     Effort: Pulmonary effort is normal. No respiratory distress.     Breath sounds: Normal breath sounds. No decreased breath sounds, wheezing or rales.  Chest:     Chest wall: No tenderness.  Abdominal:     General: There is no distension.     Palpations: Abdomen is soft.     Tenderness: There is no abdominal tenderness.  Musculoskeletal:     Cervical back: Normal range of motion.     Right lower leg: No edema.     Left lower leg: No edema.  Skin:    General: Skin is warm and dry.     Nails: There is  no clubbing.  Neurological:     Mental Status: He is alert and oriented to person, place, and time.  Psychiatric:        Speech: Speech normal.        Behavior: Behavior normal.        Thought Content: Thought content normal.        Judgment: Judgment normal.     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  For unclear reasons, he is on different dosages of carvedilol and Entresto when compared to his last clinic visits.  His blood pressure is elevated in the office today, though he has not yet taken his medications for the day.  We will continue him on current doses of carvedilol 6.25 mg twice daily along with Entresto 49/51 mg twice daily, and Farxiga 10 mg daily.  We will resume furosemide 20 mg daily.  We will defer escalation of GDMT at this time given orthostasis.  Historically, he has been maintained off spironolactone due to hyperkalemia.  He has been maintained off digoxin given prior renal dysfunction.  Continue with planned cardiac MRI next month.  Follow-up with the advanced heart failure service as directed.  Should his cardiomyopathy persist, with an EF of less than 35% he would need EP evaluation for ICD.  CAD involving the native  coronaries without angina/HLD/hypertriglyceridemia: He is doing well and without symptoms concerning for angina.  Continue aggressive risk factor modification and secondary prevention with recommendation to resume aspirin along with continuation of clopidogrel, Imdur, atorvastatin, and Vascepa  CKD stage III with history of hyperkalemia: Check BMP.  HTN: Blood pressure is elevated in the office today, though he has not yet taken his medications.  Continue medical therapy as outlined  above.  Nocturnal bradycardia with sleep disordered breathing: He has been referred to pulmonology for sleep study.  Pulmonary nodules: Stable on repeat chest CT.  Ectatic ascending thoracic aorta: Plan for CTA of the chest/aorta in 10/2022.    Disposition: F/u with Dr. Saunders Revel or an APP in 3 months.   Medication Adjustments/Labs and Tests Ordered: Current medicines are reviewed at length with the patient today.  Concerns regarding medicines are outlined above. Medication changes, Labs and Tests ordered today are summarized above and listed in the Patient Instructions accessible in Encounters.   Signed, Christell Faith, PA-C 12/09/2021 4:24 PM     [facility] [address] [place] [address] [place], [place] [address] [phone]

## 2021-12-09 ENCOUNTER — Other Ambulatory Visit
Admission: RE | Admit: 2021-12-09 | Discharge: 2021-12-09 | Disposition: A | Discharge status: Home / Self Care | Payer: Managed Care, Other (non HMO) | Source: Ambulatory Visit | Attending: Physician Assistant | Admitting: Physician Assistant

## 2021-12-09 ENCOUNTER — Encounter: Payer: Self-pay | Admitting: Physician Assistant

## 2021-12-09 ENCOUNTER — Ambulatory Visit (INDEPENDENT_AMBULATORY_CARE_PROVIDER_SITE_OTHER): Payer: Managed Care, Other (non HMO) | Admitting: Physician Assistant

## 2021-12-09 VITALS — BP 138/103 | HR 59 | Ht 70.0 in | Wt 215.0 lb

## 2021-12-09 DIAGNOSIS — I2510 Atherosclerotic heart disease of native coronary artery without angina pectoris: Secondary | ICD-10-CM | POA: Insufficient documentation

## 2021-12-09 DIAGNOSIS — R911 Solitary pulmonary nodule: Secondary | ICD-10-CM

## 2021-12-09 DIAGNOSIS — I5020 Unspecified systolic (congestive) heart failure: Secondary | ICD-10-CM

## 2021-12-09 DIAGNOSIS — I5022 Chronic systolic (congestive) heart failure: Secondary | ICD-10-CM | POA: Diagnosis present

## 2021-12-09 DIAGNOSIS — N183 Chronic kidney disease, stage 3 unspecified: Secondary | ICD-10-CM | POA: Insufficient documentation

## 2021-12-09 DIAGNOSIS — E875 Hyperkalemia: Secondary | ICD-10-CM | POA: Insufficient documentation

## 2021-12-09 DIAGNOSIS — I428 Other cardiomyopathies: Secondary | ICD-10-CM | POA: Diagnosis present

## 2021-12-09 DIAGNOSIS — I77810 Thoracic aortic ectasia: Secondary | ICD-10-CM

## 2021-12-09 DIAGNOSIS — I255 Ischemic cardiomyopathy: Secondary | ICD-10-CM

## 2021-12-09 DIAGNOSIS — E785 Hyperlipidemia, unspecified: Secondary | ICD-10-CM

## 2021-12-09 LAB — BASIC METABOLIC PANEL
Anion gap: 6 (ref 5–15)
BUN: 15 mg/dL (ref 6–20)
CO2: 26 mmol/L (ref 22–32)
Calcium: 9.6 mg/dL (ref 8.9–10.3)
Chloride: 107 mmol/L (ref 98–111)
Creatinine, Ser: 1.42 mg/dL — ABNORMAL HIGH (ref 0.61–1.24)
GFR, Estimated: 59 mL/min — ABNORMAL LOW (ref >60)
Glucose, Bld: 94 mg/dL (ref 70–99)
Potassium: 4.2 mmol/L (ref 3.5–5.1)
Sodium: 139 mmol/L (ref 135–145)

## 2021-12-09 LAB — CBC
HCT: 46.2 % (ref 39.0–52.0)
Hemoglobin: 15.5 g/dL (ref 13.0–17.0)
MCH: 28.7 pg (ref 26.0–34.0)
MCHC: 33.5 g/dL (ref 30.0–36.0)
MCV: 85.4 fL (ref 80.0–100.0)
Platelets: 179 10*3/uL (ref 150–400)
RBC: 5.41 MIL/uL (ref 4.22–5.81)
RDW: 13 % (ref 11.5–15.5)
WBC: 8.3 10*3/uL (ref 4.0–10.5)
nRBC: 0 % (ref 0.0–0.2)

## 2021-12-09 MED ORDER — ONDANSETRON 4 MG PO TBDP: 4.0000 mg | ORAL_TABLET | Freq: Three times a day (TID) | ORAL | 3 refills | Status: AC | PRN

## 2021-12-09 NOTE — Patient Instructions (Signed)
Medication Instructions:  No changes at this time.   *If you need a refill on your cardiac medications before your next appointment, please call your pharmacy*   Lab Work: BMET, CBC today over at the [facility].   If you have labs (blood work) drawn today and your tests are completely normal, you will receive your results only by: [place] (if you have MyChart) OR A paper copy in the mail If you have any lab test that is abnormal or we need to change your treatment, we will call you to review the results.   Testing/Procedures: None   Follow-Up: At [facility], you and your health needs are our priority.  As part of our continuing mission to provide you with exceptional heart care, we have created designated Provider Care Teams.  These Care Teams include your primary Cardiologist (physician) and Advanced Practice Providers (APPs -  Physician Assistants and Nurse Practitioners) who all work together to provide you with the care you need, when you need it.   Your next appointment:   3 month(s)  The format for your next appointment:   In Person  Provider:   Nelva Bush, MD or Christell Faith, PA-C      Important Information About Sugar

## 2021-12-11 ENCOUNTER — Encounter (HOSPITAL_COMMUNITY): Payer: Managed Care, Other (non HMO)

## 2021-12-12 ENCOUNTER — Telehealth: Payer: Self-pay | Admitting: *Deleted

## 2021-12-12 NOTE — Telephone Encounter (Signed)
-----   Message from Rise Mu, PA-C sent at 12/09/2021  3:15 PM EDT ----- Potassium at goal Renal function mildly elevated and consistent with prior readings when not volume up Blood count normal  Continue current medical therapy as discussed at his office visit Follow-up with the advanced heart failure service as scheduled for ongoing management

## 2021-12-12 NOTE — Telephone Encounter (Signed)
Left voicemail message to call back for review of results and/or review via My Chart and let us know if any questions.

## 2021-12-17 ENCOUNTER — Other Ambulatory Visit: Payer: Self-pay | Admitting: Physician Assistant

## 2021-12-17 ENCOUNTER — Telehealth: Payer: Self-pay

## 2021-12-17 NOTE — Telephone Encounter (Signed)
Copied from [place] [id number]. Topic: General - Other >> Dec 16, 2021  4:51 PM Shiquita J wrote: Reason for CRM: pt called in to cancel his upcoming appt. Pt says that he lost his job and is unable to see provider due to no insurance. Pt would like to know if possible could provider advise him of any financial assistance programs that could help? Pt says that he has health concerns and really dont want to go without treatment.    CB: [phone]- please assist pt further.

## 2021-12-17 NOTE — Telephone Encounter (Signed)
LVM asking patient to come to the office to pick up financial assistance info/application.

## 2021-12-18 ENCOUNTER — Ambulatory Visit: Payer: Managed Care, Other (non HMO) | Admitting: Nurse Practitioner

## 2021-12-18 ENCOUNTER — Telehealth: Payer: Self-pay | Admitting: *Deleted

## 2021-12-18 MED ORDER — SACUBITRIL-VALSARTAN 49-51 MG PO TABS
1.0000 | ORAL_TABLET | Freq: Two times a day (BID) | ORAL | 0 refills | Status: DC
Start: 2021-12-18 — End: 2021-12-18

## 2021-12-18 MED ORDER — EMPAGLIFLOZIN 10 MG PO TABS: 10.0000 mg | ORAL_TABLET | Freq: Every day | ORAL | 3 refills | Status: DC

## 2021-12-18 MED ORDER — SACUBITRIL-VALSARTAN 49-51 MG PO TABS: 1.0000 | ORAL_TABLET | Freq: Two times a day (BID) | ORAL | 3 refills | Status: DC

## 2021-12-18 NOTE — Telephone Encounter (Signed)
Pt needs medication alternative, not covered by insurance.  Thanks.

## 2021-12-18 NOTE — Telephone Encounter (Signed)
Would you like me to send in [place]?

## 2021-12-18 NOTE — Telephone Encounter (Signed)
Patient assistance applications completed and pending patients signature.

## 2021-12-18 NOTE — Telephone Encounter (Signed)
[employer] patient assistance application.

## 2021-12-19 NOTE — Telephone Encounter (Signed)
Needs multiple signatures for both applications. Placed in "Patient Assistance" folder in my files.

## 2021-12-22 ENCOUNTER — Ambulatory Visit (HOSPITAL_COMMUNITY)
Admission: RE | Admit: 2021-12-22 | Discharge: 2021-12-22 | Disposition: A | Discharge status: Home / Self Care | Payer: Self-pay | Source: Ambulatory Visit | Attending: Internal Medicine | Admitting: Internal Medicine

## 2021-12-22 ENCOUNTER — Telehealth (HOSPITAL_COMMUNITY): Payer: Self-pay | Admitting: *Deleted

## 2021-12-22 ENCOUNTER — Other Ambulatory Visit (HOSPITAL_COMMUNITY): Payer: Self-pay

## 2021-12-22 DIAGNOSIS — I255 Ischemic cardiomyopathy: Secondary | ICD-10-CM

## 2021-12-22 DIAGNOSIS — I5022 Chronic systolic (congestive) heart failure: Secondary | ICD-10-CM

## 2021-12-22 MED ORDER — ATORVASTATIN CALCIUM 80 MG PO TABS
80.0000 mg | ORAL_TABLET | Freq: Every evening | ORAL | 3 refills | Status: DC
  Filled 2021-12-22: qty 30, 30d supply, fill #0

## 2021-12-22 MED ORDER — CARVEDILOL 6.25 MG PO TABS
6.2500 mg | ORAL_TABLET | Freq: Two times a day (BID) | ORAL | 3 refills | Status: DC
  Filled 2021-12-22: qty 60, 30d supply, fill #0
  Filled 2022-01-16: qty 60, 30d supply, fill #1
  Filled 2022-02-25: qty 60, 30d supply, fill #2

## 2021-12-22 MED ORDER — CLOPIDOGREL BISULFATE 75 MG PO TABS
75.0000 mg | ORAL_TABLET | Freq: Every day | ORAL | 3 refills | Status: DC
  Filled 2021-12-22: qty 30, 30d supply, fill #0

## 2021-12-22 MED ORDER — ISOSORBIDE MONONITRATE ER 60 MG PO TB24
60.0000 mg | ORAL_TABLET | Freq: Every day | ORAL | 3 refills | Status: DC
  Filled 2021-12-22: qty 30, 30d supply, fill #0
  Filled 2022-02-05: qty 30, 30d supply, fill #1
  Filled 2022-03-23: qty 30, 30d supply, fill #2
  Filled 2022-04-17: qty 30, 30d supply, fill #3

## 2021-12-22 MED ORDER — EMPAGLIFLOZIN 10 MG PO TABS
10.0000 mg | ORAL_TABLET | Freq: Every day | ORAL | 3 refills | Status: DC
  Filled 2021-12-22: qty 30, 30d supply, fill #0

## 2021-12-22 NOTE — Telephone Encounter (Signed)
Received message from [facility] in [place], pt has lost insurance and unable to get meds.  I called and spoke w/pt, he states due to cardiac condition he lost his job and insurance. He does have transportation and is going to come to our office this afternoon to complete pt assist application for entresto, other cardiac meds will be filled through [facility] at [employer].

## 2021-12-22 NOTE — Progress Notes (Signed)
Pt came in with all medication bottles to discuss pt assistance, pt has been taking:  ASA 81 mg daily Plavix 75 mg daily Furosemide 20 mg daily Imdur 60 mg daily Spironolactone 25 mg daily (stopped due to hyperkalemia) Farxiga 10 mg daily (now changed to Jardiance per chart) Atorvastatin 80 mg daily Zetia 10 mg daily Vascepa 2 g bid  Entresto & Carvedilol bottles both empty, he states he ran out and hasn't been able to get refilled due to cost.   Per 7/27 OV note with Christell Faith, PA pt was supposed to be taking: continue him on current doses of carvedilol 6.25 mg twice daily along with Entresto 49/51 mg twice daily, and Farxiga 10 mg daily.  We will resume furosemide 20 mg daily. maintained off spironolactone due to hyperkalemia. resume aspirin along with continuation of clopidogrel, Imdur, atorvastatin, and Vascepa   Provided pt with Entresto samples, he signed pt assistance application which will be sent in today, he will call [employer] next week to f/u if he hasn't heard from them. RX for Carvedilol, Jardiance (per chart he was changed to this from [place]), Imdur, Plavix, and Atorvastatin all sent to [place] under [facility]. Pt reports he has applications for SS disability and medicaid both pending. Cancelled cMRI for now as no insurance, advised we will resch once he has insurance again. Provided pt pill box and spent >30 min reviewing meds and providing education, he will keep appt as sch 8/16 with [facility] and will see [place] back on 9/12.

## 2021-12-22 NOTE — Patient Instructions (Signed)
Continue medications as listed below on med list  We have given you Entresto samples and submitted a patient assistance application to [employer], please call them next week if you have not heard from them  We will cancel your Cardiac MRI at this time and will reorder once you have insurance  Your physician recommends that you schedule a follow-up appointment in: our office in 4 weeks  Do the following things EVERYDAY: Weigh yourself in the morning before breakfast. Write it down and keep it in a log. Take your medicines as prescribed Eat low salt foods--Limit salt (sodium) to 2000 mg per day.  Stay as active as you can everyday Limit all fluids for the day to less than 2 liters  If you have any questions or concerns before your next appointment please send [place] a message through [place] or call our office at [phone].    TO LEAVE A MESSAGE FOR THE NURSE SELECT OPTION 2, PLEASE LEAVE A MESSAGE INCLUDING: YOUR NAME DATE OF BIRTH CALL BACK NUMBER REASON FOR CALL**this is important as we prioritize the call backs  YOU WILL RECEIVE A CALL BACK THE SAME DAY AS LONG AS YOU CALL BEFORE 4:00 PM  At the [facility], you and your health needs are our priority. As part of our continuing mission to provide you with exceptional heart care, we have created designated Provider Care Teams. These Care Teams include your primary Cardiologist (physician) and Advanced Practice Providers (APPs- Physician Assistants and Nurse Practitioners) who all work together to provide you with the care you need, when you need it.   You may see any of the following providers on your designated Care Team at your next follow up: Dr Glori Bickers Dr Haynes Kerns, NP Lyda Jester, [place] [facility] [place], [place] Audry Riles, PharmD   Please be sure to bring in all your medications bottles to every appointment.

## 2021-12-22 NOTE — Progress Notes (Signed)
Medication Samples have been provided to the patient.  Drug name: Delene Loll       Strength: 49/51 mg        Qty: 2  LOT: [id number]  Exp.Date: 11/2023  Dosing instructions: take 1 tab Twice daily   The patient has been instructed regarding the correct time, dose, and frequency of taking this medication, including desired effects and most common side effects.   Lexys Milliner 4:39 PM 12/22/2021

## 2021-12-22 NOTE — Addendum Note (Signed)
Encounter addended by: Scarlette Calico, RN on: 12/22/2021 4:40 PM  Actions taken: Clinical Note Signed

## 2021-12-24 ENCOUNTER — Telehealth: Payer: Self-pay | Admitting: *Deleted

## 2021-12-24 MED ORDER — DAPAGLIFLOZIN PROPANEDIOL 10 MG PO TABS: 10.0000 mg | ORAL_TABLET | Freq: Every day | ORAL | 11 refills | Status: DC

## 2021-12-24 NOTE — Telephone Encounter (Signed)
Patient came by office and signed applications.

## 2021-12-24 NOTE — Telephone Encounter (Signed)
Patient came by office and was confused with his medications. Reviewed what we have on file and provider recommendations for him to stay on Farxiga. I did inquire about how long he had been taking both and reports only only once last night. Discussed request for labs and he said that he only took it once. Advised that I would update provider of our conversation with no further questions at this time.

## 2021-12-24 NOTE — Telephone Encounter (Signed)
Left voicemail message to call back for review of his medications and discuss further information.     Rise Mu, PA-C  Just now (10:50 AM)    When I last saw him on 12/09/2021, he was on Faxiga 10 mg daily. This is what he should be on. Not, Jardiance, and certainly not both. If he has been taking both [place] and [place], he will need to come to the [place] for a BMP to assess his renal function. There has been significant confusion with his medications, frequently taking the wrong pills. Please advise him to bring all medications to every appointment for review.

## 2021-12-25 MED ORDER — SACUBITRIL-VALSARTAN 49-51 MG PO TABS: 1.0000 | ORAL_TABLET | Freq: Two times a day (BID) | ORAL | 3 refills | Status: DC

## 2021-12-25 NOTE — Telephone Encounter (Signed)
See telephone encounters. Patient came by office 12/24/21 and all information reviewed in detail. No further needs at this time.

## 2021-12-25 NOTE — Addendum Note (Signed)
Encounter addended by: Scarlette Calico, RN on: 12/25/2021 12:36 PM  Actions taken: Order list changed

## 2021-12-25 NOTE — Telephone Encounter (Signed)
Late entry. Patient came by office on 12/24/21 and results, recommendations, and other information reviewed. No further needs at this time.

## 2021-12-25 NOTE — Telephone Encounter (Signed)
Heart Failure clinic in [place] has completed applications for assistance with this patient.

## 2021-12-31 ENCOUNTER — Ambulatory Visit: Payer: Managed Care, Other (non HMO) | Admitting: Family

## 2021-12-31 NOTE — Progress Notes (Deleted)
Patient ID: Clarence [name], male    DOB: [birth year], 51 y.o.   MRN: [id number]  HPI  Mr Kessinger is a 51 y/o male with a history of HTN, CKD, Crohn's, tobacco use and chronic heart failure.   Echo report from 10/07/21 reviewed and showed an EF of 35-40% along with moderate LVH and mild MR. Echo report from 08/05/21 reviewed and showed an EF of 30-35% along with moderate LVH, mild LAE and mild/moderate MR.   LHC done 08/07/21 showed: Diffuse moderate-severe, multivessel coronary artery disease, including chronic total occlusion of mid RCA, diffuse plaquing of LAD with up to 60% stenosis in the mid vessel, 60-70% proximal/mid LCx stenosis, and 90% lesion of OM1. Moderately elevated left heart filling pressures (LVEDP/PCWP 25 mmHg). Severely elevated right heart filling pressures (mean RA 16 mmHg, RVEDP 30 mmHg). Moderate-severe pulmonary hypertension (mean PAP 42 mmHg, PVR 4.0 WU). Moderately reduced Fick cardiac output/index (CO 4.2 L/min, CI 2.0 L/min/m^2).Marland Kitchen  Admitted 10/20/21 due to chest pain and shortness of breath. Placed on IV heparin for 48 hours. Entresto started for HTN. Cardiology consult obtained.       Discharged after 3 days. Admitted 10/06/21 due to acute onset of left shoulder pain followed by midsternal chest pain felt as tightness, moderate in intensity with associated radiation to the left arm with numbness. Cardiology and vascular consults obtained. Obtained Renal ultrasound with possible RAS, ordered CTA that did not show renal artery stenosis. Elevated troponin thought to be due to demand ischemia. Placed on lasix QOD. Discharged after 5 days. 1 admission and 2 ED visits April 2023. Admitted 08/04/21 due to worsening dyspnea on exertion as well as orthopnea and lower extremity swelling. Lasix 80 IV given as well as corticosteroid and azithromycin/ceftriaxone. Pt had cardiac cath which showed mod-severe multivessel disease that will be treated medically. Cardiology consult obtained.  Elevated troponin thought to be due to demand ischemia. Discharged after 6 days.   He presents today for a follow-up visit with a chief complaint of   Past Medical History:  Diagnosis Date   CHF (congestive heart failure) ([place])    Chronic kidney disease    Crohn's disease ([place])    Hypertension    Proteinuria    Past Surgical History:  Procedure Laterality Date   COLECTOMY     RIGHT/LEFT HEART CATH AND CORONARY ANGIOGRAPHY N/A 08/07/2021   Procedure: RIGHT/LEFT HEART CATH AND CORONARY ANGIOGRAPHY;  Surgeon: Nelva Bush, MD;  Location: [facility];  Service: Cardiovascular;  Laterality: N/A;   Family History  Problem Relation Age of Onset   Cancer Maternal Aunt    Heart disease Maternal Uncle    Pancreatic cancer Maternal Uncle    Social History   Tobacco Use   Smoking status: Former    Types: Cigars    Quit date: 08/13/2021    Years since quitting: 0.3   Smokeless tobacco: Not on file  Substance Use Topics   Alcohol use: Not Currently    Alcohol/week: 1.0 standard drink of alcohol    Types: 1 Standard drinks or equivalent per week   Allergies  Allergen Reactions   Ibuprofen Hives and Other (See Comments)    Other reaction(s): GI Upset (intolerance) Hives Tolerates Advil    Neomycin-Bacitracin Zn-Polymyx     Other reaction(s): Other (See Comments)   Sulfacetamide Other (See Comments)     Review of Systems  Constitutional:  Positive for fatigue (improving). Negative for appetite change.  HENT:  Negative for congestion, postnasal  drip and sore throat.   Eyes: Negative.   Respiratory:  Positive for shortness of breath. Negative for cough.   Cardiovascular:  Negative for chest pain, palpitations and leg swelling.  Gastrointestinal:  Negative for abdominal distention and abdominal pain.  Endocrine: Negative.   Genitourinary: Negative.   Musculoskeletal:  Negative for back pain and neck pain.  Skin: Negative.   Allergic/Immunologic: Negative.    Neurological:  Positive for light-headedness. Negative for dizziness.  Hematological:  Negative for adenopathy. Does not bruise/bleed easily.  Psychiatric/Behavioral:  Negative for dysphoric mood and sleep disturbance (sleeping on 4 pillows). The patient is nervous/anxious.     Physical Exam Vitals and nursing note reviewed.  Constitutional:      Appearance: Normal appearance.  HENT:     Head: Normocephalic and atraumatic.  Cardiovascular:     Rate and Rhythm: Regular rhythm. Bradycardia present.  Pulmonary:     Effort: Pulmonary effort is normal. No respiratory distress.     Breath sounds: No wheezing or rales.  Abdominal:     General: There is no distension.     Palpations: Abdomen is soft.  Musculoskeletal:        General: No tenderness.     Cervical back: Normal range of motion and neck supple.     Right lower leg: No edema.     Left lower leg: No edema.  Skin:    General: Skin is warm and dry.  Neurological:     General: No focal deficit present.     Mental Status: He is alert and oriented to person, place, and time.  Psychiatric:        Mood and Affect: Mood normal.        Behavior: Behavior normal.        Thought Content: Thought content normal.    Assessment & Plan:  1: Chronic heart failure with reduced ejection fraction- - NYHA class II - euvolemic today - not weighing daily but does have scales; encouraged to resume weighing daily so that he can call for an overnight weight gain of > 2 pounds or a weekly weight gain of > 5 pounds - weight up 3 pounds from last visit here 6 weeks ago - not adding salt and has been trying to read food labels for sodium content - reviewed the importance of keeping daily fluid intake to 60-64 ounces/ daily - saw cardiology ([place]) 10/01/21 - on GDMT of farxiga, entresto & carvedilol - developed hyperkalemia with spironolactone - has been walking some but admits that he gets anxious; discussed pulmonary rehab and he is interested  in that so a referral was placed - BNP 10/20/21 was 300.6 - PharmD reconciled medications with the patient  2: HTN with CKD- - BP looks good (123/81) - seeing new PCP at [facility] on 11/05/21 - BMP 10/23/21 showed sodium 140, potassium 4.2, creatinine 1.16 & GFR >60   Medication bottles reviewed. He will send photos of the medications he has at home that he hasn't started so we can advise him of what to do.   Information provided on [facility] so that he can go ahead and pick up the application and have it ready for when he becomes uninsured.  Return in 2 months, sooner if needed.

## 2022-01-01 ENCOUNTER — Telehealth (HOSPITAL_COMMUNITY): Payer: Self-pay | Admitting: *Deleted

## 2022-01-01 NOTE — Telephone Encounter (Signed)
Lorenza [place] [employer] auth request faxed to [facility].

## 2022-01-02 NOTE — Telephone Encounter (Signed)
Received fax from [employer] that pt is no longer enrolled. I called pt to see if he has other coverage no answer/left vm for return call.

## 2022-01-11 ENCOUNTER — Emergency Department
Admission: EM | Admit: 2022-01-11 | Discharge: 2022-01-11 | Disposition: A | Discharge status: Home / Self Care | Payer: Self-pay | Attending: Emergency Medicine | Admitting: Emergency Medicine

## 2022-01-11 ENCOUNTER — Other Ambulatory Visit: Payer: Self-pay

## 2022-01-11 ENCOUNTER — Encounter: Payer: Self-pay | Admitting: Emergency Medicine

## 2022-01-11 DIAGNOSIS — I10 Essential (primary) hypertension: Secondary | ICD-10-CM | POA: Insufficient documentation

## 2022-01-11 DIAGNOSIS — H00012 Hordeolum externum right lower eyelid: Secondary | ICD-10-CM | POA: Insufficient documentation

## 2022-01-11 NOTE — Discharge Instructions (Addendum)
Warm compresses.

## 2022-01-11 NOTE — ED Provider Notes (Signed)
   [facility] Provider Note    Event Date/Time   First MD Initiated Contact with Patient 01/11/22 [phone]     (approximate)   History   Eye Problem   HPI  Clarence [name] is a 51 y.o. male who presents to the ED for evaluation of Eye Problem   Patient presents to the ED for evaluation of painful swelling to the lower lid margin of his right eyelid.  He reports no systemic symptoms, trauma or injuries.  Denies any vision changes.   Physical Exam   Triage Vital Signs: ED Triage Vitals  Enc Vitals Group     BP 01/11/22 0048 (!) 170/106     Pulse Rate 01/11/22 0048 67     Resp 01/11/22 0048 18     Temp 01/11/22 0048 98.5 F (36.9 C)     Temp Source 01/11/22 0048 Oral     SpO2 01/11/22 0048 100 %     Weight 01/11/22 0049 210 lb (95.3 kg)     Height 01/11/22 0049 5' 10"  (1.778 m)     Head Circumference --      Peak Flow --      Pain Score 01/11/22 0055 0     Pain Loc --      Pain Edu? --      Excl. in [place]? --     Most recent vital signs: Vitals:   01/11/22 0048 01/11/22 0518  BP: (!) 170/106 (!) 199/115  Pulse: 67 64  Resp: 18 18  Temp: 98.5 F (36.9 C) 98 F (36.7 C)  SpO2: 100% 95%    General: Awake, no distress.  CV:  Good peripheral perfusion.  Resp:  Normal effort.  Abd:  No distention.  MSK:  No deformity noted.  Neuro:  No focal deficits appreciated. Other:  Mild swelling to the medial aspect of the lower lid on the right side as pictured below.  Appears to be a stye.     ED Results / Procedures / Treatments   Labs (all labs ordered are listed, but only abnormal results are displayed) Labs Reviewed - No data to display  EKG   RADIOLOGY   Official radiology report(s): No results found.  PROCEDURES and INTERVENTIONS:  Procedures  Medications - No data to display   IMPRESSION / MDM / [place] / ED COURSE  I reviewed the triage vital signs and the nursing notes.  Differential diagnosis includes,  but is not limited to, hordeolum, chalazion, blepharitis  51 year old male presents to the ED with evidence of hordeolum suitable for outpatient management.  We discussed warm compresses and return precautions.  I see no indications for systemic treatments.  Chronically hypertensive without symptoms to suggest hypertensive related pathology, we discussed monitoring his blood pressure and following up with his PCP.       FINAL CLINICAL IMPRESSION(S) / ED DIAGNOSES   Final diagnoses:  Hordeolum externum of right lower eyelid     Rx / DC Orders   ED Discharge Orders     None        Note:  This document was prepared using Dragon voice recognition software and may include unintentional dictation errors.   Vladimir Crofts, MD 01/11/22 [phone]

## 2022-01-11 NOTE — ED Triage Notes (Signed)
Pt arrived via POV with reports of eye swelling under R eye, pt has been and OTC gel for eye, pt also reports itching to eyes.

## 2022-01-13 ENCOUNTER — Telehealth (HOSPITAL_COMMUNITY): Payer: Self-pay | Admitting: Pharmacy Technician

## 2022-01-13 ENCOUNTER — Other Ambulatory Visit (HOSPITAL_COMMUNITY): Payer: Self-pay

## 2022-01-13 NOTE — Telephone Encounter (Signed)
Advanced Heart Failure Patient Psychologist, prison and probation services sent via fax on 08/10. [employer], POI is still missing.

## 2022-01-15 ENCOUNTER — Other Ambulatory Visit (HOSPITAL_COMMUNITY): Payer: Managed Care, Other (non HMO)

## 2022-01-16 ENCOUNTER — Other Ambulatory Visit (HOSPITAL_COMMUNITY): Payer: Self-pay

## 2022-01-16 ENCOUNTER — Ambulatory Visit: Payer: Managed Care, Other (non HMO) | Admitting: Primary Care

## 2022-01-20 ENCOUNTER — Other Ambulatory Visit (HOSPITAL_COMMUNITY): Payer: Self-pay

## 2022-01-26 ENCOUNTER — Telehealth (HOSPITAL_COMMUNITY): Payer: Self-pay

## 2022-01-26 NOTE — Telephone Encounter (Signed)
Called and left patient a voice message  to confirm/remind patient of their appointment at the [facility] on 01/27/22.

## 2022-01-26 NOTE — Progress Notes (Signed)
PCP: Jon Billings, NP Cardiology: Dr. Saunders Revel HF Cardiology: Dr Aundra Dubin  51 y.o. with history of CAD, chronic systolic CHF, Crohns disease, CKD stage 3, and HTN was referred by Dr. Saunders Revel to HF clinic for evaluation of CHF.  Patient was admitted in 3/23 with newly found cardiomyopathy, CHF and atypical PNA.  Echo in 3/23 showed EF 30-35%, moderate LVH, normal RV. LHC showed occluded RCA with collaterals, 60% mLAD, 65% mLCx, 90% mid OM1.  RHC showed elevated filling pressures and low CI (2).  Patient was admitted again in 4/23 with CHF. He was admitted in 5/23 with hypertensive urgency and chest pain, CTA abdomen/pelvis did not show renal artery stenosis. He was admitted again in 6/23 with hypertensive urgency.  He quit smoking after 3/23 admission.  Rarely drinks ETOH and no drugs.  No strong family history of CAD or cardiomyopathy.    Follow up 6/23, continued with chronic chest pressure + dyspnea with walking fast for a long distance. Imdur increased and arranged for cardiac MRI.  Today he returns for HF follow up. Overall feeling fair. He is SOB with ADLs or walking on flat ground. Having occasional light headedness. Denies palpitations, CP, abnormal bleeding, edema, or PND/Orthopnea. Appetite ok. No fever or chills. Weight at home 222 pounds. Taking all medications. He is uninsured. No ETOH./tobacco use  Labs (5/23): LDL 43, TGs 382 Labs (6/23): K 4.2, creatinine 1.16 Labs (7/23): K 4.2, creatinine 1.42  ECG (personally reviewed): none ordered today.  PMH: 1. Crohns disease: s/p colectomy with ostomy.  2. CKD stage 3: History of hyperkalemia.  3. HTN: CT abdomen/pelvis with no evidence for renal artery stenosis.  4. Pulmonary nodules: Stable on last CT.  5. CAD: LHC 3/23 with CTO mid RCA with collaterals, 60% mLAD, 65% mLCx, 90% mid OM1.  6. Chronic systolic CHF: Ischemic cardiomyopathy (cannot fully rule out nonischemic component).  - Echo (3/23): EF 30-35%, moderate LVH, normal RV. - RHC  (3/23): mean RA 16, PA 67/30 mean 42, mean PCWP 25, CI 2, PVR 4 - Echo (5/23): EF 35-40%, global HK, moderate LVH, RV normal, mild mR  SH: Single, quit smoking in 3/23, rare ETOH/no drugs, lives in [place] and works for a [employer].   FH: Uncles with "heart trouble"  ROS: All systems reviewed and negative except as per HPI.   Current Outpatient Medications  Medication Sig Dispense Refill   acetaminophen (TYLENOL) 325 MG tablet Take 650 mg by mouth at bedtime.     aspirin EC 81 MG tablet Take 81 mg by mouth daily. Swallow whole.     atorvastatin (LIPITOR) 80 MG tablet Take 1 tablet (80 mg total) by mouth every evening. 30 tablet 3   carvedilol (COREG) 6.25 MG tablet Take 1 tablet (6.25 mg total) by mouth 2 (two) times daily with a meal. 60 tablet 3   clopidogrel (PLAVIX) 75 MG tablet Take 1 tablet (75 mg total) by mouth daily. 30 tablet 3   ezetimibe (ZETIA) 10 MG tablet Take 1 tablet (10 mg total) by mouth daily. 90 tablet 3   famotidine (PEPCID) 10 MG tablet Take 10 mg by mouth at bedtime. As needed     furosemide (LASIX) 20 MG tablet Take 20 mg by mouth daily.     icosapent Ethyl (VASCEPA) 1 g capsule Take 2 capsules (2 g total) by mouth 2 (two) times daily. 180 capsule 3   isosorbide mononitrate (IMDUR) 60 MG 24 hr tablet Take 1 tablet (60 mg total) by mouth  daily. 30 tablet 3   sacubitril-valsartan (ENTRESTO) 49-51 MG Take 1 tablet by mouth 2 (two) times daily. 180 tablet 3   No current facility-administered medications for this encounter.   Wt Readings from Last 3 Encounters:  01/27/22 101.9 kg (224 lb 9.6 oz)  01/11/22 95.3 kg (210 lb)  12/09/21 97.5 kg (215 lb)   BP 128/88   Pulse 81   Wt 101.9 kg (224 lb 9.6 oz)   SpO2 96%   BMI 32.23 kg/m  Physical Exam General:  NAD. No resp difficulty HEENT: Normal Neck: Supple. JVP 7-8, thick neck. Carotids 2+ bilat; no bruits. No lymphadenopathy or thryomegaly appreciated. Cor: PMI nondisplaced. Regular rate & rhythm. No  rubs, gallops or murmurs. Lungs: Clear Abdomen: Soft, nontender, nondistended. No hepatosplenomegaly. No bruits or masses. Good bowel sounds. Extremities: No cyanosis, clubbing, rash, edema Neuro: Alert & oriented x 3, cranial nerves grossly intact. Moves all 4 extremities w/o difficulty. Affect pleasant.  Assessment/Plan: 1. Chronic systolic CHF: Suspect primarily ischemic cardiomyopathy but cannot rule out component of nonischemic cardiomyopathy. RHC in 3/23 with elevated filling pressures and low CI, 2. Echo in 5/23 with EF 35-40%, normal RV.  NYHA class III symptoms with some volume overload on exam, weight up 8 lbs. - Increase Entresto to 97/103 mg bid. BMET today, repeat in 10-14 days. - Continue Lasix 20 mg daily.  - Continue Coreg 6.25 mg bid.  - Continue Farxiga 10 mg daily.  - Off spiro with hyperkalemia. - Unable to arrange for cardiac MRI to quantify EF (currently borderline for ICD) and to see if evidence for infiltrative disease or myocarditis is present in addition to prior MI, as he is uninsured.   2. CAD: Cath in 3/23 with CTO mid RCA with collaterals, 60% mLAD, 65% mLCx, 90% mid OM1.  Managed medically.  He has chronic stable angina. None recently. - Continue Imdur 60 mg daily.  - Continue ASA 81 and Plavix 75 daily.  - Continue atorvastatin and Zetia, good LDL in 5/23.  - Continue Vascepa 2 g bid with elevated triglycerides, Check lipids/LFTs today. 3. Suspect OSA: Referred to pulmonary for sleep study.  4. HTN: Elevated. Increase Entresto as above. 5. SDOH: un-insured, engage HFSW. - Meds thru HF fund. - He has transportation, cell phone and # verified today, and he has a scale. - Consider paramedicine for medication management.  Follow up in 3 months with Dr. Aundra Dubin.   Maricela Bo [facility] [name]-BC 01/27/2022

## 2022-01-27 ENCOUNTER — Other Ambulatory Visit (HOSPITAL_COMMUNITY): Payer: Self-pay

## 2022-01-27 ENCOUNTER — Encounter (HOSPITAL_COMMUNITY): Payer: Self-pay

## 2022-01-27 ENCOUNTER — Ambulatory Visit (HOSPITAL_COMMUNITY)
Admission: RE | Admit: 2022-01-27 | Discharge: 2022-01-27 | Disposition: A | Discharge status: Home / Self Care | Payer: Self-pay | Source: Ambulatory Visit | Attending: Family Medicine | Admitting: Family Medicine

## 2022-01-27 VITALS — BP 128/88 | HR 81 | Wt 224.6 lb

## 2022-01-27 DIAGNOSIS — I130 Hypertensive heart and chronic kidney disease with heart failure and stage 1 through stage 4 chronic kidney disease, or unspecified chronic kidney disease: Secondary | ICD-10-CM | POA: Insufficient documentation

## 2022-01-27 DIAGNOSIS — Z7902 Long term (current) use of antithrombotics/antiplatelets: Secondary | ICD-10-CM | POA: Insufficient documentation

## 2022-01-27 DIAGNOSIS — N183 Chronic kidney disease, stage 3 unspecified: Secondary | ICD-10-CM | POA: Insufficient documentation

## 2022-01-27 DIAGNOSIS — E781 Pure hyperglyceridemia: Secondary | ICD-10-CM | POA: Insufficient documentation

## 2022-01-27 DIAGNOSIS — Z139 Encounter for screening, unspecified: Secondary | ICD-10-CM

## 2022-01-27 DIAGNOSIS — I2510 Atherosclerotic heart disease of native coronary artery without angina pectoris: Secondary | ICD-10-CM

## 2022-01-27 DIAGNOSIS — R29818 Other symptoms and signs involving the nervous system: Secondary | ICD-10-CM

## 2022-01-27 DIAGNOSIS — R0602 Shortness of breath: Secondary | ICD-10-CM | POA: Insufficient documentation

## 2022-01-27 DIAGNOSIS — I25118 Atherosclerotic heart disease of native coronary artery with other forms of angina pectoris: Secondary | ICD-10-CM | POA: Insufficient documentation

## 2022-01-27 DIAGNOSIS — I255 Ischemic cardiomyopathy: Secondary | ICD-10-CM | POA: Insufficient documentation

## 2022-01-27 DIAGNOSIS — R42 Dizziness and giddiness: Secondary | ICD-10-CM | POA: Insufficient documentation

## 2022-01-27 DIAGNOSIS — Z79899 Other long term (current) drug therapy: Secondary | ICD-10-CM | POA: Insufficient documentation

## 2022-01-27 DIAGNOSIS — Z7901 Long term (current) use of anticoagulants: Secondary | ICD-10-CM | POA: Insufficient documentation

## 2022-01-27 DIAGNOSIS — K5090 Crohn's disease, unspecified, without complications: Secondary | ICD-10-CM | POA: Insufficient documentation

## 2022-01-27 DIAGNOSIS — G4719 Other hypersomnia: Secondary | ICD-10-CM | POA: Insufficient documentation

## 2022-01-27 DIAGNOSIS — Z87891 Personal history of nicotine dependence: Secondary | ICD-10-CM | POA: Insufficient documentation

## 2022-01-27 DIAGNOSIS — I5022 Chronic systolic (congestive) heart failure: Secondary | ICD-10-CM | POA: Insufficient documentation

## 2022-01-27 DIAGNOSIS — Z7982 Long term (current) use of aspirin: Secondary | ICD-10-CM | POA: Insufficient documentation

## 2022-01-27 DIAGNOSIS — I10 Essential (primary) hypertension: Secondary | ICD-10-CM

## 2022-01-27 DIAGNOSIS — Z7984 Long term (current) use of oral hypoglycemic drugs: Secondary | ICD-10-CM | POA: Insufficient documentation

## 2022-01-27 LAB — LIPID PANEL
Cholesterol: 148 mg/dL (ref 0–200)
HDL: 35 mg/dL — ABNORMAL LOW (ref >40)
LDL Cholesterol: 70 mg/dL (ref 0–99)
Total CHOL/HDL Ratio: 4.2 RATIO
Triglycerides: 214 mg/dL — ABNORMAL HIGH (ref <150)
VLDL: 43 mg/dL — ABNORMAL HIGH (ref 0–40)

## 2022-01-27 LAB — COMPREHENSIVE METABOLIC PANEL
ALT: 35 U/L (ref 0–44)
AST: 25 U/L (ref 15–41)
Albumin: 3.6 g/dL (ref 3.5–5.0)
Alkaline Phosphatase: 87 U/L (ref 38–126)
Anion gap: 6 (ref 5–15)
BUN: 17 mg/dL (ref 6–20)
CO2: 25 mmol/L (ref 22–32)
Calcium: 9.5 mg/dL (ref 8.9–10.3)
Chloride: 110 mmol/L (ref 98–111)
Creatinine, Ser: 1.36 mg/dL — ABNORMAL HIGH (ref 0.61–1.24)
GFR, Estimated: >60 mL/min (ref >60)
Glucose, Bld: 136 mg/dL — ABNORMAL HIGH (ref 70–99)
Potassium: 3.8 mmol/L (ref 3.5–5.1)
Sodium: 141 mmol/L (ref 135–145)
Total Bilirubin: 1.3 mg/dL — ABNORMAL HIGH (ref 0.3–1.2)
Total Protein: 6.7 g/dL (ref 6.5–8.1)

## 2022-01-27 MED ORDER — ENTRESTO 97-103 MG PO TABS
1.0000 | ORAL_TABLET | Freq: Two times a day (BID) | ORAL | 11 refills | Status: DC
  Filled 2022-01-27: qty 60, 30d supply, fill #0

## 2022-01-27 NOTE — Patient Instructions (Addendum)
INCREASE Entresto to 97/103 mg one tab twice a day  Labs today We will only contact you if something comes back abnormal or we need to make some changes. Otherwise no news is good news!  Labs needed in 10-14 days  You have been referred to [facility] Pulmonology -they will be in contact with an appointment   Your physician recommends that you schedule a follow-up appointment in: 3 months with Dr Aundra Dubin   Do the following things EVERYDAY: Weigh yourself in the morning before breakfast. Write it down and keep it in a log. Take your medicines as prescribed Eat low salt foods--Limit salt (sodium) to 2000 mg per day.  Stay as active as you can everyday Limit all fluids for the day to less than 2 liters   At the [facility], you and your health needs are our priority. As part of our continuing mission to provide you with exceptional heart care, we have created designated Provider Care Teams. These Care Teams include your primary Cardiologist (physician) and Advanced Practice Providers (APPs- Physician Assistants and Nurse Practitioners) who all work together to provide you with the care you need, when you need it.   You may see any of the following providers on your designated Care Team at your next follow up: Dr Glori Bickers Dr Loralie Champagne Dr. Roxana Hires, NP Lyda Jester, [place] [facility] [place], [place] Forestine Na, NP Audry Riles, PharmD   Please be sure to bring in all your medications bottles to every appointment.

## 2022-01-29 ENCOUNTER — Telehealth (HOSPITAL_COMMUNITY): Payer: Self-pay | Admitting: Licensed Clinical Social Worker

## 2022-01-29 NOTE — Telephone Encounter (Signed)
H&V Care Navigation CSW Progress Note  Clinical Social Worker consulted to speak with pt regarding lack of insurance.  Pt reports he lost insurance 2 months ago after having to stop work.  Has been managing without work through his savings and by living with his mom.  Still over reserve for Medicaid per patient has like $3000.  CSW discussed CAFA and ACA as way to get coverage.  Sent pt application for CAFA and instructions on looking into ACA insurance.  Pt working with a case worker, Barnett Applebaum [phone], who is with the adult medicaid program sounds like she is following to help him when he is eligible.    SDOH Screenings   Food Insecurity: No Food Insecurity (01/27/2022)  Housing: Low Risk  (01/27/2022)  Transportation Needs: No Transportation Needs (01/27/2022)  Depression (PHQ2-9): Low Risk  (10/29/2021)  Financial Resource Strain: High Risk (01/29/2022)  Tobacco Use: Medium Risk (01/27/2022)     Jorge Ny, [facility] Desk#: [phone] Cell#: [phone]

## 2022-02-05 ENCOUNTER — Other Ambulatory Visit (HOSPITAL_COMMUNITY): Payer: Self-pay

## 2022-02-11 ENCOUNTER — Ambulatory Visit: Payer: Self-pay | Attending: Physician Assistant | Admitting: Physician Assistant

## 2022-02-11 ENCOUNTER — Other Ambulatory Visit (HOSPITAL_COMMUNITY): Payer: Self-pay

## 2022-02-11 ENCOUNTER — Encounter: Payer: Self-pay | Admitting: Physician Assistant

## 2022-02-11 VITALS — BP 143/95 | HR 62 | Ht 70.0 in | Wt 231.2 lb

## 2022-02-11 DIAGNOSIS — I2510 Atherosclerotic heart disease of native coronary artery without angina pectoris: Secondary | ICD-10-CM

## 2022-02-11 DIAGNOSIS — G4730 Sleep apnea, unspecified: Secondary | ICD-10-CM

## 2022-02-11 DIAGNOSIS — I77810 Thoracic aortic ectasia: Secondary | ICD-10-CM

## 2022-02-11 DIAGNOSIS — N183 Chronic kidney disease, stage 3 unspecified: Secondary | ICD-10-CM

## 2022-02-11 DIAGNOSIS — I10 Essential (primary) hypertension: Secondary | ICD-10-CM

## 2022-02-11 DIAGNOSIS — I5022 Chronic systolic (congestive) heart failure: Secondary | ICD-10-CM

## 2022-02-11 DIAGNOSIS — R911 Solitary pulmonary nodule: Secondary | ICD-10-CM

## 2022-02-11 DIAGNOSIS — I255 Ischemic cardiomyopathy: Secondary | ICD-10-CM

## 2022-02-11 DIAGNOSIS — I428 Other cardiomyopathies: Secondary | ICD-10-CM

## 2022-02-11 MED ORDER — DAPAGLIFLOZIN PROPANEDIOL 10 MG PO TABS
10.0000 mg | ORAL_TABLET | Freq: Every day | ORAL | 11 refills | Status: DC
  Filled 2022-02-11 – 2022-02-16 (×2): qty 30, 30d supply, fill #0

## 2022-02-11 NOTE — Progress Notes (Signed)
Cardiology Office Note    Date:  02/11/2022   ID:  DANTON PALMATEER, DOB 07-04-1970, MRN [id number]  PCP:  Jon Billings, NP  Cardiologist:  Nelva Bush, MD  Electrophysiologist:  None  Advanced Heart Failure: Aundra Dubin  Chief Complaint: Follow-up  History of Present Illness:   Clarence [name] is a 51 y.o. 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   He was admitted to the hospital in 07/2021 with progressive dyspnea over the preceding several months and was found to have a new cardiomyopathy and atypical pneumonia.  High-sensitivity troponin 89.  BNP 559.  Echo during the admission showed an EF of 30 to 35%, global hypokinesis, moderate LVH, grade 2 diastolic dysfunction, normal RV systolic function and ventricular cavity size, mildly dilated left atrium, mild to moderate mitral regurgitation, and an estimated right atrial pressure of 15 mmHg.  He was diuresed and GDMT was initiated with symptomatic improvement.  Subsequent R/LHC showed diffuse moderate to severe multivessel CAD including CTO of the mid RCA, diffuse plaquing of LAD with up to 60% stenosis in the mid vessel, 60 to 70% proximal/mid LCx stenosis, and 90% stenosis of OM1.  Moderately elevated left heart filling pressure, severely elevated right heart filling pressure, moderate to severe pulmonary hypertension, moderately reduced cardiac output/index as outlined below.  It was suspected his cardiomyopathy was a combination of ischemic and nonischemic confounded by her significant pulmonary hypertension and right heart failure that may have been due to underlying pulmonary disease.  He was seen in hospital follow up on 08/29/2021 noting fatigue and an episode of near syncope.  BP was soft at  98/76 at that time.  Labs obtained at that time showed acute on CKD and hyperkalemia with a potassium of 6.0, leading him to be evaluated in the ED on 4/14 with persistent hyperkalemia and AKI.  He was treated with IV fluids and Lokelma.   He was admitted to the hospital from 4/25 through 4/27 with acute on chronic HFrEF secondary to accelerated hypertension.  BNP 178.  Troponin peaked at 139, and felt to represent demand ischemia.  GDMT was escalated with the addition of losartan and continuation of Coreg, BiDil, Farxiga, spironolactone, and Lasix.    He was seen in the ED on 4/30 through 5/1 with weakness and near syncope.  Troponin 80.  BNP 178.  CT head not acute.  CXR with stable cardiomegaly and very mild left basilar atelectasis.  D-dimer 1.99.  CTA chest without evidence of PE.  Symptoms improved with fluids, and presentation was felt to be in the setting of soft BP following escalation of GDMT.  He was advised to stop BiDil and losartan.  Lisinopril/HCTZ was also recommended to be held, though I do not know where this medication came from, as this medication was not on his discharge summary from the prior admission.    He was seen in the office on 10/01/2021 and was without symptoms of angina or decompensation.  His main complaint was intermittent fatigue and shortness of breath.  He was without dizziness, presyncope, or syncope.  BP was stable in the 683M systolic.  He was adherent to cardiac medications.  He was referred to the advanced heart failure clinic in [place].  Given prior issues with hyperkalemia, spironolactone was again discontinued.  He was started on losartan with recommendation to rechallenge with Entresto, off spironolactone, in follow-up.   He was admitted to the hospital in 09/2021 for chest pain and hypertensive urgency.  High-sensitivity troponin peaked at 290.  Chest x-ray showed no cardiopulmonary disease.  Echo showed an EF of 35 to 40%, global hypokinesis, moderate LVH,  grade 1 diastolic dysfunction, normal RV systolic function and ventricular cavity size, mildly dilated right atrium, mild mitral regurgitation, and mild dilatation of the ascending aorta measuring 40 mm.  Renal artery ultrasound concerning for possible right renal artery stenosis.  CTA of the abdomen/pelvis showed no acute abnormality.  CTA showed divergent findings from the prior renal artery ultrasound.  There were single renal arteries bilaterally with no evidence of stenosis, FMD, or other abnormality.   He was most recently admitted to the hospital in 10/2021 with demand ischemia in the setting of hypertensive urgency.  High-sensitivity troponin peaked at 587.  BNP 300.  He was treated with 48 hours of heparin drip.  Chest x-ray showed no active cardiopulmonary disease.  He was transitioned from losartan to [facility], and carvedilol was titrated to 12.5 mg twice daily.  Rechallenge of spironolactone was deferred given prior history of hyperkalemia.   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   He was seen on 11/10/2021, at which time he was without symptoms of angina or decompensation.  Ongoing fatigue was unchanged.  Home BP readings were typically in the 154M to 086P systolic.  Entresto was titrated to 97/103 mg twice daily.  Otherwise, he was continued on 12.5 mg of carvedilol, Farxiga, and furosemide 20 mg daily.   He was evaluated by the advanced heart failure service with recommendation to titrate Lasix to 40 mg daily and start spironolactone.  Imdur was also titrated to 60 mg and Vascepa was added.  Cardiac MRI was recommended, and remains pending at this time, as he is uninsured.  I last saw him in clinic in 11/2021, at which time he was without symptoms of angina or decompensation.  He was inadvertently taking carvedilol 6.25 mg twice  daily rather than the previously noted 12.5 mg twice daily as well as Entresto 49/51 mg twice daily rather than the previously noted 97/103 mg twice daily.  He had been without furosemide for 1 week.  He did not start spironolactone following his visit with the advanced heart failure clinic.  He had forgotten to continue aspirin.  His weight was stable.  He was last seen by the advanced heart failure clinic on 01/27/2022 with recommendation to increase Entresto to 97/23 mg twice daily.  He comes in today and is without symptoms of angina or decompensation.  He does continue to note intermittent fatigue.  He spends a significant portion of his time sitting on the couch watching TV and eating snacks, and in the setting does wonder if he has been consuming more sodium.  He feels like this is what has led to his weight gain.  He does not feel volume up.  He is uncertain if he is taking certain medications.  He does not have his pills with him for review today.  He did not bring his sphygmomanometer for comparison.  No lower extremity swelling, abdominal distention, or progressive orthopnea.  No falls or symptoms concerning for bleeding.  In the office today, he reports he is  taking amlodipine initially, though is subsequently not sure.  It is also noted that Wilder Glade is not on his medication list, though I do not see where it was discontinued, and he is uncertain if he is taking this medication.   Labs independently reviewed: 01/2022 - TC 148, TG 214, HDL 35, LDL 70, potassium 3.8, BUN 17, serum creatinine 1.36, albumin 3.6, AST/ALT normal 11/2021 - Hgb 15.5, PLT 179 10/2021 - magnesium 2.0 07/2021 - A1c 5.4, TSH 0.258  Past Medical History:  Diagnosis Date   CHF (congestive heart failure) (HCC)    Chronic kidney disease    Crohn's disease ([place])    Hypertension    Proteinuria     Past Surgical History:  Procedure Laterality Date   COLECTOMY     RIGHT/LEFT HEART CATH AND CORONARY ANGIOGRAPHY N/A  08/07/2021   Procedure: RIGHT/LEFT HEART CATH AND CORONARY ANGIOGRAPHY;  Surgeon: Nelva Bush, MD;  Location: [facility];  Service: Cardiovascular;  Laterality: N/A;    Current Medications: Current Meds  Medication Sig   acetaminophen (TYLENOL) 325 MG tablet Take 650 mg by mouth at bedtime.   aspirin EC 81 MG tablet Take 81 mg by mouth daily. Swallow whole.   atorvastatin (LIPITOR) 80 MG tablet Take 1 tablet (80 mg total) by mouth every evening.   carvedilol (COREG) 6.25 MG tablet Take 1 tablet (6.25 mg total) by mouth 2 (two) times daily with a meal.   clopidogrel (PLAVIX) 75 MG tablet Take 1 tablet (75 mg total) by mouth daily.   dapagliflozin propanediol (FARXIGA) 10 MG TABS tablet Take 1 tablet (10 mg total) by mouth daily before breakfast.   ezetimibe (ZETIA) 10 MG tablet Take 1 tablet (10 mg total) by mouth daily.   famotidine (PEPCID) 10 MG tablet Take 10 mg by mouth at bedtime. As needed   furosemide (LASIX) 20 MG tablet Take 20 mg by mouth daily.   icosapent Ethyl (VASCEPA) 1 g capsule Take 2 capsules (2 g total) by mouth 2 (two) times daily.   isosorbide mononitrate (IMDUR) 60 MG 24 hr tablet Take 1 tablet (60 mg total) by mouth daily.   sacubitril-valsartan (ENTRESTO) 97-103 MG Take 1 tablet by mouth 2 (two) times daily.   [DISCONTINUED] amLODipine (NORVASC) 5 MG tablet Take 1 tablet by mouth daily.    Allergies:   Ibuprofen, Neomycin-bacitracin zn-polymyx, and Sulfacetamide   Social History   Socioeconomic History   Marital status: Single    Spouse name: Not on file   Number of children: Not on file   Years of education: Not on file   Highest education level: Not on file  Occupational History   Not on file  Tobacco Use   Smoking status: Former    Types: Cigars    Quit date: 08/13/2021    Years since quitting: 0.4   Smokeless tobacco: Not on file  Vaping Use   Vaping Use: Never used  Substance and Sexual Activity   Alcohol use: Not Currently     Alcohol/week: 1.0 standard drink of alcohol    Types: 1 Standard drinks or equivalent per week   Drug use: Never   Sexual activity: Not on file  Other Topics Concern   Not on file  Social History Narrative   Not on file   Social Determinants of Health   Financial Resource Strain: High Risk (01/29/2022)   Overall Financial Resource Strain (CARDIA)    Difficulty of Paying Living Expenses: Hard  Food Insecurity: No Food Insecurity (  01/27/2022)   Hunger Vital Sign    Worried About Running Out of Food in the Last Year: Never true    [place] in the Last Year: Never true  Transportation Needs: No Transportation Needs (01/27/2022)   PRAPARE - Hydrologist (Medical): No    Lack of Transportation (Non-Medical): No  Physical Activity: Not on file  Stress: Not on file  Social Connections: Not on file     Family History:  The patient's family history includes Cancer in his maternal aunt; Heart disease in his maternal uncle; Pancreatic cancer in his maternal uncle.  ROS:   12-point review of systems is negative unless otherwise noted in the HPI.   EKGs/Labs/Other Studies Reviewed:    Studies reviewed were summarized above. The additional studies were reviewed today:  Renal artery duplex 10/08/2021: IMPRESSION: 1. Two right-sided renal arteries, single left-sided renal artery. 2. Elevated peak systolic velocity greater than 200 centimeters/second in 1 of the 2 right renal arteries raises possibility of underlying stenosis. CT or MR arteriogram of the abdomen could further evaluate. 3. No evidence of left renal artery stenosis. 4. Normal sonographic appearance of the kidneys without evidence of hydronephrosis. __________   2D echo 10/07/2021: 1. Left ventricular ejection fraction, by estimation, is 35 to 40%. The  left ventricle has moderately decreased function. The left ventricle  demonstrates global hypokinesis. There is moderate left ventricular   hypertrophy. Left ventricular diastolic  parameters are consistent with Grade I diastolic dysfunction (impaired  relaxation).   2. Right ventricular systolic function is normal. The right ventricular  size is normal.   3. Right atrial size was mildly dilated.   4. The mitral valve is normal in structure. Mild mitral valve  regurgitation. No evidence of mitral stenosis.   5. The aortic valve is tricuspid. Aortic valve regurgitation is not  visualized. No aortic stenosis is present.   6. There is mild dilatation of the ascending aorta, measuring 40 mm. __________   [facility] 08/07/2021: Conclusions: Diffuse moderate-severe, multivessel coronary artery disease, including chronic total occlusion of mid RCA, diffuse plaquing of LAD with up to 60% stenosis in the mid vessel, 60-70% proximal/mid LCx stenosis, and 90% lesion of OM1. Moderately elevated left heart filling pressures (LVEDP/PCWP 25 mmHg). Severely elevated right heart filling pressures (mean RA 16 mmHg, RVEDP 30 mmHg). Moderate-severe pulmonary hypertension (mean PAP 42 mmHg, PVR 4.0 WU). Moderately reduced Fick cardiac output/index (CO 4.2 L/min, CI 2.0 L/min/m^2).Marland Kitchen   Recommendations: Suspect cardiomyopathy is a combination of ischemic and nonischemic etiologies confounded by significant pulmonary hypertension and right heart failure that may be due to underlying lung disease.  Recommend continued diuresis and optimization of GDMT for acute HFrEF.  In the setting of ongoing wheezing with concern for obstructive lung disease, I will hold carvedilol for now and add Entresto. Secondary prevention of coronary artery disease, including high-intensity statin therapy.  Diffuse nature of CAD is not ideal for percutaneous or surgical revascularization.  Mild troponin elevation on admission is most likely due to demand ischemia in the setting of acute HFrEF and chronic ischemic heart disease. __________   2D echo 08/05/2021:  1. Left ventricular  ejection fraction, by estimation, is 30 to 35%. The  left ventricle has moderately decreased function. The left ventricle  demonstrates global hypokinesis. There is moderate left ventricular  hypertrophy. Left ventricular diastolic  parameters are consistent with Grade II diastolic dysfunction  (pseudonormalization).   2. Right ventricular systolic function is  normal. The right ventricular  size is normal. Tricuspid regurgitation signal is inadequate for assessing  PA pressure.   3. Left atrial size was mildly dilated.   4. The mitral valve is abnormal. Mild to moderate mitral valve  regurgitation. No evidence of mitral stenosis.   5. The aortic valve has an indeterminant number of cusps. Aortic valve  regurgitation is not visualized. No aortic stenosis is present.   6. The inferior vena cava is dilated in size with <50% respiratory  variability, suggesting right atrial pressure of 15 mmHg.   EKG:  EKG is not ordered today.    Recent Labs: 08/04/2021: TSH 0.258 10/20/2021: Magnesium 2.0 11/11/2021: B Natriuretic Peptide 111.9 12/09/2021: Hemoglobin 15.5; Platelets 179 01/27/2022: ALT 35; BUN 17; Creatinine, Ser 1.36; Potassium 3.8; Sodium 141  Recent Lipid Panel    Component Value Date/Time   CHOL 148 01/27/2022 1410   TRIG 214 (H) 01/27/2022 1410   HDL 35 (L) 01/27/2022 1410   CHOLHDL 4.2 01/27/2022 1410   VLDL 43 (H) 01/27/2022 1410   LDLCALC 70 01/27/2022 1410   LDLDIRECT 69.0 09/09/2021 0329    PHYSICAL EXAM:    VS:  BP (!) 143/95 (BP Location: Left Arm, Patient Position: Sitting, Cuff Size: Normal)   Pulse 62   Ht 5' 10"  (1.778 m)   Wt 231 lb 3.2 oz (104.9 kg)   SpO2 98%   BMI 33.17 kg/m   BMI: Body mass index is 33.17 kg/m.  Physical Exam Vitals reviewed.  Constitutional:      Appearance: He is well-developed.  HENT:     Head: Normocephalic and atraumatic.  Eyes:     General:        Right eye: No discharge.        Left eye: No discharge.  Neck:      Vascular: No JVD.  Cardiovascular:     Rate and Rhythm: Normal rate and regular rhythm.     Heart sounds: Normal heart sounds, S1 normal and S2 normal. Heart sounds not distant. No midsystolic click and no opening snap. No murmur heard.    No friction rub.  Pulmonary:     Effort: Pulmonary effort is normal. No respiratory distress.     Breath sounds: Normal breath sounds. No decreased breath sounds, wheezing or rales.  Chest:     Chest wall: No tenderness.  Abdominal:     General: There is no distension.  Musculoskeletal:     Cervical back: Normal range of motion.     Right lower leg: No edema.     Left lower leg: No edema.  Skin:    General: Skin is warm and dry.     Nails: There is no clubbing.  Neurological:     Mental Status: He is alert and oriented to person, place, and time.  Psychiatric:        Speech: Speech normal.        Behavior: Behavior normal.        Thought Content: Thought content normal.        Judgment: Judgment normal.     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  Unfortunately, we cannot make medication recommendations at this time outside  of discontinuing amlodipine, if he is actually taking this medication, and reinitiating Farxiga 10 mg daily, if he is actually not taking this medication, as he is uncertain.  It is also unclear at this time if he is taking carvedilol or Entresto.  His weight is up 16 pounds by our scale, though this appears to be caloric weight secondary to sedentary lifestyle and snacking.  CHF education was discussed.  Recommend patient bring all medications he is actually taking to a work and appointment next week for our review, at that time we can provide  further recommendations regarding GDMT.  He is no longer on MRA secondary to prior hyperkalemia.  Digoxin has been deferred in the context of prior renal dysfunction.  Unable to proceed with cardiac MRI secondary to uninsured status.  Followed by the advanced heart failure clinic.  CAD involving the native coronary arteries without angina/HLD/hypertriglyceridemia: He is without symptoms of angina.  Recommend aggressive risk factor modification with continuation of aspirin, clopidogrel, atorvastatin, ezetimibe, isosorbide mononitrate, and Vascepa.  Again, it is unclear if he is taking these medications at this time.  No indication for ischemic testing currently.  CKD stage III with history of hyperkalemia: Stable on most recent check.  Would look to obtain an accurate medication list prior to providing further recommendations regarding renal function.  HTN: Blood pressure continues to run on the high side, though we cannot provide medication recommendations without an accurate medication list.  Nocturnal bradycardia with sleep disordered breathing: Previously referred to pulmonology for sleep study, though is currently uninsured.  Pulmonary nodules: Stable on repeat chest CT.  Ectatic ascending thoracic aorta: Plan for CTA of the chest/aorta in 10/2022.  Abnormal thyroid function: Follow-up with PCP.  Social determinants of health: Established with heart failure social worker with medications through heart failure phone.    Disposition: F/u with Dr. Saunders Revel or an APP in 1 week with recommendation for patient to bring all medications he is currently taking and sphygmomanometer.   Medication Adjustments/Labs and Tests Ordered: Current medicines are reviewed at length with the patient today.  Concerns regarding medicines are outlined above. Medication changes, Labs and Tests ordered today are summarized above and listed in the Patient Instructions accessible in Encounters.   Signed, Christell Faith,  PA-C 02/11/2022 2:37 PM     [address] [place], [place] [address] [phone]

## 2022-02-11 NOTE — Patient Instructions (Signed)
BRING MEDICATIONS AND BLOOD PRESSURE MACHINE TO YOUR APPOINTMENT NEXT WEEK.    Medication Instructions:  Your physician has recommended you make the following change in your medication:   STOP Amlodipine RESTART Farxiga 10 mg once a day  *If you need a refill on your cardiac medications before your next appointment, please call your pharmacy*   Lab Work: None  If you have labs (blood work) drawn today and your tests are completely normal, you will receive your results only by: [place] (if you have MyChart) OR A paper copy in the mail If you have any lab test that is abnormal or we need to change your treatment, we will call you to review the results.   Testing/Procedures: None   Follow-Up: At [facility], you and your health needs are our priority.  As part of our continuing mission to provide you with exceptional heart care, we have created designated Provider Care Teams.  These Care Teams include your primary Cardiologist (physician) and Advanced Practice Providers (APPs -  Physician Assistants and Nurse Practitioners) who all work together to provide you with the care you need, when you need it.   Your next appointment:   02/17/2022 at 08:25 am  The format for your next appointment:   In Person  Provider:   Christell Faith, PA-C        Important Information About Sugar

## 2022-02-13 ENCOUNTER — Other Ambulatory Visit (HOSPITAL_COMMUNITY): Payer: Self-pay

## 2022-02-16 ENCOUNTER — Other Ambulatory Visit (HOSPITAL_COMMUNITY): Payer: Self-pay

## 2022-02-16 ENCOUNTER — Other Ambulatory Visit
Admission: RE | Admit: 2022-02-16 | Discharge: 2022-02-16 | Disposition: A | Discharge status: Home / Self Care | Payer: Self-pay | Attending: Family Medicine | Admitting: Family Medicine

## 2022-02-16 DIAGNOSIS — I5022 Chronic systolic (congestive) heart failure: Secondary | ICD-10-CM | POA: Insufficient documentation

## 2022-02-16 LAB — BASIC METABOLIC PANEL
Anion gap: 5 (ref 5–15)
BUN: 20 mg/dL (ref 6–20)
CO2: 30 mmol/L (ref 22–32)
Calcium: 9.4 mg/dL (ref 8.9–10.3)
Chloride: 108 mmol/L (ref 98–111)
Creatinine, Ser: 1.35 mg/dL — ABNORMAL HIGH (ref 0.61–1.24)
GFR, Estimated: >60 mL/min (ref >60)
Glucose, Bld: 128 mg/dL — ABNORMAL HIGH (ref 70–99)
Potassium: 4.6 mmol/L (ref 3.5–5.1)
Sodium: 143 mmol/L (ref 135–145)

## 2022-02-17 ENCOUNTER — Telehealth: Payer: Self-pay | Admitting: Nurse Practitioner

## 2022-02-17 ENCOUNTER — Encounter: Payer: Self-pay | Admitting: Nurse Practitioner

## 2022-02-17 ENCOUNTER — Ambulatory Visit: Payer: Self-pay | Admitting: Physician Assistant

## 2022-02-17 ENCOUNTER — Ambulatory Visit: Payer: Self-pay | Attending: Physician Assistant | Admitting: Nurse Practitioner

## 2022-02-17 VITALS — BP 138/90 | HR 60 | Ht 70.0 in | Wt 226.0 lb

## 2022-02-17 DIAGNOSIS — I2510 Atherosclerotic heart disease of native coronary artery without angina pectoris: Secondary | ICD-10-CM

## 2022-02-17 DIAGNOSIS — I10 Essential (primary) hypertension: Secondary | ICD-10-CM

## 2022-02-17 DIAGNOSIS — I77810 Thoracic aortic ectasia: Secondary | ICD-10-CM

## 2022-02-17 DIAGNOSIS — N183 Chronic kidney disease, stage 3 unspecified: Secondary | ICD-10-CM

## 2022-02-17 DIAGNOSIS — I255 Ischemic cardiomyopathy: Secondary | ICD-10-CM

## 2022-02-17 DIAGNOSIS — G4730 Sleep apnea, unspecified: Secondary | ICD-10-CM

## 2022-02-17 DIAGNOSIS — I5022 Chronic systolic (congestive) heart failure: Secondary | ICD-10-CM

## 2022-02-17 DIAGNOSIS — E785 Hyperlipidemia, unspecified: Secondary | ICD-10-CM

## 2022-02-17 NOTE — Telephone Encounter (Signed)
Spoke with patient and advised he needs to call [employer] in order for them to process his application. He will need to review he has no income and they will send him letter to sign stating that  he does not have a job. He requested that I send that number and information through my chart. Sending that information and instructions to call back if he should have any questions.

## 2022-02-17 NOTE — Telephone Encounter (Signed)
Heart failure clinic processed patient assistance application. He needed to provide proof of income. Advised that he is unemployed. Reply was:    Will reach out to patient to provide number and instructions.

## 2022-02-17 NOTE — Progress Notes (Signed)
Office Visit    Patient Name: Clarence [name] Date of Encounter: 02/17/2022  Primary Care Provider:  Jon Billings, NP Primary Cardiologist:  Clarence Bush, MD  Chief Complaint    51 year old male with a history of CAD, mixed ischemic and nonischemic cardiomyopathy, chronic HFrEF (35 to 40%, May 2023), pulmonary arterial hypertension, stage II chronic kidney disease, hypertension, hyperlipidemia, and Crohn's disease status post colectomy with ostomy, who presents for follow-up related to heart failure.  Past Medical History    Past Medical History:  Diagnosis Date   Ascending Aortic Dilation    a. 09/2021 Echo: Asc Ao 63m.   CAD (coronary artery disease)    a. 07/2021 Cath: LM nl, LAD 672mD2 40, LCX 65p/m, OM1 90, RCA 10019m L-.R collats to RPDA from septal 1/2-->Med rx.   [place] ([place] with [place] fraction[place]  a. 07/2021 Echo: EF 30-35%, glob HK, mod LVH, GrII DD, nl RV fxn, mildly dil LA, mild-mod MR; b. 09/2021 Echo: EF 35-40%, glob HK, mod LVH, GrI DD, nl RV fxn, mildly dil RA, mild MR, Asc Ao 17m6m CKD (chronic kidney disease) stage 2, GFR 60-89 ml/min    Crohn's disease (HCC)[place] Hypertension    Mixed Ischemic & Nonischemic cardiomyopathy    a. 07/2021 Echo: EF 30-35%; b. 07/2021 Cath: Occluded RCA w/ mod LAD/LCX dzs, and severe OM1 dzs-->Med Rx; c. 09/2021 Echo: EF 35-40%.   PAH (pulmonary artery hypertension) (HCC)[place] a. 07/2021 RHC: PA 67/30 (42).   Proteinuria    Past Surgical History:  Procedure Laterality Date   COLECTOMY     RIGHT/LEFT HEART CATH AND CORONARY ANGIOGRAPHY N/A 08/07/2021   Procedure: RIGHT/LEFT HEART CATH AND CORONARY ANGIOGRAPHY;  Surgeon: End,Clarence [name];  [facility];  Service: Cardiovascular;  Laterality: N/A;    Allergies  Allergies  Allergen Reactions   Ibuprofen Hives and Other (See Comments)    Other reaction(s): GI Upset (intolerance) Hives Tolerates Advil  Other  reaction(s): Not available   Neomycin-Bacitracin Zn-Polymyx     Other reaction(s): Other (See Comments)   Spironolactone     Hyperkalemia   Sulfacetamide Other (See Comments)    History of Present Illness    51 y14r old male with a history of CAD, mixed ischemic and nonischemic cardiomyopathy, chronic HFrEF, pulmonary arterial hypertension, stage II chronic kidney disease, hypertension, hyperlipidemia, and Crohn's disease status post colectomy with ostomy.  He was initially admitted in March 2023 with progressive dyspnea over the preceding several months was found to have a new cardiomyopathy and atypical pneumonia.  High-sensitivity troponin peaked at 89.  Echo showed an EF of 30 to 35% with global hypokinesis and grade 2 diastolic dysfunction.  He was diuresed and GDMT was initiated.  He subsequently underwent right and left heart cardiac catheterization, which revealed an occluded RCA, severe small OM1 disease, moderate LAD and circumflex disease, and left-to-right collaterals to the RPDA.  He had elevated filling pressures with moderate to severe pulmonary hypertension [(PA 67/30 (42)] and moderately reduced cardiac output and index.  Clinical picture was consistent with a mixed ischemic and nonischemic cardiomyopathy and he was managed medically.  He was readmitted in May 2023 with chest pain and hypertensive urgency.  Repeat echo showed EF of 35 to 40%.  Renal artery duplex was concerning for possible right renal artery stenosis.  CT of the abdomen and pelvis showed no acute abnormality or significant renal artery  stenosis.  He was readmitted in June 2023, with hypertensive urgency and demand ischemia.  Clarence [name] has since been followed as an outpatient both here and in heart failure clinic in [place].  He was previously advised to undergo cardiac MRI, though this has not taken place due to lack of insurance.  At his last office visit on September 27, he had yet to take his home medications  and was not entirely sure what he was taking.  His blood pressure was elevated at 143/95 and his weight was up 16 pounds, though he was felt to be euvolemic on examination.  He was advised to follow-up today and bring all of his medications.  Today, he brought all of his medicines with him.  He has not taken any yet this morning.  He also brought his home blood pressure cuff which appears to be running about 12 to 15 mmHg above ours.  In discussing his medications, he says he has been taking Entresto 49-51 just once a day.  When I pointed out that this was increased to 97-103 twice daily, he said that he forgot he was given some samples and was instructed to call the patient assistance program.  In discussing further, he was unaware that he was previously approved for the patient assistance program.  With the exception of Farxiga, which she says he cannot afford (our staff indicates that we have filled out assistance paperwork twice and he has been approved already), he is taking all of his medications as prescribed.  He does not experience chest pain.  He has some degree of chronic, stable dyspnea on exertion and also occasionally notes dyspnea/orthopnea at night.  Though his weight is up over the past several months, he indicates that this is secondary to eating and not exercising.  He does not think that he has been holding on to abdominal fluid and has not seen any lower extremity edema.  He denies palpitations, dizziness, syncope, or early satiety.  Home Medications    Current Outpatient Medications  Medication Sig Dispense Refill   acetaminophen (TYLENOL) 325 MG tablet Take 650 mg by mouth at bedtime.     aspirin EC 81 MG tablet Take 81 mg by mouth daily. Swallow whole.     atorvastatin (LIPITOR) 80 MG tablet Take 1 tablet (80 mg total) by mouth every evening. 30 tablet 3   carvedilol (COREG) 6.25 MG tablet Take 1 tablet (6.25 mg total) by mouth 2 (two) times daily with a meal. 60 tablet 3    ezetimibe (ZETIA) 10 MG tablet Take 1 tablet (10 mg total) by mouth daily. 90 tablet 3   famotidine (PEPCID) 10 MG tablet Take 10 mg by mouth at bedtime. As needed     furosemide (LASIX) 20 MG tablet Take 20 mg by mouth daily.     icosapent Ethyl (VASCEPA) 1 g capsule Take 2 capsules (2 g total) by mouth 2 (two) times daily. 180 capsule 3   isosorbide mononitrate (IMDUR) 60 MG 24 hr tablet Take 1 tablet (60 mg total) by mouth daily. 30 tablet 3   sacubitril-valsartan (ENTRESTO) 97-103 MG Take 1 tablet by mouth 2 (two) times daily. 60 tablet 11   clopidogrel (PLAVIX) 75 MG tablet Take 1 tablet (75 mg total) by mouth daily. 30 tablet 3   No current facility-administered medications for this visit.     Review of Systems    Stable dyspnea on exertion with occasional orthopneic episodes.  He denies chest pain, palpitations, PND,  dizziness, syncope, edema, or early satiety.  All other systems reviewed and are otherwise negative except as noted above.    Physical Exam    VS:  BP (!) 138/90 (BP Location: Left Arm, Patient Position: Sitting, Cuff Size: Normal)   Pulse 60   Ht 5' 10"  (1.778 m)   Wt 226 lb (102.5 kg)   SpO2 96%   BMI 32.43 kg/m  , BMI Body mass index is 32.43 kg/m.     GEN: Well nourished, well developed, in no acute distress. HEENT: normal. Neck: Supple, no JVD, carotid bruits, or masses. Cardiac: RRR, no murmurs, rubs, or gallops. No clubbing, cyanosis, edema.  Radials/PT 2+ and equal bilaterally.  Respiratory:  Respirations regular and unlabored, clear to auscultation bilaterally. GI: Soft, nontender, nondistended, BS + x 4. MS: no deformity or atrophy. Skin: warm and dry, no rash. Neuro:  Strength and sensation are intact. Psych: Normal affect.  Accessory Clinical Findings    Lab Results  Component Value Date   WBC 8.3 12/09/2021   HGB 15.5 12/09/2021   HCT 46.2 12/09/2021   MCV 85.4 12/09/2021   PLT 179 12/09/2021   Lab Results  Component Value Date    CREATININE 1.35 (H) 02/16/2022   BUN 20 02/16/2022   NA 143 02/16/2022   K 4.6 02/16/2022   CL 108 02/16/2022   CO2 30 02/16/2022   Lab Results  Component Value Date   ALT 35 01/27/2022   AST 25 01/27/2022   ALKPHOS 87 01/27/2022   BILITOT 1.3 (H) 01/27/2022   Lab Results  Component Value Date   CHOL 148 01/27/2022   HDL 35 (L) 01/27/2022   LDLCALC 70 01/27/2022   LDLDIRECT 69.0 09/09/2021   TRIG 214 (H) 01/27/2022   CHOLHDL 4.2 01/27/2022    Lab Results  Component Value Date   HGBA1C 5.4 08/05/2021   Lab Results  Component Value Date   TSH 0.258 (L) 08/04/2021    Assessment & Plan    1.  Chronic heart failure with reduced ejection fraction/mixed ischemic and nonischemic cardiomyopathy: EF 30 to 35% with global hypokinesis by echo in March with follow-up of 35 to 40% in May.  Cath in March showed an occluded RCA with moderate disease involving the LAD and circumflex as well as more severe disease in a small OM1.  He has left-to-right collaterals and has been medically managed.  Medical management has been complicated by lack of insurance, some degree of patient misunderstanding, and noncompliance.  He brought all of his medications with him today.  Despite weight gain over the past few months, he is euvolemic on examination and currently feels well though based on his description, he does seem to occasionally have orthopnea.  He admits to not watching his sodium intake particularly closely.  He has only been taking Entresto once a day and at that, has only been taking half of the prescribed dose.  We discussed this today.  He will begin taking Entresto 97-103 mg twice daily.  He was previously advised to contact the assistance program as he was already approved and he says he will call that number today.  He is otherwise managed with carvedilol and Imdur.  Wilder Glade was previously prescribed and paperwork filled out however, he is not taking.  As previously noted, in the absence of  insurance, he is not able to obtain a cardiac MRI.  2.  Coronary artery disease: As above, diagnostic catheterization March showed an occluded RCA with moderate disease involving  the LAD and circumflex as well as more severe disease in a small OM1.  He has left-to-right collaterals and has been medically managed.  He has not been experiencing chest pain recently.  He remains on aspirin, statin, beta-blocker, Plavix, Zetia, Vascepa, and nitrate therapy.  3.  Stage III chronic kidney disease with history of hyperkalemia: Just had labs yesterday with stable renal function and potassium.  4.  Essential hypertension: Brought his cuff with him today.  Tends to run 12 to 15 mmHg high.  130/90 on our cuff and he has not taken his medication yet.  Has not been taking Entresto as prescribed and will begin to.  5.  Nocturnal bradycardia with sleep disordered breathing: He has follow-up with pulmonology discussed sleep study though he notes that he is uninsured and is not likely to pursue sleep study at this time.  6.  Ectatic ascending thoracic aorta: We will need follow-up CTA of the chest and aorta in June 2024.  7.  Pulmonary nodules: Stable on CT earlier this year.  8.  Abnormal thyroid function: TSH of 0.258 in March.  He has been advised to follow-up his primary care provider related to this though it does not appear that he has yet  9.  HL: LDL of 70 earlier this month.  Continue statin, Zetia, and Vascepa therapy (triglycerides 214).    10.  Disposition: Patient will follow-up in 3 weeks as scheduled.  Murray Hodgkins, NP 02/17/2022, 1:19 PM

## 2022-02-17 NOTE — Patient Instructions (Signed)
Medication Instructions:  - Your physician has recommended you make the following change in your medication:   1) TAKE Entresto 97/103 mg: - take 1 tablet by mouth TWICE daily   *If you need a refill on your cardiac medications before your next appointment, please call your pharmacy*   Lab Work: - none ordered  If you have labs (blood work) drawn today and your tests are completely normal, you will receive your results only by: [facility] (if you have MyChart) OR A paper copy in the mail If you have any lab test that is abnormal or we need to change your treatment, we will call you to review the results.   Testing/Procedures: - none ordered   Follow-Up: At [facility], you and your health needs are our priority.  As part of our continuing mission to provide you with exceptional heart care, we have created designated Provider Care Teams.  These Care Teams include your primary Cardiologist (physician) and Advanced Practice Providers (APPs -  Physician Assistants and Nurse Practitioners) who all work together to provide you with the care you need, when you need it.  We recommend signing up for the patient portal called "MyChart".  Sign up information is provided on this After Visit Summary.  MyChart is used to connect with patients for Virtual Visits (Telemedicine).  Patients are able to view lab/test results, encounter notes, upcoming appointments, etc.  Non-urgent messages can be sent to your provider as well.   To learn more about what you can do with MyChart, go to [email or web address].    Your next appointment:   As scheduled    The format for your next appointment:   In Person  Provider:   Nelva Bush, MD    Other Instructions N/a  Important Information About Sugar

## 2022-02-18 ENCOUNTER — Ambulatory Visit (INDEPENDENT_AMBULATORY_CARE_PROVIDER_SITE_OTHER): Payer: Self-pay | Admitting: Pulmonary Disease

## 2022-02-18 ENCOUNTER — Encounter: Payer: Self-pay | Admitting: Pulmonary Disease

## 2022-02-18 VITALS — BP 124/80 | HR 68 | Ht 70.0 in | Wt 226.0 lb

## 2022-02-18 DIAGNOSIS — G4719 Other hypersomnia: Secondary | ICD-10-CM

## 2022-02-18 DIAGNOSIS — R0609 Other forms of dyspnea: Secondary | ICD-10-CM

## 2022-02-18 NOTE — Progress Notes (Signed)
Clarence [name]    [id number]    08-08-1970  Primary Care Physician:[name], Clarence Glad, NP  Referring Physician: [name], [name] [address],  Clarence [name]  Chief complaint:   Sleep disordered breathing Daytime fatigue Pulmonary hypertension  HPI:  Patient with shortness of breath at night Sometimes not able to sleep well without elevating the head of the bed Has a history of cardiomyopathy, had a heart attack in March Was found to have reduced ejection fraction of 35 to 40%, noted to have pulmonary hypertension  He has a history of snoring, no witnessed apneas Some dryness of his mouth in the morning Usually goes to bed about 12 AM to 1 AM Takes him about 5 to 20 minutes to fall asleep Wakes up several times during the night Final wake up time about 10 AM  Quit smoking in March 2023 No known to have any lung disease  Works with a funeral service previously He is very short of breath with activity  Not able to walk 100 yards sometimes  Outpatient Encounter Medications as of 02/18/2022  Medication Sig   acetaminophen (TYLENOL) 325 MG tablet Take 650 mg by mouth at bedtime.   aspirin EC 81 MG tablet Take 81 mg by mouth daily. Swallow whole.   atorvastatin (LIPITOR) 80 MG tablet Take 1 tablet (80 mg total) by mouth every evening.   carvedilol (COREG) 6.25 MG tablet Take 1 tablet (6.25 mg total) by mouth 2 (two) times daily with a meal.   clopidogrel (PLAVIX) 75 MG tablet Take 1 tablet (75 mg total) by mouth daily.   ezetimibe (ZETIA) 10 MG tablet Take 1 tablet (10 mg total) by mouth daily.   famotidine (PEPCID) 10 MG tablet Take 10 mg by mouth at bedtime. As needed   furosemide (LASIX) 20 MG tablet Take 20 mg by mouth daily.   icosapent Ethyl (VASCEPA) 1 g capsule Take 2 capsules (2 g total) by mouth 2 (two) times daily.   isosorbide mononitrate (IMDUR) 60 MG 24 hr tablet Take 1 tablet (60 mg total) by mouth daily.   sacubitril-valsartan  (ENTRESTO) 97-103 MG Take 1 tablet by mouth 2 (two) times daily.   [DISCONTINUED] dapagliflozin propanediol (FARXIGA) 10 MG TABS tablet Take 1 tablet (10 mg total) by mouth daily before breakfast. (Patient not taking: Reported on 02/17/2022)   No facility-administered encounter medications on file as of 02/18/2022.    Allergies as of 02/18/2022 - Review Complete 02/18/2022  Allergen Reaction Noted   Ibuprofen Hives and Other (See Comments) 09/08/2012   Spironolactone  02/17/2022   Sulfacetamide Other (See Comments) 09/08/2012   Neomycin-bacitracin zn-polymyx  09/08/2012    Past Medical History:  Diagnosis Date   Ascending Aortic Dilation    a. 09/2021 Echo: Asc Ao 56m.   CAD (coronary artery disease)    a. 07/2021 Cath: LM nl, LAD 691mD2 40, LCX 65p/m, OM1 90, RCA 10049m L-.R collats to RPDA from septal 1/2-->Med rx.   Chronic HFrEF (heart failure with reduced [place] fraction) [place]  a. 07/2021 Echo: EF 30-35%, glob HK, mod LVH, GrII DD, nl RV fxn, mildly dil LA, mild-mod MR; b. 09/2021 Echo: EF 35-40%, glob HK, mod LVH, GrI DD, nl RV fxn, mildly dil RA, mild MR, Asc Ao 13m71m CKD (chronic kidney disease) stage 2, GFR 60-89 ml/min    Crohn's disease (HCC)[place] Hypertension    Mixed Ischemic &  Nonischemic cardiomyopathy    a. 07/2021 Echo: EF 30-35%; b. 07/2021 Cath: Occluded RCA w/ mod LAD/LCX dzs, and severe OM1 dzs-->Med Rx; c. 09/2021 Echo: EF 35-40%.   PAH (pulmonary artery hypertension) ([place])    a. 07/2021 RHC: PA 67/30 (42).   Proteinuria     Past Surgical History:  Procedure Laterality Date   COLECTOMY     RIGHT/LEFT HEART CATH AND CORONARY ANGIOGRAPHY N/A 08/07/2021   Procedure: RIGHT/LEFT HEART CATH AND CORONARY ANGIOGRAPHY;  Surgeon: Nelva Bush, MD;  Location: [facility];  Service: Cardiovascular;  Laterality: N/A;    Family History  Problem Relation Age of Onset   Cancer Maternal Aunt    Heart disease Maternal Uncle    Pancreatic cancer Maternal Uncle      Social History   Socioeconomic History   Marital status: Single    Spouse name: Not on file   Number of children: Not on file   Years of education: Not on file   Highest education level: Not on file  Occupational History   Not on file  Tobacco Use   Smoking status: Former    Types: Cigars    Quit date: 08/13/2021    Years since quitting: 0.5   Smokeless tobacco: Not on file  Vaping Use   Vaping Use: Never used  Substance and Sexual Activity   Alcohol use: Not Currently    Alcohol/week: 1.0 standard drink of alcohol    Types: 1 Standard drinks or equivalent per week   Drug use: Never   Sexual activity: Not on file  Other Topics Concern   Not on file  Social History Narrative   Not on file   Social Determinants of Health   Financial Resource Strain: High Risk (01/29/2022)   Overall Financial Resource Strain (CARDIA)    Difficulty of Paying Living Expenses: Hard  Food Insecurity: No Food Insecurity (01/27/2022)   Hunger Vital Sign    Worried About Running Out of Food in the Last Year: Never true    [place] in the Last Year: Never true  Transportation Needs: No Transportation Needs (01/27/2022)   PRAPARE - Hydrologist (Medical): No    Lack of Transportation (Non-Medical): No  Physical Activity: Not on file  Stress: Not on file  Social Connections: Not on file  Intimate Partner Violence: Not on file    Review of Systems  Constitutional:  Positive for fatigue.  Respiratory:  Positive for shortness of breath.   Psychiatric/Behavioral:  Positive for sleep disturbance.     Vitals:   02/18/22 0928  BP: 124/80  Pulse: 68  SpO2: 97%     Physical Exam Constitutional:      Appearance: He is obese.  HENT:     Head: Normocephalic.     Mouth/Throat:     Mouth: Mucous membranes are moist.  Cardiovascular:     Rate and Rhythm: Normal rate and regular rhythm.     Heart sounds: No murmur heard.    No friction rub.  Pulmonary:      Effort: No respiratory distress.     Breath sounds: No stridor. No wheezing or rhonchi.  Musculoskeletal:     Cervical back: No rigidity or tenderness.  Neurological:     Mental Status: He is alert.  Psychiatric:        Mood and Affect: Mood normal.      02/18/2022    9:00 AM 10/31/2021    2:00  PM  Results of the Epworth flowsheet  Sitting and reading 3 3  Watching TV 3 3  Sitting, inactive in a public place (e.g. a theatre or a meeting) 0 2  As a passenger in a car for an hour without a break 0 3  Lying down to rest in the afternoon when circumstances permit 3 3  Sitting and talking to someone 0 1  Sitting quietly after a lunch without alcohol 0 2  In a car, while stopped for a few minutes in traffic 0 0  Total score 9 17   Data Reviewed: Left ventricular ejection fraction of 35 to 43%, diastolic dysfunction  Cardiac catheterization 08/07/2021 with evidence of pulmonary hypertension with a mean pulmonary artery pressure of 42.  Multivessel coronary artery disease chronic total occlusion of mid RCA  Assessment:  Concern for obstructive sleep apnea -Patient was scheduled for a sleep study in the past that was not followed up on  Needs a sleep study to confirm presence of significant obstructive sleep apnea and therapeutic options was discussed today  History of significant coronary artery disease cardiomyopathy  Past history of smoking, concern for obstructive lung disease  Pulmonary hypertension-likely secondary to significant heart disease, will need to rule out significant lung disease and nocturnal hypoxemia contributing to pulmonary hypertension  Plan/Recommendations: Obtain a pulmonary function test  Schedule patient for split-night sleep study  Graded exercise as tolerated  Risks with not treating sleep disordered breathing was discussed with the patient  How sleep apnea may impact his heart disease also discussed with him   Tentative follow-up in 3  months  Encouraged to call with any significant concerns   Sherrilyn Rist MD [facility] 02/18/2022, 9:57 AM  CC: Clarence Bihari, [name]

## 2022-02-18 NOTE — Patient Instructions (Signed)
Schedule for in lab split-night study  Schedule for pulmonary function test  Continue follow-up with cardiology  Graded exercise as tolerated  It is important to make sure we treat sleep disordered breathing if this is contributing to your heart problems  Follow-up in 3 months

## 2022-02-25 ENCOUNTER — Other Ambulatory Visit (HOSPITAL_COMMUNITY): Payer: Self-pay

## 2022-03-05 NOTE — Progress Notes (Signed)
Requested PCP notes.

## 2022-03-11 ENCOUNTER — Other Ambulatory Visit (HOSPITAL_COMMUNITY): Payer: Self-pay

## 2022-03-11 ENCOUNTER — Encounter: Payer: Self-pay | Admitting: Internal Medicine

## 2022-03-11 ENCOUNTER — Ambulatory Visit: Payer: Self-pay | Attending: Internal Medicine | Admitting: Internal Medicine

## 2022-03-11 VITALS — BP 140/96 | HR 81 | Ht 70.0 in | Wt 227.0 lb

## 2022-03-11 DIAGNOSIS — I5022 Chronic systolic (congestive) heart failure: Secondary | ICD-10-CM

## 2022-03-11 DIAGNOSIS — E782 Mixed hyperlipidemia: Secondary | ICD-10-CM

## 2022-03-11 DIAGNOSIS — I25118 Atherosclerotic heart disease of native coronary artery with other forms of angina pectoris: Secondary | ICD-10-CM

## 2022-03-11 MED ORDER — FUROSEMIDE 20 MG PO TABS
20.0000 mg | ORAL_TABLET | Freq: Every day | ORAL | 1 refills | Status: DC
  Filled 2022-03-11: qty 30, 30d supply, fill #0
  Filled 2022-04-15: qty 30, 30d supply, fill #1

## 2022-03-11 NOTE — Patient Instructions (Signed)
Medication Instructions:   Your physician recommends that you continue on your current medications as directed. Please refer to the Current Medication list given to you today.   RESTART your Furosemide (Lasix) 20 MG once a day, refills have been sent in for you.  *If you need a refill on your cardiac medications before your next appointment, please call your pharmacy*    Follow-Up: At [facility], you and your health needs are our priority.  As part of our continuing mission to provide you with exceptional heart care, we have created designated Provider Care Teams.  These Care Teams include your primary Cardiologist (physician) and Advanced Practice Providers (APPs -  Physician Assistants and Nurse Practitioners) who all work together to provide you with the care you need, when you need it.  We recommend signing up for the patient portal called "MyChart".  Sign up information is provided on this After Visit Summary.  MyChart is used to connect with patients for Virtual Visits (Telemedicine).  Patients are able to view lab/test results, encounter notes, upcoming appointments, etc.  Non-urgent messages can be sent to your provider as well.   To learn more about what you can do with MyChart, go to [email or web address].    Your next appointment:   3-4 week(s)  The format for your next appointment:   In Person  Provider:   You may see Nelva Bush, MD or one of the following Advanced Practice Providers on your designated Care Team:   Murray Hodgkins, NP Christell Faith, PA-C Cadence Kathlen Mody, PA-C Gerrie Nordmann, NP    Other Instructions   Important Information About Sugar

## 2022-03-11 NOTE — Telephone Encounter (Signed)
Advanced Heart Failure Patient Advocate Encounter  [employer] has not received POI as of 03/11/2022.  Spoke to patient on the phone, he will be contacting [employer] for [employer], and will return letter to [employer], or contact the office if he has any issues.  New encounter from [facility] on 10/03 is also following this application process and may have future updates in their chart notes as well.  Clista Bernhardt, CPhT Rx Patient Advocate Phone: [phone]

## 2022-03-11 NOTE — Progress Notes (Unsigned)
Follow-up Outpatient Visit Date: 03/11/2022  Primary Care Provider: Jon Billings, NP [place] [address]  Chief Complaint: Follow-up HFrEF and CAD  HPI:  Mr. Mapel is a 51 y.o. male with history of CAD, mixed ischemic and nonischemic cardiomyopathy, chronic HFrEF (35 to 40%, May 2023), pulmonary arterial hypertension, stage II chronic kidney disease, hypertension, hyperlipidemia, and Crohn's disease status post colectomy with ostomy, who presents for follow-up of CAD and HFrEF.  He was last seen in our office about 3 weeks ago by Ignacia Bayley, NP, he reported stable exertional dyspnea.  He was not taking all of his heart failure medications as prescribed, as he reported some issues affording medications (specifically sacubitril-valsartan and dapagliflozin though he was previously approved for patient assistance for both agents).  He had been unable to proceed with cardiac MRI due to insurance issues.  He was advised to take sacubitril-valsartan as prescribed (97/103 mg twice daily).  He is comanaged with the heart failure team and is scheduled to see Dr. Aundra Dubin again in mid December.  Today, Mr. Arko reports that he has been feeling chest discomfort.  He feels like his rib cage is being squeezed when he lies on either side.  He does not have any discomfort when lying on his back or standing.  He does not have exertional chest pain either though he gets short of breath walking from one room to the other.  He has not had any edema or weight gain.  However, he notes that he ran out of furosemide a few weeks ago and has not been able to check with his pharmacy if another supply is available.  He has been taking carvedilol (albeit at 12.5 mg twice daily as opposed to the 6.25 mg twice daily listed in our prior notes), sacubitril-valsartan, and dapagliflozin.  He also takes isosorbide mononitrate for antianginal therapy as well as aspirin, Vascepa, and atorvastatin for secondary  prevention.  He is in the process of trying to procure financial assistance for cardiac MRI though he has not yet returned paperwork for this.  --------------------------------------------------------------------------------------------------  Past Medical History:  Diagnosis Date   Ascending Aortic Dilation    a. 09/2021 Echo: Asc Ao 26m.   CAD (coronary artery disease)    a. 07/2021 Cath: LM nl, LAD 656mD2 40, LCX 65p/m, OM1 90, RCA 10036m L-.R collats to RPDA from septal 1/2-->Med rx.   Chronic [place]EF (heart failure with reduced ejection fraction) [place]  a. 07/2021 Echo: EF 30-35%, glob HK, mod LVH, GrII DD, nl RV fxn, mildly dil LA, mild-mod MR; b. 09/2021 Echo: EF 35-40%, glob HK, mod LVH, GrI DD, nl RV fxn, mildly dil RA, mild MR, Asc Ao 14m75m CKD (chronic kidney disease) stage 2, GFR 60-89 ml/min    Crohn's disease (HCC)[place] Hypertension    Mixed Ischemic & Nonischemic cardiomyopathy    a. 07/2021 Echo: EF 30-35%; b. 07/2021 Cath: Occluded RCA w/ mod LAD/LCX dzs, and severe OM1 dzs-->Med Rx; c. 09/2021 Echo: EF 35-40%.   PAH (pulmonary artery hypertension) (HCC)[place] a. 07/2021 RHC: PA 67/30 (42).   Proteinuria    Past Surgical History:  Procedure Laterality Date   COLECTOMY     RIGHT/LEFT HEART CATH AND CORONARY ANGIOGRAPHY N/A 08/07/2021   Procedure: RIGHT/LEFT HEART CATH AND CORONARY ANGIOGRAPHY;  Surgeon: Lorie Melichar,Nelva Bush;  [facility];  Service: Cardiovascular;  Laterality: N/A;    Current Meds  Medication Sig   acetaminophen (TYLENOL) 325 MG tablet Take 650 mg by mouth at bedtime.   aspirin EC 81 MG tablet Take 81 mg by mouth daily. Swallow whole.   atorvastatin (LIPITOR) 80 MG tablet Take 1 tablet (80 mg total) by mouth every evening.   carvedilol (COREG) 12.5 MG tablet Take 12.5 mg by mouth 2 (two) times daily with a meal.   clopidogrel (PLAVIX) 75 MG tablet Take 1 tablet (75 mg total) by mouth daily.   dapagliflozin propanediol (FARXIGA) 10 MG TABS  tablet Take 10 mg by mouth daily.   ezetimibe (ZETIA) 10 MG tablet Take 1 tablet (10 mg total) by mouth daily.   famotidine (PEPCID) 10 MG tablet Take 10 mg by mouth at bedtime. As needed   furosemide (LASIX) 20 MG tablet Take 20 mg by mouth daily.   icosapent Ethyl (VASCEPA) 1 g capsule Take 2 capsules (2 g total) by mouth 2 (two) times daily.   isosorbide mononitrate (IMDUR) 60 MG 24 hr tablet Take 1 tablet (60 mg total) by mouth daily.   sacubitril-valsartan (ENTRESTO) 97-103 MG Take 1 tablet by mouth 2 (two) times daily.   [DISCONTINUED] carvedilol (COREG) 6.25 MG tablet Take 1 tablet (6.25 mg total) by mouth 2 (two) times daily with a meal.    Allergies: Ibuprofen, Spironolactone, Sulfacetamide, and Neomycin-bacitracin zn-polymyx  Social History   Tobacco Use   Smoking status: Former    Types: Cigars    Quit date: 08/13/2021    Years since quitting: 0.5  Vaping Use   Vaping Use: Never used  Substance Use Topics   Alcohol use: Not Currently    Alcohol/week: 1.0 standard drink of alcohol    Types: 1 Standard drinks or equivalent per week   Drug use: Never    Family History  Problem Relation Age of Onset   Cancer Maternal Aunt    Heart disease Maternal Uncle    Pancreatic cancer Maternal Uncle     Review of Systems: A 12-system review of systems was performed and was negative except as noted in the HPI.  --------------------------------------------------------------------------------------------------  Physical Exam: BP (!) 140/96 (BP Location: Left Arm)   Pulse 81   Ht 5' 10"  (1.778 m)   Wt 227 lb (103 kg)   SpO2 98%   BMI 32.57 kg/m  Repeat BP: 140/96  General:  NAD. Neck: No JVD or HJR. Lungs: Clear to auscultation bilaterally without wheezes or crackles. Heart: Regular rate and rhythm without murmurs, rubs, or gallops. Abdomen: Soft, nontender, nondistended. Extremities: No lower extremity edema.  EKG: Normal sinus rhythm with left ventricular hypertrophy  and inferior infarct.  Heart rate has increased from 12/09/2021.  Otherwise, no significant interval change.  Lab Results  Component Value Date   WBC 8.3 12/09/2021   HGB 15.5 12/09/2021   HCT 46.2 12/09/2021   MCV 85.4 12/09/2021   PLT 179 12/09/2021    Lab Results  Component Value Date   NA 143 02/16/2022   K 4.6 02/16/2022   CL 108 02/16/2022   CO2 30 02/16/2022   BUN 20 02/16/2022   CREATININE 1.35 (H) 02/16/2022   GLUCOSE 128 (H) 02/16/2022   ALT 35 01/27/2022    Lab Results  Component Value Date   CHOL 148 01/27/2022   HDL 35 (L) 01/27/2022   LDLCALC 70 01/27/2022   LDLDIRECT 69.0 09/09/2021   TRIG 214 (H) 01/27/2022   CHOLHDL 4.2 01/27/2022    --------------------------------------------------------------------------------------------------  ASSESSMENT AND PLAN: Coronary artery disease with  stable angina: Mr. Sliker reports frequent chest pain that is positional, usually when lying on his side.  He does not have any exertional chest discomfort but has significant DOE.  This is likely a combination of his severe HFrEF and underlying coronary insufficiency.  I again reviewed his coronary angiograms from March and do not see any good targets for PCI.  I agree that cardiac MRI could be helpful to see if the inferior wall is viable and guide future revascularization.  We will continue current antianginal therapy consisting of carvedilol and isosorbide mononitrate as well as aggressive secondary prevention with aspirin, clopidogrel, atorvastatin, and Vascepa.  Chronic HFrEF: Mr. Walthall appears euvolemic with stable weight though he has been off his furosemide for a few weeks.  We will send in a new prescription today.  He has a history of medication noncompliance and I question if he is taking his medications regularly as evidenced by his increased heart rate today compared to March despite dose escalation of carvedilol.  We did not make any changes today to his current  doses of carvedilol, dapagliflozin, sacubitril-valsartan.  I encouraged Mr. Vantine to submit paperwork to help facilitate financial assistance for cardiac MRI.  Hyperlipidemia: LDL borderline on last check in September.  Triglycerides mildly elevated.  We will plan to continue high-dose atorvastatin and Vascepa for now.  Hypertension: Blood pressure elevated today.  We will restart furosemide but continue remainder of current medications.  I have stressed the importance of medication compliance as inconsistent dosing/scheduling has been an ongoing issue.  Return to clinic in 3-4 weeks with [place].  Keep previously scheduled appointment with Dr. Aundra Dubin in the heart failure clinic in December.  Nelva Bush, MD 03/11/2022 4:43 PM

## 2022-03-12 ENCOUNTER — Encounter: Payer: Self-pay | Admitting: Internal Medicine

## 2022-03-12 DIAGNOSIS — I25118 Atherosclerotic heart disease of native coronary artery with other forms of angina pectoris: Secondary | ICD-10-CM | POA: Insufficient documentation

## 2022-03-12 DIAGNOSIS — E782 Mixed hyperlipidemia: Secondary | ICD-10-CM | POA: Insufficient documentation

## 2022-03-12 DIAGNOSIS — I25110 Atherosclerotic heart disease of native coronary artery with unstable angina pectoris: Secondary | ICD-10-CM | POA: Insufficient documentation

## 2022-03-16 NOTE — Addendum Note (Signed)
Addended by: Raelene Bott, Berma Harts L on: 03/16/2022 02:42 PM   Modules accepted: Orders

## 2022-03-22 ENCOUNTER — Ambulatory Visit (HOSPITAL_BASED_OUTPATIENT_CLINIC_OR_DEPARTMENT_OTHER): Payer: Medicaid Other | Attending: Pulmonary Disease | Admitting: Pulmonary Disease

## 2022-03-22 DIAGNOSIS — G4719 Other hypersomnia: Secondary | ICD-10-CM | POA: Insufficient documentation

## 2022-03-23 ENCOUNTER — Other Ambulatory Visit (HOSPITAL_COMMUNITY): Payer: Self-pay

## 2022-03-24 NOTE — Telephone Encounter (Signed)
Advanced Heart Failure Patient Advocate Encounter  Called and left patient vm requesting an update on attestation letter. This is the last step to getting the patient approved for assistance. Left phone number for [employer] to request attestation and also our fax number here if he chooses to forward it to the office.  Will be here to assist in the future, as needed.  Charlann Boxer, CPhT

## 2022-04-01 ENCOUNTER — Encounter: Payer: Self-pay | Admitting: Internal Medicine

## 2022-04-01 ENCOUNTER — Ambulatory Visit: Payer: Self-pay | Admitting: Nurse Practitioner

## 2022-04-01 ENCOUNTER — Ambulatory Visit: Payer: Medicaid Other | Attending: Nurse Practitioner | Admitting: Nurse Practitioner

## 2022-04-01 ENCOUNTER — Encounter: Payer: Self-pay | Admitting: Nurse Practitioner

## 2022-04-01 ENCOUNTER — Other Ambulatory Visit
Admission: RE | Admit: 2022-04-01 | Discharge: 2022-04-01 | Disposition: A | Discharge status: Home / Self Care | Payer: Medicaid Other | Source: Ambulatory Visit | Attending: Nurse Practitioner | Admitting: Nurse Practitioner

## 2022-04-01 ENCOUNTER — Encounter: Payer: Self-pay | Admitting: Family

## 2022-04-01 VITALS — BP 134/87 | HR 72 | Ht 70.0 in | Wt 230.5 lb

## 2022-04-01 DIAGNOSIS — I25119 Atherosclerotic heart disease of native coronary artery with unspecified angina pectoris: Secondary | ICD-10-CM

## 2022-04-01 DIAGNOSIS — E785 Hyperlipidemia, unspecified: Secondary | ICD-10-CM

## 2022-04-01 DIAGNOSIS — I10 Essential (primary) hypertension: Secondary | ICD-10-CM

## 2022-04-01 DIAGNOSIS — I5022 Chronic systolic (congestive) heart failure: Secondary | ICD-10-CM | POA: Insufficient documentation

## 2022-04-01 DIAGNOSIS — I255 Ischemic cardiomyopathy: Secondary | ICD-10-CM | POA: Diagnosis not present

## 2022-04-01 DIAGNOSIS — I77810 Thoracic aortic ectasia: Secondary | ICD-10-CM

## 2022-04-01 DIAGNOSIS — G4730 Sleep apnea, unspecified: Secondary | ICD-10-CM

## 2022-04-01 DIAGNOSIS — R911 Solitary pulmonary nodule: Secondary | ICD-10-CM | POA: Diagnosis not present

## 2022-04-01 LAB — BASIC METABOLIC PANEL
Anion gap: 9 (ref 5–15)
BUN: 18 mg/dL (ref 6–20)
CO2: 28 mmol/L (ref 22–32)
Calcium: 9.3 mg/dL (ref 8.9–10.3)
Chloride: 105 mmol/L (ref 98–111)
Creatinine, Ser: 1.27 mg/dL — ABNORMAL HIGH (ref 0.61–1.24)
GFR, Estimated: >60 mL/min (ref >60)
Glucose, Bld: 119 mg/dL — ABNORMAL HIGH (ref 70–99)
Potassium: 4.1 mmol/L (ref 3.5–5.1)
Sodium: 142 mmol/L (ref 135–145)

## 2022-04-01 NOTE — Patient Instructions (Signed)
Medication Instructions:   Your physician recommends that you continue on your current medications as directed. Please refer to the Current Medication list given to you today.  *If you need a refill on your cardiac medications before your next appointment, please call your pharmacy*   Lab Work:  Your physician recommends you have lab work completed at the medical mall.   If you have labs (blood work) drawn today and your tests are completely normal, you will receive your results only by: [place] (if you have MyChart) OR A paper copy in the mail If you have any lab test that is abnormal or we need to change your treatment, we will call you to review the results.   Testing/Procedures:  None Ordered   Follow-Up: At [facility], you and your health needs are our priority.  As part of our continuing mission to provide you with exceptional heart care, we have created designated Provider Care Teams.  These Care Teams include your primary Cardiologist (physician) and Advanced Practice Providers (APPs -  Physician Assistants and Nurse Practitioners) who all work together to provide you with the care you need, when you need it.  We recommend signing up for the patient portal called "MyChart".  Sign up information is provided on this After Visit Summary.  MyChart is used to connect with patients for Virtual Visits (Telemedicine).  Patients are able to view lab/test results, encounter notes, upcoming appointments, etc.  Non-urgent messages can be sent to your provider as well.   To learn more about what you can do with MyChart, go to [email or web address].    Your next appointment:   2 - 3 month(s)  The format for your next appointment:   In Person  Provider:   You may see Nelva Bush, MD or one of the following Advanced Practice Providers on your designated Care Team:   Murray Hodgkins, NP Christell Faith, PA-C Cadence Kathlen Mody, PA-C Gerrie Nordmann, NP

## 2022-04-01 NOTE — Progress Notes (Signed)
Office Visit    Patient Name: Clarence [name] Date of Encounter: 04/01/2022  Primary Care Provider:  Jon Billings, NP Primary Cardiologist:  Clarence Bush, MD  Chief Complaint    51 year old male with a history of CAD, mixed ischemic and nonischemic cardiomyopathy, chronic HFrEF (EF 35 to 40% May 2023), pulmonary arterial hypertension, stage II chronic kidney disease, hypertension, hyperlipidemia, and Crohn's disease status post colectomy with ostomy, who presents for heart failure follow-up.  Past Medical History    Past Medical History:  Diagnosis Date   Ascending Aortic Dilation    a. 09/2021 Echo: Asc Ao 22m.   CAD (coronary artery disease)    a. 07/2021 Cath: LM nl, LAD 688mD2 40, LCX 65p/m, OM1 90, RCA 10074m L-.R collats to RPDA from septal 1/2-->Med rx.   Chronic [place] (heart failure with reduced ejection fraction) [place]  a. 07/2021 Echo: EF 30-35%, glob HK, mod LVH, GrII DD, nl RV fxn, mildly dil LA, mild-mod MR; b. 09/2021 Echo: EF 35-40%, glob HK, mod LVH, GrI DD, nl RV fxn, mildly dil RA, mild MR, Asc Ao 1m75m CKD (chronic kidney disease) stage 2, GFR 60-89 ml/min    Crohn's disease (HCC)[place] Hypertension    Mixed Ischemic & Nonischemic cardiomyopathy    a. 07/2021 Echo: EF 30-35%; b. 07/2021 Cath: Occluded RCA w/ mod LAD/LCX dzs, and severe OM1 dzs-->Med Rx; c. 09/2021 Echo: EF 35-40%.   PAH (pulmonary artery hypertension) (HCC)[place] a. 07/2021 RHC: PA 67/30 (42).   Proteinuria    Past Surgical History:  Procedure Laterality Date   COLECTOMY     RIGHT/LEFT HEART CATH AND CORONARY ANGIOGRAPHY N/A 08/07/2021   Procedure: RIGHT/LEFT HEART CATH AND CORONARY ANGIOGRAPHY;  Surgeon: End,Clarence [name];  [facility];  Service: Cardiovascular;  Laterality: N/A;    Allergies  Allergies  Allergen Reactions   Ibuprofen Hives and Other (See Comments)    Other reaction(s): GI Upset (intolerance) Hives Tolerates Advil  Other reaction(s):  Not available   Spironolactone     Hyperkalemia   Sulfacetamide Other (See Comments)   Neomycin-Bacitracin Zn-Polymyx     Other reaction(s): Other (See Comments)    History of Present Illness    51 y66r old male with the above complex past medical history including CAD, mixed ischemic and nonischemic cardiomyopathy, chronic HFrEF, pulmonary arterial hypertension, stage II chronic kidney disease, hypertension, hyperlipidemia, and Crohn's disease status post colectomy with ostomy.  He was initially admitted in March 2023 with progressive dyspnea over the preceding several months, and was found to have a new cardiomyopathy and atypical pneumonia.  High-sensitivity troponin peaked at 89.  Echo showed an EF of 30 to 35% with global hypokinesis and grade 2 diastolic dysfunction.  He was diuresed and GDMT was initiated.  He subsequently underwent right and left heart cardiac catheterization, which revealed an occluded RCA, severe small OM1 disease, moderate LAD and circumflex disease and left-to-right collaterals to the RPDA.  He had elevated filling pressures with moderate to severe pulmonary hypertension [PA 67/30 (42)] and moderately reduced cardiac output and index.  Clinical picture was consistent with a mixed ischemic and nonischemic cardiomyopathy and he was managed medically.  He was readmitted in May 2023 with chest pain and hypertensive urgency.  Repeat echo showed an EF of 35 to 40%.  Renal artery duplex was concerning for possible right renal artery stenosis.  CT of the abdomen and pelvis showed no acute  abnormality or significant renal artery stenosis.  He was admitted in June 2022 with hypertensive urgency and demand ischemia.  He was medically managed.  [name] was seen on October 3 at which time it was noted that he was taking Entresto 49-51 mg just once a day despite previously being increased to 97-103 mg twice a day.  He had not yet filled that prescription and we agreed that he would  fill that prescription and begin taking.  Cardiac MRI was deferred in the absence of insurance.  He follow-up again on October 25 at which time he had run out of Lasix a week prior and reported positional chest pain, typically occurring when lying on his right side.  Lasix was refilled.  Since his last visit, he has been relatively stable from a heart failure standpoint.  He still is not entirely sure about all of the medications that he is taking but seems pretty sure that he is taking aspirin, atorvastatin, carvedilol, Plavix, Lasix, Vascepa, Imdur, and Entresto.  He is certain that he is not taking [place] secondary to cost.  He is not sure about Zetia.  He notes that he has not been sleeping well.  As of today, he has been awake for about 18 hours straight.  He has found himself staying up most of the night not falling asleep until about 5 AM but was unable to fall asleep this morning.  He continues to experience dyspnea on exertion with things such as washing his car or other things around the house.  He also had mild right sided/right axillary chest discomfort that occurred while washing his car.  This was brief in nature but has recurred intermittently since then.  He denies palpitations, PND, orthopnea, dizziness, syncope, edema, or early satiety.  Home Medications    Current Outpatient Medications  Medication Sig Dispense Refill   acetaminophen (TYLENOL) 325 MG tablet Take 650 mg by mouth at bedtime.     aspirin EC 81 MG tablet Take 81 mg by mouth daily. Swallow whole.     atorvastatin (LIPITOR) 80 MG tablet Take 1 tablet (80 mg total) by mouth every evening. 30 tablet 3   carvedilol (COREG) 12.5 MG tablet Take 12.5 mg by mouth 2 (two) times daily with a meal.     clopidogrel (PLAVIX) 75 MG tablet Take 1 tablet (75 mg total) by mouth daily. 30 tablet 3   dapagliflozin propanediol (FARXIGA) 10 MG TABS tablet Take 10 mg by mouth daily.     ezetimibe (ZETIA) 10 MG tablet Take 1 tablet (10 mg total)  by mouth daily. 90 tablet 3   famotidine (PEPCID) 10 MG tablet Take 10 mg by mouth at bedtime. As needed     furosemide (LASIX) 20 MG tablet Take 1 tablet (20 mg total) by mouth daily. 30 tablet 1   icosapent Ethyl (VASCEPA) 1 g capsule Take 2 capsules (2 g total) by mouth 2 (two) times daily. 180 capsule 3   isosorbide mononitrate (IMDUR) 60 MG 24 hr tablet Take 1 tablet (60 mg total) by mouth daily. 30 tablet 3   sacubitril-valsartan (ENTRESTO) 97-103 MG Take 1 tablet by mouth 2 (two) times daily. 60 tablet 11   No current facility-administered medications for this visit.     Review of Systems    Ongoing dyspnea on exertion with occasional right-sided/right axillary chest discomfort.  He denies palpitations, PND, orthopnea, dizziness, syncope, edema, or early satiety.  All other systems reviewed and are otherwise negative except as  noted above.    Physical Exam    VS:  BP 134/87 (BP Location: Left Arm, Patient Position: Sitting, Cuff Size: Normal)   Pulse 72   Ht 5' 10"  (1.778 m)   Wt 230 lb 8 oz (104.6 kg)   SpO2 98%   BMI 33.07 kg/m  , BMI Body mass index is 33.07 kg/m.    Orthostatic VS for the past 24 hrs:  BP- Lying Pulse- Lying BP- Sitting Pulse- Sitting BP- Standing at 0 minutes Pulse- Standing at 0 minutes  04/01/22 1340 134/87 65 128/85 83 115/77 76     GEN: Well nourished, well developed, in no acute distress. HEENT: normal. Neck: Supple, no JVD, carotid bruits, or masses. Cardiac: RRR, no murmurs, rubs, or gallops. No clubbing, cyanosis, edema.  Radials/PT 2+ and equal bilaterally.  Respiratory:  Respirations regular and unlabored, clear to auscultation bilaterally. GI: Soft, nontender, nondistended, BS + x 4. MS: no deformity or atrophy. Skin: warm and dry, no rash. Neuro:  Strength and sensation are intact. Psych: Normal affect.  Accessory Clinical Findings    ECG personally reviewed by me today -regular sinus rhythm, 72, leftward axis, inferior infarct,  IVCD, lateral T wave inversion- no acute changes.  Lab Results  Component Value Date   WBC 8.3 12/09/2021   HGB 15.5 12/09/2021   HCT 46.2 12/09/2021   MCV 85.4 12/09/2021   PLT 179 12/09/2021   Lab Results  Component Value Date   CREATININE 1.27 (H) 04/01/2022   BUN 18 04/01/2022   NA 142 04/01/2022   K 4.1 04/01/2022   CL 105 04/01/2022   CO2 28 04/01/2022   Lab Results  Component Value Date   ALT 35 01/27/2022   AST 25 01/27/2022   ALKPHOS 87 01/27/2022   BILITOT 1.3 (H) 01/27/2022   Lab Results  Component Value Date   CHOL 148 01/27/2022   HDL 35 (L) 01/27/2022   LDLCALC 70 01/27/2022   LDLDIRECT 69.0 09/09/2021   TRIG 214 (H) 01/27/2022   CHOLHDL 4.2 01/27/2022    Lab Results  Component Value Date   HGBA1C 5.4 08/05/2021    Assessment & Plan    1.  Chronic heart failure with reduced ejection fraction/mixed ischemic and nonischemic cardiomyopathy: EF 30 to 35% with global hypokinesis by echo in March with follow-up echo in May showing an EF of 35 to 40%.  Cath in March showed an occluded RCA with moderate disease involving the LAD and circumflex, as well as more severe disease in the small OM1.  He has left-to-right collaterals and has been managed medically.  Though his weight has been going up in the setting of inactivity and what he describes as eating too much, his volume status has been stable, as has his symptoms.  He does continue to experience dyspnea on exertion with moderate Lee vigorous activities such as washing a car.  He admits to frequently missing his medications, which she attributes to frustration with having to take any medicines.  He was not exactly sure which medicines he was taking and was going to get in touch with [place] following today's visit to let us know what he has actually been taking.  Plan to continue current regimen, presuming that he is taking.  He was not able to afford [place] previously and though we filled out assistance paperwork, he  apparently was not able to obtain.  He was previously on spironolactone but this was discontinued secondary to hyperkalemia.  2.  Coronary artery  disease: As above, diagnostic catheterization in March 2023 showed an occluded RCA with moderate disease in the LAD and circumflex, as well as more severe disease in the small OM1.  He has left-to-right collaterals and has been medically managed.  He has dyspnea on exertion with activity.  He has had some right axillary pain which seems to be intermittent and comes and goes with rest or with activity and is not likely angina.  He says that he has submitted paperwork for financial assistance for cMRI (? Viability of inferior wall) and is awaiting a response.  He remains on aspirin, statin, beta-blocker, Plavix, Zetia, Vascepa, and nitrate therapy.  3.  Stage III chronic kidney disease with history of hyperkalemia: Follow-up labs today with normal potassium and stable creatinine of 1.27.  4.  Sleep disordered breathing: Recently underwent sleep study.  He says he has not heard from pulmonology yet.  5.  Ectatic ascending thoracic aorta: We will need follow-up CT of the chest and aorta in June 2024.  6.  Essential hypertension: Stable.  7.  Pulmonary nodules: Stable on CT earlier this year.  8.  Abnormal thyroid function: TSH of 0.258 in March.  He plans to follow-up with primary care.  9.  Hyperlipidemia: LDL of 70 in October.  Continue statin, Zetia, and Vascepa.  10.  Disposition: Patient has follow-up with heart failure clinic in December.   Murray Hodgkins, NP 04/01/2022, 5:51 PM

## 2022-04-13 ENCOUNTER — Telehealth: Payer: Self-pay | Admitting: Pulmonary Disease

## 2022-04-13 NOTE — Telephone Encounter (Signed)
Call patient  Sleep study result  Date of study: 03/22/2022  Impression: Negative study for significant sleep disordered breathing Insomnia Nonrestrictive sleep  Recommendation:  .  Optimize sleep hygiene  .  Aggressive weight loss measures  .  Further evaluation and management of insomnia  .  Clinical monitoring of symptoms and further evaluation as needed  .  Follow-up as previously scheduled

## 2022-04-13 NOTE — Telephone Encounter (Signed)
I called and spoke with the pt and notified of response per Dr Ander Slade. He verbalized understanding. Nothing further needed.

## 2022-04-13 NOTE — Procedures (Signed)
POLYSOMNOGRAPHY  Last, First: Clarence [name], Clarence [name] MRN: [id number] Gender: Male Age (years): 59 Weight (lbs): 220 DOB: Sep 17, 1970 BMI: 32 Primary Care: No PCP Epworth Score: 14 Referring: Laurin Coder MD Technician: Gwenyth Allegra Interpreting: Laurin Coder MD Study Type: NPSG Ordered Study Type: Split Night CPAP Study date: 03/22/2022 Location: [place] CLINICAL INFORMATION Clarence [name] is a 51 year old Male and was referred to the sleep center for evaluation of G47.80 Other Sleep Disorders. Indications include Congestive Heart Failure, Hypertension.  MEDICATIONS Patient self administered medications include: N/A. Medications administered during study include No sleep medicine administered.  SLEEP STUDY TECHNIQUE A multi-channel overnight Polysomnography study was performed. The channels recorded and monitored were central and occipital EEG, electrooculogram (EOG), submentalis EMG (chin), nasal and oral airflow, thoracic and abdominal wall motion, anterior tibialis EMG, snore microphone, electrocardiogram, and a pulse oximetry. TECHNICIAN COMMENTS Comments added by Technician: Patient was restless all through the night. Comments added by Scorer: N/A SLEEP ARCHITECTURE The study was initiated at 10:42:15 PM and terminated at 4:47:23 AM. The total recorded time was 365.1 minutes. EEG confirmed total sleep time was 142 minutes yielding a sleep efficiency of 38.9%%. Sleep onset after lights out was 49.4 minutes with a REM latency of 113.0 minutes. The patient spent 24.6%% of the night in stage N1 sleep, 70.8%% in stage N2 sleep, 0.0%% in stage N3 and 4.6% in REM. Wake after sleep onset (WASO) was 173.7 minutes. The Arousal Index was 35.1/hour. RESPIRATORY PARAMETERS There were a total of 9 respiratory disturbances out of which 1 were apneas ( 0 obstructive, 0 mixed, 1 central) and 8 hypopneas. The apnea/hypopnea index (AHI) was 3.8 events/hour. The central sleep apnea index was  0.4 events/hour. The REM AHI was 18.5 events/hour and NREM AHI was 3.1 events/hour. The supine AHI was 4.6 events/hour and the non supine AHI was 1.6 supine during 73.07% of sleep. Respiratory disturbances were associated with oxygen desaturation down to a nadir of 91.0% during sleep. The mean oxygen saturation during the study was 96.4%.   LEG MOVEMENT DATA The total leg movements were 0 with a resulting leg movement index of 0.0/hr .Associated arousal with leg movement index was 0.0/hr.  CARDIAC DATA The underlying cardiac rhythm was most consistent with sinus rhythm. Mean heart rate during sleep was 61.6 bpm. Additional rhythm abnormalities include None.  IMPRESSIONS - No Significant Obstructive Sleep apnea(OSA) - EKG showed no cardiac abnormalities. - No significant Oxygen Desaturation - The patient snored with loud snoring volume. - No significant periodic leg movements(PLMs) during sleep. However, no significant associated arousals.  DIAGNOSIS - Insomia - Non restorative sleep - Upper Airway Resistance Syndrome (G47.8) - RECOMMENDATIONS - Further evaluation and treatment for insomnia - Avoid alcohol, sedatives and other CNS depressants that may worsen sleep apnea and disrupt normal sleep architecture. - Sleep hygiene should be reviewed to assess factors that may improve sleep quality. - Weight management and regular exercise should be initiated or continued. - Clinical monitoring of symptoms and further evaluation as needed.  [Electronically signed] 04/13/2022 06:40 AM  Sherrilyn Rist MD NPI: [id number]

## 2022-04-13 NOTE — Telephone Encounter (Signed)
Called the patient to discuss his MyChart message. He stated that this morning his blood pressure was increased to 170/100's. This was after taking his medications. He stated that he has been continuously checking it today and it has gradually gone down some. He stated that it is normal for his blood pressure to run in the 130/100's. At one point, he stated that his blood pressure was 120/100 and while on the phone it was 157/100. He also wonders about the validity of the blood pressure machine.  He does watch his sodium intake. He currently is asymptomatic. He has been advised that the more he takes his blood pressure, the higher it may go. He stated that doesn't sleep well and sometimes stays up 15 hours a day.

## 2022-04-15 ENCOUNTER — Other Ambulatory Visit (HOSPITAL_COMMUNITY): Payer: Self-pay

## 2022-04-17 ENCOUNTER — Other Ambulatory Visit (HOSPITAL_COMMUNITY): Payer: Self-pay

## 2022-04-27 NOTE — Telephone Encounter (Signed)
Advanced Heart Failure Patient Advocate Encounter   Patient was approved to receive Entresto from [employer]  Effective dates: 04/24/22 through 04/25/23  Document scanned to chart.   Charlann Boxer, CPhT

## 2022-04-29 ENCOUNTER — Ambulatory Visit (HOSPITAL_COMMUNITY)
Admission: RE | Admit: 2022-04-29 | Discharge: 2022-04-29 | Disposition: A | Discharge status: Home / Self Care | Payer: Medicaid Other | Source: Ambulatory Visit | Attending: Cardiology | Admitting: Cardiology

## 2022-04-29 ENCOUNTER — Other Ambulatory Visit (HOSPITAL_COMMUNITY): Payer: Self-pay

## 2022-04-29 ENCOUNTER — Encounter (HOSPITAL_COMMUNITY): Payer: Self-pay | Admitting: Cardiology

## 2022-04-29 VITALS — BP 120/78 | HR 72 | Wt 234.4 lb

## 2022-04-29 DIAGNOSIS — I5042 Chronic combined systolic (congestive) and diastolic (congestive) heart failure: Secondary | ICD-10-CM | POA: Insufficient documentation

## 2022-04-29 DIAGNOSIS — I5022 Chronic systolic (congestive) heart failure: Secondary | ICD-10-CM

## 2022-04-29 LAB — BASIC METABOLIC PANEL
Anion gap: 7 (ref 5–15)
BUN: 12 mg/dL (ref 6–20)
CO2: 26 mmol/L (ref 22–32)
Calcium: 9.4 mg/dL (ref 8.9–10.3)
Chloride: 109 mmol/L (ref 98–111)
Creatinine, Ser: 1.03 mg/dL (ref 0.61–1.24)
GFR, Estimated: >60 mL/min (ref >60)
Glucose, Bld: 113 mg/dL — ABNORMAL HIGH (ref 70–99)
Potassium: 3.7 mmol/L (ref 3.5–5.1)
Sodium: 142 mmol/L (ref 135–145)

## 2022-04-29 LAB — BRAIN NATRIURETIC PEPTIDE: B Natriuretic Peptide: 171.4 pg/mL — ABNORMAL HIGH (ref 0.0–100.0)

## 2022-04-29 MED ORDER — ENTRESTO 97-103 MG PO TABS: 1.0000 | ORAL_TABLET | Freq: Two times a day (BID) | ORAL | 3 refills | Status: DC

## 2022-04-29 MED ORDER — FENOFIBRATE 48 MG PO TABS
48.0000 mg | ORAL_TABLET | Freq: Every day | ORAL | 3 refills | Status: DC
  Filled 2022-04-29: qty 30, 30d supply, fill #0

## 2022-04-29 NOTE — Patient Instructions (Addendum)
STOP isosorbide Mononitrate  STOP Vascepa  STOP Farxiga  START Jardinace 75m ( 1 Tablet) daily.  START Fenofibrate 430m( 1 Tablet) daily.  ONCE you get your EnDelene Lollrom the company take 1 tablet twice a day.  Labs done today, your results will be available in MyChart, we will contact you for abnormal readings.  Repeat blood work in 10 days at [facility] Your physician recommends that you schedule a follow-up appointment in: 3 weeks  If you have any questions or concerns before your next appointment please send usK[place] message through myPadenr call our office at [phone]   TO LEAVE A MESSAGE FOR THE NURSE SELECT OPTION 2, PLEASE LEAVE A MESSAGE INCLUDING: YOUR NAME DATE OF BIRTH CALL BACK NUMBER REASON FOR CALL**this is important as we prioritize the call backs  YOU WILL RECEIVE A CALL BACK THE SAME DAY AS LONG AS YOU CALL BEFORE 4:00 PM At the [facility] and your health needs are our priority. As part of our continuing mission to provide you with exceptional heart care, we have created designated Provider Care Teams. These Care Teams include your primary Cardiologist (physician) and Advanced Practice Providers (APPs- Physician Assistants and Nurse Practitioners) who all work together to provide you with the care you need, when you need it.   You may see any of the following providers on your designated Care Team at your next follow up: Dr DaGlori Bickersr DaLoralie Champagner. AdRoxana HiresNP BrLyda JesterPAUtaheSurgery Center Of Aventura LtdiRenningersPAUtahlForestine NaNP LaAudry RilesPharmD   Please be sure to bring in all your medications bottles to every appointment.

## 2022-04-29 NOTE — Progress Notes (Signed)
H&V Care Navigation CSW Progress Note  Clinical Social Worker consulted to help with medication concerns and lack of insurance.  CSW reviewed chart and saw already has [employer] for [employer] assistance.  Helped to call [employer] and confirmed they have 49-13m and pt is now on 97-103.  CSW assisted in calling in verbal order for new script- they report we should call back in 48 hours to place order for shipment.  Per previous conversation with pt he was working with advocate for Medicaid once his reserve was below the limit- reports it is now under the limit- confirms he still has her number and will call her asap.   SDOH Screenings   Food Insecurity: No Food Insecurity (01/27/2022)  Housing: Low Risk  (01/27/2022)  Transportation Needs: No Transportation Needs (01/27/2022)  Depression (PHQ2-9): Low Risk  (10/29/2021)  Financial Resource Strain: High Risk (01/29/2022)  Tobacco Use: Medium Risk (04/29/2022)    JJorge Ny LNaschitti ClinicDesk#: [phone]#: [phone]

## 2022-04-29 NOTE — Progress Notes (Signed)
Medication Samples have been provided to the patient.  Drug name: Jardiance       Strength: 10 mg        Qty: 4  LOT: [id number]  Exp.Date: 03/2024  Dosing instructions: Take 1 tablet daily  The patient has been instructed regarding the correct time, dose, and frequency of taking this medication, including desired effects and most common side effects.   Juanita Laster Eliyah Mcshea 3:53 PM 04/29/2022

## 2022-04-30 ENCOUNTER — Telehealth (HOSPITAL_COMMUNITY): Payer: Self-pay

## 2022-04-30 NOTE — Progress Notes (Signed)
PCP: Jon Billings, NP Cardiology: Dr. Saunders Revel HF Cardiology: Dr Aundra Dubin  51 y.o. with history of CAD, chronic systolic CHF, Crohns disease, CKD stage 3, and HTN was referred by Dr. Saunders Revel to HF clinic for evaluation of CHF.  Patient was admitted in 3/23 with newly found cardiomyopathy, CHF and atypical PNA.  Echo in 3/23 showed EF 30-35%, moderate LVH, normal RV. LHC showed occluded RCA with collaterals, 60% mLAD, 65% mLCx, 90% mid OM1.  RHC showed elevated filling pressures and low CI (2).  Patient was admitted again in 4/23 with CHF. He was admitted in 5/23 with hypertensive urgency and chest pain, CTA abdomen/pelvis did not show renal artery stenosis. He was admitted again in 6/23 with hypertensive urgency.  He quit smoking after 3/23 admission.  Rarely drinks ETOH and no drugs.  No strong family history of CAD or cardiomyopathy.    Sleep study was done, not suggestive of OSA.   Today he returns for HF follow up. Still short of breath carrying groceries and after walking about 50 yards.  He is short of breath with stairs.  Occasional atypical chest pain, never exertional.  He occasionally gets lightheaded if he bends over and stands up fast.  No orthopnea/PND.  Weight up 10 lbs. He remains confused about his medications. Not taking Wilder Glade as he thought he was supposed to stop this when he started [place].  Taking old Entresto 24/26 bid as he says the [facility] 97/103 never came to him.   Labs (5/23): LDL 43, TGs 382 Labs (6/23): K 4.2, creatinine 1.16 Labs (7/23): K 4.2, creatinine 1.42, LDL 70, TGs 214 Labs (11/23): K 4.1, creatinine 1.27  PMH: 1. Crohns disease: s/p colectomy with ostomy.  2. CKD stage 3: History of hyperkalemia.  3. HTN: CT abdomen/pelvis with no evidence for renal artery stenosis.  4. Pulmonary nodules: Stable on last CT.  5. CAD: LHC 3/23 with CTO mid RCA with collaterals, 60% mLAD, 65% mLCx, 90% mid OM1.  6. Chronic systolic CHF: Ischemic cardiomyopathy (cannot fully rule  out nonischemic component).  - Echo (3/23): EF 30-35%, moderate LVH, normal RV. - RHC (3/23): mean RA 16, PA 67/30 mean 42, mean PCWP 25, CI 2, PVR 4 - Echo (5/23): EF 35-40%, global HK, moderate LVH, RV normal, mild MR 7. Sleep study negative for OSA.   SH: Single, quit smoking in 3/23, rare ETOH/no drugs, lives in [place] and works for a [employer].   FH: Uncles with "heart trouble"  ROS: All systems reviewed and negative except as per HPI.   Current Outpatient Medications  Medication Sig Dispense Refill   acetaminophen (TYLENOL) 325 MG tablet Take 650 mg by mouth at bedtime.     aspirin EC 81 MG tablet Take 81 mg by mouth daily. Swallow whole.     atorvastatin (LIPITOR) 80 MG tablet Take 1 tablet (80 mg total) by mouth every evening. 30 tablet 3   carvedilol (COREG) 12.5 MG tablet Take 12.5 mg by mouth 2 (two) times daily with a meal.     clopidogrel (PLAVIX) 75 MG tablet Take 1 tablet (75 mg total) by mouth daily. 30 tablet 3   ezetimibe (ZETIA) 10 MG tablet Take 1 tablet (10 mg total) by mouth daily. 90 tablet 3   famotidine (PEPCID) 10 MG tablet Take 10 mg by mouth at bedtime. As needed     fenofibrate (TRICOR) 48 MG tablet Take 1 tablet (48 mg total) by mouth daily. 90 tablet 3   furosemide (LASIX)  20 MG tablet Take 1 tablet (20 mg total) by mouth daily. 30 tablet 1   sacubitril-valsartan (ENTRESTO) 97-103 MG Take 1 tablet by mouth 2 (two) times daily. 180 tablet 3   No current facility-administered medications for this encounter.   Wt Readings from Last 3 Encounters:  04/29/22 106.3 kg (234 lb 6.4 oz)  04/01/22 104.6 kg (230 lb 8 oz)  03/22/22 99.8 kg (220 lb)   BP 120/78   Pulse 72   Wt 106.3 kg (234 lb 6.4 oz)   SpO2 96%   BMI 33.63 kg/m  General: NAD Neck: No JVD, no thyromegaly or thyroid nodule.  Lungs: Clear to auscultation bilaterally with normal respiratory effort. CV: Nondisplaced PMI.  Heart regular S1/S2, no S3/S4, no murmur.  No peripheral edema.   No carotid bruit.  Normal pedal pulses.  Abdomen: Soft, nontender, no hepatosplenomegaly, no distention.  Skin: Intact without lesions or rashes.  Neurologic: Alert and oriented x 3.  Psych: Normal affect. Extremities: No clubbing or cyanosis.  HEENT: Normal.   Assessment/Plan: 1. Chronic systolic CHF: Suspect primarily ischemic cardiomyopathy but cannot rule out component of nonischemic cardiomyopathy. RHC in 3/23 with elevated filling pressures and low CI, 2. Echo in 5/23 with EF 35-40%, normal RV.  Weight up and NYHA class 3 symptoms, but he does not look significantly volume overloaded. - Will transition back to Entresto 97/103 bid, he should get patient assistance.  - Continue Lasix 20 mg daily.  - Continue Coreg 12.5 mg bid.  - Start Farxiga 10 mg daily, BMET/BNP today and BMET in 10 days.  - Off spironolactone with hyperkalemia, would like to restart in the future. Use of SGLT2 inhibitor may allow spironolactone use.  - EF appears to be just out of ICD range.  Would not be able to get ICD regardless until he gets insurance.    2. CAD: Cath in 3/23 with CTO mid RCA with collaterals, 60% mLAD, 65% mLCx, 90% mid OM1.  Managed medically.  No recent angina.  - Stop Imdur to simplify regimen.  - Continue ASA 81 and Plavix 75 daily.  - Continue atorvastatin and Zetia, good LDL in 7/23.  - Continue Vascepa 2 g bid with elevated triglycerides.  3. HTN: BP controlled.   He has significant difficulty managing his medications.  Idea for paramedicine but unfortunately not a [facility] patient so unable to be part of this program.  Followup APP in 3 wks.   Loralie Champagne 04/30/2022

## 2022-04-30 NOTE — Telephone Encounter (Signed)
Advanced Heart Failure Patient Advocate Encounter  Application for Jardiance faxed to [facility] on 04/29/22. Application form attached to patient chart.  Clista Bernhardt, CPhT Rx Patient Advocate Phone: [phone]

## 2022-05-01 ENCOUNTER — Telehealth (HOSPITAL_COMMUNITY): Payer: Self-pay | Admitting: Licensed Clinical Social Worker

## 2022-05-01 NOTE — Telephone Encounter (Signed)
Advanced Heart Failure Patient Advocate Encounter  Patient was approved to receive Jardiance from [place] Effective dates: 05/01/22 to 05/01/23  Informed patient by phone.

## 2022-05-01 NOTE — Telephone Encounter (Signed)
H&V Care Navigation CSW Progress Note  Clinical Social Worker called to follow up regarding [employer] and shipment.  Spoke to rep who confirmed new 97-130m script is in but that they already sent out shipment of 49-574mon 12/11(despite pt having no recollection of this) so they tried to send CSW to pharmacy team to see if they could send out correct script.  Was on hold for 1 hour to speak to pharmacy then had to stop call- provided numbers to pt to follow up.   SDOH Screenings   Food Insecurity: No Food Insecurity (01/27/2022)  Housing: Low Risk  (01/27/2022)  Transportation Needs: No Transportation Needs (01/27/2022)  Depression (PHQ2-9): Low Risk  (10/29/2021)  Financial Resource Strain: High Risk (01/29/2022)  Tobacco Use: Medium Risk (04/29/2022)    JeJorge NyLCMedina Clinicesk#: [phone]#: [phone]

## 2022-05-06 ENCOUNTER — Other Ambulatory Visit (HOSPITAL_COMMUNITY): Payer: Self-pay | Admitting: Cardiology

## 2022-05-06 ENCOUNTER — Other Ambulatory Visit: Payer: Self-pay | Admitting: Internal Medicine

## 2022-05-06 ENCOUNTER — Other Ambulatory Visit (HOSPITAL_COMMUNITY): Payer: Self-pay

## 2022-05-06 MED ORDER — FUROSEMIDE 20 MG PO TABS
20.0000 mg | ORAL_TABLET | Freq: Every day | ORAL | 1 refills | Status: DC
  Filled 2022-05-06: qty 30, 30d supply, fill #0

## 2022-05-12 ENCOUNTER — Other Ambulatory Visit (HOSPITAL_COMMUNITY): Payer: Self-pay

## 2022-05-19 NOTE — Progress Notes (Signed)
PCP: Jon Billings, NP Cardiology: Dr. Saunders Revel HF Cardiology: Dr Aundra Dubin  52 y.o. with history of CAD, chronic systolic CHF, Crohns disease, CKD stage 3, and HTN was referred by Dr. Saunders Revel to HF clinic for evaluation of CHF.  Patient was admitted in 3/23 with newly found cardiomyopathy, CHF and atypical PNA.  Echo in 3/23 showed EF 30-35%, moderate LVH, normal RV. LHC showed occluded RCA with collaterals, 60% mLAD, 65% mLCx, 90% mid OM1.  RHC showed elevated filling pressures and low CI (2).  Patient was admitted again in 4/23 with CHF. He was admitted in 5/23 with hypertensive urgency and chest pain, CTA abdomen/pelvis did not show renal artery stenosis. He was admitted again in 6/23 with hypertensive urgency.  He quit smoking after 3/23 admission.  Rarely drinks ETOH and no drugs.  No strong family history of CAD or cardiomyopathy.    Sleep study was done, not suggestive of OSA.   Today he returns for HF follow up. Still short of breath carrying groceries and after walking about 50 yards.  He is short of breath with stairs.  Occasional atypical chest pain, never exertional.  He occasionally gets lightheaded if he bends over and stands up fast.  No orthopnea/PND.  Weight up 10 lbs. He remains confused about his medications. Not taking Wilder Glade as he thought he was supposed to stop this when he started Norton.  Taking old Entresto 24/26 bid as he says the [facility] 97/103 never came to him.   Labs (5/23): LDL 43, TGs 382 Labs (6/23): K 4.2, creatinine 1.16 Labs (7/23): K 4.2, creatinine 1.42, LDL 70, TGs 214 Labs (11/23): K 4.1, creatinine 1.27  PMH: 1. Crohns disease: s/p colectomy with ostomy.  2. CKD stage 3: History of hyperkalemia.  3. HTN: CT abdomen/pelvis with no evidence for renal artery stenosis.  4. Pulmonary nodules: Stable on last CT.  5. CAD: LHC 3/23 with CTO mid RCA with collaterals, 60% mLAD, 65% mLCx, 90% mid OM1.  6. Chronic systolic CHF: Ischemic cardiomyopathy (cannot fully rule  out nonischemic component).  - Echo (3/23): EF 30-35%, moderate LVH, normal RV. - RHC (3/23): mean RA 16, PA 67/30 mean 42, mean PCWP 25, CI 2, PVR 4 - Echo (5/23): EF 35-40%, global HK, moderate LVH, RV normal, mild MR 7. Sleep study negative for OSA.   SH: Single, quit smoking in 3/23, rare ETOH/no drugs, lives in [place] and works for a [employer] worker.   FH: Uncles with "heart trouble"  ROS: All systems reviewed and negative except as per HPI.   Current Outpatient Medications  Medication Sig Dispense Refill   acetaminophen (TYLENOL) 325 MG tablet Take 650 mg by mouth at bedtime.     aspirin EC 81 MG tablet Take 81 mg by mouth daily. Swallow whole.     atorvastatin (LIPITOR) 80 MG tablet Take 1 tablet (80 mg total) by mouth every evening. 30 tablet 3   carvedilol (COREG) 12.5 MG tablet Take 12.5 mg by mouth 2 (two) times daily with a meal.     clopidogrel (PLAVIX) 75 MG tablet Take 1 tablet (75 mg total) by mouth daily. 30 tablet 3   ezetimibe (ZETIA) 10 MG tablet Take 1 tablet (10 mg total) by mouth daily. 90 tablet 3   famotidine (PEPCID) 10 MG tablet Take 10 mg by mouth at bedtime. As needed     fenofibrate (TRICOR) 48 MG tablet Take 1 tablet (48 mg total) by mouth daily. 90 tablet 3   furosemide (LASIX)  20 MG tablet Take 1 tablet (20 mg total) by mouth daily. 30 tablet 1   sacubitril-valsartan (ENTRESTO) 97-103 MG Take 1 tablet by mouth 2 (two) times daily. 180 tablet 3   No current facility-administered medications for this visit.   Wt Readings from Last 3 Encounters:  04/29/22 106.3 kg (234 lb 6.4 oz)  04/01/22 104.6 kg (230 lb 8 oz)  03/22/22 99.8 kg (220 lb)   There were no vitals taken for this visit. General: NAD Neck: No JVD, no thyromegaly or thyroid nodule.  Lungs: Clear to auscultation bilaterally with normal respiratory effort. CV: Nondisplaced PMI.  Heart regular S1/S2, no S3/S4, no murmur.  No peripheral edema.  No carotid bruit.  Normal pedal pulses.   Abdomen: Soft, nontender, no hepatosplenomegaly, no distention.  Skin: Intact without lesions or rashes.  Neurologic: Alert and oriented x 3.  Psych: Normal affect. Extremities: No clubbing or cyanosis.  HEENT: Normal.   Assessment/Plan: 1. Chronic systolic CHF: Suspect primarily ischemic cardiomyopathy but cannot rule out component of nonischemic cardiomyopathy. RHC in 3/23 with elevated filling pressures and low CI, 2. Echo in 5/23 with EF 35-40%, normal RV.  Weight up and NYHA class 3 symptoms, but he does not look significantly volume overloaded. - Will transition back to Entresto 97/103 bid, he should get patient assistance.  - Continue Lasix 20 mg daily.  - Continue Coreg 12.5 mg bid.  - Start Farxiga 10 mg daily, BMET/BNP today and BMET in 10 days.  - Off spironolactone with hyperkalemia, would like to restart in the future. Use of SGLT2 inhibitor may allow spironolactone use.  - EF appears to be just out of ICD range.  Would not be able to get ICD regardless until he gets insurance.    2. CAD: Cath in 3/23 with CTO mid RCA with collaterals, 60% mLAD, 65% mLCx, 90% mid OM1.  Managed medically.  No recent angina.  - Stop Imdur to simplify regimen.  - Continue ASA 81 and Plavix 75 daily.  - Continue atorvastatin and Zetia, good LDL in 7/23.  - Continue Vascepa 2 g bid with elevated triglycerides.  3. HTN: BP controlled.   He has significant difficulty managing his medications.  Idea for paramedicine but unfortunately not a [facility] patient so unable to be part of this program.  Followup APP in 3 wks.   [place] 05/19/2022

## 2022-05-20 ENCOUNTER — Other Ambulatory Visit (HOSPITAL_COMMUNITY): Payer: Self-pay

## 2022-05-20 ENCOUNTER — Ambulatory Visit (HOSPITAL_COMMUNITY)
Admission: RE | Admit: 2022-05-20 | Discharge: 2022-05-20 | Disposition: A | Discharge status: Home / Self Care | Payer: Medicaid Other | Source: Ambulatory Visit | Attending: Family Medicine | Admitting: Family Medicine

## 2022-05-20 ENCOUNTER — Encounter (HOSPITAL_COMMUNITY): Payer: Self-pay

## 2022-05-20 VITALS — BP 152/100 | HR 78 | Ht 70.0 in | Wt 228.0 lb

## 2022-05-20 DIAGNOSIS — I2510 Atherosclerotic heart disease of native coronary artery without angina pectoris: Secondary | ICD-10-CM | POA: Diagnosis not present

## 2022-05-20 DIAGNOSIS — Z7902 Long term (current) use of antithrombotics/antiplatelets: Secondary | ICD-10-CM | POA: Diagnosis not present

## 2022-05-20 DIAGNOSIS — Z7982 Long term (current) use of aspirin: Secondary | ICD-10-CM | POA: Diagnosis not present

## 2022-05-20 DIAGNOSIS — I255 Ischemic cardiomyopathy: Secondary | ICD-10-CM | POA: Diagnosis not present

## 2022-05-20 DIAGNOSIS — Z87891 Personal history of nicotine dependence: Secondary | ICD-10-CM | POA: Insufficient documentation

## 2022-05-20 DIAGNOSIS — I25118 Atherosclerotic heart disease of native coronary artery with other forms of angina pectoris: Secondary | ICD-10-CM | POA: Diagnosis not present

## 2022-05-20 DIAGNOSIS — E781 Pure hyperglyceridemia: Secondary | ICD-10-CM | POA: Diagnosis not present

## 2022-05-20 DIAGNOSIS — K5090 Crohn's disease, unspecified, without complications: Secondary | ICD-10-CM | POA: Insufficient documentation

## 2022-05-20 DIAGNOSIS — Z79899 Other long term (current) drug therapy: Secondary | ICD-10-CM | POA: Insufficient documentation

## 2022-05-20 DIAGNOSIS — I5022 Chronic systolic (congestive) heart failure: Secondary | ICD-10-CM | POA: Diagnosis not present

## 2022-05-20 DIAGNOSIS — I10 Essential (primary) hypertension: Secondary | ICD-10-CM

## 2022-05-20 DIAGNOSIS — I130 Hypertensive heart and chronic kidney disease with heart failure and stage 1 through stage 4 chronic kidney disease, or unspecified chronic kidney disease: Secondary | ICD-10-CM | POA: Insufficient documentation

## 2022-05-20 DIAGNOSIS — N183 Chronic kidney disease, stage 3 unspecified: Secondary | ICD-10-CM | POA: Diagnosis not present

## 2022-05-20 LAB — BASIC METABOLIC PANEL
Anion gap: 9 (ref 5–15)
BUN: 15 mg/dL (ref 6–20)
CO2: 27 mmol/L (ref 22–32)
Calcium: 9.5 mg/dL (ref 8.9–10.3)
Chloride: 103 mmol/L (ref 98–111)
Creatinine, Ser: 1.3 mg/dL — ABNORMAL HIGH (ref 0.61–1.24)
GFR, Estimated: >60 mL/min (ref >60)
Glucose, Bld: 103 mg/dL — ABNORMAL HIGH (ref 70–99)
Potassium: 3.6 mmol/L (ref 3.5–5.1)
Sodium: 139 mmol/L (ref 135–145)

## 2022-05-20 MED ORDER — FUROSEMIDE 20 MG PO TABS
20.0000 mg | ORAL_TABLET | Freq: Every day | ORAL | 4 refills | Status: DC
  Filled 2022-05-20: qty 30, 30d supply, fill #0

## 2022-05-20 MED ORDER — FENOFIBRATE 48 MG PO TABS
48.0000 mg | ORAL_TABLET | Freq: Every day | ORAL | 8 refills | Status: DC
  Filled 2022-05-20: qty 30, 30d supply, fill #0
  Filled 2022-06-17: qty 30, 30d supply, fill #1
  Filled 2022-07-20: qty 30, 30d supply, fill #2
  Filled 2022-08-22: qty 30, 30d supply, fill #3
  Filled 2022-09-18: qty 30, 30d supply, fill #4
  Filled 2022-10-28: qty 30, 30d supply, fill #5
  Filled 2022-11-27: qty 30, 30d supply, fill #6
  Filled 2022-12-30 – 2022-12-31 (×2): qty 30, 30d supply, fill #7
  Filled 2023-01-28: qty 30, 30d supply, fill #8

## 2022-05-20 MED ORDER — ASPIRIN 81 MG PO TBEC
81.0000 mg | DELAYED_RELEASE_TABLET | Freq: Every day | ORAL | 4 refills | Status: DC
  Filled 2022-05-20: qty 30, 30d supply, fill #0
  Filled 2022-07-07: qty 30, 30d supply, fill #1
  Filled 2022-08-01 – 2022-11-09 (×3): qty 30, 30d supply, fill #2
  Filled 2023-01-28: qty 30, 30d supply, fill #3
  Filled 2023-03-16: qty 30, 30d supply, fill #4

## 2022-05-20 MED ORDER — FUROSEMIDE 20 MG PO TABS
20.0000 mg | ORAL_TABLET | ORAL | 4 refills | Status: DC | PRN
  Filled 2022-05-20: qty 30, 30d supply, fill #0

## 2022-05-20 MED ORDER — CARVEDILOL 12.5 MG PO TABS
12.5000 mg | ORAL_TABLET | Freq: Two times a day (BID) | ORAL | 8 refills | Status: DC
  Filled 2022-05-20: qty 60, 30d supply, fill #0
  Filled 2022-06-05 (×2): qty 60, 30d supply, fill #1

## 2022-05-20 MED ORDER — ATORVASTATIN CALCIUM 80 MG PO TABS
80.0000 mg | ORAL_TABLET | Freq: Every evening | ORAL | 4 refills | Status: DC
  Filled 2022-05-20: qty 30, 30d supply, fill #0
  Filled 2022-06-17: qty 30, 30d supply, fill #1
  Filled 2022-07-20: qty 30, 30d supply, fill #2
  Filled 2022-08-21: qty 30, 30d supply, fill #3
  Filled 2022-09-14 (×2): qty 30, 30d supply, fill #4

## 2022-05-20 MED ORDER — CLOPIDOGREL BISULFATE 75 MG PO TABS
75.0000 mg | ORAL_TABLET | Freq: Every day | ORAL | 6 refills | Status: DC
  Filled 2022-05-20: qty 30, 30d supply, fill #0
  Filled 2022-06-17: qty 30, 30d supply, fill #1
  Filled 2022-07-20: qty 30, 30d supply, fill #2
  Filled 2022-08-21: qty 30, 30d supply, fill #3
  Filled 2022-09-14 (×2): qty 30, 30d supply, fill #4
  Filled 2022-10-28: qty 30, 30d supply, fill #5

## 2022-05-20 NOTE — Patient Instructions (Addendum)
Thank you for coming in today  Labs were done today, if any labs are abnormal the clinic will call you No news is good news  RESTART Coreg 12.5 mg 1 tablet twice daily  CHANGE Lasix to 20 mg 2 tablet as needed daily For weight gain of 3 lbs in 24 hours or 5 lbs in a week  Your physician recommends that you schedule a follow-up appointment in:  4 weeks in clinic     Do the following things EVERYDAY: Weigh yourself in the morning before breakfast. Write it down and keep it in a log. Take your medicines as prescribed Eat low salt foods--Limit salt (sodium) to 2000 mg per day.  Stay as active as you can everyday Limit all fluids for the day to less than 2 liters  At the [facility], you and your health needs are our priority. As part of our continuing mission to provide you with exceptional heart care, we have created designated Provider Care Teams. These Care Teams include your primary Cardiologist (physician) and Advanced Practice Providers (APPs- Physician Assistants and Nurse Practitioners) who all work together to provide you with the care you need, when you need it.   You may see any of the following providers on your designated Care Team at your next follow up: Dr Glori Bickers Dr Loralie Champagne Dr. Roxana Hires, NP Lyda Jester, [place] [facility] [place], [place] Forestine Na, NP Audry Riles, PharmD   Please be sure to bring in all your medications bottles to every appointment.   If you have any questions or concerns before your next appointment please send [place] a message through [place] or call our office at [phone].    TO LEAVE A MESSAGE FOR THE NURSE SELECT OPTION 2, PLEASE LEAVE A MESSAGE INCLUDING: YOUR NAME DATE OF BIRTH CALL BACK NUMBER REASON FOR CALL**this is important as we prioritize the call backs  YOU WILL RECEIVE A CALL BACK THE SAME DAY AS LONG AS YOU CALL BEFORE 4:00 PM

## 2022-05-20 NOTE — Progress Notes (Signed)
ReDS Vest / Clip - 05/20/22 1509       ReDS Vest / Clip   Station Marker D    Ruler Value 33    ReDS Value Range Low volume    ReDS Actual Value 28    Anatomical Comments sitting

## 2022-06-04 ENCOUNTER — Ambulatory Visit: Payer: Self-pay | Admitting: Nurse Practitioner

## 2022-06-04 NOTE — Progress Notes (Signed)
Cardiology Clinic Note   Patient Name: Clarence [name] Date of Encounter: 06/05/2022  Primary Care Provider:  Jon Billings, NP Primary Cardiologist:  Nelva Bush, MD  Patient Profile    Clarence [name] is a 52 y.o. male with a past medical history of CAD per angiography, chronic systolic heart failure/ischemic cardiomyopathy, ascending aortic dilatation, pulmonary hypertension, hypertension, hyperlipidemia, Crohn's disease s/p colectomy with ostomy  who presents to the clinic today for follow-up of chronic cardiac conditions.   Past Medical History    Past Medical History:  Diagnosis Date   Ascending Aortic Dilation    a. 09/2021 Echo: Asc Ao 26mm.   CAD (coronary artery disease)    a. 07/2021 Cath: LM nl, LAD 40m, D2 40, LCX 65p/m, OM1 90, RCA 188m w/ L-.R collats to RPDA from septal 1/2-->Med rx.   Chronic HFrEF (heart failure with reduced ejection fraction) ([place])    a. 07/2021 Echo: EF 30-35%, glob HK, mod LVH, GrII DD, nl RV fxn, mildly dil LA, mild-mod MR; b. 09/2021 Echo: EF 35-40%, glob HK, mod LVH, GrI DD, nl RV fxn, mildly dil RA, mild MR, Asc Ao 14mm.   CKD (chronic kidney disease) stage 2, GFR 60-89 ml/min    Crohn's disease ([place])    Hypertension    Mixed Ischemic & Nonischemic cardiomyopathy    a. 07/2021 Echo: EF 30-35%; b. 07/2021 Cath: Occluded RCA w/ mod LAD/LCX dzs, and severe OM1 dzs-->Med Rx; c. 09/2021 Echo: EF 35-40%.   PAH (pulmonary artery hypertension) ([place])    a. 07/2021 RHC: PA 67/30 (42).   Proteinuria    Past Surgical History:  Procedure Laterality Date   COLECTOMY     RIGHT/LEFT HEART CATH AND CORONARY ANGIOGRAPHY N/A 08/07/2021   Procedure: RIGHT/LEFT HEART CATH AND CORONARY ANGIOGRAPHY;  Surgeon: Nelva Bush, MD;  Location: [facility];  Service: Cardiovascular;  Laterality: N/A;    Allergies  Allergies  Allergen Reactions   Ibuprofen Hives and Other (See Comments)    Other reaction(s): GI Upset  (intolerance) Hives Tolerates Advil  Other reaction(s): Not available   Spironolactone     Hyperkalemia   Sulfacetamide Other (See Comments)   Neomycin-Bacitracin Zn-Polymyx     Other reaction(s): Other (See Comments)    History of Present Illness    Clarence [name] has a past medical history of: CAD. R/LHC 08/07/2021: Diffuse moderate to severe multivessel coronary artery disease including CTO of mid RCA with collaterals, diffuse plaque of LAD up to 60% in the mid vessel, 60 to 70% proximal/mid LCx, 90% OM1.  Moderately elevated left heart filling pressures (LVEDP/PCWP 25 mmHg).  Severely elevated right heart filling pressures (mean RA 16 mmHg, RVEDP 30 mmHg).  Moderate to severe pulmonary hypertension (mean PAP of 42 mmHg, PVR 4.0 WU).  Moderately reduced cardiac output/index (CO 4.2 L/min, CI 2.0L/min/m^2).  Recommendations for secondary prevention of CAD including high intensity statin therapy.  Diffuse nature of CAD is not ideal for percutaneous or surgical revascularization. Chronic systolic heart failure/ischemic cardiomyopathy. Echo 08/05/2021: EF 30 to 35%.  Global hypokinesis.  Moderate LVH.  Grade II DD.  Mild LAE.  Mild to moderate MR.  RA pressure 15 mmHg. Echo 10/07/2021: EF 35 to 40%.  Grade I DD.  Mild RAE.  Mild MR. Ascending aortic dilatation. Echo 10/07/2021: Mild dilatation of ascending aorta 40 mm. Pulmonary hypertension. Hypertension. Hyperlipidemia. Lipid panel 01/27/2022: LDL 70, HDL 35, TG 214, total 148. Crohn's disease.  S/p colectomy with ostomy.  [name] was first evaluated by by cardiology on 08/05/2021 during hospital admission for HFrEF.  He presented to the ED on 08/04/2021 for a 39-month history of shortness of breath that worsened over the prior 2 days. Echo and R/LHC as above. Patient was discharged on GDMT. In October 2023, patient endorsed not yet filling Entresto 97-103 and was, in fact, taking the 49-51 dose only once a day. Later in October  patient ran out of Lasix for about 1 week and complained of positional chest pain. Patient was last seen in the office by Ignacia Bayley, NP on 04/01/2022 for follow-up with continued non-adherence/poor insight of medications.   Patient is also followed by advanced heart failure clinic.  His last visit was with Allena Katz, [place] on 05/20/2022.  Carvedilol was restarted and Lasix was changed to as needed.  Continued confusion about medications.  Patient is not a candidate paramedicine.  Today, patient is doing well. He reports some mild chest pain with heavy exertion, particularly when he is walking outside moving fast. He does not have associated shortness of breath with this. He does occasionally feel breathless after walking to the bathroom at night but he is able to lay flat or on his side without difficulty. He states if he takes a few slow, deep breaths it resolves. He does not weigh every day and has not noticed lower extremity edema. He does occasionally feel like his toes may be a little puffy. He is taking all of his medications as instructed. He is taking his lasix daily, as he did not understand the instructions for prn dosing. He asks about going to the gym to start walking on the treadmill.   Home Medications    Current Meds  Medication Sig   acetaminophen (TYLENOL) 325 MG tablet Take 650 mg by mouth at bedtime.   aspirin EC 81 MG tablet Take 1 tablet (81 mg total) by mouth daily. Swallow whole.   atorvastatin (LIPITOR) 80 MG tablet Take 1 tablet (80 mg total) by mouth every evening.   carvedilol (COREG) 12.5 MG tablet Take 1 tablet (12.5 mg total) by mouth 2 (two) times daily with a meal.   clopidogrel (PLAVIX) 75 MG tablet Take 1 tablet (75 mg total) by mouth daily.   empagliflozin (JARDIANCE) 10 MG TABS tablet Take 10 mg by mouth daily.   ezetimibe (ZETIA) 10 MG tablet Take 1 tablet (10 mg total) by mouth daily.   famotidine (PEPCID) 10 MG tablet Take 10 mg by mouth at bedtime. As  needed   fenofibrate (TRICOR) 48 MG tablet Take 1 tablet (48 mg total) by mouth daily.   furosemide (LASIX) 20 MG tablet Take 20 mg by mouth daily.   sacubitril-valsartan (ENTRESTO) 97-103 MG Take 1 tablet by mouth 2 (two) times daily.    Family History    Family History  Problem Relation Age of Onset   Cancer Maternal Aunt    Heart disease Maternal Uncle    Pancreatic cancer Maternal Uncle    He indicated that his mother is alive. He indicated that his father is deceased. He indicated that the status of his maternal aunt is unknown. He indicated that only one of his two maternal uncles is alive.   Social History    Social History   Socioeconomic History   Marital status: Single    Spouse name: Not on file   Number of children: Not on file   Years of education: Not on file   Highest education level:  Not on file  Occupational History   Not on file  Tobacco Use   Smoking status: Former    Types: Cigars    Quit date: 08/13/2021    Years since quitting: 0.8   Smokeless tobacco: Not on file  Vaping Use   Vaping Use: Never used  Substance and Sexual Activity   Alcohol use: Not Currently    Alcohol/week: 1.0 standard drink of alcohol    Types: 1 Standard drinks or equivalent per week   Drug use: Never   Sexual activity: Not on file  Other Topics Concern   Not on file  Social History Narrative   Not on file   Social Determinants of Health   Financial Resource Strain: High Risk (01/29/2022)   Overall Financial Resource Strain (CARDIA)    Difficulty of Paying Living Expenses: Hard  Food Insecurity: No Food Insecurity (01/27/2022)   Hunger Vital Sign    Worried About Running Out of Food in the Last Year: Never true    Ran Out of Food in the Last Year: Never true  Transportation Needs: No Transportation Needs (01/27/2022)   PRAPARE - Hydrologist (Medical): No    Lack of Transportation (Non-Medical): No  Physical Activity: Not on file  Stress:  Not on file  Social Connections: Not on file  Intimate Partner Violence: Not on file     Review of Systems    General:  No chills, fever, night sweats or weight changes.  Cardiovascular:  No dyspnea on exertion, edema, orthopnea, palpitations, paroxysmal nocturnal dyspnea. Mild chest pain with heavy exertion.  Dermatological: No rash, lesions/masses Respiratory: No cough, dyspnea Urologic: No hematuria, dysuria Abdominal:   No nausea, vomiting, diarrhea, bright red blood per rectum, melena, or hematemesis Neurologic:  No visual changes, weakness, changes in mental status. All other systems reviewed and are otherwise negative except as noted above.  Physical Exam    VS:  BP 100/70 (BP Location: Left Arm, Patient Position: Sitting, Cuff Size: Normal)   Pulse (!) 58   Ht 5\' 10"  (1.778 m)   Wt 230 lb 6 oz (104.5 kg)   SpO2 98%   BMI 33.06 kg/m  , BMI Body mass index is 33.06 kg/m. GEN:  Well nourished, well developed, in no acute distress. HEENT: Normal. Neck: Supple, no JVD, carotid bruits, or masses. Cardiac: RRR, no murmurs, rubs, or gallops. No clubbing, cyanosis, edema.  Radials/DP/PT 2+ and equal bilaterally.  Respiratory:  Respirations regular and unlabored, clear to auscultation bilaterally. GI: Soft, nontender, nondistended. Ostomy right lower quadrant.  MS: No deformity or atrophy. Skin: Warm and dry, no rash. Neuro: Strength and sensation are intact. Psych: Normal affect.  Accessory Clinical Findings     Recent Labs: 08/04/2021: TSH 0.258 10/20/2021: Magnesium 2.0 12/09/2021: Hemoglobin 15.5; Platelets 179 01/27/2022: ALT 35 04/29/2022: B Natriuretic Peptide 171.4 05/20/2022: BUN 15; Creatinine, Ser 1.30; Potassium 3.6; Sodium 139   Recent Lipid Panel    Component Value Date/Time   CHOL 148 01/27/2022 1410   TRIG 214 (H) 01/27/2022 1410   HDL 35 (L) 01/27/2022 1410   CHOLHDL 4.2 01/27/2022 1410   VLDL 43 (H) 01/27/2022 1410   LDLCALC 70 01/27/2022 1410    LDLDIRECT 69.0 09/09/2021 0329       ECG personally reviewed by me today: Sinus bradycardia, HR 58, IVCD, T-wave inversion, lateral infarct.  No significant changes from 04/01/2022.    Assessment & Plan   CAD. LHC March 2023 showed diffuse moderate  to severe multivessel coronary disease. Given diffuse nature of the CAD he is not an ideal candidate for percutaneous or surgical revascularization. Patient reports mild chest pain with heavy exertion. He is interested in going to the gym to walk on the treadmill. He is instructed to slowly advance exercise as tolerated. Continue aspirin, Plavix, Lipitor, and Zetia.  Chronic systolic heart failure/ischemic cardiomyopathy/pulmonary hypertension. Echo May 2023 EF 35-40%, grade I DD, moderate LVH. RHC March 2023 showed moderately elevated left heart filling pressures, severely elevated right heart filling pressures and moderate to severe pulmonary hypertension. Patient denies shortness of breath at rest or DOE. Euvolemic and well compensated on exam. Patient is instructed to stop daily Lasix and begin weighing daily. He is educated on prn dosing of 1 tablet with weight gain of 3 lb in 24 hours and 5 lb in one week. He verbalized understanding. Continue carvedilol, Jardiance, and Entresto. Continue follow-up with advanced heart failure clinic.  Hypertension. BP today 100/70. Patient denies headaches or dizziness. Continue carvedilol and Entresto.  Hyperlipidemia. LDL September 2023 70, at goal. Continue Lipitor and Zetia.      Disposition: Weight daily. Stop daily lasix and take as needed as instructed by advanced heart failure clinic. Keep scheduled appointment with heart failure clinic in 2 weeks.  Return in 3 months or sooner as needed.    Etta Grandchild. Endiya Klahr, DNP, NP-C     06/05/2022, 2:02 PM [facility] [address] Office [phone] Fax [phone]

## 2022-06-05 ENCOUNTER — Encounter: Payer: Self-pay | Admitting: Nurse Practitioner

## 2022-06-05 ENCOUNTER — Ambulatory Visit: Payer: Medicaid Other | Attending: Nurse Practitioner | Admitting: Student

## 2022-06-05 ENCOUNTER — Other Ambulatory Visit (HOSPITAL_COMMUNITY): Payer: Self-pay

## 2022-06-05 VITALS — BP 100/70 | HR 58 | Ht 70.0 in | Wt 230.4 lb

## 2022-06-05 DIAGNOSIS — I255 Ischemic cardiomyopathy: Secondary | ICD-10-CM

## 2022-06-05 DIAGNOSIS — E785 Hyperlipidemia, unspecified: Secondary | ICD-10-CM | POA: Diagnosis not present

## 2022-06-05 DIAGNOSIS — I25118 Atherosclerotic heart disease of native coronary artery with other forms of angina pectoris: Secondary | ICD-10-CM

## 2022-06-05 DIAGNOSIS — I5022 Chronic systolic (congestive) heart failure: Secondary | ICD-10-CM

## 2022-06-05 DIAGNOSIS — I272 Pulmonary hypertension, unspecified: Secondary | ICD-10-CM | POA: Diagnosis not present

## 2022-06-05 DIAGNOSIS — I10 Essential (primary) hypertension: Secondary | ICD-10-CM | POA: Diagnosis not present

## 2022-06-05 NOTE — Patient Instructions (Signed)
Medication Instructions:  Your physician recommends that you continue on your current medications as directed. Please refer to the Current Medication list given to you today.  *If you need a refill on your cardiac medications before your next appointment, please call your pharmacy*   Lab Work: No labs ordered  If you have labs (blood work) drawn today and your tests are completely normal, you will receive your results only by: [place] (if you have MyChart) OR A paper copy in the mail If you have any lab test that is abnormal or we need to change your treatment, we will call you to review the results.   Testing/Procedures: No testing ordered  Follow-Up: At [facility], you and your health needs are our priority.  As part of our continuing mission to provide you with exceptional heart care, we have created designated Provider Care Teams.  These Care Teams include your primary Cardiologist (physician) and Advanced Practice Providers (APPs -  Physician Assistants and Nurse Practitioners) who all work together to provide you with the care you need, when you need it.  We recommend signing up for the patient portal called "MyChart".  Sign up information is provided on this After Visit Summary.  MyChart is used to connect with patients for Virtual Visits (Telemedicine).  Patients are able to view lab/test results, encounter notes, upcoming appointments, etc.  Non-urgent messages can be sent to your provider as well.   To learn more about what you can do with MyChart, go to [email or web address].    Your next appointment:   3 month(s)  Provider:   You may see Nelva Bush, MD or one of the following Advanced Practice Providers on your designated Care Team:   Murray Hodgkins, NP Christell Faith, PA-C Cadence Kathlen Mody, PA-C Gerrie Nordmann, NP   Other Instructions Please weights daily and keep a blood pressure log which was provided to you today.

## 2022-06-08 ENCOUNTER — Other Ambulatory Visit (HOSPITAL_COMMUNITY): Payer: Self-pay

## 2022-06-15 NOTE — Progress Notes (Signed)
PCP: Jon Billings, NP Cardiology: Dr. Saunders Revel HF Cardiology: Dr Aundra Dubin  52 y.o. with history of CAD, chronic systolic CHF, Crohns disease, CKD stage 3, and HTN was referred by Dr. Saunders Revel to HF clinic for evaluation of CHF.  Patient was admitted in 3/23 with newly found cardiomyopathy, CHF and atypical PNA.  Echo in 3/23 showed EF 30-35%, moderate LVH, normal RV. LHC showed occluded RCA with collaterals, 60% mLAD, 65% mLCx, 90% mid OM1.  RHC showed elevated filling pressures and low CI (2).  Patient was admitted again in 4/23 with CHF. He was admitted in 5/23 with hypertensive urgency and chest pain, CTA abdomen/pelvis did not show renal artery stenosis. He was admitted again in 6/23 with hypertensive urgency.  He quit smoking after 3/23 admission.  Rarely drinks ETOH and no drugs.  No strong family history of CAD or cardiomyopathy.    Sleep study was done, not suggestive of OSA.   Follow up 12/23, several medication discrepancies. Weight up with NYHA III symptoms. Entresto 97/103 restarted, not a candidate for paramedicine.  Today he returns for HF follow up. Overall feeling fine. He has SOB  walking further distances on flat ground. Main complaint is intermittent nausea, and BP elevated in the mornings. Feels palpitations in the mornings too. Denies abnormal bleeding, CP, dizziness, edema, or PND/Orthopnea. Appetite ok. No fever or chills. Weight at home 225 pounds. He has been off Lasix and Coreg since last visit. He lives with his mother and uncle.   REDs: 28%  Labs (5/23): LDL 43, TGs 382 Labs (6/23): K 4.2, creatinine 1.16 Labs (7/23): K 4.2, creatinine 1.42, LDL 70, TGs 214 Labs (11/23): K 4.1, creatinine 1.27 Labs (12/23): K 3.7, creatinine 1.03  PMH: 1. Crohns disease: s/p colectomy with ostomy.  2. CKD stage 3: History of hyperkalemia.  3. HTN: CT abdomen/pelvis with no evidence for renal artery stenosis.  4. Pulmonary nodules: Stable on last CT.  5. CAD: LHC 3/23 with CTO mid RCA  with collaterals, 60% mLAD, 65% mLCx, 90% mid OM1.  6. Chronic systolic CHF: Ischemic cardiomyopathy (cannot fully rule out nonischemic component).  - Echo (3/23): EF 30-35%, moderate LVH, normal RV. - RHC (3/23): mean RA 16, PA 67/30 mean 42, mean PCWP 25, CI 2, PVR 4 - Echo (5/23): EF 35-40%, global HK, moderate LVH, RV normal, mild MR 7. Sleep study negative for OSA.   SH: Single, quit smoking in 3/23, rare ETOH/no drugs, lives in [place] and works for a [employer].   FH: Uncles with "heart trouble"  ROS: All systems reviewed and negative except as per HPI.   Current Outpatient Medications  Medication Sig Dispense Refill   acetaminophen (TYLENOL) 325 MG tablet Take 650 mg by mouth at bedtime.     aspirin EC 81 MG tablet Take 1 tablet (81 mg total) by mouth daily. Swallow whole. 30 tablet 4   atorvastatin (LIPITOR) 80 MG tablet Take 1 tablet (80 mg total) by mouth every evening. 30 tablet 4   carvedilol (COREG) 12.5 MG tablet Take 1 tablet (12.5 mg total) by mouth 2 (two) times daily with a meal. 60 tablet 8   clopidogrel (PLAVIX) 75 MG tablet Take 1 tablet (75 mg total) by mouth daily. 30 tablet 6   empagliflozin (JARDIANCE) 10 MG TABS tablet Take 10 mg by mouth daily.     ezetimibe (ZETIA) 10 MG tablet Take 1 tablet (10 mg total) by mouth daily. 90 tablet 3   famotidine (PEPCID) 10 MG tablet  Take 10 mg by mouth at bedtime. As needed     fenofibrate (TRICOR) 48 MG tablet Take 1 tablet (48 mg total) by mouth daily. 30 tablet 8   furosemide (LASIX) 20 MG tablet Take 20 mg by mouth daily as needed. For weight gain of 3 lb in 24 hours or 5 lbs in one week     sacubitril-valsartan (ENTRESTO) 97-103 MG Take 1 tablet by mouth 2 (two) times daily. 180 tablet 3   No current facility-administered medications for this visit.   Wt Readings from Last 3 Encounters:  06/05/22 104.5 kg (230 lb 6 oz)  05/20/22 103.4 kg (228 lb)  04/29/22 106.3 kg (234 lb 6.4 oz)   There were no vitals  taken for this visit. Physical Exam General:  NAD. No resp difficulty HEENT: Normal Neck: Supple. No JVD. Carotids 2+ bilat; no bruits. No lymphadenopathy or thryomegaly appreciated. Cor: PMI nondisplaced. Regular rate & rhythm. No rubs, gallops or murmurs. Lungs: Clear Abdomen: Soft, nontender, nondistended. No hepatosplenomegaly. No bruits or masses. Good bowel sounds. Extremities: No cyanosis, clubbing, rash, edema Neuro: Alert & oriented x 3, cranial nerves grossly intact. Moves all 4 extremities w/o difficulty. Affect pleasant.  Assessment/Plan: 1. Chronic systolic CHF: Suspect primarily ischemic cardiomyopathy but cannot rule out component of nonischemic cardiomyopathy. RHC in 3/23 with elevated filling pressures and low CI, 2. Echo in 5/23 with EF 35-40%, normal RV.  NYHA II, he is not volume overloaded on exam. ReDs 28%. - Restart Coreg 12.5 mg bid. - Change Lasix to 20 mg PRN. - Continue Entresto 97/103 bid. - Continue Jardiance 10 mg daily.  - Off spironolactone with hyperkalemia, would like to restart in the future. Use of SGLT2 inhibitor may allow spironolactone use.  - EF appears to be just out of ICD range.  Would not be able to get ICD regardless until he gets insurance.    2. CAD: Cath in 3/23 with CTO mid RCA with collaterals, 60% mLAD, 65% mLCx, 90% mid OM1.  Managed medically.  No recent angina.  - Off Imdur to simplify regimen.  - Continue ASA 81 and Plavix 75 daily.  - Continue atorvastatin and Zetia, good LDL in 7/23.  - Continue Vascepa 2 g bid with elevated triglycerides.  3. HTN: BP elevated, restart Coreg as above.  He has significant difficulty managing his medications.  Ideal for paramedicine but unfortunately not a [facility] patient so unable to be part of this program.    Follow up in 3-4 weeks with APP  [place] [name]-BC 06/15/2022

## 2022-06-16 NOTE — Telephone Encounter (Signed)
Pt is covered for 6 months if you still would like him to have the Cardiac MRI.

## 2022-06-17 ENCOUNTER — Ambulatory Visit (HOSPITAL_COMMUNITY)
Admission: RE | Admit: 2022-06-17 | Discharge: 2022-06-17 | Disposition: A | Discharge status: Home / Self Care | Payer: Medicaid Other | Source: Ambulatory Visit | Attending: Family Medicine | Admitting: Family Medicine

## 2022-06-17 ENCOUNTER — Other Ambulatory Visit (HOSPITAL_COMMUNITY): Payer: Self-pay

## 2022-06-17 ENCOUNTER — Encounter (HOSPITAL_COMMUNITY): Payer: Self-pay

## 2022-06-17 ENCOUNTER — Ambulatory Visit: Payer: Self-pay | Admitting: Pulmonary Disease

## 2022-06-17 VITALS — BP 130/90 | HR 75 | Wt 228.0 lb

## 2022-06-17 DIAGNOSIS — I25118 Atherosclerotic heart disease of native coronary artery with other forms of angina pectoris: Secondary | ICD-10-CM

## 2022-06-17 DIAGNOSIS — I255 Ischemic cardiomyopathy: Secondary | ICD-10-CM | POA: Insufficient documentation

## 2022-06-17 DIAGNOSIS — Z597 Insufficient social insurance and welfare support: Secondary | ICD-10-CM | POA: Insufficient documentation

## 2022-06-17 DIAGNOSIS — Z7984 Long term (current) use of oral hypoglycemic drugs: Secondary | ICD-10-CM | POA: Insufficient documentation

## 2022-06-17 DIAGNOSIS — Z87891 Personal history of nicotine dependence: Secondary | ICD-10-CM | POA: Diagnosis not present

## 2022-06-17 DIAGNOSIS — Z139 Encounter for screening, unspecified: Secondary | ICD-10-CM | POA: Diagnosis not present

## 2022-06-17 DIAGNOSIS — I2510 Atherosclerotic heart disease of native coronary artery without angina pectoris: Secondary | ICD-10-CM | POA: Diagnosis not present

## 2022-06-17 DIAGNOSIS — Z7902 Long term (current) use of antithrombotics/antiplatelets: Secondary | ICD-10-CM | POA: Insufficient documentation

## 2022-06-17 DIAGNOSIS — I10 Essential (primary) hypertension: Secondary | ICD-10-CM

## 2022-06-17 DIAGNOSIS — K5090 Crohn's disease, unspecified, without complications: Secondary | ICD-10-CM | POA: Diagnosis not present

## 2022-06-17 DIAGNOSIS — Z7982 Long term (current) use of aspirin: Secondary | ICD-10-CM | POA: Insufficient documentation

## 2022-06-17 DIAGNOSIS — I130 Hypertensive heart and chronic kidney disease with heart failure and stage 1 through stage 4 chronic kidney disease, or unspecified chronic kidney disease: Secondary | ICD-10-CM | POA: Insufficient documentation

## 2022-06-17 DIAGNOSIS — Z79899 Other long term (current) drug therapy: Secondary | ICD-10-CM | POA: Diagnosis not present

## 2022-06-17 DIAGNOSIS — N183 Chronic kidney disease, stage 3 unspecified: Secondary | ICD-10-CM | POA: Insufficient documentation

## 2022-06-17 DIAGNOSIS — E781 Pure hyperglyceridemia: Secondary | ICD-10-CM | POA: Diagnosis not present

## 2022-06-17 DIAGNOSIS — I5022 Chronic systolic (congestive) heart failure: Secondary | ICD-10-CM

## 2022-06-17 LAB — BASIC METABOLIC PANEL
Anion gap: 9 (ref 5–15)
BUN: 19 mg/dL (ref 6–20)
CO2: 27 mmol/L (ref 22–32)
Calcium: 9.4 mg/dL (ref 8.9–10.3)
Chloride: 103 mmol/L (ref 98–111)
Creatinine, Ser: 1.36 mg/dL — ABNORMAL HIGH (ref 0.61–1.24)
GFR, Estimated: >60 mL/min (ref >60)
Glucose, Bld: 134 mg/dL — ABNORMAL HIGH (ref 70–99)
Potassium: 3.7 mmol/L (ref 3.5–5.1)
Sodium: 139 mmol/L (ref 135–145)

## 2022-06-17 MED ORDER — SPIRONOLACTONE 25 MG PO TABS
12.5000 mg | ORAL_TABLET | Freq: Every day | ORAL | 3 refills | Status: DC
  Filled 2022-06-17: qty 45, 90d supply, fill #0

## 2022-06-17 MED ORDER — CARVEDILOL 12.5 MG PO TABS
18.7500 mg | ORAL_TABLET | Freq: Two times a day (BID) | ORAL | 3 refills | Status: DC
  Filled 2022-06-17 – 2022-07-20 (×3): qty 90, 30d supply, fill #0
  Filled 2022-09-01 (×2): qty 90, 30d supply, fill #1

## 2022-06-17 NOTE — Patient Instructions (Signed)
INCREASE Coreg to 18.75 (one and one half tab) twice a day START Spironolactone 12.5 mg one half tab daily  Labs today We will only contact you if something comes back abnormal or we need to make some changes. Otherwise no news is good news!  Please be sure to establish care with a primary care provider  Your physician recommends that you schedule a follow-up appointment in: 3 months with Dr Aundra Dubin   Do the following things EVERYDAY: Weigh yourself in the morning before breakfast. Write it down and keep it in a log. Take your medicines as prescribed Eat low salt foods--Limit salt (sodium) to 2000 mg per day.  Stay as active as you can everyday Limit all fluids for the day to less than 2 liters  At the [facility], you and your health needs are our priority. As part of our continuing mission to provide you with exceptional heart care, we have created designated Provider Care Teams. These Care Teams include your primary Cardiologist (physician) and Advanced Practice Providers (APPs- Physician Assistants and Nurse Practitioners) who all work together to provide you with the care you need, when you need it.   You may see any of the following providers on your designated Care Team at your next follow up: Dr Glori Bickers Dr Loralie Champagne Dr. Roxana Hires, NP Lyda Jester, [facility], [place] Forestine Na, NP Audry Riles, PharmD   Please be sure to bring in all your medications bottles to every appointment.    Thank you for choosing [facility]   If you have any questions or concerns before your next appointment please send [place] a message through [place] or call our office at [phone].    TO LEAVE A MESSAGE FOR THE NURSE SELECT OPTION 2, PLEASE LEAVE A MESSAGE INCLUDING: YOUR NAME DATE OF BIRTH CALL BACK NUMBER REASON FOR CALL**this is important as we prioritize the call  backs  YOU WILL RECEIVE A CALL BACK THE SAME DAY AS LONG AS YOU CALL BEFORE 4:00 PM

## 2022-06-17 NOTE — Progress Notes (Signed)
H&V Care Navigation CSW Progress Note  Clinical Social Worker consulted to speak with pt about Medicaid/Disability updates.  Pt reports he still has not applied for Medicaid- has caseworker who was supposed to help him once he would qualify (was over reserve when last we spoke) but hasn't reached out to her yet- Pt plans to reach out to her today and see if she  can still help- CSW provided with flyer on how else he can complete Medicaid app in case she is unable to take the case.  Pt also reports he has applied for disability and is working with a Chief Executive Officer- nothing CSW can do at this time to assist with process.  Pt reports no other needs at this time  [place]: No Food Insecurity (01/27/2022)  Housing: Low Risk  (01/27/2022)  Transportation Needs: No Transportation Needs (01/27/2022)  Depression (PHQ2-9): Low Risk  (10/29/2021)  Financial Resource Strain: High Risk (01/29/2022)  Tobacco Use: Medium Risk (06/17/2022)   Jorge Ny, [facility] Desk#: [phone] Cell#: [phone]

## 2022-06-26 ENCOUNTER — Other Ambulatory Visit
Admission: RE | Admit: 2022-06-26 | Discharge: 2022-06-26 | Disposition: A | Discharge status: Home / Self Care | Payer: Medicaid Other | Source: Ambulatory Visit | Attending: Family Medicine | Admitting: Family Medicine

## 2022-06-26 DIAGNOSIS — I5022 Chronic systolic (congestive) heart failure: Secondary | ICD-10-CM | POA: Diagnosis not present

## 2022-06-26 LAB — BASIC METABOLIC PANEL
Anion gap: 6 (ref 5–15)
BUN: 20 mg/dL (ref 6–20)
CO2: 29 mmol/L (ref 22–32)
Calcium: 9.2 mg/dL (ref 8.9–10.3)
Chloride: 103 mmol/L (ref 98–111)
Creatinine, Ser: 1.45 mg/dL — ABNORMAL HIGH (ref 0.61–1.24)
GFR, Estimated: 58 mL/min — ABNORMAL LOW (ref >60)
Glucose, Bld: 109 mg/dL — ABNORMAL HIGH (ref 70–99)
Potassium: 4.1 mmol/L (ref 3.5–5.1)
Sodium: 138 mmol/L (ref 135–145)

## 2022-07-07 ENCOUNTER — Other Ambulatory Visit (HOSPITAL_COMMUNITY): Payer: Self-pay

## 2022-07-15 ENCOUNTER — Other Ambulatory Visit (HOSPITAL_COMMUNITY): Payer: Self-pay

## 2022-07-15 ENCOUNTER — Encounter: Payer: Self-pay | Admitting: Family

## 2022-07-15 ENCOUNTER — Ambulatory Visit: Payer: Medicaid Other | Admitting: Family

## 2022-07-15 VITALS — BP 160/100 | HR 74 | Temp 98.0°F | Resp 18 | Ht 70.0 in | Wt 227.0 lb

## 2022-07-15 DIAGNOSIS — I272 Pulmonary hypertension, unspecified: Secondary | ICD-10-CM

## 2022-07-15 DIAGNOSIS — I2510 Atherosclerotic heart disease of native coronary artery without angina pectoris: Secondary | ICD-10-CM

## 2022-07-15 DIAGNOSIS — Z7689 Persons encountering health services in other specified circumstances: Secondary | ICD-10-CM | POA: Diagnosis not present

## 2022-07-15 DIAGNOSIS — I10 Essential (primary) hypertension: Secondary | ICD-10-CM

## 2022-07-15 DIAGNOSIS — Z933 Colostomy status: Secondary | ICD-10-CM | POA: Diagnosis not present

## 2022-07-15 DIAGNOSIS — N182 Chronic kidney disease, stage 2 (mild): Secondary | ICD-10-CM | POA: Diagnosis not present

## 2022-07-15 DIAGNOSIS — E782 Mixed hyperlipidemia: Secondary | ICD-10-CM | POA: Diagnosis not present

## 2022-07-15 DIAGNOSIS — I77819 Aortic ectasia, unspecified site: Secondary | ICD-10-CM | POA: Diagnosis not present

## 2022-07-15 DIAGNOSIS — I5022 Chronic systolic (congestive) heart failure: Secondary | ICD-10-CM

## 2022-07-15 MED ORDER — SPIRONOLACTONE 25 MG PO TABS
25.0000 mg | ORAL_TABLET | Freq: Every day | ORAL | 1 refills | Status: DC
  Filled 2022-07-15 – 2022-07-20 (×2): qty 90, 90d supply, fill #0

## 2022-07-15 NOTE — Progress Notes (Addendum)
Provider: Marlowe Sax [name]-C   Friend Dorfman, Nelda Bucks, NP  Clarence [name] Care Team: Latika Kronick, Nelda Bucks, NP as PCP - General (Family Medicine) End, Harrell Gave, MD as PCP - Cardiology (Cardiology) Larey Dresser, MD as PCP - Advanced Heart Failure (Cardiology)  Extended Emergency Contact Information Primary Emergency Contact: Bertrand Phone: [phone] Relation: Mother  Code Status:  Full Code  Goals of care: Advanced Directive information    07/15/2022    8:39 AM  Advanced Directives  Does Clarence [name] Have a Medical Advance Directive? No  Would Clarence [name] like information on creating a medical advance directive? No - Clarence [name] declined     Chief Complaint  Clarence [name] presents with   Clarence Clarence [name] here to establish care, has medication bottles at initial appointment. [facility] verified Clarence [name] is having elevated BP for the last week 130's to mid 180's , also having hallucinations and heartburn type feeling when this happens. HX of heart attacks and other heart related issues.   Quality Metric Gaps    Clarence [name] is due for colonoscopy screening   Immunizations    Clarence [name] is due for 2nd shingrix vaccine and updated covid booster    HPI:  Pt is a 52 y.o. male seen today establish care here at [facility] and Adult  care for medical management of chronic diseases.  Has some medical history of essential hypertension, coronary artery disease, chronic heart failure EF 35-45% in 10/04/2021, chronic kidney disease, Crohn's disease with colostomy bag 1990, pulmonary artery hypertension, ascending aortic dilatation, hyperlipidemia among other conditions.states had heart attack in 2023 in spring and second one in mid summer followed up by Dr.Christopher End   States systolic blood pressure at home runs in the 130s to 180s initially has had been feeling when blood pressure is high.He denies any headache,dizziness,vision changes,fatigue,chest tightness,palpitation or chest pain.   Has  colostomy bag due to crohn's disease. Denies any rah or skin erythema. Also denies any diarrhea or constipation.   Smoked cigarettes 3 to 5 pack/day for about 10 years.  He follow-ups with pulmonary specialist though states missed appointment recently June 17, 2022.  He plans to reschedule appointment.  States gets shortness of breath on exertion while shopping at [facility] usually takes him longer than usual.  He denies any lower extremity edema or abrupt weight gain.   He does not drink any alcohol. Due for Shingrix and COVID-19 vaccine.  Made aware to get vaccines at the pharmacy.   Past Medical History:  Diagnosis Date   Ascending Aortic Dilation    a. 09/2021 Echo: Asc Ao 14m.   CAD (coronary artery disease)    a. 07/2021 Cath: LM nl, LAD 630mD2 40, LCX 65p/m, OM1 90, RCA 10046m L-.R collats to RPDA from septal 1/2-->Med rx.   Chronic H[place]EF (heart failure with reduced [place] fraction) [place]  a. 07/2021 Echo: EF 30-35%, glob HK, mod LVH, GrII DD, nl RV fxn, mildly dil LA, mild-mod MR; b. 09/2021 Echo: EF 35-40%, glob HK, mod LVH, GrI DD, nl RV fxn, mildly dil RA, mild MR, Asc Ao 72m13m CKD (chronic kidney disease) stage 2, GFR 60-89 ml/min    Crohn's disease (HCC)[place] Hypertension    Mixed Ischemic & Nonischemic cardiomyopathy    a. 07/2021 Echo: EF 30-35%; b. 07/2021 Cath: Occluded RCA w/ mod LAD/LCX dzs, and severe OM1 dzs-->Med Rx; c. 09/2021 Echo: EF 35-40%.   PAH (pulmonary artery hypertension) (HCC)[place]  a. 07/2021 RHC: PA 67/30 (42).   Proteinuria    Past Surgical History:  Procedure Laterality Date   COLECTOMY     RIGHT/LEFT HEART CATH AND CORONARY ANGIOGRAPHY N/A 08/07/2021   Procedure: RIGHT/LEFT HEART CATH AND CORONARY ANGIOGRAPHY;  Surgeon: Nelva Bush, MD;  Location: [facility];  Service: Cardiovascular;  Laterality: N/A;    Allergies  Allergen Reactions   Ibuprofen Hives and Other (See Comments)    Other reaction(s): GI Upset  (intolerance) Hives Tolerates Advil  Other reaction(s): Not available   Spironolactone     Hyperkalemia   Sulfacetamide Other (See Comments)   Neomycin-Bacitracin Zn-Polymyx     Other reaction(s): Other (See Comments)    Allergies as of 07/15/2022       Reactions   Ibuprofen Hives, Other (See Comments)   Other reaction(s): GI Upset (intolerance) Hives Tolerates Advil Other reaction(s): Not available   Spironolactone    Hyperkalemia   Sulfacetamide Other (See Comments)   Neomycin-bacitracin Zn-polymyx    Other reaction(s): Other (See Comments)        Medication List        Accurate as of July 15, 2022 11:59 PM. If you have any questions, ask your nurse or doctor.          acetaminophen 325 MG tablet Commonly known as: TYLENOL Take 650 mg by mouth at bedtime.   Aspirin Low Dose 81 MG tablet Generic drug: aspirin EC Take 1 tablet (81 mg total) by mouth daily. Swallow whole.   atorvastatin 80 MG tablet Commonly known as: LIPITOR Take 1 tablet (80 mg total) by mouth every evening.   carvedilol 12.5 MG tablet Commonly known as: COREG Take 1.5 tablets (18.75 mg total) by mouth 2 (two) times daily with a meal.   clopidogrel 75 MG tablet Commonly known as: PLAVIX Take 1 tablet (75 mg total) by mouth daily.   Entresto 97-103 MG Generic drug: sacubitril-valsartan Take 1 tablet by mouth 2 (two) times daily.   ezetimibe 10 MG tablet Commonly known as: ZETIA Take 1 tablet (10 mg total) by mouth daily.   famotidine 10 MG tablet Commonly known as: PEPCID Take 10 mg by mouth at bedtime. As needed   fenofibrate 48 MG tablet Commonly known as: Tricor Take 1 tablet (48 mg total) by mouth daily.   furosemide 20 MG tablet Commonly known as: LASIX Take 20 mg by mouth daily as needed. For weight gain of 3 lb in 24 hours or 5 lbs in one week   Jardiance 10 MG Tabs tablet Generic drug: empagliflozin Take 10 mg by mouth daily.   spironolactone 25 MG  tablet Commonly known as: ALDACTONE Take 1 tablet (25 mg total) by mouth daily. What changed: how much to take Changed by: Sandrea Hughs, NP        Review of Systems  Constitutional:  Negative for appetite change, chills, fatigue, fever and unexpected weight change.  HENT:  Negative for congestion, dental problem, ear discharge, ear pain, facial swelling, hearing loss, nosebleeds, postnasal drip, rhinorrhea, sinus pressure, sinus pain, sneezing, sore throat, tinnitus and trouble swallowing.   Eyes:  Negative for pain, discharge, redness, itching and visual disturbance.  Respiratory:  Negative for cough, chest tightness and wheezing.        Shortness of breath with exertion when walking at [employer] but no shortness of breath at rest.  Cardiovascular:  Negative for chest pain, palpitations and leg swelling.  Gastrointestinal:  Negative for abdominal distention, abdominal pain,  blood in stool, constipation, diarrhea, nausea and vomiting.       Right lower quadrant colostomy bag   Endocrine: Negative for cold intolerance, heat intolerance, polydipsia, polyphagia and polyuria.  Genitourinary:  Negative for difficulty urinating, dysuria, flank pain, frequency and urgency.  Musculoskeletal:  Negative for arthralgias, back pain, gait problem, joint swelling, myalgias, neck pain and neck stiffness.  Skin:  Negative for color change, pallor, rash and wound.  Neurological:  Negative for dizziness, syncope, speech difficulty, weakness, light-headedness, numbness and headaches.  Hematological:  Does not bruise/bleed easily.  Psychiatric/Behavioral:  Negative for agitation, behavioral problems, confusion, hallucinations, self-injury, sleep disturbance and suicidal ideas. The Clarence [name] is not nervous/anxious.     Immunization History  Administered Date(s) Administered   Moderna SARS-COV2 Booster Vaccination 04/21/2020, 01/07/2021   Tdap 05/18/2013   Zoster Recombinat (Shingrix) 01/07/2021    Pertinent  Health Maintenance Due  Topic Date Due   INFLUENZA VACCINE  08/16/2022 (Originally 12/16/2021)      10/22/2021    8:00 PM 10/23/2021    8:00 AM 10/29/2021   10:41 AM 01/11/2022   12:56 AM 07/15/2022    8:39 AM  Fall Risk  Falls in the past year?   0  0  Was there an injury with Fall?   0  0  Fall Risk Category Calculator   0  0  Fall Risk Category (Retired)   Low    (RETIRED) Clarence [name] Fall Risk Level Low fall risk Low fall risk Low fall risk Low fall risk   Clarence [name] at Risk for Falls Due to     No Fall Risks  Fall risk Follow up   Falls evaluation completed  Falls evaluation completed   Functional Status Survey:    Vitals:   07/15/22 1302 07/15/22 1423  BP: (!) 140/100 (!) 160/100  Pulse: 74   Resp: 18   Temp: 98 F (36.7 C)   SpO2: 97%   Weight: 227 lb (103 kg)   Height: '5\' 10"'$  (1.778 m)    Body mass index is 32.57 kg/m. Physical Exam Vitals reviewed.  Constitutional:      General: He is not in acute distress.    Appearance: Normal appearance. He is obese. He is not ill-appearing or diaphoretic.  HENT:     Head: Normocephalic.     Right Ear: Tympanic membrane, ear canal and external ear normal. There is no impacted cerumen.     Left Ear: Tympanic membrane, ear canal and external ear normal. There is no impacted cerumen.     Nose: Nose normal. No congestion or rhinorrhea.     Mouth/Throat:     Mouth: Mucous membranes are moist.     Pharynx: Oropharynx is clear. No oropharyngeal exudate or posterior oropharyngeal erythema.  Eyes:     General: No scleral icterus.       Right eye: No discharge.        Left eye: No discharge.     Extraocular Movements: Extraocular movements intact.     Conjunctiva/sclera: Conjunctivae normal.     Pupils: Pupils are equal, round, and reactive to light.  Neck:     Vascular: No carotid bruit.  Cardiovascular:     Rate and Rhythm: Normal rate and regular rhythm.     Pulses: Normal pulses.     Heart sounds: Normal heart sounds.  No murmur heard.    No friction rub. No gallop.  Pulmonary:     Effort: Pulmonary effort is normal. No respiratory distress.  Breath sounds: Normal breath sounds. No wheezing, rhonchi or rales.  Chest:     Chest wall: No tenderness.  Abdominal:     General: The ostomy site is clean. Bowel sounds are normal. There is no distension.     Palpations: Abdomen is soft. There is no mass.     Tenderness: There is no abdominal tenderness. There is no right CVA tenderness, left CVA tenderness, guarding or rebound.     Comments: RLQ colostomy patent   Musculoskeletal:        General: No swelling or tenderness. Normal range of motion.     Cervical back: Normal range of motion. No rigidity or tenderness.     Right lower leg: No edema.     Left lower leg: No edema.  Lymphadenopathy:     Cervical: No cervical adenopathy.  Skin:    General: Skin is warm and dry.     Coloration: Skin is not pale.     Findings: No bruising, erythema, lesion or rash.  Neurological:     Mental Status: He is alert and oriented to person, place, and time.     Cranial Nerves: No cranial nerve deficit.     Sensory: No sensory deficit.     Motor: No weakness.     Coordination: Coordination normal.     Gait: Gait normal.  Psychiatric:        Mood and Affect: Mood normal.        Speech: Speech normal.        Behavior: Behavior normal.        Thought Content: Thought content normal.        Judgment: Judgment normal.     Labs reviewed: Recent Labs    09/10/21 0444 09/11/21 0602 09/14/21 2104 10/11/21 0510 10/14/21 1057 10/20/21 2317 10/21/21 0850 06/17/22 1412 06/26/22 1502 07/15/22 1425  NA 139  --    < > 140   < >  --    < > 139 138 143  K 4.2  --    < > 4.7   < >  --    < > 3.7 4.1 4.7  CL 105  --    < > 108   < >  --    < > 103 103 107  CO2 25  --    < > 26   < >  --    < > '27 29 25  '$ GLUCOSE 97  --    < > 100*   < >  --    < > 134* 109* 103*  BUN 29*  --    < > 28*   < >  --    < > '19 20 17   '$ CREATININE 1.43* 1.32*   < > 1.38*   < >  --    < > 1.36* 1.45* 1.37*  CALCIUM 9.5  --    < > 9.5   < >  --    < > 9.4 9.2 9.9  MG 2.0 2.0  --   --   --  2.0  --   --   --   --   PHOS  --   --   --  4.0  --   --   --   --   --   --    < > = values in this interval not displayed.   Recent Labs    09/09/21 0329 09/14/21 2104 10/11/21 0510 01/27/22 1400 07/15/22  1425  AST 41 44*  --  25 26  ALT 53* 59*  --  35 42  ALKPHOS 112 86  --  87  --   BILITOT 0.6 1.0  --  1.3* 0.6  PROT 7.6 6.6  --  6.7 7.0  ALBUMIN 3.8 3.6 3.7 3.6  --    Recent Labs    09/14/21 2104 09/15/21 0326 10/14/21 1057 10/20/21 1943 10/21/21 0545 12/09/21 1435 07/15/22 1425  WBC 7.1   < > 6.0   < > 8.0 8.3 7.3  NEUTROABS 5.1  --  3.8  --   --   --  4,636  HGB 14.3   < > 13.2   < > 12.0* 15.5 15.5  HCT 42.7   < > 39.3   < > 34.9* 46.2 46.1  MCV 85.2   < > 83.8   < > 82.1 85.4 83.1  PLT 189   < > 176   < > 159 179 237   < > = values in this interval not displayed.   Lab Results  Component Value Date   TSH 0.86 07/15/2022   Lab Results  Component Value Date   HGBA1C 5.4 08/05/2021   Lab Results  Component Value Date   CHOL 147 07/15/2022   HDL 39 (L) 07/15/2022   LDLCALC 81 07/15/2022   LDLDIRECT 69.0 09/09/2021   TRIG 177 (H) 07/15/2022   CHOLHDL 3.8 07/15/2022    Significant Diagnostic Results in last 30 days:  No results found.  Assessment/Plan 1. Essential hypertension B/p elevated this visit.Recheck still high.Has been out of his medication.Advised to take medication as prescribed then follow up in 2 weeks to recheck B/p   -Continue on spironolactone, Entresto, furosemide and carvedilol -On atorvastatin and aspirin for cardiovascular event prevention - TSH - COMPLETE METABOLIC PANEL WITH GFR - CBC with Differential/Platelet  2. Pulmonary hypertension (HCC) No signs of fluid overload  Continue current medication as above.   3. Mixed hyperlipidemia Continue on atorvastatin,  fenofibrate and Zetia - Lipid panel  4. Encounter to establish care Available records reviewed, immunization up-to-date except due for Shingrix and COVID 19 booster vaccine.  Advised to get vaccine at his pharmacy.  Also recommended fasting blood work.  5. Chronic HFrEF (heart failure with reduced ejection fraction) (HCC) No signs of fluid overload.  Bilateral lung clear to auscultation - Keep legs elevated when seated to keep swelling down  - check weight at least three times per week and notify provider for any abrupt weight gain > 3 lbs  - Reduce salt intake in diet  -Continue on the furosemide - COMPLETE METABOLIC PANEL WITH GFR - CBC with Differential/Platelet  6. CKD (chronic kidney disease) stage 2, GFR 60-89 ml/min CR on chart 1.45  - COMPLETE METABOLIC PANEL WITH GFR  7. Aortic dilatation (HCC) Continue to control high risk factors   8. Coronary artery disease involving native coronary artery of native heart, unspecified whether angina present Chest pain free -Continue on atorvastatin and aspirin -Blood pressure control not had ran out of the medication will take medication then follow-up in 2 weeks for recheck  9. Colostomy present ([place]) Patent -Ostomy site without any signs of infection or rash  Family/ staff Communication: Reviewed plan of care with Clarence [name] verbalized understanding  Labs/tests ordered:  - CBC with Differential/Platelet - CMP with eGFR([facility]) - TSH - Lipid panel  Next Appointment : Return in about 2 weeks (around 07/29/2022) for Blood pressure .   Shelsie Tijerino  Shelva Majestic, NP

## 2022-07-16 LAB — CBC WITH DIFFERENTIAL/PLATELET
Absolute Monocytes: 650 cells/uL (ref 200–950)
Basophils Absolute: 37 cells/uL (ref 0–200)
Basophils Relative: 0.5 %
Eosinophils Absolute: 219 cells/uL (ref 15–500)
Eosinophils Relative: 3 %
HCT: 46.1 % (ref 38.5–50.0)
Hemoglobin: 15.5 g/dL (ref 13.2–17.1)
Lymphs Abs: 1759 cells/uL (ref 850–3900)
MCH: 27.9 pg (ref 27.0–33.0)
MCHC: 33.6 g/dL (ref 32.0–36.0)
MCV: 83.1 fL (ref 80.0–100.0)
MPV: 10.8 fL (ref 7.5–12.5)
Monocytes Relative: 8.9 %
Neutro Abs: 4636 cells/uL (ref 1500–7800)
Neutrophils Relative %: 63.5 %
Platelets: 237 10*3/uL (ref 140–400)
RBC: 5.55 10*6/uL (ref 4.20–5.80)
RDW: 13.3 % (ref 11.0–15.0)
Total Lymphocyte: 24.1 %
WBC: 7.3 10*3/uL (ref 3.8–10.8)

## 2022-07-16 LAB — COMPLETE METABOLIC PANEL WITH GFR
AG Ratio: 1.7 (calc) (ref 1.0–2.5)
ALT: 42 U/L (ref 9–46)
AST: 26 U/L (ref 10–35)
Albumin: 4.4 g/dL (ref 3.6–5.1)
Alkaline phosphatase (APISO): 88 U/L (ref 35–144)
BUN/Creatinine Ratio: 12 (calc) (ref 6–22)
BUN: 17 mg/dL (ref 7–25)
CO2: 25 mmol/L (ref 20–32)
Calcium: 9.9 mg/dL (ref 8.6–10.3)
Chloride: 107 mmol/L (ref 98–110)
Creat: 1.37 mg/dL — ABNORMAL HIGH (ref 0.70–1.30)
Globulin: 2.6 g/dL (calc) (ref 1.9–3.7)
Glucose, Bld: 103 mg/dL — ABNORMAL HIGH (ref 65–99)
Potassium: 4.7 mmol/L (ref 3.5–5.3)
Sodium: 143 mmol/L (ref 135–146)
Total Bilirubin: 0.6 mg/dL (ref 0.2–1.2)
Total Protein: 7 g/dL (ref 6.1–8.1)
eGFR: 62 mL/min/{1.73_m2} (ref >=60)

## 2022-07-16 LAB — TSH: TSH: 0.86 mIU/L (ref 0.40–4.50)

## 2022-07-16 LAB — LIPID PANEL
Cholesterol: 147 mg/dL (ref <200)
HDL: 39 mg/dL — ABNORMAL LOW (ref >=40)
LDL Cholesterol (Calc): 81 mg/dL (calc)
Non-HDL Cholesterol (Calc): 108 mg/dL (calc) (ref <130)
Total CHOL/HDL Ratio: 3.8 (calc) (ref <5.0)
Triglycerides: 177 mg/dL — ABNORMAL HIGH (ref <150)

## 2022-07-20 ENCOUNTER — Other Ambulatory Visit (HOSPITAL_COMMUNITY): Payer: Self-pay

## 2022-07-22 ENCOUNTER — Other Ambulatory Visit: Payer: Self-pay | Admitting: *Deleted

## 2022-07-22 DIAGNOSIS — I5042 Chronic combined systolic (congestive) and diastolic (congestive) heart failure: Secondary | ICD-10-CM

## 2022-07-23 ENCOUNTER — Encounter: Payer: Self-pay | Admitting: *Deleted

## 2022-07-28 ENCOUNTER — Encounter: Payer: Medicaid Other | Attending: Internal Medicine | Admitting: *Deleted

## 2022-07-28 ENCOUNTER — Encounter: Payer: Self-pay | Admitting: Family

## 2022-07-28 DIAGNOSIS — Z5189 Encounter for other specified aftercare: Secondary | ICD-10-CM | POA: Insufficient documentation

## 2022-07-28 DIAGNOSIS — I5042 Chronic combined systolic (congestive) and diastolic (congestive) heart failure: Secondary | ICD-10-CM | POA: Insufficient documentation

## 2022-07-28 NOTE — Progress Notes (Signed)
Initial orientation completed. Diagnosis can be found in [facility] 2/28. EP Orientation scheduled for Thursday 3/14 at 9:30am.

## 2022-07-29 ENCOUNTER — Ambulatory Visit: Payer: Medicaid Other | Admitting: Family

## 2022-07-29 ENCOUNTER — Other Ambulatory Visit (HOSPITAL_COMMUNITY): Payer: Self-pay

## 2022-07-29 ENCOUNTER — Ambulatory Visit: Payer: Medicaid Other

## 2022-07-29 ENCOUNTER — Encounter: Payer: Self-pay | Admitting: Family

## 2022-07-29 VITALS — BP 138/70 | HR 68 | Temp 97.5°F | Resp 16 | Ht 70.0 in | Wt 226.4 lb

## 2022-07-29 DIAGNOSIS — I2510 Atherosclerotic heart disease of native coronary artery without angina pectoris: Secondary | ICD-10-CM | POA: Diagnosis not present

## 2022-07-29 DIAGNOSIS — I10 Essential (primary) hypertension: Secondary | ICD-10-CM

## 2022-07-29 MED ORDER — NITROGLYCERIN 0.4 MG SL SUBL
0.4000 mg | SUBLINGUAL_TABLET | SUBLINGUAL | 3 refills | Status: DC | PRN
  Filled 2022-07-29: qty 25, 3d supply, fill #0
  Filled 2022-11-30: qty 25, 3d supply, fill #1
  Filled 2023-03-01: qty 25, 3d supply, fill #2
  Filled 2023-06-22: qty 25, 3d supply, fill #3

## 2022-07-29 MED ORDER — NITROGLYCERIN 0.3 MG SL SUBL
0.3000 mg | SUBLINGUAL_TABLET | SUBLINGUAL | 12 refills | Status: DC | PRN
  Filled 2022-07-29: qty 90, 1d supply, fill #0

## 2022-07-29 MED ORDER — ZOSTER VAC RECOMB ADJUVANTED 50 MCG/0.5ML IM SUSR
0.5000 mL | Freq: Once | INTRAMUSCULAR | 0 refills | Status: AC
  Filled 2022-07-29: qty 0.5, 1d supply, fill #0

## 2022-07-29 NOTE — Progress Notes (Signed)
Provider: Marlowe Sax [name]-C  Carly Applegate, Nelda Bucks, NP  Patient Care Team: Darleth Eustache, Nelda Bucks, NP as PCP - General (Family Medicine) End, Harrell Gave, MD as PCP - Cardiology (Cardiology) Larey Dresser, MD as PCP - Advanced Heart Failure (Cardiology)  Extended Emergency Contact Information Primary Emergency Contact: Odell Phone: [phone] Relation: Mother  Code Status:Full Code  Goals of care: Advanced Directive information    07/28/2022    2:14 PM  Advanced Directives  Does Patient Have a Medical Advance Directive? No  Would patient like information on creating a medical advance directive? No - Patient declined     Chief Complaint  Patient presents with   Follow-up    2 week follow-up on high blood pressure. Discuss need for shingrix and additional covid boosters or post pone if patient refuses or is not a candidate. [facility] verified.     HPI:  Pt is a 52 y.o. [name] seen today for an acute visit for 2 week follow-up on high blood pressure.He was here on 07/15/2022  his B/p was 160/100 with home B/p readings 130's -180's.He had been out of his medication.His meds were refilled and advised to follow up today to recheck B/p. On Spironolactone,Entersto,furosemide and Carvedilol. He brought in his B/p log readings ranges in the 120's /80's - 140's/80 with few readings elevated after exercise.   States usually has chest pain one or twice per week.pain usually rates at least a 8 /10 on scale.chest pain described as " tight Heart burn". States not on Nitroglycerin. No chest pain recently.  Has had one shingles vaccine.Due for second sinigrin.Also  covid boosters.  Past Medical History:  Diagnosis Date   Ascending Aortic Dilation    a. 09/2021 Echo: Asc Ao 48m.   CAD (coronary artery disease)    a. 07/2021 Cath: LM nl, LAD 662mD2 40, LCX 65p/m, OM1 90, RCA 1001m L-.R collats to RPDA from septal 1/2-->Med rx.   Chronic HFrEF (heart failure with reduced ejection  fraction) [place]  a. 07/2021 Echo: EF 30-35%, glob HK, mod LVH, GrII DD, nl RV fxn, mildly dil LA, mild-mod MR; b. 09/2021 Echo: EF 35-40%, glob HK, mod LVH, GrI DD, nl RV fxn, mildly dil RA, mild MR, Asc Ao 11m85m CKD (chronic kidney disease) stage 2, GFR 60-89 ml/min    Crohn's disease (HCC)[place] Hypertension    Mixed Ischemic & Nonischemic cardiomyopathy    a. 07/2021 Echo: EF 30-35%; b. 07/2021 Cath: Occluded RCA w/ mod LAD/LCX dzs, and severe OM1 dzs-->Med Rx; c. 09/2021 Echo: EF 35-40%.   PAH (pulmonary artery hypertension) (HCC)[place] a. 07/2021 RHC: PA 67/30 (42).   Proteinuria    Past Surgical History:  Procedure Laterality Date   COLECTOMY     RIGHT/LEFT HEART CATH AND CORONARY ANGIOGRAPHY N/A 08/07/2021   Procedure: RIGHT/LEFT HEART CATH AND CORONARY ANGIOGRAPHY;  Surgeon: End,Nelva Bush;  [facility];  Service: Cardiovascular;  Laterality: N/A;    Allergies  Allergen Reactions   Ibuprofen Hives and Other (See Comments)    Other reaction(s): GI Upset (intolerance) Hives Tolerates Advil  Other reaction(s): Not available   Spironolactone     Hyperkalemia   Sulfacetamide Other (See Comments)   Neomycin-Bacitracin Zn-Polymyx     Other reaction(s): Other (See Comments)    Outpatient Encounter Medications as of 07/29/2022  Medication Sig   acetaminophen (TYLENOL) 325 MG tablet Take 650 mg by mouth at bedtime.  aspirin EC 81 MG tablet Take 1 tablet (81 mg total) by mouth daily. Swallow whole.   atorvastatin (LIPITOR) 80 MG tablet Take 1 tablet (80 mg total) by mouth every evening.   carvedilol (COREG) 12.5 MG tablet Take 1&1/2 tablets (18.75 mg total) by mouth 2 (two) times daily with a meal.   clopidogrel (PLAVIX) 75 MG tablet Take 1 tablet (75 mg total) by mouth daily.   empagliflozin (JARDIANCE) 10 MG TABS tablet Take 10 mg by mouth daily.   ezetimibe (ZETIA) 10 MG tablet Take 1 tablet (10 mg total) by mouth daily.   famotidine (PEPCID) 10 MG tablet  Take 10 mg by mouth at bedtime. As needed   fenofibrate (TRICOR) 48 MG tablet Take 1 tablet (48 mg total) by mouth daily.   furosemide (LASIX) 20 MG tablet Take 20 mg by mouth daily as needed. For weight gain of 3 lb in 24 hours or 5 lbs in one week   sacubitril-valsartan (ENTRESTO) 97-103 MG Take 1 tablet by mouth 2 (two) times daily.   spironolactone (ALDACTONE) 25 MG tablet Take 1 tablet (25 mg total) by mouth daily.   No facility-administered encounter medications on file as of 07/29/2022.    Review of Systems  Constitutional:  Negative for appetite change, chills, fatigue, fever and unexpected weight change.  HENT:  Negative for congestion, dental problem, ear discharge, ear pain, facial swelling, hearing loss, nosebleeds, postnasal drip, rhinorrhea, sinus pressure, sinus pain, sneezing, sore throat, tinnitus and trouble swallowing.   Eyes:  Negative for pain, discharge, redness, itching and visual disturbance.       Left eye chronic clear drainage   Respiratory:  Negative for cough, chest tightness, shortness of breath and wheezing.   Cardiovascular:  Negative for chest pain, palpitations and leg swelling.  Gastrointestinal:  Negative for abdominal distention, abdominal pain, blood in stool, constipation, diarrhea, nausea and vomiting.  Skin:  Negative for color change, pallor and rash.  Neurological:  Negative for dizziness, syncope, speech difficulty, weakness, light-headedness, numbness and headaches.  Hematological:  Does not bruise/bleed easily.    Immunization History  Administered Date(s) Administered   Moderna SARS-COV2 Booster Vaccination 04/21/2020, 01/07/2021   Tdap 05/18/2013   Zoster Recombinat (Shingrix) 01/07/2021   Pertinent  Health Maintenance Due  Topic Date Due   INFLUENZA VACCINE  08/16/2022 (Originally 12/16/2021)      10/23/2021    8:00 AM 10/29/2021   10:41 AM 01/11/2022   12:56 AM 07/15/2022    8:39 AM 07/28/2022    2:14 PM  Fall Risk  Falls in the past  year?  0  0 0  Was there an injury with Fall?  0  0 0  Fall Risk Category Calculator  0  0 0  Fall Risk Category (Retired)  Low     (RETIRED) Patient Fall Risk Level Low fall risk Low fall risk Low fall risk    Patient at Risk for Falls Due to    No Fall Risks   Fall risk Follow up  Falls evaluation completed  Falls evaluation completed    Functional Status Survey:    Vitals:   07/29/22 1302 07/29/22 1309 07/29/22 1310  BP: 98/60 (!) 80/54 138/70  Pulse: 68    Resp: 16    Temp: (!) 97.5 F (36.4 C)    TempSrc: Temporal    SpO2: 96%    Weight: 226 lb 6.4 oz (102.7 kg)    Height: '5\' 10"'$  (1.778 m)     Body  mass index is 32.49 kg/m. Physical Exam Vitals reviewed.  Constitutional:      General: He is not in acute distress.    Appearance: Normal appearance. He is obese. He is not ill-appearing or diaphoretic.  HENT:     Head: Normocephalic.     Right Ear: Tympanic membrane, ear canal and external ear normal. There is no impacted cerumen.     Left Ear: Tympanic membrane, ear canal and external ear normal. There is no impacted cerumen.     Nose: Nose normal. No congestion or rhinorrhea.     Mouth/Throat:     Mouth: Mucous membranes are moist.     Pharynx: Oropharynx is clear. No oropharyngeal exudate or posterior oropharyngeal erythema.  Eyes:     General: No scleral icterus.       Right eye: No discharge.        Left eye: No discharge.     Extraocular Movements: Extraocular movements intact.     Conjunctiva/sclera: Conjunctivae normal.     Pupils: Pupils are equal, round, and reactive to light.  Neck:     Vascular: No carotid bruit.  Cardiovascular:     Rate and Rhythm: Normal rate and regular rhythm.     Pulses: Normal pulses.     Heart sounds: Normal heart sounds. No murmur heard.    No friction rub. No gallop.  Pulmonary:     Effort: Pulmonary effort is normal. No respiratory distress.     Breath sounds: Normal breath sounds. No wheezing, rhonchi or rales.  Chest:      Chest wall: No tenderness.  Abdominal:     General: Bowel sounds are normal. There is no distension.     Palpations: Abdomen is soft. There is no mass.     Tenderness: There is no abdominal tenderness. There is no right CVA tenderness, left CVA tenderness, guarding or rebound.     Comments: Right colostomy bag   Musculoskeletal:        General: No swelling or tenderness. Normal range of motion.     Cervical back: Normal range of motion. No rigidity or tenderness.     Right lower leg: No edema.     Left lower leg: No edema.  Lymphadenopathy:     Cervical: No cervical adenopathy.  Skin:    General: Skin is warm and dry.     Coloration: Skin is not pale.     Findings: No bruising, erythema, lesion or rash.  Neurological:     Mental Status: He is alert and oriented to person, place, and time.     Cranial Nerves: No cranial nerve deficit.     Sensory: No sensory deficit.     Motor: No weakness.     Coordination: Coordination normal.     Gait: Gait normal.  Psychiatric:        Mood and Affect: Mood normal.        Speech: Speech normal.        Behavior: Behavior normal.        Thought Content: Thought content normal.        Judgment: Judgment normal.     Labs reviewed: Recent Labs    09/10/21 0444 09/11/21 0602 09/14/21 2104 10/11/21 0510 10/14/21 1057 10/20/21 2317 10/21/21 0850 06/17/22 1412 06/26/22 1502 07/15/22 1425  NA 139  --    < > 140   < >  --    < > 139 138 143  K 4.2  --    < >  4.7   < >  --    < > 3.7 4.1 4.7  CL 105  --    < > 108   < >  --    < > 103 103 107  CO2 25  --    < > 26   < >  --    < > '27 29 25  '$ GLUCOSE 97  --    < > 100*   < >  --    < > 134* 109* 103*  BUN 29*  --    < > 28*   < >  --    < > '19 20 17  '$ CREATININE 1.43* 1.32*   < > 1.38*   < >  --    < > 1.36* 1.45* 1.37*  CALCIUM 9.5  --    < > 9.5   < >  --    < > 9.4 9.2 9.9  MG 2.0 2.0  --   --   --  2.0  --   --   --   --   PHOS  --   --   --  4.0  --   --   --   --   --   --    < >  = values in this interval not displayed.   Recent Labs    09/09/21 0329 09/14/21 2104 10/11/21 0510 01/27/22 1400 07/15/22 1425  AST 41 Clarence*  --  25 26  ALT 53* 59*  --  35 42  ALKPHOS 112 86  --  87  --   BILITOT 0.6 1.0  --  1.3* 0.6  PROT 7.6 6.6  --  6.7 7.0  ALBUMIN 3.8 3.6 3.7 3.6  --    Recent Labs    09/14/21 2104 09/15/21 0326 10/14/21 1057 10/20/21 1943 10/21/21 0545 12/09/21 1435 07/15/22 1425  WBC 7.1   < > 6.0   < > 8.0 8.3 7.3  NEUTROABS 5.1  --  3.8  --   --   --  4,636  HGB 14.3   < > 13.2   < > 12.0* 15.5 15.5  HCT 42.7   < > 39.3   < > 34.9* 46.2 46.1  MCV 85.2   < > 83.8   < > 82.1 85.4 83.1  PLT 189   < > 176   < > 159 179 237   < > = values in this interval not displayed.   Lab Results  Component Value Date   TSH 0.86 07/15/2022   Lab Results  Component Value Date   HGBA1C 5.4 08/05/2021   Lab Results  Component Value Date   CHOL 147 07/15/2022   HDL 39 (L) 07/15/2022   LDLCALC 81 07/15/2022   LDLDIRECT 69.0 09/09/2021   TRIG 177 (H) 07/15/2022   CHOLHDL 3.8 07/15/2022    Significant Diagnostic Results in last 30 days:  No results found.  Assessment/Plan 1. Essential hypertension B/p has improved since he restarted his medication after he run out. -  Advised to check Blood pressure at home and record on log and notify provider if B/p > 140/90  - continue on spironolactone, follow-up Entresto, furosemide and carvedilol  2. Coronary artery disease involving native coronary artery of native heart, unspecified whether angina present Chronic intermittent chest pain.No changes in intensity or radiation -Nitroglycerin as needed -Appointment with cardiology. - nitroGLYCERIN (NITROSTAT) 0.4 MG SL tablet; Place 1 tablet (0.4 mg total) under  the tongue every 5 (five) minutes as needed for chest pain.  Dispense: 25 tablet; Refill: 3  Family/ staff Communication: Reviewed plan of care with patient verbalized understanding  Labs/tests ordered:  None   Next Appointment: Return in about 6 months (around 01/29/2023), or if symptoms worsen or fail to improve.   Sandrea Hughs, NP

## 2022-07-30 VITALS — Ht 69.75 in | Wt 225.6 lb

## 2022-07-30 DIAGNOSIS — I5042 Chronic combined systolic (congestive) and diastolic (congestive) heart failure: Secondary | ICD-10-CM | POA: Diagnosis not present

## 2022-07-30 DIAGNOSIS — Z5189 Encounter for other specified aftercare: Secondary | ICD-10-CM | POA: Diagnosis not present

## 2022-07-30 NOTE — Patient Instructions (Signed)
Patient Instructions  Patient Details  Name: Clarence [name] MRN: [id number] Date of Birth: 09-04-1970 Referring Provider:  Nelva Bush, MD  Below are your personal goals for exercise, nutrition, and risk factors. Our goal is to help you stay on track towards obtaining and maintaining these goals. We will be discussing your progress on these goals with you throughout the program.  Initial Exercise Prescription:  Initial Exercise Prescription - 07/30/22 1400       Date of Initial Exercise RX and Referring Provider   Date 07/30/22    Referring Provider Dr. Nelva Bush, MD      Oxygen   Maintain Oxygen Saturation 88% or higher      Treadmill   MPH 1.8    Grade 0.5    Minutes 15    METs 2.5      Recumbant Bike   Level 1    RPM 50    Watts 25    Minutes 15    METs 2.78      NuStep   Level 2    SPM 80    Minutes 15    METs 2.78      REL-XR   Level 2    Speed 50    Minutes 15    METs 2.78      Prescription Details   Frequency (times per week) 3    Duration Progress to 30 minutes of continuous aerobic without signs/symptoms of physical distress      Intensity   THRR 40-80% of Max Heartrate 103-145    Ratings of Perceived Exertion 11-13    Perceived Dyspnea 0-4      Progression   Progression Continue to progress workloads to maintain intensity without signs/symptoms of physical distress.      Resistance Training   Training Prescription Yes    Weight 4 lb    Reps 10-15             Exercise Goals: Frequency: Be able to perform aerobic exercise two to three times per week in program working toward 2-5 days per week of home exercise.  Intensity: Work with a perceived exertion of 11 (fairly light) - 15 (hard) while following your exercise prescription.  We will make changes to your prescription with you as you progress through the program.   Duration: Be able to do 30 to 45 minutes of continuous aerobic exercise in addition to a 5 minute warm-up and  a 5 minute cool-down routine.   Nutrition Goals: Your personal nutrition goals will be established when you do your nutrition analysis with the dietician.  The following are general nutrition guidelines to follow: Cholesterol < '200mg'$ /day Sodium < '1500mg'$ /day Fiber: Men over 50 yrs - 30 grams per day  Personal Goals:  Personal Goals and Risk Factors at Admission - 07/30/22 1110       Core Components/Risk Factors/Patient Goals on Admission    Weight Management Yes;Weight Loss    Intervention Weight Management: Develop a combined nutrition and exercise program designed to reach desired caloric intake, while maintaining appropriate intake of nutrient and fiber, sodium and fats, and appropriate energy expenditure required for the weight goal.;Weight Management: Provide education and appropriate resources to help participant work on and attain dietary goals.;Weight Management/Obesity: Establish reasonable short term and long term weight goals.    Admit Weight 225 lb 9.6 oz (102.3 kg)    Goal Weight: Short Term 215 lb (97.5 kg)    Goal Weight: Long Term 195 lb (88.5 kg)  Expected Outcomes Weight Loss: Understanding of general recommendations for a balanced deficit meal plan, which promotes 1-2 lb weight loss per week and includes a negative energy balance of [phone] kcal/d;Short Term: Continue to assess and modify interventions until short term weight is achieved;Long Term: Adherence to nutrition and physical activity/exercise program aimed toward attainment of established weight goal;Understanding recommendations for meals to include 15-35% energy as protein, 25-35% energy from fat, 35-60% energy from carbohydrates, less than '200mg'$  of dietary cholesterol, 20-35 gm of total fiber daily;Understanding of distribution of calorie intake throughout the day with the consumption of 4-5 meals/snacks    Heart Failure Yes    Intervention Provide a combined exercise and nutrition program that is supplemented  with education, support and counseling about heart failure. Directed toward relieving symptoms such as shortness of breath, decreased exercise tolerance, and extremity edema.    Expected Outcomes Improve functional capacity of life;Short term: Attendance in program 2-3 days a week with increased exercise capacity. Reported lower sodium intake. Reported increased fruit and vegetable intake. Reports medication compliance.;Long term: Adoption of self-care skills and reduction of barriers for early signs and symptoms recognition and intervention leading to self-care maintenance.;Short term: Daily weights obtained and reported for increase. Utilizing diuretic protocols set by physician.    Hypertension Yes    Intervention Provide education on lifestyle modifcations including regular physical activity/exercise, weight management, moderate sodium restriction and increased consumption of fresh fruit, vegetables, and low fat dairy, alcohol moderation, and smoking cessation.;Monitor prescription use compliance.    Expected Outcomes Long Term: Maintenance of blood pressure at goal levels.;Short Term: Continued assessment and intervention until BP is < 140/75m HG in hypertensive participants. < 130/838mHG in hypertensive participants with diabetes, heart failure or chronic kidney disease.    Lipids Yes    Intervention Provide education and support for participant on nutrition & aerobic/resistive exercise along with prescribed medications to achieve LDL '70mg'$ , HDL >'40mg'$ .    Expected Outcomes Short Term: Participant states understanding of desired cholesterol values and is compliant with medications prescribed. Participant is following exercise prescription and nutrition guidelines.;Long Term: Cholesterol controlled with medications as prescribed, with individualized exercise RX and with personalized nutrition plan. Value goals: LDL < '70mg'$ , HDL > 40 mg.            Exercise Goals and Review:  Exercise Goals      Row Name 07/30/22 1112             Exercise Goals   Increase Physical Activity Yes       Intervention Provide advice, education, support and counseling about physical activity/exercise needs.;Develop an individualized exercise prescription for aerobic and resistive training based on initial evaluation findings, risk stratification, comorbidities and participant's personal goals.       Expected Outcomes Short Term: Attend rehab on a regular basis to increase amount of physical activity.;Long Term: Add in home exercise to make exercise part of routine and to increase amount of physical activity.;Long Term: Exercising regularly at least 3-5 days a week.       Increase Strength and Stamina Yes       Intervention Provide advice, education, support and counseling about physical activity/exercise needs.;Develop an individualized exercise prescription for aerobic and resistive training based on initial evaluation findings, risk stratification, comorbidities and participant's personal goals.       Expected Outcomes Short Term: Increase workloads from initial exercise prescription for resistance, speed, and METs.;Long Term: Improve cardiorespiratory fitness, muscular endurance and strength as measured by increased METs  and functional capacity (6MWT);Short Term: Perform resistance training exercises routinely during rehab and add in resistance training at home       Able to understand and use rate of perceived exertion (RPE) scale Yes       Intervention Provide education and explanation on how to use RPE scale       Expected Outcomes Short Term: Able to use RPE daily in rehab to express subjective intensity level;Long Term:  Able to use RPE to guide intensity level when exercising independently       Able to understand and use Dyspnea scale Yes       Intervention Provide education and explanation on how to use Dyspnea scale       Expected Outcomes Short Term: Able to use Dyspnea scale daily in rehab to  express subjective sense of shortness of breath during exertion;Long Term: Able to use Dyspnea scale to guide intensity level when exercising independently       Knowledge and understanding of Target Heart Rate Range (THRR) Yes       Intervention Provide education and explanation of THRR including how the numbers were predicted and where they are located for reference       Expected Outcomes Short Term: Able to state/look up THRR;Long Term: Able to use THRR to govern intensity when exercising independently;Short Term: Able to use daily as guideline for intensity in rehab       Able to check pulse independently Yes       Intervention Provide education and demonstration on how to check pulse in carotid and radial arteries.;Review the importance of being able to check your own pulse for safety during independent exercise       Expected Outcomes Short Term: Able to explain why pulse checking is important during independent exercise;Long Term: Able to check pulse independently and accurately       Understanding of Exercise Prescription Yes       Intervention Provide education, explanation, and written materials on patient's individual exercise prescription       Expected Outcomes Short Term: Able to explain program exercise prescription;Long Term: Able to explain home exercise prescription to exercise independently

## 2022-07-30 NOTE — Progress Notes (Signed)
Pulmonary Individual Treatment Plan  Patient Details  Name: Clarence [name] MRN: [id number] Date of Birth: January 17, 2001 Referring Provider:   Flowsheet Row Pulmonary Rehab from 07/30/2022 in [facility] Cardiac and Pulmonary Rehab  Referring Provider Dr. Nelva Bush, MD       Initial Encounter Date:  Flowsheet Row Pulmonary Rehab from 07/30/2022 in [facility] Cardiac and Pulmonary Rehab  Date 07/30/22       Visit Diagnosis: Chronic combined systolic and diastolic congestive heart failure ([place])  Patient's Home Medications on Admission:  Current Outpatient Medications:    acetaminophen (TYLENOL) 325 MG tablet, Take 650 mg by mouth at bedtime., Disp: , Rfl:    aspirin EC 81 MG tablet, Take 1 tablet (81 mg total) by mouth daily. Swallow whole., Disp: 30 tablet, Rfl: 4   atorvastatin (LIPITOR) 80 MG tablet, Take 1 tablet (80 mg total) by mouth every evening., Disp: 30 tablet, Rfl: 4   carvedilol (COREG) 12.5 MG tablet, Take 1&1/2 tablets (18.75 mg total) by mouth 2 (two) times daily with a meal., Disp: 90 tablet, Rfl: 3   clopidogrel (PLAVIX) 75 MG tablet, Take 1 tablet (75 mg total) by mouth daily., Disp: 30 tablet, Rfl: 6   empagliflozin (JARDIANCE) 10 MG TABS tablet, Take 10 mg by mouth daily., Disp: , Rfl:    ezetimibe (ZETIA) 10 MG tablet, Take 1 tablet (10 mg total) by mouth daily., Disp: 90 tablet, Rfl: 3   famotidine (PEPCID) 10 MG tablet, Take 10 mg by mouth at bedtime. As needed, Disp: , Rfl:    fenofibrate (TRICOR) 48 MG tablet, Take 1 tablet (48 mg total) by mouth daily., Disp: 30 tablet, Rfl: 8   furosemide (LASIX) 20 MG tablet, Take 20 mg by mouth daily as needed. For weight gain of 3 lb in 24 hours or 5 lbs in one week, Disp: , Rfl:    nitroGLYCERIN (NITROSTAT) 0.4 MG SL tablet, Place 1 tablet (0.4 mg total) under the tongue every 5 (five) minutes as needed for chest pain., Disp: 25 tablet, Rfl: 3   sacubitril-valsartan (ENTRESTO) 97-103 MG, Take 1 tablet by mouth 2 (two) times  daily., Disp: 180 tablet, Rfl: 3   spironolactone (ALDACTONE) 25 MG tablet, Take 1 tablet (25 mg total) by mouth daily., Disp: 90 tablet, Rfl: 1   Zoster Vaccine Adjuvanted [facility]) injection, Inject 0.5 mLs into the muscle once for 1 dose., Disp: 0.5 mL, Rfl: 0  Past Medical History: Past Medical History:  Diagnosis Date   Ascending Aortic Dilation    a. 09/2021 Echo: Asc Ao 14m.   CAD (coronary artery disease)    a. 07/2021 Cath: LM nl, LAD 648mD2 40, LCX 65p/m, OM1 90, RCA 10080m L-.R collats to RPDA from septal 1/2-->Med rx.   Chronic HFrEF (heart [place] with reduced [place] fraction) [place]  a. 07/2021 Echo: EF 30-35%, glob HK, mod LVH, GrII DD, nl RV fxn, mildly dil LA, mild-mod MR; b. 09/2021 Echo: EF 35-40%, glob HK, mod LVH, GrI DD, nl RV fxn, mildly dil RA, mild MR, Asc Ao 22m48m CKD (chronic kidney disease) stage 2, GFR 60-89 ml/min    Crohn's disease (HCC)[place] Hypertension    Mixed Ischemic & Nonischemic cardiomyopathy    a. 07/2021 Echo: EF 30-35%; b. 07/2021 Cath: Occluded RCA w/ mod LAD/LCX dzs, and severe OM1 dzs-->Med Rx; c. 09/2021 Echo: EF 35-40%.   PAH (pulmonary artery hypertension) (HCC)[place] a. 07/2021 RHC: PA 67/30 (42).  Proteinuria     Tobacco Use: Social History   Tobacco Use  Smoking Status Former   Types: Cigars   Quit date: 08/13/2021   Years since quitting: 54  Smokeless Tobacco Not on file    Labs: Review Flowsheet  More data exists      Latest Ref Rng & Units 09/09/2021 09/10/2021 10/07/2021 01/27/2022 07/15/2022  Labs for ITP Cardiac and Pulmonary Rehab  Cholestrol <200 mg/dL 174  C 140  149  148  147   LDL (calc) mg/dL (calc) UNABLE TO CALCULATE IF TRIGLYCERIDE OVER 400 mg/dL  C 50  43  70  81   Direct LDL 0 - 99 mg/dL 69.0  - - - -  HDL-C > OR = 40 mg/dL 35  C 34  30  35  39   Trlycerides <150 mg/dL 520  C 281  382  214  177     Details      C Corrected result          Pulmonary Assessment Scores:  Pulmonary Assessment Scores      Row Name 07/28/22 1459 07/30/22 1109       ADL UCSD   ADL Phase Entry Entry    SOB Score total 54 54    Rest 5 5    Walk 3 3    Stairs 3 3    Bath 3 3    Dress 2 2    Shop 3 3      CAT Score   CAT Score 22 22      mMRC Score   mMRC Score -- 2             UCSD: Self-administered rating of dyspnea associated with activities of daily living (ADLs) 6-point scale (0 = "not at all" to 5 = "maximal or unable to do because of breathlessness")  Scoring Scores range from 0 to 120.  Minimally important difference is 5 units  CAT: CAT can identify the health impairment of COPD patients and is better correlated with disease progression.  CAT has a scoring range of zero to 40. The CAT score is classified into four groups of low (less than 10), medium (10 - 20), high (21-30) and very high (31-40) based on the impact level of disease on health status. A CAT score over 10 suggests significant symptoms.  A worsening CAT score could be explained by an exacerbation, poor medication adherence, poor inhaler technique, or progression of COPD or comorbid conditions.  CAT MCID is 2 points  mMRC: mMRC (Modified Medical Research Council) Dyspnea Scale is used to assess the degree of baseline functional disability in patients of respiratory disease due to dyspnea. No minimal important difference is established. A decrease in score of 1 point or greater is considered a positive change.   Pulmonary Function Assessment:   Exercise Target Goals: Exercise Program Goal: Individual exercise prescription set using results from initial 6 min walk test and THRR while considering  patient's activity barriers and safety.   Exercise Prescription Goal: Initial exercise prescription builds to 30-45 minutes a day of aerobic activity, 2-3 days per week.  Home exercise guidelines will be given to patient during program as part of exercise prescription that the participant will acknowledge.  Education: Aerobic  Exercise: - Group verbal and visual presentation on the components of exercise prescription. Introduces F.I.T.T principle from ACSM for exercise prescriptions.  Reviews F.I.T.T. principles of aerobic exercise including progression. Written material given at graduation. Flowsheet Row Pulmonary  Rehab from 07/30/2022 in [facility] Cardiac and Pulmonary Rehab  Education need identified 07/30/22       Education: Resistance Exercise: - Group verbal and visual presentation on the components of exercise prescription. Introduces F.I.T.T principle from ACSM for exercise prescriptions  Reviews F.I.T.T. principles of resistance exercise including progression. Written material given at graduation.    Education: Exercise & Equipment Safety: - Individual verbal instruction and demonstration of equipment use and safety with use of the equipment. Flowsheet Row Pulmonary Rehab from 07/30/2022 in [facility] Cardiac and Pulmonary Rehab  Date 07/30/22  Educator NT  Instruction Review Code 1- Verbalizes Understanding       Education: Exercise Physiology & General Exercise Guidelines: - Group verbal and written instruction with models to review the exercise physiology of the cardiovascular system and associated critical values. Provides general exercise guidelines with specific guidelines to those with heart or lung disease.    Education: Flexibility, Balance, Mind/Body Relaxation: - Group verbal and visual presentation with interactive activity on the components of exercise prescription. Introduces F.I.T.T principle from ACSM for exercise prescriptions. Reviews F.I.T.T. principles of flexibility and balance exercise training including progression. Also discusses the mind body connection.  Reviews various relaxation techniques to help reduce and manage stress (i.e. Deep breathing, progressive muscle relaxation, and visualization). Balance handout provided to take home. Written material given at graduation.   Activity  Barriers & Risk Stratification:  Activity Barriers & Cardiac Risk Stratification - 07/30/22 1111       Activity Barriers & Cardiac Risk Stratification   Activity Barriers Joint Problems;Deconditioning;Shortness of Breath   left shoulder pain            6 Minute Walk:  6 Minute Walk     Row Name 07/30/22 1343         6 Minute Walk   Phase Initial     Distance 940 feet     Walk Time 6 minutes     # of Rest Breaks 0     MPH 1.78     METS 2.78     RPE 13     Perceived Dyspnea  2     VO2 Peak 9.73     Symptoms Yes (comment)     Comments SOB     Resting HR 61 bpm     Resting BP 104/62     Resting Oxygen Saturation  94 %     Exercise Oxygen Saturation  during 6 min walk 94 %     Max Ex. HR 90 bpm     Max Ex. BP 124/72     2 Minute Post BP 100/64       Interval HR   1 Minute HR 83     2 Minute HR 88     3 Minute HR 86     4 Minute HR 86     5 Minute HR 90     6 Minute HR 84     2 Minute Post HR 61     Interval Heart Rate? Yes       Interval Oxygen   Interval Oxygen? Yes     Baseline Oxygen Saturation % 94 %     1 Minute Oxygen Saturation % 94 %     1 Minute Liters of Oxygen 0 L  RA     2 Minute Liters of Oxygen 0 L  RA     3 Minute Oxygen Saturation % 94 %     3 Minute Liters  of Oxygen 0 L  RA     4 Minute Oxygen Saturation % 94 %     4 Minute Liters of Oxygen 0 L  RA     5 Minute Oxygen Saturation % 94 %     5 Minute Liters of Oxygen 0 L  RA     6 Minute Oxygen Saturation % 94 %     6 Minute Liters of Oxygen 0 L  RA     2 Minute Post Oxygen Saturation % 96 %     2 Minute Post Liters of Oxygen 0 L  RA             Oxygen Initial Assessment:  Oxygen Initial Assessment - 07/28/22 1416       Home Oxygen   Home Oxygen Device None    Sleep Oxygen Prescription None    Home Exercise Oxygen Prescription None    Home Resting Oxygen Prescription None    Compliance with Home Oxygen Use Yes      Intervention   Short Term Goals To learn and understand  importance of maintaining oxygen saturations>88%;To learn and demonstrate proper pursed lip breathing techniques or other breathing techniques. ;To learn and understand importance of monitoring SPO2 with pulse oximeter and demonstrate accurate use of the pulse oximeter.    Long  Term Goals Maintenance of O2 saturations>88%;Compliance with respiratory medication;Verbalizes importance of monitoring SPO2 with pulse oximeter and return demonstration;Exhibits proper breathing techniques, such as pursed lip breathing or other method taught during program session             Oxygen Re-Evaluation:   Oxygen Discharge (Final Oxygen Re-Evaluation):   Initial Exercise Prescription:  Initial Exercise Prescription - 07/30/22 1400       Date of Initial Exercise RX and Referring Provider   Date 07/30/22    Referring Provider Dr. Nelva Bush, MD      Oxygen   Maintain Oxygen Saturation 88% or higher      Treadmill   MPH 1.8    Grade 0.5    Minutes 15    METs 2.5      Recumbant Bike   Level 1    RPM 50    Watts 25    Minutes 15    METs 2.78      NuStep   Level 2    SPM 80    Minutes 15    METs 2.78      REL-XR   Level 2    Speed 50    Minutes 15    METs 2.78      Prescription Details   Frequency (times per week) 3    Duration Progress to 30 minutes of continuous aerobic without signs/symptoms of physical distress      Intensity   THRR 40-80% of Max Heartrate 103-145    Ratings of Perceived Exertion 11-13    Perceived Dyspnea 0-4      Progression   Progression Continue to progress workloads to maintain intensity without signs/symptoms of physical distress.      Resistance Training   Training Prescription Yes    Weight 4 lb    Reps 10-15             Perform Capillary Blood Glucose checks as needed.  Exercise Prescription Changes:   Exercise Prescription Changes     Row Name 07/30/22 1400             Response to Exercise   Blood Pressure (  Admit)  104/62       Blood Pressure (Exercise) 124/72       Blood Pressure (Exit) 100/64       Heart Rate (Admit) 61 bpm       Heart Rate (Exercise) 90 bpm       Heart Rate (Exit) 61 bpm       Oxygen Saturation (Admit) 94 %       Oxygen Saturation (Exercise) 94 %       Oxygen Saturation (Exit) 96 %       Rating of Perceived Exertion (Exercise) 13       Perceived Dyspnea (Exercise) 2       Symptoms SOB       Comments 6MWT Results                Exercise Comments:   Exercise Goals and Review:   Exercise Goals     Row Name 07/30/22 1112             Exercise Goals   Increase Physical Activity Yes       Intervention Provide advice, education, support and counseling about physical activity/exercise needs.;Develop an individualized exercise prescription for aerobic and resistive training based on initial evaluation findings, risk stratification, comorbidities and participant's personal goals.       Expected Outcomes Short Term: Attend rehab on a regular basis to increase amount of physical activity.;Long Term: Add in home exercise to make exercise part of routine and to increase amount of physical activity.;Long Term: Exercising regularly at least 3-5 days a week.       Increase Strength and Stamina Yes       Intervention Provide advice, education, support and counseling about physical activity/exercise needs.;Develop an individualized exercise prescription for aerobic and resistive training based on initial evaluation findings, risk stratification, comorbidities and participant's personal goals.       Expected Outcomes Short Term: Increase workloads from initial exercise prescription for resistance, speed, and METs.;Long Term: Improve cardiorespiratory fitness, muscular endurance and strength as measured by increased METs and functional capacity (6MWT);Short Term: Perform resistance training exercises routinely during rehab and add in resistance training at home       Able to understand and  use rate of perceived exertion (RPE) scale Yes       Intervention Provide education and explanation on how to use RPE scale       Expected Outcomes Short Term: Able to use RPE daily in rehab to express subjective intensity level;Long Term:  Able to use RPE to guide intensity level when exercising independently       Able to understand and use Dyspnea scale Yes       Intervention Provide education and explanation on how to use Dyspnea scale       Expected Outcomes Short Term: Able to use Dyspnea scale daily in rehab to express subjective sense of shortness of breath during exertion;Long Term: Able to use Dyspnea scale to guide intensity level when exercising independently       Knowledge and understanding of Target Heart Rate Range (THRR) Yes       Intervention Provide education and explanation of THRR including how the numbers were predicted and where they are located for reference       Expected Outcomes Short Term: Able to state/look up THRR;Long Term: Able to use THRR to govern intensity when exercising independently;Short Term: Able to use daily as guideline for intensity in rehab  Able to check pulse independently Yes       Intervention Provide education and demonstration on how to check pulse in carotid and radial arteries.;Review the importance of being able to check your own pulse for safety during independent exercise       Expected Outcomes Short Term: Able to explain why pulse checking is important during independent exercise;Long Term: Able to check pulse independently and accurately       Understanding of Exercise Prescription Yes       Intervention Provide education, explanation, and written materials on patient's individual exercise prescription       Expected Outcomes Short Term: Able to explain program exercise prescription;Long Term: Able to explain home exercise prescription to exercise independently                Exercise Goals Re-Evaluation :   Discharge Exercise  Prescription (Final Exercise Prescription Changes):  Exercise Prescription Changes - 07/30/22 1400       Response to Exercise   Blood Pressure (Admit) 104/62    Blood Pressure (Exercise) 124/72    Blood Pressure (Exit) 100/64    Heart Rate (Admit) 61 bpm    Heart Rate (Exercise) 90 bpm    Heart Rate (Exit) 61 bpm    Oxygen Saturation (Admit) 94 %    Oxygen Saturation (Exercise) 94 %    Oxygen Saturation (Exit) 96 %    Rating of Perceived Exertion (Exercise) 13    Perceived Dyspnea (Exercise) 2    Symptoms SOB    Comments 6MWT Results             Nutrition:  Target Goals: Understanding of nutrition guidelines, daily intake of sodium '1500mg'$ , cholesterol '200mg'$ , calories 30% from fat and 7% or less from saturated fats, daily to have 5 or more servings of fruits and vegetables.  Education: All About Nutrition: -Group instruction provided by verbal, written material, interactive activities, discussions, models, and posters to present general guidelines for heart healthy nutrition including fat, fiber, MyPlate, the role of sodium in heart healthy nutrition, utilization of the nutrition label, and utilization of this knowledge for meal planning. Follow up email sent as well. Written material given at graduation. Flowsheet Row Pulmonary Rehab from 07/30/2022 in [facility] Cardiac and Pulmonary Rehab  Education need identified 07/30/22       Biometrics:  Pre Biometrics - 07/30/22 1112       Pre Biometrics   Height 5' 9.75" (1.772 m)    Weight 225 lb 9.6 oz (102.3 kg)    Waist Circumference 43 inches    Hip Circumference 44 inches    Waist to Hip Ratio 0.98 %    BMI (Calculated) 32.59    Single Leg Stand 30 seconds              Nutrition Therapy Plan and Nutrition Goals:  Nutrition Therapy & Goals - 07/30/22 1038       Nutrition Therapy   Diet Heart healthy, low Na    Drug/Food Interactions Statins/Certain Fruits    Protein (specify units) 80-85g    Fiber 35 grams     Whole Grain Foods 3 servings    Saturated Fats 16 max. grams    Fruits and Vegetables 8 servings/day    Sodium 2 grams      Personal Nutrition Goals   Nutrition Goal ST: practice MyPlate guidelines, practice reading food labels  LT: limit Na <2g/day, limit saturated fat <16g/day, aim for 30-35g of fiber per day  Comments 52 y.o. M admitted to pulmonary rehab for Chronic combined systolic and diastolic congestive heart failure. PMHx includes CAD, CKD stg 2, crohn's disease, HTN, pulmonary artery hypertension. Reviewed relevant medications atorvastatin, jardiance, zetia, pepcid, furosemide. Reviewed most recent labs. Chesley reports that his Crohns has been well controlled and his main trigger foods are certain types of processed meats; eating smaller/more frequent meals help to manage his symptoms as well. Emidio reports limiting chinese food, pizza, and fried foods. He has been trying to use a grill for his cooking to avoid lots of added fat or grease. He has been trying to cut back on soda and now only has it occassionally. Discussed food patterns and heart healthy eating as well as MyPlate. Encouagred him to read labels, choose liquid cooking oils, and to include protein, healthy fat, and fiber at meals to make them more nutrient dense and satiating. Discussed CKD stg 2 MNT and how a modest protei is recommeneded (0.8 g/kg/d) - encouraged to be mindful of animal protein portions and to include plant protein.      Intervention Plan   Intervention Prescribe, educate and counsel regarding individualized specific dietary modifications aiming towards targeted core components such as weight, hypertension, lipid management, diabetes, heart failure and other comorbidities.;Nutrition handout(s) given to patient.    Expected Outcomes Short Term Goal: Understand basic principles of dietary content, such as calories, fat, sodium, cholesterol and nutrients.;Short Term Goal: A plan has been developed with personal  nutrition goals set during dietitian appointment.;Long Term Goal: Adherence to prescribed nutrition plan.             Nutrition Assessments:  MEDIFICTS Score Key: ?70 Need to make dietary changes  40-70 Heart Healthy Diet ? 40 Therapeutic Level Cholesterol Diet  Flowsheet Row Pulmonary Rehab from 07/28/2022 in [facility] Cardiac and Pulmonary Rehab  Picture Your Plate Total Score on Admission 51      Picture Your Plate Scores: [id number] Unhealthy dietary pattern with much room for improvement. 41-50 Dietary pattern unlikely to meet recommendations for good health and room for improvement. 51-60 More healthful dietary pattern, with some room for improvement.  >60 Healthy dietary pattern, although there may be some specific behaviors that could be improved.   Nutrition Goals Re-Evaluation:   Nutrition Goals Discharge (Final Nutrition Goals Re-Evaluation):   Psychosocial: Target Goals: Acknowledge presence or absence of significant depression and/or stress, maximize coping skills, provide positive support system. Participant is able to verbalize types and ability to use techniques and skills needed for reducing stress and depression.   Education: Stress, Anxiety, and Depression - Group verbal and visual presentation to define topics covered.  Reviews how body is impacted by stress, anxiety, and depression.  Also discusses healthy ways to reduce stress and to treat/manage anxiety and depression.  Written material given at graduation.   Education: Sleep Hygiene -Provides group verbal and written instruction about how sleep can affect your health.  Define sleep hygiene, discuss sleep cycles and impact of sleep habits. Review good sleep hygiene tips.    Initial Review & Psychosocial Screening:  Initial Psych Review & Screening - 07/28/22 1349       Initial Review   Current issues with Current Stress Concerns    Source of Stress Concerns Chronic Illness      Family Dynamics   Good  Support System? Yes   mother     Barriers   Psychosocial barriers to participate in program There are no identifiable barriers or psychosocial needs.;The patient should  benefit from training in stress management and relaxation.      Screening Interventions   Interventions To provide support and resources with identified psychosocial needs;Encouraged to exercise;Provide feedback about the scores to participant    Expected Outcomes Short Term goal: Utilizing psychosocial counselor, staff and physician to assist with identification of specific Stressors or current issues interfering with healing process. Setting desired goal for each stressor or current issue identified.;Long Term Goal: Stressors or current issues are controlled or eliminated.;Short Term goal: Identification and review with participant of any Quality of Life or Depression concerns found by scoring the questionnaire.;Long Term goal: The participant improves quality of Life and PHQ9 Scores as seen by post scores and/or verbalization of changes             Quality of Life Scores:  Scores of 19 and below usually indicate a poorer quality of life in these areas.  A difference of  2-3 points is a clinically meaningful difference.  A difference of 2-3 points in the total score of the Quality of Life Index has been associated with significant improvement in overall quality of life, self-image, physical symptoms, and general health in studies assessing change in quality of life.  PHQ-9: Review Flowsheet       07/15/2022 10/29/2021 10/14/2021  Depression screen PHQ 2/9  Decreased Interest 0 0 0  Down, Depressed, Hopeless 0 0 0  PHQ - 2 Score 0 0 0  Altered sleeping 0 - -  Tired, decreased energy 0 - -  Change in appetite 0 - -  Feeling bad or failure about yourself  0 - -  Trouble concentrating 0 - -  Moving slowly or fidgety/restless 0 - -  Suicidal thoughts 0 - -  PHQ-9 Score 0 - -  Difficult doing work/chores Not difficult at  all - -   Interpretation of Total Score  Total Score Depression Severity:  1-4 = Minimal depression, 5-9 = Mild depression, 10-14 = Moderate depression, 15-19 = Moderately severe depression, 20-27 = Severe depression   Psychosocial Evaluation and Intervention:  Psychosocial Evaluation - 07/28/22 1418       Psychosocial Evaluation & Interventions   Interventions Stress management education;Relaxation education;Encouraged to exercise with the program and follow exercise prescription    Comments Quade is coming to Pulmonary Rehab for combined heart failure. His most recent EF is 35-40%. He reports having two heart attacks in the past. 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 He is living with his mother since his heart attack. He is hesitant to exercise or get back to his normal routine because he is scared about his heart and doesn't want anything to happen to him for his mother's sake. He mentioned that if he goes out, he always makes sure he is close to a hospital in case something happens. He enjoys talking about his job, but doesn't know if he will be able to do all that is required physically. 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 He wants to come to the program to work on his strength and stamina. He also wants more education on what is going on with his heart and his prognosis.    Expected Outcomes Short: attend pulmonary rehab for education and exercise. Long: develop and maintain positive self care habits.    Continue Psychosocial Services  Follow up required by staff  Psychosocial Re-Evaluation:   Psychosocial Discharge (Final Psychosocial Re-Evaluation):   Education: Education Goals: Education classes will be provided on a weekly basis, covering required topics. Participant will state understanding/return demonstration of topics  presented.  Learning Barriers/Preferences:  Learning Barriers/Preferences - 07/28/22 1335       Learning Barriers/Preferences   Learning Barriers None    Learning Preferences None             General Pulmonary Education Topics:  Infection Prevention: - Provides verbal and written material to individual with discussion of infection control including proper hand washing and proper equipment cleaning during exercise session. Flowsheet Row Pulmonary Rehab from 07/30/2022 in [facility] Cardiac and Pulmonary Rehab  Date 07/30/22  Educator NT  Instruction Review Code 1- Verbalizes Understanding       Falls Prevention: - Provides verbal and written material to individual with discussion of falls prevention and safety. Flowsheet Row Pulmonary Rehab from 07/30/2022 in [facility] Cardiac and Pulmonary Rehab  Date 07/30/22  Educator NT  Instruction Review Code 1- Verbalizes Understanding       Chronic Lung Disease Review: - Group verbal instruction with posters, models, PowerPoint presentations and videos,  to review new updates, new respiratory medications, new advancements in procedures and treatments. Providing information on websites and "800" numbers for continued self-education. Includes information about supplement oxygen, available portable oxygen systems, continuous and intermittent flow rates, oxygen safety, concentrators, and Medicare reimbursement for oxygen. Explanation of Pulmonary Drugs, including class, frequency, complications, importance of spacers, rinsing mouth after steroid MDI's, and proper cleaning methods for nebulizers. Review of basic lung anatomy and physiology related to function, structure, and complications of lung disease. Review of risk factors. Discussion about methods for diagnosing sleep apnea and types of masks and machines for OSA. Includes a review of the use of types of environmental controls: home humidity, furnaces, filters, dust mite/pet prevention, HEPA  vacuums. Discussion about weather changes, air quality and the benefits of nasal washing. Instruction on Warning signs, infection symptoms, calling MD promptly, preventive modes, and value of vaccinations. Review of effective airway clearance, coughing and/or vibration techniques. Emphasizing that all should Create an Action Plan. Written material given at graduation.   AED/CPR: - Group verbal and written instruction with the use of models to demonstrate the basic use of the AED with the basic ABC's of resuscitation.    Anatomy and Cardiac Procedures: - Group verbal and visual presentation and models provide information about basic cardiac anatomy and function. Reviews the testing methods done to diagnose heart disease and the outcomes of the test results. Describes the treatment choices: Medical Management, Angioplasty, or Coronary Bypass Surgery for treating various heart conditions including Myocardial Infarction, Angina, Valve Disease, and Cardiac Arrhythmias.  Written material given at graduation. Flowsheet Row Pulmonary Rehab from 07/30/2022 in [facility] Cardiac and Pulmonary Rehab  Education need identified 07/30/22       Medication Safety: - Group verbal and visual instruction to review commonly prescribed medications for heart and lung disease. Reviews the medication, class of the drug, and side effects. Includes the steps to properly store meds and maintain the prescription regimen.  Written material given at graduation. Flowsheet Row Pulmonary Rehab from 07/30/2022 in [facility] Cardiac and Pulmonary Rehab  Education need identified 07/30/22       Other: -Provides group and verbal instruction on various topics (see comments)   Knowledge Questionnaire Score:  Knowledge Questionnaire Score - 07/28/22 1458       Knowledge Questionnaire Score   Pre Score 15/26  Core Components/Risk Factors/Patient Goals at Admission:  Personal Goals and Risk Factors at Admission -  07/30/22 1110       Core Components/Risk Factors/Patient Goals on Admission    Weight Management Yes;Weight Loss    Intervention Weight Management: Develop a combined nutrition and exercise program designed to reach desired caloric intake, while maintaining appropriate intake of nutrient and fiber, sodium and fats, and appropriate energy expenditure required for the weight goal.;Weight Management: Provide education and appropriate resources to help participant work on and attain dietary goals.;Weight Management/Obesity: Establish reasonable short term and long term weight goals.    Admit Weight 225 lb 9.6 oz (102.3 kg)    Goal Weight: Short Term 215 lb (97.5 kg)    Goal Weight: Long Term 195 lb (88.5 kg)    Expected Outcomes Weight Loss: Understanding of general recommendations for a balanced deficit meal plan, which promotes 1-2 lb weight loss per week and includes a negative energy balance of [phone] kcal/d;Short Term: Continue to assess and modify interventions until short term weight is achieved;Long Term: Adherence to nutrition and physical activity/exercise program aimed toward attainment of established weight goal;Understanding recommendations for meals to include 15-35% energy as protein, 25-35% energy from fat, 35-60% energy from carbohydrates, less than '200mg'$  of dietary cholesterol, 20-35 gm of total fiber daily;Understanding of distribution of calorie intake throughout the day with the consumption of 4-5 meals/snacks    Heart Failure Yes    Intervention Provide a combined exercise and nutrition program that is supplemented with education, support and counseling about heart failure. Directed toward relieving symptoms such as shortness of breath, decreased exercise tolerance, and extremity edema.    Expected Outcomes Improve functional capacity of life;Short term: Attendance in program 2-3 days a week with increased exercise capacity. Reported lower sodium intake. Reported increased fruit and  vegetable intake. Reports medication compliance.;Long term: Adoption of self-care skills and reduction of barriers for early signs and symptoms recognition and intervention leading to self-care maintenance.;Short term: Daily weights obtained and reported for increase. Utilizing diuretic protocols set by physician.    Hypertension Yes    Intervention Provide education on lifestyle modifcations including regular physical activity/exercise, weight management, moderate sodium restriction and increased consumption of fresh fruit, vegetables, and low fat dairy, alcohol moderation, and smoking cessation.;Monitor prescription use compliance.    Expected Outcomes Long Term: Maintenance of blood pressure at goal levels.;Short Term: Continued assessment and intervention until BP is < 140/41m HG in hypertensive participants. < 130/839mHG in hypertensive participants with diabetes, heart failure or chronic kidney disease.    Lipids Yes    Intervention Provide education and support for participant on nutrition & aerobic/resistive exercise along with prescribed medications to achieve LDL '70mg'$ , HDL >'40mg'$ .    Expected Outcomes Short Term: Participant states understanding of desired cholesterol values and is compliant with medications prescribed. Participant is following exercise prescription and nutrition guidelines.;Long Term: Cholesterol controlled with medications as prescribed, with individualized exercise RX and with personalized nutrition plan. Value goals: LDL < '70mg'$ , HDL > 40 mg.             Education:Diabetes - Individual verbal and written instruction to review signs/symptoms of diabetes, desired ranges of glucose level fasting, after meals and with exercise. Acknowledge that pre and post exercise glucose checks will be done for 3 sessions at entry of program.   Know Your Numbers and Heart Failure: - Group verbal and visual instruction to discuss disease risk factors for cardiac and pulmonary disease  and treatment  options.  Reviews associated critical values for Overweight/Obesity, Hypertension, Cholesterol, and Diabetes.  Discusses basics of heart failure: signs/symptoms and treatments.  Introduces Heart Failure Zone chart for action plan for heart failure.  Written material given at graduation. Flowsheet Row Pulmonary Rehab from 07/30/2022 in [facility] Cardiac and Pulmonary Rehab  Education need identified 07/30/22       Core Components/Risk Factors/Patient Goals Review:    Core Components/Risk Factors/Patient Goals at Discharge (Final Review):    ITP Comments:  ITP Comments     Row Name 07/28/22 1308 07/30/22 1108         ITP Comments Initial orientation completed. Diagnosis can be found in [facility] 2/28. EP Orientation scheduled for Thursday 3/14 at 9:30am. Completed 6MWT and gym orientation. Initial ITP created and sent for review to Dr. Ottie Glazier, Medical Director.               Comments: Initial ITP

## 2022-08-01 ENCOUNTER — Other Ambulatory Visit (HOSPITAL_COMMUNITY): Payer: Self-pay

## 2022-08-02 ENCOUNTER — Emergency Department
Admission: EM | Admit: 2022-08-02 | Discharge: 2022-08-02 | Disposition: A | Discharge status: Home / Self Care | Payer: Medicaid Other | Attending: Emergency Medicine | Admitting: Emergency Medicine

## 2022-08-02 ENCOUNTER — Other Ambulatory Visit: Payer: Self-pay

## 2022-08-02 DIAGNOSIS — K047 Periapical abscess without sinus: Secondary | ICD-10-CM | POA: Diagnosis not present

## 2022-08-02 DIAGNOSIS — R220 Localized swelling, mass and lump, head: Secondary | ICD-10-CM | POA: Diagnosis present

## 2022-08-02 MED ORDER — AMOXICILLIN 875 MG PO TABS: 875.0000 mg | ORAL_TABLET | Freq: Two times a day (BID) | ORAL | 0 refills | Status: DC

## 2022-08-02 NOTE — ED Provider Notes (Signed)
[facility] Provider Note    Event Date/Time   First MD Initiated Contact with Patient 08/02/22 [phone]     (approximate)   History   Dental Pain   HPI  Clarence [name] is a 52 y.o. male   presents to the ED with complaint of dental pain/mouth swelling.  Patient states that he ate some pizza right out of the oven and thought initially he had a burn from the pizza.  He states he woke up this morning with continued discomfort to the back of his tooth and mouth area.      Physical Exam   Triage Vital Signs: ED Triage Vitals [08/02/22 0816]  Enc Vitals Group     BP (!) 184/104     Pulse Rate 67     Resp 17     Temp 97.9 F (36.6 C)     Temp src      SpO2 100 %     Weight      Height      Head Circumference      Peak Flow      Pain Score 4     Pain Loc      Pain Edu?      Excl. in [place]?     Most recent vital signs: Vitals:   08/02/22 0816 08/02/22 0946  BP: (!) 184/104 (!) 130/91  Pulse: 67   Resp: 17   Temp: 97.9 F (36.6 C)   SpO2: 100%      General: Awake, no distress.  CV:  Good peripheral perfusion.  Resp:  Normal effort.  Abd:  No distention.  Other:  Upper central incisor and very poor repair and hygiene.  There is some soft tissue edema posteriorly noted at the base of the central incisor without active drainage.  There is also some erythema present.   ED Results / Procedures / Treatments   Labs (all labs ordered are listed, but only abnormal results are displayed) Labs Reviewed - No data to display     PROCEDURES:  Critical Care performed:   Procedures   MEDICATIONS ORDERED IN ED: Medications - No data to display   IMPRESSION / MDM / [place] / ED COURSE  I reviewed the triage vital signs and the nursing notes.   Differential diagnosis includes, but is not limited to, dental cary, dental abscess, dental injury, oral mucosa burn.  52 year old male presents to the ED with complaint of pain to  his mouth after eating some hot pizza.  On exam teeth are in very poor repair and it appears that his upper right central incisor may have an early abscess as noted above.  Patient was started on Amoxil 875 twice daily for 10 days and given a list of dental clinics for him to follow-up with.      Patient's presentation is most consistent with acute complicated illness / injury requiring diagnostic workup.  FINAL CLINICAL IMPRESSION(S) / ED DIAGNOSES   Final diagnoses:  Dental abscess     Rx / DC Orders   ED Discharge Orders          Ordered    amoxicillin (AMOXIL) 875 MG tablet  2 times daily        08/02/22 0940             Note:  This document was prepared using Dragon voice recognition software and may include unintentional dictation errors.   Johnn Hai, PA-C 08/02/22 1220  Delman Kitten, MD 08/02/22 [phone]

## 2022-08-02 NOTE — ED Notes (Signed)
Pt discharge to home. Pt VSS, GCS 15, NAD. Pt verbalized understanding of discharge instructions with no additional questions at this time.  

## 2022-08-02 NOTE — ED Triage Notes (Signed)
Pt comes with c/o swelling in jaw. Pt states he is unsure if pizza was too hot or what happened. Pt states it is on right side.

## 2022-08-02 NOTE — Discharge Instructions (Addendum)
Follow-up with your primary care provider if any continued problems.  A prescription for amoxicillin 875 twice daily for 10 days was sent to the pharmacy for you to begin taking prior to your dental visit.  A list of dental clinics is on your discharge papers for you to call.  Also your blood pressure was elevated in the emergency department.  You should have your primary care provider recheck your blood pressure to see if any adjustments need to be made on your medications.  OPTIONS FOR DENTAL FOLLOW UP CARE  [place] [facility] and [place] [email or web address] [phone])  [facility] [phone])  [place] [phone])  [place] [phone])  [facility] [phone]) This clinic caters to the indigent population and is on a lottery system. Location: [employer] [facility], [place], [place], [place] Clinic Hours: Wednesdays from 6pm - 9pm, patients seen by a lottery system. For dates, call or go to [email or web address] Services: Cleanings, fillings and simple extractions. Payment Options: DENTAL WORK IS FREE OF CHARGE. Bring proof of income or support. Best way to get seen: Arrive at 5:15 pm - this is a lottery, NOT first come/first serve, so arriving earlier will not increase your chances of being seen.     [facility] [phone] Select option 1 for emergencies   Location: [facility], [place], [address], [place] Clinic Hours: No walk-ins accepted - call the day before to schedule an appointment. Check in times are 9:30 am and 1:30 pm. Services: Simple extractions, temporary fillings, pulpectomy/pulp debridement, uncomplicated abscess drainage. Payment Options: PAYMENT IS DUE AT THE TIME OF  SERVICE.  Fee is usually $100-200, additional surgical procedures (e.g. abscess drainage) may be extra. Cash, checks, Visa/MasterCard accepted.  Can file Medicaid if patient is covered for dental - patient should call case worker to check. No discount for [facility] patients. Best way to get seen: MUST call the day before and get onto the schedule. Can usually be seen the next 1-2 days. No walk-ins accepted.     [place] [phone]   Location: [place], [place] Clinic Hours: M, W, Th, F 8am or 1:30pm, Tues 9a or 1:30 - first come/first served. Services: Simple extractions, temporary fillings, uncomplicated abscess drainage.  You do not need to be an [facility] resident. Payment Options: PAYMENT IS DUE AT THE TIME OF SERVICE. Dental insurance, otherwise sliding scale - bring proof of income or support. Depending on income and treatment needed, cost is usually $50-200. Best way to get seen: Arrive early as it is first come/first served.     [facility] [phone]   Location: [place] [facility] Hours: Mon-Thu 8a-5p Services: Most basic dental services including extractions and fillings. Payment Options: PAYMENT IS DUE AT THE TIME OF SERVICE. Sliding scale, up to 50% off - bring proof if income or support. Medicaid with dental option accepted. Best way to get seen: Call to schedule an appointment, can usually be seen within 2 weeks OR they will try to see walk-ins - show up at [place] or 2p (you may have to wait).     [facility] [place] RESIDENTS ONLY   Location: [facility], [place] [address] [place], [place] [address] Clinic Hours: By appointment only. Monday -  Thursday 8am-5pm, Friday 8am-12pm Services: Cleanings, fillings, extractions. Payment Options: PAYMENT IS DUE AT THE TIME OF SERVICE. Cash, Visa  or MasterCard. Sliding scale - $30 minimum per service. Best way to get seen: Come in to office, complete packet and make an appointment - need proof of income or support monies for each household member and proof of [facility] residence. Usually takes about a month to get in.     [facility] [phone]   Location: [address] [place] Clinic Hours: Walk-in Urgent Care Dental Services are offered Monday-Friday mornings only. The numbers of emergencies accepted daily is limited to the number of providers available. Maximum 15 - Mondays, Wednesdays & Thursdays Maximum 10 - Tuesdays & Fridays Services: You do not need to be a [facility] resident to be seen for a dental emergency. Emergencies are defined as pain, swelling, abnormal bleeding, or dental trauma. Walkins will receive x-rays if needed. NOTE: Dental cleaning is not an emergency. Payment Options: PAYMENT IS DUE AT THE TIME OF SERVICE. Minimum co-pay is $40.00 for uninsured patients. Minimum co-pay is $3.00 for Medicaid with dental coverage. Dental Insurance is accepted and must be presented at time of visit. Medicare does not cover dental. Forms of payment: Cash, credit card, checks. Best way to get seen: If not previously registered with the clinic, walk-in dental registration begins at 7:15 am and is on a first come/first serve basis. If previously registered with the clinic, call to make an appointment.     [facility] [place] ONLY   Location: [address] [place], [place] Clinic Hours: Mon-Thu 10a-2p Services: Extractions only! Payment Options: FREE (donations accepted) - bring proof of income or support Best way to get seen: Call and schedule an appointment OR come at 8am on the 1st Monday of every month (except for holidays) when it is first come/first served.     [facility] [place] [phone]   Location: [place],  [place] Clinic Hours: Friday mornings Services, Payment Options, Best way to get seen: Call for info

## 2022-08-03 ENCOUNTER — Telehealth: Payer: Self-pay

## 2022-08-03 ENCOUNTER — Encounter: Payer: Medicaid Other | Admitting: *Deleted

## 2022-08-03 ENCOUNTER — Other Ambulatory Visit (HOSPITAL_COMMUNITY): Payer: Self-pay

## 2022-08-03 DIAGNOSIS — I5042 Chronic combined systolic (congestive) and diastolic (congestive) heart failure: Secondary | ICD-10-CM

## 2022-08-03 DIAGNOSIS — Z5189 Encounter for other specified aftercare: Secondary | ICD-10-CM | POA: Diagnosis not present

## 2022-08-03 NOTE — Progress Notes (Signed)
Daily Session Note  Patient Details  Name: Clarence [name] MRN: [id number] Date of Birth: 1970-05-30 Referring Provider:   Flowsheet Row Pulmonary Rehab from 07/30/2022 in [facility] Cardiac and Pulmonary Rehab  Referring Provider Dr. Nelva Bush, MD       Encounter Date: 08/03/2022  Check In:  Session Check In - 08/03/22 1538       Check-In   Supervising physician immediately available to respond to emergencies See telemetry face sheet for immediately available ER MD    Location [facility]-Cardiac & Pulmonary Rehab    Staff Present Renita Papa, RN BSN;Noah Tickle, BS, Exercise Physiologist;Joseph Tessie Fass, [place]    Virtual Visit No    Medication changes reported     No    Fall or balance concerns reported    No    Warm-up and Cool-down Performed on first and last piece of equipment    Resistance Training Performed Yes    VAD Patient? No    PAD/SET Patient? No      Pain Assessment   Currently in Pain? No/denies                Social History   Tobacco Use  Smoking Status Former   Types: Cigars   Quit date: 08/13/2021   Years since quitting: 0.9  Smokeless Tobacco Not on file    Goals Met:  Independence with exercise equipment Exercise tolerated well No report of concerns or symptoms today Strength training completed today  Goals Unmet:  Not Applicable  Comments: First full day of exercise!  Patient was oriented to gym and equipment including functions, settings, policies, and procedures.  Patient's individual exercise prescription and treatment plan were reviewed.  All starting workloads were established based on the results of the 6 minute walk test done at initial orientation visit.  The plan for exercise progression was also introduced and progression will be customized based on patient's performance and goals.    Dr. Emily Filbert is Medical Director for [place].  Dr. Ottie Glazier is Medical Director for [facility] Pulmonary  Rehabilitation.

## 2022-08-03 NOTE — Transitions of Care (Post Inpatient/ED Visit) (Addendum)
   08/03/2022  Name: ZAHRAN BURFORD MRN: [id number] DOB: 1971/01/11  Today's TOC FU Call Status: Today's TOC FU Call Status:: Successful TOC FU Call Competed TOC FU Call Complete Date: 08/03/22  Transition Care Management Follow-up Telephone Call Date of Discharge: 08/02/22 Discharge Facility: [facility]) Type of Discharge: Inpatient Admission Primary Inpatient Discharge Diagnosis:: Dental Abscess Reason for ED Visit: Other: (Dental Abscess) How have you been since you were released from the hospital?: Better Any questions or concerns?: No  Items Reviewed: Did you receive and understand the discharge instructions provided?: Yes Medications obtained and verified?: Yes (Medications Reviewed) Any new allergies since your discharge?: Yes Dietary orders reviewed?: No Do you have support at home?: Yes People in Home: parent(s) Name of Support/Comfort Primary Source: Alfred Levins and Equipment/Supplies: [place] Ordered?: No Any new equipment or medical supplies ordered?: No  Functional Questionnaire: Do you need assistance with bathing/showering or dressing?: No Do you need assistance with meal preparation?: No Do you need assistance with eating?: No Do you have difficulty maintaining continence: No Do you need assistance with getting out of bed/getting out of a chair/moving?: No Do you have difficulty managing or taking your medications?: No  Folllow up appointments reviewed: PCP Follow-up appointment confirmed?: Yes Date of PCP follow-up appointment?: 08/11/22 Follow-up Provider: Marlowe Sax, NP [facility] Follow-up appointment confirmed?: NA Do you need transportation to your follow-up appointment?: No Do you understand care options if your condition(s) worsen?: Yes-patient verbalized understanding    SIGNATURE: [place].D/RMA

## 2022-08-05 ENCOUNTER — Encounter: Payer: Self-pay | Admitting: *Deleted

## 2022-08-05 ENCOUNTER — Encounter: Payer: Medicaid Other | Admitting: *Deleted

## 2022-08-05 DIAGNOSIS — Z5189 Encounter for other specified aftercare: Secondary | ICD-10-CM | POA: Diagnosis not present

## 2022-08-05 DIAGNOSIS — I5042 Chronic combined systolic (congestive) and diastolic (congestive) heart failure: Secondary | ICD-10-CM | POA: Diagnosis not present

## 2022-08-05 NOTE — Progress Notes (Signed)
Pulmonary Individual Treatment Plan  Patient Details  Name: Clarence [name] MRN: [id number] Date of Birth: [birth year] Referring Provider:   Flowsheet Row Pulmonary Rehab from 07/30/2022 in [facility] Cardiac and Pulmonary Rehab  Referring Provider Dr. Nelva Bush, MD       Initial Encounter Date:  Flowsheet Row Pulmonary Rehab from 07/30/2022 in [facility] Cardiac and Pulmonary Rehab  Date 07/30/22       Visit Diagnosis: Chronic combined systolic and diastolic congestive heart failure ([place])  Patient's Home Medications on Admission:  Current Outpatient Medications:    acetaminophen (TYLENOL) 325 MG tablet, Take 650 mg by mouth at bedtime., Disp: , Rfl:    amoxicillin (AMOXIL) 875 MG tablet, Take 1 tablet (875 mg total) by mouth 2 (two) times daily., Disp: 20 tablet, Rfl: 0   aspirin EC 81 MG tablet, Take 1 tablet (81 mg total) by mouth daily. Swallow whole., Disp: 30 tablet, Rfl: 4   atorvastatin (LIPITOR) 80 MG tablet, Take 1 tablet (80 mg total) by mouth every evening., Disp: 30 tablet, Rfl: 4   carvedilol (COREG) 12.5 MG tablet, Take 1&1/2 tablets (18.75 mg total) by mouth 2 (two) times daily with a meal., Disp: 90 tablet, Rfl: 3   clopidogrel (PLAVIX) 75 MG tablet, Take 1 tablet (75 mg total) by mouth daily., Disp: 30 tablet, Rfl: 6   empagliflozin (JARDIANCE) 10 MG TABS tablet, Take 10 mg by mouth daily., Disp: , Rfl:    ezetimibe (ZETIA) 10 MG tablet, Take 1 tablet (10 mg total) by mouth daily., Disp: 90 tablet, Rfl: 3   famotidine (PEPCID) 10 MG tablet, Take 10 mg by mouth at bedtime. As needed, Disp: , Rfl:    fenofibrate (TRICOR) 48 MG tablet, Take 1 tablet (48 mg total) by mouth daily., Disp: 30 tablet, Rfl: 8   furosemide (LASIX) 20 MG tablet, Take 20 mg by mouth daily as needed. For weight gain of 3 lb in 24 hours or 5 lbs in one week, Disp: , Rfl:    nitroGLYCERIN (NITROSTAT) 0.4 MG SL tablet, Place 1 tablet (0.4 mg total) under the tongue every 5 (five) minutes as needed for  chest pain., Disp: 25 tablet, Rfl: 3   sacubitril-valsartan (ENTRESTO) 97-103 MG, Take 1 tablet by mouth 2 (two) times daily., Disp: 180 tablet, Rfl: 3   spironolactone (ALDACTONE) 25 MG tablet, Take 1 tablet (25 mg total) by mouth daily., Disp: 90 tablet, Rfl: 1  Past Medical History: Past Medical History:  Diagnosis Date   Ascending Aortic Dilation    a. 09/2021 Echo: Asc Ao 31mm.   CAD (coronary artery disease)    a. 07/2021 Cath: LM nl, LAD 28m, D2 40, LCX 65p/m, OM1 90, RCA 142m w/ L-.R collats to RPDA from septal 1/2-->Med rx.   Chronic HFrEF (heart failure with reduced ejection fraction) ([place])    a. 07/2021 Echo: EF 30-35%, glob HK, mod LVH, GrII DD, nl RV fxn, mildly dil LA, mild-mod MR; b. 09/2021 Echo: EF 35-40%, glob HK, mod LVH, GrI DD, nl RV fxn, mildly dil RA, mild MR, Asc Ao 77mm.   CKD (chronic kidney disease) stage 2, GFR 60-89 ml/min    Crohn's disease ([place])    Hypertension    Mixed Ischemic & Nonischemic cardiomyopathy    a. 07/2021 Echo: EF 30-35%; b. 07/2021 Cath: Occluded RCA w/ mod LAD/LCX dzs, and severe OM1 dzs-->Med Rx; c. 09/2021 Echo: EF 35-40%.   PAH (pulmonary artery hypertension) ([place])    a. 07/2021 RHC: PA 67/30 (42).  Proteinuria     Tobacco Use: Social History   Tobacco Use  Smoking Status Former   Types: Cigars   Quit date: 08/13/2021   Years since quitting: 0.9  Smokeless Tobacco Not on file    Labs: Review Flowsheet  More data exists      Latest Ref Rng & Units 09/09/2021 09/10/2021 10/07/2021 01/27/2022 07/15/2022  Labs for ITP Cardiac and Pulmonary Rehab  Cholestrol <200 mg/dL 174  C 140  149  148  147   LDL (calc) mg/dL (calc) UNABLE TO CALCULATE IF TRIGLYCERIDE OVER 400 mg/dL  C 50  43  70  81   Direct LDL 0 - 99 mg/dL 69.0  - - - -  HDL-C > OR = 40 mg/dL 35  C 34  30  35  39   Trlycerides <150 mg/dL 520  C 281  382  214  177     Details      C Corrected result          Pulmonary Assessment Scores:  Pulmonary Assessment Scores      Row Name 07/28/22 1459 07/30/22 1109       ADL UCSD   ADL Phase Entry Entry    SOB Score total 54 54    Rest 5 5    Walk 3 3    Stairs 3 3    Bath 3 3    Dress 2 2    Shop 3 3      CAT Score   CAT Score 22 22      mMRC Score   mMRC Score -- 2             UCSD: Self-administered rating of dyspnea associated with activities of daily living (ADLs) 6-point scale (0 = "not at all" to 5 = "maximal or unable to do because of breathlessness")  Scoring Scores range from 0 to 120.  Minimally important difference is 5 units  CAT: CAT can identify the health impairment of COPD patients and is better correlated with disease progression.  CAT has a scoring range of zero to 40. The CAT score is classified into four groups of low (less than 10), medium (10 - 20), high (21-30) and very high (31-40) based on the impact level of disease on health status. A CAT score over 10 suggests significant symptoms.  A worsening CAT score could be explained by an exacerbation, poor medication adherence, poor inhaler technique, or progression of COPD or comorbid conditions.  CAT MCID is 2 points  mMRC: mMRC (Modified Medical Research Council) Dyspnea Scale is used to assess the degree of baseline functional disability in patients of respiratory disease due to dyspnea. No minimal important difference is established. A decrease in score of 1 point or greater is considered a positive change.   Pulmonary Function Assessment:   Exercise Target Goals: Exercise Program Goal: Individual exercise prescription set using results from initial 6 min walk test and THRR while considering  patient's activity barriers and safety.   Exercise Prescription Goal: Initial exercise prescription builds to 30-45 minutes a day of aerobic activity, 2-3 days per week.  Home exercise guidelines will be given to patient during program as part of exercise prescription that the participant will acknowledge.  Education: Aerobic  Exercise: - Group verbal and visual presentation on the components of exercise prescription. Introduces F.I.T.T principle from ACSM for exercise prescriptions.  Reviews F.I.T.T. principles of aerobic exercise including progression. Written material given at graduation. Flowsheet Row Pulmonary  Rehab from 07/30/2022 in [facility] Cardiac and Pulmonary Rehab  Education need identified 07/30/22       Education: Resistance Exercise: - Group verbal and visual presentation on the components of exercise prescription. Introduces F.I.T.T principle from ACSM for exercise prescriptions  Reviews F.I.T.T. principles of resistance exercise including progression. Written material given at graduation.    Education: Exercise & Equipment Safety: - Individual verbal instruction and demonstration of equipment use and safety with use of the equipment. Flowsheet Row Pulmonary Rehab from 07/30/2022 in [facility] Cardiac and Pulmonary Rehab  Date 07/30/22  Educator NT  Instruction Review Code 1- Verbalizes Understanding       Education: Exercise Physiology & General Exercise Guidelines: - Group verbal and written instruction with models to review the exercise physiology of the cardiovascular system and associated critical values. Provides general exercise guidelines with specific guidelines to those with heart or lung disease.    Education: Flexibility, Balance, Mind/Body Relaxation: - Group verbal and visual presentation with interactive activity on the components of exercise prescription. Introduces F.I.T.T principle from ACSM for exercise prescriptions. Reviews F.I.T.T. principles of flexibility and balance exercise training including progression. Also discusses the mind body connection.  Reviews various relaxation techniques to help reduce and manage stress (i.e. Deep breathing, progressive muscle relaxation, and visualization). Balance handout provided to take home. Written material given at graduation.   Activity  Barriers & Risk Stratification:  Activity Barriers & Cardiac Risk Stratification - 07/30/22 1111       Activity Barriers & Cardiac Risk Stratification   Activity Barriers Joint Problems;Deconditioning;Shortness of Breath   left shoulder pain            6 Minute Walk:  6 Minute Walk     Row Name 07/30/22 1343         6 Minute Walk   Phase Initial     Distance 940 feet     Walk Time 6 minutes     # of Rest Breaks 0     MPH 1.78     METS 2.78     RPE 13     Perceived Dyspnea  2     VO2 Peak 9.73     Symptoms Yes (comment)     Comments SOB     Resting HR 61 bpm     Resting BP 104/62     Resting Oxygen Saturation  94 %     Exercise Oxygen Saturation  during 6 min walk 94 %     Max Ex. HR 90 bpm     Max Ex. BP 124/72     2 Minute Post BP 100/64       Interval HR   1 Minute HR 83     2 Minute HR 88     3 Minute HR 86     4 Minute HR 86     5 Minute HR 90     6 Minute HR 84     2 Minute Post HR 61     Interval Heart Rate? Yes       Interval Oxygen   Interval Oxygen? Yes     Baseline Oxygen Saturation % 94 %     1 Minute Oxygen Saturation % 94 %     1 Minute Liters of Oxygen 0 L  RA     2 Minute Liters of Oxygen 0 L  RA     3 Minute Oxygen Saturation % 94 %     3 Minute Liters  of Oxygen 0 L  RA     4 Minute Oxygen Saturation % 94 %     4 Minute Liters of Oxygen 0 L  RA     5 Minute Oxygen Saturation % 94 %     5 Minute Liters of Oxygen 0 L  RA     6 Minute Oxygen Saturation % 94 %     6 Minute Liters of Oxygen 0 L  RA     2 Minute Post Oxygen Saturation % 96 %     2 Minute Post Liters of Oxygen 0 L  RA             Oxygen Initial Assessment:  Oxygen Initial Assessment - 07/28/22 1416       Home Oxygen   Home Oxygen Device None    Sleep Oxygen Prescription None    Home Exercise Oxygen Prescription None    Home Resting Oxygen Prescription None    Compliance with Home Oxygen Use Yes      Intervention   Short Term Goals To learn and understand  importance of maintaining oxygen saturations>88%;To learn and demonstrate proper pursed lip breathing techniques or other breathing techniques. ;To learn and understand importance of monitoring SPO2 with pulse oximeter and demonstrate accurate use of the pulse oximeter.    Long  Term Goals Maintenance of O2 saturations>88%;Compliance with respiratory medication;Verbalizes importance of monitoring SPO2 with pulse oximeter and return demonstration;Exhibits proper breathing techniques, such as pursed lip breathing or other method taught during program session             Oxygen Re-Evaluation:  Oxygen Re-Evaluation     Row Name 08/03/22 1540             Goals/Expected Outcomes   Comments Reviewed PLB technique with pt.  Talked about how it works and it's importance in maintaining their exercise saturations.       Goals/Expected Outcomes Short: Become more profiecient at using PLB.   Long: Become independent at using PLB.                Oxygen Discharge (Final Oxygen Re-Evaluation):  Oxygen Re-Evaluation - 08/03/22 1540       Goals/Expected Outcomes   Comments Reviewed PLB technique with pt.  Talked about how it works and it's importance in maintaining their exercise saturations.    Goals/Expected Outcomes Short: Become more profiecient at using PLB.   Long: Become independent at using PLB.             Initial Exercise Prescription:  Initial Exercise Prescription - 07/30/22 1400       Date of Initial Exercise RX and Referring Provider   Date 07/30/22    Referring Provider Dr. Nelva Bush, MD      Oxygen   Maintain Oxygen Saturation 88% or higher      Treadmill   MPH 1.8    Grade 0.5    Minutes 15    METs 2.5      Recumbant Bike   Level 1    RPM 50    Watts 25    Minutes 15    METs 2.78      NuStep   Level 2    SPM 80    Minutes 15    METs 2.78      REL-XR   Level 2    Speed 50    Minutes 15    METs 2.78      Prescription Details  Frequency (times per week) 3    Duration Progress to 30 minutes of continuous aerobic without signs/symptoms of physical distress      Intensity   THRR 40-80% of Max Heartrate 103-145    Ratings of Perceived Exertion 11-13    Perceived Dyspnea 0-4      Progression   Progression Continue to progress workloads to maintain intensity without signs/symptoms of physical distress.      Resistance Training   Training Prescription Yes    Weight 4 lb    Reps 10-15             Perform Capillary Blood Glucose checks as needed.  Exercise Prescription Changes:   Exercise Prescription Changes     Row Name 07/30/22 1400             Response to Exercise   Blood Pressure (Admit) 104/62       Blood Pressure (Exercise) 124/72       Blood Pressure (Exit) 100/64       Heart Rate (Admit) 61 bpm       Heart Rate (Exercise) 90 bpm       Heart Rate (Exit) 61 bpm       Oxygen Saturation (Admit) 94 %       Oxygen Saturation (Exercise) 94 %       Oxygen Saturation (Exit) 96 %       Rating of Perceived Exertion (Exercise) 13       Perceived Dyspnea (Exercise) 2       Symptoms SOB       Comments 6MWT Results                Exercise Comments:   Exercise Comments     Row Name 08/03/22 1539           Exercise Comments First full day of exercise!  Patient was oriented to gym and equipment including functions, settings, policies, and procedures.  Patient's individual exercise prescription and treatment plan were reviewed.  All starting workloads were established based on the results of the 6 minute walk test done at initial orientation visit.  The plan for exercise progression was also introduced and progression will be customized based on patient's performance and goals.                Exercise Goals and Review:   Exercise Goals     Row Name 07/30/22 1112             Exercise Goals   Increase Physical Activity Yes       Intervention Provide advice, education, support  and counseling about physical activity/exercise needs.;Develop an individualized exercise prescription for aerobic and resistive training based on initial evaluation findings, risk stratification, comorbidities and participant's personal goals.       Expected Outcomes Short Term: Attend rehab on a regular basis to increase amount of physical activity.;Long Term: Add in home exercise to make exercise part of routine and to increase amount of physical activity.;Long Term: Exercising regularly at least 3-5 days a week.       Increase Strength and Stamina Yes       Intervention Provide advice, education, support and counseling about physical activity/exercise needs.;Develop an individualized exercise prescription for aerobic and resistive training based on initial evaluation findings, risk stratification, comorbidities and participant's personal goals.       Expected Outcomes Short Term: Increase workloads from initial exercise prescription for resistance, speed, and METs.;Long Term: Improve cardiorespiratory fitness, muscular  endurance and strength as measured by increased METs and functional capacity (6MWT);Short Term: Perform resistance training exercises routinely during rehab and add in resistance training at home       Able to understand and use rate of perceived exertion (RPE) scale Yes       Intervention Provide education and explanation on how to use RPE scale       Expected Outcomes Short Term: Able to use RPE daily in rehab to express subjective intensity level;Long Term:  Able to use RPE to guide intensity level when exercising independently       Able to understand and use Dyspnea scale Yes       Intervention Provide education and explanation on how to use Dyspnea scale       Expected Outcomes Short Term: Able to use Dyspnea scale daily in rehab to express subjective sense of shortness of breath during exertion;Long Term: Able to use Dyspnea scale to guide intensity level when exercising  independently       Knowledge and understanding of Target Heart Rate Range (THRR) Yes       Intervention Provide education and explanation of THRR including how the numbers were predicted and where they are located for reference       Expected Outcomes Short Term: Able to state/look up THRR;Long Term: Able to use THRR to govern intensity when exercising independently;Short Term: Able to use daily as guideline for intensity in rehab       Able to check pulse independently Yes       Intervention Provide education and demonstration on how to check pulse in carotid and radial arteries.;Review the importance of being able to check your own pulse for safety during independent exercise       Expected Outcomes Short Term: Able to explain why pulse checking is important during independent exercise;Long Term: Able to check pulse independently and accurately       Understanding of Exercise Prescription Yes       Intervention Provide education, explanation, and written materials on patient's individual exercise prescription       Expected Outcomes Short Term: Able to explain program exercise prescription;Long Term: Able to explain home exercise prescription to exercise independently                Exercise Goals Re-Evaluation :  Exercise Goals Re-Evaluation     Row Name 08/03/22 1539             Exercise Goal Re-Evaluation   Exercise Goals Review Increase Physical Activity;Able to understand and use rate of perceived exertion (RPE) scale;Knowledge and understanding of Target Heart Rate Range (THRR);Understanding of Exercise Prescription;Increase Strength and Stamina;Able to check pulse independently;Able to understand and use Dyspnea scale       Comments Reviewed RPE scale, THR and program prescription with pt today.  Pt voiced understanding and was given a copy of goals to take home.       Expected Outcomes Short: Use RPE daily to regulate intensity.  Long: Follow program prescription in THR.                 Discharge Exercise Prescription (Final Exercise Prescription Changes):  Exercise Prescription Changes - 07/30/22 1400       Response to Exercise   Blood Pressure (Admit) 104/62    Blood Pressure (Exercise) 124/72    Blood Pressure (Exit) 100/64    Heart Rate (Admit) 61 bpm    Heart Rate (Exercise) 90 bpm  Heart Rate (Exit) 61 bpm    Oxygen Saturation (Admit) 94 %    Oxygen Saturation (Exercise) 94 %    Oxygen Saturation (Exit) 96 %    Rating of Perceived Exertion (Exercise) 13    Perceived Dyspnea (Exercise) 2    Symptoms SOB    Comments 6MWT Results             Nutrition:  Target Goals: Understanding of nutrition guidelines, daily intake of sodium 1500mg , cholesterol 200mg , calories 30% from fat and 7% or less from saturated fats, daily to have 5 or more servings of fruits and vegetables.  Education: All About Nutrition: -Group instruction provided by verbal, written material, interactive activities, discussions, models, and posters to present general guidelines for heart healthy nutrition including fat, fiber, MyPlate, the role of sodium in heart healthy nutrition, utilization of the nutrition label, and utilization of this knowledge for meal planning. Follow up email sent as well. Written material given at graduation. Flowsheet Row Pulmonary Rehab from 07/30/2022 in [facility] Cardiac and Pulmonary Rehab  Education need identified 07/30/22       Biometrics:  Pre Biometrics - 07/30/22 1112       Pre Biometrics   Height 5' 9.75" (1.772 m)    Weight 225 lb 9.6 oz (102.3 kg)    Waist Circumference 43 inches    Hip Circumference 44 inches    Waist to Hip Ratio 0.98 %    BMI (Calculated) 32.59    Single Leg Stand 30 seconds              Nutrition Therapy Plan and Nutrition Goals:  Nutrition Therapy & Goals - 07/30/22 1038       Nutrition Therapy   Diet Heart healthy, low Na    Drug/Food Interactions Statins/Certain Fruits    Protein (specify  units) 80-85g    Fiber 35 grams    Whole Grain Foods 3 servings    Saturated Fats 16 max. grams    Fruits and Vegetables 8 servings/day    Sodium 2 grams      Personal Nutrition Goals   Nutrition Goal ST: practice MyPlate guidelines, practice reading food labels  LT: limit Na <2g/day, limit saturated fat <16g/day, aim for 30-35g of fiber per day    Comments 52 y.o. M admitted to pulmonary rehab for Chronic combined systolic and diastolic congestive heart failure. PMHx includes CAD, CKD stg 2, crohn's disease, HTN, pulmonary artery hypertension. Reviewed relevant medications atorvastatin, jardiance, zetia, pepcid, furosemide. Reviewed most recent labs. Shana reports that his Crohns has been well controlled and his main trigger foods are certain types of processed meats; eating smaller/more frequent meals help to manage his symptoms as well. Jamarkis reports limiting chinese food, pizza, and fried foods. He has been trying to use a grill for his cooking to avoid lots of added fat or grease. He has been trying to cut back on soda and now only has it occassionally. Discussed food patterns and heart healthy eating as well as MyPlate. Encouagred him to read labels, choose liquid cooking oils, and to include protein, healthy fat, and fiber at meals to make them more nutrient dense and satiating. Discussed CKD stg 2 MNT and how a modest protei is recommeneded (0.8 g/kg/d) - encouraged to be mindful of animal protein portions and to include plant protein.      Intervention Plan   Intervention Prescribe, educate and counsel regarding individualized specific dietary modifications aiming towards targeted core components such as weight, hypertension,  lipid management, diabetes, heart failure and other comorbidities.;Nutrition handout(s) given to patient.    Expected Outcomes Short Term Goal: Understand basic principles of dietary content, such as calories, fat, sodium, cholesterol and nutrients.;Short Term Goal: A  plan has been developed with personal nutrition goals set during dietitian appointment.;Long Term Goal: Adherence to prescribed nutrition plan.             Nutrition Assessments:  MEDIFICTS Score Key: ?70 Need to make dietary changes  40-70 Heart Healthy Diet ? 40 Therapeutic Level Cholesterol Diet  Flowsheet Row Pulmonary Rehab from 07/28/2022 in [facility] Cardiac and Pulmonary Rehab  Picture Your Plate Total Score on Admission 51      Picture Your Plate Scores: [id number] Unhealthy dietary pattern with much room for improvement. 41-50 Dietary pattern unlikely to meet recommendations for good health and room for improvement. 51-60 More healthful dietary pattern, with some room for improvement.  >60 Healthy dietary pattern, although there may be some specific behaviors that could be improved.   Nutrition Goals Re-Evaluation:   Nutrition Goals Discharge (Final Nutrition Goals Re-Evaluation):   Psychosocial: Target Goals: Acknowledge presence or absence of significant depression and/or stress, maximize coping skills, provide positive support system. Participant is able to verbalize types and ability to use techniques and skills needed for reducing stress and depression.   Education: Stress, Anxiety, and Depression - Group verbal and visual presentation to define topics covered.  Reviews how body is impacted by stress, anxiety, and depression.  Also discusses healthy ways to reduce stress and to treat/manage anxiety and depression.  Written material given at graduation.   Education: Sleep Hygiene -Provides group verbal and written instruction about how sleep can affect your health.  Define sleep hygiene, discuss sleep cycles and impact of sleep habits. Review good sleep hygiene tips.    Initial Review & Psychosocial Screening:  Initial Psych Review & Screening - 07/28/22 1349       Initial Review   Current issues with Current Stress Concerns    Source of Stress Concerns Chronic  Illness      Family Dynamics   Good Support System? Yes   mother     Barriers   Psychosocial barriers to participate in program There are no identifiable barriers or psychosocial needs.;The patient should benefit from training in stress management and relaxation.      Screening Interventions   Interventions To provide support and resources with identified psychosocial needs;Encouraged to exercise;Provide feedback about the scores to participant    Expected Outcomes Short Term goal: Utilizing psychosocial counselor, staff and physician to assist with identification of specific Stressors or current issues interfering with healing process. Setting desired goal for each stressor or current issue identified.;Long Term Goal: Stressors or current issues are controlled or eliminated.;Short Term goal: Identification and review with participant of any Quality of Life or Depression concerns found by scoring the questionnaire.;Long Term goal: The participant improves quality of Life and PHQ9 Scores as seen by post scores and/or verbalization of changes             Quality of Life Scores:  Scores of 19 and below usually indicate a poorer quality of life in these areas.  A difference of  2-3 points is a clinically meaningful difference.  A difference of 2-3 points in the total score of the Quality of Life Index has been associated with significant improvement in overall quality of life, self-image, physical symptoms, and general health in studies assessing change in quality of life.  PHQ-9: Review Flowsheet       07/30/2022 07/15/2022 10/29/2021 10/14/2021  Depression screen PHQ 2/9  Decreased Interest 3 0 0 0  Down, Depressed, Hopeless 0 0 0 0  PHQ - 2 Score 3 0 0 0  Altered sleeping 3 0 - -  Tired, decreased energy 3 0 - -  Change in appetite 2 0 - -  Feeling bad or failure about yourself  0 0 - -  Trouble concentrating 0 0 - -  Moving slowly or fidgety/restless 0 0 - -  Suicidal thoughts 0 0 -  -  PHQ-9 Score 11 0 - -  Difficult doing work/chores Very difficult Not difficult at all - -   Interpretation of Total Score  Total Score Depression Severity:  1-4 = Minimal depression, 5-9 = Mild depression, 10-14 = Moderate depression, 15-19 = Moderately severe depression, 20-27 = Severe depression   Psychosocial Evaluation and Intervention:  Psychosocial Evaluation - 07/28/22 1418       Psychosocial Evaluation & Interventions   Interventions Stress management education;Relaxation education;Encouraged to exercise with the program and follow exercise prescription    Comments 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 His most recent EF is 35-40%. He reports having two heart attacks in the past. 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 He is living with his mother since his heart attack. He is hesitant to exercise or get back to his normal routine because he is scared about his heart and doesn't want anything to happen to him for his mother's sake. He mentioned that if he goes out, he always makes sure he is close to a hospital in case something happens. He enjoys talking about his job, but doesn't know if he will be able to do all that is required physically. 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 He wants to come to the program to work on his strength and stamina. He also wants more education on what is going on with his heart and his prognosis.    Expected Outcomes Short: attend pulmonary rehab for education and exercise. Long: develop and maintain positive self care habits.    Continue Psychosocial Services  Follow up required by staff             Psychosocial Re-Evaluation:   Psychosocial Discharge (Final Psychosocial Re-Evaluation):   Education: Education Goals: Education classes will be provided on a weekly basis, covering required topics.  Participant will state understanding/return demonstration of topics presented.  Learning Barriers/Preferences:  Learning Barriers/Preferences - 07/28/22 1335       Learning Barriers/Preferences   Learning Barriers None    Learning Preferences None             General Pulmonary Education Topics:  Infection Prevention: - Provides verbal and written material to individual with discussion of infection control including proper hand washing and proper equipment cleaning during exercise session. Flowsheet Row Pulmonary Rehab from 07/30/2022 in [facility] Cardiac and Pulmonary Rehab  Date 07/30/22  Educator NT  Instruction Review Code 1- Verbalizes Understanding       Falls Prevention: - Provides verbal and written material to individual with discussion of falls prevention and safety. Flowsheet Row Pulmonary Rehab from 07/30/2022 in [facility] Cardiac and Pulmonary Rehab  Date 07/30/22  Educator NT  Instruction Review Code 1- Verbalizes Understanding       Chronic Lung Disease Review: -  Group verbal instruction with posters, models, PowerPoint presentations and videos,  to review new updates, new respiratory medications, new advancements in procedures and treatments. Providing information on websites and "800" numbers for continued self-education. Includes information about supplement oxygen, available portable oxygen systems, continuous and intermittent flow rates, oxygen safety, concentrators, and Medicare reimbursement for oxygen. Explanation of Pulmonary Drugs, including class, frequency, complications, importance of spacers, rinsing mouth after steroid MDI's, and proper cleaning methods for nebulizers. Review of basic lung anatomy and physiology related to function, structure, and complications of lung disease. Review of risk factors. Discussion about methods for diagnosing sleep apnea and types of masks and machines for OSA. Includes a review of the use of types of environmental controls: home  humidity, furnaces, filters, dust mite/pet prevention, HEPA vacuums. Discussion about weather changes, air quality and the benefits of nasal washing. Instruction on Warning signs, infection symptoms, calling MD promptly, preventive modes, and value of vaccinations. Review of effective airway clearance, coughing and/or vibration techniques. Emphasizing that all should Create an Action Plan. Written material given at graduation.   AED/CPR: - Group verbal and written instruction with the use of models to demonstrate the basic use of the AED with the basic ABC's of resuscitation.    Anatomy and Cardiac Procedures: - Group verbal and visual presentation and models provide information about basic cardiac anatomy and function. Reviews the testing methods done to diagnose heart disease and the outcomes of the test results. Describes the treatment choices: Medical Management, Angioplasty, or Coronary Bypass Surgery for treating various heart conditions including Myocardial Infarction, Angina, Valve Disease, and Cardiac Arrhythmias.  Written material given at graduation. Flowsheet Row Pulmonary Rehab from 07/30/2022 in [facility] Cardiac and Pulmonary Rehab  Education need identified 07/30/22       Medication Safety: - Group verbal and visual instruction to review commonly prescribed medications for heart and lung disease. Reviews the medication, class of the drug, and side effects. Includes the steps to properly store meds and maintain the prescription regimen.  Written material given at graduation. Flowsheet Row Pulmonary Rehab from 07/30/2022 in [facility] Cardiac and Pulmonary Rehab  Education need identified 07/30/22       Other: -Provides group and verbal instruction on various topics (see comments)   Knowledge Questionnaire Score:  Knowledge Questionnaire Score - 07/28/22 1458       Knowledge Questionnaire Score   Pre Score 15/26              Core Components/Risk Factors/Patient Goals at  Admission:  Personal Goals and Risk Factors at Admission - 07/30/22 1110       Core Components/Risk Factors/Patient Goals on Admission    Weight Management Yes;Weight Loss    Intervention Weight Management: Develop a combined nutrition and exercise program designed to reach desired caloric intake, while maintaining appropriate intake of nutrient and fiber, sodium and fats, and appropriate energy expenditure required for the weight goal.;Weight Management: Provide education and appropriate resources to help participant work on and attain dietary goals.;Weight Management/Obesity: Establish reasonable short term and long term weight goals.    Admit Weight 225 lb 9.6 oz (102.3 kg)    Goal Weight: Short Term 215 lb (97.5 kg)    Goal Weight: Long Term 195 lb (88.5 kg)    Expected Outcomes Weight Loss: Understanding of general recommendations for a balanced deficit meal plan, which promotes 1-2 lb weight loss per week and includes a negative energy balance of [phone] kcal/d;Short Term: Continue to assess and modify interventions until short  term weight is achieved;Long Term: Adherence to nutrition and physical activity/exercise program aimed toward attainment of established weight goal;Understanding recommendations for meals to include 15-35% energy as protein, 25-35% energy from fat, 35-60% energy from carbohydrates, less than 200mg  of dietary cholesterol, 20-35 gm of total fiber daily;Understanding of distribution of calorie intake throughout the day with the consumption of 4-5 meals/snacks    Heart Failure Yes    Intervention Provide a combined exercise and nutrition program that is supplemented with education, support and counseling about heart failure. Directed toward relieving symptoms such as shortness of breath, decreased exercise tolerance, and extremity edema.    Expected Outcomes Improve functional capacity of life;Short term: Attendance in program 2-3 days a week with increased exercise capacity.  Reported lower sodium intake. Reported increased fruit and vegetable intake. Reports medication compliance.;Long term: Adoption of self-care skills and reduction of barriers for early signs and symptoms recognition and intervention leading to self-care maintenance.;Short term: Daily weights obtained and reported for increase. Utilizing diuretic protocols set by physician.    Hypertension Yes    Intervention Provide education on lifestyle modifcations including regular physical activity/exercise, weight management, moderate sodium restriction and increased consumption of fresh fruit, vegetables, and low fat dairy, alcohol moderation, and smoking cessation.;Monitor prescription use compliance.    Expected Outcomes Long Term: Maintenance of blood pressure at goal levels.;Short Term: Continued assessment and intervention until BP is < 140/66mm HG in hypertensive participants. < 130/35mm HG in hypertensive participants with diabetes, heart failure or chronic kidney disease.    Lipids Yes    Intervention Provide education and support for participant on nutrition & aerobic/resistive exercise along with prescribed medications to achieve LDL 70mg , HDL >40mg .    Expected Outcomes Short Term: Participant states understanding of desired cholesterol values and is compliant with medications prescribed. Participant is following exercise prescription and nutrition guidelines.;Long Term: Cholesterol controlled with medications as prescribed, with individualized exercise RX and with personalized nutrition plan. Value goals: LDL < 70mg , HDL > 40 mg.             Education:Diabetes - Individual verbal and written instruction to review signs/symptoms of diabetes, desired ranges of glucose level fasting, after meals and with exercise. Acknowledge that pre and post exercise glucose checks will be done for 3 sessions at entry of program.   Know Your Numbers and Heart Failure: - Group verbal and visual instruction to  discuss disease risk factors for cardiac and pulmonary disease and treatment options.  Reviews associated critical values for Overweight/Obesity, Hypertension, Cholesterol, and Diabetes.  Discusses basics of heart failure: signs/symptoms and treatments.  Introduces Heart Failure Zone chart for action plan for heart failure.  Written material given at graduation. Flowsheet Row Pulmonary Rehab from 07/30/2022 in [facility] Cardiac and Pulmonary Rehab  Education need identified 07/30/22       Core Components/Risk Factors/Patient Goals Review:    Core Components/Risk Factors/Patient Goals at Discharge (Final Review):    ITP Comments:  ITP Comments     Row Name 07/28/22 1308 07/30/22 1108 08/03/22 1539 08/05/22 0831     ITP Comments Initial orientation completed. Diagnosis can be found in [facility] 2/28. EP Orientation scheduled for Thursday 3/14 at 9:30am. Completed 6MWT and gym orientation. Initial ITP created and sent for review to Dr. Ottie Glazier, Medical Director. First full day of exercise!  Patient was oriented to gym and equipment including functions, settings, policies, and procedures.  Patient's individual exercise prescription and treatment plan were reviewed.  All starting workloads were established  based on the results of the 6 minute walk test done at initial orientation visit.  The plan for exercise progression was also introduced and progression will be customized based on patient's performance and goals. 30 Day review completed. Medical Director ITP review done, changes made as directed, and signed approval by Medical Director.     new to program             Comments:

## 2022-08-05 NOTE — Progress Notes (Signed)
Daily Session Note  Patient Details  Name: GRANTLEY STAN MRN: [id number] Date of Birth: 04-14-1971 Referring Provider:   Flowsheet Row Pulmonary Rehab from 07/30/2022 in [facility] Cardiac and Pulmonary Rehab  Referring Provider Dr. Nelva Bush, MD       Encounter Date: 08/05/2022  Check In:  Session Check In - 08/05/22 1544       Check-In   Supervising physician immediately available to respond to emergencies See telemetry face sheet for immediately available ER MD    Location [facility]-Cardiac & Pulmonary Rehab    Staff Present Renita Papa, RN BSN;Joseph Tessie Fass, Ernestina Patches, RN, [place]    Virtual Visit No    Medication changes reported     No    Fall or balance concerns reported    No    Warm-up and Cool-down Performed on first and last piece of equipment    Resistance Training Performed Yes    VAD Patient? No    PAD/SET Patient? No      Pain Assessment   Currently in Pain? No/denies                Social History   Tobacco Use  Smoking Status Former   Types: Cigars   Quit date: 08/13/2021   Years since quitting: 0.9  Smokeless Tobacco Not on file    Goals Met:  Independence with exercise equipment Exercise tolerated well No report of concerns or symptoms today Strength training completed today  Goals Unmet:  Not Applicable  Comments: Pt able to follow exercise prescription today without complaint.  Will continue to monitor for progression.    Dr. Emily Filbert is Medical Director for [place].  Dr. Ottie Glazier is Medical Director for [place] [facility].

## 2022-08-06 ENCOUNTER — Encounter: Payer: Medicaid Other | Admitting: *Deleted

## 2022-08-06 DIAGNOSIS — Z5189 Encounter for other specified aftercare: Secondary | ICD-10-CM | POA: Diagnosis not present

## 2022-08-06 DIAGNOSIS — I5042 Chronic combined systolic (congestive) and diastolic (congestive) heart failure: Secondary | ICD-10-CM | POA: Diagnosis not present

## 2022-08-06 NOTE — Progress Notes (Signed)
Daily Session Note  Patient Details  Name: SHER GILPIN MRN: [id number] Date of Birth: 11-13-1970 Referring Provider:   Flowsheet Row Pulmonary Rehab from 07/30/2022 in [facility] Cardiac and Pulmonary Rehab  Referring Provider Dr. Nelva Bush, MD       Encounter Date: 08/06/2022  Check In:  Session Check In - 08/06/22 1537       Check-In   Supervising physician immediately available to respond to emergencies See telemetry face sheet for immediately available ER MD    Location [facility]-Cardiac & Pulmonary Rehab    Staff Present Renita Papa, RN BSN;Noah Tickle, BS, Exercise Physiologist;Joseph Tessie Fass, [place]    Virtual Visit No    Medication changes reported     No    Fall or balance concerns reported    No    Warm-up and Cool-down Performed on first and last piece of equipment    Resistance Training Performed Yes    VAD Patient? No    PAD/SET Patient? No      Pain Assessment   Currently in Pain? No/denies                Social History   Tobacco Use  Smoking Status Former   Types: Cigars   Quit date: 08/13/2021   Years since quitting: 0.9  Smokeless Tobacco Not on file    Goals Met:  Independence with exercise equipment Exercise tolerated well No report of concerns or symptoms today Strength training completed today  Goals Unmet:  Not Applicable  Comments: Pt able to follow exercise prescription today without complaint.  Will continue to monitor for progression.    Dr. Emily Filbert is Medical Director for [place].  Dr. Ottie Glazier is Medical Director for [facility] Pulmonary Rehabilitation.

## 2022-08-10 ENCOUNTER — Encounter: Payer: Medicaid Other | Admitting: *Deleted

## 2022-08-10 DIAGNOSIS — I5042 Chronic combined systolic (congestive) and diastolic (congestive) heart failure: Secondary | ICD-10-CM

## 2022-08-10 DIAGNOSIS — Z5189 Encounter for other specified aftercare: Secondary | ICD-10-CM | POA: Diagnosis not present

## 2022-08-10 NOTE — Progress Notes (Signed)
Daily Session Note  Patient Details  Name: UZAY LOUDERBACK MRN: [id number] Date of Birth: [birth year] Referring Provider:   Flowsheet Row Pulmonary Rehab from 07/30/2022 in [facility] Cardiac and Pulmonary Rehab  Referring Provider Dr. Nelva Bush, MD       Encounter Date: 08/10/2022  Check In:  Session Check In - 08/10/22 1626       Check-In   Supervising physician immediately available to respond to emergencies See telemetry face sheet for immediately available ER MD    Location [facility]-Cardiac & Pulmonary Rehab    Staff Present Darlyne Russian, RN, ADN;Joseph Tessie Fass, RCP,RRT,BSRT;Noah Tickle, BS, Exercise Physiologist    Virtual Visit No    Medication changes reported     No    Fall or balance concerns reported    No    Warm-up and Cool-down Performed on first and last piece of equipment    Resistance Training Performed Yes    VAD Patient? No    PAD/SET Patient? No      Pain Assessment   Currently in Pain? No/denies                Social History   Tobacco Use  Smoking Status Former   Types: Cigars   Quit date: 08/13/2021   Years since quitting: 0.9  Smokeless Tobacco Not on file    Goals Met:  Independence with exercise equipment Exercise tolerated well No report of concerns or symptoms today Strength training completed today  Goals Unmet:  Not Applicable  Comments: Pt able to follow exercise prescription today without complaint.  Will continue to monitor for progression.    Dr. Emily Filbert is Medical Director for [place].  Dr. Ottie Glazier is Medical Director for [facility] Pulmonary Rehabilitation.

## 2022-08-11 ENCOUNTER — Encounter: Payer: Self-pay | Admitting: Family

## 2022-08-11 ENCOUNTER — Ambulatory Visit: Payer: Medicaid Other | Admitting: Family

## 2022-08-11 VITALS — BP 108/72 | HR 63 | Temp 97.9°F | Resp 16 | Ht 69.75 in | Wt 229.2 lb

## 2022-08-11 DIAGNOSIS — I10 Essential (primary) hypertension: Secondary | ICD-10-CM | POA: Diagnosis not present

## 2022-08-11 DIAGNOSIS — I5022 Chronic systolic (congestive) heart failure: Secondary | ICD-10-CM

## 2022-08-11 DIAGNOSIS — I2510 Atherosclerotic heart disease of native coronary artery without angina pectoris: Secondary | ICD-10-CM | POA: Diagnosis not present

## 2022-08-11 NOTE — Progress Notes (Signed)
Provider: Richarda Blade [name]-C  Amore Grater, Donalee Citrin, NP  Patient Care Team: Josephine Wooldridge, Donalee Citrin, NP as PCP - General (Family Medicine) End, Cristal Deer, MD as PCP - Cardiology (Cardiology) Laurey Morale, MD as PCP - Advanced Heart Failure (Cardiology)  Extended Emergency Contact Information Primary Emergency Contact: Cournoyer,Yvonne Home Phone: [phone] Relation: Mother  Code Status: Full Code  Goals of care: Advanced Directive information    07/28/2022    2:14 PM  Advanced Directives  Does Patient Have a Medical Advance Directive? No  Would patient like information on creating a medical advance directive? No - Patient declined     Chief Complaint  Patient presents with   ER follow up    Mouth seems to be better. Had a bout of chest pain this past Sunday and he had to use Nitro for the first time. BP was normal x 3. States that he has been dizzy a little and having some nausea with it so he has been using zofran    HPI:  Pt is a 52 y.o. male seen today for an acute visit for Transition of care for ED follow up.He presented to ED on 08/02/2022 for dental pain /mouth swelling. B/p was 184/104 rechecked was 130/91 States had chest pain on Sunday took nitro and pain resolved after 10 - 15 minutes.  He denies any acute issues this visit.  States has upcoming follow up with Cardiac rehab. He brought in his home B/p readings runs in the 130's/50's - 140's/60's with few readings in the 150's/60's his weight runs between 228 lbs - 229 lbs.He denies any headache,dizziness,vision changes,fatigue,chest tightness,palpitation,chest pain or shortness of breath.      Past Medical History:  Diagnosis Date   Ascending Aortic Dilation    a. 09/2021 Echo: Asc Ao 24mm.   CAD (coronary artery disease)    a. 07/2021 Cath: LM nl, LAD 57m, D2 40, LCX 65p/m, OM1 90, RCA 127m w/ L-.R collats to RPDA from septal 1/2-->Med rx.   Chronic HFrEF (heart failure with reduced ejection fraction) (HCC)     a. 07/2021 Echo: EF 30-35%, glob HK, mod LVH, GrII DD, nl RV fxn, mildly dil LA, mild-mod MR; b. 09/2021 Echo: EF 35-40%, glob HK, mod LVH, GrI DD, nl RV fxn, mildly dil RA, mild MR, Asc Ao 64mm.   CKD (chronic kidney disease) stage 2, GFR 60-89 ml/min    Crohn's disease (HCC)    Hypertension    Mixed Ischemic & Nonischemic cardiomyopathy    a. 07/2021 Echo: EF 30-35%; b. 07/2021 Cath: Occluded RCA w/ mod LAD/LCX dzs, and severe OM1 dzs-->Med Rx; c. 09/2021 Echo: EF 35-40%.   PAH (pulmonary artery hypertension) (HCC)    a. 07/2021 RHC: PA 67/30 (42).   Proteinuria    Past Surgical History:  Procedure Laterality Date   COLECTOMY     RIGHT/LEFT HEART CATH AND CORONARY ANGIOGRAPHY N/A 08/07/2021   Procedure: RIGHT/LEFT HEART CATH AND CORONARY ANGIOGRAPHY;  Surgeon: Yvonne Kendall, MD;  Location: [facility] INVASIVE CV LAB;  Service: Cardiovascular;  Laterality: N/A;    Allergies  Allergen Reactions   Ibuprofen Hives and Other (See Comments)    Other reaction(s): GI Upset (intolerance) Hives Tolerates Advil  Other reaction(s): Not available   Spironolactone     Hyperkalemia   Sulfacetamide Other (See Comments)   Neomycin-Bacitracin Zn-Polymyx     Other reaction(s): Other (See Comments)    Outpatient Encounter Medications as of 08/11/2022  Medication Sig   acetaminophen (TYLENOL) 325 MG  tablet Take 650 mg by mouth at bedtime.   aspirin EC 81 MG tablet Take 1 tablet (81 mg total) by mouth daily. Swallow whole.   atorvastatin (LIPITOR) 80 MG tablet Take 1 tablet (80 mg total) by mouth every evening.   carvedilol (COREG) 12.5 MG tablet Take 1&1/2 tablets (18.75 mg total) by mouth 2 (two) times daily with a meal.   clopidogrel (PLAVIX) 75 MG tablet Take 1 tablet (75 mg total) by mouth daily.   empagliflozin (JARDIANCE) 10 MG TABS tablet Take 10 mg by mouth daily.   ezetimibe (ZETIA) 10 MG tablet Take 1 tablet (10 mg total) by mouth daily.   famotidine (PEPCID) 10 MG tablet Take 10 mg by mouth at  bedtime. As needed   fenofibrate (TRICOR) 48 MG tablet Take 1 tablet (48 mg total) by mouth daily.   furosemide (LASIX) 20 MG tablet Take 20 mg by mouth daily as needed. For weight gain of 3 lb in 24 hours or 5 lbs in one week   nitroGLYCERIN (NITROSTAT) 0.4 MG SL tablet Place 1 tablet (0.4 mg total) under the tongue every 5 (five) minutes as needed for chest pain.   sacubitril-valsartan (ENTRESTO) 97-103 MG Take 1 tablet by mouth 2 (two) times daily.   spironolactone (ALDACTONE) 25 MG tablet Take 1 tablet (25 mg total) by mouth daily.   [DISCONTINUED] amoxicillin (AMOXIL) 875 MG tablet Take 1 tablet (875 mg total) by mouth 2 (two) times daily.   No facility-administered encounter medications on file as of 08/11/2022.    Review of Systems  Constitutional:  Negative for appetite change, chills, fatigue, fever and unexpected weight change.  HENT:  Negative for congestion, dental problem, ear discharge, ear pain, facial swelling, hearing loss, nosebleeds, postnasal drip, rhinorrhea, sinus pressure, sinus pain, sneezing, sore throat, tinnitus and trouble swallowing.   Eyes:  Negative for pain, discharge, redness, itching and visual disturbance.  Respiratory:  Negative for cough, chest tightness, shortness of breath and wheezing.   Cardiovascular:  Negative for chest pain, palpitations and leg swelling.  Gastrointestinal:  Negative for abdominal distention, abdominal pain, blood in stool, constipation, diarrhea, nausea and vomiting.       Colstomy  Endocrine: Negative for cold intolerance, heat intolerance, polydipsia, polyphagia and polyuria.  Genitourinary:  Negative for difficulty urinating, dysuria, flank pain, frequency and urgency.  Musculoskeletal:  Negative for arthralgias, back pain, gait problem, joint swelling, myalgias, neck pain and neck stiffness.  Skin:  Negative for color change, pallor, rash and wound.  Neurological:  Negative for dizziness, syncope, speech difficulty, weakness,  light-headedness, numbness and headaches.  Hematological:  Does not bruise/bleed easily.  Psychiatric/Behavioral:  Negative for agitation, behavioral problems, confusion, hallucinations, self-injury, sleep disturbance and suicidal ideas. The patient is not nervous/anxious.     Immunization History  Administered Date(s) Administered   Moderna SARS-COV2 Booster Vaccination 04/21/2020, 01/07/2021   Tdap 05/18/2013   Zoster Recombinat (Shingrix) 01/07/2021, 07/29/2022   Pertinent  Health Maintenance Due  Topic Date Due   INFLUENZA VACCINE  08/16/2022 (Originally 12/16/2021)      10/23/2021    8:00 AM 10/29/2021   10:41 AM 01/11/2022   12:56 AM 07/15/2022    8:39 AM 07/28/2022    2:14 PM  Fall Risk  Falls in the past year?  0  0 0  Was there an injury with Fall?  0  0 0  Fall Risk Category Calculator  0  0 0  Fall Risk Category (Retired)  Low     (  RETIRED) Patient Fall Risk Level Low fall risk Low fall risk Low fall risk    Patient at Risk for Falls Due to    No Fall Risks   Fall risk Follow up  Falls evaluation completed  Falls evaluation completed    Functional Status Survey:    Vitals:   08/11/22 1106  BP: 108/72  Pulse: 63  Resp: 16  Temp: 97.9 F (36.6 C)  TempSrc: Temporal  SpO2: 98%  Weight: 229 lb 3.2 oz (104 kg)  Height: 5' 9.75" (1.772 m)   Body mass index is 33.12 kg/m. Physical Exam Vitals reviewed.  Constitutional:      General: He is not in acute distress.    Appearance: Normal appearance. He is obese. He is not ill-appearing or diaphoretic.  HENT:     Head: Normocephalic.     Right Ear: Tympanic membrane, ear canal and external ear normal. There is no impacted cerumen.     Left Ear: Tympanic membrane, ear canal and external ear normal. There is no impacted cerumen.     Nose: Nose normal. No congestion or rhinorrhea.     Mouth/Throat:     Mouth: Mucous membranes are moist.     Pharynx: Oropharynx is clear. No oropharyngeal exudate or posterior  oropharyngeal erythema.  Eyes:     General: No scleral icterus.       Right eye: No discharge.        Left eye: No discharge.     Extraocular Movements: Extraocular movements intact.     Conjunctiva/sclera: Conjunctivae normal.     Pupils: Pupils are equal, round, and reactive to light.  Neck:     Vascular: No carotid bruit.  Cardiovascular:     Rate and Rhythm: Normal rate and regular rhythm.     Pulses: Normal pulses.     Heart sounds: Normal heart sounds. No murmur heard.    No friction rub. No gallop.  Pulmonary:     Effort: Pulmonary effort is normal. No respiratory distress.     Breath sounds: Normal breath sounds. No wheezing, rhonchi or rales.  Chest:     Chest wall: No tenderness.  Abdominal:     General: Bowel sounds are normal. There is no distension.     Palpations: Abdomen is soft. There is no mass.     Tenderness: There is no abdominal tenderness. There is no right CVA tenderness, left CVA tenderness, guarding or rebound.     Comments: Colostomy bag patent  Musculoskeletal:        General: No swelling or tenderness. Normal range of motion.     Cervical back: Normal range of motion. No rigidity or tenderness.     Right lower leg: No edema.     Left lower leg: No edema.  Lymphadenopathy:     Cervical: No cervical adenopathy.  Skin:    General: Skin is warm and dry.     Coloration: Skin is not pale.     Findings: No bruising, erythema, lesion or rash.  Neurological:     Mental Status: He is alert and oriented to person, place, and time.     Cranial Nerves: No cranial nerve deficit.     Sensory: No sensory deficit.     Motor: No weakness.     Coordination: Coordination normal.     Gait: Gait normal.  Psychiatric:        Mood and Affect: Mood normal.        Speech: Speech normal.  Behavior: Behavior normal.        Thought Content: Thought content normal.        Judgment: Judgment normal.     Labs reviewed: Recent Labs    09/10/21 0444  09/11/21 0602 09/14/21 2104 10/11/21 0510 10/14/21 1057 10/20/21 2317 10/21/21 0850 06/17/22 1412 06/26/22 1502 07/15/22 1425  NA 139  --    < > 140   < >  --    < > 139 138 143  K 4.2  --    < > 4.7   < >  --    < > 3.7 4.1 4.7  CL 105  --    < > 108   < >  --    < > 103 103 107  CO2 25  --    < > 26   < >  --    < > 27 29 25   GLUCOSE 97  --    < > 100*   < >  --    < > 134* 109* 103*  BUN 29*  --    < > 28*   < >  --    < > 19 20 17   CREATININE 1.43* 1.32*   < > 1.38*   < >  --    < > 1.36* 1.45* 1.37*  CALCIUM 9.5  --    < > 9.5   < >  --    < > 9.4 9.2 9.9  MG 2.0 2.0  --   --   --  2.0  --   --   --   --   PHOS  --   --   --  4.0  --   --   --   --   --   --    < > = values in this interval not displayed.   Recent Labs    09/09/21 0329 09/14/21 2104 10/11/21 0510 01/27/22 1400 07/15/22 1425  AST 41 44*  --  25 26  ALT 53* 59*  --  35 42  ALKPHOS 112 86  --  87  --   BILITOT 0.6 1.0  --  1.3* 0.6  PROT 7.6 6.6  --  6.7 7.0  ALBUMIN 3.8 3.6 3.7 3.6  --    Recent Labs    09/14/21 2104 09/15/21 0326 10/14/21 1057 10/20/21 1943 10/21/21 0545 12/09/21 1435 07/15/22 1425  WBC 7.1   < > 6.0   < > 8.0 8.3 7.3  NEUTROABS 5.1  --  3.8  --   --   --  4,636  HGB 14.3   < > 13.2   < > 12.0* 15.5 15.5  HCT 42.7   < > 39.3   < > 34.9* 46.2 46.1  MCV 85.2   < > 83.8   < > 82.1 85.4 83.1  PLT 189   < > 176   < > 159 179 237   < > = values in this interval not displayed.   Lab Results  Component Value Date   TSH 0.86 07/15/2022   Lab Results  Component Value Date   HGBA1C 5.4 08/05/2021   Lab Results  Component Value Date   CHOL 147 07/15/2022   HDL 39 (L) 07/15/2022   LDLCALC 81 07/15/2022   LDLDIRECT 69.0 09/09/2021   TRIG 177 (H) 07/15/2022   CHOLHDL 3.8 07/15/2022    Significant Diagnostic Results in last 30 days:  No results found.  Assessment/Plan 1. Essential hypertension -Blood pressure well-controlled -Continue current medication -Continue to  monitor blood pressure at home -Follow-up with a cardiologist as scheduled  2. Chronic HFrEF (heart failure with reduced ejection fraction) (HCC) -No symptoms of fluid overload - Keep legs elevated when seated to keep swelling down  - check weight at least three times per week and notify provider for any abrupt weight gain > 3 lbs  - Reduce salt intake in diet  -Continue on furosemide and Aldactone  3. Coronary artery disease involving native coronary artery of native heart, unspecified whether angina present Had chest pain few days ago took a nitro x 1 dose and symptoms resolved within 10 to 15 minutes.  No other chest pain since then. -Continue to monitor -Continue on nitro, aspirin, Plavix, fenofibrate and atorvastatin  Family/ staff Communication: Reviewed plan of care with patient verbalized understanding  Labs/tests ordered: None   Next Appointment: Return if symptoms worsen or fail to improve.   Caesar Bookmaninah C Perpetua Elling, NP

## 2022-08-12 ENCOUNTER — Encounter: Payer: Medicaid Other | Admitting: *Deleted

## 2022-08-12 DIAGNOSIS — I5042 Chronic combined systolic (congestive) and diastolic (congestive) heart failure: Secondary | ICD-10-CM | POA: Diagnosis not present

## 2022-08-12 DIAGNOSIS — Z5189 Encounter for other specified aftercare: Secondary | ICD-10-CM | POA: Diagnosis not present

## 2022-08-12 NOTE — Progress Notes (Signed)
Daily Session Note  Patient Details  Name: ADWIN CARRAHER MRN: [id number] Date of Birth: [birth year] Referring Provider:   Flowsheet Row Pulmonary Rehab from 07/30/2022 in [facility] Cardiac and Pulmonary Rehab  Referring Provider Dr. Nelva Bush, MD       Encounter Date: 08/12/2022  Check In:  Session Check In - 08/12/22 1544       Check-In   Supervising physician immediately available to respond to emergencies See telemetry face sheet for immediately available ER MD    Location [facility]-Cardiac & Pulmonary Rehab    Staff Present Renita Papa, RN BSN;Joseph Tessie Fass, Ernestina Patches, RN, [place]    Virtual Visit No    Medication changes reported     No    Fall or balance concerns reported    No    Warm-up and Cool-down Performed on first and last piece of equipment    Resistance Training Performed Yes    VAD Patient? No    PAD/SET Patient? No      Pain Assessment   Currently in Pain? No/denies                Social History   Tobacco Use  Smoking Status Former   Types: Cigars   Quit date: 08/13/2021   Years since quitting: 0.9  Smokeless Tobacco Not on file    Goals Met:  Independence with exercise equipment Exercise tolerated well No report of concerns or symptoms today Strength training completed today  Goals Unmet:  Not Applicable  Comments: Pt able to follow exercise prescription today without complaint.  Will continue to monitor for progression.    Dr. Emily Filbert is Medical Director for [place].  Dr. Ottie Glazier is Medical Director for [facility] Pulmonary Rehabilitation.

## 2022-08-13 ENCOUNTER — Encounter: Payer: Medicaid Other | Admitting: *Deleted

## 2022-08-13 DIAGNOSIS — I5042 Chronic combined systolic (congestive) and diastolic (congestive) heart failure: Secondary | ICD-10-CM

## 2022-08-13 DIAGNOSIS — Z5189 Encounter for other specified aftercare: Secondary | ICD-10-CM | POA: Diagnosis not present

## 2022-08-13 NOTE — Progress Notes (Signed)
Daily Session Note  Patient Details  Name: Clarence [name] MRN: [id number] Date of Birth: 1971/01/12 Referring Provider:   Flowsheet Row Pulmonary Rehab from 07/30/2022 in [facility] Cardiac and Pulmonary Rehab  Referring Provider Dr. Nelva Bush, MD       Encounter Date: 08/13/2022  Check In:  Session Check In - 08/13/22 1533       Check-In   Supervising physician immediately available to respond to emergencies See telemetry face sheet for immediately available ER MD    Location [facility]-Cardiac & Pulmonary Rehab    Staff Present Renita Papa, RN BSN;Joseph Tessie Fass, RCP,RRT,BSRT;Noah [place]ong, [place], Exercise Physiologist    Virtual Visit No    Medication changes reported     No    Fall or balance concerns reported    No    Warm-up and Cool-down Performed on first and last piece of equipment    Resistance Training Performed Yes    VAD Patient? No    PAD/SET Patient? No      Pain Assessment   Currently in Pain? No/denies                Social History   Tobacco Use  Smoking Status Former   Types: Cigars   Quit date: 08/13/2021   Years since quitting: 1.0  Smokeless Tobacco Not on file    Goals Met:  Proper associated with RPD/PD & O2 Sat Exercise tolerated well No report of concerns or symptoms today Strength training completed today  Goals Unmet:  Not Applicable  Comments: Pt able to follow exercise prescription today without complaint.  Will continue to monitor for progression.    Dr. Emily Filbert is Medical Director for [place].  Dr. Ottie Glazier is Medical Director for [facility] Pulmonary Rehabilitation.

## 2022-08-17 ENCOUNTER — Encounter: Payer: Medicaid Other | Attending: Internal Medicine | Admitting: *Deleted

## 2022-08-17 DIAGNOSIS — I5042 Chronic combined systolic (congestive) and diastolic (congestive) heart failure: Secondary | ICD-10-CM | POA: Insufficient documentation

## 2022-08-17 NOTE — Progress Notes (Signed)
Daily Session Note  Patient Details  Name: Clarence [name] MRN: [id number] Date of Birth: 11-21-1970 Referring Provider:   Flowsheet Row Pulmonary Rehab from 07/30/2022 in [facility] Cardiac and Pulmonary Rehab  Referring Provider Dr. Nelva Bush, MD       Encounter Date: 08/17/2022  Check In:  Session Check In - 08/17/22 1552       Check-In   Supervising physician immediately available to respond to emergencies See telemetry face sheet for immediately available ER MD    Location [facility]-Cardiac & Pulmonary Rehab    Staff Present Renita Papa, RN BSN;Joseph Tessie Fass, RCP,RRT,BSRT;Noah [place], [place], Exercise Physiologist    Virtual Visit No    Medication changes reported     No    Fall or balance concerns reported    No    Warm-up and Cool-down Performed on first and last piece of equipment    Resistance Training Performed Yes    VAD Patient? No    PAD/SET Patient? No      Pain Assessment   Currently in Pain? No/denies                Social History   Tobacco Use  Smoking Status Former   Types: Cigars   Quit date: 08/13/2021   Years since quitting: 1.0  Smokeless Tobacco Not on file    Goals Met:  Independence with exercise equipment Exercise tolerated well No report of concerns or symptoms today Strength training completed today  Goals Unmet:  Not Applicable  Comments: Pt able to follow exercise prescription today without complaint.  Will continue to monitor for progression.    Dr. Emily Filbert is Medical Director for [place].  Dr. Ottie Glazier is Medical Director for [facility] Pulmonary Rehabilitation.

## 2022-08-19 ENCOUNTER — Encounter: Payer: Medicaid Other | Admitting: *Deleted

## 2022-08-19 DIAGNOSIS — I5042 Chronic combined systolic (congestive) and diastolic (congestive) heart failure: Secondary | ICD-10-CM

## 2022-08-19 NOTE — Progress Notes (Signed)
Daily Session Note  Patient Details  Name: Clarence [name] MRN: [id number] Date of Birth: Oct 12, 1970 Referring Provider:   Flowsheet Row Pulmonary Rehab from 07/30/2022 in [facility] Cardiac and Pulmonary Rehab  Referring Provider Dr. Nelva Bush, MD       Encounter Date: 08/19/2022  Check In:  Session Check In - 08/19/22 1541       Check-In   Supervising physician immediately available to respond to emergencies See telemetry face sheet for immediately available ER MD    Location [facility]-Cardiac & Pulmonary Rehab    Staff Present Renita Papa, RN BSN;Laureen Owens Shark, BS, RRT, CPFT;Joseph [place], [place]    Virtual Visit No    Medication changes reported     No    Fall or balance concerns reported    No    Warm-up and Cool-down Performed on first and last piece of equipment    Resistance Training Performed Yes    VAD Patient? No    PAD/SET Patient? No      Pain Assessment   Currently in Pain? No/denies                Social History   Tobacco Use  Smoking Status Former   Types: Cigars   Quit date: 08/13/2021   Years since quitting: 1.0  Smokeless Tobacco Not on file    Goals Met:  Independence with exercise equipment Exercise tolerated well No report of concerns or symptoms today Strength training completed today  Goals Unmet:  Not Applicable  Comments: Pt able to follow exercise prescription today without complaint.  Will continue to monitor for progression.    Dr. Emily Filbert is Medical Director for [place].  Dr. Ottie Glazier is Medical Director for [facility] Pulmonary Rehabilitation.

## 2022-08-20 ENCOUNTER — Encounter: Payer: Medicaid Other | Admitting: *Deleted

## 2022-08-20 DIAGNOSIS — I5042 Chronic combined systolic (congestive) and diastolic (congestive) heart failure: Secondary | ICD-10-CM | POA: Diagnosis not present

## 2022-08-20 NOTE — Progress Notes (Signed)
Daily Session Note  Patient Details  Name: ZAVIYAR VIRELLA MRN: [id number] Date of Birth: December 28, 1970 Referring Provider:   Flowsheet Row Pulmonary Rehab from 07/30/2022 in [facility] Cardiac and Pulmonary Rehab  Referring Provider Dr. Nelva Bush, MD       Encounter Date: 08/20/2022  Check In:  Session Check In - 08/20/22 1549       Check-In   Supervising physician immediately available to respond to emergencies See telemetry face sheet for immediately available ER MD    Location [facility]-Cardiac & Pulmonary Rehab    Staff Present Darlyne Russian, RN, ADN;Laureen Owens Shark, BS, RRT, CPFT;Joseph Tessie Fass, [place]    Virtual Visit No    Medication changes reported     No    Fall or balance concerns reported    No    Warm-up and Cool-down Performed on first and last piece of equipment    Resistance Training Performed Yes    VAD Patient? No    PAD/SET Patient? No      Pain Assessment   Currently in Pain? No/denies                Social History   Tobacco Use  Smoking Status Former   Types: Cigars   Quit date: 08/13/2021   Years since quitting: 1.0  Smokeless Tobacco Not on file    Goals Met:  Independence with exercise equipment Exercise tolerated well No report of concerns or symptoms today Strength training completed today  Goals Unmet:  Not Applicable  Comments: Pt able to follow exercise prescription today without complaint.  Will continue to monitor for progression.    Dr. Emily Filbert is Medical Director for [place].  Dr. Ottie Glazier is Medical Director for [facility] Pulmonary Rehabilitation.

## 2022-08-21 ENCOUNTER — Other Ambulatory Visit (HOSPITAL_COMMUNITY): Payer: Self-pay

## 2022-08-24 ENCOUNTER — Encounter: Payer: Medicaid Other | Admitting: *Deleted

## 2022-08-24 DIAGNOSIS — I5042 Chronic combined systolic (congestive) and diastolic (congestive) heart failure: Secondary | ICD-10-CM

## 2022-08-24 NOTE — Progress Notes (Signed)
Daily Session Note  Patient Details  Name: Clarence [name] MRN: [id number] Date of Birth: 01-12-1971 Referring Provider:   Flowsheet Row Pulmonary Rehab from 07/30/2022 in [facility] Cardiac and Pulmonary Rehab  Referring Provider Dr. Yvonne Kendall, MD       Encounter Date: 08/24/2022  Check In:  Session Check In - 08/24/22 1533       Check-In   Supervising physician immediately available to respond to emergencies See telemetry face sheet for immediately available ER MD    Location [facility]-Cardiac & Pulmonary Rehab    Staff Present Susann Givens, RN BSN;Joseph Reino Kent, RCP,RRT,BSRT;Noah [place], [place], Exercise Physiologist    Virtual Visit No    Medication changes reported     No    Fall or balance concerns reported    No    Warm-up and Cool-down Performed on first and last piece of equipment    Resistance Training Performed Yes    VAD Patient? No    PAD/SET Patient? No      Pain Assessment   Currently in Pain? No/denies                Social History   Tobacco Use  Smoking Status Former   Types: Cigars   Quit date: 08/13/2021   Years since quitting: 1.0  Smokeless Tobacco Not on file    Goals Met:  Independence with exercise equipment Exercise tolerated well No report of concerns or symptoms today Strength training completed today  Goals Unmet:  Not Applicable  Comments: Pt able to follow exercise prescription today without complaint.  Will continue to monitor for progression.    Dr. Bethann Punches is Medical Director for [facility] Cardiac Rehabilitation.  Dr. Vida Rigger is Medical Director for [facility] Pulmonary Rehabilitation.

## 2022-08-26 ENCOUNTER — Encounter: Payer: Medicaid Other | Admitting: *Deleted

## 2022-08-26 DIAGNOSIS — I5042 Chronic combined systolic (congestive) and diastolic (congestive) heart failure: Secondary | ICD-10-CM

## 2022-08-26 NOTE — Progress Notes (Signed)
Daily Session Note  Patient Details  Name: Clarence [name] MRN: [id number] Date of Birth: 12-27-1970 Referring Provider:   Flowsheet Row Pulmonary Rehab from 07/30/2022 in [facility] Cardiac and Pulmonary Rehab  Referring Provider Dr. Yvonne Kendall, MD       Encounter Date: 08/26/2022  Check In:  Session Check In - 08/26/22 1538       Check-In   Supervising physician immediately available to respond to emergencies See telemetry face sheet for immediately available ER MD    Location [facility]-Cardiac & Pulmonary Rehab    Staff Present Susann Givens, RN Mabeline Caras, BS, ACSM CEP, Exercise Physiologist;Megan Katrinka Blazing, RN, ADN    Virtual Visit No    Medication changes reported     No    Fall or balance concerns reported    No    Warm-up and Cool-down Performed on first and last piece of equipment    Resistance Training Performed Yes    VAD Patient? No    PAD/SET Patient? No      Pain Assessment   Currently in Pain? No/denies                Social History   Tobacco Use  Smoking Status Former   Types: Cigars   Quit date: 08/13/2021   Years since quitting: 1.0  Smokeless Tobacco Not on file    Goals Met:  Independence with exercise equipment Exercise tolerated well No report of concerns or symptoms today Strength training completed today  Goals Unmet:  Not Applicable  Comments: Pt able to follow exercise prescription today without complaint.  Will continue to monitor for progression.    Dr. Bethann Punches is Medical Director for [facility] Cardiac Rehabilitation.  Dr. Vida Rigger is Medical Director for [facility] Pulmonary Rehabilitation.

## 2022-08-27 ENCOUNTER — Encounter: Payer: Medicaid Other | Admitting: *Deleted

## 2022-08-27 DIAGNOSIS — I5042 Chronic combined systolic (congestive) and diastolic (congestive) heart failure: Secondary | ICD-10-CM | POA: Diagnosis not present

## 2022-08-27 NOTE — Progress Notes (Signed)
Daily Session Note  Patient Details  Name: ELIAN VANVALKENBURGH MRN: [id number] Date of Birth: May 21, 1970 Referring Provider:   Flowsheet Row Pulmonary Rehab from 07/30/2022 in [facility] Cardiac and Pulmonary Rehab  Referring Provider Dr. Yvonne Kendall, MD       Encounter Date: 08/27/2022  Check In:  Session Check In - 08/27/22 1531       Check-In   Supervising physician immediately available to respond to emergencies See telemetry face sheet for immediately available ER MD    Location [facility]-Cardiac & Pulmonary Rehab    Staff Present Susann Givens, RN BSN;Joseph [place], RCP,RRT,BSRT;Noah Clinton, [place], Exercise Physiologist    Virtual Visit No    Medication changes reported     No    Fall or balance concerns reported    No    Warm-up and Cool-down Performed on first and last piece of equipment    Resistance Training Performed Yes    VAD Patient? No    PAD/SET Patient? No      Pain Assessment   Currently in Pain? No/denies                Social History   Tobacco Use  Smoking Status Former   Types: Cigars   Quit date: 08/13/2021   Years since quitting: 1.0  Smokeless Tobacco Not on file    Goals Met:  Independence with exercise equipment Exercise tolerated well No report of concerns or symptoms today Strength training completed today  Goals Unmet:  Not Applicable  Comments: Pt able to follow exercise prescription today without complaint.  Will continue to monitor for progression.    Dr. Bethann Punches is Medical Director for [facility] Cardiac Rehabilitation.  Dr. Vida Rigger is Medical Director for [facility] Pulmonary Rehabilitation.

## 2022-08-31 ENCOUNTER — Encounter: Payer: Medicaid Other | Admitting: *Deleted

## 2022-08-31 DIAGNOSIS — I5042 Chronic combined systolic (congestive) and diastolic (congestive) heart failure: Secondary | ICD-10-CM | POA: Diagnosis not present

## 2022-08-31 NOTE — Progress Notes (Signed)
Daily Session Note  Patient Details  Name: Clarence [name] MRN: [id number] Date of Birth: February 15, 1971 Referring Provider:   Flowsheet Row Pulmonary Rehab from 07/30/2022 in [facility] Cardiac and Pulmonary Rehab  Referring Provider Dr. Yvonne Kendall, MD       Encounter Date: 08/31/2022  Check In:  Session Check In - 08/31/22 1558       Check-In   Supervising physician immediately available to respond to emergencies See telemetry face sheet for immediately available ER MD    Location [facility]-Cardiac & Pulmonary Rehab    Staff Present Lanny Hurst, RN, ADN;Joseph Reino Kent, RCP,RRT,BSRT;Noah Tickle, BS, Exercise Physiologist    Virtual Visit No    Medication changes reported     No    Fall or balance concerns reported    No    Warm-up and Cool-down Performed on first and last piece of equipment    Resistance Training Performed Yes    VAD Patient? No    PAD/SET Patient? No      Pain Assessment   Currently in Pain? No/denies                Social History   Tobacco Use  Smoking Status Former   Types: Cigars   Quit date: 08/13/2021   Years since quitting: 1.0  Smokeless Tobacco Not on file    Goals Met:  Independence with exercise equipment Exercise tolerated well No report of concerns or symptoms today Strength training completed today  Goals Unmet:  Not Applicable  Comments: Pt able to follow exercise prescription today without complaint.  Will continue to monitor for progression.    Dr. Bethann Punches is Medical Director for [facility] Cardiac Rehabilitation.  Dr. Vida Rigger is Medical Director for [facility] Pulmonary Rehabilitation.

## 2022-09-01 ENCOUNTER — Other Ambulatory Visit (HOSPITAL_COMMUNITY): Payer: Self-pay

## 2022-09-01 ENCOUNTER — Other Ambulatory Visit: Payer: Self-pay

## 2022-09-02 ENCOUNTER — Encounter: Payer: Self-pay | Admitting: *Deleted

## 2022-09-02 ENCOUNTER — Encounter: Payer: Medicaid Other | Admitting: *Deleted

## 2022-09-02 DIAGNOSIS — I5042 Chronic combined systolic (congestive) and diastolic (congestive) heart failure: Secondary | ICD-10-CM

## 2022-09-02 NOTE — Progress Notes (Signed)
Pulmonary Individual Treatment Plan  Patient Details  Name: Clarence [name] MRN: [id number] Date of Birth: [birth year] Referring Provider:   Flowsheet Row Pulmonary Rehab from 07/30/2022 in [facility] Cardiac and Pulmonary Rehab  Referring Provider Dr. Yvonne Kendall, MD       Initial Encounter Date:  Flowsheet Row Pulmonary Rehab from 07/30/2022 in [facility] Cardiac and Pulmonary Rehab  Date 07/30/22       Visit Diagnosis: Chronic combined systolic and diastolic congestive heart failure  Patient's Home Medications on Admission:  Current Outpatient Medications:    acetaminophen (TYLENOL) 325 MG tablet, Take 650 mg by mouth at bedtime., Disp: , Rfl:    aspirin EC 81 MG tablet, Take 1 tablet (81 mg total) by mouth daily. Swallow whole., Disp: 30 tablet, Rfl: 4   atorvastatin (LIPITOR) 80 MG tablet, Take 1 tablet (80 mg total) by mouth every evening., Disp: 30 tablet, Rfl: 4   carvedilol (COREG) 12.5 MG tablet, Take 1&1/2 tablets (18.75 mg total) by mouth 2 (two) times daily with a meal., Disp: 90 tablet, Rfl: 3   clopidogrel (PLAVIX) 75 MG tablet, Take 1 tablet (75 mg total) by mouth daily., Disp: 30 tablet, Rfl: 6   empagliflozin (JARDIANCE) 10 MG TABS tablet, Take 10 mg by mouth daily., Disp: , Rfl:    ezetimibe (ZETIA) 10 MG tablet, Take 1 tablet (10 mg total) by mouth daily., Disp: 90 tablet, Rfl: 3   famotidine (PEPCID) 10 MG tablet, Take 10 mg by mouth at bedtime. As needed, Disp: , Rfl:    fenofibrate (TRICOR) 48 MG tablet, Take 1 tablet (48 mg total) by mouth daily., Disp: 30 tablet, Rfl: 8   furosemide (LASIX) 20 MG tablet, Take 20 mg by mouth daily as needed. For weight gain of 3 lb in 24 hours or 5 lbs in one week, Disp: , Rfl:    nitroGLYCERIN (NITROSTAT) 0.4 MG SL tablet, Place 1 tablet (0.4 mg total) under the tongue every 5 (five) minutes as needed for chest pain., Disp: 25 tablet, Rfl: 3   sacubitril-valsartan (ENTRESTO) 97-103 MG, Take 1 tablet by mouth 2 (two) times daily.,  Disp: 180 tablet, Rfl: 3   spironolactone (ALDACTONE) 25 MG tablet, Take 1 tablet (25 mg total) by mouth daily., Disp: 90 tablet, Rfl: 1  Past Medical History: Past Medical History:  Diagnosis Date   Ascending Aortic Dilation    a. 09/2021 Echo: Asc Ao 40mm.   CAD (coronary artery disease)    a. 07/2021 Cath: LM nl, LAD 75m, D2 40, LCX 65p/m, OM1 90, RCA 153m w/ L-.R collats to RPDA from septal 1/2-->Med rx.   Chronic HFrEF (heart failure with reduced ejection fraction) (HCC)    a. 07/2021 Echo: EF 30-35%, glob HK, mod LVH, GrII DD, nl RV fxn, mildly dil LA, mild-mod MR; b. 09/2021 Echo: EF 35-40%, glob HK, mod LVH, GrI DD, nl RV fxn, mildly dil RA, mild MR, Asc Ao 40mm.   CKD (chronic kidney disease) stage 2, GFR 60-89 ml/min    Crohn's disease (HCC)    Hypertension    Mixed Ischemic & Nonischemic cardiomyopathy    a. 07/2021 Echo: EF 30-35%; b. 07/2021 Cath: Occluded RCA w/ mod LAD/LCX dzs, and severe OM1 dzs-->Med Rx; c. 09/2021 Echo: EF 35-40%.   PAH (pulmonary artery hypertension) (HCC)    a. 07/2021 RHC: PA 67/30 (42).   Proteinuria     Tobacco Use: Social History   Tobacco Use  Smoking Status Former   Types: Cigars  Quit date: 08/13/2021   Years since quitting: 1.0  Smokeless Tobacco Not on file    Labs: Review Flowsheet  More data exists      Latest Ref Rng & Units 09/09/2021 09/10/2021 10/07/2021 01/27/2022 07/15/2022  Labs for ITP Cardiac and Pulmonary Rehab  Cholestrol <200 mg/dL 734  C 037  096  438  381   LDL (calc) mg/dL (calc) UNABLE TO CALCULATE IF TRIGLYCERIDE OVER 400 mg/dL  C 50  43  70  81   Direct LDL 0 - 99 mg/dL 84.0  - - - -  HDL-C > OR = 40 mg/dL 35  C 34  30  35  39   Trlycerides <150 mg/dL 375  C 436  067  703  403     Details      C Corrected result          Pulmonary Assessment Scores:  Pulmonary Assessment Scores     Row Name 07/28/22 1459 07/30/22 1109       ADL UCSD   ADL Phase Entry Entry    SOB Score total 54 54    Rest 5 5    Walk  3 3    Stairs 3 3    Bath 3 3    Dress 2 2    Shop 3 3      CAT Score   CAT Score 22 22      mMRC Score   mMRC Score -- 2             UCSD: Self-administered rating of dyspnea associated with activities of daily living (ADLs) 6-point scale (0 = "not at all" to 5 = "maximal or unable to do because of breathlessness")  Scoring Scores range from 0 to 120.  Minimally important difference is 5 units  CAT: CAT can identify the health impairment of COPD patients and is better correlated with disease progression.  CAT has a scoring range of zero to 40. The CAT score is classified into four groups of low (less than 10), medium (10 - 20), high (21-30) and very high (31-40) based on the impact level of disease on health status. A CAT score over 10 suggests significant symptoms.  A worsening CAT score could be explained by an exacerbation, poor medication adherence, poor inhaler technique, or progression of COPD or comorbid conditions.  CAT MCID is 2 points  mMRC: mMRC (Modified Medical Research Council) Dyspnea Scale is used to assess the degree of baseline functional disability in patients of respiratory disease due to dyspnea. No minimal important difference is established. A decrease in score of 1 point or greater is considered a positive change.   Pulmonary Function Assessment:   Exercise Target Goals: Exercise Program Goal: Individual exercise prescription set using results from initial 6 min walk test and THRR while considering  patient's activity barriers and safety.   Exercise Prescription Goal: Initial exercise prescription builds to 30-45 minutes a day of aerobic activity, 2-3 days per week.  Home exercise guidelines will be given to patient during program as part of exercise prescription that the participant will acknowledge.  Education: Aerobic Exercise: - Group verbal and visual presentation on the components of exercise prescription. Introduces F.I.T.T principle from ACSM  for exercise prescriptions.  Reviews F.I.T.T. principles of aerobic exercise including progression. Written material given at graduation. Flowsheet Row Pulmonary Rehab from 08/26/2022 in [facility] Cardiac and Pulmonary Rehab  Education need identified 07/30/22  Date 08/12/22  Educator KW  Instruction Review  Code 1- Verbalizes Understanding       Education: Resistance Exercise: - Group verbal and visual presentation on the components of exercise prescription. Introduces F.I.T.T principle from ACSM for exercise prescriptions  Reviews F.I.T.T. principles of resistance exercise including progression. Written material given at graduation.    Education: Exercise & Equipment Safety: - Individual verbal instruction and demonstration of equipment use and safety with use of the equipment. Flowsheet Row Pulmonary Rehab from 08/26/2022 in [facility] Cardiac and Pulmonary Rehab  Date 07/30/22  Educator NT  Instruction Review Code 1- Verbalizes Understanding       Education: Exercise Physiology & General Exercise Guidelines: - Group verbal and written instruction with models to review the exercise physiology of the cardiovascular system and associated critical values. Provides general exercise guidelines with specific guidelines to those with heart or lung disease.  Flowsheet Row Pulmonary Rehab from 08/26/2022 in [facility] Cardiac and Pulmonary Rehab  Date 08/05/22  Educator KW  Instruction Review Code 1- [employer] Understanding       Education: Flexibility, Balance, Mind/Body Relaxation: - Group verbal and visual presentation with interactive activity on the components of exercise prescription. Introduces F.I.T.T principle from ACSM for exercise prescriptions. Reviews F.I.T.T. principles of flexibility and balance exercise training including progression. Also discusses the mind body connection.  Reviews various relaxation techniques to help reduce and manage stress (i.e. Deep breathing, progressive muscle  relaxation, and visualization). Balance handout provided to take home. Written material given at graduation.   Activity Barriers & Risk Stratification:  Activity Barriers & Cardiac Risk Stratification - 07/30/22 1111       Activity Barriers & Cardiac Risk Stratification   Activity Barriers Joint Problems;Deconditioning;Shortness of Breath   left shoulder pain            6 Minute Walk:  6 Minute Walk     Row Name 07/30/22 1343         6 Minute Walk   Phase Initial     Distance 940 feet     Walk Time 6 minutes     # of Rest Breaks 0     MPH 1.78     METS 2.78     RPE 13     Perceived Dyspnea  2     VO2 Peak 9.73     Symptoms Yes (comment)     Comments SOB     Resting HR 61 bpm     Resting BP 104/62     Resting Oxygen Saturation  94 %     Exercise Oxygen Saturation  during 6 min walk 94 %     Max Ex. HR 90 bpm     Max Ex. BP 124/72     2 Minute Post BP 100/64       Interval HR   1 Minute HR 83     2 Minute HR 88     3 Minute HR 86     4 Minute HR 86     5 Minute HR 90     6 Minute HR 84     2 Minute Post HR 61     Interval Heart Rate? Yes       Interval Oxygen   Interval Oxygen? Yes     Baseline Oxygen Saturation % 94 %     1 Minute Oxygen Saturation % 94 %     1 Minute Liters of Oxygen 0 L  RA     2 Minute Liters of Oxygen 0 L  RA     3 Minute Oxygen Saturation % 94 %     3 Minute Liters of Oxygen 0 L  RA     4 Minute Oxygen Saturation % 94 %     4 Minute Liters of Oxygen 0 L  RA     5 Minute Oxygen Saturation % 94 %     5 Minute Liters of Oxygen 0 L  RA     6 Minute Oxygen Saturation % 94 %     6 Minute Liters of Oxygen 0 L  RA     2 Minute Post Oxygen Saturation % 96 %     2 Minute Post Liters of Oxygen 0 L  RA             Oxygen Initial Assessment:  Oxygen Initial Assessment - 07/28/22 1416       Home Oxygen   Home Oxygen Device None    Sleep Oxygen Prescription None    Home Exercise Oxygen Prescription None    Home Resting Oxygen  Prescription None    Compliance with Home Oxygen Use Yes      Intervention   Short Term Goals To learn and understand importance of maintaining oxygen saturations>88%;To learn and demonstrate proper pursed lip breathing techniques or other breathing techniques. ;To learn and understand importance of monitoring SPO2 with pulse oximeter and demonstrate accurate use of the pulse oximeter.    Long  Term Goals Maintenance of O2 saturations>88%;Compliance with respiratory medication;Verbalizes importance of monitoring SPO2 with pulse oximeter and return demonstration;Exhibits proper breathing techniques, such as pursed lip breathing or other method taught during program session             Oxygen Re-Evaluation:  Oxygen Re-Evaluation     Row Name 08/03/22 1540 08/17/22 1559           Program Oxygen Prescription   Program Oxygen Prescription -- None        Home Oxygen   Home Oxygen Device -- None      Sleep Oxygen Prescription -- None      Home Exercise Oxygen Prescription -- None      Home Resting Oxygen Prescription -- None        Goals/Expected Outcomes   Short Term Goals -- To learn and demonstrate proper pursed lip breathing techniques or other breathing techniques.       Long  Term Goals -- Exhibits proper breathing techniques, such as pursed lip breathing or other method taught during program session      Comments Reviewed PLB technique with pt.  Talked about how it works and it's importance in maintaining their exercise saturations. Informed patient how to perform the Pursed Lipped breathing technique. Told patient to Inhale through the nose and out the mouth with pursed lips to keep their airways open, help oxygenate them better, practice when at rest or doing strenuous activity. Patient Verbalizes understanding of technique and will work on and be reiterated during [facility].      Goals/Expected Outcomes Short: Become more profiecient at using PLB.   Long: Become independent at  using PLB. Short: use PLB with exertion. Long: use PLB on exertion proficiently and independently.               Oxygen Discharge (Final Oxygen Re-Evaluation):  Oxygen Re-Evaluation - 08/17/22 1559       Program Oxygen Prescription   Program Oxygen Prescription None      Home Oxygen   Home Oxygen Device  None    Sleep Oxygen Prescription None    Home Exercise Oxygen Prescription None    Home Resting Oxygen Prescription None      Goals/Expected Outcomes   Short Term Goals To learn and demonstrate proper pursed lip breathing techniques or other breathing techniques.     Long  Term Goals Exhibits proper breathing techniques, such as pursed lip breathing or other method taught during program session    Comments Informed patient how to perform the Pursed Lipped breathing technique. Told patient to Inhale through the nose and out the mouth with pursed lips to keep their airways open, help oxygenate them better, practice when at rest or doing strenuous activity. Patient Verbalizes understanding of technique and will work on and be reiterated during [facility].    Goals/Expected Outcomes Short: use PLB with exertion. Long: use PLB on exertion proficiently and independently.             Initial Exercise Prescription:  Initial Exercise Prescription - 07/30/22 1400       Date of Initial Exercise RX and Referring Provider   Date 07/30/22    Referring Provider Dr. Yvonne Kendall, MD      Oxygen   Maintain Oxygen Saturation 88% or higher      Treadmill   MPH 1.8    Grade 0.5    Minutes 15    METs 2.5      Recumbant Bike   Level 1    RPM 50    Watts 25    Minutes 15    METs 2.78      NuStep   Level 2    SPM 80    Minutes 15    METs 2.78      REL-XR   Level 2    Speed 50    Minutes 15    METs 2.78      Prescription Details   Frequency (times per week) 3    Duration Progress to 30 minutes of continuous aerobic without signs/symptoms of physical distress       Intensity   THRR 40-80% of Max Heartrate 103-145    Ratings of Perceived Exertion 11-13    Perceived Dyspnea 0-4      Progression   Progression Continue to progress workloads to maintain intensity without signs/symptoms of physical distress.      Resistance Training   Training Prescription Yes    Weight 4 lb    Reps 10-15             Perform Capillary Blood Glucose checks as needed.  Exercise Prescription Changes:   Exercise Prescription Changes     Row Name 07/30/22 1400 08/12/22 1400 08/27/22 1400 09/01/22 0800       Response to Exercise   Blood Pressure (Admit) 104/62 102/60 110/68 --    Blood Pressure (Exercise) 124/72 122/68 148/78 --    Blood Pressure (Exit) 100/64 98/60 124/78 --    Heart Rate (Admit) 61 bpm 78 bpm 63 bpm --    Heart Rate (Exercise) 90 bpm 102 bpm 94 bpm --    Heart Rate (Exit) 61 bpm 83 bpm 76 bpm --    Oxygen Saturation (Admit) 94 % 97 % 97 % --    Oxygen Saturation (Exercise) 94 % 95 % 93 % --    Oxygen Saturation (Exit) 96 % 95 % 95 % --    Rating of Perceived Exertion (Exercise) --    Perceived Dyspnea (Exercise) 2 2  3 --    Symptoms SOB SOB SOB --    Comments Results 3rd full day of exercise -- --    Duration -- Progress to 30 minutes of  aerobic without signs/symptoms of physical distress Continue with 30 min of aerobic exercise without signs/symptoms of physical distress. --    Intensity -- THRR unchanged THRR unchanged --      Progression   Progression -- Continue to progress workloads to maintain intensity without signs/symptoms of physical distress. Continue to progress workloads to maintain intensity without signs/symptoms of physical distress. --    Average METs -- 2.6 2.76 --      Resistance Training   Training Prescription -- Yes Yes --    Weight -- 4 lb 4 lb --    Reps -- 10-15 10-15 --      Interval Training   Interval Training -- No No --      Treadmill   MPH -- 1.8 1.9 --    Grade -- 2 1.5 --     Minutes -- 15 15 --    METs -- 2.87 2.85 --      NuStep   Level -- 2 2 --    Minutes -- 15 15 --    METs -- 2.5 2.3 --      Recumbant Elliptical   Level -- -- 1 --    Minutes -- -- 15 --    METs -- -- 1.4 --      REL-XR   Level -- 2 3 --    Minutes -- 15 15 --    METs -- 2.6 3.7 --      T5 Nustep   Level -- 1 4 --    Minutes -- 15 15 --    METs -- 2.3 2.2 --      Home Exercise Plan   Plans to continue exercise at -- -- -- Home (comment)  walking    Frequency -- -- -- Add 2 additional days to program exercise sessions.    Initial Home Exercises Provided -- -- -- 08/31/22      Oxygen   Maintain Oxygen Saturation -- 88% or higher 88% or higher --             Exercise Comments:   Exercise Comments     Row Name 08/03/22 1539           Exercise Comments First full day of exercise!  Patient was oriented to gym and equipment including functions, settings, policies, and procedures.  Patient's individual exercise prescription and treatment plan were reviewed.  All starting workloads were established based on the results of the 6 minute walk test done at initial orientation visit.  The plan for exercise progression was also introduced and progression will be customized based on patient's performance and goals.                Exercise Goals and Review:   Exercise Goals     Row Name 07/30/22 1112             Exercise Goals   Increase Physical Activity Yes       Intervention Provide advice, education, support and counseling about physical activity/exercise needs.;Develop an individualized exercise prescription for aerobic and resistive training based on initial evaluation findings, risk stratification, comorbidities and participant's personal goals.       Expected Outcomes Short Term: Attend rehab on a regular basis to increase amount of physical activity.;Long Term: Add in  home exercise to make exercise part of routine and to increase amount of physical  activity.;Long Term: Exercising regularly at least 3-5 days a week.       Increase Strength and Stamina Yes       Intervention Provide advice, education, support and counseling about physical activity/exercise needs.;Develop an individualized exercise prescription for aerobic and resistive training based on initial evaluation findings, risk stratification, comorbidities and participant's personal goals.       Expected Outcomes Short Term: Increase workloads from initial exercise prescription for resistance, speed, and METs.;Long Term: Improve cardiorespiratory fitness, muscular endurance and strength as measured by increased METs and functional capacity ( );Short Term: Perform resistance training exercises routinely during rehab and add in resistance training at home       Able to understand and use rate of perceived exertion (RPE) scale Yes       Intervention Provide education and explanation on how to use RPE scale       Expected Outcomes Short Term: Able to use RPE daily in rehab to express subjective intensity level;Long Term:  Able to use RPE to guide intensity level when exercising independently       Able to understand and use Dyspnea scale Yes       Intervention Provide education and explanation on how to use Dyspnea scale       Expected Outcomes Short Term: Able to use Dyspnea scale daily in rehab to express subjective sense of shortness of breath during exertion;Long Term: Able to use Dyspnea scale to guide intensity level when exercising independently       Knowledge and understanding of Target Heart Rate Range (THRR) Yes       Intervention Provide education and explanation of THRR including how the numbers were predicted and where they are located for reference       Expected Outcomes Short Term: Able to state/look up THRR;Long Term: Able to use THRR to govern intensity when exercising independently;Short Term: Able to use daily as guideline for intensity in rehab       Able to check  pulse independently Yes       Intervention Provide education and demonstration on how to check pulse in carotid and radial arteries.;Review the importance of being able to check your own pulse for safety during independent exercise       Expected Outcomes Short Term: Able to explain why pulse checking is important during independent exercise;Long Term: Able to check pulse independently and accurately       Understanding of Exercise Prescription Yes       Intervention Provide education, explanation, and written materials on patient's individual exercise prescription       Expected Outcomes Short Term: Able to explain program exercise prescription;Long Term: Able to explain home exercise prescription to exercise independently                Exercise Goals Re-Evaluation :  Exercise Goals Re-Evaluation     Row Name 08/03/22 1539 08/12/22 1426 08/27/22 1446 09/01/22 0822       Exercise Goal Re-Evaluation   Exercise Goals Review Increase Physical Activity;Able to understand and use rate of perceived exertion (RPE) scale;Knowledge and understanding of Target Heart Rate Range (THRR);Understanding of Exercise Prescription;Increase Strength and Stamina;Able to check pulse independently;Able to understand and use Dyspnea scale Increase Physical Activity;Increase Strength and Stamina;Understanding of Exercise Prescription Increase Physical Activity;Increase Strength and Stamina;Understanding of Exercise Prescription Increase Physical Activity;Increase Strength and Stamina;Understanding of Exercise Prescription;Able to understand and use  rate of perceived exertion (RPE) scale;Able to understand and use Dyspnea scale;Knowledge and understanding of Target Heart Rate Range (THRR);Able to check pulse independently    Comments Reviewed RPE scale, THR and program prescription with pt today.  Pt voiced understanding and was given a copy of goals to take home. Gaither is off to a good start with his rehab sessions.  He has been able to do his initial exercise prescription and even went up to 2% incline on the treadmill. He is not quite hitting his THR but will continue to monitor that as he progresses in the program. Banyan continues to do well in rehab. He recently increased his overall average MET level to 2.76 METs. He also improved to level 4 on the T5 nustep and level 3 on the XR. He began using the recumbent elliptical at level 1 as well. He also increased his treadmill speed to 1.9 mph while maintaining an incline of 1.5%. We will continue to monitor his progress in the program. Reviewed home exercise with pt today.  Pt plans to walk at home for exercise.  He is going to build up to 30 min.  Reviewed THR, pulse, RPE, sign and symptoms, pulse oximetery and when to call 911 or MD.  Also discussed weather considerations and indoor options.  Pt voiced understanding.    Expected Outcomes Short: Use RPE daily to regulate intensity.  Long: Follow program prescription in THR. Short: Continue initial exercise prescription Long: Increase overall strength and stamina Short: Continue to progressively increase treadmill workload. Long: Continue to improve strength and stamina. Short: Start to build up time walking at home Long; conitnue to exercise indpendently             Discharge Exercise Prescription (Final Exercise Prescription Changes):  Exercise Prescription Changes - 09/01/22 0800       Home Exercise Plan   Plans to continue exercise at Home (comment)   walking   Frequency Add 2 additional days to program exercise sessions.    Initial Home Exercises Provided 08/31/22             Nutrition:  Target Goals: Understanding of nutrition guidelines, daily intake of sodium 1500mg , cholesterol 200mg , calories 30% from fat and 7% or less from saturated fats, daily to have 5 or more servings of fruits and vegetables.  Education: All About Nutrition: -Group instruction provided by verbal, written material,  interactive activities, discussions, models, and posters to present general guidelines for heart healthy nutrition including fat, fiber, MyPlate, the role of sodium in heart healthy nutrition, utilization of the nutrition label, and utilization of this knowledge for meal planning. Follow up email sent as well. Written material given at graduation. Flowsheet Row Pulmonary Rehab from 08/26/2022 in [facility] Cardiac and Pulmonary Rehab  Education need identified 07/30/22  Date 08/26/22  Educator KW  Instruction Review Code 1- Verbalizes Understanding       Biometrics:  Pre Biometrics - 07/30/22 1112       Pre Biometrics   Height 5' 9.75" (1.772 m)    Weight 225 lb 9.6 oz (102.3 kg)    Waist Circumference 43 inches    Hip Circumference 44 inches    Waist to Hip Ratio 0.98 %    BMI (Calculated) 32.59    Single Leg Stand 30 seconds              Nutrition Therapy Plan and Nutrition Goals:  Nutrition Therapy & Goals - 07/30/22 1038  Nutrition Therapy   Diet Heart healthy, low Na    Drug/Food Interactions Statins/Certain Fruits    Protein (specify units) 80-85g    Fiber 35 grams    Whole Grain Foods 3 servings    Saturated Fats 16 max. grams    Fruits and Vegetables 8 servings/day    Sodium 2 grams      Personal Nutrition Goals   Nutrition Goal ST: practice MyPlate guidelines, practice reading food labels  LT: limit Na <2g/day, limit saturated fat <16g/day, aim for 30-35g of fiber per day    Comments 52 y.o. M admitted to pulmonary rehab for Chronic combined systolic and diastolic congestive heart failure. PMHx includes CAD, CKD stg 2, crohn's disease, HTN, pulmonary artery hypertension. Reviewed relevant medications atorvastatin, jardiance, zetia, pepcid, furosemide. Reviewed most recent labs. Ermias reports that his Crohns has been well controlled and his main trigger foods are certain types of processed meats; eating smaller/more frequent meals help to manage his symptoms as  well. Jadian reports limiting chinese food, pizza, and fried foods. He has been trying to use a grill for his cooking to avoid lots of added fat or grease. He has been trying to cut back on soda and now only has it occassionally. Discussed food patterns and heart healthy eating as well as MyPlate. Encouagred him to read labels, choose liquid cooking oils, and to include protein, healthy fat, and fiber at meals to make them more nutrient dense and satiating. Discussed CKD stg 2 MNT and how a modest protei is recommeneded (0.8 g/kg/d) - encouraged to be mindful of animal protein portions and to include plant protein.      Intervention Plan   Intervention Prescribe, educate and counsel regarding individualized specific dietary modifications aiming towards targeted core components such as weight, hypertension, lipid management, diabetes, heart failure and other comorbidities.;Nutrition handout(s) given to patient.    Expected Outcomes Short Term Goal: Understand basic principles of dietary content, such as calories, fat, sodium, cholesterol and nutrients.;Short Term Goal: A plan has been developed with personal nutrition goals set during dietitian appointment.;Long Term Goal: Adherence to prescribed nutrition plan.             Nutrition Assessments:  MEDIFICTS Score Key: ?70 Need to make dietary changes  40-70 Heart Healthy Diet ? 40 Therapeutic Level Cholesterol Diet  Flowsheet Row Pulmonary Rehab from 07/28/2022 in [facility] Cardiac and Pulmonary Rehab  Picture Your Plate Total Score on Admission 51      Picture Your Plate Scores: <16 Unhealthy dietary pattern with much room for improvement. 41-50 Dietary pattern unlikely to meet recommendations for good health and room for improvement. 51-60 More healthful dietary pattern, with some room for improvement.  >60 Healthy dietary pattern, although there may be some specific behaviors that could be improved.   Nutrition Goals Re-Evaluation:   Nutrition Goals Re-Evaluation     Row Name 08/17/22 1602             Goals   Current Weight 230 lb (104.3 kg)       Nutrition Goal Lose some weight       Comment Patient was informed on why it is important to maintain a balanced diet when dealing with Respiratory issues. Explained that it takes a lot of energy to breath and when they are short of breath often they will need to have a good diet to help keep up with the calories they are expending for breathing.       Expected Outcome  Short: Choose and plan snacks accordingly to patients caloric intake to improve breathing. Long: Maintain a diet independently that meets their caloric intake to aid in daily shortness of breath.                Nutrition Goals Discharge (Final Nutrition Goals Re-Evaluation):  Nutrition Goals Re-Evaluation - 08/17/22 1602       Goals   Current Weight 230 lb (104.3 kg)    Nutrition Goal Lose some weight    Comment Patient was informed on why it is important to maintain a balanced diet when dealing with Respiratory issues. Explained that it takes a lot of energy to breath and when they are short of breath often they will need to have a good diet to help keep up with the calories they are expending for breathing.    Expected Outcome Short: Choose and plan snacks accordingly to patients caloric intake to improve breathing. Long: Maintain a diet independently that meets their caloric intake to aid in daily shortness of breath.             Psychosocial: Target Goals: Acknowledge presence or absence of significant depression and/or stress, maximize coping skills, provide positive support system. Participant is able to verbalize types and ability to use techniques and skills needed for reducing stress and depression.   Education: Stress, Anxiety, and Depression - Group verbal and visual presentation to define topics covered.  Reviews how body is impacted by stress, anxiety, and depression.  Also discusses  healthy ways to reduce stress and to treat/manage anxiety and depression.  Written material given at graduation.   Education: Sleep Hygiene -Provides group verbal and written instruction about how sleep can affect your health.  Define sleep hygiene, discuss sleep cycles and impact of sleep habits. Review good sleep hygiene tips.    Initial Review & Psychosocial Screening:  Initial Psych Review & Screening - 07/28/22 1349       Initial Review   Current issues with Current Stress Concerns    Source of Stress Concerns Chronic Illness      Family Dynamics   Good Support System? Yes   mother     Barriers   Psychosocial barriers to participate in program There are no identifiable barriers or psychosocial needs.;The patient should benefit from training in stress management and relaxation.      Screening Interventions   Interventions To provide support and resources with identified psychosocial needs;Encouraged to exercise;Provide feedback about the scores to participant    Expected Outcomes Short Term goal: Utilizing psychosocial counselor, staff and physician to assist with identification of specific Stressors or current issues interfering with healing process. Setting desired goal for each stressor or current issue identified.;Long Term Goal: Stressors or current issues are controlled or eliminated.;Short Term goal: Identification and review with participant of any Quality of Life or Depression concerns found by scoring the questionnaire.;Long Term goal: The participant improves quality of Life and PHQ9 Scores as seen by post scores and/or verbalization of changes             Quality of Life Scores:  Scores of 19 and below usually indicate a poorer quality of life in these areas.  A difference of  2-3 points is a clinically meaningful difference.  A difference of 2-3 points in the total score of the Quality of Life Index has been associated with significant improvement in overall quality  of life, self-image, physical symptoms, and general health in studies assessing change in quality of  life.  PHQ-9: Review Flowsheet  More data may exist      08/17/2022 07/30/2022 07/15/2022 10/29/2021 10/14/2021  Depression screen PHQ 2/9  Decreased Interest 0 3 0 0 0  Down, Depressed, Hopeless 0 0 0 0 0  PHQ - 2 Score 0 3 0 0 0  Altered sleeping 0 3 0 - -  Tired, decreased energy 1 3 0 - -  Change in appetite 3 2 0 - -  Feeling bad or failure about yourself  0 0 0 - -  Trouble concentrating 0 0 0 - -  Moving slowly or fidgety/restless 0 0 0 - -  Suicidal thoughts 0 0 0 - -  PHQ-9 Score 4 11 0 - -  Difficult doing work/chores Not difficult at all Very difficult Not difficult at all - -   Interpretation of Total Score  Total Score Depression Severity:  1-4 = Minimal depression, 5-9 = Mild depression, 10-14 = Moderate depression, 15-19 = Moderately severe depression, 20-27 = Severe depression   Psychosocial Evaluation and Intervention:  Psychosocial Evaluation - 07/28/22 1418       Psychosocial Evaluation & Interventions   Interventions Stress management education;Relaxation education;Encouraged to exercise with the program and follow exercise prescription    Comments Gerrett is coming to Pulmonary Rehab for combined heart failure. His most recent EF is 35-40%. He reports having two heart attacks in the past. 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 He is living with his mother since his heart attack. He is hesitant to exercise or get back to his normal routine because he is scared about his heart and doesn't want anything to happen to him for his mother's sake. He mentioned that if he goes out, he always makes sure he is close to a hospital in case something happens. He enjoys talking about his job, but doesn't know if he will be able to do all that is required physically. 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 He wants to come to the program to work on his strength and stamina. He also wants more education on what is going on with his heart and his prognosis.    Expected Outcomes Short: attend pulmonary rehab for education and exercise. Long: develop and maintain positive self care habits.    Continue Psychosocial Services  Follow up required by staff             Psychosocial Re-Evaluation:  Psychosocial Re-Evaluation     Row Name 08/17/22 1604             Psychosocial Re-Evaluation   Current issues with Current Stress Concerns       Comments Reviewed patient health questionnaire (PHQ-9) with patient for follow up. Previously, patients score indicated signs/symptoms of depression.  Reviewed to see if patient is improving symptom wise while in program.  Score improved and patient states that it is because he has been able to have more energy.       Expected Outcomes Short: Continue to attend [facility] regularly for regular exercise and social engagement. Long: Continue to improve symptoms and manage a positive mental state.       Continue Psychosocial Services  Follow up required by staff                Psychosocial Discharge (Final Psychosocial Re-Evaluation):  Psychosocial Re-Evaluation - 08/17/22 1604  Psychosocial Re-Evaluation   Current issues with Current Stress Concerns    Comments Reviewed patient health questionnaire (PHQ-9) with patient for follow up. Previously, patients score indicated signs/symptoms of depression.  Reviewed to see if patient is improving symptom wise while in program.  Score improved and patient states that it is because he has been able to have more energy.    Expected Outcomes Short: Continue to attend [facility] regularly for regular exercise and social engagement. Long: Continue to improve symptoms and manage a positive mental state.    Continue Psychosocial Services  Follow up required by staff              Education: Education Goals: Education classes will be provided on a weekly basis, covering required topics. Participant will state understanding/return demonstration of topics presented.  Learning Barriers/Preferences:  Learning Barriers/Preferences - 07/28/22 1335       Learning Barriers/Preferences   Learning Barriers None    Learning Preferences None             General Pulmonary Education Topics:  Infection Prevention: - Provides verbal and written material to individual with discussion of infection control including proper hand washing and proper equipment cleaning during exercise session. Flowsheet Row Pulmonary Rehab from 08/26/2022 in [facility] Cardiac and Pulmonary Rehab  Date 07/30/22  Educator NT  Instruction Review Code 1- Verbalizes Understanding       Falls Prevention: - Provides verbal and written material to individual with discussion of falls prevention and safety. Flowsheet Row Pulmonary Rehab from 08/26/2022 in [facility] Cardiac and Pulmonary Rehab  Date 07/30/22  Educator NT  Instruction Review Code 1- Verbalizes Understanding       Chronic Lung Disease Review: - Group verbal instruction with posters, models, PowerPoint presentations and videos,  to review new updates, new respiratory medications, new advancements in procedures and treatments. Providing information on websites and "800" numbers for continued self-education. Includes information about supplement oxygen, available portable oxygen systems, continuous and intermittent flow rates, oxygen safety, concentrators, and Medicare reimbursement for oxygen. Explanation of Pulmonary Drugs, including class, frequency, complications, importance of spacers, rinsing mouth after steroid MDI's, and proper cleaning methods for nebulizers. Review of basic lung anatomy and physiology related to function, structure, and complications of lung disease. Review of risk factors. Discussion about methods for diagnosing sleep  apnea and types of masks and machines for OSA. Includes a review of the use of types of environmental controls: home humidity, furnaces, filters, dust mite/pet prevention, HEPA vacuums. Discussion about weather changes, air quality and the benefits of nasal washing. Instruction on Warning signs, infection symptoms, calling MD promptly, preventive modes, and value of vaccinations. Review of effective airway clearance, coughing and/or vibration techniques. Emphasizing that all should Create an Action Plan. Written material given at graduation.   AED/CPR: - Group verbal and written instruction with the use of models to demonstrate the basic use of the AED with the basic ABC's of resuscitation.    Anatomy and Cardiac Procedures: - Group verbal and visual presentation and models provide information about basic cardiac anatomy and function. Reviews the testing methods done to diagnose heart disease and the outcomes of the test results. Describes the treatment choices: Medical Management, Angioplasty, or Coronary Bypass Surgery for treating various heart conditions including Myocardial Infarction, Angina, Valve Disease, and Cardiac Arrhythmias.  Written material given at graduation. Flowsheet Row Pulmonary Rehab from 08/26/2022 in [facility] Cardiac and Pulmonary Rehab  Education need identified 07/30/22       Medication Safety: -  Group verbal and visual instruction to review commonly prescribed medications for heart and lung disease. Reviews the medication, class of the drug, and side effects. Includes the steps to properly store meds and maintain the prescription regimen.  Written material given at graduation. Flowsheet Row Pulmonary Rehab from 08/26/2022 in [facility] Card[facility] and Pulmonary Rehab  Education need identified 07/30/22       Other: -Provides group and verbal instruction on various topics (see comments)   Knowledge Questionnaire Score:  Knowledge Questionnaire Score - 07/28/22 1458        Knowledge Questionnaire Score   Pre Score 15/26              Core Components/Risk Factors/Patient Goals at Admission:  Personal Goals and Risk Factors at Admission - 07/30/22 1110       Core Components/Risk Factors/Patient Goals on Admission    Weight Management Yes;Weight Loss    Intervention Weight Management: Develop a combined nutrition and exercise program designed to reach desired caloric intake, while maintaining appropriate intake of nutrient and fiber, sodium and fats, and appropriate energy expenditure required for the weight goal.;Weight Management: Provide education and appropriate resources to help participant work on and attain dietary goals.;Weight Management/Obesity: Establish reasonable short term and long term weight goals.    Admit Weight 225 lb 9.6 oz (102.3 kg)    Goal Weight: Short Term 215 lb (97.5 kg)    Goal Weight: Long Term 195 lb (88.5 kg)    Expected Outcomes Weight Loss: Understanding of general recommendations for a balanced deficit meal plan, which promotes 1-2 lb weight loss per week and includes a negative energy balance of [phone] kcal/d;Short Term: Continue to assess and modify interventions until short term weight is achieved;Long Term: Adherence to nutrition and physical activity/exercise program aimed toward attainment of established weight goal;Understanding recommendations for meals to include 15-35% energy as protein, 25-35% energy from fat, 35-60% energy from carbohydrates, less than 200mg  of dietary cholesterol, 20-35 gm of total fiber daily;Understanding of distribution of calorie intake throughout the day with the consumption of 4-5 meals/snacks    Heart Failure Yes    Intervention Provide a combined exercise and nutrition program that is supplemented with education, support and counseling about heart failure. Directed toward relieving symptoms such as shortness of breath, decreased exercise tolerance, and extremity edema.    Expected Outcomes  Improve functional capacity of life;Short term: Attendance in program 2-3 days a week with increased exercise capacity. Reported lower sodium intake. Reported increased fruit and vegetable intake. Reports medication compliance.;Long term: Adoption of self-care skills and reduction of barriers for early signs and symptoms recognition and intervention leading to self-care maintenance.;Short term: Daily weights obtained and reported for increase. Utilizing diuretic protocols set by physician.    Hypertension Yes    Intervention Provide education on lifestyle modifcations including regular physical activity/exercise, weight management, moderate sodium restriction and increased consumption of fresh fruit, vegetables, and low fat dairy, alcohol moderation, and smoking cessation.;Monitor prescription use compliance.    Expected Outcomes Long Term: Maintenance of blood pressure at goal levels.;Short Term: Continued assessment and intervention until BP is < 140/52mm HG in hypertensive participants. < 130/55mm HG in hypertensive participants with diabetes, heart failure or chronic kidney disease.    Lipids Yes    Intervention Provide education and support for participant on nutrition & aerobic/resistive exercise along with prescribed medications to achieve LDL 70mg , HDL >40mg .    Expected Outcomes Short Term: Participant states understanding of desired cholesterol values and is compliant  with medications prescribed. Participant is following exercise prescription and nutrition guidelines.;Long Term: Cholesterol controlled with medications as prescribed, with individualized exercise RX and with personalized nutrition plan. Value goals: LDL < 70mg , HDL > 40 mg.             Education:Diabetes - Individual verbal and written instruction to review signs/symptoms of diabetes, desired ranges of glucose level fasting, after meals and with exercise. Acknowledge that pre and post exercise glucose checks will be done for  3 sessions at entry of program.   Know Your Numbers and Heart Failure: - Group verbal and visual instruction to discuss disease risk factors for cardiac and pulmonary disease and treatment options.  Reviews associated critical values for Overweight/Obesity, Hypertension, Cholesterol, and Diabetes.  Discusses basics of heart failure: signs/symptoms and treatments.  Introduces Heart Failure Zone chart for action plan for heart failure.  Written material given at graduation. Flowsheet Row Pulmonary Rehab from 08/26/2022 in [facility] Cardiac and Pulmonary Rehab  Education need identified 07/30/22       Core Components/Risk Factors/Patient Goals Review:   Goals and Risk Factor Review     Row Name 08/17/22 1602             Core Components/Risk Factors/Patient Goals Review   Personal Goals Review Improve shortness of breath with ADL's       Review Spoke to patient about their shortness of breath and what they can do to improve. Patient has been informed of breathing techniques when starting the program. Patient is informed to tell staff if they have had any med changes and that certain meds they are taking or not taking can be causing shortness of breath.       Expected Outcomes Short: Attend [facility] regularly to improve shortness of breath with ADL's. Long: maintain independence with ADL's                Core Components/Risk Factors/Patient Goals at Discharge (Final Review):   Goals and Risk Factor Review - 08/17/22 1602       Core Components/Risk Factors/Patient Goals Review   Personal Goals Review Improve shortness of breath with ADL's    Review Spoke to patient about their shortness of breath and what they can do to improve. Patient has been informed of breathing techniques when starting the program. Patient is informed to tell staff if they have had any med changes and that certain meds they are taking or not taking can be causing shortness of breath.    Expected Outcomes Short:  Attend [facility] regularly to improve shortness of breath with ADL's. Long: maintain independence with ADL's             ITP Comments:  ITP Comments     Row Name 07/28/22 1308 07/30/22 1108 08/03/22 1539 08/05/22 0831 09/02/22 1159   ITP Comments Initial orientation completed. Diagnosis can be found in [facility] 2/28. EP Orientation scheduled for Thursday 3/14 at 9:30am. Completed and gym orientation. Initial ITP created and sent for review to Dr. Vida Rigger, Medical Director. First full day of exercise!  Patient was oriented to gym and equipment including functions, settings, policies, and procedures.  Patient's individual exercise prescription and treatment plan were reviewed.  All starting workloads were established based on the results of the 6 minute walk test done at initial orientation visit.  The plan for exercise progression was also introduced and progression will be customized based on patient's performance and goals. 30 Day review completed. Medical Director ITP review done,  changes made as directed, and signed approval by Medical Director.     new to program 30 day review completed. ITP sent to Dr. Jinny Sanders, Medical Director of  Pulmonary Rehab. Continue with ITP unless changes are made by physician.            Comments: 30 day review

## 2022-09-02 NOTE — Progress Notes (Signed)
Daily Session Note  Patient Details  Name: Clarence [name] MRN: [id number] Date of Birth: [birth year] Referring Provider:   Flowsheet Row Pulmonary Rehab from 07/30/2022 in [facility] Cardiac and Pulmonary Rehab  Referring Provider Dr. Yvonne Kendall, MD       Encounter Date: 09/02/2022  Check In:  Session Check In - 09/02/22 1535       Check-In   Supervising physician immediately available to respond to emergencies See telemetry face sheet for immediately available ER MD    Location [facility]-Cardiac & Pulmonary Rehab    Staff Present Lanny Hurst, RN, ADN;Joseph Hood, Algernon Huxley, BS, ACSM CEP, Exercise Physiologist;Meredith Jewel Baize, RN BSN    Virtual Visit No    Medication changes reported     No    Fall or balance concerns reported    No    Warm-up and Cool-down Performed on first and last piece of equipment    Resistance Training Performed Yes    VAD Patient? No    PAD/SET Patient? No      Pain Assessment   Currently in Pain? No/denies                Social History   Tobacco Use  Smoking Status Former   Types: Cigars   Quit date: 08/13/2021   Years since quitting: 1.0  Smokeless Tobacco Not on file    Goals Met:  Independence with exercise equipment Exercise tolerated well No report of concerns or symptoms today Strength training completed today  Goals Unmet:  Not Applicable  Comments: Pt able to follow exercise prescription today without complaint.  Will continue to monitor for progression.    Dr. Bethann Punches is Medical Director for [facility] Cardiac Rehabilitation.  Dr. Vida Rigger is Medical Director for [facility] Pulmonary Rehabilitation.

## 2022-09-03 ENCOUNTER — Encounter: Payer: Medicaid Other | Admitting: *Deleted

## 2022-09-03 DIAGNOSIS — I5042 Chronic combined systolic (congestive) and diastolic (congestive) heart failure: Secondary | ICD-10-CM

## 2022-09-03 NOTE — Progress Notes (Signed)
Daily Session Note  Patient Details  Name: Clarence [name] MRN: [id number] Date of Birth: [birth year] Referring Provider:   Flowsheet Row Pulmonary Rehab from 07/30/2022 in [facility] Cardiac and Pulmonary Rehab  Referring Provider Dr. Yvonne Kendall, MD       Encounter Date: 09/03/2022  Check In:  Session Check In - 09/03/22 1535       Check-In   Supervising physician immediately available to respond to emergencies See telemetry face sheet for immediately available ER MD    Location [facility]-Cardiac & Pulmonary Rehab    Staff Present Lanny Hurst, RN, ADN;Jessica Juanetta Gosling, MA, RCEP, CCRP, CCET;Noah Tickle, BS, Exercise Physiologist    Virtual Visit No    Medication changes reported     No    Fall or balance concerns reported    No    Warm-up and Cool-down Performed on first and last piece of equipment    Resistance Training Performed Yes    VAD Patient? No    PAD/SET Patient? No      Pain Assessment   Currently in Pain? No/denies                Social History   Tobacco Use  Smoking Status Former   Types: Cigars   Quit date: 08/13/2021   Years since quitting: 1.0  Smokeless Tobacco Not on file    Goals Met:  Independence with exercise equipment Exercise tolerated well No report of concerns or symptoms today Strength training completed today  Goals Unmet:  Not Applicable  Comments: Pt able to follow exercise prescription today without complaint.  Will continue to monitor for progression.    Dr. Bethann Punches is Medical Director for [facility] Cardiac Rehabilitation.  Dr. Vida Rigger is Medical Director for [facility] Pulmonary Rehabilitation.

## 2022-09-07 ENCOUNTER — Other Ambulatory Visit: Payer: Self-pay

## 2022-09-07 ENCOUNTER — Encounter: Payer: Self-pay | Admitting: Emergency Medicine

## 2022-09-07 ENCOUNTER — Telehealth: Payer: Self-pay | Admitting: Internal Medicine

## 2022-09-07 ENCOUNTER — Encounter: Payer: Medicaid Other | Admitting: *Deleted

## 2022-09-07 ENCOUNTER — Emergency Department
Admission: EM | Admit: 2022-09-07 | Discharge: 2022-09-07 | Disposition: A | Discharge status: Home / Self Care | Payer: Medicaid Other | Attending: Emergency Medicine | Admitting: Emergency Medicine

## 2022-09-07 DIAGNOSIS — I959 Hypotension, unspecified: Secondary | ICD-10-CM | POA: Diagnosis not present

## 2022-09-07 DIAGNOSIS — R42 Dizziness and giddiness: Secondary | ICD-10-CM | POA: Diagnosis present

## 2022-09-07 DIAGNOSIS — I5042 Chronic combined systolic (congestive) and diastolic (congestive) heart failure: Secondary | ICD-10-CM | POA: Diagnosis not present

## 2022-09-07 DIAGNOSIS — E860 Dehydration: Secondary | ICD-10-CM

## 2022-09-07 LAB — CBC WITH DIFFERENTIAL/PLATELET
Abs Immature Granulocytes: 0.01 10*3/uL (ref 0.00–0.07)
Basophils Absolute: 0 10*3/uL (ref 0.0–0.1)
Basophils Relative: 0 %
Eosinophils Absolute: 0.2 10*3/uL (ref 0.0–0.5)
Eosinophils Relative: 2 %
HCT: 47.6 % (ref 39.0–52.0)
Hemoglobin: 15.8 g/dL (ref 13.0–17.0)
Immature Granulocytes: 0 %
Lymphocytes Relative: 22 %
Lymphs Abs: 1.6 10*3/uL (ref 0.7–4.0)
MCH: 27.8 pg (ref 26.0–34.0)
MCHC: 33.2 g/dL (ref 30.0–36.0)
MCV: 83.8 fL (ref 80.0–100.0)
Monocytes Absolute: 0.7 10*3/uL (ref 0.1–1.0)
Monocytes Relative: 9 %
Neutro Abs: 5 10*3/uL (ref 1.7–7.7)
Neutrophils Relative %: 67 %
Platelets: 242 10*3/uL (ref 150–400)
RBC: 5.68 MIL/uL (ref 4.22–5.81)
RDW: 13.5 % (ref 11.5–15.5)
WBC: 7.5 10*3/uL (ref 4.0–10.5)
nRBC: 0 % (ref 0.0–0.2)

## 2022-09-07 LAB — COMPREHENSIVE METABOLIC PANEL
ALT: 31 U/L (ref 0–44)
AST: 26 U/L (ref 15–41)
Albumin: 4 g/dL (ref 3.5–5.0)
Alkaline Phosphatase: 92 U/L (ref 38–126)
Anion gap: 8 (ref 5–15)
BUN: 26 mg/dL — ABNORMAL HIGH (ref 6–20)
CO2: 24 mmol/L (ref 22–32)
Calcium: 9.7 mg/dL (ref 8.9–10.3)
Chloride: 107 mmol/L (ref 98–111)
Creatinine, Ser: 1.53 mg/dL — ABNORMAL HIGH (ref 0.61–1.24)
GFR, Estimated: 54 mL/min — ABNORMAL LOW (ref >60)
Glucose, Bld: 96 mg/dL (ref 70–99)
Potassium: 4.6 mmol/L (ref 3.5–5.1)
Sodium: 139 mmol/L (ref 135–145)
Total Bilirubin: 0.8 mg/dL (ref 0.3–1.2)
Total Protein: 7.6 g/dL (ref 6.5–8.1)

## 2022-09-07 MED ORDER — SODIUM CHLORIDE 0.9 % IV BOLUS
500.0000 mL | Freq: Once | INTRAVENOUS | Status: AC
  Administered 2022-09-07: 500 mL via INTRAVENOUS

## 2022-09-07 NOTE — Telephone Encounter (Signed)
Called Clarence [name] regarding BP readings received from Cardiac Rehab. Clarence [name] stated today while at Rehab, he felt very dizzy. BP and HR readings listed below. Clarence [name] stated he is currently sitting in the parking lot of ER due to feeling very dizzy and fatigue.  Based on vitals and symptoms, nurse recommended Clarence [name] go in the ER for further evaluations as office is currently closed. Clarence [name] verbalized understanding  09/07/22 3:29 pm HR 64 BP 82/58 (Pre Test) 4:04 pm HR 76 BP 92/60 (on bike) 4:19 pm HR 72 BP 80/58 (on bike) 4:24 pm HR 67 BP 84/60 (Post Test)

## 2022-09-07 NOTE — ED Provider Notes (Signed)
[facility] Provider Note    Event Date/Time   First MD Initiated Contact with Patient 09/07/22 1831     (approximate)   History   Hypotension   HPI  Clarence [name] is a 52 y.o. male reports that he has been feeling a little bit lightheaded and slightly nauseated since last night.  He came to do his physical therapy today minister blood pressure slightly low, and he decided to proceed with cardiac rehab today.  While on the bike he also felt lightheaded and his blood pressure was noted to be hypotensive blood pressure in the 90s.  He stopped, they recommended that it would be reasonable for him to come up and have his "blood checked" at the ER.  Patient reports he otherwise feels well except he has noticed for the last 6 months that he will intermittently have increased amount of loose stool with his ostomy.  Noticed a little bit increase in his loose stool the last day or 2.  No abdominal pain no fevers.  No chest pain no trouble breathing.  He does not feel like he is in a pass out just a little lightheaded and slightly nauseated.  Feels improved now.  Eating and drinking drink about 5 cups of water while at rehab.  Checks his blood pressure every couple of days, typically sits around 100 210, the last 4 days of his blood pressure has been running in the low 90s.  Continues to take his cardiac medications and Lasix  He does feel thirsty  No black or bloody output from ostomy     Physical Exam   Triage Vital Signs: ED Triage Vitals [09/07/22 1723]  Enc Vitals Group     BP 116/86     Pulse Rate 64     Resp 18     Temp 98.4 F (36.9 C)     Temp Source Oral     SpO2 97 %     Weight      Height      Head Circumference      Peak Flow      Pain Score 0     Pain Loc      Pain Edu?      Excl. in GC?     Most recent vital signs: Vitals:   09/07/22 1920 09/07/22 1925  BP: 126/85 (!) 120/100  Pulse: (!) 44 (!) 58  Resp: 16 15  Temp:     SpO2: 98% 96%     General: Awake, no distress.  This membranes appear normal CV:  Good peripheral perfusion.  Normal tones and rate. Resp:  Normal effort.  Clear lungs bilaterally.  Normal work of breathing Abd:  No distention.  Ostomy draining slightly greenish color, slightly loose stool but no large volume output noted.  No black or bloody output.  Ostomy is purple patent and protuberant Other:  No lower extremity edema.  No JVD.   ED Results / Procedures / Treatments   Labs (all labs ordered are listed, but only abnormal results are displayed) Labs Reviewed  COMPREHENSIVE METABOLIC PANEL - Abnormal; Notable for the following components:      Result Value   BUN 26 (*)    Creatinine, Ser 1.53 (*)    GFR, Estimated 54 (*)    All other components within normal limits  CBC WITH DIFFERENTIAL/PLATELET     EKG  Entered by me at 1925 heart rate 60 QRS 120 QTc 460 Normal sinus  rhythm prior to inferior Q waves.  T wave inversion in aVL, T wave inversion noted in V5 and V6.   RADIOLOGY     PROCEDURES:  Critical Care performed: No  Procedures   MEDICATIONS ORDERED IN ED: Medications  sodium chloride 0.9 % bolus 500 mL (500 mLs Intravenous New Bag/Given 09/07/22 1928)     IMPRESSION / MDM / ASSESSMENT AND PLAN / ED COURSE  I reviewed the triage vital signs and the nursing notes.                              Differential diagnosis includes, but is not limited to, possible dehydration, orthostasis, increased fluid losses with slight output but increased from his ostomy, electrolyte abnormality, etc.  No infectious symptomatology.  No fevers or chills.  Reassuring workup.  Labs demonstrate very mild increase in creatinine from baseline.  Given the use of multiple cardiac medications and Lasix and his current euvolemic to slightly hypovolemic appearance on exam I suspect this is likely a small amount of dehydration and medication effect.  After receiving hydration his blood  pressure is normal 120 systolic, he feels well and comfortable plan for discharge.  I have made contact with the heart failure clinic placed recommendation to follow-up in 24 to 48 hours via referral process, and notify [facility].  Patient comfortable plan for discharge.  Will hold his furosemide for the next 3 days and then resume and in the interim plan to follow-up with cardiology/CHF clinic.  No chest pain no shortness of breath.  Careful return precautions advised    Patient's presentation is most consistent with acute complicated illness / injury requiring diagnostic workup.   The patient is on the cardiac monitor to evaluate for evidence of arrhythmia and/or significant heart rate changes.  Vitals:   09/07/22 1920 09/07/22 1925  BP: 126/85 (!) 120/100  Pulse: (!) 44 (!) 58  Resp: 16 15  Temp:    SpO2: 98% 96%        FINAL CLINICAL IMPRESSION(S) / ED DIAGNOSES   Final diagnoses:  Dehydration  Hypotension, unspecified hypotension type     Rx / DC Orders   ED Discharge Orders          Ordered    AMB referral to CHF clinic       Comments: ER follow-up. Request sent to [facility].   09/07/22 2116             Note:  This document was prepared using Dragon voice recognition software and may include unintentional dictation errors.   Sharyn Creamer, MD 09/07/22 2118

## 2022-09-07 NOTE — ED Notes (Signed)
ED Provider at bedside. 

## 2022-09-07 NOTE — ED Triage Notes (Signed)
Patient to ED from physical therapy for hypotension. Patient has been feeling dizzy and nauseous- 84/60. Currently just feeling tired. Did eat snack after therapy and before coming to ED.

## 2022-09-07 NOTE — Discharge Instructions (Addendum)
I recommend you hold off on taking your furosemide for 3 days. Follow-up with cardiology within 24-48 hours.

## 2022-09-07 NOTE — Telephone Encounter (Signed)
Patient came by office to drop off HeartTrack readings to be reviewed Please call to discuss

## 2022-09-07 NOTE — Progress Notes (Signed)
Daily Session Note  Patient Details  Name: Clarence [name] MRN: [id number] Date of Birth: June 09, 1970 Referring Provider:   Flowsheet Row Pulmonary Rehab from 07/30/2022 in [facility] Cardiac and Pulmonary Rehab  Referring Provider Dr. Yvonne Kendall, MD       Encounter Date: 09/07/2022  Check In:  Session Check In - 09/07/22 1553       Check-In   Supervising physician immediately available to respond to emergencies See telemetry face sheet for immediately available ER MD    Location [facility]-Cardiac & Pulmonary Rehab    Staff Present Susann Givens, RN BSN;Joseph Reino Kent, RCP,RRT,BSRT;Noah Yon[place], [place], Exercise Physiologist    Virtual Visit No    Medication changes reported     No    Fall or balance concerns reported    No    Warm-up and Cool-down Performed on first and last piece of equipment    Resistance Training Performed Yes    VAD Patient? No    PAD/SET Patient? No      Pain Assessment   Currently in Pain? No/denies                Social History   Tobacco Use  Smoking Status Former   Types: Cigars   Quit date: 08/13/2021   Years since quitting: 1.0  Smokeless Tobacco Not on file    Goals Met:  Independence with exercise equipment Exercise tolerated well No report of concerns or symptoms today Strength training completed today  Goals Unmet:  Not Applicable  Comments: Pt able to follow exercise prescription today without complaint.  Will continue to monitor for progression.    Dr. Bethann Punches is Medical Director for [facility] Cardiac Rehabilitation.  Dr. Vida Rigger is Medical Director for [facility] Pulmonary Rehabilitation.

## 2022-09-08 ENCOUNTER — Encounter: Payer: Self-pay | Admitting: Family

## 2022-09-08 ENCOUNTER — Telehealth: Payer: Self-pay

## 2022-09-08 ENCOUNTER — Other Ambulatory Visit (HOSPITAL_COMMUNITY): Payer: Self-pay | Admitting: Family Medicine

## 2022-09-08 ENCOUNTER — Other Ambulatory Visit (HOSPITAL_COMMUNITY): Payer: Self-pay

## 2022-09-08 MED ORDER — FUROSEMIDE 20 MG PO TABS
20.0000 mg | ORAL_TABLET | ORAL | 4 refills | Status: DC | PRN
  Filled 2022-09-08: qty 30, 30d supply, fill #0

## 2022-09-08 NOTE — Transitions of Care (Post Inpatient/ED Visit) (Signed)
   09/08/2022  Name: ESLEY BROOKING MRN: [id number] DOB: 22-Jul-1970  Today's TOC FU Call Status: Today's TOC FU Call Status:: Successful TOC FU Call Competed TOC FU Call Complete Date: 09/08/22  Transition Care Management Follow-up Telephone Call Date of Discharge: 09/07/22 Discharge Facility: [facility]) Type of Discharge: Emergency Department Primary Inpatient Discharge Diagnosis:: Dehydration/Hypotension Reason for ED Visit: Other: (Dehydration/Hypotension) How have you been since you were released from the hospital?: Same Any questions or concerns?: Yes Patient Questions/Concerns:: Patient will save questions for appointment.  Items Reviewed: Did you receive and understand the discharge instructions provided?: Yes Medications obtained and verified?: Yes (Medications Reviewed) Any new allergies since your discharge?: No Dietary orders reviewed?: No Do you have support at home?: Yes People in Home: parent(s) Name of Support/Comfort Primary Source: [facility] and Equipment/Supplies: Were Home Health Services Ordered?: No Any new equipment or medical supplies ordered?: No  Functional Questionnaire: Do you need assistance with bathing/showering or dressing?: No Do you need assistance with meal preparation?: No Do you need assistance with eating?: No Do you have difficulty maintaining continence: No Do you need assistance with getting out of bed/getting out of a chair/moving?: No Do you have difficulty managing or taking your medications?: No  Follow up appointments reviewed: Date of PCP follow-up appointment?: 09/11/22 Follow-up Provider: Ella Bodo, NP Specialist [facility] Follow-up appointment confirmed?: No Do you need transportation to your follow-up appointment?: No Do you understand care options if your condition(s) worsen?: Yes-patient verbalized understanding    SIGNATURE: Dazia Lippold.D/RMA

## 2022-09-08 NOTE — Telephone Encounter (Signed)
Patient seen in ED yesterday and advised to hold furosemide x 3 days due to increased ostomy output and concern for dehydration.  He is scheduled for follow-up with me later this week.  He should let us know if any lightheadedness or other symptoms develop in the meantime.  Yvonne Kendall, MD [facility]

## 2022-09-08 NOTE — Telephone Encounter (Signed)
Pt made aware and verbalized understanding.

## 2022-09-09 ENCOUNTER — Encounter: Payer: Medicaid Other | Admitting: *Deleted

## 2022-09-09 DIAGNOSIS — I5042 Chronic combined systolic (congestive) and diastolic (congestive) heart failure: Secondary | ICD-10-CM

## 2022-09-09 NOTE — Progress Notes (Signed)
Incomplete Session Note  Patient Details  Name: IRINEO GAULIN MRN: [id number] Date of Birth: 09-20-1970 Referring Provider:   Flowsheet Row Pulmonary Rehab from 07/30/2022 in [facility] Cardiac and Pulmonary Rehab  Referring Provider Dr. Yvonne Kendall, MD       [name] did not complete his rehab session.  His blood pressure has been low 80s and he has been symptomatic. Last week he went to the ED for dehydration where his medications were reviewed. They told him to pause his furosemide, but upon further investigation he realized he hadn't been taken it for a while because it was not in his medication bag.   He has a follow up with his cardiologist tomorrow and his PCP on Friday to discuss symptoms and medication. Patient encouraged to stay hydrated and not exercise until he sees his doctors.

## 2022-09-10 ENCOUNTER — Ambulatory Visit: Payer: Medicaid Other | Attending: Internal Medicine | Admitting: Internal Medicine

## 2022-09-10 ENCOUNTER — Encounter: Payer: Self-pay | Admitting: Internal Medicine

## 2022-09-10 ENCOUNTER — Emergency Department: Payer: Medicaid Other

## 2022-09-10 ENCOUNTER — Encounter: Payer: Medicaid Other | Admitting: *Deleted

## 2022-09-10 ENCOUNTER — Inpatient Hospital Stay
Admission: EM | Admit: 2022-09-10 | Discharge: 2022-09-13 | DRG: 312 | Disposition: A | Discharge status: Home / Self Care | Payer: Medicaid Other | Attending: Internal Medicine | Admitting: Internal Medicine

## 2022-09-10 ENCOUNTER — Other Ambulatory Visit: Payer: Self-pay

## 2022-09-10 ENCOUNTER — Other Ambulatory Visit (HOSPITAL_COMMUNITY): Payer: Self-pay

## 2022-09-10 VITALS — BP 98/80 | HR 66 | Ht 70.0 in | Wt 227.1 lb

## 2022-09-10 DIAGNOSIS — Z886 Allergy status to analgesic agent status: Secondary | ICD-10-CM

## 2022-09-10 DIAGNOSIS — I2582 Chronic total occlusion of coronary artery: Secondary | ICD-10-CM | POA: Diagnosis present

## 2022-09-10 DIAGNOSIS — N170 Acute kidney failure with tubular necrosis: Secondary | ICD-10-CM | POA: Diagnosis present

## 2022-09-10 DIAGNOSIS — I10 Essential (primary) hypertension: Secondary | ICD-10-CM | POA: Diagnosis not present

## 2022-09-10 DIAGNOSIS — I25110 Atherosclerotic heart disease of native coronary artery with unstable angina pectoris: Secondary | ICD-10-CM | POA: Diagnosis present

## 2022-09-10 DIAGNOSIS — K50918 Crohn's disease, unspecified, with other complication: Secondary | ICD-10-CM | POA: Diagnosis not present

## 2022-09-10 DIAGNOSIS — K5090 Crohn's disease, unspecified, without complications: Secondary | ICD-10-CM | POA: Diagnosis present

## 2022-09-10 DIAGNOSIS — G8929 Other chronic pain: Secondary | ICD-10-CM | POA: Diagnosis present

## 2022-09-10 DIAGNOSIS — R072 Precordial pain: Secondary | ICD-10-CM | POA: Diagnosis not present

## 2022-09-10 DIAGNOSIS — Z6832 Body mass index (BMI) 32.0-32.9, adult: Secondary | ICD-10-CM | POA: Diagnosis not present

## 2022-09-10 DIAGNOSIS — I2489 Other forms of acute ischemic heart disease: Secondary | ICD-10-CM | POA: Diagnosis not present

## 2022-09-10 DIAGNOSIS — E669 Obesity, unspecified: Secondary | ICD-10-CM | POA: Diagnosis not present

## 2022-09-10 DIAGNOSIS — I5022 Chronic systolic (congestive) heart failure: Secondary | ICD-10-CM

## 2022-09-10 DIAGNOSIS — R079 Chest pain, unspecified: Secondary | ICD-10-CM | POA: Diagnosis not present

## 2022-09-10 DIAGNOSIS — R519 Headache, unspecified: Secondary | ICD-10-CM | POA: Diagnosis not present

## 2022-09-10 DIAGNOSIS — K435 Parastomal hernia without obstruction or  gangrene: Secondary | ICD-10-CM | POA: Diagnosis present

## 2022-09-10 DIAGNOSIS — Z7982 Long term (current) use of aspirin: Secondary | ICD-10-CM

## 2022-09-10 DIAGNOSIS — I25118 Atherosclerotic heart disease of native coronary artery with other forms of angina pectoris: Secondary | ICD-10-CM

## 2022-09-10 DIAGNOSIS — N1832 Chronic kidney disease, stage 3b: Secondary | ICD-10-CM | POA: Diagnosis not present

## 2022-09-10 DIAGNOSIS — T447X5A Adverse effect of beta-adrenoreceptor antagonists, initial encounter: Secondary | ICD-10-CM | POA: Diagnosis present

## 2022-09-10 DIAGNOSIS — I952 Hypotension due to drugs: Principal | ICD-10-CM | POA: Diagnosis present

## 2022-09-10 DIAGNOSIS — I2721 Secondary pulmonary arterial hypertension: Secondary | ICD-10-CM | POA: Diagnosis present

## 2022-09-10 DIAGNOSIS — E860 Dehydration: Secondary | ICD-10-CM | POA: Diagnosis not present

## 2022-09-10 DIAGNOSIS — N179 Acute kidney failure, unspecified: Secondary | ICD-10-CM | POA: Diagnosis not present

## 2022-09-10 DIAGNOSIS — Z933 Colostomy status: Secondary | ICD-10-CM | POA: Diagnosis not present

## 2022-09-10 DIAGNOSIS — Z8249 Family history of ischemic heart disease and other diseases of the circulatory system: Secondary | ICD-10-CM

## 2022-09-10 DIAGNOSIS — Z87891 Personal history of nicotine dependence: Secondary | ICD-10-CM

## 2022-09-10 DIAGNOSIS — E875 Hyperkalemia: Secondary | ICD-10-CM | POA: Diagnosis not present

## 2022-09-10 DIAGNOSIS — Z888 Allergy status to other drugs, medicaments and biological substances status: Secondary | ICD-10-CM

## 2022-09-10 DIAGNOSIS — R001 Bradycardia, unspecified: Secondary | ICD-10-CM | POA: Diagnosis present

## 2022-09-10 DIAGNOSIS — R42 Dizziness and giddiness: Secondary | ICD-10-CM | POA: Diagnosis not present

## 2022-09-10 DIAGNOSIS — Z883 Allergy status to other anti-infective agents status: Secondary | ICD-10-CM

## 2022-09-10 DIAGNOSIS — Z79899 Other long term (current) drug therapy: Secondary | ICD-10-CM

## 2022-09-10 DIAGNOSIS — K591 Functional diarrhea: Secondary | ICD-10-CM | POA: Diagnosis not present

## 2022-09-10 DIAGNOSIS — K5010 Crohn's disease of large intestine without complications: Secondary | ICD-10-CM | POA: Diagnosis not present

## 2022-09-10 DIAGNOSIS — N1831 Chronic kidney disease, stage 3a: Secondary | ICD-10-CM | POA: Diagnosis present

## 2022-09-10 DIAGNOSIS — E861 Hypovolemia: Secondary | ICD-10-CM | POA: Diagnosis not present

## 2022-09-10 DIAGNOSIS — I959 Hypotension, unspecified: Secondary | ICD-10-CM | POA: Diagnosis not present

## 2022-09-10 DIAGNOSIS — Z800 Family history of malignant neoplasm of digestive organs: Secondary | ICD-10-CM

## 2022-09-10 DIAGNOSIS — I77810 Thoracic aortic ectasia: Secondary | ICD-10-CM | POA: Diagnosis present

## 2022-09-10 DIAGNOSIS — Z7984 Long term (current) use of oral hypoglycemic drugs: Secondary | ICD-10-CM

## 2022-09-10 DIAGNOSIS — R062 Wheezing: Secondary | ICD-10-CM | POA: Diagnosis present

## 2022-09-10 DIAGNOSIS — I130 Hypertensive heart and chronic kidney disease with heart failure and stage 1 through stage 4 chronic kidney disease, or unspecified chronic kidney disease: Secondary | ICD-10-CM | POA: Diagnosis not present

## 2022-09-10 DIAGNOSIS — Z7902 Long term (current) use of antithrombotics/antiplatelets: Secondary | ICD-10-CM

## 2022-09-10 DIAGNOSIS — Z9049 Acquired absence of other specified parts of digestive tract: Secondary | ICD-10-CM

## 2022-09-10 LAB — CBC
HCT: 47 % (ref 39.0–52.0)
Hemoglobin: 15.5 g/dL (ref 13.0–17.0)
MCH: 27.8 pg (ref 26.0–34.0)
MCHC: 33 g/dL (ref 30.0–36.0)
MCV: 84.2 fL (ref 80.0–100.0)
Platelets: 273 10*3/uL (ref 150–400)
RBC: 5.58 MIL/uL (ref 4.22–5.81)
RDW: 13.7 % (ref 11.5–15.5)
WBC: 9.1 10*3/uL (ref 4.0–10.5)
nRBC: 0 % (ref 0.0–0.2)

## 2022-09-10 LAB — BASIC METABOLIC PANEL
Anion gap: 5 (ref 5–15)
BUN: 36 mg/dL — ABNORMAL HIGH (ref 6–20)
CO2: 23 mmol/L (ref 22–32)
Calcium: 9.8 mg/dL (ref 8.9–10.3)
Chloride: 109 mmol/L (ref 98–111)
Creatinine, Ser: 2.28 mg/dL — ABNORMAL HIGH (ref 0.61–1.24)
GFR, Estimated: 33 mL/min — ABNORMAL LOW (ref >60)
Glucose, Bld: 123 mg/dL — ABNORMAL HIGH (ref 70–99)
Potassium: 5.3 mmol/L — ABNORMAL HIGH (ref 3.5–5.1)
Sodium: 137 mmol/L (ref 135–145)

## 2022-09-10 LAB — MAGNESIUM: Magnesium: 2.2 mg/dL (ref 1.7–2.4)

## 2022-09-10 LAB — SEDIMENTATION RATE: Sed Rate: 1 mm/hr (ref 0–20)

## 2022-09-10 MED ORDER — ACETAMINOPHEN 325 MG PO TABS
650.0000 mg | ORAL_TABLET | Freq: Four times a day (QID) | ORAL | Status: DC | PRN
  Administered 2022-09-11 – 2022-09-12 (×2): 650 mg via ORAL
  Filled 2022-09-10 (×2): qty 2

## 2022-09-10 MED ORDER — MORPHINE SULFATE (PF) 2 MG/ML IV SOLN
2.0000 mg | INTRAVENOUS | Status: DC | PRN
  Administered 2022-09-11: 2 mg via INTRAVENOUS
  Filled 2022-09-10: qty 1

## 2022-09-10 MED ORDER — ENTRESTO 97-103 MG PO TABS
0.5000 | ORAL_TABLET | Freq: Two times a day (BID) | ORAL | 3 refills | Status: DC
  Filled 2022-09-10: qty 30, 30d supply, fill #0

## 2022-09-10 MED ORDER — SODIUM CHLORIDE 0.9% FLUSH
3.0000 mL | Freq: Two times a day (BID) | INTRAVENOUS | Status: DC
  Administered 2022-09-11 – 2022-09-13 (×4): 3 mL via INTRAVENOUS

## 2022-09-10 MED ORDER — HEPARIN SODIUM (PORCINE) 5000 UNIT/ML IJ SOLN
5000.0000 [IU] | Freq: Two times a day (BID) | INTRAMUSCULAR | Status: DC
  Administered 2022-09-11: 5000 [IU] via SUBCUTANEOUS
  Filled 2022-09-10: qty 1

## 2022-09-10 MED ORDER — CARVEDILOL 12.5 MG PO TABS
12.5000 mg | ORAL_TABLET | Freq: Two times a day (BID) | ORAL | 3 refills | Status: DC
  Filled 2022-09-10 – 2022-09-18 (×3): qty 180, 90d supply, fill #0

## 2022-09-10 MED ORDER — SODIUM CHLORIDE 0.9 % IV BOLUS
1000.0000 mL | Freq: Once | INTRAVENOUS | Status: AC
  Administered 2022-09-10: 1000 mL via INTRAVENOUS

## 2022-09-10 MED ORDER — LACTATED RINGERS IV SOLN: INTRAVENOUS | Status: DC

## 2022-09-10 MED ORDER — ACETAMINOPHEN 650 MG RE SUPP: 650.0000 mg | Freq: Four times a day (QID) | RECTAL | Status: DC | PRN

## 2022-09-10 NOTE — Assessment & Plan Note (Signed)
Strict I's and O's. Restart patient's GDMT tomorrow morning as deemed appropriate. Patient is euvolemic. No edema no JVD no shortness of breath no pleural effusion.

## 2022-09-10 NOTE — Assessment & Plan Note (Signed)
Currently we will hold patient's Coreg, Lasix, Entresto, Aldactone. Medications to be increased titrated up once patient is no longer hypotensive.  We will check orthostatic vitals prior to reinitiation of medications. Cardiology consult as deemed appropriate.

## 2022-09-10 NOTE — Assessment & Plan Note (Signed)
Lab Results  Component Value Date   CREATININE 2.28 (H) 09/10/2022   CREATININE 1.53 (H) 09/07/2022   CREATININE 1.37 (H) 07/15/2022  Currently hold patient's Entresto Aldactone Lasix and Coreg to correct patient's blood pressure and prerenal etiology causing AKI.

## 2022-09-10 NOTE — ED Provider Notes (Signed)
[facility] Provider Note    Event Date/Time   First MD Initiated Contact with Patient 09/10/22 2154     (approximate)   History   No chief complaint on file.   HPI  Clarence [name] is a 52 y.o. male past medical history significant for hypertension, CHF, Crohn's disease with prior colectomy, who presents to the emergency department with hypotension and dehydration concerns.  Patient states that he was evaluated last week in the emergency department and diagnosed with dehydration after he had an episode of hypotension.  Significant amount of GI losses from ongoing diarrhea.  Recent antibiotic use with penicillin for a possible dental infection.  Evaluated yesterday at PT and was unable to finish the session secondary to hypotension.  Evaluated today with cardiologist Dr. Okey Dupre who recommended decreasing his antihypertensive medications.  Patient had ongoing symptoms of generalized weakness and fatigue which brought him into the emergency department.  No blood in his stool.  Does not feel that this is a Crohn's flare.     Physical Exam   Triage Vital Signs: ED Triage Vitals [09/10/22 1843]  Enc Vitals Group     BP 90/63     Pulse Rate 80     Resp 18     Temp 97.9 F (36.6 C)     Temp src      SpO2 97 %     Weight      Height      Head Circumference      Peak Flow      Pain Score 0     Pain Loc      Pain Edu?      Excl. in GC?     Most recent vital signs: Vitals:   09/10/22 2315 09/10/22 2317  BP: 115/77   Pulse: (!) 50   Resp: 16   Temp:  98 F (36.7 C)  SpO2: 98%     Physical Exam Constitutional:      Appearance: He is well-developed.  HENT:     Head: Atraumatic.  Eyes:     Conjunctiva/sclera: Conjunctivae normal.  Cardiovascular:     Rate and Rhythm: Regular rhythm.  Pulmonary:     Effort: No respiratory distress.  Abdominal:     Tenderness: There is no abdominal tenderness.     Comments: Colostomy with formed green stool   Musculoskeletal:        General: Normal range of motion.     Cervical back: Normal range of motion.  Skin:    General: Skin is warm.     Capillary Refill: Capillary refill takes less than 2 seconds.  Neurological:     Mental Status: He is alert. Mental status is at baseline.     IMPRESSION / MDM / ASSESSMENT AND PLAN / ED COURSE  I reviewed the triage vital signs and the nursing notes.  Differential diagnosis including dehydration from diarrhea, anemia, Crohn's flare, medication side effect, electrolyte abnormality  EKG  I, Corena Herter, the attending physician, personally viewed and interpreted this ECG.   Rate: Normal  Rhythm: Normal sinus  Axis: Normal  Intervals: LVH  ST&T Change: None No acute changes from prior.   No tachycardic or bradycardic dysrhythmias while on cardiac telemetry.  RADIOLOGY I independently reviewed imaging, my interpretation of imaging: CT scan without findings of small bowel obstruction.  No acute findings on my review.  LABS (all labs ordered are listed, but only abnormal results are displayed) Labs interpreted as -  Labs Reviewed  BASIC METABOLIC PANEL - Abnormal; Notable for the following components:      Result Value   Potassium 5.3 (*)    Glucose, Bld 123 (*)    BUN 36 (*)    Creatinine, Ser 2.28 (*)    GFR, Estimated 33 (*)    All other components within normal limits  C DIFFICILE QUICK SCREEN W PCR REFLEX    GASTROINTESTINAL PANEL BY PCR, STOOL (REPLACES STOOL CULTURE)  CBC  MAGNESIUM  C-REACTIVE PROTEIN  SEDIMENTATION RATE     MDM    Lab work with acute kidney injury.  Potassium mildly elevated but no EKG changes.  Abdomen is nontender to palpation.  Added on C. difficile and GI pathogen panel given recent antibiotic use.  Concern for acute kidney injury secondary to dehydration from diarrhea.  Consulted hospitalist for admission.   PROCEDURES:  Critical Care performed: No  Procedures  Patient's presentation  is most consistent with acute presentation with potential threat to life or bodily function.   MEDICATIONS ORDERED IN ED: Medications  sodium chloride 0.9 % bolus 1,000 mL (1,000 mLs Intravenous New Bag/Given 09/10/22 2315)    FINAL CLINICAL IMPRESSION(S) / ED DIAGNOSES   Final diagnoses:  AKI (acute kidney injury) (HCC)     Rx / DC Orders   ED Discharge Orders     None        Note:  This document was prepared using Dragon voice recognition software and may include unintentional dictation errors.   Corena Herter, MD 09/10/22 2329

## 2022-09-10 NOTE — Assessment & Plan Note (Signed)
Stable. No complaints of chest discomfort or anginal equivalent symptoms. Will resume medications once patient is stable.

## 2022-09-10 NOTE — ED Triage Notes (Signed)
Pt to ED for hypotension, reports started feeling fatigued so took bp at home and read low. Was seen in ED a couple days ago for same and given fluids. Was informed to hold some of his medications and states he did.  Endorses dizziness, nausea and shob.  Pt reports has CHF.

## 2022-09-10 NOTE — Progress Notes (Signed)
Follow-up Outpatient Visit Date: 09/10/2022  Primary Care Provider: Caesar Bookman, NP [address] [place] [address]  Chief Complaint: Dizziness  HPI:  Mr. Clarence [name] is a 52 y.o. male with history of CAD, mixed ischemic and nonischemic cardiomyopathy, chronic HFrEF (35 to 40%, May 2023), pulmonary arterial hypertension, stage II chronic kidney disease, hypertension, hyperlipidemia, and Crohn's disease status post colectomy with ostomy, who presents for follow-up of dizziness in the setting of chronic HFrEF and increased ostomy output.  Today, Mr. [name] reports that he has not been doing very well over the last few weeks.  He attributes this to low blood pressure and increased ostomy output.  He was recently seen in the ED, where he received IV fluids.  Furosemide has also been on hold.  He transiently felt better but is starting to feel more lightheaded again.  He also has "bad acid reflux" that has not responded well to pantoprazole.  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  At times, his ostomy output appears limegreen.  He was also recently diagnosed with an infection along his hard palate for which she was prescribed penicillin.  However rather than taking the course as prescribed, he was rationing it in an effort to lengthen the duration of his penicillin treatment (taking a capsule every few days).  He notes occasional tightness in his chest, usually when lying down at night.  He has not had any exertional chest pain.  He continues to feel short of breath with mild exertion.  He has not had any significant edema or palpitations.  --------------------------------------------------------------------------------------------------  Past Medical History:  Diagnosis Date   Ascending Aortic Dilation    a. 09/2021 Echo: Asc Ao 40mm.   CAD (coronary artery disease)    a. 07/2021 Cath: LM nl, LAD 61m, D2 40, LCX 65p/m, OM1 90,  RCA 120m w/ L-.R collats to RPDA from septal 1/2-->Med rx.   Chronic HFrEF (heart failure with reduced ejection fraction) (HCC)    a. 07/2021 Echo: EF 30-35%, glob HK, mod LVH, GrII DD, nl RV fxn, mildly dil LA, mild-mod MR; b. 09/2021 Echo: EF 35-40%, glob HK, mod LVH, GrI DD, nl RV fxn, mildly dil RA, mild MR, Asc Ao 40mm.   CKD (chronic kidney disease) stage 2, GFR 60-89 ml/min    Crohn's disease (HCC)    Hypertension    Mixed Ischemic & Nonischemic cardiomyopathy    a. 07/2021 Echo: EF 30-35%; b. 07/2021 Cath: Occluded RCA w/ mod LAD/LCX dzs, and severe OM1 dzs-->Med Rx; c. 09/2021 Echo: EF 35-40%.   PAH (pulmonary artery hypertension) (HCC)    a. 07/2021 RHC: PA 67/30 (42).   Proteinuria    Past Surgical History:  Procedure Laterality Date   COLECTOMY     RIGHT/LEFT HEART CATH AND CORONARY ANGIOGRAPHY N/A 08/07/2021   Procedure: RIGHT/LEFT HEART CATH AND CORONARY ANGIOGRAPHY;  Surgeon: Yvonne Kendall, MD;  Location: [facility] INVASIVE CV [facility];  Service: Cardiovascular;  Laterality: N/A;    Current Meds  Medication Sig   acetaminophen (TYLENOL) 325 MG tablet Take 650 mg by mouth at bedtime.   aspirin EC 81 MG tablet Take 1 tablet (81 mg total) by mouth daily. Swallow whole.   atorvastatin (LIPITOR) 80 MG tablet Take 1 tablet (80 mg total) by mouth every evening.   carvedilol (COREG) 12.5 MG tablet Take 1&1/2 tablets (18.75 mg total) by mouth 2 (two) times daily with a meal.  clopidogrel (PLAVIX) 75 MG tablet Take 1 tablet (75 mg total) by mouth daily.   empagliflozin (JARDIANCE) 10 MG TABS tablet Take 10 mg by mouth daily.   ezetimibe (ZETIA) 10 MG tablet Take 1 tablet (10 mg total) by mouth daily.   famotidine (PEPCID) 10 MG tablet Take 10 mg by mouth at bedtime. As needed   fenofibrate (TRICOR) 48 MG tablet Take 1 tablet (48 mg total) by mouth daily.   nitroGLYCERIN (NITROSTAT) 0.4 MG SL tablet Place 1 tablet (0.4 mg total) under the tongue every 5 (five) minutes as needed for chest pain.    ondansetron (ZOFRAN) 4 MG tablet Take 4 mg by mouth every 8 (eight) hours as needed for nausea or vomiting.   sacubitril-valsartan (ENTRESTO) 97-103 MG Take 1 tablet by mouth 2 (two) times daily.   spironolactone (ALDACTONE) 25 MG tablet Take 12.5 mg by mouth daily.    Allergies: Ibuprofen, Spironolactone, Sulfacetamide, and Neomycin-bacitracin zn-polymyx  Social History   Tobacco Use   Smoking status: Former    Types: Cigars    Quit date: 08/13/2021    Years since quitting: 1.0  Vaping Use   Vaping Use: Never used  Substance Use Topics   Alcohol use: Not Currently    Alcohol/week: 1.0 standard drink of alcohol    Types: 1 Standard drinks or equivalent per week   Drug use: Never    Family History  Problem Relation Age of Onset   Hypertension Mother    Cancer Maternal Aunt    Heart disease Maternal Uncle    Pancreatic cancer Maternal Uncle     Review of Systems: A 12-system review of systems was performed and was negative except as noted in the HPI.  --------------------------------------------------------------------------------------------------  Physical Exam: BP 98/80 (BP Location: Right Arm, Patient Position: Sitting, Cuff Size: Normal)   Pulse 66   Ht 5\' 10"  (1.778 m)   Wt 227 lb 2 oz (103 kg)   SpO2 98%   BMI 32.59 kg/m   General: Chronically ill-appearing man. Neck: No JVD or HJR. Lungs: Clear to auscultation bilaterally without wheezes or crackles. Heart: Regular rate and rhythm without murmurs, rubs, or gallops. Abdomen: Soft, nontender, nondistended.  Right-sided ostomy with scant amount of greenish output.  Parastomal hernia noted without tenderness. Extremities: No lower extremity edema.  EKG:  Normal sinus rhythm with inferior infarct and inferolateral ST/T abnormalities.  No significant change from prior tracing on 09/07/2022.  Lab Results  Component Value Date   WBC 7.5 09/07/2022   HGB 15.8 09/07/2022   HCT 47.6 09/07/2022   MCV 83.8  09/07/2022   PLT 242 09/07/2022    Lab Results  Component Value Date   NA 139 09/07/2022   K 4.6 09/07/2022   CL 107 09/07/2022   CO2 24 09/07/2022   BUN 26 (H) 09/07/2022   CREATININE 1.53 (H) 09/07/2022   GLUCOSE 96 09/07/2022   ALT 31 09/07/2022    Lab Results  Component Value Date   CHOL 147 07/15/2022   HDL 39 (L) 07/15/2022   LDLCALC 81 07/15/2022   LDLDIRECT 69.0 09/09/2021   TRIG 177 (H) 07/15/2022   CHOLHDL 3.8 07/15/2022    --------------------------------------------------------------------------------------------------  ASSESSMENT AND PLAN: Chronic HFrEF: Mr. Lavonna Rua has stable NYHA class III HF symptoms and has been bothered mostly by dizziness and soft BP in the setting of increased ostomy output.  Furosemide already on hold at this time.  Borderline AKI noted at ED visit three days ago.  I have recommended  decreasing carvedilol to 12.5 mg twice daily and splitting his Entresto 97-103 tablets in half (continue taking twice daily).  We will continue to hold furosemide.  I advised him to go to the ED again if his lightheadedness worsens.  Crohn's disease with high ostomy output and abdominal pain: This has been going on at least a week.  I worry that this could be indicative of GI infection or a Crohn's flare.  Mr. Mercier admits that he has not seen his gastroenterologist at [facility] in at least 10 years.  Parastomal hernia is also a concern, though with lack of surrounding tenderness and continued ostomy output, I do not believe that obstructed bowel is a concern at this time.  I encouraged Mr. Pons to drink plenty of fluid and to contact his former gastroenterologist as soon as possible.  We will also place an urgent local GI referral.  Coronary artery disease: Intermittent atypical chest pain remains stable.  Continue medical therapy.  Follow-up: Return to heart failure clinic in ~2 weeks; return to see me or APP in ~2 months.  Yvonne Kendall,  MD 09/10/2022 2:53 PM

## 2022-09-10 NOTE — Assessment & Plan Note (Signed)
GI consult. Steroids if abd pain or any other symptoms.

## 2022-09-10 NOTE — ED Notes (Signed)
Patient transported to CT 

## 2022-09-10 NOTE — Assessment & Plan Note (Addendum)
Secondary to hypotension and hypovolemia from medications along with diarrhea. Aspiration/ fall precaution.

## 2022-09-10 NOTE — H&P (Addendum)
History and Physical     Patient: Clarence [name] BMW:[id number] DOB: [birth year] DOA: 09/10/2022 DOS: the patient was seen and examined on 09/10/2022 PCP: Ngetich, Donalee Citrin, NP   Patient coming from: Home  Chief Complaint: Dizziness  HISTORY OF PRESENT ILLNESS: Clarence [name] is an 52 y.o. male seen in the emergency room today brought by EMS for dizziness. Patient has been dizzy short of breath weak nauseated with past few weeks.  Has a history of Crohn's disease and the last time he had a colonoscopy was in 2004.  Patient has loose stools in his colostomy bag as well on the stool as brown and no blood noted.  Patient does not take any NSAIDs.  He did report nausea but no vomiting.  Patient did come to the emergency room few days ago where he was seen and evaluated given IV fluids.  Earlier today patient was seen by his cardiologist Dr. And who lowered his blood pressure medications.  As patient has a history of heart failure heart disease pulmonary artery hypertension.  Patient does not report any abdominal pain and feels that it is a Crohn's flare.  I requested a CT of the abdomen and pelvis noncontrast.  Due to his AKI.  Discussed with patient about evaluation of his severe diarrhea persistent hypotension and prostatic symptoms evaluation by GI In AM.  Past Medical History:  Diagnosis Date   Ascending Aortic Dilation    a. 09/2021 Echo: Asc Ao 40mm.   CAD (coronary artery disease)    a. 07/2021 Cath: LM nl, LAD 64m, D2 40, LCX 65p/m, OM1 90, RCA 154m w/ L-.R collats to RPDA from septal 1/2-->Med rx.   Chronic HFrEF (heart failure with reduced ejection fraction) (HCC)    a. 07/2021 Echo: EF 30-35%, glob HK, mod LVH, GrII DD, nl RV fxn, mildly dil LA, mild-mod MR; b. 09/2021 Echo: EF 35-40%, glob HK, mod LVH, GrI DD, nl RV fxn, mildly dil RA, mild MR, Asc Ao 40mm.   CKD (chronic kidney disease) stage 2, GFR 60-89 ml/min    Crohn's disease (HCC)    Hypertension    Mixed Ischemic &  Nonischemic cardiomyopathy    a. 07/2021 Echo: EF 30-35%; b. 07/2021 Cath: Occluded RCA w/ mod LAD/LCX dzs, and severe OM1 dzs-->Med Rx; c. 09/2021 Echo: EF 35-40%.   PAH (pulmonary artery hypertension) (HCC)    a. 07/2021 RHC: PA 67/30 (42).   Proteinuria    Review of Systems  Constitutional:  Positive for fatigue.  Gastrointestinal:  Positive for diarrhea and nausea.  Neurological:  Positive for dizziness and weakness.   Allergies  Allergen Reactions   Ibuprofen Hives and Other (See Comments)    Other reaction(s): GI Upset (intolerance) Hives Tolerates Advil  Other reaction(s): Not available   Spironolactone     Hyperkalemia   Sulfacetamide Other (See Comments)   Neomycin-Bacitracin Zn-Polymyx     Other reaction(s): Other (See Comments)   Past Surgical History:  Procedure Laterality Date   COLECTOMY     RIGHT/LEFT HEART CATH AND CORONARY ANGIOGRAPHY N/A 08/07/2021   Procedure: RIGHT/LEFT HEART CATH AND CORONARY ANGIOGRAPHY;  Surgeon: Yvonne Kendall, MD;  Location: [facility] INVASIVE CV LAB;  Service: Cardiovascular;  Laterality: N/A;   MEDICATIONS: Prior to Admission medications   Medication Sig Start Date End Date Taking? Authorizing Provider  acetaminophen (TYLENOL) 325 MG tablet Take 650 mg by mouth at bedtime.    [provider]  aspirin EC 81 MG tablet Take 1 tablet (81  mg total) by mouth daily. Swallow whole. 05/20/22   Milford, Anderson Malta, [name]  atorvastatin (LIPITOR) 80 MG tablet Take 1 tablet (80 mg total) by mouth every evening. 05/20/22   Milford, Anderson Malta, [name]  carvedilol (COREG) 12.5 MG tablet Take 1 tablet (12.5 mg total) by mouth 2 (two) times daily with a meal. 09/10/22   End, Cristal Deer, MD  clopidogrel (PLAVIX) 75 MG tablet Take 1 tablet (75 mg total) by mouth daily. 05/20/22   Milford, Anderson Malta, [name]  empagliflozin (JARDIANCE) 10 MG TABS tablet Take 10 mg by mouth daily.    [provider]  ezetimibe (ZETIA) 10 MG tablet Take 1 tablet (10 mg total) by  mouth daily. 11/19/21   Dunn, Raymon Mutton, PA-C  famotidine (PEPCID) 10 MG tablet Take 10 mg by mouth at bedtime. As needed    [provider]  fenofibrate (TRICOR) 48 MG tablet Take 1 tablet (48 mg total) by mouth daily. 05/20/22   Milford, Anderson Malta, [name]  furosemide (LASIX) 20 MG tablet Take 20 mg by mouth daily as needed. For weight gain of 3 lb in 24 hours or 5 lbs in one week Patient not taking: Reported on 09/10/2022    [provider]  nitroGLYCERIN (NITROSTAT) 0.4 MG SL tablet Place 1 tablet (0.4 mg total) under the tongue every 5 (five) minutes as needed for chest pain. 07/29/22   Ngetich, Dinah C, NP  ondansetron (ZOFRAN) 4 MG tablet Take 4 mg by mouth every 8 (eight) hours as needed for nausea or vomiting.    [provider]  sacubitril-valsartan (ENTRESTO) 97-103 MG Take 1/2 tablet by mouth 2 (two) times daily. 09/10/22   End, Cristal Deer, MD  spironolactone (ALDACTONE) 25 MG tablet Take 12.5 mg by mouth daily.    [provider]   ED Course: Pt in Ed is alert awake oriented gives history afebrile. Vitals:   09/10/22 1843 09/10/22 2315 09/10/22 2317  BP: 90/63 115/77   Pulse: 80 (!) 50   Resp: 18 16   Temp: 97.9 F (36.6 C)  98 F (36.7 C)  TempSrc:   Oral  SpO2: 97% 98%    No intake/output data recorded. SpO2: 98 % Blood work in ed shows: Hyperkalemia 5.23 AKI of 2.28 and EGFR of 33. LFTs pending. Normal CBC. 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  Results for orders placed or performed during the hospital encounter of 09/10/22 (from the past 72 hour(s))  CBC     Status: None   Collection Time: 09/10/22  6:46 PM  Result Value Ref Range   WBC 9.1 4.0 - 10.5 K/uL   RBC 5.58 4.22 - 5.81 MIL/uL   Hemoglobin 15.5 13.0 - 17.0 g/dL   HCT 16.1 09.6 - 04.5 %   MCV 84.2 80.0 - 100.0 fL   MCH 27.8 26.0 -  34.0 pg   MCHC 33.0 30.0 - 36.0 g/dL   RDW 40.9 81.1 - 91.4 %   Platelets 273 150 - 400 K/uL   nRBC 0.0 0.0 - 0.2 %    Comment: Performed at [facility], [address], [place], [place] [address]  Basic metabolic panel     Status: Abnormal   Collection Time: 09/10/22  6:46 PM  Result Value Ref Range   Sodium 137 135 - 145 mmol/L   Potassium 5.3 (H) 3.5 - 5.1  mmol/L   Chloride 109 98 - 111 mmol/L   CO2 23 22 - 32 mmol/L   Glucose, Bld 123 (H) 70 - 99 mg/dL    Comment: Glucose reference range applies only to samples taken after fasting for at least 8 hours.   BUN 36 (H) 6 - 20 mg/dL   Creatinine, Ser 0.98 (H) 0.61 - 1.24 mg/dL   Calcium 9.8 8.9 - 11.9 mg/dL   GFR, Estimated 33 (L) >60 mL/min    Comment: (NOTE) Calculated using the CKD-EPI Creatinine Equation (2021)    Anion gap 5 5 - 15    Comment: Performed at [facility], [address] [place], [place] [address]  Magnesium     Status: None   Collection Time: 09/10/22  6:46 PM  Result Value Ref Range   Magnesium 2.2 1.7 - 2.4 mg/dL    Comment: Performed at [facility], [address] [place], [place] [address]  Sedimentation rate     Status: None   Collection Time: 09/10/22 11:16 PM  Result Value Ref Range   Sed Rate 1 0 - 20 mm/hr    Comment: Performed at [facility], [address] [place], [place] [address]    Lab Results  Component Value Date   CREATININE 2.28 (H) 09/10/2022   CREATININE 1.53 (H) 09/07/2022   CREATININE 1.37 (H) 07/15/2022      Latest Ref Rng & Units 09/10/2022    6:46 PM 09/07/2022    5:25 PM 07/15/2022    2:25 PM  CMP  Glucose 70 - 99 mg/dL 784  96  696   BUN 6 - 20 mg/dL 36  26  17   Creatinine 0.61 - 1.24 mg/dL 2.95  2.84  1.32   Sodium 135 - 145 mmol/L 137  139  143   Potassium 3.5 - 5.1 mmol/L 5.3  4.6  4.7   Chloride 98 - 111 mmol/L 109  107  107   CO2 22 - 32 mmol/L Calcium 8.9 - 10.3 mg/dL 9.8  9.7  9.9   Total Protein 6.5 -  8.1 g/dL  7.6  7.0   Total Bilirubin 0.3 - 1.2 mg/dL  0.8  0.6   Alkaline Phos 38 - 126 U/L  92    AST 15 - 41 U/L  26  26   ALT 0 - 44 U/L  31  42    Unresulted Labs (From admission, onward)     Start     Ordered   09/11/22 0500  Comprehensive metabolic panel  Tomorrow morning,   STAT        09/10/22 2338   09/11/22 0500  CBC  Tomorrow morning,   STAT        09/10/22 2338   09/10/22 2336  HIV Antibody (routine testing w rflx)  (HIV Antibody (Routine testing w reflex) panel)  Once,   URGENT        09/10/22 2338   09/10/22 2251  C-reactive protein  Add-on,   AD        09/10/22 2250   09/10/22 2249  C Difficile Quick Screen w PCR reflex  (C Difficile quick screen w PCR reflex panel )  Once, for 24 hours,   URGENT       References:    CDiff Information Tool   09/10/22 2250   09/10/22 2249  Gastrointestinal Panel by PCR , Stool  (Gastrointestinal Panel by PCR, Stool                                                                                                                                                     **  Does Not include CLOSTRIDIUM DIFFICILE testing. **If CDIFF testing is needed, place order from the "C Difficile Testing" order set.**)  Once,   URGENT        09/10/22 2250           Pt has received : Orders Placed This Encounter  Procedures   C Difficile Quick Screen w PCR reflex    Standing Status:   Standing    Number of Occurrences:   1   Gastrointestinal Panel by PCR , Stool    Standing Status:   Standing    Number of Occurrences:   1   DG Chest 2 View    Standing Status:   Standing    Number of Occurrences:   1    Order Specific Question:   Reason for Exam (SYMPTOM  OR DIAGNOSIS REQUIRED)    Answer:   shob   CT ABDOMEN PELVIS WO CONTRAST    Standing Status:   Standing    Number of Occurrences:   1    Order Specific Question:   If indicated for the ordered procedure, I authorize the administration of oral contrast media per Radiology protocol    Answer:   Yes     Order Specific Question:   Does the patient have a contrast media/X-ray dye allergy?    Answer:   No   CBC    Standing Status:   Standing    Number of Occurrences:   1   Basic metabolic panel    Standing Status:   Standing    Number of Occurrences:   1   Magnesium    Standing Status:   Standing    Number of Occurrences:   1   C-reactive protein    Standing Status:   Standing    Number of Occurrences:   1   Sedimentation rate    Standing Status:   Standing    Number of Occurrences:   1   HIV Antibody (routine testing w rflx)    Standing Status:   Standing    Number of Occurrences:   1   Comprehensive metabolic panel    Standing Status:   Standing    Number of Occurrences:   1   CBC    Standing Status:   Standing    Number of Occurrences:   1   Diet NPO time specified Except for: Sips with Meds    Standing Status:   Standing    Number of Occurrences:   1    Order Specific Question:   Except for    Answer:   Sips with Meds   Maintain IV access    Standing Status:   Standing    Number of Occurrences:   1   Vital signs    Standing Status:   Standing    Number of Occurrences:   1   Notify physician (specify)    Standing Status:   Standing    Number of Occurrences:   20    Order Specific Question:   Notify Physician    Answer:   for pulse less than 55 or greater than 120    Order Specific Question:   Notify Physician    Answer:   for respiratory rate less than 12 or greater than 25    Order Specific Question:   Notify Physician    Answer:   for temperature greater than 100.5 F    Order Specific Question:   Notify Physician  Answer:   for urinary output less than 30 mL/hr for four hours    Order Specific Question:   Notify Physician    Answer:   for systolic BP less than 90 or greater than 160, diastolic BP less than 60 or greater than 100    Order Specific Question:   Notify Physician    Answer:   for new hypoxia w/ oxygen saturations < 88%   Progressive Mobility  Protocol: No Restrictions    Standing Status:   Standing    Number of Occurrences:   1   Daily weights    Standing Status:   Standing    Number of Occurrences:   1   Intake and Output    Standing Status:   Standing    Number of Occurrences:   1   Do not place and if present remove PureWick    Standing Status:   Standing    Number of Occurrences:   1   Initiate Oral Care Protocol    Standing Status:   Standing    Number of Occurrences:   1   Initiate Carrier Fluid Protocol    Standing Status:   Standing    Number of Occurrences:   1   RN may order General Admission PRN Orders utilizing "General Admission PRN medications" (through manage orders) for the following patient needs: allergy symptoms (Claritin), cold sores (Carmex), cough (Robitussin DM), eye irritation (Liquifilm Tears), hemorrhoids (Tucks), indigestion (Maalox), minor skin irritation (Hydrocortisone Cream), muscle pain (Ben Gay), nose irritation (saline nasal spray) and sore throat (Chloraseptic spray).    Standing Status:   Standing    Number of Occurrences:   [id number]   Cardiac Monitoring Continuous x 48 hours Indications for use: Other; Other indications for use: hypotension.    Standing Status:   Standing    Number of Occurrences:   1    Order Specific Question:   Indications for use:    Answer:   Other    Order Specific Question:   Other indications for use:    Answer:   hypotension.   Full code    Standing Status:   Standing    Number of Occurrences:   1    Order Specific Question:   By:    Answer:   Other   Consult to hospitalist    Standing Status:   Standing    Number of Occurrences:   1    Order Specific Question:   Place call to:    Answer:   [id number]    Order Specific Question:   Reason for Consult    Answer:   Admit   Consult to gastroenterology Consult Timeframe: ROUTINE - requires response within 24 hours; Reason for Consult? Diarrhea 2 weeks / crohns    Standing Status:   Standing    Number of  Occurrences:   1    Order Specific Question:   Consult Timeframe    Answer:   ROUTINE - requires response within 24 hours    Order Specific Question:   Reason for Consult?    Answer:   Diarrhea 2 weeks / crohns   Enteric precautions (UV disinfection) C difficile, Norovirus    Standing Status:   Standing    Number of Occurrences:   1   Pulse oximetry check with vital signs    Standing Status:   Standing    Number of Occurrences:   1   Oxygen therapy Mode or (Route): Nasal cannula; Liters  Per Minute: 2; Keep 02 saturation: greater than 92 %    Standing Status:   Standing    Number of Occurrences:   20    Order Specific Question:   Mode or (Route)    Answer:   Nasal cannula    Order Specific Question:   Liters Per Minute    Answer:   2    Order Specific Question:   Keep 02 saturation    Answer:   greater than 92 %   ED EKG    Standing Status:   Standing    Number of Occurrences:   1    Order Specific Question:   Reason for Exam    Answer:   Chest Pain   Admit to Inpatient (patient's expected length of stay will be greater than 2 midnights or inpatient only procedure)    Standing Status:   Standing    Number of Occurrences:   1    Order Specific Question:   Hospital Area    Answer:   [facility] [[id number]]    Order Specific Question:   Level of Care    Answer:   Telemetry Medical [104]    Order Specific Question:   Covid Evaluation    Answer:   Asymptomatic - no recent exposure (last 10 days) testing not required    Order Specific Question:   Diagnosis    Answer:   Dizziness [161096]    Order Specific Question:   Admitting Physician    Answer:   Darrold Junker    Order Specific Question:   Attending Physician    Answer:   Darrold Junker    Order Specific Question:   Certification:    Answer:   I certify this patient will need inpatient services for at least 2 midnights    Order Specific Question:   Estimated Length of Stay    Answer:   2     Meds ordered this encounter  Medications   sodium chloride 0.9 % bolus 1,000 mL   heparin injection 5,000 Units   sodium chloride flush (NS) 0.9 % injection 3 mL   OR Linked Order Group    acetaminophen (TYLENOL) tablet 650 mg    acetaminophen (TYLENOL) suppository 650 mg   morphine (PF) 2 MG/ML injection 2 mg   lactated ringers infusion    Admission Imaging : CT ABDOMEN PELVIS WO CONTRAST  Result Date: 09/10/2022 CLINICAL DATA:  Abdominal pain, acute, nonlocalized h/o crohn's, diarrhea, abd pain EXAM: CT ABDOMEN AND PELVIS WITHOUT CONTRAST TECHNIQUE: Multidetector CT imaging of the abdomen and pelvis was performed following the standard protocol without IV contrast. RADIATION DOSE REDUCTION: This exam was performed according to the departmental dose-optimization program which includes automated exposure control, adjustment of the mA and/or kV according to patient size and/or use of iterative reconstruction technique. COMPARISON:  10/10/2021 FINDINGS: Lower chest: No acute abnormality Hepatobiliary: Gallbladder contracted. No focal hepatic abnormality. Pancreas: No focal abnormality or ductal dilatation. Spleen: No focal abnormality.  Normal size. Adrenals/Urinary Tract: No adrenal abnormality. No focal renal abnormality. No stones or hydronephrosis. Urinary bladder is unremarkable. Stomach/Bowel: Prior colectomy. Residual Hartmann's pouch. Right lower quadrant ostomy. 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 No bowel obstruction. Vascular/Lymphatic: No evidence of aneurysm or adenopathy. Reproductive: No visible focal abnormality. Other: No free fluid or free air. Musculoskeletal: No acute bony abnormality. IMPRESSION: No acute findings. Prior colectomy. 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 No bowel obstruction. No change since prior study. Electronically Signed   By: Charlett Nose M.D.   On: 09/10/2022 23:20    DG Chest 2 View  Result Date: 09/10/2022 CLINICAL DATA:  Dizziness. EXAM: CHEST - 2 VIEW COMPARISON:  October 20, 2021 FINDINGS: The heart size and mediastinal contours are within normal limits. Both lungs are clear. The visualized skeletal structures are unremarkable. IMPRESSION: No active cardiopulmonary disease. Electronically Signed   By: Aram Candela M.D.   On: 09/10/2022 19:25   Physical Examination: Vitals:   09/10/22 1843 09/10/22 2315 09/10/22 2317  BP: 90/63 115/77   Pulse: 80 (!) 50   Temp: 97.9 F (36.6 C)  98 F (36.7 C)  Resp: 18 16   SpO2: 97% 98%   TempSrc:   Oral   Physical Exam Vitals and nursing note reviewed.  Constitutional:      General: He is not in acute distress.    Appearance: Normal appearance. He is not ill-appearing, toxic-appearing or diaphoretic.  HENT:     Head: Normocephalic and atraumatic.     Right Ear: Hearing and external ear normal.     Left Ear: Hearing and external ear normal.     Nose: Nose normal. No nasal deformity.     Mouth/Throat:     Lips: Pink.     Mouth: Mucous membranes are moist.     Tongue: No lesions.     Pharynx: Oropharynx is clear.  Eyes:     Extraocular Movements: Extraocular movements intact.  Cardiovascular:     Rate and Rhythm: Normal rate and regular rhythm.     Pulses: Normal pulses.     Heart sounds: Normal heart sounds.  Pulmonary:     Effort: Pulmonary effort is normal.     Breath sounds: Normal breath sounds.  Abdominal:     General: Bowel sounds are normal. There is no distension.     Palpations: Abdomen is soft. There is no mass.     Tenderness: There is no abdominal tenderness. There is no guarding.     Hernia: No hernia is present.  Musculoskeletal:     Right lower leg: No edema.     Left lower leg: No edema.  Skin:    General: Skin is warm.  Neurological:     General: No focal deficit present.     Mental Status: He is alert and oriented to person, place, and time.     Cranial Nerves:  Cranial nerves 2-12 are intact.     Motor: Motor function is intact.  Psychiatric:        Attention and Perception: Attention normal.        Mood and Affect: Mood normal.        Speech: Speech normal.        Behavior: Behavior normal. Behavior is cooperative.        Cognition and Memory: Cognition normal.     Assessment and Plan: * Dizziness Secondary to hypotension and hypovolemia from medications along with diarrhea. Aspiration/ fall precaution.   Chronic HFrEF (heart failure with reduced ejection fraction) (HCC) Strict I's and O's. Restart patient's GDMT tomorrow morning as deemed appropriate. Patient is euvolemic. No edema no JVD no shortness of breath no pleural effusion.   Essential hypertension Currently we will hold patient's Coreg, Lasix, Entresto, Aldactone. Medications to be increased titrated up once patient is no longer hypotensive.  We will check orthostatic vitals prior to reinitiation of medications. Cardiology  consult as deemed appropriate.  Crohn's disease (HCC) GI consult. Steroids if abd pain or any other symptoms.    Coronary artery disease of native artery of native heart with stable angina pectoris (HCC) Stable. No complaints of chest discomfort or anginal equivalent symptoms. Will resume medications once patient is stable.  AKI (acute kidney injury) [facility]) Lab Results  Component Value Date   CREATININE 2.28 (H) 09/10/2022   CREATININE 1.53 (H) 09/07/2022   CREATININE 1.37 (H) 07/15/2022  Currently hold patient's Entresto Aldactone Lasix and Coreg to correct patient's blood pressure and prerenal etiology causing AKI.    DVT prophylaxis:  SCDs Code Status:  Full code    09/10/2022   11:20 PM  Advanced Directives  Does Patient Have a Medical Advance Directive? No    Family Communication:  None Emergency Contact: Contact Information     Name Relation Home Work Mobile   Tanton,Yvonne Mother [phone]         Disposition Plan:   Home Consults: GI: Dr. Servando Snare. Admission status: Inpatient Unit / Expected LOS: Med/tele/2 days  Gertha Calkin MD [facility]  6 PM- 2 AM. [phone]( Pager )  For questions regarding this patient please use [email or web address] to contact the current [facility] MD.   Bonita Quin may also call [phone] to contact current Assigned [facility] Attending/Consulting MD for this patient.

## 2022-09-10 NOTE — Patient Instructions (Addendum)
Medication Instructions:  Decrease Entresto to 0.5 tablets twice daily. Decrease Carvedilol to 12.5 mg twice daily.  *If you need a refill on your cardiac medications before your next appointment, please call your pharmacy*   Lab Work: No labs ordered today.   Testing/Procedures: No testing ordered today.    Follow-Up: At [facility], you and your health needs are our priority.  As part of our continuing mission to provide you with exceptional heart care, we have created designated Provider Care Teams.  These Care Teams include your primary Cardiologist (physician) and Advanced Practice Providers (APPs -  Physician Assistants and Nurse Practitioners) who all work together to provide you with the care you need, when you need it.  We recommend signing up for the patient portal called "MyChart".  Sign up information is provided on this After Visit Summary.  MyChart is used to connect with patients for Virtual Visits (Telemedicine).  Patients are able to view lab/test results, encounter notes, upcoming appointments, etc.  Non-urgent messages can be sent to your provider as well.   To learn more about what you can do with MyChart, go to [email or web address].    Your next appointment:   2 month(s)  Provider:   You may see Yvonne Kendall, MD or one of the following Advanced Practice Providers on your designated Care Team:   Nicolasa Ducking, NP Eula Listen, PA-C Cadence Fransico Michael, PA-C Charlsie Quest, NP    Other Instructions We have placed a referral for Gastroenterology they will be contacting you to schedule an appointment. Heart Failure Clinic will be reaching out to you for an appointment.

## 2022-09-11 ENCOUNTER — Encounter: Payer: Medicaid Other | Admitting: Adult Health

## 2022-09-11 ENCOUNTER — Inpatient Hospital Stay (HOSPITAL_COMMUNITY)
Admit: 2022-09-11 | Discharge: 2022-09-11 | Disposition: A | Discharge status: Home / Self Care | Payer: Medicaid Other | Attending: Cardiovascular Disease | Admitting: Cardiovascular Disease

## 2022-09-11 ENCOUNTER — Other Ambulatory Visit: Payer: Self-pay

## 2022-09-11 DIAGNOSIS — I959 Hypotension, unspecified: Secondary | ICD-10-CM | POA: Diagnosis not present

## 2022-09-11 DIAGNOSIS — I5022 Chronic systolic (congestive) heart failure: Secondary | ICD-10-CM

## 2022-09-11 DIAGNOSIS — K5010 Crohn's disease of large intestine without complications: Secondary | ICD-10-CM | POA: Diagnosis not present

## 2022-09-11 DIAGNOSIS — R072 Precordial pain: Secondary | ICD-10-CM

## 2022-09-11 DIAGNOSIS — I2489 Other forms of acute ischemic heart disease: Secondary | ICD-10-CM | POA: Diagnosis not present

## 2022-09-11 DIAGNOSIS — R079 Chest pain, unspecified: Secondary | ICD-10-CM

## 2022-09-11 DIAGNOSIS — N179 Acute kidney failure, unspecified: Secondary | ICD-10-CM

## 2022-09-11 DIAGNOSIS — K591 Functional diarrhea: Secondary | ICD-10-CM

## 2022-09-11 LAB — PROTIME-INR
INR: 1.1 (ref 0.8–1.2)
Prothrombin Time: 14.1 seconds (ref 11.4–15.2)

## 2022-09-11 LAB — COMPREHENSIVE METABOLIC PANEL
ALT: 31 U/L (ref 0–44)
AST: 20 U/L (ref 15–41)
Albumin: 3.4 g/dL — ABNORMAL LOW (ref 3.5–5.0)
Alkaline Phosphatase: 65 U/L (ref 38–126)
Anion gap: 6 (ref 5–15)
BUN: 36 mg/dL — ABNORMAL HIGH (ref 6–20)
CO2: 20 mmol/L — ABNORMAL LOW (ref 22–32)
Calcium: 8.9 mg/dL (ref 8.9–10.3)
Chloride: 112 mmol/L — ABNORMAL HIGH (ref 98–111)
Creatinine, Ser: 1.96 mg/dL — ABNORMAL HIGH (ref 0.61–1.24)
GFR, Estimated: 40 mL/min — ABNORMAL LOW (ref >60)
Glucose, Bld: 120 mg/dL — ABNORMAL HIGH (ref 70–99)
Potassium: 4.5 mmol/L (ref 3.5–5.1)
Sodium: 138 mmol/L (ref 135–145)
Total Bilirubin: 0.7 mg/dL (ref 0.3–1.2)
Total Protein: 6.5 g/dL (ref 6.5–8.1)

## 2022-09-11 LAB — GASTROINTESTINAL PANEL BY PCR, STOOL (REPLACES STOOL CULTURE)

## 2022-09-11 LAB — ECHOCARDIOGRAM COMPLETE
AR max vel: 2.9 cm2
AV Area VTI: 3.28 cm2
AV Area mean vel: 2.67 cm2
AV Mean grad: 3 mmHg
AV Peak grad: 4.4 mmHg
Ao pk vel: 1.05 m/s
Area-P 1/2: 2.52 cm2
Calc EF: 55.5 %
Height: 70 in
MV VTI: 1.91 cm2
S' Lateral: 4.2 cm
Single Plane A2C EF: 53.2 %
Single Plane A4C EF: 54 %
Weight: 3668.45 oz

## 2022-09-11 LAB — C-REACTIVE PROTEIN
CRP: 0.8 mg/dL (ref <1.0)
CRP: 0.6 mg/dL (ref <1.0)

## 2022-09-11 LAB — CBC
HCT: 41.9 % (ref 39.0–52.0)
Hemoglobin: 14.1 g/dL (ref 13.0–17.0)
MCH: 27.9 pg (ref 26.0–34.0)
MCHC: 33.7 g/dL (ref 30.0–36.0)
MCV: 83 fL (ref 80.0–100.0)
Platelets: 205 10*3/uL (ref 150–400)
RBC: 5.05 MIL/uL (ref 4.22–5.81)
RDW: 14 % (ref 11.5–15.5)
WBC: 7.3 10*3/uL (ref 4.0–10.5)
nRBC: 0 % (ref 0.0–0.2)

## 2022-09-11 LAB — C DIFFICILE QUICK SCREEN W PCR REFLEX
C Diff antigen: NEGATIVE
C Diff interpretation: NOT DETECTED
C Diff toxin: NEGATIVE

## 2022-09-11 LAB — GLUCOSE, CAPILLARY: Glucose-Capillary: 94 mg/dL (ref 70–99)

## 2022-09-11 LAB — TROPONIN I (HIGH SENSITIVITY)
Troponin I (High Sensitivity): 40 ng/L — ABNORMAL HIGH (ref <18)
Troponin I (High Sensitivity): 49 ng/L — ABNORMAL HIGH (ref <18)

## 2022-09-11 LAB — HIV ANTIBODY (ROUTINE TESTING W REFLEX): HIV Screen 4th Generation wRfx: NONREACTIVE

## 2022-09-11 LAB — APTT: aPTT: 36 seconds (ref 24–36)

## 2022-09-11 MED ORDER — ATORVASTATIN CALCIUM 20 MG PO TABS
80.0000 mg | ORAL_TABLET | Freq: Every evening | ORAL | Status: DC
  Administered 2022-09-11 – 2022-09-12 (×2): 80 mg via ORAL
  Filled 2022-09-11 (×2): qty 4

## 2022-09-11 MED ORDER — CARVEDILOL 6.25 MG PO TABS
12.5000 mg | ORAL_TABLET | Freq: Two times a day (BID) | ORAL | Status: DC
  Administered 2022-09-11 – 2022-09-13 (×4): 12.5 mg via ORAL
  Filled 2022-09-11 (×4): qty 2

## 2022-09-11 MED ORDER — ASPIRIN 325 MG PO TABS
325.0000 mg | ORAL_TABLET | Freq: Once | ORAL | Status: AC
  Administered 2022-09-11: 325 mg via ORAL
  Filled 2022-09-11: qty 1

## 2022-09-11 MED ORDER — FENOFIBRATE 54 MG PO TABS
54.0000 mg | ORAL_TABLET | Freq: Every day | ORAL | Status: DC
  Administered 2022-09-11 – 2022-09-13 (×3): 54 mg via ORAL
  Filled 2022-09-11 (×3): qty 1

## 2022-09-11 MED ORDER — HEPARIN BOLUS VIA INFUSION
4000.0000 [IU] | Freq: Once | INTRAVENOUS | Status: DC
  Filled 2022-09-11: qty 4000

## 2022-09-11 MED ORDER — HEPARIN SODIUM (PORCINE) 5000 UNIT/ML IJ SOLN
5000.0000 [IU] | Freq: Two times a day (BID) | INTRAMUSCULAR | Status: DC
  Administered 2022-09-11 – 2022-09-13 (×4): 5000 [IU] via SUBCUTANEOUS
  Filled 2022-09-11 (×4): qty 1

## 2022-09-11 MED ORDER — ASPIRIN 81 MG PO TBEC
81.0000 mg | DELAYED_RELEASE_TABLET | Freq: Every day | ORAL | Status: DC
  Administered 2022-09-12 – 2022-09-13 (×2): 81 mg via ORAL
  Filled 2022-09-11 (×2): qty 1

## 2022-09-11 MED ORDER — EZETIMIBE 10 MG PO TABS
10.0000 mg | ORAL_TABLET | Freq: Every day | ORAL | Status: DC
  Administered 2022-09-11 – 2022-09-13 (×3): 10 mg via ORAL
  Filled 2022-09-11 (×3): qty 1

## 2022-09-11 MED ORDER — NITROGLYCERIN 0.4 MG SL SUBL: 0.4000 mg | SUBLINGUAL_TABLET | SUBLINGUAL | Status: DC | PRN

## 2022-09-11 MED ORDER — CLOPIDOGREL BISULFATE 75 MG PO TABS
75.0000 mg | ORAL_TABLET | Freq: Every day | ORAL | Status: DC
  Administered 2022-09-11 – 2022-09-13 (×3): 75 mg via ORAL
  Filled 2022-09-11 (×3): qty 1

## 2022-09-11 MED ORDER — HEPARIN (PORCINE) 25000 UT/250ML-% IV SOLN: 1300.0000 [IU]/h | INTRAVENOUS | Status: DC

## 2022-09-11 MED ORDER — NITROGLYCERIN 0.4 MG SL SUBL
0.4000 mg | SUBLINGUAL_TABLET | SUBLINGUAL | Status: DC | PRN
  Administered 2022-09-11 (×3): 0.4 mg via SUBLINGUAL
  Filled 2022-09-11: qty 1

## 2022-09-11 NOTE — Consult Note (Addendum)
Cardiology Consultation   Patient ID: Clarence [name] MRN: [id number]; DOB: Dec 04, 1970  Admit date: 09/10/2022 Date of Consult: 09/11/2022  PCP:  Clarence Bookman, NP   [facility] Providers Cardiologist:  Clarence Kendall, MD  Advanced Heart Failure:  Clarence Ancona, MD  {   Patient Profile:   Clarence [name] is a 52 y.o. male with a hx of CAD, chronic systolic CHF, Crohn's disease s/p colectomy, CKD stage 3, HTN who is being seen 09/11/2022 for the evaluation of chest pain at the request of Dr. Daleen [name].  History of Present Illness:   Clarence [name] is followed by Clarence [name] for the above cardiac issues.   The patient was admitted in 07/2021 found to have new cardiomyopathy, CHF and atypical PNA. Echo 07/2021 showed EF 30-35%, moderate LVH, normal RV. LHC showed occluded RCA with collaterals, 60% mLAD, 65% mLCx, 90% OM1. RHC showed elevated filling pressures and low CI. Patient was admitted again in 08/2021 with CHF. He was admitted in 09/2021 with hypertensive urgency and chest pain. CTA abd/pelvis did not show renal artery stenosis. He was admitted again in 10/2021 with hypertensive urgency. He quit smoking after 07/2021 admission. Rarely ETOH and no drugs. No strong family history of CAD or cardiomyopathy.  Sleep study was no suggestive of OSA.  Seen by Clarence [name] 09/10/22 and reported low blood pressures and persistent diarrhea. BP was 98/80 at the visit. His cardiac medications were decreased. He was previously in the ER and was given IVF.   The patient presented to the ER 09/10/22 with hypotension and dehydration with ongoing diarrhea. He had recent antibiotic use with penicillin for a possible dental infection. No chest pain was reported on initial presentation.  In the ER BP 90/63, pulse 80bpm, RR 18, afebrile. Labs showed potassium 5.3, Scr 2.28, BUN 36, glucose 123. EKG showed NSR, 81bpm, TWI V5, V6 and aVF. CXR negative. CTA abdomen pelvis showed no acute issue. The patient was  given IVF and admitted for further work-up.   On 4/26 the patient complained of chest pain and took 3NTG with resolution of pain. EKG showed no ischemic changes.troponins were ordered.   Past Medical History:  Diagnosis Date   Ascending Aortic Dilation    a. 09/2021 Echo: Asc Ao 40mm.   CAD (coronary artery disease)    a. 07/2021 Cath: LM nl, LAD 48m, D2 40, LCX 65p/m, OM1 90, RCA 13m w/ L-.R collats to RPDA from septal 1/2-->Med rx.   Chronic HFrEF (heart failure with reduced ejection fraction) (HCC)    a. 07/2021 Echo: EF 30-35%, glob HK, mod LVH, GrII DD, nl RV fxn, mildly dil LA, mild-mod MR; b. 09/2021 Echo: EF 35-40%, glob HK, mod LVH, GrI DD, nl RV fxn, mildly dil RA, mild MR, Asc Ao 40mm.   CKD (chronic kidney disease) stage 2, GFR 60-89 ml/min    Crohn's disease (HCC)    Hypertension    Mixed Ischemic & Nonischemic cardiomyopathy    a. 07/2021 Echo: EF 30-35%; b. 07/2021 Cath: Occluded RCA w/ mod LAD/LCX dzs, and severe OM1 dzs-->Med Rx; [name]. 09/2021 Echo: EF 35-40%.   PAH (pulmonary artery hypertension) (HCC)    a. 07/2021 RHC: PA 67/30 (42).   Proteinuria     Past Surgical History:  Procedure Laterality Date   COLECTOMY     RIGHT/LEFT HEART CATH AND CORONARY ANGIOGRAPHY N/A 08/07/2021   Procedure: RIGHT/LEFT HEART CATH AND CORONARY ANGIOGRAPHY;  Surgeon: Clarence Kendall, MD;  Location: [facility] INVASIVE CV  LAB;  Service: Cardiovascular;  Laterality: N/A;     Home Medications:  Prior to Admission medications   Medication Sig Start Date [name] Date Taking? Authorizing Provider  acetaminophen (TYLENOL) 325 MG tablet Take 650 mg by mouth at bedtime.   Yes [provider]  aspirin EC 81 MG tablet Take 1 tablet (81 mg total) by mouth daily. Swallow whole. 05/20/22  Yes [name], Clarence Malta, [name]  atorvastatin (LIPITOR) 80 MG tablet Take 1 tablet (80 mg total) by mouth every evening. 05/20/22  Yes [name], Clarence Malta, [name]  carvedilol (COREG) 12.5 MG tablet Take 1 tablet (12.5 mg total) by  mouth 2 (two) times daily with a meal. 09/10/22  Yes [name], Clarence Deer, MD  clopidogrel (PLAVIX) 75 MG tablet Take 1 tablet (75 mg total) by mouth daily. 05/20/22  Yes [name], Clarence Malta, [name]  empagliflozin (JARDIANCE) 10 MG TABS tablet Take 10 mg by mouth daily.   Yes [provider]  ezetimibe (ZETIA) 10 MG tablet Take 1 tablet (10 mg total) by mouth daily. 11/19/21  Yes [name], Clarence Mutton, PA-[name]  famotidine (PEPCID) 10 MG tablet Take 10 mg by mouth at bedtime. As needed   Yes [provider]  fenofibrate (TRICOR) 48 MG tablet Take 1 tablet (48 mg total) by mouth daily. 05/20/22  Yes [name], Clarence Malta, [name]  nitroGLYCERIN (NITROSTAT) 0.4 MG SL tablet Place 1 tablet (0.4 mg total) under the tongue every 5 (five) minutes as needed for chest pain. 07/29/22  Yes [name], Clarence C, NP  ondansetron (ZOFRAN) 4 MG tablet Take 4 mg by mouth every 8 (eight) hours as needed for nausea or vomiting.   Yes [provider]  sacubitril-valsartan (ENTRESTO) 97-103 MG Take 1/2 tablet by mouth 2 (two) times daily. 09/10/22  Yes [name], Clarence Deer, MD  spironolactone (ALDACTONE) 25 MG tablet Take 12.5 mg by mouth daily.   Yes [provider]  furosemide (LASIX) 20 MG tablet Take 20 mg by mouth daily as needed. For weight gain of 3 lb in 24 hours or 5 lbs in one week Patient not taking: Reported on 09/10/2022    [provider]    Inpatient Medications: Scheduled Meds:  heparin  4,000 Units Intravenous Once   sodium chloride flush  3 mL Intravenous Q12H   Continuous Infusions:  heparin     lactated ringers 75 mL/hr at 09/11/22 0042   PRN Meds: acetaminophen **OR** acetaminophen, morphine injection, nitroGLYCERIN  Allergies:    Allergies  Allergen Reactions   Ibuprofen Hives and Other (See Comments)    Other reaction(s): GI Upset (intolerance) Hives Tolerates Advil  Other reaction(s): Not available   Spironolactone     Hyperkalemia   Sulfacetamide Other (See Comments)    Neomycin-Bacitracin Zn-Polymyx     Other reaction(s): Other (See Comments)    Social History:   Social History   Socioeconomic History   Marital status: Single    Spouse name: Not on file   Number of children: Not on file   Years of education: Not on file   Highest education level: Not on file  Occupational History   Not on file  Tobacco Use   Smoking status: Former    Types: Cigars    Quit date: 08/13/2021    Years since quitting: 1.0   Smokeless tobacco: Not on file  Vaping Use   Vaping Use: Never used  Substance and Sexual Activity   Alcohol use: Not Currently    Alcohol/week: 1.0 standard drink of alcohol  Types: 1 Standard drinks or equivalent per week   Drug use: Never   Sexual activity: Not on file  Other Topics Concern   Not on file  Social History Narrative   Patient is a past smoker for 10 years, drinks decaf coffee and tea.   Patient is single and lives at home with his mother and uncle and has no pets.   Patient does not exercise daily due to difficulty walking distances.   Patient has no issues with his functional status and is independent in caring for himself   Social Determinants of Health   Financial Resource Strain: High Risk (01/29/2022)   Overall Financial Resource Strain (CARDIA)    Difficulty of Paying Living Expenses: Hard  Food Insecurity: No Food Insecurity (01/27/2022)   Hunger Vital Sign    Worried About Running Out of Food in the Last Year: Never true    Ran Out of Food in the Last Year: Never true  Transportation Needs: No Transportation Needs (01/27/2022)   PRAPARE - Administrator, Civil Service (Medical): No    Lack of Transportation (Non-Medical): No  Physical Activity: Not on file  Stress: Not on file  Social Connections: Not on file  Intimate Partner Violence: Not on file    Family History:    Family History  Problem Relation Age of Onset   Hypertension Mother    Cancer Maternal Aunt    Heart disease Maternal  Uncle    Pancreatic cancer Maternal Uncle      ROS:  Please see the history of present illness.   All other ROS reviewed and negative.     Physical Exam/Data:   Vitals:   09/11/22 0115 09/11/22 0116 09/11/22 0357 09/11/22 0705  BP:   (!) 104/58 (!) 166/110  Pulse:   (!) 54 67  Resp:   16 18  Temp:   98 F (36.7 [name]) 98 F (36.7 [name])  TempSrc:   Oral   SpO2:   99% 100%  Weight: 104 kg     Height:  5\' 10"  (1.778 m)     No intake or output data in the 24 hours ending 09/11/22 0800    09/11/2022    1:15 AM 09/10/2022    2:12 PM 08/11/2022   11:06 AM  Last 3 Weights  Weight (lbs) 229 lb 4.5 oz 227 lb 2 oz 229 lb 3.2 oz  Weight (kg) 104 kg 103.023 kg 103.964 kg     Body mass index is 32.9 kg/m.  General:  Well nourished, well developed, in no acute distress HEENT: normal Neck: no JVD Vascular: No carotid bruits; Distal pulses 2+ bilaterally Cardiac:  normal S1, S2; RRR; no murmur  Lungs:  clear to auscultation bilaterally, no wheezing, rhonchi or rales  Abd: soft, nontender, no hepatomegaly  Ext: no edema Musculoskeletal:  No deformities, BUE and BLE strength normal and equal Skin: warm and dry  Neuro:  CNs 2-12 intact, no focal abnormalities noted Psych:  Normal affect   EKG:  The EKG was personally reviewed and demonstrates:  NSR 81bpm, TWI V5, V6, III and aVF Telemetry:  Telemetry was personally reviewed and demonstrates:  SB/SR Hr 60s, PVCs  Relevant CV Studies:  Echo 09/2021 1. Left ventricular ejection fraction, by estimation, is 35 to 40%. The  left ventricle has moderately decreased function. The left ventricle  demonstrates global hypokinesis. There is moderate left ventricular  hypertrophy. Left ventricular diastolic  parameters are consistent with Grade I diastolic dysfunction (  impaired  relaxation).   2. Right ventricular systolic function is normal. The right ventricular  size is normal.   3. Right atrial size was mildly dilated.   4. The mitral valve is  normal in structure. Mild mitral valve  regurgitation. No evidence of mitral stenosis.   5. The aortic valve is tricuspid. Aortic valve regurgitation is not  visualized. No aortic stenosis is present.   6. There is mild dilatation of the ascending aorta, measuring 40 mm.   Cardiac cath 07/2021 Conclusions: Diffuse moderate-severe, multivessel coronary artery disease, including chronic total occlusion of mid RCA, diffuse plaquing of LAD with up to 60% stenosis in the mid vessel, 60-70% proximal/mid LCx stenosis, and 90% lesion of OM1. Moderately elevated left heart filling pressures (LVEDP/PCWP 25 mmHg). Severely elevated right heart filling pressures (mean RA 16 mmHg, RVEDP 30 mmHg). Moderate-severe pulmonary hypertension (mean PAP 42 mmHg, PVR 4.0 WU). Moderately reduced Fick cardiac output/index (CO 4.2 L/min, CI 2.0 L/min/m^2).Marland Kitchen   Recommendations: Suspect cardiomyopathy is a combination of ischemic and nonischemic etiologies confounded by significant pulmonary hypertension and right heart failure that may be due to underlying lung disease.  Recommend continued diuresis and optimization of GDMT for acute HFrEF.  In the setting of ongoing wheezing with concern for obstructive lung disease, I will hold carvedilol for now and add Entresto. Secondary prevention of coronary artery disease, including high-intensity statin therapy.  Diffuse nature of CAD is not ideal for percutaneous or surgical revascularization.  Mild troponin elevation on admission is most likely due to demand ischemia in the setting of acute HFrEF and chronic ischemic heart disease.   Clarence Kendall, MD [facility]     Recommendations  Antiplatelet/Anticoag Continue indefinite aspirin 81 mg daily.  Defer addition of P2Y12 inhibitor as coronary artery disease is likely chronic with mild troponin elevation reflecting demand ischemia.    Echo 07/2021  1. Left ventricular ejection fraction, by estimation, is 30 to 35%. The   left ventricle has moderately decreased function. The left ventricle  demonstrates global hypokinesis. There is moderate left ventricular  hypertrophy. Left ventricular diastolic  parameters are consistent with Grade II diastolic dysfunction  (pseudonormalization).   2. Right ventricular systolic function is normal. The right ventricular  size is normal. Tricuspid regurgitation signal is inadequate for assessing  PA pressure.   3. Left atrial size was mildly dilated.   4. The mitral valve is abnormal. Mild to moderate mitral valve  regurgitation. No evidence of mitral stenosis.   5. The aortic valve has an indeterminant number of cusps. Aortic valve  regurgitation is not visualized. No aortic stenosis is present.   6. The inferior vena cava is dilated in size with <50% respiratory  variability, suggesting right atrial pressure of 15 mmHg.   Laboratory Data:  High Sensitivity Troponin:  No results for input(s): "TROPONINIHS" in the last 720 hours.   Chemistry Recent Labs  Lab 09/07/22 1725 09/10/22 1846 09/11/22 0541  NA 139 137 138  K 4.6 5.3* 4.5  CL 107 109 112*  CO2 24 23 20*  GLUCOSE 96 123* 120*  BUN 26* 36* 36*  CREATININE 1.53* 2.28* 1.96*  CALCIUM 9.7 9.8 8.9  MG  --  2.2  --   GFRNONAA 54* 33* 40*  ANIONGAP 8 5 6     Recent Labs  Lab 09/07/22 1725 09/11/22 0541  PROT 7.6 6.5  ALBUMIN 4.0 3.4*  AST 26 20  ALT 31 31  ALKPHOS 92 65  BILITOT 0.8 0.7  Lipids No results for input(s): "CHOL", "TRIG", "HDL", "LABVLDL", "LDLCALC", "CHOLHDL" in the last 168 hours.  Hematology Recent Labs  Lab 09/07/22 1725 09/10/22 1846 09/11/22 0541  WBC 7.5 9.1 7.3  RBC 5.68 5.58 5.05  HGB 15.8 15.5 14.1  HCT 47.6 47.0 41.9  MCV 83.8 84.2 83.0  MCH 27.8 27.8 27.9  MCHC 33.2 33.0 33.7  RDW 13.5 13.7 14.0  PLT 242 273 205   Thyroid No results for input(s): "TSH", "FREET4" in the last 168 hours.  BNPNo results for input(s): "BNP", "PROBNP" in the last 168 hours.   DDimer No results for input(s): "DDIMER" in the last 168 hours.   Radiology/Studies:  CT ABDOMEN PELVIS WO CONTRAST  Result Date: 09/10/2022 CLINICAL DATA:  Abdominal pain, acute, nonlocalized h/o crohn's, diarrhea, abd pain EXAM: CT ABDOMEN AND PELVIS WITHOUT CONTRAST TECHNIQUE: Multidetector CT imaging of the abdomen and pelvis was performed following the standard protocol without IV contrast. RADIATION DOSE REDUCTION: This exam was performed according to the departmental dose-optimization program which includes automated exposure control, adjustment of the mA and/or kV according to patient size and/or use of iterative reconstruction technique. COMPARISON:  10/10/2021 FINDINGS: Lower chest: No acute abnormality Hepatobiliary: Gallbladder contracted. No focal hepatic abnormality. Pancreas: No focal abnormality or ductal dilatation. Spleen: No focal abnormality.  Normal size. Adrenals/Urinary Tract: No adrenal abnormality. No focal renal abnormality. No stones or hydronephrosis. Urinary bladder is unremarkable. Stomach/Bowel: Prior colectomy. Residual Hartmann's pouch. Right lower quadrant ostomy. 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 No bowel obstruction. Vascular/Lymphatic: No evidence of aneurysm or adenopathy. Reproductive: No visible focal abnormality. Other: No free fluid or free air. Musculoskeletal: No acute bony abnormality. IMPRESSION: No acute findings. Prior colectomy. 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 No bowel obstruction. No change since prior study. Electronically Signed   By: Charlett Nose M.D.   On: 09/10/2022 23:20   DG Chest 2 View  Result Date: 09/10/2022 CLINICAL DATA:  Dizziness. EXAM: CHEST - 2 VIEW COMPARISON:  October 20, 2021 FINDINGS: The heart size and mediastinal contours are within normal limits. Both lungs are clear. The visualized skeletal structures are unremarkable. IMPRESSION: No  active cardiopulmonary disease. Electronically Signed   By: Aram Candela M.D.   On: 09/10/2022 19:25     Assessment and Plan:   Chest pain CAD - admitted with hypotension and AKI - on 4/26 he reported chest pain resolved with NTG. EKG was unremarkable - initial HS trop 40 - cath in 07/2021 showed diffuse moderate-severe multivessel CAD, including CTO mRCA, diffuse plaquing of the LAD with up to 60% stenosis in the mid vessel, 60-70% proximal/mid Lcx stenosis, and 90% lesion of OM1. Diffuse CAD not ideal for PCI or surgical intervention. - continue to trend troponin - may need IV heparin pending troponin - echo ordered. He has known reduced LVEF - continue Aspirin, Plavix, Lipitor, Zetia, fenofibrate, SL NTG and coreg  AKI - improving with fluids - Scr baseline around 1.3  Chronic HFrEF ICM/NCIM - most recent echo showed LVEF 35-40%, G1DD, mild MR, mild aortic dilation measuring 40mm.  - PTA Entresto, Jardiance, spiro, and lasix held for AKI - continue Coreg - restart PTA medications as able  HTN - BP elevated - continue coreg 12.5mg  BID, restart other PTA meds as kidney function improves  Crohn's disease s/p colostomy Diarrhea - GI consulted   For questions or updates, please contact [facility] Please consult [email or web address] for contact  info under    Signed, Joey Lierman David Stall, PA-[name]  09/11/2022 8:00 AM

## 2022-09-11 NOTE — Progress Notes (Signed)
*  PRELIMINARY RESULTS* Echocardiogram 2D Echocardiogram has been performed.  Clarence [name] 09/11/2022, 12:52 PM

## 2022-09-11 NOTE — Progress Notes (Addendum)
   09/11/22 0705  Assess: MEWS Score  Temp 98 F (36.7 C)  BP (!) 166/110  MAP (mmHg) 127  Pulse Rate 67  Resp 18  Level of Consciousness Alert  SpO2 100 %  Assess: MEWS Score  MEWS Temp 0  MEWS Systolic 0  MEWS Pulse 0  MEWS RR 0  MEWS LOC 0  MEWS Score 0  MEWS Score Color Green  Provider Notification  Provider Name/Title Lurene Shadow MD  Date Provider Notified 09/11/22  Time Provider Notified 0700  Method of Notification Page  Notification Reason Change in status (Chest pain radiating to left shoulder)  Provider response See new orders;En route  Date of Provider Response 09/11/22  Time of Provider Response 0703  Notify: Rapid Response  Date Rapid Response Notified 09/11/22  Time Rapid Response Notified 0708  Assess: SIRS CRITERIA  SIRS Temperature  0  SIRS Pulse 0  SIRS Respirations  0  SIRS WBC 0  SIRS Score Sum  0   Pt called out reporting chest pain radiating to left shoulder rated 5/10, Provider notified, verbal orders given and commenced, rapid response notified, provider and RRT en route.

## 2022-09-11 NOTE — Progress Notes (Signed)
This encounter was created in error - please disregard.

## 2022-09-11 NOTE — Progress Notes (Signed)
Rapid Response Event Note   Reason for Call : Chest Pain   Initial Focused Assessment:   EKG done at bedisde Nitro given at 0710, 0721, 0731 Aspirin given at 0725 BP 166/100 HR 58 O2 100 on 2L [place]   Interventions:  Ayiku at bedside, see new orders  Plan of Care:  STAT Troponin, Echocardiogram, Transfer to PCU Begin IV Heparin  Event Summary:  Patient pain decreased from 7/10 to 3/10  MD Notified: Myriam Forehand Call Time: 0715 Arrival Time: 8657 End Time: 0745  Leanord Hawking, RN

## 2022-09-11 NOTE — Consult Note (Signed)
Wyline Mood , MD [address], [place], [place], [address] [address], [place], [place], [address] Phone: [phone]  Fax: [phone]  Consultation  Referring Provider:   Dr Allena Katz  Primary Care Physician:  Caesar Bookman, NP Primary Gastroenterologist:  None          Reason for Consultation:     Diarrhea  Date of Admission:  09/10/2022 Date of Consultation:  09/11/2022         HPI:   Clarence [name] is a 52 y.o. male admitted with dizinnes and shortnss of breath.   H/o colostomy S/p colectomy in 2009 at [facility], crohns disease mainly affecting his colon , doesn't recall any small bowel involvement , has a footof his colon left in case future re anastamosis is needed, not followed up for over 14 years with GI  , loose stools of 5-7 days duration , no blood or abdominal pain , resolved since coming into the hospital . On admission was in AKI felt likely due to dehydration. Denies any use of artificial sweetners, sugars, chewing  gum , sodas, or laxatives.    On admission Hb 15.8 grams, cr 2.28,GI PCR ,C diff -negative.    CT abdomen showed prior colectomy , parastomal hernia , no acute findings. She is on Plavix.   Recent antibiotics use for dental infection .   Past Medical History:  Diagnosis Date   Ascending Aortic Dilation    a. 09/2021 Echo: Asc Ao 40mm.   CAD (coronary artery disease)    a. 07/2021 Cath: LM nl, LAD 65m, D2 40, LCX 65p/m, OM1 90, RCA 153m w/ L-.R collats to RPDA from septal 1/2-->Med rx.   Chronic HFrEF (heart failure with reduced ejection fraction) (HCC)    a. 07/2021 Echo: EF 30-35%, glob HK, mod LVH, GrII DD, nl RV fxn, mildly dil LA, mild-mod MR; b. 09/2021 Echo: EF 35-40%, glob HK, mod LVH, GrI DD, nl RV fxn, mildly dil RA, mild MR, Asc Ao 40mm.   CKD (chronic kidney disease) stage 2, GFR 60-89 ml/min    Crohn's disease (HCC)    Hypertension    Mixed Ischemic & Nonischemic cardiomyopathy    a. 07/2021 Echo: EF 30-35%; b. 07/2021  Cath: Occluded RCA w/ mod LAD/LCX dzs, and severe OM1 dzs-->Med Rx; c. 09/2021 Echo: EF 35-40%.   PAH (pulmonary artery hypertension) (HCC)    a. 07/2021 RHC: PA 67/30 (42).   Proteinuria     Past Surgical History:  Procedure Laterality Date   COLECTOMY     RIGHT/LEFT HEART CATH AND CORONARY ANGIOGRAPHY N/A 08/07/2021   Procedure: RIGHT/LEFT HEART CATH AND CORONARY ANGIOGRAPHY;  Surgeon: Yvonne Kendall, MD;  Location: [facility] INVASIVE CV LAB;  Service: Cardiovascular;  Laterality: N/A;    Prior to Admission medications   Medication Sig Start Date End Date Taking? Authorizing Provider  acetaminophen (TYLENOL) 325 MG tablet Take 650 mg by mouth at bedtime.   Yes [provider]  aspirin EC 81 MG tablet Take 1 tablet (81 mg total) by mouth daily. Swallow whole. 05/20/22  Yes Milford, Anderson Malta, [name]  atorvastatin (LIPITOR) 80 MG tablet Take 1 tablet (80 mg total) by mouth every evening. 05/20/22  Yes Milford, Anderson Malta, [name]  carvedilol (COREG) 12.5 MG tablet Take 1 tablet (12.5 mg total) by mouth 2 (two) times daily with a meal. 09/10/22  Yes End, Cristal Deer, MD  clopidogrel (PLAVIX) 75 MG tablet Take 1 tablet (75 mg total) by  mouth daily. 05/20/22  Yes Milford, Anderson Malta, [name]  empagliflozin (JARDIANCE) 10 MG TABS tablet Take 10 mg by mouth daily.   Yes [provider]  ezetimibe (ZETIA) 10 MG tablet Take 1 tablet (10 mg total) by mouth daily. 11/19/21  Yes Dunn, Raymon Mutton, PA-C  famotidine (PEPCID) 10 MG tablet Take 10 mg by mouth at bedtime. As needed   Yes [provider]  fenofibrate (TRICOR) 48 MG tablet Take 1 tablet (48 mg total) by mouth daily. 05/20/22  Yes Milford, Anderson Malta, [name]  nitroGLYCERIN (NITROSTAT) 0.4 MG SL tablet Place 1 tablet (0.4 mg total) under the tongue every 5 (five) minutes as needed for chest pain. 07/29/22  Yes Ngetich, Dinah C, NP  ondansetron (ZOFRAN) 4 MG tablet Take 4 mg by mouth every 8 (eight) hours as needed for nausea or vomiting.   Yes [provider]  sacubitril-valsartan (ENTRESTO) 97-103 MG Take 1/2 tablet by mouth 2 (two) times daily. 09/10/22  Yes End, Cristal Deer, MD  spironolactone (ALDACTONE) 25 MG tablet Take 12.5 mg by mouth daily.   Yes [provider]  furosemide (LASIX) 20 MG tablet Take 20 mg by mouth daily as needed. For weight gain of 3 lb in 24 hours or 5 lbs in one week Patient not taking: Reported on 09/10/2022    [provider]    Family History  Problem Relation Age of Onset   Hypertension Mother    Cancer Maternal Aunt    Heart disease Maternal Uncle    Pancreatic cancer Maternal Uncle      Social History   Tobacco Use   Smoking status: Former    Types: Cigars    Quit date: 08/13/2021    Years since quitting: 1.0  Vaping Use   Vaping Use: Never used  Substance Use Topics   Alcohol use: Not Currently    Alcohol/week: 1.0 standard drink of alcohol    Types: 1 Standard drinks or equivalent per week   Drug use: Never    Allergies as of 09/10/2022 - Review Complete 09/10/2022  Allergen Reaction Noted   Ibuprofen Hives and Other (See Comments) 09/08/2012   Spironolactone  02/17/2022   Sulfacetamide Other (See Comments) 09/08/2012   Neomycin-bacitracin zn-polymyx  09/08/2012    Review of Systems:    All systems reviewed and negative except where noted in HPI.   Physical Exam:  Vital signs in last 24 hours: Temp:  [97.9 F (36.6 C)-98.4 F (36.9 C)] 98 F (36.7 C) (04/26 0705) Pulse Rate:  [48-80] 67 (04/26 0705) Resp:  [16-20] 18 (04/26 0705) BP: (90-166)/(58-110) 166/110 (04/26 0705) SpO2:  [97 %-100 %] 100 % (04/26 0705) Weight:  [103 kg-104 kg] 104 kg (04/26 0115)   General:   Pleasant, cooperative in NAD Head:  Normocephalic and atraumatic. Eyes:   No icterus.   Conjunctiva pink. PERRLA. Ears:  Normal auditory acuity. Neck:  Supple; no masses or thyroidomegaly Abdomen: RLQ colostomy bag with some green stool  Soft, nondistended, nontender. Normal bowel  sounds. No appreciable masses or hepatomegaly.  No rebound or guarding.  Neurologic:  Alert and oriented x3;  grossly normal neurologically. Skin:  Intact without significant lesions or rashes. Cervical Nodes:  No significant cervical adenopathy. Psych:  Alert and cooperative. Normal affect.  LAB RESULTS: Recent Labs    09/10/22 1846 09/11/22 0541  WBC 9.1 7.3  HGB 15.5 14.1  HCT 47.0 41.9  PLT 273 205   BMET Recent Labs    09/10/22  1846 09/11/22 0541  NA 137 138  K 5.3* 4.5  CL 109 112*  CO2 23 20*  GLUCOSE 123* 120*  BUN 36* 36*  CREATININE 2.28* 1.96*  CALCIUM 9.8 8.9   LFT Recent Labs    09/11/22 0541  PROT 6.5  ALBUMIN 3.4*  AST 20  ALT 31  ALKPHOS 65  BILITOT 0.7   PT/INR No results for input(s): "LABPROT", "INR" in the last 72 hours.  STUDIES: CT ABDOMEN PELVIS WO CONTRAST  Result Date: 09/10/2022 CLINICAL DATA:  Abdominal pain, acute, nonlocalized h/o crohn's, diarrhea, abd pain EXAM: CT ABDOMEN AND PELVIS WITHOUT CONTRAST TECHNIQUE: Multidetector CT imaging of the abdomen and pelvis was performed following the standard protocol without IV contrast. RADIATION DOSE REDUCTION: This exam was performed according to the departmental dose-optimization program which includes automated exposure control, adjustment of the mA and/or kV according to patient size and/or use of iterative reconstruction technique. COMPARISON:  10/10/2021 FINDINGS: Lower chest: No acute abnormality Hepatobiliary: Gallbladder contracted. No focal hepatic abnormality. Pancreas: No focal abnormality or ductal dilatation. Spleen: No focal abnormality.  Normal size. Adrenals/Urinary Tract: No adrenal abnormality. No focal renal abnormality. No stones or hydronephrosis. Urinary bladder is unremarkable. Stomach/Bowel: Prior colectomy. Residual Hartmann's pouch. Right lower quadrant ostomy. 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 No bowel obstruction.  Vascular/Lymphatic: No evidence of aneurysm or adenopathy. Reproductive: No visible focal abnormality. Other: No free fluid or free air. Musculoskeletal: No acute bony abnormality. IMPRESSION: No acute findings. Prior colectomy. 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 No bowel obstruction. No change since prior study. Electronically Signed   By: Charlett Nose M.D.   On: 09/10/2022 23:20   DG Chest 2 View  Result Date: 09/10/2022 CLINICAL DATA:  Dizziness. EXAM: CHEST - 2 VIEW COMPARISON:  October 20, 2021 FINDINGS: The heart size and mediastinal contours are within normal limits. Both lungs are clear. The visualized skeletal structures are unremarkable. IMPRESSION: No active cardiopulmonary disease. Electronically Signed   By: Aram Candela M.D.   On: 09/10/2022 19:25      Impression / Plan:   Clarence [name] is a 52 y.o. y/o male with a past history of crohns disease likely of large bowel and subsequently underwent colectomy with colostomy . Admitted with an episode of acute diarrhea, AKI, chest pain. CT abdomen shows no acute findings. Stool tests are negative for infection . Likely antibiotics induced diarrhea/ vial diarrhea which per patient has resolved   Plan  Follow up  CRP, Fecal calprotectin - if diarrhea recurs and inflammatory markers elevated then would need ileoscopy once cardiac issues have been resolved  Avoid any artificial sugars in his diet such as popsicles, sodas , jello, lactose containing products. Trial of Questran for diarrhea can be used if diarrhea recurs.   I will sign off.  Please call me if any further GI concerns or questions.  We would like to thank you for the opportunity to participate in the care of Clarence [name].   Thank you for involving me in the care of this patient.      LOS: 1 day   Wyline Mood, MD  09/11/2022, 8:22 AM    .

## 2022-09-11 NOTE — Progress Notes (Signed)
Chap visited the Pt following a Rapid Response page. Pt and the Dr in the room. This Chap introduced Spiritual and emotion care to the patient. Pt abit concerned at my appealing but after some talk he was encouraged. Pt said "I am only fighting this for my mum." Chap provided active listening.    09/11/22 0700  Spiritual Encounters  Type of Visit Initial  Care provided to: Patient  Conversation partners present during encounter Nurse  Referral source Code page  Reason for visit Urgent spiritual support  OnCall Visit Yes  Spiritual Framework  Presenting Themes Impactful experiences and emotions  Community/Connection Family  Interventions  Spiritual Care Interventions Made Mindfulness intervention;Established relationship of care and support  Intervention Outcomes  Outcomes Connection to spiritual care;Awareness of support  Spiritual Care Plan  Spiritual Care Issues Still Outstanding No further spiritual care needs at this time (see row info)

## 2022-09-11 NOTE — Progress Notes (Signed)
Provider and RRT present. Interventions ongoing, see MAR, report given to AM RN. Care ongoing.

## 2022-09-11 NOTE — Progress Notes (Addendum)
Progress Note    RUFFUS KAMAKA  WUJ:[id number] DOB: [birth year]  DOA: 09/10/2022 PCP: Caesar Bookman, NP      Brief Narrative:    Medical records reviewed and are as summarized below:  Clarence [name] is a 52 y.o. male with medical history significant for Crohn's disease, s/p colectomy and colostomy 2009 at [facility], CAD, hypertension, chronic systolic CHF (EF 35 to 40%), CKD stage IIIa, who presented to the hospital with dizziness, nausea, shortness of breath, fatigue and 1 week history of diarrhea.  He had gone for his cardiac rehab session on 09/09/2022 where he was found to be hypotensive with blood pressure in the upper 80s.  Rehab session was canceled.  He had also been seen in the emergency department on 09/07/2022 because of hypotension.  He was advised to hold his Lasix (for 3 days) at that time.  On the day of admission, he had gone to see his cardiologist who recommended decreasing antihypertensives.  He presented to the emergency department because of worsening symptoms.  On presentation, he was hypotensive with systolic BP in the 90s.  He was found to have acute kidney injury due to dehydration from diarrhea..    Assessment/Plan:   Principal Problem:   Dizziness Active Problems:   Essential hypertension   Chronic HFrEF (heart failure with reduced ejection fraction) (HCC)   Crohn's disease (HCC)   AKI (acute kidney injury) (HCC)   Coronary artery disease of native artery of native heart with stable angina pectoris (HCC)   Chest pain   Hypotension    Body mass index is 32.9 kg/m.  (Obesity)   AKI on CKD stage IIIa, mild hyperkalemia: Improving.  Hyperkalemia has resolved.  Continue IV fluids for hydration.   Dizziness from hypotension: BP is better.  Resume carvedilol.  Hold Lasix, Entresto and Aldactone.   Chest pain with history of CAD: He was treated with aspirin, 3 doses of nitroglycerin.  Troponins were mildly elevated but flat (40, 49).  EKG  showed sinus bradycardia with T wave inversions in leads I and aVL.  2D echo has been ordered.  Chest pain responded well to 3 doses of nitroglycerin.  Chest pain came down from a 7/10 to  1/10. Continue aspirin, Plavix and Lipitor. No IV heparin for now per cardiologist.  Case discussed with Dr. Mariah Milling, cardiologist.  Right and left heart cath report on 08/07/2021 as follows:  Conclusions: Diffuse moderate-severe, multivessel coronary artery disease, including chronic total occlusion of mid RCA, diffuse plaquing of LAD with up to 60% stenosis in the mid vessel, 60-70% proximal/mid LCx stenosis, and 90% lesion of OM1. Moderately elevated left heart filling pressures (LVEDP/PCWP 25 mmHg). Severely elevated right heart filling pressures (mean RA 16 mmHg, RVEDP 30 mmHg). Moderate-severe pulmonary hypertension (mean PAP 42 mmHg, PVR 4.0 WU). Moderately reduced Fick cardiac output/index (CO 4.2 L/min, CI 2.0 L/min/m^2).Marland Kitchen   Recommendations: Suspect cardiomyopathy is a combination of ischemic and nonischemic etiologies confounded by significant pulmonary hypertension and right heart failure that may be due to underlying lung disease.  Recommend continued diuresis and optimization of GDMT for acute HFrEF.  In the setting of ongoing wheezing with concern for obstructive lung disease, I will hold carvedilol for now and add Entresto. Secondary prevention of coronary artery disease, including high-intensity statin therapy.  Diffuse nature of CAD is not ideal for percutaneous or surgical revascularization.  Mild troponin elevation on admission is most likely due to demand ischemia in the setting of  acute HFrEF and chronic ischemic heart disease.    Chronic systolic CHF: Continue carvedilol.  Lasix, Entresto and Aldactone have been held.  2D echo in May 2023 showed EF of 35 to 40%, grade 1 diastolic dysfunction   Acute diarrheal illness, history of Crohn's disease s/p colostomy: Stool for C. difficile toxin  and GI panel were negative.  CRP and fecal calprotectin are pending.  He has been evaluated by the gastroenterologist.  Diet has been ordered.     Diet Order             Diet Heart Room service appropriate? Yes; Fluid consistency: Thin  Diet effective now                            Consultants: Cardiology Gastroenterologist  Procedures: None    Medications:    [START ON 09/12/2022] aspirin EC  81 mg Oral Daily   atorvastatin  80 mg Oral QPM   carvedilol  12.5 mg Oral BID WC   clopidogrel  75 mg Oral Daily   ezetimibe  10 mg Oral Daily   fenofibrate  54 mg Oral Daily   heparin injection (subcutaneous)  5,000 Units Subcutaneous Q12H   sodium chloride flush  3 mL Intravenous Q12H   Continuous Infusions:  lactated ringers 75 mL/hr at 09/11/22 0042     Anti-infectives (From admission, onward)    None              Family Communication/Anticipated D/C date and plan/Code Status   DVT prophylaxis: heparin injection 5,000 Units Start: 09/11/22 2200     Code Status: Full Code  Family Communication: None Disposition Plan: Plan to discharge home in 2 to 3 days   Status is: Inpatient Remains inpatient appropriate because: AKI, hypotension and chest pain       Subjective:   Interval events noted.  He complained of chest pain this morning.  Rapid response was called because of chest pain.  Chest pain was in the midsternal region and radiated to the left shoulder.  He was anxious about the fact that he had not taken his nitroglycerin and other medicines as he does at home.  No shortness of breath, palpitations, leg pain or dizziness.  Diarrhea is improving.   Objective:    Vitals:   09/11/22 0116 09/11/22 0357 09/11/22 0705 09/11/22 1116  BP:  (!) 104/58 (!) 166/110 128/83  Pulse:  (!) 54 67 (!) 50  Resp:  16 18   Temp:  98 F (36.7 C) 98 F (36.7 C) 97.8 F (36.6 C)  TempSrc:  Oral  Oral  SpO2:  99% 100% 99%  Weight:      Height:  5\' 10"  (1.778 m)      No data found.  No intake or output data in the 24 hours ending 09/11/22 1333 Filed Weights   09/11/22 0115  Weight: 104 kg    Exam:  GEN: NAD SKIN: No rash EYES: EOMI ENT: MMM CV: RRR PULM: CTA B ABD: soft, ND, NT, +BS, + colostomy right lower quadrant CNS: AAO x 3, non focal EXT: No edema or tenderness        Data Reviewed:   I have personally reviewed following labs and imaging studies:  Labs: Labs show the following:   Basic Metabolic Panel: Recent Labs  Lab 09/07/22 1725 09/10/22 1846 09/11/22 0541  NA 139 137 138  K 4.6 5.3* 4.5  CL 107 109 112*  CO2 24 23 20*  GLUCOSE 96 123* 120*  BUN 26* 36* 36*  CREATININE 1.53* 2.28* 1.96*  CALCIUM 9.7 9.8 8.9  MG  --  2.2  --    GFR Estimated Creatinine Clearance: 52.6 mL/min (A) (by C-G formula based on SCr of 1.96 mg/dL (H)). Liver Function Tests: Recent Labs  Lab 09/07/22 1725 09/11/22 0541  AST 26 20  ALT 31 31  ALKPHOS 92 65  BILITOT 0.8 0.7  PROT 7.6 6.5  ALBUMIN 4.0 3.4*   No results for input(s): "LIPASE", "AMYLASE" in the last 168 hours. No results for input(s): "AMMONIA" in the last 168 hours. Coagulation profile Recent Labs  Lab 09/11/22 0819  INR 1.1    CBC: Recent Labs  Lab 09/07/22 1725 09/10/22 1846 09/11/22 0541  WBC 7.5 9.1 7.3  NEUTROABS 5.0  --   --   HGB 15.8 15.5 14.1  HCT 47.6 47.0 41.9  MCV 83.8 84.2 83.0  PLT 242 273 205   Cardiac Enzymes: No results for input(s): "CKTOTAL", "CKMB", "CKMBINDEX", "TROPONINI" in the last 168 hours. BNP (last 3 results) No results for input(s): "PROBNP" in the last 8760 hours. CBG: Recent Labs  Lab 09/11/22 0859  GLUCAP 94   D-Dimer: No results for input(s): "DDIMER" in the last 72 hours. Hgb A1c: No results for input(s): "HGBA1C" in the last 72 hours. Lipid Profile: No results for input(s): "CHOL", "HDL", "LDLCALC", "TRIG", "CHOLHDL", "LDLDIRECT" in the last 72 hours. Thyroid function  studies: No results for input(s): "TSH", "T4TOTAL", "T3FREE", "THYROIDAB" in the last 72 hours.  Invalid input(s): "FREET3" Anemia work up: No results for input(s): "VITAMINB12", "FOLATE", "FERRITIN", "TIBC", "IRON", "RETICCTPCT" in the last 72 hours. Sepsis Labs: Recent Labs  Lab 09/07/22 1725 09/10/22 1846 09/11/22 0541  WBC 7.5 9.1 7.3    Microbiology Recent Results (from the past 240 hour(s))  C Difficile Quick Screen w PCR reflex     Status: None   Collection Time: 09/11/22 12:30 AM   Specimen: STOOL  Result Value Ref Range Status   C Diff antigen NEGATIVE NEGATIVE Final   C Diff toxin NEGATIVE NEGATIVE Final   C Diff interpretation No C. difficile detected.  Final    Comment: Performed at [facility], [address] [place], [place] [address]  Gastrointestinal Panel by PCR , Stool     Status: None   Collection Time: 09/11/22 12:30 AM   Specimen: Stool  Result Value Ref Range Status   Campylobacter species NOT DETECTED NOT DETECTED Final   Plesimonas shigelloides NOT DETECTED NOT DETECTED Final   Salmonella species NOT DETECTED NOT DETECTED Final   Yersinia enterocolitica NOT DETECTED NOT DETECTED Final   Vibrio species NOT DETECTED NOT DETECTED Final   Vibrio cholerae NOT DETECTED NOT DETECTED Final   Enteroaggregative E coli (EAEC) NOT DETECTED NOT DETECTED Final   Enteropathogenic E coli (EPEC) NOT DETECTED NOT DETECTED Final   Enterotoxigenic E coli (ETEC) NOT DETECTED NOT DETECTED Final   Shiga like toxin producing E coli (STEC) NOT DETECTED NOT DETECTED Final   Shigella/Enteroinvasive E coli (EIEC) NOT DETECTED NOT DETECTED Final   Cryptosporidium NOT DETECTED NOT DETECTED Final   Cyclospora cayetanensis NOT DETECTED NOT DETECTED Final   Entamoeba histolytica NOT DETECTED NOT DETECTED Final   Giardia lamblia NOT DETECTED NOT DETECTED Final   Adenovirus F40/41 NOT DETECTED NOT DETECTED Final   Astrovirus NOT DETECTED NOT DETECTED Final   Norovirus  GI/GII NOT DETECTED NOT DETECTED Final   Rotavirus  A NOT DETECTED NOT DETECTED Final   Sapovirus (I, II, IV, and V) NOT DETECTED NOT DETECTED Final    Comment: Performed at [facility], [address] [place] [place], [place] [address]    Procedures and diagnostic studies:  ECHOCARDIOGRAM COMPLETE  Result Date: 09/11/2022    ECHOCARDIOGRAM REPORT   Patient Name:   KHYRAN RIERA [facility] Date of Exam: 09/11/2022 Medical Rec #:  [id number]         Height:       70.0 in Accession #:    [id number]        Weight:       229.3 lb Date of Birth:  1970/10/21          BSA:          2.212 m Patient Age:    51 years          BP:           128/83 mmHg Patient Gender: M                 HR:           50 bpm. Exam Location:  [facility] Procedure: 2D Echo, Cardiac Doppler and Color Doppler Indications:     Acute ischemic heart disease, unspecified I24.9  History:         Patient has prior history of Echocardiogram examinations, most                  recent 10/07/2021. CAD; Risk Factors:Hypertension.  Sonographer:     Cristela Blue Referring Phys:  [id number] Antonieta Iba Diagnosing Phys: Julien Nordmann MD IMPRESSIONS  1. Left ventricular ejection fraction, by estimation, is 45 to 50%. The left ventricle has mildly decreased function. The left ventricle demonstrates regional wall motion abnormalities (hypokinesis of the inferior and inferolateral wall). There is moderate left ventricular hypertrophy. Left ventricular diastolic parameters are consistent with Grade I diastolic dysfunction (impaired relaxation).  2. Right ventricular systolic function is normal. The right ventricular size is normal. Tricuspid regurgitation signal is inadequate for assessing PA pressure.  3. The mitral valve is normal in structure. No evidence of mitral valve regurgitation. No evidence of mitral stenosis.  4. The aortic valve is tricuspid. Aortic valve regurgitation is not visualized. No aortic stenosis is present.  5. The inferior vena cava is normal in size  with greater than 50% respiratory variability, suggesting right atrial pressure of 3 mmHg. FINDINGS  Left Ventricle: Left ventricular ejection fraction, by estimation, is 45 to 50%. The left ventricle has mildly decreased function. The left ventricle demonstrates regional wall motion abnormalities. The left ventricular internal cavity size was normal in size. There is moderate left ventricular hypertrophy. Left ventricular diastolic parameters are consistent with Grade I diastolic dysfunction (impaired relaxation). Right Ventricle: The right ventricular size is normal. No increase in right ventricular wall thickness. Right ventricular systolic function is normal. Tricuspid regurgitation signal is inadequate for assessing PA pressure. Left Atrium: Left atrial size was normal in size. Right Atrium: Right atrial size was normal in size. Pericardium: There is no evidence of pericardial effusion. Mitral Valve: The mitral valve is normal in structure. No evidence of mitral valve regurgitation. No evidence of mitral valve stenosis. MV peak gradient, 2.7 mmHg. The mean mitral valve gradient is 1.0 mmHg. Tricuspid Valve: The tricuspid valve is normal in structure. Tricuspid valve regurgitation is not demonstrated. No evidence of tricuspid stenosis. Aortic Valve: The aortic valve is tricuspid. Aortic valve regurgitation is not  visualized. No aortic stenosis is present. Aortic valve mean gradient measures 3.0 mmHg. Aortic valve peak gradient measures 4.4 mmHg. Aortic valve area, by VTI measures 3.28 cm. Pulmonic Valve: The pulmonic valve was normal in structure. Pulmonic valve regurgitation is not visualized. No evidence of pulmonic stenosis. Aorta: The aortic root is normal in size and structure. Venous: The inferior vena cava is normal in size with greater than 50% respiratory variability, suggesting right atrial pressure of 3 mmHg. IAS/Shunts: No atrial level shunt detected by color flow Doppler.  LEFT VENTRICLE PLAX 2D  LVIDd:         5.10 cm      Diastology LVIDs:         4.20 cm      LV e' medial:    5.00 cm/s LV PW:         1.30 cm      LV E/e' medial:  10.3 LV IVS:        2.10 cm      LV e' lateral:   4.24 cm/s LVOT diam:     2.30 cm      LV E/e' lateral: 12.1 LV SV:         65 LV SV Index:   29 LVOT Area:     4.15 cm  LV Volumes (MOD) LV vol d, MOD A2C: 177.0 ml LV vol d, MOD A4C: 160.0 ml LV vol s, MOD A2C: 82.9 ml LV vol s, MOD A4C: 73.6 ml LV SV MOD A2C:     94.1 ml LV SV MOD A4C:     160.0 ml LV SV MOD BP:      97.2 ml RIGHT VENTRICLE RV Basal diam:  4.10 cm RV Mid diam:    3.00 cm RV S prime:     13.30 cm/s TAPSE (M-mode): 1.9 cm LEFT ATRIUM             Index        RIGHT ATRIUM           Index LA diam:        3.30 cm 1.49 cm/m   RA Area:     16.40 cm LA Vol (A2C):   43.6 ml 19.71 ml/m  RA Volume:   44.50 ml  20.12 ml/m LA Vol (A4C):   31.8 ml 14.38 ml/m LA Biplane Vol: 39.6 ml 17.90 ml/m  AORTIC VALVE AV Area (Vmax):    2.90 cm AV Area (Vmean):   2.67 cm AV Area (VTI):     3.28 cm AV Vmax:           105.00 cm/s AV Vmean:          77.100 cm/s AV VTI:            0.199 m AV Peak Grad:      4.4 mmHg AV Mean Grad:      3.0 mmHg LVOT Vmax:         73.40 cm/s LVOT Vmean:        49.600 cm/s LVOT VTI:          0.157 m LVOT/AV VTI ratio: 0.79  AORTA Ao Root diam: 3.50 cm MITRAL VALVE MV Area (PHT): 2.52 cm    SHUNTS MV Area VTI:   1.91 cm    Systemic VTI:  0.16 m MV Peak grad:  2.7 mmHg    Systemic Diam: 2.30 cm MV Mean grad:  1.0 mmHg MV Vmax:       0.81 m/s  MV Vmean:      49.2 cm/s MV Decel Time: 301 msec MV E velocity: 51.40 cm/s MV A velocity: 75.50 cm/s MV E/A ratio:  0.68 Julien Nordmann MD Electronically signed by Julien Nordmann MD Signature Date/Time: 09/11/2022/1:31:03 PM    Final    CT ABDOMEN PELVIS WO CONTRAST  Result Date: 09/10/2022 CLINICAL DATA:  Abdominal pain, acute, nonlocalized h/o crohn's, diarrhea, abd pain EXAM: CT ABDOMEN AND PELVIS WITHOUT CONTRAST TECHNIQUE: Multidetector CT imaging of the  abdomen and pelvis was performed following the standard protocol without IV contrast. RADIATION DOSE REDUCTION: This exam was performed according to the departmental dose-optimization program which includes automated exposure control, adjustment of the mA and/or kV according to patient size and/or use of iterative reconstruction technique. COMPARISON:  10/10/2021 FINDINGS: Lower chest: No acute abnormality Hepatobiliary: Gallbladder contracted. No focal hepatic abnormality. Pancreas: No focal abnormality or ductal dilatation. Spleen: No focal abnormality.  Normal size. Adrenals/Urinary Tract: No adrenal abnormality. No focal renal abnormality. No stones or hydronephrosis. Urinary bladder is unremarkable. Stomach/Bowel: Prior colectomy. Residual Hartmann's pouch. Right lower quadrant ostomy. 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 No bowel obstruction. Vascular/Lymphatic: No evidence of aneurysm or adenopathy. Reproductive: No visible focal abnormality. Other: No free fluid or free air. Musculoskeletal: No acute bony abnormality. IMPRESSION: No acute findings. Prior colectomy. 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 No bowel obstruction. No change since prior study. Electronically Signed   By: Charlett Nose M.D.   On: 09/10/2022 23:20   DG Chest 2 View  Result Date: 09/10/2022 CLINICAL DATA:  Dizziness. EXAM: CHEST - 2 VIEW COMPARISON:  October 20, 2021 FINDINGS: The heart size and mediastinal contours are within normal limits. Both lungs are clear. The visualized skeletal structures are unremarkable. IMPRESSION: No active cardiopulmonary disease. Electronically Signed   By: Aram Candela M.D.   On: 09/10/2022 19:25               LOS: 1 day   Nimra Puccinelli  [facility]   Pager on [email or web address]. If 7PM-7AM, please contact night-coverage at [email or web address]     09/11/2022, 1:33 PM

## 2022-09-12 ENCOUNTER — Encounter: Payer: Self-pay | Admitting: Internal Medicine

## 2022-09-12 DIAGNOSIS — K591 Functional diarrhea: Secondary | ICD-10-CM | POA: Diagnosis not present

## 2022-09-12 DIAGNOSIS — I25118 Atherosclerotic heart disease of native coronary artery with other forms of angina pectoris: Secondary | ICD-10-CM | POA: Diagnosis not present

## 2022-09-12 DIAGNOSIS — E861 Hypovolemia: Secondary | ICD-10-CM | POA: Diagnosis not present

## 2022-09-12 DIAGNOSIS — I5022 Chronic systolic (congestive) heart failure: Secondary | ICD-10-CM | POA: Diagnosis not present

## 2022-09-12 DIAGNOSIS — N179 Acute kidney failure, unspecified: Secondary | ICD-10-CM | POA: Diagnosis not present

## 2022-09-12 DIAGNOSIS — K50918 Crohn's disease, unspecified, with other complication: Secondary | ICD-10-CM

## 2022-09-12 LAB — CBC
HCT: 43 % (ref 39.0–52.0)
Hemoglobin: 14.6 g/dL (ref 13.0–17.0)
MCH: 28 pg (ref 26.0–34.0)
MCHC: 34 g/dL (ref 30.0–36.0)
MCV: 82.4 fL (ref 80.0–100.0)
Platelets: 189 10*3/uL (ref 150–400)
RBC: 5.22 MIL/uL (ref 4.22–5.81)
RDW: 13.6 % (ref 11.5–15.5)
WBC: 6.2 10*3/uL (ref 4.0–10.5)
nRBC: 0 % (ref 0.0–0.2)

## 2022-09-12 LAB — BASIC METABOLIC PANEL
Anion gap: 5 (ref 5–15)
BUN: 24 mg/dL — ABNORMAL HIGH (ref 6–20)
CO2: 20 mmol/L — ABNORMAL LOW (ref 22–32)
Calcium: 8.8 mg/dL — ABNORMAL LOW (ref 8.9–10.3)
Chloride: 113 mmol/L — ABNORMAL HIGH (ref 98–111)
Creatinine, Ser: 1.27 mg/dL — ABNORMAL HIGH (ref 0.61–1.24)
GFR, Estimated: >60 mL/min (ref >60)
Glucose, Bld: 96 mg/dL (ref 70–99)
Potassium: 4.5 mmol/L (ref 3.5–5.1)
Sodium: 138 mmol/L (ref 135–145)

## 2022-09-12 MED ORDER — BUTALBITAL-APAP-CAFFEINE 50-325-40 MG PO TABS
2.0000 | ORAL_TABLET | Freq: Once | ORAL | Status: AC
  Administered 2022-09-12: 2 via ORAL
  Filled 2022-09-12: qty 2

## 2022-09-12 MED ORDER — ISOSORBIDE MONONITRATE ER 30 MG PO TB24
15.0000 mg | ORAL_TABLET | Freq: Every day | ORAL | Status: DC
  Administered 2022-09-12 – 2022-09-13 (×2): 15 mg via ORAL
  Filled 2022-09-12 (×2): qty 1

## 2022-09-12 NOTE — Plan of Care (Signed)
  Problem: Education: Goal: Knowledge of General Education information will improve Description: Including pain rating scale, medication(s)/side effects and non-pharmacologic comfort measures Outcome: Progressing   Problem: Clinical Measurements: Goal: Respiratory complications will improve Outcome: Progressing Goal: Cardiovascular complication will be avoided Outcome: Progressing   Problem: Activity: Goal: Risk for activity intolerance will decrease Outcome: Progressing   Problem: Nutrition: Goal: Adequate nutrition will be maintained Outcome: Progressing   Problem: Coping: Goal: Level of anxiety will decrease Outcome: Progressing   Problem: Elimination: Goal: Will not experience complications related to bowel motility Outcome: Progressing Goal: Will not experience complications related to urinary retention Outcome: Progressing   Problem: Pain Managment: Goal: General experience of comfort will improve Outcome: Progressing   Problem: Safety: Goal: Ability to remain free from injury will improve Outcome: Progressing   Problem: Skin Integrity: Goal: Risk for impaired skin integrity will decrease Outcome: Progressing   

## 2022-09-12 NOTE — Progress Notes (Signed)
Rounding Note    Patient Name: Clarence [name] Date of Encounter: 09/12/2022  [facility] Cardiologist: Yvonne Kendall, MD   Subjective   AM labs pending. He reported very dull chest pain overnight. He had a headache as well. GI issues have resolved.  Inpatient Medications    Scheduled Meds:  aspirin EC  81 mg Oral Daily   atorvastatin  80 mg Oral QPM   carvedilol  12.5 mg Oral BID WC   clopidogrel  75 mg Oral Daily   ezetimibe  10 mg Oral Daily   fenofibrate  54 mg Oral Daily   heparin injection (subcutaneous)  5,000 Units Subcutaneous Q12H   sodium chloride flush  3 mL Intravenous Q12H   Continuous Infusions:  lactated ringers 75 mL/hr at 09/11/22 2227   PRN Meds: acetaminophen **OR** acetaminophen, morphine injection, nitroGLYCERIN   Vital Signs    Vitals:   09/12/22 0415 09/12/22 0500 09/12/22 0530 09/12/22 0613  BP: 120/80     Pulse: (!) 55     Resp: 19  20 17   Temp: 98.7 F (37.1 C)     TempSrc:      SpO2: 97%     Weight:  104.3 kg    Height:        Intake/Output Summary (Last 24 hours) at 09/12/2022 0713 Last data filed at 09/12/2022 0044 Gross per 24 hour  Intake 1751.19 ml  Output --  Net 1751.19 ml      09/12/2022    5:00 AM 09/11/2022    1:15 AM 09/10/2022    2:12 PM  Last 3 Weights  Weight (lbs) 229 lb 15 oz 229 lb 4.5 oz 227 lb 2 oz  Weight (kg) 104.3 kg 104 kg 103.023 kg      Telemetry    SB. HR 50s, upper 40s - Personally Reviewed  ECG    No new - Personally Reviewed  Physical Exam   GEN: No acute distress.   Neck: No JVD Cardiac: RRR, no murmurs, rubs, or gallops.  Respiratory: Clear to auscultation bilaterally. GI: Soft, nontender, non-distended  MS: No edema; No deformity. Neuro:  Nonfocal  Psych: Normal affect   Labs    High Sensitivity Troponin:   Recent Labs  Lab 09/11/22 0730 09/11/22 0937  TROPONINIHS 40* 49*     Chemistry Recent Labs  Lab 09/07/22 1725 09/10/22 1846 09/11/22 0541  NA  139 137 138  K 4.6 5.3* 4.5  CL 107 109 112*  CO2 24 23 20*  GLUCOSE 96 123* 120*  BUN 26* 36* 36*  CREATININE 1.53* 2.28* 1.96*  CALCIUM 9.7 9.8 8.9  MG  --  2.2  --   PROT 7.6  --  6.5  ALBUMIN 4.0  --  3.4*  AST 26  --  20  ALT 31  --  31  ALKPHOS 92  --  65  BILITOT 0.8  --  0.7  GFRNONAA 54* 33* 40*  ANIONGAP 8 5 6     Lipids No results for input(s): "CHOL", "TRIG", "HDL", "LABVLDL", "LDLCALC", "CHOLHDL" in the last 168 hours.  Hematology Recent Labs  Lab 09/07/22 1725 09/10/22 1846 09/11/22 0541  WBC 7.5 9.1 7.3  RBC 5.68 5.58 5.05  HGB 15.8 15.5 14.1  HCT 47.6 47.0 41.9  MCV 83.8 84.2 83.0  MCH 27.8 27.8 27.9  MCHC 33.2 33.0 33.7  RDW 13.5 13.7 14.0  PLT 242 273 205   Thyroid No results for input(s): "TSH", "FREET4" in the last  168 hours.  BNPNo results for input(s): "BNP", "PROBNP" in the last 168 hours.  DDimer No results for input(s): "DDIMER" in the last 168 hours.   Radiology    ECHOCARDIOGRAM COMPLETE  Result Date: 09/11/2022    ECHOCARDIOGRAM REPORT   Patient Name:   Clarence [name] [facility] Date of Exam: 09/11/2022 Medical Rec #:  [id number]         Height:       70.0 in Accession #:    [id number]        Weight:       229.3 lb Date of Birth:  [birth year]          BSA:          2.212 m Patient Age:    51 years          BP:           128/83 mmHg Patient Gender: M                 HR:           50 bpm. Exam Location:  [facility] Procedure: 2D Echo, Cardiac Doppler and Color Doppler Indications:     Acute ischemic heart disease, unspecified I24.9  History:         Patient has prior history of Echocardiogram examinations, most                  recent 10/07/2021. CAD; Risk Factors:Hypertension.  Sonographer:     Cristela Blue Referring Phys:  [id number] Antonieta Iba Diagnosing Phys: Julien Nordmann MD IMPRESSIONS  1. Left ventricular ejection fraction, by estimation, is 45 to 50%. The left ventricle has mildly decreased function. The left ventricle demonstrates regional wall motion  abnormalities (hypokinesis of the inferior and inferolateral wall). There is moderate left ventricular hypertrophy. Left ventricular diastolic parameters are consistent with Grade I diastolic dysfunction (impaired relaxation).  2. Right ventricular systolic function is normal. The right ventricular size is normal. Tricuspid regurgitation signal is inadequate for assessing PA pressure.  3. The mitral valve is normal in structure. No evidence of mitral valve regurgitation. No evidence of mitral stenosis.  4. The aortic valve is tricuspid. Aortic valve regurgitation is not visualized. No aortic stenosis is present.  5. The inferior vena cava is normal in size with greater than 50% respiratory variability, suggesting right atrial pressure of 3 mmHg. FINDINGS  Left Ventricle: Left ventricular ejection fraction, by estimation, is 45 to 50%. The left ventricle has mildly decreased function. The left ventricle demonstrates regional wall motion abnormalities. The left ventricular internal cavity size was normal in size. There is moderate left ventricular hypertrophy. Left ventricular diastolic parameters are consistent with Grade I diastolic dysfunction (impaired relaxation). Right Ventricle: The right ventricular size is normal. No increase in right ventricular wall thickness. Right ventricular systolic function is normal. Tricuspid regurgitation signal is inadequate for assessing PA pressure. Left Atrium: Left atrial size was normal in size. Right Atrium: Right atrial size was normal in size. Pericardium: There is no evidence of pericardial effusion. Mitral Valve: The mitral valve is normal in structure. No evidence of mitral valve regurgitation. No evidence of mitral valve stenosis. MV peak gradient, 2.7 mmHg. The mean mitral valve gradient is 1.0 mmHg. Tricuspid Valve: The tricuspid valve is normal in structure. Tricuspid valve regurgitation is not demonstrated. No evidence of tricuspid stenosis. Aortic Valve: The aortic  valve is tricuspid. Aortic valve regurgitation is not visualized. No aortic stenosis is present. Aortic valve mean gradient  measures 3.0 mmHg. Aortic valve peak gradient measures 4.4 mmHg. Aortic valve area, by VTI measures 3.28 cm. Pulmonic Valve: The pulmonic valve was normal in structure. Pulmonic valve regurgitation is not visualized. No evidence of pulmonic stenosis. Aorta: The aortic root is normal in size and structure. Venous: The inferior vena cava is normal in size with greater than 50% respiratory variability, suggesting right atrial pressure of 3 mmHg. IAS/Shunts: No atrial level shunt detected by color flow Doppler.  LEFT VENTRICLE PLAX 2D LVIDd:         5.10 cm      Diastology LVIDs:         4.20 cm      LV e' medial:    5.00 cm/s LV PW:         1.30 cm      LV E/e' medial:  10.3 LV IVS:        2.10 cm      LV e' lateral:   4.24 cm/s LVOT diam:     2.30 cm      LV E/e' lateral: 12.1 LV SV:         65 LV SV Index:   29 LVOT Area:     4.15 cm  LV Volumes (MOD) LV vol d, MOD A2C: 177.0 ml LV vol d, MOD A4C: 160.0 ml LV vol s, MOD A2C: 82.9 ml LV vol s, MOD A4C: 73.6 ml LV SV MOD A2C:     94.1 ml LV SV MOD A4C:     160.0 ml LV SV MOD BP:      97.2 ml RIGHT VENTRICLE RV Basal diam:  4.10 cm RV Mid diam:    3.00 cm RV S prime:     13.30 cm/s TAPSE (M-mode): 1.9 cm LEFT ATRIUM             Index        RIGHT ATRIUM           Index LA diam:        3.30 cm 1.49 cm/m   RA Area:     16.40 cm LA Vol (A2C):   43.6 ml 19.71 ml/m  RA Volume:   44.50 ml  20.12 ml/m LA Vol (A4C):   31.8 ml 14.38 ml/m LA Biplane Vol: 39.6 ml 17.90 ml/m  AORTIC VALVE AV Area (Vmax):    2.90 cm AV Area (Vmean):   2.67 cm AV Area (VTI):     3.28 cm AV Vmax:           105.00 cm/s AV Vmean:          77.100 cm/s AV VTI:            0.199 m AV Peak Grad:      4.4 mmHg AV Mean Grad:      3.0 mmHg LVOT Vmax:         73.40 cm/s LVOT Vmean:        49.600 cm/s LVOT VTI:          0.157 m LVOT/AV VTI ratio: 0.79  AORTA Ao Root diam: 3.50 cm  MITRAL VALVE MV Area (PHT): 2.52 cm    SHUNTS MV Area VTI:   1.91 cm    Systemic VTI:  0.16 m MV Peak grad:  2.7 mmHg    Systemic Diam: 2.30 cm MV Mean grad:  1.0 mmHg MV Vmax:       0.81 m/s MV Vmean:      49.2 cm/s MV  Decel Time: 301 msec MV E velocity: 51.40 cm/s MV A velocity: 75.50 cm/s MV E/A ratio:  0.68 Julien Nordmann MD Electronically signed by Julien Nordmann MD Signature Date/Time: 09/11/2022/1:31:03 PM    Final    CT ABDOMEN PELVIS WO CONTRAST  Result Date: 09/10/2022 CLINICAL DATA:  Abdominal pain, acute, nonlocalized h/o crohn's, diarrhea, abd pain EXAM: CT ABDOMEN AND PELVIS WITHOUT CONTRAST TECHNIQUE: Multidetector CT imaging of the abdomen and pelvis was performed following the standard protocol without IV contrast. RADIATION DOSE REDUCTION: This exam was performed according to the departmental dose-optimization program which includes automated exposure control, adjustment of the mA and/or kV according to patient size and/or use of iterative reconstruction technique. COMPARISON:  10/10/2021 FINDINGS: Lower chest: No acute abnormality Hepatobiliary: Gallbladder contracted. No focal hepatic abnormality. Pancreas: No focal abnormality or ductal dilatation. Spleen: No focal abnormality.  Normal size. Adrenals/Urinary Tract: No adrenal abnormality. No focal renal abnormality. No stones or hydronephrosis. Urinary bladder is unremarkable. Stomach/Bowel: Prior colectomy. Residual Hartmann's pouch. Right lower quadrant ostomy. 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 No bowel obstruction. Vascular/Lymphatic: No evidence of aneurysm or adenopathy. Reproductive: No visible focal abnormality. Other: No free fluid or free air. Musculoskeletal: No acute bony abnormality. IMPRESSION: No acute findings. Prior colectomy. 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 No bowel obstruction. No change since prior study. Electronically  Signed   By: Charlett Nose M.D.   On: 09/10/2022 23:20   DG Chest 2 View  Result Date: 09/10/2022 CLINICAL DATA:  Dizziness. EXAM: CHEST - 2 VIEW COMPARISON:  October 20, 2021 FINDINGS: The heart size and mediastinal contours are within normal limits. Both lungs are clear. The visualized skeletal structures are unremarkable. IMPRESSION: No active cardiopulmonary disease. Electronically Signed   By: Aram Candela M.D.   On: 09/10/2022 19:25    Cardiac Studies   Echo 09/2021 1. Left ventricular ejection fraction, by estimation, is 35 to 40%. The  left ventricle has moderately decreased function. The left ventricle  demonstrates global hypokinesis. There is moderate left ventricular  hypertrophy. Left ventricular diastolic  parameters are consistent with Grade I diastolic dysfunction (impaired  relaxation).   2. Right ventricular systolic function is normal. The right ventricular  size is normal.   3. Right atrial size was mildly dilated.   4. The mitral valve is normal in structure. Mild mitral valve  regurgitation. No evidence of mitral stenosis.   5. The aortic valve is tricuspid. Aortic valve regurgitation is not  visualized. No aortic stenosis is present.   6. There is mild dilatation of the ascending aorta, measuring 40 mm.    Cardiac cath 07/2021 Conclusions: Diffuse moderate-severe, multivessel coronary artery disease, including chronic total occlusion of mid RCA, diffuse plaquing of LAD with up to 60% stenosis in the mid vessel, 60-70% proximal/mid LCx stenosis, and 90% lesion of OM1. Moderately elevated left heart filling pressures (LVEDP/PCWP 25 mmHg). Severely elevated right heart filling pressures (mean RA 16 mmHg, RVEDP 30 mmHg). Moderate-severe pulmonary hypertension (mean PAP 42 mmHg, PVR 4.0 WU). Moderately reduced Fick cardiac output/index (CO 4.2 L/min, CI 2.0 L/min/m^2).Marland Kitchen   Recommendations: Suspect cardiomyopathy is a combination of ischemic and nonischemic etiologies  confounded by significant pulmonary hypertension and right heart failure that may be due to underlying lung disease.  Recommend continued diuresis and optimization of GDMT for acute HFrEF.  In the setting of ongoing wheezing with concern for obstructive lung disease, I will hold carvedilol for  now and add Entresto. Secondary prevention of coronary artery disease, including high-intensity statin therapy.  Diffuse nature of CAD is not ideal for percutaneous or surgical revascularization.  Mild troponin elevation on admission is most likely due to demand ischemia in the setting of acute HFrEF and chronic ischemic heart disease.   Yvonne Kendall, MD [facility]     Recommendations   Antiplatelet/Anticoag Continue indefinite aspirin 81 mg daily.  Defer addition of P2Y12 inhibitor as coronary artery disease is likely chronic with mild troponin elevation reflecting demand ischemia.      Echo 07/2021  1. Left ventricular ejection fraction, by estimation, is 30 to 35%. The  left ventricle has moderately decreased function. The left ventricle  demonstrates global hypokinesis. There is moderate left ventricular  hypertrophy. Left ventricular diastolic  parameters are consistent with Grade II diastolic dysfunction  (pseudonormalization).   2. Right ventricular systolic function is normal. The right ventricular  size is normal. Tricuspid regurgitation signal is inadequate for assessing  PA pressure.   3. Left atrial size was mildly dilated.   4. The mitral valve is abnormal. Mild to moderate mitral valve  regurgitation. No evidence of mitral stenosis.   5. The aortic valve has an indeterminant number of cusps. Aortic valve  regurgitation is not visualized. No aortic stenosis is present.   6. The inferior vena cava is dilated in size with <50% respiratory  variability, suggesting right atrial pressure of 15 mmHg.     Patient Profile     52 y.o. male with a hx of CAD, chronic systolic CHF,  Crohn's disease s/p colectomy, CKD stage 3, HTN who is being seen 09/11/2022 for the evaluation of chest pain   Assessment & Plan    Chest pain CAD - admitted with hypotension and AKI - on 4/26 he reported chest pain resolved with NTG. EKG was unremarkable - initial HS trop 40>49, flat trend and not consistent with ACS - no plan for IV heparin - cath in 07/2021 showed diffuse moderate-severe multivessel CAD, including CTO mRCA, diffuse plaquing of the LAD with up to 60% stenosis in the mid vessel, 60-70% proximal/mid Lcx stenosis, and 90% lesion of OM1. Diffuse CAD not ideal for PCI or surgical intervention. - echo this admission showed LVEF 45-50%, G1DD. He h/o reduced LVEF - very dull chest pain overnight - continue Aspirin, Plavix, Lipitor, Zetia, fenofibrate, SL NTG and coreg - no further cardiac work-up at this time   AKI - improving with fluids, AM las pending - Scr baseline around 1.3   Chronic HFrEF ICM/NCIM - most recent echo showed LVEF 35-40%, G1DD, mild MR, mild aortic dilation measuring 40mm.  - echo showed admission showed improved LVEF 45-50%, G1DD - PTA Entresto, Jardiance, spiro, and lasix held for AKI - continue Coreg - restart PTA medications as able   HTN - BP normal - continue coreg 12.5mg  BID, restart other PTA meds as kidney function improves   Crohn's disease s/p colostomy Diarrhea - GI consulted, no plan for work-up  For questions or updates, please contact [facility] Please consult [email or web address] for contact info under        Signed, Leahna Hewson David Stall, PA-C  09/12/2022, 7:13 AM

## 2022-09-12 NOTE — Progress Notes (Signed)
Progress Note    Clarence [name]  AVW:[id number] DOB: [birth year]  DOA: 09/10/2022 PCP: Caesar Bookman, NP      Brief Narrative:    Medical records reviewed and are as summarized below:  Clarence [name] is a 52 y.o. male with medical history significant for Crohn's disease, s/p colectomy and colostomy 2009 at [facility], CAD, hypertension, chronic systolic CHF (EF 35 to 40%), CKD stage IIIa, who presented to the hospital with dizziness, nausea, shortness of breath, fatigue and 1 week history of diarrhea.  He had gone for his cardiac rehab session on 09/09/2022 where he was found to be hypotensive with blood pressure in the upper 80s.  Rehab session was canceled.  He had also been seen in the emergency department on 09/07/2022 because of hypotension.  He was advised to hold his Lasix (for 3 days) at that time.  On the day of admission, he had gone to see his cardiologist who recommended decreasing antihypertensives.  He presented to the emergency department because of worsening symptoms.  On presentation, he was hypotensive with systolic BP in the 90s.  He was found to have acute kidney injury due to dehydration from diarrhea..    Assessment/Plan:   Principal Problem:   Dizziness Active Problems:   Essential hypertension   Chronic HFrEF (heart failure with reduced ejection fraction) (HCC)   Crohn's disease (HCC)   AKI (acute kidney injury) (HCC)   Coronary artery disease of native artery of native heart with stable angina pectoris (HCC)   Chest pain   Hypotension   Functional diarrhea    Body mass index is 32.99 kg/m.  (Obesity)   AKI on CKD stage IIIa, mild hyperkalemia: Improved.  Creatinine is at baseline.  Hyperkalemia has resolved.  Discontinue IV fluids   Dizziness from hypotension: BP is better.  Hold Lasix, Entresto and Aldactone.   Chest pain with history of CAD: Chest pain has resolved.  Troponins were mildly elevated but flat (40, 49).  EKG showed  sinus bradycardia with T wave inversions in leads I and aVL.  2D echo has been ordered.  Continue isosorbide mononitrate, aspirin, Plavix and Lipitor.    Right and left heart cath report on 08/07/2021 as follows:  Conclusions: Diffuse moderate-severe, multivessel coronary artery disease, including chronic total occlusion of mid RCA, diffuse plaquing of LAD with up to 60% stenosis in the mid vessel, 60-70% proximal/mid LCx stenosis, and 90% lesion of OM1. Moderately elevated left heart filling pressures (LVEDP/PCWP 25 mmHg). Severely elevated right heart filling pressures (mean RA 16 mmHg, RVEDP 30 mmHg). Moderate-severe pulmonary hypertension (mean PAP 42 mmHg, PVR 4.0 WU). Moderately reduced Fick cardiac output/index (CO 4.2 L/min, CI 2.0 L/min/m^2).Marland Kitchen   Recommendations: Suspect cardiomyopathy is a combination of ischemic and nonischemic etiologies confounded by significant pulmonary hypertension and right heart failure that may be due to underlying lung disease.  Recommend continued diuresis and optimization of GDMT for acute HFrEF.  In the setting of ongoing wheezing with concern for obstructive lung disease, I will hold carvedilol for now and add Entresto. Secondary prevention of coronary artery disease, including high-intensity statin therapy.  Diffuse nature of CAD is not ideal for percutaneous or surgical revascularization.  Mild troponin elevation on admission is most likely due to demand ischemia in the setting of acute HFrEF and chronic ischemic heart disease.    Chronic systolic CHF: Continue carvedilol.  Lasix, Entresto and Aldactone have been held.  2D echo in May 2023 showed EF  of 35 to 40%, grade 1 diastolic dysfunction   Acute diarrheal illness, history of Crohn's disease s/p colostomy: Stool for C. difficile toxin and GI panel were negative.  CRP was normal, fecal calprotectin is pending.  GI has signed off.    Diet Order             Diet Heart Room service appropriate?  Yes; Fluid consistency: Thin  Diet effective now                            Consultants: Cardiology Gastroenterologist  Procedures: None    Medications:    aspirin EC  81 mg Oral Daily   atorvastatin  80 mg Oral QPM   carvedilol  12.5 mg Oral BID WC   clopidogrel  75 mg Oral Daily   ezetimibe  10 mg Oral Daily   fenofibrate  54 mg Oral Daily   heparin injection (subcutaneous)  5,000 Units Subcutaneous Q12H   isosorbide mononitrate  15 mg Oral Daily   sodium chloride flush  3 mL Intravenous Q12H   Continuous Infusions:     Anti-infectives (From admission, onward)    None              Family Communication/Anticipated D/C date and plan/Code Status   DVT prophylaxis: heparin injection 5,000 Units Start: 09/11/22 2200     Code Status: Full Code  Family Communication: None Disposition Plan: Plan to discharge home in 2 to 3 days   Status is: Inpatient Remains inpatient appropriate because: AKI, hypotension and chest pain       Subjective:   Interval events noted.  No shortness of breath, chest pain, vomiting or diarrhea   Objective:    Vitals:   09/12/22 0613 09/12/22 0850 09/12/22 1007 09/12/22 1157  BP:  121/87  135/86  Pulse:  60 (!) 58 (!) 56  Resp: 17 16  16   Temp:  97.7 F (36.5 C)  98 F (36.7 C)  TempSrc:  Oral  Oral  SpO2:  100%  99%  Weight:      Height:       No data found.   Intake/Output Summary (Last 24 hours) at 09/12/2022 1424 Last data filed at 09/12/2022 0044 Gross per 24 hour  Intake 1751.19 ml  Output --  Net 1751.19 ml   Filed Weights   09/11/22 0115 09/12/22 0500  Weight: 104 kg 104.3 kg    Exam:  GEN: NAD SKIN: No rash EYES: EOMI ENT: MMM CV: RRR PULM: CTA B ABD: soft, ND, NT, +BS, + colostomy with scanty amount of stools CNS: AAO x 3, non focal EXT: No edema or tenderness      Data Reviewed:   I have personally reviewed following labs and imaging studies:  Labs: Labs  show the following:   Basic Metabolic Panel: Recent Labs  Lab 09/07/22 1725 09/10/22 1846 09/11/22 0541 09/12/22 0755  NA 139 137 138 138  K 4.6 5.3* 4.5 4.5  CL 107 109 112* 113*  CO2 24 23 20* 20*  GLUCOSE 96 123* 120* 96  BUN 26* 36* 36* 24*  CREATININE 1.53* 2.28* 1.96* 1.27*  CALCIUM 9.7 9.8 8.9 8.8*  MG  --  2.2  --   --    GFR Estimated Creatinine Clearance: 81.3 mL/min (A) (by C-G formula based on SCr of 1.27 mg/dL (H)). Liver Function Tests: Recent Labs  Lab 09/07/22 1725 09/11/22 0541  AST 26 20  ALT 31 31  ALKPHOS 92 65  BILITOT 0.8 0.7  PROT 7.6 6.5  ALBUMIN 4.0 3.4*   No results for input(s): "LIPASE", "AMYLASE" in the last 168 hours. No results for input(s): "AMMONIA" in the last 168 hours. Coagulation profile Recent Labs  Lab 09/11/22 0819  INR 1.1    CBC: Recent Labs  Lab 09/07/22 1725 09/10/22 1846 09/11/22 0541 09/12/22 0755  WBC 7.5 9.1 7.3 6.2  NEUTROABS 5.0  --   --   --   HGB 15.8 15.5 14.1 14.6  HCT 47.6 47.0 41.9 43.0  MCV 83.8 84.2 83.0 82.4  PLT 242 273 205 189   Cardiac Enzymes: No results for input(s): "CKTOTAL", "CKMB", "CKMBINDEX", "TROPONINI" in the last 168 hours. BNP (last 3 results) No results for input(s): "PROBNP" in the last 8760 hours. CBG: Recent Labs  Lab 09/11/22 0859  GLUCAP 94   D-Dimer: No results for input(s): "DDIMER" in the last 72 hours. Hgb A1c: No results for input(s): "HGBA1C" in the last 72 hours. Lipid Profile: No results for input(s): "CHOL", "HDL", "LDLCALC", "TRIG", "CHOLHDL", "LDLDIRECT" in the last 72 hours. Thyroid function studies: No results for input(s): "TSH", "T4TOTAL", "T3FREE", "THYROIDAB" in the last 72 hours.  Invalid input(s): "FREET3" Anemia work up: No results for input(s): "VITAMINB12", "FOLATE", "FERRITIN", "TIBC", "IRON", "RETICCTPCT" in the last 72 hours. Sepsis Labs: Recent Labs  Lab 09/07/22 1725 09/10/22 1846 09/11/22 0541 09/12/22 0755  WBC 7.5 9.1 7.3  6.2    Microbiology Recent Results (from the past 240 hour(s))  C Difficile Quick Screen w PCR reflex     Status: None   Collection Time: 09/11/22 12:30 AM   Specimen: STOOL  Result Value Ref Range Status   C Diff antigen NEGATIVE NEGATIVE Final   C Diff toxin NEGATIVE NEGATIVE Final   C Diff interpretation No C. difficile detected.  Final    Comment: Performed at [facility], [address] [place], [place] [address]  Gastrointestinal Panel by PCR , Stool     Status: None   Collection Time: 09/11/22 12:30 AM   Specimen: Stool  Result Value Ref Range Status   Campylobacter species NOT DETECTED NOT DETECTED Final   Plesimonas shigelloides NOT DETECTED NOT DETECTED Final   Salmonella species NOT DETECTED NOT DETECTED Final   Yersinia enterocolitica NOT DETECTED NOT DETECTED Final   Vibrio species NOT DETECTED NOT DETECTED Final   Vibrio cholerae NOT DETECTED NOT DETECTED Final   Enteroaggregative E coli (EAEC) NOT DETECTED NOT DETECTED Final   Enteropathogenic E coli (EPEC) NOT DETECTED NOT DETECTED Final   Enterotoxigenic E coli (ETEC) NOT DETECTED NOT DETECTED Final   Shiga like toxin producing E coli (STEC) NOT DETECTED NOT DETECTED Final   Shigella/Enteroinvasive E coli (EIEC) NOT DETECTED NOT DETECTED Final   Cryptosporidium NOT DETECTED NOT DETECTED Final   Cyclospora cayetanensis NOT DETECTED NOT DETECTED Final   Entamoeba histolytica NOT DETECTED NOT DETECTED Final   Giardia lamblia NOT DETECTED NOT DETECTED Final   Adenovirus F40/41 NOT DETECTED NOT DETECTED Final   Astrovirus NOT DETECTED NOT DETECTED Final   Norovirus GI/GII NOT DETECTED NOT DETECTED Final   Rotavirus A NOT DETECTED NOT DETECTED Final   Sapovirus (I, II, IV, and V) NOT DETECTED NOT DETECTED Final    Comment: Performed at [facility], [address], [place], [place] [address]    Procedures and diagnostic studies:  ECHOCARDIOGRAM COMPLETE  Result Date: 09/11/2022     ECHOCARDIOGRAM REPORT  Patient Name:   LEGRANDE HAO [facility] Date of Exam: 09/11/2022 Medical Rec #:  [id number]         Height:       70.0 in Accession #:    [id number]        Weight:       229.3 lb Date of Birth:  02-26-1971          BSA:          2.212 m Patient Age:    51 years          BP:           128/83 mmHg Patient Gender: M                 HR:           50 bpm. Exam Location:  [facility] Procedure: 2D Echo, Cardiac Doppler and Color Doppler Indications:     Acute ischemic heart disease, unspecified I24.9  History:         Patient has prior history of Echocardiogram examinations, most                  recent 10/07/2021. CAD; Risk Factors:Hypertension.  Sonographer:     Cristela Blue Referring Phys:  [id number] Antonieta Iba Diagnosing Phys: Julien Nordmann MD IMPRESSIONS  1. Left ventricular ejection fraction, by estimation, is 45 to 50%. The left ventricle has mildly decreased function. The left ventricle demonstrates regional wall motion abnormalities (hypokinesis of the inferior and inferolateral wall). There is moderate left ventricular hypertrophy. Left ventricular diastolic parameters are consistent with Grade I diastolic dysfunction (impaired relaxation).  2. Right ventricular systolic function is normal. The right ventricular size is normal. Tricuspid regurgitation signal is inadequate for assessing PA pressure.  3. The mitral valve is normal in structure. No evidence of mitral valve regurgitation. No evidence of mitral stenosis.  4. The aortic valve is tricuspid. Aortic valve regurgitation is not visualized. No aortic stenosis is present.  5. The inferior vena cava is normal in size with greater than 50% respiratory variability, suggesting right atrial pressure of 3 mmHg. FINDINGS  Left Ventricle: Left ventricular ejection fraction, by estimation, is 45 to 50%. The left ventricle has mildly decreased function. The left ventricle demonstrates regional wall motion abnormalities. The left ventricular internal cavity size  was normal in size. There is moderate left ventricular hypertrophy. Left ventricular diastolic parameters are consistent with Grade I diastolic dysfunction (impaired relaxation). Right Ventricle: The right ventricular size is normal. No increase in right ventricular wall thickness. Right ventricular systolic function is normal. Tricuspid regurgitation signal is inadequate for assessing PA pressure. Left Atrium: Left atrial size was normal in size. Right Atrium: Right atrial size was normal in size. Pericardium: There is no evidence of pericardial effusion. Mitral Valve: The mitral valve is normal in structure. No evidence of mitral valve regurgitation. No evidence of mitral valve stenosis. MV peak gradient, 2.7 mmHg. The mean mitral valve gradient is 1.0 mmHg. Tricuspid Valve: The tricuspid valve is normal in structure. Tricuspid valve regurgitation is not demonstrated. No evidence of tricuspid stenosis. Aortic Valve: The aortic valve is tricuspid. Aortic valve regurgitation is not visualized. No aortic stenosis is present. Aortic valve mean gradient measures 3.0 mmHg. Aortic valve peak gradient measures 4.4 mmHg. Aortic valve area, by VTI measures 3.28 cm. Pulmonic Valve: The pulmonic valve was normal in structure. Pulmonic valve regurgitation is not visualized. No evidence of pulmonic stenosis. Aorta: The aortic root is normal in size  and structure. Venous: The inferior vena cava is normal in size with greater than 50% respiratory variability, suggesting right atrial pressure of 3 mmHg. IAS/Shunts: No atrial level shunt detected by color flow Doppler.  LEFT VENTRICLE PLAX 2D LVIDd:         5.10 cm      Diastology LVIDs:         4.20 cm      LV e' medial:    5.00 cm/s LV PW:         1.30 cm      LV E/e' medial:  10.3 LV IVS:        2.10 cm      LV e' lateral:   4.24 cm/s LVOT diam:     2.30 cm      LV E/e' lateral: 12.1 LV SV:         65 LV SV Index:   29 LVOT Area:     4.15 cm  LV Volumes (MOD) LV vol d, MOD  A2C: 177.0 ml LV vol d, MOD A4C: 160.0 ml LV vol s, MOD A2C: 82.9 ml LV vol s, MOD A4C: 73.6 ml LV SV MOD A2C:     94.1 ml LV SV MOD A4C:     160.0 ml LV SV MOD BP:      97.2 ml RIGHT VENTRICLE RV Basal diam:  4.10 cm RV Mid diam:    3.00 cm RV S prime:     13.30 cm/s TAPSE (M-mode): 1.9 cm LEFT ATRIUM             Index        RIGHT ATRIUM           Index LA diam:        3.30 cm 1.49 cm/m   RA Area:     16.40 cm LA Vol (A2C):   43.6 ml 19.71 ml/m  RA Volume:   44.50 ml  20.12 ml/m LA Vol (A4C):   31.8 ml 14.38 ml/m LA Biplane Vol: 39.6 ml 17.90 ml/m  AORTIC VALVE AV Area (Vmax):    2.90 cm AV Area (Vmean):   2.67 cm AV Area (VTI):     3.28 cm AV Vmax:           105.00 cm/s AV Vmean:          77.100 cm/s AV VTI:            0.199 m AV Peak Grad:      4.4 mmHg AV Mean Grad:      3.0 mmHg LVOT Vmax:         73.40 cm/s LVOT Vmean:        49.600 cm/s LVOT VTI:          0.157 m LVOT/AV VTI ratio: 0.79  AORTA Ao Root diam: 3.50 cm MITRAL VALVE MV Area (PHT): 2.52 cm    SHUNTS MV Area VTI:   1.91 cm    Systemic VTI:  0.16 m MV Peak grad:  2.7 mmHg    Systemic Diam: 2.30 cm MV Mean grad:  1.0 mmHg MV Vmax:       0.81 m/s MV Vmean:      49.2 cm/s MV Decel Time: 301 msec MV E velocity: 51.40 cm/s MV A velocity: 75.50 cm/s MV E/A ratio:  0.68 Julien Nordmann MD Electronically signed by Julien Nordmann MD Signature Date/Time: 09/11/2022/1:31:03 PM    Final    CT ABDOMEN PELVIS WO CONTRAST  Result  Date: 09/10/2022 CLINICAL DATA:  Abdominal pain, acute, nonlocalized h/o crohn's, diarrhea, abd pain EXAM: CT ABDOMEN AND PELVIS WITHOUT CONTRAST TECHNIQUE: Multidetector CT imaging of the abdomen and pelvis was performed following the standard protocol without IV contrast. RADIATION DOSE REDUCTION: This exam was performed according to the departmental dose-optimization program which includes automated exposure control, adjustment of the mA and/or kV according to patient size and/or use of iterative reconstruction technique.  COMPARISON:  10/10/2021 FINDINGS: Lower chest: No acute abnormality Hepatobiliary: Gallbladder contracted. No focal hepatic abnormality. Pancreas: No focal abnormality or ductal dilatation. Spleen: No focal abnormality.  Normal size. Adrenals/Urinary Tract: No adrenal abnormality. No focal renal abnormality. No stones or hydronephrosis. Urinary bladder is unremarkable. Stomach/Bowel: Prior colectomy. Residual Hartmann's pouch. Right lower quadrant ostomy. 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 No bowel obstruction. Vascular/Lymphatic: No evidence of aneurysm or adenopathy. Reproductive: No visible focal abnormality. Other: No free fluid or free air. Musculoskeletal: No acute bony abnormality. IMPRESSION: No acute findings. Prior colectomy. 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 No bowel obstruction. No change since prior study. Electronically Signed   By: Charlett Nose M.D.   On: 09/10/2022 23:20   DG Chest 2 View  Result Date: 09/10/2022 CLINICAL DATA:  Dizziness. EXAM: CHEST - 2 VIEW COMPARISON:  October 20, 2021 FINDINGS: The heart size and mediastinal contours are within normal limits. Both lungs are clear. The visualized skeletal structures are unremarkable. IMPRESSION: No active cardiopulmonary disease. Electronically Signed   By: Aram Candela M.D.   On: 09/10/2022 19:25               LOS: 2 days   Zamere Pasternak  [facility]   Pager on [email or web address]. If 7PM-7AM, please contact night-coverage at [email or web address]     09/12/2022, 2:24 PM

## 2022-09-13 DIAGNOSIS — N170 Acute kidney failure with tubular necrosis: Secondary | ICD-10-CM | POA: Diagnosis not present

## 2022-09-13 DIAGNOSIS — N1832 Chronic kidney disease, stage 3b: Secondary | ICD-10-CM | POA: Diagnosis not present

## 2022-09-13 DIAGNOSIS — R42 Dizziness and giddiness: Secondary | ICD-10-CM | POA: Diagnosis not present

## 2022-09-13 DIAGNOSIS — I5022 Chronic systolic (congestive) heart failure: Secondary | ICD-10-CM | POA: Diagnosis not present

## 2022-09-13 DIAGNOSIS — E861 Hypovolemia: Secondary | ICD-10-CM | POA: Diagnosis not present

## 2022-09-13 LAB — BASIC METABOLIC PANEL
Anion gap: 5 (ref 5–15)
BUN: 24 mg/dL — ABNORMAL HIGH (ref 6–20)
CO2: 23 mmol/L (ref 22–32)
Calcium: 9.2 mg/dL (ref 8.9–10.3)
Chloride: 113 mmol/L — ABNORMAL HIGH (ref 98–111)
Creatinine, Ser: 1.3 mg/dL — ABNORMAL HIGH (ref 0.61–1.24)
GFR, Estimated: >60 mL/min (ref >60)
Glucose, Bld: 94 mg/dL (ref 70–99)
Potassium: 4.3 mmol/L (ref 3.5–5.1)
Sodium: 141 mmol/L (ref 135–145)

## 2022-09-13 MED ORDER — ISOSORBIDE MONONITRATE ER 30 MG PO TB24
15.0000 mg | ORAL_TABLET | Freq: Every day | ORAL | 0 refills | Status: DC
  Filled 2022-09-13: qty 30, 60d supply, fill #0

## 2022-09-13 MED ORDER — SACUBITRIL-VALSARTAN 24-26 MG PO TABS: 1.0000 | ORAL_TABLET | Freq: Two times a day (BID) | ORAL | Status: DC

## 2022-09-13 MED ORDER — BUTALBITAL-APAP-CAFFEINE 50-325-40 MG PO TABS
1.0000 | ORAL_TABLET | Freq: Four times a day (QID) | ORAL | Status: DC | PRN
  Administered 2022-09-13: 1 via ORAL
  Filled 2022-09-13: qty 1

## 2022-09-13 MED ORDER — ENTRESTO 24-26 MG PO TABS
1.0000 | ORAL_TABLET | Freq: Two times a day (BID) | ORAL | 0 refills | Status: DC
  Filled 2022-09-13: qty 60, 30d supply, fill #0

## 2022-09-13 NOTE — Discharge Summary (Signed)
Physician Discharge Summary   Patient: Clarence [name] MRN: [id number] DOB: [birth year]  Admit date:     09/10/2022  Discharge date: 09/13/22  Discharge Physician: Lurene Shadow   PCP: Caesar Bookman, NP   Recommendations at discharge:  {Tip this will not be part of the note when signed- Example include specific recommendations for outpatient follow-up, pending tests to follow-up on. (Optional):[id number]} Follow-up with PCP in 1 to 2 weeks Follow-up with cardiologist (office will call to schedule appointment)  Discharge Diagnoses: Principal Problem:   Dizziness Active Problems:   Essential hypertension   Chronic HFrEF (heart failure with reduced ejection fraction) (HCC)   Crohn's disease (HCC)   AKI (acute kidney injury) (HCC)   Coronary artery disease of native artery of native heart with stable angina pectoris (HCC)   Chest pain   Hypotension   Functional diarrhea  Resolved Problems:   * No resolved hospital problems. *  Hospital Course:  Clarence DELAUGHTER is a 52 y.o. male with medical history significant for Crohn's disease, s/p colectomy and colostomy 2009 at [facility], CAD, hypertension, chronic systolic CHF (EF 35 to 40%), CKD stage IIIa, who presented to the hospital with dizziness, nausea, shortness of breath, fatigue and 1 week history of diarrhea.  He had gone for his cardiac rehab session on 09/09/2022 where he was found to be hypotensive with blood pressure in the upper 80s.  Rehab session was canceled.  He had also been seen in the emergency department on 09/07/2022 because of hypotension.  He was advised to hold his Lasix (for 3 days) at that time.  On the day of admission, he had gone to see his cardiologist who recommended decreasing antihypertensives.  He presented to the emergency department because of worsening symptoms.   On presentation, he was hypotensive with systolic BP in the 90s.  He was found to have acute kidney injury due to dehydration from diarrhea..      Assessment and Plan:   AKI on CKD stage IIIa, mild hyperkalemia: Improved.  Creatinine is at baseline.  Hyperkalemia has resolved.      Dizziness from hypotension: BP is better.      Chest pain with history of CAD: Chest pain has resolved.  Troponins were mildly elevated but flat (40, 49).  EKG showed sinus bradycardia with T wave inversions in leads I and aVL.  2D echo showed EF estimated at 45 to 50%, moderate LVH, grade 1 diastolic dysfunction.  Continue isosorbide mononitrate, aspirin, Plavix and Lipitor.      Right and left heart cath report on 08/07/2021 as follows:   Conclusions: Diffuse moderate-severe, multivessel coronary artery disease, including chronic total occlusion of mid RCA, diffuse plaquing of LAD with up to 60% stenosis in the mid vessel, 60-70% proximal/mid LCx stenosis, and 90% lesion of OM1. Moderately elevated left heart filling pressures (LVEDP/PCWP 25 mmHg). Severely elevated right heart filling pressures (mean RA 16 mmHg, RVEDP 30 mmHg). Moderate-severe pulmonary hypertension (mean PAP 42 mmHg, PVR 4.0 WU). Moderately reduced Fick cardiac output/index (CO 4.2 L/min, CI 2.0 L/min/m^2).Marland Kitchen   Recommendations: Suspect cardiomyopathy is a combination of ischemic and nonischemic etiologies confounded by significant pulmonary hypertension and right heart failure that may be due to underlying lung disease.  Recommend continued diuresis and optimization of GDMT for acute HFrEF.  In the setting of ongoing wheezing with concern for obstructive lung disease, I will hold carvedilol for now and add Entresto. Secondary prevention of coronary artery disease, including high-intensity statin therapy.  Diffuse nature of CAD is not ideal for percutaneous or surgical revascularization.  Mild troponin elevation on admission is most likely due to demand ischemia in the setting of acute HFrEF and chronic ischemic heart disease.       Chronic systolic CHF: Continue carvedilol.  Resume  Aldactone at discharge.  Sherryll Burger will be resumed at discharge but at a lower dose (24/26 mg strength).  Use Lasix as needed for fluid/swelling.  Discussed with Dr. Mariah Milling, cardiologist. 2D echo showed EF estimated at 45 to 50%, moderate LVH, grade 1 diastolic dysfunction.  2D echo in May 2023 showed EF of 35 to 40%, grade 1 diastolic dysfunction     Acute diarrheal illness, history of Crohn's disease s/p colostomy: Stool for C. difficile toxin and GI panel were negative.  CRP was normal, fecal calprotectin is pending.  GI has signed off.   His condition has improved and he is deemed stable for discharge home today.   {Tip this will not be part of the note when signed Body mass index is 31.85 kg/m. , ,  (Optional):26781}   Consultants: Cardiologist Procedures performed: None Disposition: Home Diet recommendation:  Discharge Diet Orders (From admission, onward)     Start     Ordered   09/13/22 0000  Diet - low sodium heart healthy        09/13/22 1238           Cardiac diet DISCHARGE MEDICATION: Allergies as of 09/13/2022       Reactions   Ibuprofen Hives, Other (See Comments)   Other reaction(s): GI Upset (intolerance) Hives Tolerates Advil Other reaction(s): Not available   Spironolactone    Hyperkalemia   Sulfacetamide Other (See Comments)   Neomycin-bacitracin Zn-polymyx    Other reaction(s): Other (See Comments)        Medication List     STOP taking these medications    Entresto 97-103 MG Generic drug: sacubitril-valsartan Replaced by: Sherryll Burger 24-26 MG       TAKE these medications    acetaminophen 325 MG tablet Commonly known as: TYLENOL Take 650 mg by mouth at bedtime.   Aspirin Low Dose 81 MG tablet Generic drug: aspirin EC Take 1 tablet (81 mg total) by mouth daily. Swallow whole.   atorvastatin 80 MG tablet Commonly known as: LIPITOR Take 1 tablet (80 mg total) by mouth every evening.   carvedilol 12.5 MG tablet Commonly known as:  COREG Take 1 tablet (12.5 mg total) by mouth 2 (two) times daily with a meal.   clopidogrel 75 MG tablet Commonly known as: PLAVIX Take 1 tablet (75 mg total) by mouth daily.   Entresto 24-26 MG Generic drug: sacubitril-valsartan Take 1 tablet by mouth 2 (two) times daily. Replaces: Entresto 97-103 MG   ezetimibe 10 MG tablet Commonly known as: ZETIA Take 1 tablet (10 mg total) by mouth daily.   famotidine 10 MG tablet Commonly known as: PEPCID Take 10 mg by mouth at bedtime. As needed   fenofibrate 48 MG tablet Commonly known as: Tricor Take 1 tablet (48 mg total) by mouth daily.   furosemide 20 MG tablet Commonly known as: LASIX Take 20 mg by mouth daily as needed. For weight gain of 3 lb in 24 hours or 5 lbs in one week   isosorbide mononitrate 30 MG 24 hr tablet Commonly known as: IMDUR Take 0.5 tablets (15 mg total) by mouth daily. Start taking on: September 14, 2022   Jardiance 10 MG Tabs tablet Generic drug: empagliflozin  Take 10 mg by mouth daily.   nitroGLYCERIN 0.4 MG SL tablet Commonly known as: Nitrostat Place 1 tablet (0.4 mg total) under the tongue every 5 (five) minutes as needed for chest pain.   ondansetron 4 MG tablet Commonly known as: ZOFRAN Take 4 mg by mouth every 8 (eight) hours as needed for nausea or vomiting.   spironolactone 25 MG tablet Commonly known as: ALDACTONE Take 12.5 mg by mouth daily.        Discharge Exam: Filed Weights   09/11/22 0115 09/12/22 0500 09/13/22 0150  Weight: 104 kg 104.3 kg 100.7 kg   GEN: NAD SKIN: Warm and dry EYES: No pallor or icterus ENT: MMM CV: RRR PULM: CTA B ABD: soft, ND, NT, +BS, + colostomy right lower quadrant (bag has small amount of stool) CNS: AAO x 3, non focal EXT: No edema or tenderness   Condition at discharge: good  The results of significant diagnostics from this hospitalization (including imaging, microbiology, ancillary and laboratory) are listed below for reference.    Imaging Studies: ECHOCARDIOGRAM COMPLETE  Result Date: 09/11/2022    ECHOCARDIOGRAM REPORT   Patient Name:   CONRAD ZAJKOWSKI [facility] Date of Exam: 09/11/2022 Medical Rec #:  [id number]         Height:       70.0 in Accession #:    [id number]        Weight:       229.3 lb Date of Birth:  1970/12/26          BSA:          2.212 m Patient Age:    51 years          BP:           128/83 mmHg Patient Gender: M                 HR:           50 bpm. Exam Location:  [facility] Procedure: 2D Echo, Cardiac Doppler and Color Doppler Indications:     Acute ischemic heart disease, unspecified I24.9  History:         Patient has prior history of Echocardiogram examinations, most                  recent 10/07/2021. CAD; Risk Factors:Hypertension.  Sonographer:     Cristela Blue Referring Phys:  [id number] Antonieta Iba Diagnosing Phys: Julien Nordmann MD IMPRESSIONS  1. Left ventricular ejection fraction, by estimation, is 45 to 50%. The left ventricle has mildly decreased function. The left ventricle demonstrates regional wall motion abnormalities (hypokinesis of the inferior and inferolateral wall). There is moderate left ventricular hypertrophy. Left ventricular diastolic parameters are consistent with Grade I diastolic dysfunction (impaired relaxation).  2. Right ventricular systolic function is normal. The right ventricular size is normal. Tricuspid regurgitation signal is inadequate for assessing PA pressure.  3. The mitral valve is normal in structure. No evidence of mitral valve regurgitation. No evidence of mitral stenosis.  4. The aortic valve is tricuspid. Aortic valve regurgitation is not visualized. No aortic stenosis is present.  5. The inferior vena cava is normal in size with greater than 50% respiratory variability, suggesting right atrial pressure of 3 mmHg. FINDINGS  Left Ventricle: Left ventricular ejection fraction, by estimation, is 45 to 50%. The left ventricle has mildly decreased function. The left ventricle demonstrates  regional wall motion abnormalities. The left ventricular internal cavity size was normal in size. There is moderate  left ventricular hypertrophy. Left ventricular diastolic parameters are consistent with Grade I diastolic dysfunction (impaired relaxation). Right Ventricle: The right ventricular size is normal. No increase in right ventricular wall thickness. Right ventricular systolic function is normal. Tricuspid regurgitation signal is inadequate for assessing PA pressure. Left Atrium: Left atrial size was normal in size. Right Atrium: Right atrial size was normal in size. Pericardium: There is no evidence of pericardial effusion. Mitral Valve: The mitral valve is normal in structure. No evidence of mitral valve regurgitation. No evidence of mitral valve stenosis. MV peak gradient, 2.7 mmHg. The mean mitral valve gradient is 1.0 mmHg. Tricuspid Valve: The tricuspid valve is normal in structure. Tricuspid valve regurgitation is not demonstrated. No evidence of tricuspid stenosis. Aortic Valve: The aortic valve is tricuspid. Aortic valve regurgitation is not visualized. No aortic stenosis is present. Aortic valve mean gradient measures 3.0 mmHg. Aortic valve peak gradient measures 4.4 mmHg. Aortic valve area, by VTI measures 3.28 cm. Pulmonic Valve: The pulmonic valve was normal in structure. Pulmonic valve regurgitation is not visualized. No evidence of pulmonic stenosis. Aorta: The aortic root is normal in size and structure. Venous: The inferior vena cava is normal in size with greater than 50% respiratory variability, suggesting right atrial pressure of 3 mmHg. IAS/Shunts: No atrial level shunt detected by color flow Doppler.  LEFT VENTRICLE PLAX 2D LVIDd:         5.10 cm      Diastology LVIDs:         4.20 cm      LV e' medial:    5.00 cm/s LV PW:         1.30 cm      LV E/e' medial:  10.3 LV IVS:        2.10 cm      LV e' lateral:   4.24 cm/s LVOT diam:     2.30 cm      LV E/e' lateral: 12.1 LV SV:         65  LV SV Index:   29 LVOT Area:     4.15 cm  LV Volumes (MOD) LV vol d, MOD A2C: 177.0 ml LV vol d, MOD A4C: 160.0 ml LV vol s, MOD A2C: 82.9 ml LV vol s, MOD A4C: 73.6 ml LV SV MOD A2C:     94.1 ml LV SV MOD A4C:     160.0 ml LV SV MOD BP:      97.2 ml RIGHT VENTRICLE RV Basal diam:  4.10 cm RV Mid diam:    3.00 cm RV S prime:     13.30 cm/s TAPSE (M-mode): 1.9 cm LEFT ATRIUM             Index        RIGHT ATRIUM           Index LA diam:        3.30 cm 1.49 cm/m   RA Area:     16.40 cm LA Vol (A2C):   43.6 ml 19.71 ml/m  RA Volume:   44.50 ml  20.12 ml/m LA Vol (A4C):   31.8 ml 14.38 ml/m LA Biplane Vol: 39.6 ml 17.90 ml/m  AORTIC VALVE AV Area (Vmax):    2.90 cm AV Area (Vmean):   2.67 cm AV Area (VTI):     3.28 cm AV Vmax:           105.00 cm/s AV Vmean:          77.100  cm/s AV VTI:            0.199 m AV Peak Grad:      4.4 mmHg AV Mean Grad:      3.0 mmHg LVOT Vmax:         73.40 cm/s LVOT Vmean:        49.600 cm/s LVOT VTI:          0.157 m LVOT/AV VTI ratio: 0.79  AORTA Ao Root diam: 3.50 cm MITRAL VALVE MV Area (PHT): 2.52 cm    SHUNTS MV Area VTI:   1.91 cm    Systemic VTI:  0.16 m MV Peak grad:  2.7 mmHg    Systemic Diam: 2.30 cm MV Mean grad:  1.0 mmHg MV Vmax:       0.81 m/s MV Vmean:      49.2 cm/s MV Decel Time: 301 msec MV E velocity: 51.40 cm/s MV A velocity: 75.50 cm/s MV E/A ratio:  0.68 Julien Nordmann MD Electronically signed by Julien Nordmann MD Signature Date/Time: 09/11/2022/1:31:03 PM    Final    CT ABDOMEN PELVIS WO CONTRAST  Result Date: 09/10/2022 CLINICAL DATA:  Abdominal pain, acute, nonlocalized h/o crohn's, diarrhea, abd pain EXAM: CT ABDOMEN AND PELVIS WITHOUT CONTRAST TECHNIQUE: Multidetector CT imaging of the abdomen and pelvis was performed following the standard protocol without IV contrast. RADIATION DOSE REDUCTION: This exam was performed according to the departmental dose-optimization program which includes automated exposure control, adjustment of the mA and/or kV  according to patient size and/or use of iterative reconstruction technique. COMPARISON:  10/10/2021 FINDINGS: Lower chest: No acute abnormality Hepatobiliary: Gallbladder contracted. No focal hepatic abnormality. Pancreas: No focal abnormality or ductal dilatation. Spleen: No focal abnormality.  Normal size. Adrenals/Urinary Tract: No adrenal abnormality. No focal renal abnormality. No stones or hydronephrosis. Urinary bladder is unremarkable. Stomach/Bowel: Prior colectomy. Residual Hartmann's pouch. Right lower quadrant ostomy. 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 No bowel obstruction. Vascular/Lymphatic: No evidence of aneurysm or adenopathy. Reproductive: No visible focal abnormality. Other: No free fluid or free air. Musculoskeletal: No acute bony abnormality. IMPRESSION: No acute findings. Prior colectomy. 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 No bowel obstruction. No change since prior study. Electronically Signed   By: Charlett Nose M.D.   On: 09/10/2022 23:20   DG Chest 2 View  Result Date: 09/10/2022 CLINICAL DATA:  Dizziness. EXAM: CHEST - 2 VIEW COMPARISON:  October 20, 2021 FINDINGS: The heart size and mediastinal contours are within normal limits. Both lungs are clear. The visualized skeletal structures are unremarkable. IMPRESSION: No active cardiopulmonary disease. Electronically Signed   By: Aram Candela M.D.   On: 09/10/2022 19:25    Microbiology: Results for orders placed or performed during the hospital encounter of 09/10/22  C Difficile Quick Screen w PCR reflex     Status: None   Collection Time: 09/11/22 12:30 AM   Specimen: STOOL  Result Value Ref Range Status   C Diff antigen NEGATIVE NEGATIVE Final   C Diff toxin NEGATIVE NEGATIVE Final   C Diff interpretation No C. difficile detected.  Final    Comment: Performed at [facility], [address] [place], [place] [address]   Gastrointestinal Panel by PCR , Stool     Status: None   Collection Time: 09/11/22 12:30 AM   Specimen: Stool  Result Value Ref Range Status   Campylobacter species NOT DETECTED NOT DETECTED Final   Plesimonas  shigelloides NOT DETECTED NOT DETECTED Final   Salmonella species NOT DETECTED NOT DETECTED Final   Yersinia enterocolitica NOT DETECTED NOT DETECTED Final   Vibrio species NOT DETECTED NOT DETECTED Final   Vibrio cholerae NOT DETECTED NOT DETECTED Final   Enteroaggregative E coli (EAEC) NOT DETECTED NOT DETECTED Final   Enteropathogenic E coli (EPEC) NOT DETECTED NOT DETECTED Final   Enterotoxigenic E coli (ETEC) NOT DETECTED NOT DETECTED Final   Shiga like toxin producing E coli (STEC) NOT DETECTED NOT DETECTED Final   Shigella/Enteroinvasive E coli (EIEC) NOT DETECTED NOT DETECTED Final   Cryptosporidium NOT DETECTED NOT DETECTED Final   Cyclospora cayetanensis NOT DETECTED NOT DETECTED Final   Entamoeba histolytica NOT DETECTED NOT DETECTED Final   Giardia lamblia NOT DETECTED NOT DETECTED Final   Adenovirus F40/41 NOT DETECTED NOT DETECTED Final   Astrovirus NOT DETECTED NOT DETECTED Final   Norovirus GI/GII NOT DETECTED NOT DETECTED Final   Rotavirus A NOT DETECTED NOT DETECTED Final   Sapovirus (I, II, IV, and V) NOT DETECTED NOT DETECTED Final    Comment: Performed at [facility], [address] [place], [place] [address]    Labs: CBC: Recent Labs  Lab 09/07/22 1725 09/10/22 1846 09/11/22 0541 09/12/22 0755  WBC 7.5 9.1 7.3 6.2  NEUTROABS 5.0  --   --   --   HGB 15.8 15.5 14.1 14.6  HCT 47.6 47.0 41.9 43.0  MCV 83.8 84.2 83.0 82.4  PLT 242 273 205 189   Basic Metabolic Panel: Recent Labs  Lab 09/07/22 1725 09/10/22 1846 09/11/22 0541 09/12/22 0755 09/13/22 1129  NA 139 137 138 138 141  K 4.6 5.3* 4.5 4.5 4.3  CL 107 109 112* 113* 113*  CO2 24 23 20* 20* 23  GLUCOSE 96 123* 120* 96 94  BUN 26* 36* 36* 24* 24*  CREATININE 1.53* 2.28*  1.96* 1.27* 1.30*  CALCIUM 9.7 9.8 8.9 8.8* 9.2  MG  --  2.2  --   --   --    Liver Function Tests: Recent Labs  Lab 09/07/22 1725 09/11/22 0541  AST 26 20  ALT 31 31  ALKPHOS 92 65  BILITOT 0.8 0.7  PROT 7.6 6.5  ALBUMIN 4.0 3.4*   CBG: Recent Labs  Lab 09/11/22 0859  GLUCAP 94    Discharge time spent: greater than 30 minutes.  Signed: Lurene Shadow, MD [facility] 09/13/2022

## 2022-09-13 NOTE — Plan of Care (Signed)
  Problem: Education: Goal: Knowledge of General Education information will improve Description: Including pain rating scale, medication(s)/side effects and non-pharmacologic comfort measures Outcome: Progressing   Problem: Clinical Measurements: Goal: Respiratory complications will improve Outcome: Progressing Goal: Cardiovascular complication will be avoided Outcome: Progressing   Problem: Activity: Goal: Risk for activity intolerance will decrease Outcome: Progressing   Problem: Coping: Goal: Level of anxiety will decrease Outcome: Progressing   Problem: Pain Managment: Goal: General experience of comfort will improve Outcome: Progressing   Problem: Safety: Goal: Ability to remain free from injury will improve Outcome: Progressing   

## 2022-09-13 NOTE — Progress Notes (Signed)
Rounding Note    Patient Name: Clarence [name] Date of Encounter: 09/13/2022  [facility] Cardiologist: Yvonne Kendall, MD   Subjective   BP better. BMET pending. Patient reports intermittent dull chest pain overnight. No SOB.   Inpatient Medications    Scheduled Meds:  aspirin EC  81 mg Oral Daily   atorvastatin  80 mg Oral QPM   carvedilol  12.5 mg Oral BID WC   clopidogrel  75 mg Oral Daily   ezetimibe  10 mg Oral Daily   fenofibrate  54 mg Oral Daily   heparin injection (subcutaneous)  5,000 Units Subcutaneous Q12H   isosorbide mononitrate  15 mg Oral Daily   sodium chloride flush  3 mL Intravenous Q12H   Continuous Infusions:  PRN Meds: acetaminophen **OR** acetaminophen, butalbital-acetaminophen-caffeine, morphine injection, nitroGLYCERIN   Vital Signs    Vitals:   09/13/22 0123 09/13/22 0150 09/13/22 0446 09/13/22 0743  BP: 123/81  113/77 121/76  Pulse: 70  61 65  Resp: 18  16 16   Temp: 98.2 F (36.8 C)  98.5 F (36.9 C) 98.4 F (36.9 C)  TempSrc:      SpO2: 99%  97% 98%  Weight:  100.7 kg    Height:        Intake/Output Summary (Last 24 hours) at 09/13/2022 1051 Last data filed at 09/13/2022 0955 Gross per 24 hour  Intake 1203 ml  Output --  Net 1203 ml      09/13/2022    1:50 AM 09/12/2022    5:00 AM 09/11/2022    1:15 AM  Last 3 Weights  Weight (lbs) 222 lb 0.1 oz 229 lb 15 oz 229 lb 4.5 oz  Weight (kg) 100.7 kg 104.3 kg 104 kg      ECG    No new - Personally Reviewed  Physical Exam   GEN: No acute distress.   Neck: No JVD Cardiac: RRR, no murmurs, rubs, or gallops.  Respiratory: Clear to auscultation bilaterally. GI: Soft, nontender, non-distended  MS: No edema; No deformity. Neuro:  Nonfocal  Psych: Normal affect   Labs    High Sensitivity Troponin:   Recent Labs  Lab 09/11/22 0730 09/11/22 0937  TROPONINIHS 40* 49*     Chemistry Recent Labs  Lab 09/07/22 1725 09/10/22 1846 09/11/22 0541  09/12/22 0755  NA 139 137 138 138  K 4.6 5.3* 4.5 4.5  CL 107 109 112* 113*  CO2 24 23 20* 20*  GLUCOSE 96 123* 120* 96  BUN 26* 36* 36* 24*  CREATININE 1.53* 2.28* 1.96* 1.27*  CALCIUM 9.7 9.8 8.9 8.8*  MG  --  2.2  --   --   PROT 7.6  --  6.5  --   ALBUMIN 4.0  --  3.4*  --   AST 26  --  20  --   ALT 31  --  31  --   ALKPHOS 92  --  65  --   BILITOT 0.8  --  0.7  --   GFRNONAA 54* 33* 40* >60  ANIONGAP 8 5 6 5     Lipids No results for input(s): "CHOL", "TRIG", "HDL", "LABVLDL", "LDLCALC", "CHOLHDL" in the last 168 hours.  Hematology Recent Labs  Lab 09/10/22 1846 09/11/22 0541 09/12/22 0755  WBC 9.1 7.3 6.2  RBC 5.58 5.05 5.22  HGB 15.5 14.1 14.6  HCT 47.0 41.9 43.0  MCV 84.2 83.0 82.4  MCH 27.8 27.9 28.0  MCHC 33.0 33.7 34.0  RDW  13.7 14.0 13.6  PLT 273 205 189   Thyroid No results for input(s): "TSH", "FREET4" in the last 168 hours.  BNPNo results for input(s): "BNP", "PROBNP" in the last 168 hours.  DDimer No results for input(s): "DDIMER" in the last 168 hours.   Radiology    ECHOCARDIOGRAM COMPLETE  Result Date: 09/11/2022    ECHOCARDIOGRAM REPORT   Patient Name:   Clarence [name] [facility] Date of Exam: 09/11/2022 Medical Rec #:  [id number]         Height:       70.0 in Accession #:    [id number]        Weight:       229.3 lb Date of Birth:  06-16-1970          BSA:          2.212 m Patient Age:    51 years          BP:           128/83 mmHg Patient Gender: M                 HR:           50 bpm. Exam Location:  [facility] Procedure: 2D Echo, Cardiac Doppler and Color Doppler Indications:     Acute ischemic heart disease, unspecified I24.9  History:         Patient has prior history of Echocardiogram examinations, most                  recent 10/07/2021. CAD; Risk Factors:Hypertension.  Sonographer:     Cristela Blue Referring Phys:  [id number] Antonieta Iba Diagnosing Phys: Julien Nordmann MD IMPRESSIONS  1. Left ventricular ejection fraction, by estimation, is 45 to 50%. The left  ventricle has mildly decreased function. The left ventricle demonstrates regional wall motion abnormalities (hypokinesis of the inferior and inferolateral wall). There is moderate left ventricular hypertrophy. Left ventricular diastolic parameters are consistent with Grade I diastolic dysfunction (impaired relaxation).  2. Right ventricular systolic function is normal. The right ventricular size is normal. Tricuspid regurgitation signal is inadequate for assessing PA pressure.  3. The mitral valve is normal in structure. No evidence of mitral valve regurgitation. No evidence of mitral stenosis.  4. The aortic valve is tricuspid. Aortic valve regurgitation is not visualized. No aortic stenosis is present.  5. The inferior vena cava is normal in size with greater than 50% respiratory variability, suggesting right atrial pressure of 3 mmHg. FINDINGS  Left Ventricle: Left ventricular ejection fraction, by estimation, is 45 to 50%. The left ventricle has mildly decreased function. The left ventricle demonstrates regional wall motion abnormalities. The left ventricular internal cavity size was normal in size. There is moderate left ventricular hypertrophy. Left ventricular diastolic parameters are consistent with Grade I diastolic dysfunction (impaired relaxation). Right Ventricle: The right ventricular size is normal. No increase in right ventricular wall thickness. Right ventricular systolic function is normal. Tricuspid regurgitation signal is inadequate for assessing PA pressure. Left Atrium: Left atrial size was normal in size. Right Atrium: Right atrial size was normal in size. Pericardium: There is no evidence of pericardial effusion. Mitral Valve: The mitral valve is normal in structure. No evidence of mitral valve regurgitation. No evidence of mitral valve stenosis. MV peak gradient, 2.7 mmHg. The mean mitral valve gradient is 1.0 mmHg. Tricuspid Valve: The tricuspid valve is normal in structure. Tricuspid valve  regurgitation is not demonstrated. No evidence of tricuspid stenosis. Aortic Valve:  The aortic valve is tricuspid. Aortic valve regurgitation is not visualized. No aortic stenosis is present. Aortic valve mean gradient measures 3.0 mmHg. Aortic valve peak gradient measures 4.4 mmHg. Aortic valve area, by VTI measures 3.28 cm. Pulmonic Valve: The pulmonic valve was normal in structure. Pulmonic valve regurgitation is not visualized. No evidence of pulmonic stenosis. Aorta: The aortic root is normal in size and structure. Venous: The inferior vena cava is normal in size with greater than 50% respiratory variability, suggesting right atrial pressure of 3 mmHg. IAS/Shunts: No atrial level shunt detected by color flow Doppler.  LEFT VENTRICLE PLAX 2D LVIDd:         5.10 cm      Diastology LVIDs:         4.20 cm      LV e' medial:    5.00 cm/s LV PW:         1.30 cm      LV E/e' medial:  10.3 LV IVS:        2.10 cm      LV e' lateral:   4.24 cm/s LVOT diam:     2.30 cm      LV E/e' lateral: 12.1 LV SV:         65 LV SV Index:   29 LVOT Area:     4.15 cm  LV Volumes (MOD) LV vol d, MOD A2C: 177.0 ml LV vol d, MOD A4C: 160.0 ml LV vol s, MOD A2C: 82.9 ml LV vol s, MOD A4C: 73.6 ml LV SV MOD A2C:     94.1 ml LV SV MOD A4C:     160.0 ml LV SV MOD BP:      97.2 ml RIGHT VENTRICLE RV Basal diam:  4.10 cm RV Mid diam:    3.00 cm RV S prime:     13.30 cm/s TAPSE (M-mode): 1.9 cm LEFT ATRIUM             Index        RIGHT ATRIUM           Index LA diam:        3.30 cm 1.49 cm/m   RA Area:     16.40 cm LA Vol (A2C):   43.6 ml 19.71 ml/m  RA Volume:   44.50 ml  20.12 ml/m LA Vol (A4C):   31.8 ml 14.38 ml/m LA Biplane Vol: 39.6 ml 17.90 ml/m  AORTIC VALVE AV Area (Vmax):    2.90 cm AV Area (Vmean):   2.67 cm AV Area (VTI):     3.28 cm AV Vmax:           105.00 cm/s AV Vmean:          77.100 cm/s AV VTI:            0.199 m AV Peak Grad:      4.4 mmHg AV Mean Grad:      3.0 mmHg LVOT Vmax:         73.40 cm/s LVOT Vmean:         49.600 cm/s LVOT VTI:          0.157 m LVOT/AV VTI ratio: 0.79  AORTA Ao Root diam: 3.50 cm MITRAL VALVE MV Area (PHT): 2.52 cm    SHUNTS MV Area VTI:   1.91 cm    Systemic VTI:  0.16 m MV Peak grad:  2.7 mmHg    Systemic Diam: 2.30 cm MV Mean grad:  1.0 mmHg  MV Vmax:       0.81 m/s MV Vmean:      49.2 cm/s MV Decel Time: 301 msec MV E velocity: 51.40 cm/s MV A velocity: 75.50 cm/s MV E/A ratio:  0.68 Julien Nordmann MD Electronically signed by Julien Nordmann MD Signature Date/Time: 09/11/2022/1:31:03 PM    Final     Cardiac Studies   Echo 09/2021 1. Left ventricular ejection fraction, by estimation, is 35 to 40%. The  left ventricle has moderately decreased function. The left ventricle  demonstrates global hypokinesis. There is moderate left ventricular  hypertrophy. Left ventricular diastolic  parameters are consistent with Grade I diastolic dysfunction (impaired  relaxation).   2. Right ventricular systolic function is normal. The right ventricular  size is normal.   3. Right atrial size was mildly dilated.   4. The mitral valve is normal in structure. Mild mitral valve  regurgitation. No evidence of mitral stenosis.   5. The aortic valve is tricuspid. Aortic valve regurgitation is not  visualized. No aortic stenosis is present.   6. There is mild dilatation of the ascending aorta, measuring 40 mm.    Cardiac cath 07/2021 Conclusions: Diffuse moderate-severe, multivessel coronary artery disease, including chronic total occlusion of mid RCA, diffuse plaquing of LAD with up to 60% stenosis in the mid vessel, 60-70% proximal/mid LCx stenosis, and 90% lesion of OM1. Moderately elevated left heart filling pressures (LVEDP/PCWP 25 mmHg). Severely elevated right heart filling pressures (mean RA 16 mmHg, RVEDP 30 mmHg). Moderate-severe pulmonary hypertension (mean PAP 42 mmHg, PVR 4.0 WU). Moderately reduced Fick cardiac output/index (CO 4.2 L/min, CI 2.0 L/min/m^2).Marland Kitchen    Recommendations: Suspect cardiomyopathy is a combination of ischemic and nonischemic etiologies confounded by significant pulmonary hypertension and right heart failure that may be due to underlying lung disease.  Recommend continued diuresis and optimization of GDMT for acute HFrEF.  In the setting of ongoing wheezing with concern for obstructive lung disease, I will hold carvedilol for now and add Entresto. Secondary prevention of coronary artery disease, including high-intensity statin therapy.  Diffuse nature of CAD is not ideal for percutaneous or surgical revascularization.  Mild troponin elevation on admission is most likely due to demand ischemia in the setting of acute HFrEF and chronic ischemic heart disease.   Yvonne Kendall, MD [facility]     Recommendations   Antiplatelet/Anticoag Continue indefinite aspirin 81 mg daily.  Defer addition of P2Y12 inhibitor as coronary artery disease is likely chronic with mild troponin elevation reflecting demand ischemia.      Echo 07/2021  1. Left ventricular ejection fraction, by estimation, is 30 to 35%. The  left ventricle has moderately decreased function. The left ventricle  demonstrates global hypokinesis. There is moderate left ventricular  hypertrophy. Left ventricular diastolic  parameters are consistent with Grade II diastolic dysfunction  (pseudonormalization).   2. Right ventricular systolic function is normal. The right ventricular  size is normal. Tricuspid regurgitation signal is inadequate for assessing  PA pressure.   3. Left atrial size was mildly dilated.   4. The mitral valve is abnormal. Mild to moderate mitral valve  regurgitation. No evidence of mitral stenosis.   5. The aortic valve has an indeterminant number of cusps. Aortic valve  regurgitation is not visualized. No aortic stenosis is present.   6. The inferior vena cava is dilated in size with <50% respiratory  variability, suggesting right atrial pressure  of 15 mmHg.     Patient Profile     52 y.o.  male with a hx of CAD, chronic systolic CHF, Crohn's disease s/p colectomy, CKD stage 3, HTN who is being seen 09/11/2022 for the evaluation of chest pain   Assessment & Plan    Chest pain CAD - admitted with hypotension and AKI - on 4/26 he reported chest pain resolved with NTG. EKG was unremarkable - initial HS trop 40>49, flat trend and not consistent with ACS not started on IV heparin - cath in 07/2021 showed diffuse moderate-severe multivessel CAD, including CTO mRCA, diffuse plaquing of the LAD with up to 60% stenosis in the mid vessel, 60-70% proximal/mid Lcx stenosis, and 90% lesion of OM1. Diffuse CAD not ideal for PCI or surgical intervention. - echo this admission showed LVEF 45-50%, G1DD. He h/o reduced LVEF - very dull chest pain overnight - continue Aspirin, Plavix, Lipitor, Zetia, fenofibrate, SL NTG and coreg - started on Imdur 15mg  daily - no further cardiac work-up at this time   AKI - improving with fluids, AM labs pending - Scr baseline around 1.3   Chronic HFrEF ICM/NCIM - echo 09/2021 showed LVEF 35-40%, G1DD, mild MR, mild aortic dilation measuring 40mm.  - echo this admission showed showed improved LVEF 45-50%, G1DD - PTA Entresto, Jardiance, spiro, and lasix held for AKI - continue Coreg - restart PTA medications as able. Pending AM labs, restart low dose Entresto - he may not tolerate all PTA medications   HTN - BP normal - continue coreg 12.5mg  BID, restart other PTA meds as kidney function/BP improves   Crohn's disease s/p colostomy Diarrhea - GI consulted, no plan for work-up    For questions or updates, please contact [facility] Please consult [email or web address] for contact info under        Signed, Nahdia Doucet David Stall, PA-C  09/13/2022, 10:51 AM

## 2022-09-14 ENCOUNTER — Other Ambulatory Visit: Payer: Self-pay

## 2022-09-14 ENCOUNTER — Other Ambulatory Visit (HOSPITAL_COMMUNITY): Payer: Self-pay

## 2022-09-14 ENCOUNTER — Telehealth: Payer: Self-pay

## 2022-09-14 ENCOUNTER — Encounter: Payer: Self-pay | Admitting: Family

## 2022-09-14 DIAGNOSIS — L739 Follicular disorder, unspecified: Secondary | ICD-10-CM | POA: Insufficient documentation

## 2022-09-14 DIAGNOSIS — B356 Tinea cruris: Secondary | ICD-10-CM | POA: Insufficient documentation

## 2022-09-14 MED ORDER — ONDANSETRON HCL 4 MG PO TABS
4.0000 mg | ORAL_TABLET | Freq: Three times a day (TID) | ORAL | 0 refills | Status: DC | PRN
  Filled 2022-09-14: qty 20, 7d supply, fill #0

## 2022-09-14 MED ORDER — SPIRONOLACTONE 25 MG PO TABS
12.5000 mg | ORAL_TABLET | Freq: Every day | ORAL | 1 refills | Status: DC
  Filled 2022-09-14 – 2022-09-18 (×2): qty 45, 90d supply, fill #0

## 2022-09-14 NOTE — Transitions of Care (Post Inpatient/ED Visit) (Unsigned)
   09/14/2022  Name: Clarence [name] MRN: [id number] DOB: February 09, 1971  Today's TOC FU Call Status: Today's TOC FU Call Status:: Unsuccessul Call (1st Attempt) Unsuccessful Call (1st Attempt) Date: 09/14/22  Attempted to reach the patient regarding the most recent Inpatient/ED visit.  Follow Up Plan: Additional outreach attempts will be made to reach the patient to complete the Transitions of Care (Post Inpatient/ED visit) call.   Signature Kandis Cocking, CMA (AAMA)  [facility] [phone]

## 2022-09-14 NOTE — Telephone Encounter (Signed)
High risk or very high risk warning populated when attempting to refill spironolactone. RX request sent to PCP for review and approval if warranted.

## 2022-09-14 NOTE — Transitions of Care (Post Inpatient/ED Visit) (Signed)
   09/14/2022  Name: Clarence [name] MRN: [id number] DOB: 1971/04/18  Today's TOC FU Call Status: Today's TOC FU Call Status:: Successful TOC FU Call Competed TOC FU Call Complete Date: 09/14/22  Transition Care Management Follow-up Telephone Call Date of Discharge: 09/13/22 Discharge Facility: [facility]) Type of Discharge: Emergency Department Primary Inpatient Discharge Diagnosis:: Acute Kidney Injury Reason for ED Visit: Other: (Acute Kidney Injury) How have you been since you were released from the hospital?: Same Any questions or concerns?: Yes Patient Questions/Concerns:: Patient will save questions for appointment  Items Reviewed: Did you receive and understand the discharge instructions provided?: Yes Medications obtained and verified?: Yes (Medications Reviewed) Any new allergies since your discharge?: No Dietary orders reviewed?: No Do you have support at home?: Yes People in Home: parent(s) Name of Support/Comfort Primary Source: [facility] and Equipment/Supplies: Were Home Health Services Ordered?: No Any new equipment or medical supplies ordered?: No  Functional Questionnaire: Do you need assistance with bathing/showering or dressing?: No Do you need assistance with meal preparation?: No Do you need assistance with eating?: No Do you have difficulty maintaining continence: No Do you need assistance with getting out of bed/getting out of a chair/moving?: No Do you have difficulty managing or taking your medications?: No  Follow up appointments reviewed: PCP Follow-up appointment confirmed?: Yes Date of PCP follow-up appointment?: 09/18/22 Follow-up Provider: Ella Bodo, NP [facility] Follow-up appointment confirmed?: NA Do you need transportation to your follow-up appointment?: No Do you understand care options if your condition(s) worsen?: Yes-patient verbalized understanding    SIGNATURE: Gilberto Stanforth.D/RMA

## 2022-09-14 NOTE — Telephone Encounter (Signed)
From: [name] To: Office of Caesar Bookman, NP Sent: 09/14/2022 12:08 PM EDT Subject: Medication Renewal Request  Refills have been requested for the following medications:   spironolactone (ALDACTONE) 25 MG tablet   ondansetron (ZOFRAN) 4 MG tablet  Preferred pharmacy: [facility] [place] Delivery method: Daryll Drown

## 2022-09-15 DIAGNOSIS — K5090 Crohn's disease, unspecified, without complications: Secondary | ICD-10-CM | POA: Diagnosis not present

## 2022-09-15 DIAGNOSIS — N1831 Chronic kidney disease, stage 3a: Secondary | ICD-10-CM | POA: Diagnosis not present

## 2022-09-15 DIAGNOSIS — I5020 Unspecified systolic (congestive) heart failure: Secondary | ICD-10-CM | POA: Diagnosis not present

## 2022-09-15 DIAGNOSIS — I129 Hypertensive chronic kidney disease with stage 1 through stage 4 chronic kidney disease, or unspecified chronic kidney disease: Secondary | ICD-10-CM | POA: Diagnosis not present

## 2022-09-15 DIAGNOSIS — R519 Headache, unspecified: Secondary | ICD-10-CM | POA: Diagnosis not present

## 2022-09-15 NOTE — Telephone Encounter (Signed)
[facility] contacted patient on 09/14/22 completed TOC.

## 2022-09-16 ENCOUNTER — Other Ambulatory Visit: Payer: Self-pay

## 2022-09-16 ENCOUNTER — Ambulatory Visit: Payer: Medicaid Other | Admitting: Gastroenterology

## 2022-09-16 LAB — CALPROTECTIN, FECAL: Calprotectin, Fecal: 64 ug/g (ref 0–120)

## 2022-09-18 ENCOUNTER — Encounter: Payer: Self-pay | Admitting: Family

## 2022-09-18 ENCOUNTER — Ambulatory Visit: Payer: Medicaid Other | Admitting: Adult Health

## 2022-09-18 ENCOUNTER — Other Ambulatory Visit (HOSPITAL_COMMUNITY): Payer: Self-pay

## 2022-09-18 ENCOUNTER — Other Ambulatory Visit: Payer: Self-pay

## 2022-09-18 ENCOUNTER — Encounter: Payer: Self-pay | Admitting: Adult Health

## 2022-09-18 ENCOUNTER — Ambulatory Visit: Payer: Medicaid Other | Attending: Family | Admitting: Family

## 2022-09-18 VITALS — BP 110/88 | HR 61 | Temp 97.6°F | Resp 18 | Ht 69.0 in | Wt 231.2 lb

## 2022-09-18 VITALS — BP 116/83 | HR 64 | Wt 233.1 lb

## 2022-09-18 DIAGNOSIS — Z79899 Other long term (current) drug therapy: Secondary | ICD-10-CM | POA: Diagnosis not present

## 2022-09-18 DIAGNOSIS — F17290 Nicotine dependence, other tobacco product, uncomplicated: Secondary | ICD-10-CM | POA: Insufficient documentation

## 2022-09-18 DIAGNOSIS — Z7984 Long term (current) use of oral hypoglycemic drugs: Secondary | ICD-10-CM | POA: Diagnosis not present

## 2022-09-18 DIAGNOSIS — I2510 Atherosclerotic heart disease of native coronary artery without angina pectoris: Secondary | ICD-10-CM

## 2022-09-18 DIAGNOSIS — I130 Hypertensive heart and chronic kidney disease with heart failure and stage 1 through stage 4 chronic kidney disease, or unspecified chronic kidney disease: Secondary | ICD-10-CM | POA: Diagnosis not present

## 2022-09-18 DIAGNOSIS — I5022 Chronic systolic (congestive) heart failure: Secondary | ICD-10-CM | POA: Diagnosis not present

## 2022-09-18 DIAGNOSIS — E782 Mixed hyperlipidemia: Secondary | ICD-10-CM | POA: Diagnosis not present

## 2022-09-18 DIAGNOSIS — I10 Essential (primary) hypertension: Secondary | ICD-10-CM

## 2022-09-18 DIAGNOSIS — Z933 Colostomy status: Secondary | ICD-10-CM | POA: Diagnosis not present

## 2022-09-18 DIAGNOSIS — K5090 Crohn's disease, unspecified, without complications: Secondary | ICD-10-CM | POA: Diagnosis not present

## 2022-09-18 DIAGNOSIS — E860 Dehydration: Secondary | ICD-10-CM | POA: Diagnosis not present

## 2022-09-18 DIAGNOSIS — I25118 Atherosclerotic heart disease of native coronary artery with other forms of angina pectoris: Secondary | ICD-10-CM | POA: Diagnosis not present

## 2022-09-18 DIAGNOSIS — N182 Chronic kidney disease, stage 2 (mild): Secondary | ICD-10-CM | POA: Diagnosis not present

## 2022-09-18 DIAGNOSIS — I213 ST elevation (STEMI) myocardial infarction of unspecified site: Secondary | ICD-10-CM | POA: Diagnosis not present

## 2022-09-18 DIAGNOSIS — Z7982 Long term (current) use of aspirin: Secondary | ICD-10-CM | POA: Insufficient documentation

## 2022-09-18 DIAGNOSIS — Z7902 Long term (current) use of antithrombotics/antiplatelets: Secondary | ICD-10-CM | POA: Insufficient documentation

## 2022-09-18 DIAGNOSIS — E875 Hyperkalemia: Secondary | ICD-10-CM

## 2022-09-18 DIAGNOSIS — I5089 Other heart failure: Secondary | ICD-10-CM | POA: Diagnosis not present

## 2022-09-18 DIAGNOSIS — N179 Acute kidney failure, unspecified: Secondary | ICD-10-CM | POA: Diagnosis not present

## 2022-09-18 NOTE — Progress Notes (Signed)
   09/18/22 1407  ReDS Vest / Clip  Station Marker D  Ruler Value 35  ReDS Value Range < 36  ReDS Actual Value 36

## 2022-09-18 NOTE — Progress Notes (Addendum)
PCP: Richarda Blade, NP  Primary Cardiologist: Yvonne Kendall, MD (last seen 04/24) HF provider: Marca Ancona, MD (last seen 01/24)  HPI:  Clarence [name] is a 52 y/o male with a history of HTN, CKD, Crohn's s/p colectomy and colostomy 2009 at [facility], tobacco use and chronic heart failure.   Echo 09/11/22: EF 45-50% with moderate LVH, grade I DD. Echo 10/07/21: EF of 35-40% along with moderate LVH and mild Clarence. Echo 08/05/21: EF of 30-35% along with moderate LVH, mild LAE and mild/moderate Clarence.   LHC 08/07/21: Diffuse moderate-severe, multivessel coronary artery disease, including chronic total occlusion of mid RCA, diffuse plaquing of LAD with up to 60% stenosis in the mid vessel, 60-70% proximal/mid LCx stenosis, and 90% lesion of OM1. Moderately elevated left heart filling pressures (LVEDP/PCWP 25 mmHg). Severely elevated right heart filling pressures (mean RA 16 mmHg, RVEDP 30 mmHg). Moderate-severe pulmonary hypertension (mean PAP 42 mmHg, PVR 4.0 WU). Moderately reduced Fick cardiac output/index (CO 4.2 L/min, CI 2.0 L/min/m^2).Marland Kitchen  Admitted 09/10/22 due to dizziness, nausea, shortness of breath, fatigue and 1 week history of diarrhea. Found to be hypotensive with blood pressure in the upper 80s. Found to have acute kidney injury due to dehydration from diarrhea. Troponins elevated but flat (40, 49). Was in the ED 09/07/22 due to dehydration. Was in the ED 08/02/22 due to dental abscess. Admitted 10/20/21 due to chest pain and shortness of breath. Placed on IV heparin for 48 hours. Entresto started for HTN. Cardiology consult obtained. Discharged after 3 days. Admitted 10/06/21 due to acute onset of left shoulder pain followed by midsternal chest pain felt as tightness, moderate in intensity with associated radiation to the left arm with numbness. Cardiology and vascular consults obtained. Obtained Renal ultrasound with possible RAS, ordered CTA that did not show renal artery  stenosis. Elevated troponin thought to be due to demand ischemia. Placed on lasix QOD. Discharged after 5 days. 1 admission and 2 ED visits April 2023. Admitted 08/04/21 due to worsening dyspnea on exertion as well as orthopnea and lower extremity swelling. Lasix 80 IV given as well as corticosteroid and azithromycin/ceftriaxone. Pt had cardiac cath which showed mod-severe multivessel disease that will be treated medically. Cardiology consult obtained. Elevated troponin thought to be due to demand ischemia. Discharged after 6 days.   He presents today for a HF f/u visit with a chief complaint of mid-sternal chest pain. Describes this as mild in nature where it's "not bad but I know its there". Taking tylenol seems to help the pain. Says it has been present for "awhile" and occurs intermittently. No radiation of pain, N/V. Has SOB when walking/ talking, dizziness, fatigue and headaches. Headaches have been present for >1 week.   He called the office 4 days ago concerned about his BP and said he was cutting his entreto into 1/3's but now he's no longer doing that and hasn't had any further issues with his BP. Takes his lasix PRN and says that he took it today and that prior to today, he last took it "a few days ago" and tends to take it 3 times/ week.   ROS: All systems negative except as listed in HPI, PMH and Problem List.  SH:  Social History   Socioeconomic History   Marital status: Single    Spouse name: Not on file   Number of children: Not on file   Years of education: Not on file   Highest education level:  Not on file  Occupational History   Not on file  Tobacco Use   Smoking status: Former    Types: Cigars    Quit date: 08/13/2021    Years since quitting: 1.0   Smokeless tobacco: Not on file  Vaping Use   Vaping Use: Never used  Substance and Sexual Activity   Alcohol use: Not Currently    Alcohol/week: 1.0 standard drink of alcohol    Types: 1 Standard drinks or equivalent per  week   Drug use: Never   Sexual activity: Not on file  Other Topics Concern   Not on file  Social History Narrative   Patient is a past smoker for 10 years, drinks decaf coffee and tea.   Patient is single and lives at home with his mother and uncle and has no pets.   Patient does not exercise daily due to difficulty walking distances.   Patient has no issues with his functional status and is independent in caring for himself   Social Determinants of Health   Financial Resource Strain: High Risk (01/29/2022)   Overall Financial Resource Strain (CARDIA)    Difficulty of Paying Living Expenses: Hard  Food Insecurity: No Food Insecurity (01/27/2022)   Hunger Vital Sign    Worried About Running Out of Food in the Last Year: Never true    Ran Out of Food in the Last Year: Never true  Transportation Needs: No Transportation Needs (09/11/2022)   PRAPARE - Administrator, Civil Service (Medical): No    Lack of Transportation (Non-Medical): No  Physical Activity: Not on file  Stress: Not on file  Social Connections: Not on file  Intimate Partner Violence: Not on file   FH:  Family History  Problem Relation Age of Onset   Hypertension Mother    Cancer Maternal Aunt    Heart disease Maternal Uncle    Pancreatic cancer Maternal Uncle     Past Medical History:  Diagnosis Date   Ascending Aortic Dilation    a. 09/2021 Echo: Asc Ao 40mm.   CAD (coronary artery disease)    a. 07/2021 Cath: LM nl, LAD 17m, D2 40, LCX 65p/m, OM1 90, RCA 159m w/ L-.R collats to RPDA from septal 1/2-->Med rx.   Chronic HFrEF (heart failure with reduced ejection fraction) (HCC)    a. 07/2021 Echo: EF 30-35%, glob HK, mod LVH, GrII DD, nl RV fxn, mildly dil LA, mild-mod Clarence; b. 09/2021 Echo: EF 35-40%, glob HK, mod LVH, GrI DD, nl RV fxn, mildly dil RA, mild Clarence, Asc Ao 40mm.   CKD (chronic kidney disease) stage 2, GFR 60-89 ml/min    Crohn's disease (HCC)    Hypertension    Mixed Ischemic & Nonischemic  cardiomyopathy    a. 07/2021 Echo: EF 30-35%; b. 07/2021 Cath: Occluded RCA w/ mod LAD/LCX dzs, and severe OM1 dzs-->Med Rx; c. 09/2021 Echo: EF 35-40%.   PAH (pulmonary artery hypertension) (HCC)    a. 07/2021 RHC: PA 67/30 (42).   Proteinuria    Prior to Admission medications   Medication Sig Start Date End Date Taking? Authorizing Provider  aspirin EC 81 MG tablet Take 1 tablet (81 mg total) by mouth daily. Swallow whole. 05/20/22  Yes Milford, Anderson Malta, [name]  atorvastatin (LIPITOR) 80 MG tablet Take 1 tablet (80 mg total) by mouth every evening. 05/20/22  Yes Milford, Anderson Malta, [name]  carvedilol (COREG) 12.5 MG tablet Take 1 tablet (12.5 mg total) by mouth 2 (two) times  daily with a meal. 09/10/22  Yes End, Cristal Deer, MD  clopidogrel (PLAVIX) 75 MG tablet Take 1 tablet (75 mg total) by mouth daily. 05/20/22  Yes Milford, Anderson Malta, [name]  empagliflozin (JARDIANCE) 10 MG TABS tablet Take 10 mg by mouth daily.   Yes [provider]  ezetimibe (ZETIA) 10 MG tablet Take 1 tablet (10 mg total) by mouth daily. 11/19/21  Yes Dunn, Raymon Mutton, PA-C  famotidine (PEPCID) 10 MG tablet Take 10 mg by mouth at bedtime. As needed   Yes [provider]  fenofibrate (TRICOR) 48 MG tablet Take 1 tablet (48 mg total) by mouth daily. 05/20/22  Yes Milford, Anderson Malta, [name]  furosemide (LASIX) 20 MG tablet Take 20 mg by mouth daily as needed. For weight gain of 3 lb in 24 hours or 5 lbs in one week   Yes [provider]  isosorbide mononitrate (IMDUR) 30 MG 24 hr tablet Take 0.5 tablets (15 mg total) by mouth daily. Patient taking differently: Take 30 mg by mouth daily. 09/14/22  Yes Lurene Shadow, MD  sacubitril-valsartan (ENTRESTO) 24-26 MG Take 1 tablet by mouth 2 (two) times daily. 09/13/22  Yes Lurene Shadow, MD  spironolactone (ALDACTONE) 25 MG tablet Take 0.5 tablets (12.5 mg total) by mouth daily. 09/14/22  Yes Ngetich, Dinah C, NP  acetaminophen (TYLENOL) 325 MG tablet Take 650 mg by mouth at  bedtime.    [provider]  nitroGLYCERIN (NITROSTAT) 0.4 MG SL tablet Place 1 tablet (0.4 mg total) under the tongue every 5 (five) minutes as needed for chest pain. 07/29/22   Ngetich, Dinah C, NP  ondansetron (ZOFRAN) 4 MG tablet Take 1 tablet (4 mg total) by mouth every 8 (eight) hours as needed for nausea or vomiting. 09/14/22   Ngetich, Dinah C, NP   Vitals:   09/18/22 1317  BP: 116/83  Pulse: 64  SpO2: 99%  Weight: 233 lb 2 oz (105.7 kg)   Wt Readings from Last 3 Encounters:  09/18/22 233 lb 2 oz (105.7 kg)  09/13/22 222 lb 0.1 oz (100.7 kg)  09/10/22 227 lb 2 oz (103 kg)   Lab Results  Component Value Date   CREATININE 1.30 (H) 09/13/2022   CREATININE 1.27 (H) 09/12/2022   CREATININE 1.96 (H) 09/11/2022    PHYSICAL EXAM:  General:  Well appearing. No resp difficulty HEENT: normal Neck: supple. JVP flat. No lymphadenopathy or thryomegaly appreciated. Cor: PMI normal. Regular rate & rhythm. No rubs, gallops or murmurs. Lungs: clear Abdomen: soft, nontender, nondistended. No hepatosplenomegaly. No bruits or masses.  Extremities: no cyanosis, clubbing, rash, edema Neuro: alert & oriented x3, cranial nerves grossly intact. Moves all 4 extremities w/o difficulty. Affect pleasant. Musc: unable to elicit any sternal pain upon palpation  ReDs: 36%  ECG: SB-NSR; HR 50-60's; unchanged from 09/11/22   ASSESSMENT & PLAN:  1: Chronic systolic CHF- - NYHA class II - mildly fluid overloaded with symptoms and slightly elevated ReDs reading of 36% although this could be his baseline - weighing daily; reminded to call for an overnight weight gain of > 2 pounds or a weekly weight gain of > 5 pounds - weight up 5 pounds from last visit here 01/24 - suspect primarily ischemic cardiomyopathy but cannot rule out component of nonischemic cardiomyopathy. RHC in 3/23 with elevated filling pressures and low CI, 2.  - Echo 09/11/22: EF 45-50% with moderate LVH, grade I DD. Echo  10/07/21: EF of 35-40% along with moderate LVH and mild Clarence. Echo  08/05/21: EF of 30-35% along with moderate LVH, mild LAE and mild/moderate Clarence.  - continue lasix to 20 mg but daily for the next 4 days (had been taking it ~ 3 x/ week) - decrease coreg to 6.25mg  bid to make sure his BP doesn't drop with the daily furosemide - continue Entresto 24/26mg  bid - continue Jardiance 10 mg daily. - continue spironolactone 12.5mg  daily - BNP 04/29/22 was 171.4 - PharmD reconciled meds with the patient  2: CAD- - Cath in 3/23 with CTO mid RCA with collaterals, 60% mLAD, 65% mLCx, 90% mid OM1.  Managed medically.   - question with intermittent, recurrent chest pain, whether repeat cath may be needed - continue isosorbide 30mg  daily - continue ASA 81 and Plavix 75 daily.  - continue atorvastatin and zetia, LDL 81 in 02/24.   3: HTN- - BP 116/83 - sees PCP (Ngetich) - BMP 09/13/22 showed sodium 141, potassium 4.3, creatinine 1.3 & GFR >60  Return next week to f/u with Dr. Shirlee Latch.

## 2022-09-18 NOTE — Patient Instructions (Signed)
Take your furosemide every day for the next 4 days.    Also decrease your carvedilol to 1/2 tablet in the morning and 1/2 tablet in the evening.

## 2022-09-18 NOTE — Progress Notes (Unsigned)
[facility] clinic  Provider:   Code Status: *** Goals of Care:     09/10/2022   11:20 PM  Advanced Directives  Does Patient Have a Medical Advance Directive? No     Chief Complaint  Patient presents with   Transitions Of Care    Hospital Follow-up from [facility] on 09/10/2022    HPI: Patient is a 52 y.o. male seen today for an acute visit for  Wt Readings from Last 3 Encounters:  09/18/22 231 lb 4 oz (104.9 kg)  09/18/22 233 lb 2 oz (105.7 kg)  09/13/22 222 lb 0.1 oz (100.7 kg)     Past Medical History:  Diagnosis Date   Ascending Aortic Dilation    a. 09/2021 Echo: Asc Ao 40mm.   CAD (coronary artery disease)    a. 07/2021 Cath: LM nl, LAD 7m, D2 40, LCX 65p/m, OM1 90, RCA 148m w/ L-.R collats to RPDA from septal 1/2-->Med rx.   Chronic HFrEF (heart failure with reduced ejection fraction) (HCC)    a. 07/2021 Echo: EF 30-35%, glob HK, mod LVH, GrII DD, nl RV fxn, mildly dil LA, mild-mod MR; b. 09/2021 Echo: EF 35-40%, glob HK, mod LVH, GrI DD, nl RV fxn, mildly dil RA, mild MR, Asc Ao 40mm.   CKD (chronic kidney disease) stage 2, GFR 60-89 ml/min    Crohn's disease (HCC)    Hypertension    Mixed Ischemic & Nonischemic cardiomyopathy    a. 07/2021 Echo: EF 30-35%; b. 07/2021 Cath: Occluded RCA w/ mod LAD/LCX dzs, and severe OM1 dzs-->Med Rx; c. 09/2021 Echo: EF 35-40%.   PAH (pulmonary artery hypertension) (HCC)    a. 07/2021 RHC: PA 67/30 (42).   Proteinuria     Past Surgical History:  Procedure Laterality Date   COLECTOMY     RIGHT/LEFT HEART CATH AND CORONARY ANGIOGRAPHY N/A 08/07/2021   Procedure: RIGHT/LEFT HEART CATH AND CORONARY ANGIOGRAPHY;  Surgeon: Yvonne Kendall, MD;  Location: [facility] INVASIVE CV LAB;  Service: Cardiovascular;  Laterality: N/A;    Allergies  Allergen Reactions   Ibuprofen Other (See Comments)    History of GIB with ibuprofen    Sulfacetamide Other (See Comments)   Neomycin-Bacitracin Zn-Polymyx     Other reaction(s):  Other (See Comments)   Spironolactone     Hyperkalemia    Outpatient Encounter Medications as of 09/18/2022  Medication Sig   acetaminophen (TYLENOL) 325 MG tablet Take 650 mg by mouth at bedtime.   aspirin EC 81 MG tablet Take 1 tablet (81 mg total) by mouth daily. Swallow whole.   atorvastatin (LIPITOR) 80 MG tablet Take 1 tablet (80 mg total) by mouth every evening.   carvedilol (COREG) 12.5 MG tablet Take 1 tablet (12.5 mg total) by mouth 2 (two) times daily with a meal.   clopidogrel (PLAVIX) 75 MG tablet Take 1 tablet (75 mg total) by mouth daily.   empagliflozin (JARDIANCE) 10 MG TABS tablet Take 10 mg by mouth daily.   ezetimibe (ZETIA) 10 MG tablet Take 1 tablet (10 mg total) by mouth daily.   famotidine (PEPCID) 10 MG tablet Take 10 mg by mouth at bedtime. As needed   fenofibrate (TRICOR) 48 MG tablet Take 1 tablet (48 mg total) by mouth daily.   furosemide (LASIX) 20 MG tablet Take 20 mg by mouth daily as needed. For weight gain of 3 lb in 24 hours or 5 lbs in one week   isosorbide dinitrate (ISORDIL) 30 MG tablet Take 30 mg  by mouth daily.   isosorbide mononitrate (IMDUR) 30 MG 24 hr tablet Take 30 mg by mouth daily. One tablet daily by mouth.   nitroGLYCERIN (NITROSTAT) 0.4 MG SL tablet Place 1 tablet (0.4 mg total) under the tongue every 5 (five) minutes as needed for chest pain.   ondansetron (ZOFRAN) 4 MG tablet Take 1 tablet (4 mg total) by mouth every 8 (eight) hours as needed for nausea or vomiting.   sacubitril-valsartan (ENTRESTO) 24-26 MG Take 1 tablet by mouth 2 (two) times daily.   spironolactone (ALDACTONE) 25 MG tablet Take 0.5 tablets (12.5 mg total) by mouth daily.   [DISCONTINUED] isosorbide mononitrate (IMDUR) 30 MG 24 hr tablet Take 0.5 tablets (15 mg total) by mouth daily. (Patient taking differently: Take 30 mg by mouth daily.)   No facility-administered encounter medications on file as of 09/18/2022.    Review of Systems:  Review of Systems  Constitutional:   Negative for activity change, appetite change and fever.  HENT:  Negative for sore throat.   Eyes: Negative.   Respiratory:  Positive for shortness of breath.   Cardiovascular:  Negative for chest pain and leg swelling.  Gastrointestinal:  Negative for abdominal distention, diarrhea and vomiting.  Genitourinary:  Negative for dysuria, frequency and urgency.  Skin:  Negative for color change.  Neurological:  Negative for dizziness and headaches.  Psychiatric/Behavioral:  Negative for behavioral problems and sleep disturbance. The patient is not nervous/anxious.     Health Maintenance  Topic Date Due   COVID-19 Vaccine (3 - 2023-24 season) 01/16/2022   INFLUENZA VACCINE  12/17/2022   DTaP/Tdap/Td (2 - Td or Tdap) 05/19/2023   Hepatitis C Screening  Completed   HIV Screening  Completed   Zoster Vaccines- Shingrix  Completed   HPV VACCINES  Aged Out    Physical Exam: Vitals:   09/18/22 1450  BP: 110/88  Pulse: 61  Resp: 18  Temp: 97.6 F (36.4 C)  SpO2: 94%  Weight: 231 lb 4 oz (104.9 kg)  Height: 5\' 9"  (1.753 m)   Body mass index is 34.15 kg/m. Physical Exam Constitutional:      Appearance: Normal appearance.  HENT:     Head: Normocephalic and atraumatic.     Mouth/Throat:     Mouth: Mucous membranes are moist.  Eyes:     Conjunctiva/sclera: Conjunctivae normal.  Cardiovascular:     Rate and Rhythm: Normal rate and regular rhythm.     Pulses: Normal pulses.     Heart sounds: Normal heart sounds.  Pulmonary:     Effort: Pulmonary effort is normal.     Breath sounds: Normal breath sounds.  Abdominal:     General: Bowel sounds are normal.     Palpations: Abdomen is soft.     Comments: colostomy  Musculoskeletal:        General: No swelling. Normal range of motion.     Cervical back: Normal range of motion.  Skin:    General: Skin is warm and dry.  Neurological:     General: No focal deficit present.     Mental Status: He is alert and oriented to person,  place, and time.  Psychiatric:        Mood and Affect: Mood normal.        Behavior: Behavior normal.        Thought Content: Thought content normal.        Judgment: Judgment normal.     Labs reviewed: Basic Metabolic Panel: Recent Labs  10/11/21 0510 10/14/21 1057 10/20/21 2317 10/21/21 0850 07/15/22 1425 09/07/22 1725 09/10/22 1846 09/11/22 0541 09/12/22 0755 09/13/22 1129  NA 140   < >  --    < > 143   < > 137 138 138 141  K 4.7   < >  --    < > 4.7   < > 5.3* 4.5 4.5 4.3  CL 108   < >  --    < > 107   < > 109 112* 113* 113*  CO2 26   < >  --    < > 25   < > 23 20* 20* 23  GLUCOSE 100*   < >  --    < > 103*   < > 123* 120* 96 94  BUN 28*   < >  --    < > 17   < > 36* 36* 24* 24*  CREATININE 1.38*   < >  --    < > 1.37*   < > 2.28* 1.96* 1.27* 1.30*  CALCIUM 9.5   < >  --    < > 9.9   < > 9.8 8.9 8.8* 9.2  MG  --   --  2.0  --   --   --  2.2  --   --   --   PHOS 4.0  --   --   --   --   --   --   --   --   --   TSH  --   --   --   --  0.86  --   --   --   --   --    < > = values in this interval not displayed.   Liver Function Tests: Recent Labs    01/27/22 1400 07/15/22 1425 09/07/22 1725 09/11/22 0541  AST 25 26 26 20   ALT 35 42 31 31  ALKPHOS 87  --  92 65  BILITOT 1.3* 0.6 0.8 0.7  PROT 6.7 7.0 7.6 6.5  ALBUMIN 3.6  --  4.0 3.4*   No results for input(s): "LIPASE", "AMYLASE" in the last 8760 hours. No results for input(s): "AMMONIA" in the last 8760 hours. CBC: Recent Labs    10/14/21 1057 10/20/21 1943 07/15/22 1425 09/07/22 1725 09/10/22 1846 09/11/22 0541 09/12/22 0755  WBC 6.0   < > 7.3 7.5 9.1 7.3 6.2  NEUTROABS 3.8  --  4,636 5.0  --   --   --   HGB 13.2   < > 15.5 15.8 15.5 14.1 14.6  HCT 39.3   < > 46.1 47.6 47.0 41.9 43.0  MCV 83.8   < > 83.1 83.8 84.2 83.0 82.4  PLT 176   < > 237 242 273 205 189   < > = values in this interval not displayed.   Lipid Panel: Recent Labs    10/07/21 0309 01/27/22 1410 07/15/22 1425  CHOL 149  148 147  HDL 30* 35* 39*  LDLCALC 43 70 81  TRIG 382* 214* 177*  CHOLHDL 5.0 4.2 3.8   Lab Results  Component Value Date   HGBA1C 5.4 08/05/2021    Procedures since last visit: ECHOCARDIOGRAM COMPLETE  Result Date: 09/11/2022    ECHOCARDIOGRAM REPORT   Patient Name:   Clarence [name] Date of Exam: 09/11/2022 Medical Rec #:  [id number]         Height:       70.0 in Accession #:  [id number]        Weight:       229.3 lb Date of Birth:  Jan 17, 1971          BSA:          2.212 m Patient Age:    51 years          BP:           128/83 mmHg Patient Gender: M                 HR:           50 bpm. Exam Location:  [facility] Procedure: 2D Echo, Cardiac Doppler and Color Doppler Indications:     Acute ischemic heart disease, unspecified I24.9  History:         Patient has prior history of Echocardiogram examinations, most                  recent 10/07/2021. CAD; Risk Factors:Hypertension.  Sonographer:     Cristela Blue Referring Phys:  [id number] Antonieta Iba Diagnosing Phys: Julien Nordmann MD IMPRESSIONS  1. Left ventricular ejection fraction, by estimation, is 45 to 50%. The left ventricle has mildly decreased function. The left ventricle demonstrates regional wall motion abnormalities (hypokinesis of the inferior and inferolateral wall). There is moderate left ventricular hypertrophy. Left ventricular diastolic parameters are consistent with Grade I diastolic dysfunction (impaired relaxation).  2. Right ventricular systolic function is normal. The right ventricular size is normal. Tricuspid regurgitation signal is inadequate for assessing PA pressure.  3. The mitral valve is normal in structure. No evidence of mitral valve regurgitation. No evidence of mitral stenosis.  4. The aortic valve is tricuspid. Aortic valve regurgitation is not visualized. No aortic stenosis is present.  5. The inferior vena cava is normal in size with greater than 50% respiratory variability, suggesting right atrial pressure of 3 mmHg.  FINDINGS  Left Ventricle: Left ventricular ejection fraction, by estimation, is 45 to 50%. The left ventricle has mildly decreased function. The left ventricle demonstrates regional wall motion abnormalities. The left ventricular internal cavity size was normal in size. There is moderate left ventricular hypertrophy. Left ventricular diastolic parameters are consistent with Grade I diastolic dysfunction (impaired relaxation). Right Ventricle: The right ventricular size is normal. No increase in right ventricular wall thickness. Right ventricular systolic function is normal. Tricuspid regurgitation signal is inadequate for assessing PA pressure. Left Atrium: Left atrial size was normal in size. Right Atrium: Right atrial size was normal in size. Pericardium: There is no evidence of pericardial effusion. Mitral Valve: The mitral valve is normal in structure. No evidence of mitral valve regurgitation. No evidence of mitral valve stenosis. MV peak gradient, 2.7 mmHg. The mean mitral valve gradient is 1.0 mmHg. Tricuspid Valve: The tricuspid valve is normal in structure. Tricuspid valve regurgitation is not demonstrated. No evidence of tricuspid stenosis. Aortic Valve: The aortic valve is tricuspid. Aortic valve regurgitation is not visualized. No aortic stenosis is present. Aortic valve mean gradient measures 3.0 mmHg. Aortic valve peak gradient measures 4.4 mmHg. Aortic valve area, by VTI measures 3.28 cm. Pulmonic Valve: The pulmonic valve was normal in structure. Pulmonic valve regurgitation is not visualized. No evidence of pulmonic stenosis. Aorta: The aortic root is normal in size and structure. Venous: The inferior vena cava is normal in size with greater than 50% respiratory variability, suggesting right atrial pressure of 3 mmHg. IAS/Shunts: No atrial level shunt detected by color flow Doppler.  LEFT VENTRICLE PLAX  2D LVIDd:         5.10 cm      Diastology LVIDs:         4.20 cm      LV e' medial:    5.00 cm/s  LV PW:         1.30 cm      LV E/e' medial:  10.3 LV IVS:        2.10 cm      LV e' lateral:   4.24 cm/s LVOT diam:     2.30 cm      LV E/e' lateral: 12.1 LV SV:         65 LV SV Index:   29 LVOT Area:     4.15 cm  LV Volumes (MOD) LV vol d, MOD A2C: 177.0 ml LV vol d, MOD A4C: 160.0 ml LV vol s, MOD A2C: 82.9 ml LV vol s, MOD A4C: 73.6 ml LV SV MOD A2C:     94.1 ml LV SV MOD A4C:     160.0 ml LV SV MOD BP:      97.2 ml RIGHT VENTRICLE RV Basal diam:  4.10 cm RV Mid diam:    3.00 cm RV S prime:     13.30 cm/s TAPSE (M-mode): 1.9 cm LEFT ATRIUM             Index        RIGHT ATRIUM           Index LA diam:        3.30 cm 1.49 cm/m   RA Area:     16.40 cm LA Vol (A2C):   43.6 ml 19.71 ml/m  RA Volume:   44.50 ml  20.12 ml/m LA Vol (A4C):   31.8 ml 14.38 ml/m LA Biplane Vol: 39.6 ml 17.90 ml/m  AORTIC VALVE AV Area (Vmax):    2.90 cm AV Area (Vmean):   2.67 cm AV Area (VTI):     3.28 cm AV Vmax:           105.00 cm/s AV Vmean:          77.100 cm/s AV VTI:            0.199 m AV Peak Grad:      4.4 mmHg AV Mean Grad:      3.0 mmHg LVOT Vmax:         73.40 cm/s LVOT Vmean:        49.600 cm/s LVOT VTI:          0.157 m LVOT/AV VTI ratio: 0.79  AORTA Ao Root diam: 3.50 cm MITRAL VALVE MV Area (PHT): 2.52 cm    SHUNTS MV Area VTI:   1.91 cm    Systemic VTI:  0.16 m MV Peak grad:  2.7 mmHg    Systemic Diam: 2.30 cm MV Mean grad:  1.0 mmHg MV Vmax:       0.81 m/s MV Vmean:      49.2 cm/s MV Decel Time: 301 msec MV E velocity: 51.40 cm/s MV A velocity: 75.50 cm/s MV E/A ratio:  0.68 Julien Nordmann MD Electronically signed by Julien Nordmann MD Signature Date/Time: 09/11/2022/1:31:03 PM    Final    CT ABDOMEN PELVIS WO CONTRAST  Result Date: 09/10/2022 CLINICAL DATA:  Abdominal pain, acute, nonlocalized h/o crohn's, diarrhea, abd pain EXAM: CT ABDOMEN AND PELVIS WITHOUT CONTRAST TECHNIQUE: Multidetector CT imaging of the abdomen and pelvis was performed following the standard protocol without IV  contrast. RADIATION  DOSE REDUCTION: This exam was performed according to the departmental dose-optimization program which includes automated exposure control, adjustment of the mA and/or kV according to patient size and/or use of iterative reconstruction technique. COMPARISON:  10/10/2021 FINDINGS: Lower chest: No acute abnormality Hepatobiliary: Gallbladder contracted. No focal hepatic abnormality. Pancreas: No focal abnormality or ductal dilatation. Spleen: No focal abnormality.  Normal size. Adrenals/Urinary Tract: No adrenal abnormality. No focal renal abnormality. No stones or hydronephrosis. Urinary bladder is unremarkable. Stomach/Bowel: Prior colectomy. Residual Hartmann's pouch. Right lower quadrant ostomy. 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 No bowel obstruction. Vascular/Lymphatic: No evidence of aneurysm or adenopathy. Reproductive: No visible focal abnormality. Other: No free fluid or free air. Musculoskeletal: No acute bony abnormality. IMPRESSION: No acute findings. Prior colectomy. 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 No bowel obstruction. No change since prior study. Electronically Signed   By: Charlett Nose M.D.   On: 09/10/2022 23:20   DG Chest 2 View  Result Date: 09/10/2022 CLINICAL DATA:  Dizziness. EXAM: CHEST - 2 VIEW COMPARISON:  October 20, 2021 FINDINGS: The heart size and mediastinal contours are within normal limits. Both lungs are clear. The visualized skeletal structures are unremarkable. IMPRESSION: No active cardiopulmonary disease. Electronically Signed   By: Aram Candela M.D.   On: 09/10/2022 19:25    Assessment/Plan     Labs/tests ordered:    Next appt:  01/29/2023

## 2022-09-18 NOTE — Progress Notes (Signed)
[facility] HEART FAILURE CLINIC - Pharmacist Education Note  Assessment Clarence [name] is a 52 y.o. male with HFimpEF (baseline EF <40% with a follow-up EF >40%) presenting to the Heart Failure Clinic for follow up. Patient reports no issues obtaining medications and uses [employer]. He reports some chest pain since starting Imdur and decreasing Entresto. He states that he has trouble identifying whether this chest pain is due to indigestion or a cardiac cause. He reports that he avoids large amounts of caffeine, fried foods, and tries to minimize spicy foods. He repots pretty consistent shortness of breath and that he is usually unable to talk while walking. He denies any appetite issues. Based on medicine reconciliation, patient deviates from prescribed dosing and frequency on several medications, but does take them regularly. Medication list has been updated to reflect these deviations.   Recent ED Visit (past 6 months):  Date: 09/10/2022, CC: hypotension and dehydration Date: 09/07/2022, CC: hypotension  Date: 08/02/2022, CC: dental pain  Guideline-Directed Medical Therapy/Evidence Based Medicine ACE/ARB/ARNI: Sacubitril/valsartan 24/26 mg BID Beta Blocker: Carvedilol 12.5 mg twice daily Aldosterone Antagonist: Spironolactone 12.5 mg daily Diuretic: Furosemide 20 mg as needed (reports use every 2-3 days) SGLT2i: Empagliflozin 10 mg daily  Adherence Assessment Do you ever forget to take your medication? [] Yes [x] No  Do you ever skip doses due to side effects? [] Yes [x] No  Do you have trouble affording your medicines? [] Yes [x] No  Are you ever unable to pick up your medication due to transportation difficulties? [] Yes [x] No  Do you ever stop taking your medications because you don't believe they are helping? [] Yes [x] No  Do you check your weight daily? [x] Yes [] No  Adherence strategy: pill box Barriers to obtaining medications: none reported  Diagnostics ECHO: Date  09/11/2022, EF 45-50%, RWMA, moderate LVH, G1DD Cath: Date 08/07/2021, increased filling pressures, moderate to severe pulmonary HTN.   Vitals    09/18/2022    1:17 PM 09/13/2022   12:30 PM 09/13/2022    7:43 AM  Vitals with BMI  Weight 233 lbs 2 oz    BMI 33.45    Systolic 116 145 161  Diastolic 83 92 76  Pulse 64 59 65     Recent Labs    Latest Ref Rng & Units 09/13/2022   11:29 AM 09/12/2022    7:55 AM 09/11/2022    5:41 AM  BMP  Glucose 70 - 99 mg/dL 94  96  096   BUN 6 - 20 mg/dL 24  24  36   Creatinine 0.61 - 1.24 mg/dL 0.45  4.09  8.11   Sodium 135 - 145 mmol/L 141  138  138   Potassium 3.5 - 5.1 mmol/L 4.3  4.5  4.5   Chloride 98 - 111 mmol/L 113  113  112   CO2 22 - 32 mmol/L 23  20  20    Calcium 8.9 - 10.3 mg/dL 9.2  8.8  8.9     Past Medical History Past Medical History:  Diagnosis Date   Ascending Aortic Dilation    a. 09/2021 Echo: Asc Ao 40mm.   CAD (coronary artery disease)    a. 07/2021 Cath: LM nl, LAD 37m, D2 40, LCX 65p/m, OM1 90, RCA 18m w/ L-.R collats to RPDA from septal 1/2-->Med rx.   Chronic HFrEF (heart failure with reduced ejection fraction) (HCC)    a. 07/2021 Echo: EF 30-35%, glob HK, mod LVH, GrII DD, nl RV fxn, mildly dil LA, mild-mod MR; b. 09/2021  Echo: EF 35-40%, glob HK, mod LVH, GrI DD, nl RV fxn, mildly dil RA, mild MR, Asc Ao 40mm.   CKD (chronic kidney disease) stage 2, GFR 60-89 ml/min    Crohn's disease (HCC)    Hypertension    Mixed Ischemic & Nonischemic cardiomyopathy    a. 07/2021 Echo: EF 30-35%; b. 07/2021 Cath: Occluded RCA w/ mod LAD/LCX dzs, and severe OM1 dzs-->Med Rx; c. 09/2021 Echo: EF 35-40%.   PAH (pulmonary artery hypertension) (HCC)    a. 07/2021 RHC: PA 67/30 (42).   Proteinuria     Plan ReDS today was 36% Continue regimen as directed by NP Continue daily weights   Time spent: 10 minutes  Celene Squibb, PharmD PGY1 Pharmacy Resident 09/18/2022 2:11 PM

## 2022-09-19 LAB — BASIC METABOLIC PANEL WITH GFR
BUN/Creatinine Ratio: 15 (calc) (ref 6–22)
BUN: 25 mg/dL (ref 7–25)
CO2: 28 mmol/L (ref 20–32)
Calcium: 9.8 mg/dL (ref 8.6–10.3)
Chloride: 108 mmol/L (ref 98–110)
Creat: 1.72 mg/dL — ABNORMAL HIGH (ref 0.70–1.30)
Glucose, Bld: 94 mg/dL (ref 65–99)
Potassium: 4.9 mmol/L (ref 3.5–5.3)
Sodium: 143 mmol/L (ref 135–146)
eGFR: 47 mL/min/{1.73_m2} — ABNORMAL LOW (ref >=60)

## 2022-09-20 NOTE — Progress Notes (Signed)
-    K 4.9, normal

## 2022-09-21 ENCOUNTER — Other Ambulatory Visit: Payer: Self-pay

## 2022-09-21 ENCOUNTER — Other Ambulatory Visit
Admission: RE | Admit: 2022-09-21 | Discharge: 2022-09-21 | Disposition: A | Discharge status: Home / Self Care | Payer: Medicaid Other | Source: Ambulatory Visit | Attending: Cardiology | Admitting: Cardiology

## 2022-09-21 ENCOUNTER — Encounter: Payer: Self-pay | Admitting: *Deleted

## 2022-09-21 ENCOUNTER — Encounter: Payer: Self-pay | Admitting: Cardiology

## 2022-09-21 ENCOUNTER — Emergency Department: Payer: Medicaid Other

## 2022-09-21 ENCOUNTER — Ambulatory Visit (HOSPITAL_BASED_OUTPATIENT_CLINIC_OR_DEPARTMENT_OTHER): Payer: Medicaid Other | Admitting: Cardiology

## 2022-09-21 VITALS — BP 121/74 | HR 51 | Resp 16 | Wt 230.0 lb

## 2022-09-21 DIAGNOSIS — I2510 Atherosclerotic heart disease of native coronary artery without angina pectoris: Secondary | ICD-10-CM | POA: Insufficient documentation

## 2022-09-21 DIAGNOSIS — Z7902 Long term (current) use of antithrombotics/antiplatelets: Secondary | ICD-10-CM | POA: Diagnosis not present

## 2022-09-21 DIAGNOSIS — T50995A Adverse effect of other drugs, medicaments and biological substances, initial encounter: Secondary | ICD-10-CM | POA: Diagnosis not present

## 2022-09-21 DIAGNOSIS — I5020 Unspecified systolic (congestive) heart failure: Secondary | ICD-10-CM | POA: Diagnosis not present

## 2022-09-21 DIAGNOSIS — N189 Chronic kidney disease, unspecified: Secondary | ICD-10-CM | POA: Insufficient documentation

## 2022-09-21 DIAGNOSIS — R42 Dizziness and giddiness: Secondary | ICD-10-CM | POA: Diagnosis not present

## 2022-09-21 DIAGNOSIS — I5022 Chronic systolic (congestive) heart failure: Secondary | ICD-10-CM

## 2022-09-21 DIAGNOSIS — I5042 Chronic combined systolic (congestive) and diastolic (congestive) heart failure: Secondary | ICD-10-CM

## 2022-09-21 DIAGNOSIS — T45525A Adverse effect of antithrombotic drugs, initial encounter: Secondary | ICD-10-CM | POA: Diagnosis not present

## 2022-09-21 LAB — BASIC METABOLIC PANEL
Anion gap: 7 (ref 5–15)
Anion gap: 9 (ref 5–15)
BUN: 29 mg/dL — ABNORMAL HIGH (ref 6–20)
BUN: 30 mg/dL — ABNORMAL HIGH (ref 6–20)
CO2: 28 mmol/L (ref 22–32)
CO2: 27 mmol/L (ref 22–32)
Calcium: 9.6 mg/dL (ref 8.9–10.3)
Calcium: 9.4 mg/dL (ref 8.9–10.3)
Chloride: 106 mmol/L (ref 98–111)
Chloride: 105 mmol/L (ref 98–111)
Creatinine, Ser: 1.48 mg/dL — ABNORMAL HIGH (ref 0.61–1.24)
Creatinine, Ser: 1.56 mg/dL — ABNORMAL HIGH (ref 0.61–1.24)
GFR, Estimated: 56 mL/min — ABNORMAL LOW (ref >60)
GFR, Estimated: 53 mL/min — ABNORMAL LOW (ref >60)
Glucose, Bld: 98 mg/dL (ref 70–99)
Glucose, Bld: 129 mg/dL — ABNORMAL HIGH (ref 70–99)
Potassium: 5.2 mmol/L — ABNORMAL HIGH (ref 3.5–5.1)
Potassium: 3.7 mmol/L (ref 3.5–5.1)
Sodium: 141 mmol/L (ref 135–145)
Sodium: 141 mmol/L (ref 135–145)

## 2022-09-21 LAB — LIPID PANEL
Cholesterol: 125 mg/dL (ref 0–200)
HDL: 33 mg/dL — ABNORMAL LOW (ref >40)
LDL Cholesterol: 59 mg/dL (ref 0–99)
Total CHOL/HDL Ratio: 3.8 RATIO
Triglycerides: 165 mg/dL — ABNORMAL HIGH (ref <150)
VLDL: 33 mg/dL (ref 0–40)

## 2022-09-21 LAB — CBC
HCT: 45.2 % (ref 39.0–52.0)
HCT: 43.6 % (ref 39.0–52.0)
Hemoglobin: 14.9 g/dL (ref 13.0–17.0)
Hemoglobin: 14.3 g/dL (ref 13.0–17.0)
MCH: 27.9 pg (ref 26.0–34.0)
MCH: 27.6 pg (ref 26.0–34.0)
MCHC: 33 g/dL (ref 30.0–36.0)
MCHC: 32.8 g/dL (ref 30.0–36.0)
MCV: 84.5 fL (ref 80.0–100.0)
MCV: 84 fL (ref 80.0–100.0)
Platelets: 230 10*3/uL (ref 150–400)
Platelets: 222 10*3/uL (ref 150–400)
RBC: 5.35 MIL/uL (ref 4.22–5.81)
RBC: 5.19 MIL/uL (ref 4.22–5.81)
RDW: 13.6 % (ref 11.5–15.5)
RDW: 13.6 % (ref 11.5–15.5)
WBC: 7.6 10*3/uL (ref 4.0–10.5)
WBC: 8.1 10*3/uL (ref 4.0–10.5)
nRBC: 0 % (ref 0.0–0.2)
nRBC: 0 % (ref 0.0–0.2)

## 2022-09-21 LAB — BRAIN NATRIURETIC PEPTIDE: B Natriuretic Peptide: 95.8 pg/mL (ref 0.0–100.0)

## 2022-09-21 LAB — TROPONIN I (HIGH SENSITIVITY): Troponin I (High Sensitivity): 36 ng/L — ABNORMAL HIGH (ref <18)

## 2022-09-21 MED ORDER — CARVEDILOL 6.25 MG PO TABS: 6.2500 mg | ORAL_TABLET | Freq: Two times a day (BID) | ORAL | 3 refills | Status: DC

## 2022-09-21 MED ORDER — RANOLAZINE ER 500 MG PO TB12: 500.0000 mg | ORAL_TABLET | Freq: Two times a day (BID) | ORAL | 3 refills | Status: DC

## 2022-09-21 NOTE — Progress Notes (Signed)
PCP: Ngetich, Dinah C, NP Cardiology: Dr. End HF Cardiology: Dr Tashiya Souders  51 y.o. with history of CAD, chronic systolic CHF, Crohns disease, CKD stage 3, and HTN was referred by Dr. End to HF clinic for evaluation of CHF.  Patient was admitted in 3/23 with newly found cardiomyopathy, CHF and atypical PNA.  Echo in 3/23 showed EF 30-35%, moderate LVH, normal RV. LHC showed occluded RCA with collaterals, 60% mLAD, 65% mLCx, 90% mid OM1.  RHC showed elevated filling pressures and low CI (2).  Patient was admitted again in 4/23 with CHF. He was admitted in 5/23 with hypertensive urgency and chest pain, CTA abdomen/pelvis did not show renal artery stenosis. He was admitted again in 6/23 with hypertensive urgency.  He quit smoking after 3/23 admission.  Rarely drinks ETOH and no drugs.  No strong family history of CAD or cardiomyopathy.    Sleep study was done, not suggestive of OSA.   He was admitted in 4/24 with chest pain => no evidence for ACS.  He had severe watery diarrhea via his ostomy and was dehydrated. Meds were held. Echo in 4/24 was improved, showing EF 45-50%, inferior and inferolateral hypokinesis, normal RV.   Today he returns for HF follow up. He has been having more chest pain for the last 3 weeks or so.  The pain is not clearly exertional.  It is like a band across his chest, sometimes lasts a prolonged period of time. He is getting pain on most days.  BP has been dropping, he is lightheaded with standing and walking. SBP to 80s on home recordings.  Today's BP in the office is quite high for him.  He is short of breath walking 100 feeet or if he tries to walk and talk. Weight down 3 lbs.   ECG (personally reviewed): NSR, old inferior MI, lateral TWIs.   Labs (5/23): LDL 43, TGs 382 Labs (6/23): K 4.2, creatinine 1.16 Labs (7/23): K 4.2, creatinine 1.42, LDL 70, TGs 214 Labs (11/23): K 4.1, creatinine 1.27 Labs (2/24): LDL 81 Labs (5/24): K 4.9, creatinine 1.72  PMH: 1. Crohns  disease: s/p colectomy with ostomy.  2. CKD stage 3: History of hyperkalemia.  3. HTN: CT abdomen/pelvis with no evidence for renal artery stenosis.  4. Pulmonary nodules: Stable on last CT.  5. CAD: LHC 3/23 with CTO mid RCA with collaterals, 60% mLAD, 65% mLCx, 90% mid OM1.  6. Chronic systolic CHF: Ischemic cardiomyopathy (cannot fully rule out nonischemic component).  - Echo (3/23): EF 30-35%, moderate LVH, normal RV. - RHC (3/23): mean RA 16, PA 67/30 mean 42, mean PCWP 25, CI 2, PVR 4 - Echo (5/23): EF 35-40%, global HK, moderate LVH, RV normal, mild MR - Echo (4/24): EF 45-50%, inferior and inferolateral hypokinesis, normal RV.  7. Sleep study negative for OSA.   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   FH: Uncles with "heart trouble"  ROS: All systems reviewed and negative except as per HPI.   Current Outpatient Medications  Medication Sig Dispense Refill   acetaminophen (TYLENOL) 325 MG tablet Take 650 mg by mouth at bedtime.     aspirin EC 81 MG tablet Take 1 tablet (81 mg total) by mouth daily. Swallow whole. 30 tablet 4   atorvastatin (LIPITOR) 80 MG tablet Take 1 tablet (80 mg total) by mouth every evening. 30 tablet 4   clopidogrel (PLAVIX) 75 MG tablet Take 1 tablet (  75 mg total) by mouth daily. 30 tablet 6   empagliflozin (JARDIANCE) 10 MG TABS tablet Take 10 mg by mouth daily.     ezetimibe (ZETIA) 10 MG tablet Take 1 tablet (10 mg total) by mouth daily. 90 tablet 3   famotidine (PEPCID) 10 MG tablet Take 10 mg by mouth at bedtime. As needed     fenofibrate (TRICOR) 48 MG tablet Take 1 tablet (48 mg total) by mouth daily. 30 tablet 8   furosemide (LASIX) 20 MG tablet Take 20 mg by mouth daily as needed. For weight gain of 3 lb in 24 hours or 5 lbs in one week     isosorbide mononitrate (IMDUR) 30 MG 24 hr tablet Take 30 mg by mouth daily. One tablet daily by mouth.     ondansetron (ZOFRAN) 4 MG tablet  Take 1 tablet (4 mg total) by mouth every 8 (eight) hours as needed for nausea or vomiting. 20 tablet 0   sacubitril-valsartan (ENTRESTO) 24-26 MG Take 1 tablet by mouth 2 (two) times daily. 60 tablet 0   spironolactone (ALDACTONE) 25 MG tablet Take 0.5 tablets (12.5 mg total) by mouth daily. 45 tablet 1   carvedilol (COREG) 6.25 MG tablet Take 1 tablet (6.25 mg total) by mouth 2 (two) times daily. 180 tablet 3   isosorbide dinitrate (ISORDIL) 30 MG tablet Take 30 mg by mouth daily. (Patient not taking: Reported on 09/21/2022)     nitroGLYCERIN (NITROSTAT) 0.4 MG SL tablet Place 1 tablet (0.4 mg total) under the tongue every 5 (five) minutes as needed for chest pain. (Patient not taking: Reported on 09/21/2022) 25 tablet 3   ranolazine (RANEXA) 500 MG 12 hr tablet Take 1 tablet (500 mg total) by mouth 2 (two) times daily. 60 tablet 3   No current facility-administered medications for this visit.   Wt Readings from Last 3 Encounters:  09/21/22 230 lb (104.3 kg)  09/18/22 231 lb 4 oz (104.9 kg)  09/18/22 233 lb 2 oz (105.7 kg)   BP 121/74 (BP Location: Right Arm, Patient Position: Sitting, Cuff Size: Normal)   Pulse (!) 51   Resp 16   Wt 230 lb (104.3 kg)   SpO2 97%   BMI 33.97 kg/m  General: NAD Neck: No JVD, no thyromegaly or thyroid nodule.  Lungs: Clear to auscultation bilaterally with normal respiratory effort. CV: Nondisplaced PMI.  Heart regular S1/S2, no S3/S4, no murmur.  No peripheral edema.  No carotid bruit.  Normal pedal pulses.  Abdomen: Soft, nontender, no hepatosplenomegaly, no distention.  Skin: Intact without lesions or rashes.  Neurologic: Alert and oriented x 3.  Psych: Normal affect. Extremities: No clubbing or cyanosis.  HEENT: Normal.   Assessment/Plan: 1. Chronic systolic CHF: Suspect primarily ischemic cardiomyopathy but cannot rule out component of nonischemic cardiomyopathy. RHC in 3/23 with elevated filling pressures and low CI, 2. Echo in 5/23 with EF 35-40%,  normal RV.  Echo in 4/24 with EF up to 45-50%, inferior/inferolateral hypokinesis. Still with NYHA class II-III symptoms.  Not volume overloaded on exam.  - Continue Entresto 24/26 bid, no room to increase with orthostatic symptoms.  - Using Lasix prn.  - With orthostatic symptoms, will decrease Coreg to 6.25 mg bid.  - Continue Jardiance 10 mg daily.  - With orthostatic symptoms, take spironolactone 12.5 at bedtime.  - EF is out of ICD range.  - BMET/BNP today.   2. CAD: Cath in 3/23 with CTO mid RCA with collaterals, 60% mLAD, 65% mLCx, 90%   mid OM1.  Managed medically.  He has had more frequent chest pain episodes over the last 3 wks.  Admitted in 4/24 with chest pain, rule out MI.  - Continue Imdur 30 mg daily, will not increase with orthostatic symptoms. We are also cutting back Coreg with orthostatic symptoms.  - I will have him start ranolazine 500 mg bid for angina.  - Continue ASA 81 and Plavix 75 daily.  - Continue atorvastatin and Zetia, check lipids today.  - Continue fenofibrate with elevated triglycerides.  - With ongoing chest pain episodes, I will arrange for RHC/LHC to assess coronaries and filling pressures.  If CAD has worsened, he may be a CABG candidate. We discussed risks/benefits and he agrees to procedure.  3. CKD: Stage 3.  Follow closely.    Followup in 3 wks.   Hezekiah Veltre 09/21/2022 

## 2022-09-21 NOTE — ED Triage Notes (Addendum)
Pt comes with c/o possible allergic reaction. Pt states he took 1 pill of Ranolazine 500 mg tablet today and then started having several symptoms. Pt states he is having some tightness in chest, blurry vision and numbness to face.  Pt states blurry vision still when trying to read small letters.

## 2022-09-21 NOTE — H&P (View-Only) (Signed)
PCP: Caesar Bookman, NP Cardiology: Dr. Okey Dupre HF Cardiology: Dr Shirlee Latch  52 y.o. with history of CAD, chronic systolic CHF, Crohns disease, CKD stage 3, and HTN was referred by Dr. Okey Dupre to HF clinic for evaluation of CHF.  Patient was admitted in 3/23 with newly found cardiomyopathy, CHF and atypical PNA.  Echo in 3/23 showed EF 30-35%, moderate LVH, normal RV. LHC showed occluded RCA with collaterals, 60% mLAD, 65% mLCx, 90% mid OM1.  RHC showed elevated filling pressures and low CI (2).  Patient was admitted again in 4/23 with CHF. He was admitted in 5/23 with hypertensive urgency and chest pain, CTA abdomen/pelvis did not show renal artery stenosis. He was admitted again in 6/23 with hypertensive urgency.  He quit smoking after 3/23 admission.  Rarely drinks ETOH and no drugs.  No strong family history of CAD or cardiomyopathy.    Sleep study was done, not suggestive of OSA.   He was admitted in 4/24 with chest pain => no evidence for ACS.  He had severe watery diarrhea via his ostomy and was dehydrated. Meds were held. Echo in 4/24 was improved, showing EF 45-50%, inferior and inferolateral hypokinesis, normal RV.   Today he returns for HF follow up. He has been having more chest pain for the last 3 weeks or so.  The pain is not clearly exertional.  It is like a band across his chest, sometimes lasts a prolonged period of time. He is getting pain on most days.  BP has been dropping, he is lightheaded with standing and walking. SBP to 80s on home recordings.  Today's BP in the office is quite high for him.  He is short of breath walking 100 feeet or if he tries to walk and talk. Weight down 3 lbs.   ECG (personally reviewed): NSR, old inferior MI, lateral TWIs.   Labs (5/23): LDL 43, TGs 382 Labs (6/23): K 4.2, creatinine 1.16 Labs (7/23): K 4.2, creatinine 1.42, LDL 70, TGs 214 Labs (11/23): K 4.1, creatinine 1.27 Labs (2/24): LDL 81 Labs (5/24): K 4.9, creatinine 1.72  PMH: 1. Crohns  disease: s/p colectomy with ostomy.  2. CKD stage 3: History of hyperkalemia.  3. HTN: CT abdomen/pelvis with no evidence for renal artery stenosis.  4. Pulmonary nodules: Stable on last CT.  5. CAD: LHC 3/23 with CTO mid RCA with collaterals, 60% mLAD, 65% mLCx, 90% mid OM1.  6. Chronic systolic CHF: Ischemic cardiomyopathy (cannot fully rule out nonischemic component).  - Echo (3/23): EF 30-35%, moderate LVH, normal RV. - RHC (3/23): mean RA 16, PA 67/30 mean 42, mean PCWP 25, CI 2, PVR 4 - Echo (5/23): EF 35-40%, global HK, moderate LVH, RV normal, mild MR - Echo (4/24): EF 45-50%, inferior and inferolateral hypokinesis, normal RV.  7. Sleep study negative for OSA.   SH: Single, quit smoking in 3/23, rare ETOH/no drugs, lives in [place] and works for a [employer].   FH: Uncles with "heart trouble"  ROS: All systems reviewed and negative except as per HPI.   Current Outpatient Medications  Medication Sig Dispense Refill   acetaminophen (TYLENOL) 325 MG tablet Take 650 mg by mouth at bedtime.     aspirin EC 81 MG tablet Take 1 tablet (81 mg total) by mouth daily. Swallow whole. 30 tablet 4   atorvastatin (LIPITOR) 80 MG tablet Take 1 tablet (80 mg total) by mouth every evening. 30 tablet 4   clopidogrel (PLAVIX) 75 MG tablet Take 1 tablet (  75 mg total) by mouth daily. 30 tablet 6   empagliflozin (JARDIANCE) 10 MG TABS tablet Take 10 mg by mouth daily.     ezetimibe (ZETIA) 10 MG tablet Take 1 tablet (10 mg total) by mouth daily. 90 tablet 3   famotidine (PEPCID) 10 MG tablet Take 10 mg by mouth at bedtime. As needed     fenofibrate (TRICOR) 48 MG tablet Take 1 tablet (48 mg total) by mouth daily. 30 tablet 8   furosemide (LASIX) 20 MG tablet Take 20 mg by mouth daily as needed. For weight gain of 3 lb in 24 hours or 5 lbs in one week     isosorbide mononitrate (IMDUR) 30 MG 24 hr tablet Take 30 mg by mouth daily. One tablet daily by mouth.     ondansetron (ZOFRAN) 4 MG tablet  Take 1 tablet (4 mg total) by mouth every 8 (eight) hours as needed for nausea or vomiting. 20 tablet 0   sacubitril-valsartan (ENTRESTO) 24-26 MG Take 1 tablet by mouth 2 (two) times daily. 60 tablet 0   spironolactone (ALDACTONE) 25 MG tablet Take 0.5 tablets (12.5 mg total) by mouth daily. 45 tablet 1   carvedilol (COREG) 6.25 MG tablet Take 1 tablet (6.25 mg total) by mouth 2 (two) times daily. 180 tablet 3   isosorbide dinitrate (ISORDIL) 30 MG tablet Take 30 mg by mouth daily. (Patient not taking: Reported on 09/21/2022)     nitroGLYCERIN (NITROSTAT) 0.4 MG SL tablet Place 1 tablet (0.4 mg total) under the tongue every 5 (five) minutes as needed for chest pain. (Patient not taking: Reported on 09/21/2022) 25 tablet 3   ranolazine (RANEXA) 500 MG 12 hr tablet Take 1 tablet (500 mg total) by mouth 2 (two) times daily. 60 tablet 3   No current facility-administered medications for this visit.   Wt Readings from Last 3 Encounters:  09/21/22 230 lb (104.3 kg)  09/18/22 231 lb 4 oz (104.9 kg)  09/18/22 233 lb 2 oz (105.7 kg)   BP 121/74 (BP Location: Right Arm, Patient Position: Sitting, Cuff Size: Normal)   Pulse (!) 51   Resp 16   Wt 230 lb (104.3 kg)   SpO2 97%   BMI 33.97 kg/m  General: NAD Neck: No JVD, no thyromegaly or thyroid nodule.  Lungs: Clear to auscultation bilaterally with normal respiratory effort. CV: Nondisplaced PMI.  Heart regular S1/S2, no S3/S4, no murmur.  No peripheral edema.  No carotid bruit.  Normal pedal pulses.  Abdomen: Soft, nontender, no hepatosplenomegaly, no distention.  Skin: Intact without lesions or rashes.  Neurologic: Alert and oriented x 3.  Psych: Normal affect. Extremities: No clubbing or cyanosis.  HEENT: Normal.   Assessment/Plan: 1. Chronic systolic CHF: Suspect primarily ischemic cardiomyopathy but cannot rule out component of nonischemic cardiomyopathy. RHC in 3/23 with elevated filling pressures and low CI, 2. Echo in 5/23 with EF 35-40%,  normal RV.  Echo in 4/24 with EF up to 45-50%, inferior/inferolateral hypokinesis. Still with NYHA class II-III symptoms.  Not volume overloaded on exam.  - Continue Entresto 24/26 bid, no room to increase with orthostatic symptoms.  - Using Lasix prn.  - With orthostatic symptoms, will decrease Coreg to 6.25 mg bid.  - Continue Jardiance 10 mg daily.  - With orthostatic symptoms, take spironolactone 12.5 at bedtime.  - EF is out of ICD range.  - BMET/BNP today.   2. CAD: Cath in 3/23 with CTO mid RCA with collaterals, 60% mLAD, 65% mLCx, 90%  mid OM1.  Managed medically.  He has had more frequent chest pain episodes over the last 3 wks.  Admitted in 4/24 with chest pain, rule out MI.  - Continue Imdur 30 mg daily, will not increase with orthostatic symptoms. We are also cutting back Coreg with orthostatic symptoms.  - I will have him start ranolazine 500 mg bid for angina.  - Continue ASA 81 and Plavix 75 daily.  - Continue atorvastatin and Zetia, check lipids today.  - Continue fenofibrate with elevated triglycerides.  - With ongoing chest pain episodes, I will arrange for RHC/LHC to assess coronaries and filling pressures.  If CAD has worsened, he may be a CABG candidate. We discussed risks/benefits and he agrees to procedure.  3. CKD: Stage 3.  Follow closely.    Followup in 3 wks.   Marca Ancona 09/21/2022

## 2022-09-21 NOTE — Patient Instructions (Addendum)
Medication Changes:  DECREASE Coreg/carvedilol to 6.25 mg twice daily. New prescription sent.  CHANGE how you take Spironolactone to 12.5 (half a tablet) at bedtime START Ranolazine 500 mg twice daily  Lab Work:  Labs done today, your results will be available in MyChart, we will contact you for abnormal readings.   Testing/Procedures:  Clarence [name]  09/21/2022  You are scheduled for a Cardiac Catheterization on Tuesday, May 14 with Dr. Marca Ancona.  1. Please arrive at the [facility] (Main Entrance A) at [facility]: [address] [place], [place] [address] at 5:30 AM (This time is 2 hour(s) before your procedure to ensure your preparation). Free valet parking service is available. You will check in at ADMITTING. The support person will be asked to wait in the waiting room.  It is OK to have someone drop you off and come back when you are ready to be discharged.    Special note: Every effort is made to have your procedure done on time. Please understand that emergencies sometimes delay scheduled procedures.  2. Diet: Do not eat solid foods after midnight.  The patient may have clear liquids until 5am upon the day of the procedure.   4. Medication instructions in preparation for your procedure:   Contrast Allergy: No  Hold all of your medications the morning of your procedure.     5. Plan to go home the same day, you will only stay overnight if medically necessary. 6. Bring a current list of your medications and current insurance cards. 7. You MUST have a responsible person to drive you home. 8. Someone MUST be with you the first 24 hours after you arrive home or your discharge will be delayed. 9. Please wear clothes that are easy to get on and off and wear slip-on shoes.  Thank you for allowing [place] to care for you!   -- [facility]     Special Instructions // Education:  Do the following things EVERYDAY: Weigh yourself in the  morning before breakfast. Write it down and keep it in a log. Take your medicines as prescribed Eat low salt foods--Limit salt (sodium) to 2000 mg per day.  Stay as active as you can everyday Limit all fluids for the day to less than 2 liters   Follow-Up in: 6 weeks    If you have any questions or concerns before your next appointment please send [place] a message through mychart or call our office at [phone] Monday-Friday 8 am-5 pm.   If you have an urgent need after hours on the weekend please call your Primary Cardiologist or the [facility] in [place] at [phone].

## 2022-09-22 ENCOUNTER — Other Ambulatory Visit: Payer: Self-pay | Admitting: Cardiology

## 2022-09-22 ENCOUNTER — Emergency Department
Admission: EM | Admit: 2022-09-22 | Discharge: 2022-09-22 | Disposition: A | Discharge status: Home / Self Care | Payer: Medicaid Other | Attending: Emergency Medicine | Admitting: Emergency Medicine

## 2022-09-22 ENCOUNTER — Other Ambulatory Visit: Payer: Self-pay

## 2022-09-22 ENCOUNTER — Telehealth: Payer: Self-pay

## 2022-09-22 DIAGNOSIS — I5022 Chronic systolic (congestive) heart failure: Secondary | ICD-10-CM

## 2022-09-22 DIAGNOSIS — T887XXA Unspecified adverse effect of drug or medicament, initial encounter: Secondary | ICD-10-CM

## 2022-09-22 DIAGNOSIS — R42 Dizziness and giddiness: Secondary | ICD-10-CM

## 2022-09-22 LAB — TROPONIN I (HIGH SENSITIVITY): Troponin I (High Sensitivity): 35 ng/L — ABNORMAL HIGH (ref <18)

## 2022-09-22 MED ORDER — SODIUM CHLORIDE 0.9% FLUSH
3.0000 mL | Freq: Two times a day (BID) | INTRAVENOUS | Status: DC
  Filled 2022-09-22: qty 3

## 2022-09-22 NOTE — ED Provider Notes (Signed)
[facility] Provider Note    Event Date/Time   First MD Initiated Contact with Patient 09/22/22 0006     (approximate)   History   Allergic Reaction   HPI  Clarence [name] is a 52 y.o. male who presents to the ED for evaluation of Allergic Reaction   I review a CHF cardiology clinic visit from about 12 hours ago.  Reduced ejection fraction and CKD, CAD, Crohn's disease.  EF 30% --> improved to 45%.  Multivessel CAD.  During this clinic visit he was reporting subacute chest discomfort that was atypical so he was started on Ranexa.  DAPT with Plavix.  Patient presents to the ED for evaluation of various symptoms, now waning, since taking the Ranexa.  He reports a couple weeks of intermittent chest discomfort, for which they started the Ranexa.  About 45 minutes after taking the first dose he reports developing dizziness, blurred vision, numbness sensation throughout his whole body.  He waits nearly 6 hours due to long wait times prior to my evaluation.  Within this timeframe, he reports feeling better.  He reports feeling okay right now and has no complaints.   Physical Exam   Triage Vital Signs: ED Triage Vitals  Enc Vitals Group     BP 09/21/22 1858 123/78     Pulse Rate 09/21/22 1858 62     Resp 09/21/22 1858 18     Temp 09/21/22 1858 98 F (36.7 C)     Temp src --      SpO2 09/21/22 1858 100 %     Weight --      Height --      Head Circumference --      Peak Flow --      Pain Score 09/21/22 1848 4     Pain Loc --      Pain Edu? --      Excl. in GC? --     Most recent vital signs: Vitals:   09/22/22 0100 09/22/22 0200  BP: 133/87 125/85  Pulse: (!) 49 (!) 47  Resp: (!) 24 15  Temp:    SpO2: 97% 99%    General: Awake, no distress.  Well-appearing, pleasant and conversational. CV:  Good peripheral perfusion.  Resp:  Normal effort.  Abd:  No distention.  MSK:  No deformity noted.  Neuro:  No focal deficits appreciated. Cranial  nerves II through XII intact 5/5 strength and sensation in all 4 extremities Other:     ED Results / Procedures / Treatments   Labs (all labs ordered are listed, but only abnormal results are displayed) Labs Reviewed  BASIC METABOLIC PANEL - Abnormal; Notable for the following components:      Result Value   Glucose, Bld 129 (*)    BUN 30 (*)    Creatinine, Ser 1.56 (*)    GFR, Estimated 53 (*)    All other components within normal limits  TROPONIN I (HIGH SENSITIVITY) - Abnormal; Notable for the following components:   Troponin I (High Sensitivity) 36 (*)    All other components within normal limits  TROPONIN I (HIGH SENSITIVITY) - Abnormal; Notable for the following components:   Troponin I (High Sensitivity) 35 (*)    All other components within normal limits  CBC    EKG Sinus rhythm with a rate of 63 bpm, normal axis, nonspecific ST changes inferiorly and laterally without STEMI.  RADIOLOGY CXR interpreted by me without evidence of acute cardiopulmonary pathology.  Official radiology report(s): DG Chest 2 View  Result Date: 09/21/2022 CLINICAL DATA:  Chest tightness EXAM: CHEST - 2 VIEW COMPARISON:  X-ray 09/10/2022 and older FINDINGS: No consolidation, pneumothorax or effusion. No edema. Normal cardiopericardial silhouette. Air-fluid level along the stomach beneath the left hemidiaphragm IMPRESSION: No acute cardiopulmonary disease Electronically Signed   By: Karen Kays M.D.   On: 09/21/2022 19:17    PROCEDURES and INTERVENTIONS:  .1-3 Lead EKG Interpretation  Performed by: Delton Prairie, MD Authorized by: Delton Prairie, MD     Interpretation: normal     ECG rate:  62   ECG rate assessment: normal     Rhythm: sinus rhythm     Ectopy: none     Conduction: normal     Medications - No data to display   IMPRESSION / MDM / ASSESSMENT AND PLAN / ED COURSE  I reviewed the triage vital signs and the nursing notes.  Differential diagnosis includes, but is not  limited to, ACS, medication side effect, anxiety, pancreatitis  {Patient presents with symptoms of an acute illness or injury that is potentially life-threatening.  Patient presents to the ED for the evaluation of waning dizziness and sensorium changes that I suspect is a side effect of recently started medication.  He has reassuring exam with me without evidence of neurologic deficits.  Reports his blurry vision is improving as well as his numbness and dizziness.  Reassuring workup regarding his chest discomforts and he has flat troponins that are chronic.  CKD around baseline.  Normal CBC.  Clear CXR.  I considered observation admission for this patient, we will discharge with close return precautions.  Clinical Course as of 09/22/22 0746  Tue Sep 22, 2022  0202 Reassessed. Doing well. He's been up and ambulatory throughout the ED to the restroom.  We discussed plan of care and I discussed the possibility of admission but he declines.  We discussed expectant management and return precautions [DS]    Clinical Course User Index [DS] Delton Prairie, MD     FINAL CLINICAL IMPRESSION(S) / ED DIAGNOSES   Final diagnoses:  Dizziness  Medication side effect     Rx / DC Orders   ED Discharge Orders     None        Note:  This document was prepared using Dragon voice recognition software and may include unintentional dictation errors.   Delton Prairie, MD 09/22/22 [phone]

## 2022-09-22 NOTE — Discharge Instructions (Signed)
Consider cutting your dose of Ranexa in half  If your symptoms worsen, please return to the ED  Follow-up with your cardiologist regarding your continued symptoms  Consider OTC meclizine/Dramamine that can help with your symptoms of vertigo

## 2022-09-22 NOTE — Transitions of Care (Post Inpatient/ED Visit) (Unsigned)
   09/22/2022  Name: AMARIYON MCBREEN MRN: [id number] DOB: 11/14/1970  Today's TOC FU Call Status:    Attempted to reach the patient regarding the most recent Inpatient/ED visit.  Follow Up Plan: Additional outreach attempts will be made to reach the patient to complete the Transitions of Care (Post Inpatient/ED visit) call.   Signature: [facility]

## 2022-09-23 ENCOUNTER — Telehealth: Payer: Self-pay

## 2022-09-23 NOTE — Transitions of Care (Post Inpatient/ED Visit) (Signed)
   09/23/2022  Name: Clarence [name] MRN: [id number] DOB: 03-06-1971  Today's TOC FU Call Status:    Transition Care Management Follow-up Telephone Call    Items Reviewed:    Medications Reviewed Today: Medications Reviewed Today     Reviewed by Simonne Maffucci, RN (Registered Nurse) on 09/21/22 at 1354  Med List Status: <None>   Medication Order Taking? Sig Documenting Provider Last Dose Status Informant  acetaminophen (TYLENOL) 325 MG tablet [id number] Yes Take 650 mg by mouth at bedtime. [provider] Taking Active Self  aspirin EC 81 MG tablet [id number] Yes Take 1 tablet (81 mg total) by mouth daily. Swallow whole. [place], Anderson Malta, [name] Taking Active Self  atorvastatin (LIPITOR) 80 MG tablet [id number] Yes Take 1 tablet (80 mg total) by mouth every evening. [place], Anderson Malta, [name] Taking Active Self  carvedilol (COREG) 12.5 MG tablet [id number] Yes Take 1 tablet (12.5 mg total) by mouth 2 (two) times daily with a meal. [place], Cristal Deer, MD Taking Active Self  clopidogrel (PLAVIX) 75 MG tablet [id number] Yes Take 1 tablet (75 mg total) by mouth daily. [place], Anderson Malta, [name] Taking Active Self  empagliflozin (JARDIANCE) 10 MG TABS tablet [id number] Yes Take 10 mg by mouth daily. [provider] Taking Active Self  ezetimibe (ZETIA) 10 MG tablet [id number] Yes Take 1 tablet (10 mg total) by mouth daily. Sondra Barges, PA-C Taking Active Self  famotidine (PEPCID) 10 MG tablet [id number] Yes Take 10 mg by mouth at bedtime. As needed [provider] Taking Active Self  fenofibrate (TRICOR) 48 MG tablet [id number] Yes Take 1 tablet (48 mg total) by mouth daily. [place], Anderson Malta, [name] Taking Active Self  furosemide (LASIX) 20 MG tablet [id number] Yes Take 20 mg by mouth daily as needed. For weight gain of 3 lb in 24 hours or 5 lbs in one week [provider] Taking Active Self  isosorbide dinitrate (ISORDIL) 30 MG tablet [id number] No Take 30 mg by mouth daily.   Patient not taking: Reported on 09/21/2022   [provider] Not Taking Active   isosorbide mononitrate (IMDUR) 30 MG 24 hr tablet [id number] Yes Take 30 mg by mouth daily. One tablet daily by mouth. [provider] Taking Active   nitroGLYCERIN (NITROSTAT) 0.4 MG SL tablet [id number] No Place 1 tablet (0.4 mg total) under the tongue every 5 (five) minutes as needed for chest pain.  Patient not taking: Reported on 09/21/2022   Ngetich, Donalee Citrin, NP Not Taking Active Self  ondansetron (ZOFRAN) 4 MG tablet [id number] Yes Take 1 tablet (4 mg total) by mouth every 8 (eight) hours as needed for nausea or vomiting. Ngetich, Dinah C, NP Taking Active   sacubitril-valsartan (ENTRESTO) 24-26 MG [id number] Yes Take 1 tablet by mouth 2 (two) times daily. Lurene Shadow, MD Taking Active   spironolactone (ALDACTONE) 25 MG tablet [id number] Yes Take 0.5 tablets (12.5 mg total) by mouth daily. Ngetich, Donalee Citrin, NP Taking Active             Home Care and Equipment/Supplies:    Functional Questionnaire:    Follow up appointments reviewed:      SIGNATURE: [facility]

## 2022-09-23 NOTE — Transitions of Care (Post Inpatient/ED Visit) (Signed)
09/23/2022  Name: Clarence [name] MRN: [id number] DOB: Jul 01, 1970  Today's TOC FU Call Status: Today's TOC FU Call Status:: Successful TOC FU Call Competed Unsuccessful Call (1st Attempt) Date: 09/22/22 [facility] FU Call Complete Date: 09/23/22  Transition Care Management Follow-up Telephone Call Date of Discharge: 09/22/22 Discharge Facility: [facility]) Type of Discharge: Emergency Department Primary Inpatient Discharge Diagnosis:: allergic reaction to medication Reason for ED Visit: Medication Changes, Other: How have you been since you were released from the hospital?: Better Any questions or concerns?: No Patient Questions/Concerns:: will follow up with primary  Items Reviewed: Did you receive and understand the discharge instructions provided?: Yes Medications obtained,verified, and reconciled?: Yes (Medications Reviewed) Any new allergies since your discharge?: Yes Dietary orders reviewed?: No Do you have support at home?: Yes People in Home: alone  Medications Reviewed Today: Medications Reviewed Today     Reviewed by Jonni Sanger, CMA (Certified Medical Assistant) on 09/23/22 at [phone]  Med List Status: <None>   Medication Order Taking? Sig Documenting Provider Last Dose Status Informant  acetaminophen (TYLENOL) 325 MG tablet [id number] Yes Take 650 mg by mouth at bedtime. [provider] Taking Active Self  aspirin EC 81 MG tablet [id number] Yes Take 1 tablet (81 mg total) by mouth daily. Swallow whole. [place], Anderson Malta, [name] Taking Active Self  atorvastatin (LIPITOR) 80 MG tablet [id number] Yes Take 1 tablet (80 mg total) by mouth every evening. [place], Anderson Malta, [name] Taking Active Self  carvedilol (COREG) 6.25 MG tablet [id number] Yes Take 1 tablet (6.25 mg total) by mouth 2 (two) times daily. Laurey Morale, MD Taking Active   clopidogrel (PLAVIX) 75 MG tablet [id number] Yes Take 1 tablet (75 mg total) by mouth daily. [place],  Anderson Malta, [name] Taking Active Self  empagliflozin (JARDIANCE) 10 MG TABS tablet [id number] Yes Take 10 mg by mouth daily. [provider] Taking Active Self  ezetimibe (ZETIA) 10 MG tablet [id number] Yes Take 1 tablet (10 mg total) by mouth daily. Sondra Barges, PA-C Taking Active Self  famotidine (PEPCID) 10 MG tablet [id number] Yes Take 10 mg by mouth at bedtime. As needed [provider] Taking Active Self  fenofibrate (TRICOR) 48 MG tablet [id number] Yes Take 1 tablet (48 mg total) by mouth daily. [place], Anderson Malta, [name] Taking Active Self  furosemide (LASIX) 20 MG tablet [id number] Yes Take 20 mg by mouth daily as needed. For weight gain of 3 lb in 24 hours or 5 lbs in one week [provider] Taking Active Self  isosorbide dinitrate (ISORDIL) 30 MG tablet [id number] Yes Take 30 mg by mouth daily. [provider] Taking Active   isosorbide mononitrate (IMDUR) 30 MG 24 hr tablet [id number] Yes Take 30 mg by mouth daily. One tablet daily by mouth. [provider] Taking Active   nitroGLYCERIN (NITROSTAT) 0.4 MG SL tablet [id number] Yes Place 1 tablet (0.4 mg total) under the tongue every 5 (five) minutes as needed for chest pain. Ngetich, Donalee Citrin, NP Taking Active Self  ondansetron (ZOFRAN) 4 MG tablet [id number] Yes Take 1 tablet (4 mg total) by mouth every 8 (eight) hours as needed for nausea or vomiting. Ngetich, Dinah C, NP Taking Active   ranolazine (RANEXA) 500 MG 12 hr tablet [id number] No Take 1 tablet (500 mg total) by mouth 2 (two) times daily.  Patient not taking: Reported on 09/23/2022   Laurey Morale, MD Not Taking Active   sacubitril-valsartan [facility]) 24-26 [place] [id number] Yes Take  1 tablet by mouth 2 (two) times daily. Lurene Shadow, MD Taking Active   sodium chloride flush (NS) 0.9 % injection 3 mL [id number]   Laurey Morale, MD  Active   spironolactone (ALDACTONE) 25 MG tablet [id number] Yes Take 0.5 tablets (12.5 mg total) by mouth daily.  Ngetich, Donalee Citrin, NP Taking Active             Home Care and Equipment/Supplies: Were Home Health Services Ordered?: NA Any new equipment or medical supplies ordered?: NA  Functional Questionnaire: Do you need assistance with bathing/showering or dressing?: No Do you need assistance with meal preparation?: No Do you need assistance with eating?: No Do you have difficulty maintaining continence: No Do you need assistance with getting out of bed/getting out of a chair/moving?: No Do you have difficulty managing or taking your medications?: No  Follow up appointments reviewed: PCP Follow-up appointment confirmed?: Yes Date of PCP follow-up appointment?: 10/02/22 Follow-up Provider: Richarda Blade, NP [facility]ist [facility] Follow-up appointment confirmed?: No Do you need transportation to your follow-up appointment?: No Do you understand care options if your condition(s) worsen?: No-reviewed care options from [facility]    SIGNATURE: [facility]

## 2022-09-24 ENCOUNTER — Encounter: Payer: Medicaid Other | Admitting: Cardiology

## 2022-09-29 ENCOUNTER — Observation Stay (HOSPITAL_COMMUNITY)
Admission: RE | Admit: 2022-09-29 | Discharge: 2022-09-30 | Disposition: A | Discharge status: Home / Self Care | Payer: Medicaid Other | Attending: Cardiology | Admitting: Cardiology

## 2022-09-29 ENCOUNTER — Other Ambulatory Visit: Payer: Self-pay

## 2022-09-29 ENCOUNTER — Encounter (HOSPITAL_COMMUNITY)
Admission: RE | Disposition: A | Discharge status: Home / Self Care | Payer: Self-pay | Source: Home / Self Care | Attending: Cardiology

## 2022-09-29 ENCOUNTER — Encounter (HOSPITAL_COMMUNITY): Payer: Self-pay | Admitting: Cardiology

## 2022-09-29 DIAGNOSIS — Z7982 Long term (current) use of aspirin: Secondary | ICD-10-CM | POA: Diagnosis not present

## 2022-09-29 DIAGNOSIS — I130 Hypertensive heart and chronic kidney disease with heart failure and stage 1 through stage 4 chronic kidney disease, or unspecified chronic kidney disease: Secondary | ICD-10-CM | POA: Insufficient documentation

## 2022-09-29 DIAGNOSIS — Z79899 Other long term (current) drug therapy: Secondary | ICD-10-CM | POA: Insufficient documentation

## 2022-09-29 DIAGNOSIS — I5022 Chronic systolic (congestive) heart failure: Secondary | ICD-10-CM | POA: Diagnosis not present

## 2022-09-29 DIAGNOSIS — I2510 Atherosclerotic heart disease of native coronary artery without angina pectoris: Secondary | ICD-10-CM | POA: Diagnosis not present

## 2022-09-29 DIAGNOSIS — N183 Chronic kidney disease, stage 3 unspecified: Secondary | ICD-10-CM | POA: Diagnosis not present

## 2022-09-29 DIAGNOSIS — Z7902 Long term (current) use of antithrombotics/antiplatelets: Secondary | ICD-10-CM | POA: Insufficient documentation

## 2022-09-29 DIAGNOSIS — Z87891 Personal history of nicotine dependence: Secondary | ICD-10-CM | POA: Diagnosis not present

## 2022-09-29 DIAGNOSIS — I509 Heart failure, unspecified: Secondary | ICD-10-CM

## 2022-09-29 HISTORY — PX: RIGHT/LEFT HEART CATH AND CORONARY ANGIOGRAPHY: CATH118266

## 2022-09-29 LAB — POCT I-STAT EG7
Acid-base deficit: 3 mmol/L — ABNORMAL HIGH (ref 0.0–2.0)
Acid-base deficit: 3 mmol/L — ABNORMAL HIGH (ref 0.0–2.0)
Bicarbonate: 22.9 mmol/L (ref 20.0–28.0)
Bicarbonate: 23.5 mmol/L (ref 20.0–28.0)
Calcium, Ion: 1.31 mmol/L (ref 1.15–1.40)
Calcium, Ion: 1.33 mmol/L (ref 1.15–1.40)
HCT: 41 % (ref 39.0–52.0)
HCT: 41 % (ref 39.0–52.0)
Hemoglobin: 13.9 g/dL (ref 13.0–17.0)
Hemoglobin: 13.9 g/dL (ref 13.0–17.0)
O2 Saturation: 68 %
O2 Saturation: 71 %
Potassium: 3.9 mmol/L (ref 3.5–5.1)
Potassium: 3.9 mmol/L (ref 3.5–5.1)
Sodium: 141 mmol/L (ref 135–145)
Sodium: 141 mmol/L (ref 135–145)
TCO2: 24 mmol/L (ref 22–32)
TCO2: 25 mmol/L (ref 22–32)
pCO2, Ven: 44.7 mmHg (ref 44–60)
pCO2, Ven: 45.2 mmHg (ref 44–60)
pH, Ven: 7.317 (ref 7.25–7.43)
pH, Ven: 7.324 (ref 7.25–7.43)
pO2, Ven: 38 mmHg (ref 32–45)
pO2, Ven: 40 mmHg (ref 32–45)

## 2022-09-29 LAB — CBC
HCT: 39.7 % (ref 39.0–52.0)
Hemoglobin: 13.6 g/dL (ref 13.0–17.0)
MCH: 28 pg (ref 26.0–34.0)
MCHC: 34.3 g/dL (ref 30.0–36.0)
MCV: 81.7 fL (ref 80.0–100.0)
Platelets: 160 10*3/uL (ref 150–400)
RBC: 4.86 MIL/uL (ref 4.22–5.81)
RDW: 13.5 % (ref 11.5–15.5)
WBC: 5.7 10*3/uL (ref 4.0–10.5)
nRBC: 0 % (ref 0.0–0.2)

## 2022-09-29 LAB — CREATININE, SERUM
Creatinine, Ser: 1.56 mg/dL — ABNORMAL HIGH (ref 0.61–1.24)
GFR, Estimated: 53 mL/min — ABNORMAL LOW (ref >60)

## 2022-09-29 SURGERY — RIGHT/LEFT HEART CATH AND CORONARY ANGIOGRAPHY
Anesthesia: LOCAL

## 2022-09-29 MED ORDER — SODIUM CHLORIDE 0.9 % IV SOLN: INTRAVENOUS | Status: DC

## 2022-09-29 MED ORDER — ISOSORBIDE MONONITRATE ER 30 MG PO TB24
30.0000 mg | ORAL_TABLET | Freq: Every day | ORAL | Status: DC
  Administered 2022-09-29 – 2022-09-30 (×2): 30 mg via ORAL
  Filled 2022-09-29 (×2): qty 1

## 2022-09-29 MED ORDER — LIDOCAINE HCL (PF) 1 % IJ SOLN
INTRAMUSCULAR | Status: AC
  Filled 2022-09-29: qty 30

## 2022-09-29 MED ORDER — EMPAGLIFLOZIN 10 MG PO TABS
10.0000 mg | ORAL_TABLET | Freq: Every day | ORAL | Status: DC
  Administered 2022-09-29 – 2022-09-30 (×2): 10 mg via ORAL
  Filled 2022-09-29 (×2): qty 1

## 2022-09-29 MED ORDER — MIDAZOLAM HCL 2 MG/2ML IJ SOLN
INTRAMUSCULAR | Status: AC
  Filled 2022-09-29: qty 2

## 2022-09-29 MED ORDER — SODIUM CHLORIDE 0.9% FLUSH
3.0000 mL | Freq: Two times a day (BID) | INTRAVENOUS | Status: DC
  Administered 2022-09-29 – 2022-09-30 (×2): 3 mL via INTRAVENOUS

## 2022-09-29 MED ORDER — ASPIRIN 81 MG PO CHEW
81.0000 mg | CHEWABLE_TABLET | Freq: Once | ORAL | Status: AC
  Administered 2022-09-29: 81 mg via ORAL
  Filled 2022-09-29: qty 1

## 2022-09-29 MED ORDER — ACETAMINOPHEN 325 MG PO TABS
650.0000 mg | ORAL_TABLET | Freq: Every day | ORAL | Status: DC
  Administered 2022-09-29: 650 mg via ORAL
  Filled 2022-09-29: qty 2

## 2022-09-29 MED ORDER — SODIUM CHLORIDE 0.9% FLUSH: 3.0000 mL | INTRAVENOUS | Status: DC | PRN

## 2022-09-29 MED ORDER — SODIUM CHLORIDE 0.9 % IV SOLN: 250.0000 mL | INTRAVENOUS | Status: DC | PRN

## 2022-09-29 MED ORDER — ONDANSETRON HCL 4 MG PO TABS: 4.0000 mg | ORAL_TABLET | Freq: Three times a day (TID) | ORAL | Status: DC | PRN

## 2022-09-29 MED ORDER — CLOPIDOGREL BISULFATE 75 MG PO TABS
75.0000 mg | ORAL_TABLET | Freq: Once | ORAL | Status: AC
  Administered 2022-09-29: 75 mg via ORAL
  Filled 2022-09-29: qty 1

## 2022-09-29 MED ORDER — ASPIRIN 81 MG PO TBEC
81.0000 mg | DELAYED_RELEASE_TABLET | Freq: Every day | ORAL | Status: DC
  Administered 2022-09-29 – 2022-09-30 (×2): 81 mg via ORAL
  Filled 2022-09-29 (×2): qty 1

## 2022-09-29 MED ORDER — MIDAZOLAM HCL 2 MG/2ML IJ SOLN
INTRAMUSCULAR | Status: DC | PRN
  Administered 2022-09-29: 1 mg via INTRAVENOUS

## 2022-09-29 MED ORDER — HEPARIN SODIUM (PORCINE) 1000 UNIT/ML IJ SOLN
INTRAMUSCULAR | Status: DC | PRN
  Administered 2022-09-29: 5000 [IU] via INTRAVENOUS

## 2022-09-29 MED ORDER — FAMOTIDINE 20 MG PO TABS
10.0000 mg | ORAL_TABLET | Freq: Every day | ORAL | Status: DC
  Filled 2022-09-29: qty 1

## 2022-09-29 MED ORDER — FENTANYL CITRATE (PF) 100 MCG/2ML IJ SOLN
INTRAMUSCULAR | Status: AC
  Filled 2022-09-29: qty 2

## 2022-09-29 MED ORDER — FENTANYL CITRATE (PF) 100 MCG/2ML IJ SOLN
INTRAMUSCULAR | Status: DC | PRN
  Administered 2022-09-29: 25 ug via INTRAVENOUS

## 2022-09-29 MED ORDER — LIDOCAINE HCL (PF) 1 % IJ SOLN
INTRAMUSCULAR | Status: DC | PRN
  Administered 2022-09-29 (×2): 2 mL

## 2022-09-29 MED ORDER — EZETIMIBE 10 MG PO TABS
10.0000 mg | ORAL_TABLET | Freq: Every day | ORAL | Status: DC
  Administered 2022-09-29 – 2022-09-30 (×2): 10 mg via ORAL
  Filled 2022-09-29 (×2): qty 1

## 2022-09-29 MED ORDER — ENOXAPARIN SODIUM 40 MG/0.4ML IJ SOSY
40.0000 mg | PREFILLED_SYRINGE | INTRAMUSCULAR | Status: DC
  Filled 2022-09-29: qty 0.4

## 2022-09-29 MED ORDER — ONDANSETRON HCL 4 MG/2ML IJ SOLN: 4.0000 mg | Freq: Four times a day (QID) | INTRAMUSCULAR | Status: DC | PRN

## 2022-09-29 MED ORDER — ATORVASTATIN CALCIUM 80 MG PO TABS
80.0000 mg | ORAL_TABLET | Freq: Every evening | ORAL | Status: DC
  Administered 2022-09-29: 80 mg via ORAL
  Filled 2022-09-29: qty 1

## 2022-09-29 MED ORDER — LABETALOL HCL 5 MG/ML IV SOLN: 10.0000 mg | INTRAVENOUS | Status: AC | PRN

## 2022-09-29 MED ORDER — VERAPAMIL HCL 2.5 MG/ML IV SOLN
INTRAVENOUS | Status: AC
  Filled 2022-09-29: qty 2

## 2022-09-29 MED ORDER — HYDRALAZINE HCL 20 MG/ML IJ SOLN: 10.0000 mg | INTRAMUSCULAR | Status: AC | PRN

## 2022-09-29 MED ORDER — CARVEDILOL 6.25 MG PO TABS
6.2500 mg | ORAL_TABLET | Freq: Two times a day (BID) | ORAL | Status: DC
  Administered 2022-09-29 – 2022-09-30 (×2): 6.25 mg via ORAL
  Filled 2022-09-29 (×2): qty 1

## 2022-09-29 MED ORDER — ACETAMINOPHEN 325 MG PO TABS: 650.0000 mg | ORAL_TABLET | ORAL | Status: DC | PRN

## 2022-09-29 MED ORDER — SPIRONOLACTONE 12.5 MG HALF TABLET
12.5000 mg | ORAL_TABLET | Freq: Every day | ORAL | Status: DC
  Administered 2022-09-29 – 2022-09-30 (×2): 12.5 mg via ORAL
  Filled 2022-09-29 (×2): qty 1

## 2022-09-29 MED ORDER — VERAPAMIL HCL 2.5 MG/ML IV SOLN
INTRAVENOUS | Status: DC | PRN
  Administered 2022-09-29: 10 mL via INTRA_ARTERIAL

## 2022-09-29 MED ORDER — SODIUM CHLORIDE 0.9 % IV SOLN
INTRAVENOUS | Status: AC
  Administered 2022-09-29: 75 mL/h via INTRAVENOUS

## 2022-09-29 MED ORDER — NITROGLYCERIN 0.4 MG SL SUBL: 0.4000 mg | SUBLINGUAL_TABLET | SUBLINGUAL | Status: DC | PRN

## 2022-09-29 MED ORDER — SACUBITRIL-VALSARTAN 24-26 MG PO TABS
1.0000 | ORAL_TABLET | Freq: Two times a day (BID) | ORAL | Status: DC
  Administered 2022-09-29 – 2022-09-30 (×2): 1 via ORAL
  Filled 2022-09-29 (×2): qty 1

## 2022-09-29 MED ORDER — CLOPIDOGREL BISULFATE 75 MG PO TABS
75.0000 mg | ORAL_TABLET | Freq: Every day | ORAL | Status: DC
  Administered 2022-09-29 – 2022-09-30 (×2): 75 mg via ORAL
  Filled 2022-09-29 (×2): qty 1

## 2022-09-29 MED ORDER — HEPARIN SODIUM (PORCINE) 1000 UNIT/ML IJ SOLN
INTRAMUSCULAR | Status: AC
  Filled 2022-09-29: qty 10

## 2022-09-29 MED ORDER — IOHEXOL 350 MG/ML SOLN
INTRAVENOUS | Status: DC | PRN
  Administered 2022-09-29: 30 mL via INTRA_ARTERIAL

## 2022-09-29 MED ORDER — HEPARIN (PORCINE) IN NACL 1000-0.9 UT/500ML-% IV SOLN
INTRAVENOUS | Status: DC | PRN
  Administered 2022-09-29 (×2): 500 mL

## 2022-09-29 SURGICAL SUPPLY — 10 items
CATH 5FR JL3.5 JR4 ANG PIG MP (CATHETERS) IMPLANT
CATH BALLN WEDGE 5F 110CM (CATHETERS) IMPLANT
DEVICE RAD COMP TR BAND LRG (VASCULAR PRODUCTS) IMPLANT
GLIDESHEATH SLEND SS 6F .021 (SHEATH) IMPLANT
GUIDEWIRE INQWIRE 1.5J.035X260 (WIRE) IMPLANT
INQWIRE 1.5J .035X260CM (WIRE) ×1
KIT HEART LEFT (KITS) ×1 IMPLANT
PACK CARDIAC CATHETERIZATION (CUSTOM PROCEDURE TRAY) ×1 IMPLANT
SHEATH GLIDE SLENDER 4/5FR (SHEATH) IMPLANT
TRANSDUCER W/STOPCOCK (MISCELLANEOUS) ×1 IMPLANT

## 2022-09-29 NOTE — Interval H&P Note (Signed)
History and Physical Interval Note:  09/29/2022 7:54 AM  Clarence [name]  has presented today for surgery, with the diagnosis of heart failure.  The various methods of treatment have been discussed with the patient and family. After consideration of risks, benefits and other options for treatment, the patient has consented to  Procedure(s): RIGHT/LEFT HEART CATH AND CORONARY ANGIOGRAPHY (N/A) as a surgical intervention.  The patient's history has been reviewed, patient examined, no change in status, stable for surgery.  I have reviewed the patient's chart and labs.  Questions were answered to the patient's satisfaction.     Griselda Tosh [employer]

## 2022-09-29 NOTE — Discharge Summary (Incomplete)
Advanced Heart Failure Team  Discharge Summary   Patient ID: Clarence [name] MRN: [id number], DOB/AGE: 1970-07-10 52 y.o. Admit date: 09/29/2022 D/C date:     09/30/2022   Primary Discharge Diagnoses:  1. Chronic HFrEF 2. CAD 3. CKD Stage III Hospital Course:  52 y.o. with history of CAD, chronic systolic CHF, Crohns disease, CKD stage 3, and HTN     Patient was admitted in 3/23 with newly found cardiomyopathy, CHF and atypical PNA.  Echo in 3/23 showed EF 30-35%, moderate LVH, normal RV. LHC showed occluded RCA with collaterals, 60% mLAD, 65% mLCx, 90% mid OM1.  RHC showed elevated filling pressures and low CI (2).  Patient was admitted again in 4/23 with CHF. He was admitted in 5/23 with hypertensive urgency and chest pain, CTA abdomen/pelvis did not show renal artery stenosis.   He was admitted again in 6/23 with hypertensive urgency.  He quit smoking after 3/23 admission.  Rarely drinks ETOH and no drugs.  No strong family history of CAD or cardiomyopathy.    He was admitted in 4/24 with chest pain => no evidence for ACS.  He had severe watery diarrhea via his ostomy and was dehydrated. Meds were held. Echo in 4/24 was improved, showing EF 45-50%, inferior and inferolateral hypokinesis, normal RV.   He was seen in the clinic 09/21/22 with chest pain. He was set up for cath.   Admitted from cath lab for observation because he did not have transportation home.  Cath showed unchanged coronary disease and normal filling pressures.   Discharging home on the same HF medications. Renal function remained stable. No changes. Will set up follow up with Dr Shirlee Latch at [facility].    Discharge Vitals: Blood pressure 133/87, pulse (!) 56, temperature 98.5 F (36.9 C), temperature source Oral, resp. rate 18, height 5\' 10"  (1.778 m), weight 101.2 kg, SpO2 98 %.  General:  Well appearing. No resp difficulty HEENT: normal Neck: supple. no JVD. Carotids 2+ bilat; no bruits. No lymphadenopathy or thryomegaly  appreciated. Cor: PMI nondisplaced. Regular rate & rhythm. No rubs, gallops or murmurs. Lungs: clear Abdomen: soft, nontender, nondistended. No hepatosplenomegaly. No bruits or masses. Good bowel sounds. Extremities: no cyanosis, clubbing, rash, edema Neuro: alert & orientedx3, cranial nerves grossly intact. moves all 4 extremities w/o difficulty. Affect pleasant   Labs: Lab Results  Component Value Date   WBC 7.0 09/30/2022   HGB 13.5 09/30/2022   HCT 40.3 09/30/2022   MCV 81.7 09/30/2022   PLT 175 09/30/2022    Recent Labs  Lab 09/30/22 0102  NA 139  K 3.8  CL 109  CO2 23  BUN 18  CREATININE 1.56*  CALCIUM 9.0  GLUCOSE 111*   Lab Results  Component Value Date   CHOL 125 09/21/2022   HDL 33 (L) 09/21/2022   LDLCALC 59 09/21/2022   TRIG 165 (H) 09/21/2022   BNP (last 3 results) Recent Labs    11/11/21 1422 04/29/22 1600 09/21/22 1555  BNP 111.9* 171.4* 95.8    ProBNP (last 3 results) No results for input(s): "PROBNP" in the last 8760 hours.   Diagnostic Studies/Procedures   CARDIAC CATHETERIZATION  Result Date: 09/29/2022   Mid RCA lesion is 100% stenosed.   2nd Diag lesion is 40% stenosed.   1st Mrg lesion is 90% stenosed.   Mid LAD lesion is 50% stenosed.   Prox Cx to Mid Cx lesion is 60% stenosed. 1. Normal filling pressures. 2. Preserved cardiac output. 3. Coronaries unchanged from  prior study.  Has CTO RCA with collaterals.  There is severe, diffuse disease of OM1 with nonobstructive disease in the LAD and LCx.  Continue medical management.    Discharge Medications   Allergies as of 09/30/2022       Reactions   Ibuprofen Other (See Comments)   History of GIB with ibuprofen    Sulfacetamide Other (See Comments)   Neomycin-bacitracin Zn-polymyx    Other reaction(s): Other (See Comments)   Spironolactone    Hyperkalemia        Medication List     STOP taking these medications    isosorbide dinitrate 30 MG tablet Commonly known as:  ISORDIL   ranolazine 500 MG 12 hr tablet Commonly known as: RANEXA       TAKE these medications    acetaminophen 325 MG tablet Commonly known as: TYLENOL Take 650 mg by mouth at bedtime.   Aspirin Low Dose 81 MG tablet Generic drug: aspirin EC Take 1 tablet (81 mg total) by mouth daily. Swallow whole.   atorvastatin 80 MG tablet Commonly known as: LIPITOR Take 1 tablet (80 mg total) by mouth every evening.   carvedilol 6.25 MG tablet Commonly known as: COREG Take 1 tablet (6.25 mg total) by mouth 2 (two) times daily.   clopidogrel 75 MG tablet Commonly known as: PLAVIX Take 1 tablet (75 mg total) by mouth daily.   Entresto 24-26 MG Generic drug: sacubitril-valsartan Take 1 tablet by mouth 2 (two) times daily.   ezetimibe 10 MG tablet Commonly known as: ZETIA Take 1 tablet (10 mg total) by mouth daily.   famotidine 10 MG tablet Commonly known as: PEPCID Take 10 mg by mouth at bedtime. As needed   fenofibrate 48 MG tablet Commonly known as: Tricor Take 1 tablet (48 mg total) by mouth daily.   furosemide 20 MG tablet Commonly known as: LASIX Take 20 mg by mouth daily as needed. For weight gain of 3 lb in 24 hours or 5 lbs in one week   isosorbide mononitrate 30 MG 24 hr tablet Commonly known as: IMDUR Take 30 mg by mouth daily. One tablet daily by mouth.   Jardiance 10 MG Tabs tablet Generic drug: empagliflozin Take 10 mg by mouth daily.   nitroGLYCERIN 0.4 MG SL tablet Commonly known as: Nitrostat Place 1 tablet (0.4 mg total) under the tongue every 5 (five) minutes as needed for chest pain.   ondansetron 4 MG tablet Commonly known as: ZOFRAN Take 1 tablet (4 mg total) by mouth every 8 (eight) hours as needed for nausea or vomiting.   spironolactone 25 MG tablet Commonly known as: ALDACTONE Take 0.5 tablets (12.5 mg total) by mouth daily.        Disposition   The patient will be discharged in stable condition to home. Discharge Instructions      (HEART FAILURE PATIENTS) Call MD:  Anytime you have any of the following symptoms: 1) 3 pound weight gain in 24 hours or 5 pounds in 1 week 2) shortness of breath, with or without a dry hacking cough 3) swelling in the hands, feet or stomach 4) if you have to sleep on extra pillows at night in order to breathe.   Complete by: As directed    Diet - low sodium heart healthy   Complete by: As directed    Heart Failure patients record your daily weight using the same scale at the same time of day   Complete by: As directed  Increase activity slowly   Complete by: As directed           Duration of Discharge Encounter: Greater than 35 minutes   Signed, Tasheika Kitzmiller Np-C  09/30/2022, 9:01 AM  Patient seen with NP, agree with the above note.   Stable today, no complaints.  He has not been able to tolerate ranolazine due to side effects, we will leave him off this.  Otherwise, no medication changes. Well-compensated RHC and unchanged coronaries.   Marca Ancona 09/30/2022 10:01 AM

## 2022-09-30 ENCOUNTER — Ambulatory Visit: Payer: Medicaid Other | Admitting: Nurse Practitioner

## 2022-09-30 ENCOUNTER — Encounter: Payer: Self-pay | Admitting: *Deleted

## 2022-09-30 ENCOUNTER — Telehealth: Payer: Self-pay

## 2022-09-30 ENCOUNTER — Encounter (HOSPITAL_COMMUNITY): Payer: Self-pay | Admitting: Cardiology

## 2022-09-30 DIAGNOSIS — I5042 Chronic combined systolic (congestive) and diastolic (congestive) heart failure: Secondary | ICD-10-CM

## 2022-09-30 DIAGNOSIS — I2510 Atherosclerotic heart disease of native coronary artery without angina pectoris: Secondary | ICD-10-CM | POA: Diagnosis not present

## 2022-09-30 DIAGNOSIS — I5022 Chronic systolic (congestive) heart failure: Secondary | ICD-10-CM | POA: Diagnosis not present

## 2022-09-30 LAB — BASIC METABOLIC PANEL
Anion gap: 7 (ref 5–15)
BUN: 18 mg/dL (ref 6–20)
CO2: 23 mmol/L (ref 22–32)
Calcium: 9 mg/dL (ref 8.9–10.3)
Chloride: 109 mmol/L (ref 98–111)
Creatinine, Ser: 1.56 mg/dL — ABNORMAL HIGH (ref 0.61–1.24)
GFR, Estimated: 53 mL/min — ABNORMAL LOW (ref >60)
Glucose, Bld: 111 mg/dL — ABNORMAL HIGH (ref 70–99)
Potassium: 3.8 mmol/L (ref 3.5–5.1)
Sodium: 139 mmol/L (ref 135–145)

## 2022-09-30 LAB — CBC
HCT: 40.3 % (ref 39.0–52.0)
Hemoglobin: 13.5 g/dL (ref 13.0–17.0)
MCH: 27.4 pg (ref 26.0–34.0)
MCHC: 33.5 g/dL (ref 30.0–36.0)
MCV: 81.7 fL (ref 80.0–100.0)
Platelets: 175 10*3/uL (ref 150–400)
RBC: 4.93 MIL/uL (ref 4.22–5.81)
RDW: 13.7 % (ref 11.5–15.5)
WBC: 7 10*3/uL (ref 4.0–10.5)
nRBC: 0 % (ref 0.0–0.2)

## 2022-09-30 NOTE — Telephone Encounter (Signed)
Called patient to follow up on medical hold with pulmonary rehab. They are still adjusting some of his medications and he is due to see his cardiologist today, 5/15 for a follow up appointment with Dr. Brion Aliment. Patient will discuss with him on what they want to do and if he is appropriate to return to rehab. Patient is aware he needs to be feeling good and needs to receive clearance to return to rehab. Patient verbalized understanding. Remain patient on medical hold until further notice.

## 2022-09-30 NOTE — Progress Notes (Signed)
Patient was discharged to home.  He received written and verbal discharge instructions. He verbalize understanding. He has a follow up appoint with Dr. Okey Dupre at 2 pm.

## 2022-09-30 NOTE — Progress Notes (Signed)
Pulmonary Individual Treatment Plan  Patient Details  Name: Clarence [name] MRN: [id number] Date of Birth: July 24, 1970 Referring Provider:   Flowsheet Row Pulmonary Rehab from 07/30/2022 in [facility] Cardiac and Pulmonary Rehab  Referring Provider Dr. Yvonne Kendall, MD       Initial Encounter Date:  Flowsheet Row Pulmonary Rehab from 07/30/2022 in [facility] Cardiac and Pulmonary Rehab  Date 07/30/22       Visit Diagnosis: Chronic combined systolic and diastolic congestive heart failure (HCC)  Patient's Home Medications on Admission: No current facility-administered medications for this visit. No current outpatient medications on file.  Facility-Administered Medications Ordered in Other Visits:    0.9 %  sodium chloride infusion, 250 mL, Intravenous, PRN, Laurey Morale, MD   acetaminophen (TYLENOL) tablet 650 mg, 650 mg, Oral, Q4H PRN, Laurey Morale, MD   acetaminophen (TYLENOL) tablet 650 mg, 650 mg, Oral, QHS, Clegg, Amy D, NP, 650 mg at 09/29/22 2146   aspirin EC tablet 81 mg, 81 mg, Oral, Daily, Clegg, Amy D, NP, 81 mg at 09/29/22 1547   atorvastatin (LIPITOR) tablet 80 mg, 80 mg, Oral, QPM, Clegg, Amy D, NP, 80 mg at 09/29/22 1547   carvedilol (COREG) tablet 6.25 mg, 6.25 mg, Oral, BID, Clegg, Amy D, NP, 6.25 mg at 09/29/22 2145   clopidogrel (PLAVIX) tablet 75 mg, 75 mg, Oral, Daily, Clegg, Amy D, NP, 75 mg at 09/29/22 1547   empagliflozin (JARDIANCE) tablet 10 mg, 10 mg, Oral, Daily, Clegg, Amy D, NP, 10 mg at 09/29/22 1546   enoxaparin (LOVENOX) injection 40 mg, 40 mg, Subcutaneous, Q24H, Laurey Morale, MD   ezetimibe (ZETIA) tablet 10 mg, 10 mg, Oral, Daily, Clegg, Amy D, NP, 10 mg at 09/29/22 1547   famotidine (PEPCID) tablet 10 mg, 10 mg, Oral, QHS, Clegg, Amy D, NP   isosorbide mononitrate (IMDUR) 24 hr tablet 30 mg, 30 mg, Oral, Daily, Clegg, Amy D, NP, 30 mg at 09/29/22 1546   nitroGLYCERIN (NITROSTAT) SL tablet 0.4 mg, 0.4 mg, Sublingual, Q5 min PRN, Clegg, Amy D,  NP   ondansetron (ZOFRAN) injection 4 mg, 4 mg, Intravenous, Q6H PRN, Laurey Morale, MD   ondansetron [facility]) tablet 4 mg, 4 mg, Oral, Q8H PRN, Clegg, Amy D, NP   sacubitril-valsartan (ENTRESTO) 24-26 mg per tablet, 1 tablet, Oral, BID, Clegg, Amy D, NP, 1 tablet at 09/29/22 2146   sodium chloride flush (NS) 0.9 % injection 3 mL, 3 mL, Intravenous, Q12H, Laurey Morale, MD, 3 mL at 09/29/22 1945   sodium chloride flush (NS) 0.9 % injection 3 mL, 3 mL, Intravenous, PRN, Laurey Morale, MD   spironolactone (ALDACTONE) tablet 12.5 mg, 12.5 mg, Oral, Daily, Clegg, Amy D, NP, 12.5 mg at 09/29/22 1546  Past Medical History: Past Medical History:  Diagnosis Date   Ascending Aortic Dilation    a. 09/2021 Echo: Asc Ao 40mm.   CAD (coronary artery disease)    a. 07/2021 Cath: LM nl, LAD 52m, D2 40, LCX 65p/m, OM1 90, RCA 143m w/ L-.R collats to RPDA from septal 1/2-->Med rx.   Chronic HFrEF (heart failure with reduced ejection fraction) (HCC)    a. 07/2021 Echo: EF 30-35%, glob HK, mod LVH, GrII DD, nl RV fxn, mildly dil LA, mild-mod MR; b. 09/2021 Echo: EF 35-40%, glob HK, mod LVH, GrI DD, nl RV fxn, mildly dil RA, mild MR, Asc Ao 40mm.   CKD (chronic kidney disease) stage 2, GFR 60-89 ml/min    Crohn's disease (HCC)  Hypertension    Mixed Ischemic & Nonischemic cardiomyopathy    a. 07/2021 Echo: EF 30-35%; b. 07/2021 Cath: Occluded RCA w/ mod LAD/LCX dzs, and severe OM1 dzs-->Med Rx; c. 09/2021 Echo: EF 35-40%.   PAH (pulmonary artery hypertension) (HCC)    a. 07/2021 RHC: PA 67/30 (42).   Proteinuria     Tobacco Use: Social History   Tobacco Use  Smoking Status Former   Types: Cigars   Quit date: 08/13/2021   Years since quitting: 1.1  Smokeless Tobacco Not on file    Labs: Review Flowsheet  More data exists      Latest Ref Rng & Units 10/07/2021 01/27/2022 07/15/2022 09/21/2022 09/29/2022  Labs for ITP Cardiac and Pulmonary Rehab  Cholestrol 0 - 200 mg/dL 161  096  045  409  -  LDL  (calc) 0 - 99 mg/dL 43  70  81  59  -  HDL-C >40 mg/dL 30  35  39  33  -  Trlycerides <150 mg/dL 811  914  782  956  -  Bicarbonate 20.0 - 28.0 mmol/L - - - - 22.9  23.5   TCO2 22 - 32 mmol/L - - - - 24  25   Acid-base deficit 0.0 - 2.0 mmol/L - - - - 3.0  3.0   O2 Saturation % - - - - 71  68      Pulmonary Assessment Scores:  Pulmonary Assessment Scores     Row Name 07/28/22 1459 07/30/22 1109       ADL UCSD   ADL Phase Entry Entry    SOB Score total 54 54    Rest 5 5    Walk 3 3    Stairs 3 3    Bath 3 3    Dress 2 2    Shop 3 3      CAT Score   CAT Score 22 22      mMRC Score   mMRC Score -- 2             UCSD: Self-administered rating of dyspnea associated with activities of daily living (ADLs) 6-point scale (0 = "not at all" to 5 = "maximal or unable to do because of breathlessness")  Scoring Scores range from 0 to 120.  Minimally important difference is 5 units  CAT: CAT can identify the health impairment of COPD patients and is better correlated with disease progression.  CAT has a scoring range of zero to 40. The CAT score is classified into four groups of low (less than 10), medium (10 - 20), high (21-30) and very high (31-40) based on the impact level of disease on health status. A CAT score over 10 suggests significant symptoms.  A worsening CAT score could be explained by an exacerbation, poor medication adherence, poor inhaler technique, or progression of COPD or comorbid conditions.  CAT MCID is 2 points  mMRC: mMRC (Modified Medical Research Council) Dyspnea Scale is used to assess the degree of baseline functional disability in patients of respiratory disease due to dyspnea. No minimal important difference is established. A decrease in score of 1 point or greater is considered a positive change.   Pulmonary Function Assessment:   Exercise Target Goals: Exercise Program Goal: Individual exercise prescription set using results from initial 6 min  walk test and THRR while considering  patient's activity barriers and safety.   Exercise Prescription Goal: Initial exercise prescription builds to 30-45 minutes a day of aerobic activity, 2-3  days per week.  Home exercise guidelines will be given to patient during program as part of exercise prescription that the participant will acknowledge.  Education: Aerobic Exercise: - Group verbal and visual presentation on the components of exercise prescription. Introduces F.I.T.T principle from ACSM for exercise prescriptions.  Reviews F.I.T.T. principles of aerobic exercise including progression. Written material given at graduation. Flowsheet Row Pulmonary Rehab from 09/02/2022 in [facility] Cardiac and Pulmonary Rehab  Education need identified 07/30/22  Date 08/12/22  Educator KW  Instruction Review Code 1- [employer] Understanding       Education: Resistance Exercise: - Group verbal and visual presentation on the components of exercise prescription. Introduces F.I.T.T principle from ACSM for exercise prescriptions  Reviews F.I.T.T. principles of resistance exercise including progression. Written material given at graduation.    Education: Exercise & Equipment Safety: - Individual verbal instruction and demonstration of equipment use and safety with use of the equipment. Flowsheet Row Pulmonary Rehab from 09/02/2022 in [facility] Cardiac and Pulmonary Rehab  Date 07/30/22  Educator NT  Instruction Review Code 1- Verbalizes Understanding       Education: Exercise Physiology & General Exercise Guidelines: - Group verbal and written instruction with models to review the exercise physiology of the cardiovascular system and associated critical values. Provides general exercise guidelines with specific guidelines to those with heart or lung disease.  Flowsheet Row Pulmonary Rehab from 09/02/2022 in [facility] Cardiac and Pulmonary Rehab  Date 08/05/22  Educator KW  Instruction Review Code 1- [employer]  Understanding       Education: Flexibility, Balance, Mind/Body Relaxation: - Group verbal and visual presentation with interactive activity on the components of exercise prescription. Introduces F.I.T.T principle from ACSM for exercise prescriptions. Reviews F.I.T.T. principles of flexibility and balance exercise training including progression. Also discusses the mind body connection.  Reviews various relaxation techniques to help reduce and manage stress (i.e. Deep breathing, progressive muscle relaxation, and visualization). Balance handout provided to take home. Written material given at graduation.   Activity Barriers & Risk Stratification:  Activity Barriers & Cardiac Risk Stratification - 07/30/22 1111       Activity Barriers & Cardiac Risk Stratification   Activity Barriers Joint Problems;Deconditioning;Shortness of Breath   left shoulder pain            6 Minute Walk:  6 Minute Walk     Row Name 07/30/22 1343         6 Minute Walk   Phase Initial     Distance 940 feet     Walk Time 6 minutes     # of Rest Breaks 0     MPH 1.78     METS 2.78     RPE 13     Perceived Dyspnea  2     VO2 Peak 9.73     Symptoms Yes (comment)     Comments SOB     Resting HR 61 bpm     Resting BP 104/62     Resting Oxygen Saturation  94 %     Exercise Oxygen Saturation  during 6 min walk 94 %     Max Ex. HR 90 bpm     Max Ex. BP 124/72     2 Minute Post BP 100/64       Interval HR   1 Minute HR 83     2 Minute HR 88     3 Minute HR 86     4 Minute HR 86  5 Minute HR 90     6 Minute HR 84     2 Minute Post HR 61     Interval Heart Rate? Yes       Interval Oxygen   Interval Oxygen? Yes     Baseline Oxygen Saturation % 94 %     1 Minute Oxygen Saturation % 94 %     1 Minute Liters of Oxygen 0 L  RA     2 Minute Liters of Oxygen 0 L  RA     3 Minute Oxygen Saturation % 94 %     3 Minute Liters of Oxygen 0 L  RA     4 Minute Oxygen Saturation % 94 %     4 Minute  Liters of Oxygen 0 L  RA     5 Minute Oxygen Saturation % 94 %     5 Minute Liters of Oxygen 0 L  RA     6 Minute Oxygen Saturation % 94 %     6 Minute Liters of Oxygen 0 L  RA     2 Minute Post Oxygen Saturation % 96 %     2 Minute Post Liters of Oxygen 0 L  RA             Oxygen Initial Assessment:  Oxygen Initial Assessment - 07/28/22 1416       Home Oxygen   Home Oxygen Device None    Sleep Oxygen Prescription None    Home Exercise Oxygen Prescription None    Home Resting Oxygen Prescription None    Compliance with Home Oxygen Use Yes      Intervention   Short Term Goals To learn and understand importance of maintaining oxygen saturations>88%;To learn and demonstrate proper pursed lip breathing techniques or other breathing techniques. ;To learn and understand importance of monitoring SPO2 with pulse oximeter and demonstrate accurate use of the pulse oximeter.    Long  Term Goals Maintenance of O2 saturations>88%;Compliance with respiratory medication;Verbalizes importance of monitoring SPO2 with pulse oximeter and return demonstration;Exhibits proper breathing techniques, such as pursed lip breathing or other method taught during program session             Oxygen Re-Evaluation:  Oxygen Re-Evaluation     Row Name 08/03/22 1540 08/17/22 1559           Program Oxygen Prescription   Program Oxygen Prescription -- None        Home Oxygen   Home Oxygen Device -- None      Sleep Oxygen Prescription -- None      Home Exercise Oxygen Prescription -- None      Home Resting Oxygen Prescription -- None        Goals/Expected Outcomes   Short Term Goals -- To learn and demonstrate proper pursed lip breathing techniques or other breathing techniques.       Long  Term Goals -- Exhibits proper breathing techniques, such as pursed lip breathing or other method taught during program session      Comments Reviewed PLB technique with pt.  Talked about how it works and it's  importance in maintaining their exercise saturations. Informed patient how to perform the Pursed Lipped breathing technique. Told patient to Inhale through the nose and out the mouth with pursed lips to keep their airways open, help oxygenate them better, practice when at rest or doing strenuous activity. Patient Verbalizes understanding of technique and will work on and be reiterated during  [employer].      Goals/Expected Outcomes Short: Become more profiecient at using PLB.   Long: Become independent at using PLB. Short: use PLB with exertion. Long: use PLB on exertion proficiently and independently.               Oxygen Discharge (Final Oxygen Re-Evaluation):  Oxygen Re-Evaluation - 08/17/22 1559       Program Oxygen Prescription   Program Oxygen Prescription None      Home Oxygen   Home Oxygen Device None    Sleep Oxygen Prescription None    Home Exercise Oxygen Prescription None    Home Resting Oxygen Prescription None      Goals/Expected Outcomes   Short Term Goals To learn and demonstrate proper pursed lip breathing techniques or other breathing techniques.     Long  Term Goals Exhibits proper breathing techniques, such as pursed lip breathing or other method taught during program session    Comments Informed patient how to perform the Pursed Lipped breathing technique. Told patient to Inhale through the nose and out the mouth with pursed lips to keep their airways open, help oxygenate them better, practice when at rest or doing strenuous activity. Patient Verbalizes understanding of technique and will work on and be reiterated during [employer].    Goals/Expected Outcomes Short: use PLB with exertion. Long: use PLB on exertion proficiently and independently.             Initial Exercise Prescription:  Initial Exercise Prescription - 07/30/22 1400       Date of Initial Exercise RX and Referring Provider   Date 07/30/22    Referring Provider Dr. Yvonne Kendall, MD       Oxygen   Maintain Oxygen Saturation 88% or higher      Treadmill   MPH 1.8    Grade 0.5    Minutes 15    METs 2.5      Recumbant Bike   Level 1    RPM 50    Watts 25    Minutes 15    METs 2.78      NuStep   Level 2    SPM 80    Minutes 15    METs 2.78      REL-XR   Level 2    Speed 50    Minutes 15    METs 2.78      Prescription Details   Frequency (times per week) 3    Duration Progress to 30 minutes of continuous aerobic without signs/symptoms of physical distress      Intensity   THRR 40-80% of Max Heartrate 103-145    Ratings of Perceived Exertion 11-13    Perceived Dyspnea 0-4      Progression   Progression Continue to progress workloads to maintain intensity without signs/symptoms of physical distress.      Resistance Training   Training Prescription Yes    Weight 4 lb    Reps 10-15             Perform Capillary Blood Glucose checks as needed.  Exercise Prescription Changes:   Exercise Prescription Changes     Row Name 07/30/22 1400 08/12/22 1400 08/27/22 1400 09/01/22 0800 09/09/22 1300     Response to Exercise   Blood Pressure (Admit) 104/62 102/60 110/68 -- 110/60   Blood Pressure (Exercise) 124/72 122/68 148/78 -- --   Blood Pressure (Exit) 100/64 98/60 124/78 -- 92/64   Heart Rate (Admit) 61 bpm 78 bpm  63 bpm -- 69 bpm   Heart Rate (Exercise) 90 bpm 102 bpm 94 bpm -- 86 bpm   Heart Rate (Exit) 61 bpm 83 bpm 76 bpm -- 73 bpm   Oxygen Saturation (Admit) 94 % 97 % 97 % -- 97 %   Oxygen Saturation (Exercise) 94 % 95 % 93 % -- 93 %   Oxygen Saturation (Exit) 96 % 95 % 95 % -- 94 %   Rating of Perceived Exertion (Exercise) 13 15 15  -- 15   Perceived Dyspnea (Exercise) 2 2 3  -- 2   Symptoms SOB SOB SOB -- SOB   Comments Results 3rd full day of exercise -- -- --   Duration -- Progress to 30 minutes of  aerobic without signs/symptoms of physical distress Continue with 30 min of aerobic exercise without signs/symptoms of physical  distress. -- Continue with 30 min of aerobic exercise without signs/symptoms of physical distress.   Intensity -- THRR unchanged THRR unchanged -- THRR unchanged     Progression   Progression -- Continue to progress workloads to maintain intensity without signs/symptoms of physical distress. Continue to progress workloads to maintain intensity without signs/symptoms of physical distress. -- Continue to progress workloads to maintain intensity without signs/symptoms of physical distress.   Average METs -- 2.6 2.76 -- 2.33     Resistance Training   Training Prescription -- Yes Yes -- Yes   Weight -- 4 lb 4 lb -- 5 lb   Reps -- 10-15 10-15 -- 10-15     Interval Training   Interval Training -- No No -- No     Treadmill   MPH -- 1.8 1.9 -- 1.9   Grade -- 2 1.5 -- 0   Minutes -- 15 15 -- 15   METs -- 2.87 2.85 -- 2.45     NuStep   Level -- 2 2 -- --   Minutes -- 15 15 -- --   METs -- 2.5 2.3 -- --     Recumbant Elliptical   Level -- -- 1 -- --   Minutes -- -- 15 -- --   METs -- -- 1.4 -- --     Elliptical   Level -- -- -- -- 1   Minutes -- -- -- -- 15     REL-XR   Level -- 2 3 -- 4   Minutes -- 15 15 -- 15   METs -- 2.6 3.7 -- 2.6     T5 Nustep   Level -- 1 4 -- --   Minutes -- 15 15 -- --   METs -- 2.3 2.2 -- --     Home Exercise Plan   Plans to continue exercise at -- -- -- Home (comment)  walking Home (comment)  walking   Frequency -- -- -- Add 2 additional days to program exercise sessions. Add 2 additional days to program exercise sessions.   Initial Home Exercises Provided -- -- -- 08/31/22 08/31/22     Oxygen   Maintain Oxygen Saturation -- 88% or higher 88% or higher -- 88% or higher    Row Name 09/22/22 1500             Response to Exercise   Blood Pressure (Admit) 82/58       Blood Pressure (Exit) 84/60       Heart Rate (Admit) 64 bpm       Heart Rate (Exercise) 76 bpm       Heart Rate (Exit) 67  bpm       Oxygen Saturation (Admit) 96 %        Oxygen Saturation (Exercise) 95 %       Oxygen Saturation (Exit) 96 %       Rating of Perceived Exertion (Exercise) 12       Symptoms SOB       Duration Continue with 30 min of aerobic exercise without signs/symptoms of physical distress.       Intensity THRR unchanged         Progression   Progression Continue to progress workloads to maintain intensity without signs/symptoms of physical distress.       Average METs 1.8         Resistance Training   Training Prescription Yes       Weight 5 lb       Reps 10-15         Interval Training   Interval Training No         T5 Nustep   Level 4       Minutes 30       METs 1.8         Home Exercise Plan   Plans to continue exercise at Home (comment)  walking       Frequency Add 2 additional days to program exercise sessions.       Initial Home Exercises Provided 08/31/22         Oxygen   Maintain Oxygen Saturation 88% or higher                Exercise Comments:   Exercise Comments     Row Name 08/03/22 1539           Exercise Comments First full day of exercise!  Patient was oriented to gym and equipment including functions, settings, policies, and procedures.  Patient's individual exercise prescription and treatment plan were reviewed.  All starting workloads were established based on the results of the 6 minute walk test done at initial orientation visit.  The plan for exercise progression was also introduced and progression will be customized based on patient's performance and goals.                Exercise Goals and Review:   Exercise Goals     Row Name 07/30/22 1112             Exercise Goals   Increase Physical Activity Yes       Intervention Provide advice, education, support and counseling about physical activity/exercise needs.;Develop an individualized exercise prescription for aerobic and resistive training based on initial evaluation findings, risk stratification, comorbidities and participant's  personal goals.       Expected Outcomes Short Term: Attend rehab on a regular basis to increase amount of physical activity.;Long Term: Add in home exercise to make exercise part of routine and to increase amount of physical activity.;Long Term: Exercising regularly at least 3-5 days a week.       Increase Strength and Stamina Yes       Intervention Provide advice, education, support and counseling about physical activity/exercise needs.;Develop an individualized exercise prescription for aerobic and resistive training based on initial evaluation findings, risk stratification, comorbidities and participant's personal goals.       Expected Outcomes Short Term: Increase workloads from initial exercise prescription for resistance, speed, and METs.;Long Term: Improve cardiorespiratory fitness, muscular endurance and strength as measured by increased METs and functional capacity ( );Short Term: Perform resistance training exercises  routinely during rehab and add in resistance training at home       Able to understand and use rate of perceived exertion (RPE) scale Yes       Intervention Provide education and explanation on how to use RPE scale       Expected Outcomes Short Term: Able to use RPE daily in rehab to express subjective intensity level;Long Term:  Able to use RPE to guide intensity level when exercising independently       Able to understand and use Dyspnea scale Yes       Intervention Provide education and explanation on how to use Dyspnea scale       Expected Outcomes Short Term: Able to use Dyspnea scale daily in rehab to express subjective sense of shortness of breath during exertion;Long Term: Able to use Dyspnea scale to guide intensity level when exercising independently       Knowledge and understanding of Target Heart Rate Range (THRR) Yes       Intervention Provide education and explanation of THRR including how the numbers were predicted and where they are located for reference        Expected Outcomes Short Term: Able to state/look up THRR;Long Term: Able to use THRR to govern intensity when exercising independently;Short Term: Able to use daily as guideline for intensity in rehab       Able to check pulse independently Yes       Intervention Provide education and demonstration on how to check pulse in carotid and radial arteries.;Review the importance of being able to check your own pulse for safety during independent exercise       Expected Outcomes Short Term: Able to explain why pulse checking is important during independent exercise;Long Term: Able to check pulse independently and accurately       Understanding of Exercise Prescription Yes       Intervention Provide education, explanation, and written materials on patient's individual exercise prescription       Expected Outcomes Short Term: Able to explain program exercise prescription;Long Term: Able to explain home exercise prescription to exercise independently                Exercise Goals Re-Evaluation :  Exercise Goals Re-Evaluation     Row Name 08/03/22 1539 08/12/22 1426 08/27/22 1446 09/01/22 0822 09/09/22 1359     Exercise Goal Re-Evaluation   Exercise Goals Review Increase Physical Activity;Able to understand and use rate of perceived exertion (RPE) scale;Knowledge and understanding of Target Heart Rate Range (THRR);Understanding of Exercise Prescription;Increase Strength and Stamina;Able to check pulse independently;Able to understand and use Dyspnea scale Increase Physical Activity;Increase Strength and Stamina;Understanding of Exercise Prescription Increase Physical Activity;Increase Strength and Stamina;Understanding of Exercise Prescription Increase Physical Activity;Increase Strength and Stamina;Understanding of Exercise Prescription;Able to understand and use rate of perceived exertion (RPE) scale;Able to understand and use Dyspnea scale;Knowledge and understanding of Target Heart Rate Range  (THRR);Able to check pulse independently Increase Physical Activity;Increase Strength and Stamina;Understanding of Exercise Prescription   Comments Reviewed RPE scale, THR and program prescription with pt today.  Pt voiced understanding and was given a copy of goals to take home. Chez is off to a good start with his rehab sessions. He has been able to do his initial exercise prescription and even went up to 2% incline on the treadmill. He is not quite hitting his THR but will continue to monitor that as he progresses in the program. Chueyee continues to do well in  rehab. He recently increased his overall average MET level to 2.76 METs. He also improved to level 4 on the T5 nustep and level 3 on the XR. He began using the recumbent elliptical at level 1 as well. He also increased his treadmill speed to 1.9 mph while maintaining an incline of 1.5%. We will continue to monitor his progress in the program. Reviewed home exercise with pt today.  Pt plans to walk at home for exercise.  He is going to build up to 30 min.  Reviewed THR, pulse, RPE, sign and symptoms, pulse oximetery and when to call 911 or MD.  Also discussed weather considerations and indoor options.  Pt voiced understanding. Artin continues to do well in rehab. He did take some incline off of his treadmill. Some days he experiences low BP and nausea and accommodates workloads based off of that. He did try out the elliptical one day and was able to do level 1. He started using 5 lb handweights as well! We will continue to monitor.   Expected Outcomes Short: Use RPE daily to regulate intensity.  Long: Follow program prescription in THR. Short: Continue initial exercise prescription Long: Increase overall strength and stamina Short: Continue to progressively increase treadmill workload. Long: Continue to improve strength and stamina. Short: Start to build up time walking at home Long; conitnue to exercise indpendently Short: Slowly add incline back to  treadmill when appropriate Long: Continue to increase overall MET level and stamina    Row Name 09/22/22 1503             Exercise Goal Re-Evaluation   Exercise Goals Review Increase Physical Activity;Increase Strength and Stamina;Understanding of Exercise Prescription       Comments Deiondre has only attended rehab once since the last review due to issues with low BP and chest pain. During his one session he was able to work at level 4 on the T5 nustep for 30 minutes. He has a heart cath scheduled for 09/29/2022. He plans to return after this procedure. We will continue to monitor his progress when he returns to the program.       Expected Outcomes Short: Return to rehab when appropriate. Long: Continue to improve strength and stamina.                Discharge Exercise Prescription (Final Exercise Prescription Changes):  Exercise Prescription Changes - 09/22/22 1500       Response to Exercise   Blood Pressure (Admit) 82/58    Blood Pressure (Exit) 84/60    Heart Rate (Admit) 64 bpm    Heart Rate (Exercise) 76 bpm    Heart Rate (Exit) 67 bpm    Oxygen Saturation (Admit) 96 %    Oxygen Saturation (Exercise) 95 %    Oxygen Saturation (Exit) 96 %    Rating of Perceived Exertion (Exercise) 12    Symptoms SOB    Duration Continue with 30 min of aerobic exercise without signs/symptoms of physical distress.    Intensity THRR unchanged      Progression   Progression Continue to progress workloads to maintain intensity without signs/symptoms of physical distress.    Average METs 1.8      Resistance Training   Training Prescription Yes    Weight 5 lb    Reps 10-15      Interval Training   Interval Training No      T5 Nustep   Level 4    Minutes 30    METs  1.8      Home Exercise Plan   Plans to continue exercise at Home (comment)   walking   Frequency Add 2 additional days to program exercise sessions.    Initial Home Exercises Provided 08/31/22      Oxygen   Maintain  Oxygen Saturation 88% or higher             Nutrition:  Target Goals: Understanding of nutrition guidelines, daily intake of sodium 1500mg , cholesterol 200mg , calories 30% from fat and 7% or less from saturated fats, daily to have 5 or more servings of fruits and vegetables.  Education: All About Nutrition: -Group instruction provided by verbal, written material, interactive activities, discussions, models, and posters to present general guidelines for heart healthy nutrition including fat, fiber, MyPlate, the role of sodium in heart healthy nutrition, utilization of the nutrition label, and utilization of this knowledge for meal planning. Follow up email sent as well. Written material given at graduation. Flowsheet Row Pulmonary Rehab from 09/02/2022 in [facility] Card[facility] and Pulmonary Rehab  Education need identified 07/30/22  Date 09/02/22  [part 2]  Educator KW  Instruction Review Code 1- Verbalizes Understanding       Biometrics:  Pre Biometrics - 07/30/22 1112       Pre Biometrics   Height 5' 9.75" (1.772 m)    Weight 225 lb 9.6 oz (102.3 kg)    Waist Circumference 43 inches    Hip Circumference 44 inches    Waist to Hip Ratio 0.98 %    BMI (Calculated) 32.59    Single Leg Stand 30 seconds              Nutrition Therapy Plan and Nutrition Goals:  Nutrition Therapy & Goals - 07/30/22 1038       Nutrition Therapy   Diet Heart healthy, low Na    Drug/Food Interactions Statins/Certain Fruits    Protein (specify units) 80-85g    Fiber 35 grams    Whole Grain Foods 3 servings    Saturated Fats 16 max. grams    Fruits and Vegetables 8 servings/day    Sodium 2 grams      Personal Nutrition Goals   Nutrition Goal ST: practice MyPlate guidelines, practice reading food labels  LT: limit Na <2g/day, limit saturated fat <16g/day, aim for 30-35g of fiber per day    Comments 52 y.o. M admitted to pulmonary rehab for Chronic combined systolic and diastolic congestive heart  failure. PMHx includes CAD, CKD stg 2, crohn's disease, HTN, pulmonary artery hypertension. Reviewed relevant medications atorvastatin, jardiance, zetia, pepcid, furosemide. Reviewed most recent labs. Hezakiah reports that his Crohns has been well controlled and his main trigger foods are certain types of processed meats; eating smaller/more frequent meals help to manage his symptoms as well. Davies reports limiting chinese food, pizza, and fried foods. He has been trying to use a grill for his cooking to avoid lots of added fat or grease. He has been trying to cut back on soda and now only has it occassionally. Discussed food patterns and heart healthy eating as well as MyPlate. Encouagred him to read labels, choose liquid cooking oils, and to include protein, healthy fat, and fiber at meals to make them more nutrient dense and satiating. Discussed CKD stg 2 MNT and how a modest protei is recommeneded (0.8 g/kg/d) - encouraged to be mindful of animal protein portions and to include plant protein.      Intervention Plan   Intervention Prescribe, educate and  counsel regarding individualized specific dietary modifications aiming towards targeted core components such as weight, hypertension, lipid management, diabetes, heart failure and other comorbidities.;Nutrition handout(s) given to patient.    Expected Outcomes Short Term Goal: Understand basic principles of dietary content, such as calories, fat, sodium, cholesterol and nutrients.;Short Term Goal: A plan has been developed with personal nutrition goals set during dietitian appointment.;Long Term Goal: Adherence to prescribed nutrition plan.             Nutrition Assessments:  MEDIFICTS Score Key: ?70 Need to make dietary changes  40-70 Heart Healthy Diet ? 40 Therapeutic Level Cholesterol Diet  Flowsheet Row Pulmonary Rehab from 07/28/2022 in [facility] Cardiac and Pulmonary Rehab  Picture Your Plate Total Score on Admission 51      Picture Your  Plate Scores: <16 Unhealthy dietary pattern with much room for improvement. 41-50 Dietary pattern unlikely to meet recommendations for good health and room for improvement. 51-60 More healthful dietary pattern, with some room for improvement.  >60 Healthy dietary pattern, although there may be some specific behaviors that could be improved.   Nutrition Goals Re-Evaluation:  Nutrition Goals Re-Evaluation     Row Name 08/17/22 1602             Goals   Current Weight 230 lb (104.3 kg)       Nutrition Goal Lose some weight       Comment Patient was informed on why it is important to maintain a balanced diet when dealing with Respiratory issues. Explained that it takes a lot of energy to breath and when they are short of breath often they will need to have a good diet to help keep up with the calories they are expending for breathing.       Expected Outcome Short: Choose and plan snacks accordingly to patients caloric intake to improve breathing. Long: Maintain a diet independently that meets their caloric intake to aid in daily shortness of breath.                Nutrition Goals Discharge (Final Nutrition Goals Re-Evaluation):  Nutrition Goals Re-Evaluation - 08/17/22 1602       Goals   Current Weight 230 lb (104.3 kg)    Nutrition Goal Lose some weight    Comment Patient was informed on why it is important to maintain a balanced diet when dealing with Respiratory issues. Explained that it takes a lot of energy to breath and when they are short of breath often they will need to have a good diet to help keep up with the calories they are expending for breathing.    Expected Outcome Short: Choose and plan snacks accordingly to patients caloric intake to improve breathing. Long: Maintain a diet independently that meets their caloric intake to aid in daily shortness of breath.             Psychosocial: Target Goals: Acknowledge presence or absence of significant depression and/or  stress, maximize coping skills, provide positive support system. Participant is able to verbalize types and ability to use techniques and skills needed for reducing stress and depression.   Education: Stress, Anxiety, and Depression - Group verbal and visual presentation to define topics covered.  Reviews how body is impacted by stress, anxiety, and depression.  Also discusses healthy ways to reduce stress and to treat/manage anxiety and depression.  Written material given at graduation.   Education: Sleep Hygiene -Provides group verbal and written instruction about how sleep can affect your  health.  Define sleep hygiene, discuss sleep cycles and impact of sleep habits. Review good sleep hygiene tips.    Initial Review & Psychosocial Screening:  Initial Psych Review & Screening - 07/28/22 1349       Initial Review   Current issues with Current Stress Concerns    Source of Stress Concerns Chronic Illness      Family Dynamics   Good Support System? Yes   mother     Barriers   Psychosocial barriers to participate in program There are no identifiable barriers or psychosocial needs.;The patient should benefit from training in stress management and relaxation.      Screening Interventions   Interventions To provide support and resources with identified psychosocial needs;Encouraged to exercise;Provide feedback about the scores to participant    Expected Outcomes Short Term goal: Utilizing psychosocial counselor, staff and physician to assist with identification of specific Stressors or current issues interfering with healing process. Setting desired goal for each stressor or current issue identified.;Long Term Goal: Stressors or current issues are controlled or eliminated.;Short Term goal: Identification and review with participant of any Quality of Life or Depression concerns found by scoring the questionnaire.;Long Term goal: The participant improves quality of Life and PHQ9 Scores as seen by  post scores and/or verbalization of changes             Quality of Life Scores:  Scores of 19 and below usually indicate a poorer quality of life in these areas.  A difference of  2-3 points is a clinically meaningful difference.  A difference of 2-3 points in the total score of the Quality of Life Index has been associated with significant improvement in overall quality of life, self-image, physical symptoms, and general health in studies assessing change in quality of life.  PHQ-9: Review Flowsheet  More data exists      09/18/2022 08/17/2022 07/30/2022 07/15/2022 10/29/2021  Depression screen PHQ 2/9  Decreased Interest 0 0 3 0 0  Down, Depressed, Hopeless 0 0 0 0 0  PHQ - 2 Score 0 0 3 0 0  Altered sleeping - 0 3 0 -  Tired, decreased energy - 1 3 0 -  Change in appetite - 3 2 0 -  Feeling bad or failure about yourself  - 0 0 0 -  Trouble concentrating - 0 0 0 -  Moving slowly or fidgety/restless - 0 0 0 -  Suicidal thoughts - 0 0 0 -  PHQ-9 Score - 4 11 0 -  Difficult doing work/chores - Not difficult at all Very difficult Not difficult at all -   Interpretation of Total Score  Total Score Depression Severity:  1-4 = Minimal depression, 5-9 = Mild depression, 10-14 = Moderate depression, 15-19 = Moderately severe depression, 20-27 = Severe depression   Psychosocial Evaluation and Intervention:  Psychosocial Evaluation - 07/28/22 1418       Psychosocial Evaluation & Interventions   Interventions Stress management education;Relaxation education;Encouraged to exercise with the program and follow exercise prescription    Comments Jaedyn is coming to Pulmonary Rehab for combined heart failure. His most recent EF is 35-40%. He reports having two heart attacks in the past. 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 He is living with his mother since his heart attack. He is hesitant to exercise or get back to his normal routine because he is  scared about his heart and doesn't want anything to happen to him for  his mother's sake. He mentioned that if he goes out, he always makes sure he is close to a hospital in case something happens. He enjoys talking about his job, but doesn't know if he will be able to do all that is required physically. 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 He wants to come to the program to work on his strength and stamina. He also wants more education on what is going on with his heart and his prognosis.    Expected Outcomes Short: attend pulmonary rehab for education and exercise. Long: develop and maintain positive self care habits.    Continue Psychosocial Services  Follow up required by staff             Psychosocial Re-Evaluation:  Psychosocial Re-Evaluation     Row Name 08/17/22 1604             Psychosocial Re-Evaluation   Current issues with Current Stress Concerns       Comments Reviewed patient health questionnaire (PHQ-9) with patient for follow up. Previously, patients score indicated signs/symptoms of depression.  Reviewed to see if patient is improving symptom wise while in program.  Score improved and patient states that it is because he has been able to have more energy.       Expected Outcomes Short: Continue to attend [facility] regularly for regular exercise and social engagement. Long: Continue to improve symptoms and manage a positive mental state.       Continue Psychosocial Services  Follow up required by staff                Psychosocial Discharge (Final Psychosocial Re-Evaluation):  Psychosocial Re-Evaluation - 08/17/22 1604       Psychosocial Re-Evaluation   Current issues with Current Stress Concerns    Comments Reviewed patient health questionnaire (PHQ-9) with patient for follow up. Previously, patients score indicated signs/symptoms of depression.  Reviewed to see if patient is improving  symptom wise while in program.  Score improved and patient states that it is because he has been able to have more energy.    Expected Outcomes Short: Continue to attend [facility] regularly for regular exercise and social engagement. Long: Continue to improve symptoms and manage a positive mental state.    Continue Psychosocial Services  Follow up required by staff             Education: Education Goals: Education classes will be provided on a weekly basis, covering required topics. Participant will state understanding/return demonstration of topics presented.  Learning Barriers/Preferences:  Learning Barriers/Preferences - 07/28/22 1335       Learning Barriers/Preferences   Learning Barriers None    Learning Preferences None             General Pulmonary Education Topics:  Infection Prevention: - Provides verbal and written material to individual with discussion of infection control including proper hand washing and proper equipment cleaning during exercise session. Flowsheet Row Pulmonary Rehab from 09/02/2022 in [facility] Cardiac and Pulmonary Rehab  Date 07/30/22  Educator NT  Instruction Review Code 1- Verbalizes Understanding       Falls Prevention: - Provides verbal and written material to individual with discussion of falls prevention and safety. Flowsheet Row Pulmonary Rehab from 09/02/2022 in [facility] Cardiac and Pulmonary Rehab  Date 07/30/22  Educator NT  Instruction Review Code 1- Verbalizes Understanding       Chronic Lung Disease Review: - Group  verbal instruction with posters, models, PowerPoint presentations and videos,  to review new updates, new respiratory medications, new advancements in procedures and treatments. Providing information on websites and "800" numbers for continued self-education. Includes information about supplement oxygen, available portable oxygen systems, continuous and intermittent flow rates, oxygen safety, concentrators, and Medicare  reimbursement for oxygen. Explanation of Pulmonary Drugs, including class, frequency, complications, importance of spacers, rinsing mouth after steroid MDI's, and proper cleaning methods for nebulizers. Review of basic lung anatomy and physiology related to function, structure, and complications of lung disease. Review of risk factors. Discussion about methods for diagnosing sleep apnea and types of masks and machines for OSA. Includes a review of the use of types of environmental controls: home humidity, furnaces, filters, dust mite/pet prevention, HEPA vacuums. Discussion about weather changes, air quality and the benefits of nasal washing. Instruction on Warning signs, infection symptoms, calling MD promptly, preventive modes, and value of vaccinations. Review of effective airway clearance, coughing and/or vibration techniques. Emphasizing that all should Create an Action Plan. Written material given at graduation.   AED/CPR: - Group verbal and written instruction with the use of models to demonstrate the basic use of the AED with the basic ABC's of resuscitation.    Anatomy and Cardiac Procedures: - Group verbal and visual presentation and models provide information about basic cardiac anatomy and function. Reviews the testing methods done to diagnose heart disease and the outcomes of the test results. Describes the treatment choices: Medical Management, Angioplasty, or Coronary Bypass Surgery for treating various heart conditions including Myocardial Infarction, Angina, Valve Disease, and Cardiac Arrhythmias.  Written material given at graduation. Flowsheet Row Pulmonary Rehab from 09/02/2022 in [facility] Cardiac and Pulmonary Rehab  Education need identified 07/30/22       Medication Safety: - Group verbal and visual instruction to review commonly prescribed medications for heart and lung disease. Reviews the medication, class of the drug, and side effects. Includes the steps to properly store meds  and maintain the prescription regimen.  Written material given at graduation. Flowsheet Row Pulmonary Rehab from 09/02/2022 in [facility] Cardiac and Pulmonary Rehab  Education need identified 07/30/22       Other: -Provides group and verbal instruction on various topics (see comments)   Knowledge Questionnaire Score:  Knowledge Questionnaire Score - 07/28/22 1458       Knowledge Questionnaire Score   Pre Score 15/26              Core Components/Risk Factors/Patient Goals at Admission:  Personal Goals and Risk Factors at Admission - 07/30/22 1110       Core Components/Risk Factors/Patient Goals on Admission    Weight Management Yes;Weight Loss    Intervention Weight Management: Develop a combined nutrition and exercise program designed to reach desired caloric intake, while maintaining appropriate intake of nutrient and fiber, sodium and fats, and appropriate energy expenditure required for the weight goal.;Weight Management: Provide education and appropriate resources to help participant work on and attain dietary goals.;Weight Management/Obesity: Establish reasonable short term and long term weight goals.    Admit Weight 225 lb 9.6 oz (102.3 kg)    Goal Weight: Short Term 215 lb (97.5 kg)    Goal Weight: Long Term 195 lb (88.5 kg)    Expected Outcomes Weight Loss: Understanding of general recommendations for a balanced deficit meal plan, which promotes 1-2 lb weight loss per week and includes a negative energy balance of [phone] kcal/d;Short Term: Continue to assess and modify interventions until short term  weight is achieved;Long Term: Adherence to nutrition and physical activity/exercise program aimed toward attainment of established weight goal;Understanding recommendations for meals to include 15-35% energy as protein, 25-35% energy from fat, 35-60% energy from carbohydrates, less than 200mg  of dietary cholesterol, 20-35 gm of total fiber daily;Understanding of distribution of  calorie intake throughout the day with the consumption of 4-5 meals/snacks    Heart Failure Yes    Intervention Provide a combined exercise and nutrition program that is supplemented with education, support and counseling about heart failure. Directed toward relieving symptoms such as shortness of breath, decreased exercise tolerance, and extremity edema.    Expected Outcomes Improve functional capacity of life;Short term: Attendance in program 2-3 days a week with increased exercise capacity. Reported lower sodium intake. Reported increased fruit and vegetable intake. Reports medication compliance.;Long term: Adoption of self-care skills and reduction of barriers for early signs and symptoms recognition and intervention leading to self-care maintenance.;Short term: Daily weights obtained and reported for increase. Utilizing diuretic protocols set by physician.    Hypertension Yes    Intervention Provide education on lifestyle modifcations including regular physical activity/exercise, weight management, moderate sodium restriction and increased consumption of fresh fruit, vegetables, and low fat dairy, alcohol moderation, and smoking cessation.;Monitor prescription use compliance.    Expected Outcomes Long Term: Maintenance of blood pressure at goal levels.;Short Term: Continued assessment and intervention until BP is < 140/44mm HG in hypertensive participants. < 130/59mm HG in hypertensive participants with diabetes, heart failure or chronic kidney disease.    Lipids Yes    Intervention Provide education and support for participant on nutrition & aerobic/resistive exercise along with prescribed medications to achieve LDL 70mg , HDL >40mg .    Expected Outcomes Short Term: Participant states understanding of desired cholesterol values and is compliant with medications prescribed. Participant is following exercise prescription and nutrition guidelines.;Long Term: Cholesterol controlled with medications as  prescribed, with individualized exercise RX and with personalized nutrition plan. Value goals: LDL < 70mg , HDL > 40 mg.             Education:Diabetes - Individual verbal and written instruction to review signs/symptoms of diabetes, desired ranges of glucose level fasting, after meals and with exercise. Acknowledge that pre and post exercise glucose checks will be done for 3 sessions at entry of program.   Know Your Numbers and Heart Failure: - Group verbal and visual instruction to discuss disease risk factors for cardiac and pulmonary disease and treatment options.  Reviews associated critical values for Overweight/Obesity, Hypertension, Cholesterol, and Diabetes.  Discusses basics of heart failure: signs/symptoms and treatments.  Introduces Heart Failure Zone chart for action plan for heart failure.  Written material given at graduation. Flowsheet Row Pulmonary Rehab from 09/02/2022 in [facility] Cardiac and Pulmonary Rehab  Education need identified 07/30/22       Core Components/Risk Factors/Patient Goals Review:   Goals and Risk Factor Review     Row Name 08/17/22 1602             Core Components/Risk Factors/Patient Goals Review   Personal Goals Review Improve shortness of breath with ADL's       Review Spoke to patient about their shortness of breath and what they can do to improve. Patient has been informed of breathing techniques when starting the program. Patient is informed to tell staff if they have had any med changes and that certain meds they are taking or not taking can be causing shortness of breath.  Expected Outcomes Short: Attend [facility] regularly to improve shortness of breath with ADL's. Long: maintain independence with ADL's                Core Components/Risk Factors/Patient Goals at Discharge (Final Review):   Goals and Risk Factor Review - 08/17/22 1602       Core Components/Risk Factors/Patient Goals Review   Personal Goals Review Improve  shortness of breath with ADL's    Review Spoke to patient about their shortness of breath and what they can do to improve. Patient has been informed of breathing techniques when starting the program. Patient is informed to tell staff if they have had any med changes and that certain meds they are taking or not taking can be causing shortness of breath.    Expected Outcomes Short: Attend [facility] regularly to improve shortness of breath with ADL's. Long: maintain independence with ADL's             ITP Comments:  ITP Comments     Row Name 07/28/22 1308 07/30/22 1108 08/03/22 1539 08/05/22 0831 09/02/22 1159   ITP Comments Initial orientation completed. Diagnosis can be found in [facility] 2/28. EP Orientation scheduled for Thursday 3/14 at 9:30am. Completed and gym orientation. Initial ITP created and sent for review to Dr. Vida Rigger, Medical Director. First full day of exercise!  Patient was oriented to gym and equipment including functions, settings, policies, and procedures.  Patient's individual exercise prescription and treatment plan were reviewed.  All starting workloads were established based on the results of the 6 minute walk test done at initial orientation visit.  The plan for exercise progression was also introduced and progression will be customized based on patient's performance and goals. 30 Day review completed. Medical Director ITP review done, changes made as directed, and signed approval by Medical Director.     new to program 30 day review completed. ITP sent to Dr. Jinny Sanders, Medical Director of  Pulmonary Rehab. Continue with ITP unless changes are made by physician.    Row Name 09/21/22 1531 09/29/22 0835 09/30/22 0847       ITP Comments Enrico stopped by to inform staff that they are wanting to do a heart cath next week because of his symptoms. He is following closely with his doctors to manage his blood pressure concerns and his chest pain. He will come back to  class after his heart cath. Since Wah has been out on medical hold, we have not been able to assess for goals this round. 30 Day review completed. Medical Director ITP review done, changes made as directed, and signed approval by Medical Director.   out with medical concerns.  admitted to [facility] 5/14              Comments:

## 2022-09-30 NOTE — TOC Initial Note (Signed)
Transition of Care [facility]) - Initial/Assessment Note    Patient Details  Name: Clarence [name] MRN: [id number] Date of Birth: [birth year]  [facility] CM/SW Contact:    Elliot Cousin, RN Phone Number: [phone] 09/30/2022, 3:06 PM  Clinical Narrative:      HF TOC CM spoke to pt and states he wants to continue his Pulm Rehab. But they cancelled his appt for today and tomorrow. Educated pt to follow up at his PCP on 5/17 or speak to HF specialist at appt on 5/29.  Pt states he has scale at home for daily weights.            Expected Discharge Plan: Home/Self Care Barriers to Discharge: No Barriers Identified   Patient Goals and CMS Choice Patient states their goals for this hospitalization and ongoing recovery are:: patient wants to continue Pulmonary Rehab          Expected Discharge Plan and Services   Discharge Planning Services: CM Consult   Living arrangements for the past 2 months: Single Family Home Expected Discharge Date: 09/29/22                                    Prior Living Arrangements/Services Living arrangements for the past 2 months: Single Family Home Lives with:: Parents Patient language and need for interpreter reviewed:: Yes        Need for Family Participation in Patient Care: No (Comment) Care giver support system in place?: No (comment) Current home services: DME (scale) Criminal Activity/Legal Involvement Pertinent to Current Situation/Hospitalization: No - Comment as needed  Activities of Daily Living Home Assistive Devices/Equipment: None ADL Screening (condition at time of admission) Patient's cognitive ability adequate to safely complete daily activities?: Yes Is the patient deaf or have difficulty hearing?: No Does the patient have difficulty seeing, even when wearing glasses/contacts?: No Does the patient have difficulty concentrating, remembering, or making decisions?: No Patient able to express need for  assistance with ADLs?: Yes Does the patient have difficulty dressing or bathing?: No Independently performs ADLs?: Yes (appropriate for developmental age) Does the patient have difficulty walking or climbing stairs?: No Weakness of Legs: None Weakness of Arms/Hands: None  Permission Sought/Granted Permission sought to share information with : Case Manager, PCP                Emotional Assessment   Attitude/Demeanor/Rapport: Engaged Affect (typically observed): Accepting Orientation: : Oriented to Self, Oriented to Place, Oriented to  Time, Oriented to Situation   Psych Involvement: No (comment)  Admission diagnosis:  CHF (congestive heart failure) (HCC) [I50.9] Patient Active Problem List   Diagnosis Date Noted   CHF (congestive heart failure) (HCC) 09/29/2022   Tinea cruris 09/14/2022   Folliculitis 09/14/2022   Chest pain 09/11/2022   Hypotension 09/11/2022   Functional diarrhea 09/11/2022   Dizziness 09/10/2022   Coronary artery disease of native artery of native heart with stable angina pectoris (HCC) 03/12/2022   Mixed hyperlipidemia 03/12/2022   Excessive daytime sleepiness 10/31/2021   Ischemic cardiomyopathy 10/21/2021   Pulmonary hypertension (HCC) 10/21/2021   Demand ischemia , possible NSTEMI 10/21/2021   NSTEMI (non-ST elevated myocardial infarction) (HCC) 10/21/2021   Acute renal failure with acute tubular necrosis superimposed on stage 3b chronic kidney disease (HCC) 10/08/2021   Hypertensive urgency 10/07/2021   Chronic HFrEF (heart failure with reduced ejection fraction) (HCC) 10/07/2021   Hypotension  due to medication 09/15/2021   Hypertensive emergency 09/09/2021   Acute on chronic combined systolic and diastolic CHF (congestive heart failure) (HCC) 09/09/2021   Dyslipidemia 09/09/2021   Atypical pneumonia 08/05/2021   CKD (chronic kidney disease) stage 2, GFR 60-89 ml/min 08/05/2021   Essential hypertension 08/04/2021   Crohn's disease (HCC)  08/04/2021   Pain in joint of left shoulder 08/30/2017   Impingement syndrome of left shoulder region 08/30/2017   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 [place] [address] Phone: [phone] Fax: [phone]  [facility] [address] [place] - [address] [place] [address] Phone: [phone] Fax: [phone]  [facility] [place], [place] - [address] [place] [address] Phone: [phone] Fax: [phone]     Social Determinants of Health (SDOH) Social History: SDOH Screenings   Food Insecurity: No Food Insecurity (09/29/2022)  Housing: Patient Declined (09/29/2022)  Transportation Needs: No Transportation Needs (09/29/2022)  Utilities: Not At Risk (09/29/2022)  Depression (PHQ2-9): Low Risk  (09/18/2022)  Recent Concern: Depression (PHQ2-9) - High Risk (07/30/2022)  Financial Resource Strain: High Risk (01/29/2022)  Tobacco Use: Medium Risk (09/30/2022)   SDOH Interventions:     Readmission Risk Interventions     No data to display

## 2022-09-30 NOTE — Progress Notes (Deleted)
Office Visit    Patient Name: Clarence [name] Date of Encounter: 09/30/2022  Primary Care Provider:  Caesar Bookman, NP Primary Cardiologist:  Yvonne Kendall, MD  Chief Complaint    ***  Past Medical History    Past Medical History:  Diagnosis Date   Ascending Aortic Dilation    a. 09/2021 Echo: Asc Ao 40mm.   CAD (coronary artery disease)    a. 07/2021 Cath: LM nl, LAD 68m, D2 40, LCX 65p/m, OM1 90, RCA 184m w/ L-.R collats to RPDA from septal 1/2-->Med rx.   Chronic HFrEF (heart failure with reduced ejection fraction) (HCC)    a. 07/2021 Echo: EF 30-35%, glob HK, mod LVH, GrII DD, nl RV fxn, mildly dil LA, mild-mod MR; b. 09/2021 Echo: EF 35-40%, glob HK, mod LVH, GrI DD, nl RV fxn, mildly dil RA, mild MR, Asc Ao 40mm.   CKD (chronic kidney disease) stage 2, GFR 60-89 ml/min    Crohn's disease (HCC)    Hypertension    Mixed Ischemic & Nonischemic cardiomyopathy    a. 07/2021 Echo: EF 30-35%; b. 07/2021 Cath: Occluded RCA w/ mod LAD/LCX dzs, and severe OM1 dzs-->Med Rx; c. 09/2021 Echo: EF 35-40%.   PAH (pulmonary artery hypertension) (HCC)    a. 07/2021 RHC: PA 67/30 (42).   Proteinuria    Past Surgical History:  Procedure Laterality Date   COLECTOMY     RIGHT/LEFT HEART CATH AND CORONARY ANGIOGRAPHY N/A 08/07/2021   Procedure: RIGHT/LEFT HEART CATH AND CORONARY ANGIOGRAPHY;  Surgeon: Yvonne Kendall, MD;  Location: [facility] INVASIVE CV LAB;  Service: Cardiovascular;  Laterality: N/A;   RIGHT/LEFT HEART CATH AND CORONARY ANGIOGRAPHY N/A 09/29/2022   Procedure: RIGHT/LEFT HEART CATH AND CORONARY ANGIOGRAPHY;  Surgeon: Laurey Morale, MD;  Location: [facility] INVASIVE CV LAB;  Service: Cardiovascular;  Laterality: N/A;    Allergies  Allergies  Allergen Reactions   Ibuprofen Other (See Comments)    History of GIB with ibuprofen    Sulfacetamide Other (See Comments)   Neomycin-Bacitracin Zn-Polymyx     Other reaction(s): Other (See Comments)   Spironolactone     Hyperkalemia     History of Present Illness    ***  Home Medications    No current facility-administered medications for this visit.   No current outpatient medications on file.   Facility-Administered Medications Ordered in Other Visits  Medication Dose Route Frequency Provider Last Rate Last Admin   0.9 %  sodium chloride infusion  250 mL Intravenous PRN Laurey Morale, MD       acetaminophen (TYLENOL) tablet 650 mg  650 mg Oral Q4H PRN Laurey Morale, MD       acetaminophen (TYLENOL) tablet 650 mg  650 mg Oral QHS Clegg, Amy D, NP   650 mg at 09/29/22 2146   aspirin EC tablet 81 mg  81 mg Oral Daily Clegg, Amy D, NP   81 mg at 09/29/22 1547   atorvastatin (LIPITOR) tablet 80 mg  80 mg Oral QPM Clegg, Amy D, NP   80 mg at 09/29/22 1547   carvedilol (COREG) tablet 6.25 mg  6.25 mg Oral BID Clegg, Amy D, NP   6.25 mg at 09/29/22 2145   clopidogrel (PLAVIX) tablet 75 mg  75 mg Oral Daily Clegg, Amy D, NP   75 mg at 09/29/22 1547   empagliflozin (JARDIANCE) tablet 10 mg  10 mg Oral Daily Clegg, Amy D, NP   10 mg at 09/29/22 1546  enoxaparin (LOVENOX) injection 40 mg  40 mg Subcutaneous Q24H Laurey Morale, MD       ezetimibe (ZETIA) tablet 10 mg  10 mg Oral Daily Clegg, Amy D, NP   10 mg at 09/29/22 1547   famotidine (PEPCID) tablet 10 mg  10 mg Oral QHS Clegg, Amy D, NP       isosorbide mononitrate (IMDUR) 24 hr tablet 30 mg  30 mg Oral Daily Clegg, Amy D, NP   30 mg at 09/29/22 1546   nitroGLYCERIN (NITROSTAT) SL tablet 0.4 mg  0.4 mg Sublingual Q5 min PRN Clegg, Amy D, NP       ondansetron (ZOFRAN) injection 4 mg  4 mg Intravenous Q6H PRN Laurey Morale, MD       ondansetron [facility]) tablet 4 mg  4 mg Oral Q8H PRN Clegg, Amy D, NP       sacubitril-valsartan (ENTRESTO) 24-26 mg per tablet  1 tablet Oral BID Clegg, Amy D, NP   1 tablet at 09/29/22 2146   sodium chloride flush (NS) 0.9 % injection 3 mL  3 mL Intravenous Q12H Laurey Morale, MD   3 mL at 09/29/22 1945   sodium chloride  flush (NS) 0.9 % injection 3 mL  3 mL Intravenous PRN Laurey Morale, MD       spironolactone (ALDACTONE) tablet 12.5 mg  12.5 mg Oral Daily Clegg, Amy D, NP   12.5 mg at 09/29/22 1546     Review of Systems    ***.  All other systems reviewed and are otherwise negative except as noted above.    Physical Exam    VS:  There were no vitals taken for this visit. , BMI There is no height or weight on file to calculate BMI.     GEN: Well nourished, well developed, in no acute distress. HEENT: normal. Neck: Supple, no JVD, carotid bruits, or masses. Cardiac: RRR, no murmurs, rubs, or gallops. No clubbing, cyanosis, edema.  Radials 2+/PT 2+ and equal bilaterally.  Respiratory:  Respirations regular and unlabored, clear to auscultation bilaterally. GI: Soft, nontender, nondistended, BS + x 4. MS: no deformity or atrophy. Skin: warm and dry, no rash. Neuro:  Strength and sensation are intact. Psych: Normal affect.  Accessory Clinical Findings    ECG personally reviewed by me today - *** - no acute changes.  Lab Results  Component Value Date   WBC 7.0 09/30/2022   HGB 13.5 09/30/2022   HCT 40.3 09/30/2022   MCV 81.7 09/30/2022   PLT 175 09/30/2022   Lab Results  Component Value Date   CREATININE 1.56 (H) 09/30/2022   BUN 18 09/30/2022   NA 139 09/30/2022   K 3.8 09/30/2022   CL 109 09/30/2022   CO2 23 09/30/2022   Lab Results  Component Value Date   ALT 31 09/11/2022   AST 20 09/11/2022   ALKPHOS 65 09/11/2022   BILITOT 0.7 09/11/2022   Lab Results  Component Value Date   CHOL 125 09/21/2022   HDL 33 (L) 09/21/2022   LDLCALC 59 09/21/2022   LDLDIRECT 69.0 09/09/2021   TRIG 165 (H) 09/21/2022   CHOLHDL 3.8 09/21/2022    Lab Results  Component Value Date   HGBA1C 5.4 08/05/2021    Assessment & Plan    1.  ***   Nicolasa Ducking, NP 09/30/2022, 9:01 AM

## 2022-10-02 ENCOUNTER — Ambulatory Visit: Payer: Medicaid Other | Admitting: Family

## 2022-10-02 ENCOUNTER — Encounter: Payer: Self-pay | Admitting: Family

## 2022-10-02 VITALS — BP 122/76 | HR 63 | Temp 98.7°F | Resp 20 | Ht 70.0 in | Wt 226.4 lb

## 2022-10-02 DIAGNOSIS — I5022 Chronic systolic (congestive) heart failure: Secondary | ICD-10-CM | POA: Diagnosis not present

## 2022-10-02 DIAGNOSIS — I2510 Atherosclerotic heart disease of native coronary artery without angina pectoris: Secondary | ICD-10-CM

## 2022-10-02 DIAGNOSIS — I10 Essential (primary) hypertension: Secondary | ICD-10-CM

## 2022-10-02 LAB — CBC WITH DIFFERENTIAL/PLATELET
Lymphs Abs: 1665 cells/uL (ref 850–3900)
MCH: 28 pg (ref 27.0–33.0)
MPV: 10.8 fL (ref 7.5–12.5)
Neutrophils Relative %: 57.2 %
Platelets: 223 10*3/uL (ref 140–400)
RDW: 14.2 % (ref 11.0–15.0)
Total Lymphocyte: 27.3 %

## 2022-10-02 NOTE — Progress Notes (Unsigned)
Provider: Richarda Blade [name]-C  Jakobee Brackins, Donalee Citrin, NP  Patient Care Team: Elishia Kaczorowski, Donalee Citrin, NP as PCP - General (Family Medicine) End, Cristal Deer, MD as PCP - Cardiology (Cardiology) Laurey Morale, MD as PCP - Advanced Heart Failure (Cardiology)  Extended Emergency Contact Information Primary Emergency Contact: Stenzel,Yvonne Home Phone: [phone] Relation: Mother  Code Status:  Full Code  Goals of care: Advanced Directive information    09/29/2022    5:44 AM  Advanced Directives  Does Patient Have a Medical Advance Directive? No  Would patient like information on creating a medical advance directive? No - Patient declined     Chief Complaint  Patient presents with   Hospitalization Follow-up    Patient presents today for a hospital follow-up. He was admitted into [facility] hospital on 09/29/22-09/30/22 for Congestive heart failure (CHF)    HPI:  Pt is a 52 y.o. male seen today for an acute visit for hospitalization from 09/29/2022  - 09/30/2022 follow up for congestive Heart failure.He was admitted post Cath lab for observation because he did not have transportation home.Cath showed unchanged coronary disease with normal filling pressures.He was discharge home with same HF medication no changes. States still has intermittent chest pains sometimes last a minute to few minutes described as " tight like a bad Heart burn".  Has shortness of breath with walking " from the car to the mailbox like 50 steps" makes him shortness of breath.  States had appointment with cardiologist yesterday but was cancel since he was in the Hospital.Appointment was rescheduled to may 26 th 2024 with Dr.Mclean Dalton.  He denies any worsening leg edema or abrupt weight gain. States right had post cath site still sore but swelling has improved. No redness or drainage.   Past Medical History:  Diagnosis Date   Ascending Aortic Dilation    a. 09/2021 Echo: Asc Ao 40mm.   CAD (coronary artery  disease)    a. 07/2021 Cath: LM nl, LAD 13m, D2 40, LCX 65p/m, OM1 90, RCA 167m w/ L-.R collats to RPDA from septal 1/2-->Med rx.   Chronic HFrEF (heart failure with reduced ejection fraction) (HCC)    a. 07/2021 Echo: EF 30-35%, glob HK, mod LVH, GrII DD, nl RV fxn, mildly dil LA, mild-mod MR; b. 09/2021 Echo: EF 35-40%, glob HK, mod LVH, GrI DD, nl RV fxn, mildly dil RA, mild MR, Asc Ao 40mm.   CKD (chronic kidney disease) stage 2, GFR 60-89 ml/min    Crohn's disease (HCC)    Hypertension    Mixed Ischemic & Nonischemic cardiomyopathy    a. 07/2021 Echo: EF 30-35%; b. 07/2021 Cath: Occluded RCA w/ mod LAD/LCX dzs, and severe OM1 dzs-->Med Rx; c. 09/2021 Echo: EF 35-40%.   PAH (pulmonary artery hypertension) (HCC)    a. 07/2021 RHC: PA 67/30 (42).   Proteinuria    Past Surgical History:  Procedure Laterality Date   COLECTOMY     RIGHT/LEFT HEART CATH AND CORONARY ANGIOGRAPHY N/A 08/07/2021   Procedure: RIGHT/LEFT HEART CATH AND CORONARY ANGIOGRAPHY;  Surgeon: Yvonne Kendall, MD;  Location: [facility] INVASIVE CV LAB;  Service: Cardiovascular;  Laterality: N/A;   RIGHT/LEFT HEART CATH AND CORONARY ANGIOGRAPHY N/A 09/29/2022   Procedure: RIGHT/LEFT HEART CATH AND CORONARY ANGIOGRAPHY;  Surgeon: Laurey Morale, MD;  Location: [facility] INVASIVE CV LAB;  Service: Cardiovascular;  Laterality: N/A;    Allergies  Allergen Reactions   Ibuprofen Other (See Comments)    History of GIB with ibuprofen  Sulfacetamide Other (See Comments)   Neomycin-Bacitracin Zn-Polymyx     Other reaction(s): Other (See Comments)   Spironolactone     Hyperkalemia    Outpatient Encounter Medications as of 10/02/2022  Medication Sig   acetaminophen (TYLENOL) 325 MG tablet Take 650 mg by mouth at bedtime.   aspirin EC 81 MG tablet Take 1 tablet (81 mg total) by mouth daily. Swallow whole.   atorvastatin (LIPITOR) 80 MG tablet Take 1 tablet (80 mg total) by mouth every evening.   carvedilol (COREG) 6.25 MG tablet Take 1 tablet  (6.25 mg total) by mouth 2 (two) times daily.   clopidogrel (PLAVIX) 75 MG tablet Take 1 tablet (75 mg total) by mouth daily.   empagliflozin (JARDIANCE) 10 MG TABS tablet Take 10 mg by mouth daily.   ezetimibe (ZETIA) 10 MG tablet Take 1 tablet (10 mg total) by mouth daily.   famotidine (PEPCID) 10 MG tablet Take 10 mg by mouth at bedtime. As needed   fenofibrate (TRICOR) 48 MG tablet Take 1 tablet (48 mg total) by mouth daily.   furosemide (LASIX) 20 MG tablet Take 20 mg by mouth daily as needed. For weight gain of 3 lb in 24 hours or 5 lbs in one week   nitroGLYCERIN (NITROSTAT) 0.4 MG SL tablet Place 1 tablet (0.4 mg total) under the tongue every 5 (five) minutes as needed for chest pain.   ondansetron (ZOFRAN) 4 MG tablet Take 1 tablet (4 mg total) by mouth every 8 (eight) hours as needed for nausea or vomiting.   sacubitril-valsartan (ENTRESTO) 24-26 MG Take 1 tablet by mouth 2 (two) times daily.   spironolactone (ALDACTONE) 25 MG tablet Take 0.5 tablets (12.5 mg total) by mouth daily.   isosorbide mononitrate (IMDUR) 30 MG 24 hr tablet Take 30 mg by mouth daily. One tablet daily by mouth. (Patient not taking: Reported on 10/02/2022)   Facility-Administered Encounter Medications as of 10/02/2022  Medication   sodium chloride flush (NS) 0.9 % injection 3 mL    Review of Systems  Constitutional:  Negative for appetite change, chills, fatigue, fever and unexpected weight change.  HENT:  Negative for congestion, dental problem, ear discharge, ear pain, facial swelling, hearing loss, nosebleeds, postnasal drip, rhinorrhea, sinus pressure, sinus pain, sneezing, sore throat, tinnitus and trouble swallowing.   Eyes:  Negative for pain, discharge, redness, itching and visual disturbance.  Respiratory:  Negative for cough, chest tightness, shortness of breath and wheezing.        Intermittent shortness of breath with exertion   Cardiovascular:  Negative for chest pain, palpitations and leg  swelling.       Status post hospitalization post Cath lab.  Gastrointestinal:  Negative for abdominal distention, abdominal pain, blood in stool, constipation, diarrhea, nausea and vomiting.  Endocrine: Negative for cold intolerance, heat intolerance, polydipsia, polyphagia and polyuria.  Genitourinary:  Negative for difficulty urinating, dysuria, flank pain, frequency and urgency.  Musculoskeletal:  Negative for arthralgias, back pain, gait problem, joint swelling, myalgias, neck pain and neck stiffness.  Skin:  Negative for color change, pallor, rash and wound.  Neurological:  Negative for dizziness, syncope, speech difficulty, weakness, light-headedness, numbness and headaches.  Hematological:  Does not bruise/bleed easily.  Psychiatric/Behavioral:  Negative for agitation, behavioral problems, self-injury, sleep disturbance and suicidal ideas. The patient is not nervous/anxious.     Immunization History  Administered Date(s) Administered   Moderna SARS-COV2 Booster Vaccination 04/21/2020, 01/07/2021   Tdap 05/18/2013   Zoster Recombinat (Shingrix) 01/07/2021, 07/29/2022  Pertinent  Health Maintenance Due  Topic Date Due   INFLUENZA VACCINE  12/17/2022      10/29/2021   10:41 AM 01/11/2022   12:56 AM 07/15/2022    8:39 AM 07/28/2022    2:14 PM 09/18/2022    2:51 PM  Fall Risk  Falls in the past year? 0  0 0 0  Was there an injury with Fall? 0  0 0 0  Fall Risk Category Calculator 0  0 0 0  Fall Risk Category (Retired) Low      (RETIRED) Patient Fall Risk Level Low fall risk Low fall risk     Patient at Risk for Falls Due to   No Fall Risks  No Fall Risks  Fall risk Follow up Falls evaluation completed  Falls evaluation completed  Falls evaluation completed   Functional Status Survey:    Vitals:   10/02/22 1323  BP: 122/76  Pulse: 63  Resp: 20  Temp: 98.7 F (37.1 C)  SpO2: 95%  Weight: 226 lb 6.4 oz (102.7 kg)  Height: 5\' 10"  (1.778 m)   Body mass index is 32.49  kg/m. Physical Exam Vitals reviewed.  Constitutional:      General: He is not in acute distress.    Appearance: Normal appearance. He is obese. He is not ill-appearing or diaphoretic.  HENT:     Head: Normocephalic.     Mouth/Throat:     Mouth: Mucous membranes are moist.     Pharynx: Oropharynx is clear. No oropharyngeal exudate or posterior oropharyngeal erythema.  Eyes:     General: No scleral icterus.       Right eye: No discharge.        Left eye: No discharge.     Extraocular Movements: Extraocular movements intact.     Conjunctiva/sclera: Conjunctivae normal.     Pupils: Pupils are equal, round, and reactive to light.  Neck:     Vascular: No carotid bruit.  Cardiovascular:     Rate and Rhythm: Normal rate and regular rhythm.     Pulses: Normal pulses.     Heart sounds: Normal heart sounds. No murmur heard.    No friction rub. No gallop.  Pulmonary:     Effort: Pulmonary effort is normal. No respiratory distress.     Breath sounds: Normal breath sounds. No wheezing, rhonchi or rales.  Chest:     Chest wall: No tenderness.  Abdominal:     General: Bowel sounds are normal. There is no distension.     Palpations: Abdomen is soft. There is no mass.     Tenderness: There is no abdominal tenderness. There is no right CVA tenderness, left CVA tenderness, guarding or rebound.     Comments: Right lower quadrant colostomy bag patent   Musculoskeletal:        General: No swelling or tenderness. Normal range of motion.     Cervical back: Normal range of motion. No rigidity or tenderness.     Right lower leg: No edema.     Left lower leg: No edema.  Lymphadenopathy:     Cervical: No cervical adenopathy.  Skin:    General: Skin is warm and dry.     Coloration: Skin is not pale.     Findings: No bruising, erythema, lesion or rash.  Neurological:     Mental Status: He is alert and oriented to person, place, and time.     Cranial Nerves: No cranial nerve deficit.     Sensory:  No sensory deficit.     Motor: No weakness.     Coordination: Coordination normal.     Gait: Gait normal.  Psychiatric:        Mood and Affect: Mood normal.        Speech: Speech normal.        Behavior: Behavior normal.        Thought Content: Thought content normal.        Judgment: Judgment normal.    Labs reviewed: Recent Labs    10/11/21 0510 10/14/21 1057 10/20/21 2317 10/21/21 0850 09/10/22 1846 09/11/22 0541 09/21/22 1555 09/21/22 1852 09/29/22 0808 09/29/22 0809 09/29/22 1239 09/30/22 0102  NA 140   < >  --    < > 137   < > 141 141 141 141  --  139  K 4.7   < >  --    < > 5.3*   < > 5.2* 3.7 3.9 3.9  --  3.8  CL 108   < >  --    < > 109   < > 106 105  --   --   --  109  CO2 26   < >  --    < > 23   < > 28 27  --   --   --  23  GLUCOSE 100*   < >  --    < > 123*   < > 98 129*  --   --   --  111*  BUN 28*   < >  --    < > 36*   < > 29* 30*  --   --   --  18  CREATININE 1.38*   < >  --    < > 2.28*   < > 1.48* 1.56*  --   --  1.56* 1.56*  CALCIUM 9.5   < >  --    < > 9.8   < > 9.6 9.4  --   --   --  9.0  MG  --   --  2.0  --  2.2  --   --   --   --   --   --   --   PHOS 4.0  --   --   --   --   --   --   --   --   --   --   --    < > = values in this interval not displayed.   Recent Labs    01/27/22 1400 07/15/22 1425 09/07/22 1725 09/11/22 0541  AST 25 26 26 20   ALT 35 42 31 31  ALKPHOS 87  --  92 65  BILITOT 1.3* 0.6 0.8 0.7  PROT 6.7 7.0 7.6 6.5  ALBUMIN 3.6  --  4.0 3.4*   Recent Labs    10/14/21 1057 10/20/21 1943 07/15/22 1425 09/07/22 1725 09/10/22 1846 09/21/22 1852 09/29/22 0808 09/29/22 0809 09/29/22 1239 09/30/22 0102  WBC 6.0   < > 7.3 7.5   < > 8.1  --   --  5.7 7.0  NEUTROABS 3.8  --  4,636 5.0  --   --   --   --   --   --   HGB 13.2   < > 15.5 15.8   < > 14.3   < > 13.9 13.6 13.5  HCT 39.3   < > 46.1 47.6   < > 43.6   < >  41.0 39.7 40.3  MCV 83.8   < > 83.1 83.8   < > 84.0  --   --  81.7 81.7  PLT 176   < > 237 242   < > 222   --   --  160 175   < > = values in this interval not displayed.   Lab Results  Component Value Date   TSH 0.86 07/15/2022   Lab Results  Component Value Date   HGBA1C 5.4 08/05/2021   Lab Results  Component Value Date   CHOL 125 09/21/2022   HDL 33 (L) 09/21/2022   LDLCALC 59 09/21/2022   LDLDIRECT 69.0 09/09/2021   TRIG 165 (H) 09/21/2022   CHOLHDL 3.8 09/21/2022    Significant Diagnostic Results in last 30 days:  CARDIAC CATHETERIZATION  Result Date: 09/29/2022   Mid RCA lesion is 100% stenosed.   2nd Diag lesion is 40% stenosed.   1st Mrg lesion is 90% stenosed.   Mid LAD lesion is 50% stenosed.   Prox Cx to Mid Cx lesion is 60% stenosed. 1. Normal filling pressures. 2. Preserved cardiac output. 3. Coronaries unchanged from prior study.  Has CTO RCA with collaterals.  There is severe, diffuse disease of OM1 with nonobstructive disease in the LAD and LCx.  Continue medical management.   DG Chest 2 View  Result Date: 09/21/2022 CLINICAL DATA:  Chest tightness EXAM: CHEST - 2 VIEW COMPARISON:  X-ray 09/10/2022 and older FINDINGS: No consolidation, pneumothorax or effusion. No edema. Normal cardiopericardial silhouette. Air-fluid level along the stomach beneath the left hemidiaphragm IMPRESSION: No acute cardiopulmonary disease Electronically Signed   By: Karen Kays M.D.   On: 09/21/2022 19:17   ECHOCARDIOGRAM COMPLETE  Result Date: 09/11/2022    ECHOCARDIOGRAM REPORT   Patient Name:   JADIER CHAUSSE [facility] Date of Exam: 09/11/2022 Medical Rec #:  [id number]         Height:       70.0 in Accession #:    [id number]        Weight:       229.3 lb Date of Birth:  11-30-1970          BSA:          2.212 m Patient Age:    51 years          BP:           128/83 mmHg Patient Gender: M                 HR:           50 bpm. Exam Location:  [facility] Procedure: 2D Echo, Cardiac Doppler and Color Doppler Indications:     Acute ischemic heart disease, unspecified I24.9  History:         Patient has prior  history of Echocardiogram examinations, most                  recent 10/07/2021. CAD; Risk Factors:Hypertension.  Sonographer:     Cristela Blue Referring Phys:  [id number] Antonieta Iba Diagnosing Phys: Julien Nordmann MD IMPRESSIONS  1. Left ventricular ejection fraction, by estimation, is 45 to 50%. The left ventricle has mildly decreased function. The left ventricle demonstrates regional wall motion abnormalities (hypokinesis of the inferior and inferolateral wall). There is moderate left ventricular hypertrophy. Left ventricular diastolic parameters are consistent with Grade I diastolic dysfunction (impaired relaxation).  2. Right ventricular systolic function is normal. The right ventricular size is normal. Tricuspid  regurgitation signal is inadequate for assessing PA pressure.  3. The mitral valve is normal in structure. No evidence of mitral valve regurgitation. No evidence of mitral stenosis.  4. The aortic valve is tricuspid. Aortic valve regurgitation is not visualized. No aortic stenosis is present.  5. The inferior vena cava is normal in size with greater than 50% respiratory variability, suggesting right atrial pressure of 3 mmHg. FINDINGS  Left Ventricle: Left ventricular ejection fraction, by estimation, is 45 to 50%. The left ventricle has mildly decreased function. The left ventricle demonstrates regional wall motion abnormalities. The left ventricular internal cavity size was normal in size. There is moderate left ventricular hypertrophy. Left ventricular diastolic parameters are consistent with Grade I diastolic dysfunction (impaired relaxation). Right Ventricle: The right ventricular size is normal. No increase in right ventricular wall thickness. Right ventricular systolic function is normal. Tricuspid regurgitation signal is inadequate for assessing PA pressure. Left Atrium: Left atrial size was normal in size. Right Atrium: Right atrial size was normal in size. Pericardium: There is no evidence of  pericardial effusion. Mitral Valve: The mitral valve is normal in structure. No evidence of mitral valve regurgitation. No evidence of mitral valve stenosis. MV peak gradient, 2.7 mmHg. The mean mitral valve gradient is 1.0 mmHg. Tricuspid Valve: The tricuspid valve is normal in structure. Tricuspid valve regurgitation is not demonstrated. No evidence of tricuspid stenosis. Aortic Valve: The aortic valve is tricuspid. Aortic valve regurgitation is not visualized. No aortic stenosis is present. Aortic valve mean gradient measures 3.0 mmHg. Aortic valve peak gradient measures 4.4 mmHg. Aortic valve area, by VTI measures 3.28 cm. Pulmonic Valve: The pulmonic valve was normal in structure. Pulmonic valve regurgitation is not visualized. No evidence of pulmonic stenosis. Aorta: The aortic root is normal in size and structure. Venous: The inferior vena cava is normal in size with greater than 50% respiratory variability, suggesting right atrial pressure of 3 mmHg. IAS/Shunts: No atrial level shunt detected by color flow Doppler.  LEFT VENTRICLE PLAX 2D LVIDd:         5.10 cm      Diastology LVIDs:         4.20 cm      LV e' medial:    5.00 cm/s LV PW:         1.30 cm      LV E/e' medial:  10.3 LV IVS:        2.10 cm      LV e' lateral:   4.24 cm/s LVOT diam:     2.30 cm      LV E/e' lateral: 12.1 LV SV:         65 LV SV Index:   29 LVOT Area:     4.15 cm  LV Volumes (MOD) LV vol d, MOD A2C: 177.0 ml LV vol d, MOD A4C: 160.0 ml LV vol s, MOD A2C: 82.9 ml LV vol s, MOD A4C: 73.6 ml LV SV MOD A2C:     94.1 ml LV SV MOD A4C:     160.0 ml LV SV MOD BP:      97.2 ml RIGHT VENTRICLE RV Basal diam:  4.10 cm RV Mid diam:    3.00 cm RV S prime:     13.30 cm/s TAPSE (M-mode): 1.9 cm LEFT ATRIUM             Index        RIGHT ATRIUM           Index  LA diam:        3.30 cm 1.49 cm/m   RA Area:     16.40 cm LA Vol (A2C):   43.6 ml 19.71 ml/m  RA Volume:   44.50 ml  20.12 ml/m LA Vol (A4C):   31.8 ml 14.38 ml/m LA Biplane Vol:  39.6 ml 17.90 ml/m  AORTIC VALVE AV Area (Vmax):    2.90 cm AV Area (Vmean):   2.67 cm AV Area (VTI):     3.28 cm AV Vmax:           105.00 cm/s AV Vmean:          77.100 cm/s AV VTI:            0.199 m AV Peak Grad:      4.4 mmHg AV Mean Grad:      3.0 mmHg LVOT Vmax:         73.40 cm/s LVOT Vmean:        49.600 cm/s LVOT VTI:          0.157 m LVOT/AV VTI ratio: 0.79  AORTA Ao Root diam: 3.50 cm MITRAL VALVE MV Area (PHT): 2.52 cm    SHUNTS MV Area VTI:   1.91 cm    Systemic VTI:  0.16 m MV Peak grad:  2.7 mmHg    Systemic Diam: 2.30 cm MV Mean grad:  1.0 mmHg MV Vmax:       0.81 m/s MV Vmean:      49.2 cm/s MV Decel Time: 301 msec MV E velocity: 51.40 cm/s MV A velocity: 75.50 cm/s MV E/A ratio:  0.68 Julien Nordmann MD Electronically signed by Julien Nordmann MD Signature Date/Time: 09/11/2022/1:31:03 PM    Final    CT ABDOMEN PELVIS WO CONTRAST  Result Date: 09/10/2022 CLINICAL DATA:  Abdominal pain, acute, nonlocalized h/o crohn's, diarrhea, abd pain EXAM: CT ABDOMEN AND PELVIS WITHOUT CONTRAST TECHNIQUE: Multidetector CT imaging of the abdomen and pelvis was performed following the standard protocol without IV contrast. RADIATION DOSE REDUCTION: This exam was performed according to the departmental dose-optimization program which includes automated exposure control, adjustment of the mA and/or kV according to patient size and/or use of iterative reconstruction technique. COMPARISON:  10/10/2021 FINDINGS: Lower chest: No acute abnormality Hepatobiliary: Gallbladder contracted. No focal hepatic abnormality. Pancreas: No focal abnormality or ductal dilatation. Spleen: No focal abnormality.  Normal size. Adrenals/Urinary Tract: No adrenal abnormality. No focal renal abnormality. No stones or hydronephrosis. Urinary bladder is unremarkable. Stomach/Bowel: Prior colectomy. Residual Hartmann's pouch. Right lower quadrant ostomy. 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 No  bowel obstruction. Vascular/Lymphatic: No evidence of aneurysm or adenopathy. Reproductive: No visible focal abnormality. Other: No free fluid or free air. Musculoskeletal: No acute bony abnormality. IMPRESSION: No acute findings. Prior colectomy. 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 No bowel obstruction. No change since prior study. Electronically Signed   By: Charlett Nose M.D.   On: 09/10/2022 23:20   DG Chest 2 View  Result Date: 09/10/2022 CLINICAL DATA:  Dizziness. EXAM: CHEST - 2 VIEW COMPARISON:  October 20, 2021 FINDINGS: The heart size and mediastinal contours are within normal limits. Both lungs are clear. The visualized skeletal structures are unremarkable. IMPRESSION: No active cardiopulmonary disease. Electronically Signed   By: Aram Candela M.D.   On: 09/10/2022 19:25    Assessment/Plan 1. Essential hypertension Blood pressure well-controlled -Continue on spironolactone, Entresto, isosorbide mono nitrate, furosemide and carvedilol -Dietary  modification and exercise as tolerated - CBC with Differential/Platelet - Basic metabolic panel  2. Chronic HFrEF (heart failure with reduced ejection fraction) (HCC) Reports shortness of breath with exertion - No signs of fluid overload  - check weight at least three times per week and notify provider for any abrupt weight gain > 3 lbs  - Reduce salt intake in diet  -Continue on spironolactone, Entresto, isosorbide mono nitrate, Jardiance,furosemide and carvedilol - Basic metabolic panel - Brain Natriuretic Peptide -Follow-up with cardiology as scheduled  3. Coronary artery disease involving native coronary artery of native heart, unspecified whether angina present Status post Cath Lab Continues to report intermittent chest tightness described as " like bad heartburn". -Has not been taking Imdur.  Advised to start on Imdur to help with the symptoms. -Continue on aspirin, fenofibrate  and atorvastatin -Continue on nitro as needed -Continue to control high risk factors  Family/ staff Communication: Reviewed plan of care with patient verbalized understanding  Labs/tests ordered:  - Basic metabolic panel - Brain Natriuretic Peptide  Next Appointment: Return if symptoms worsen or fail to improve.   Caesar Bookman, NP

## 2022-10-03 LAB — CBC WITH DIFFERENTIAL/PLATELET
Absolute Monocytes: 750 cells/uL (ref 200–950)
Basophils Absolute: 43 cells/uL (ref 0–200)
Basophils Relative: 0.7 %
Eosinophils Absolute: 153 cells/uL (ref 15–500)
Eosinophils Relative: 2.5 %
HCT: 43.4 % (ref 38.5–50.0)
Hemoglobin: 14.6 g/dL (ref 13.2–17.1)
MCHC: 33.6 g/dL (ref 32.0–36.0)
MCV: 83.3 fL (ref 80.0–100.0)
Monocytes Relative: 12.3 %
Neutro Abs: 3489 cells/uL (ref 1500–7800)
RBC: 5.21 10*6/uL (ref 4.20–5.80)
WBC: 6.1 10*3/uL (ref 3.8–10.8)

## 2022-10-03 LAB — BASIC METABOLIC PANEL
BUN/Creatinine Ratio: 14 (calc) (ref 6–22)
BUN: 22 mg/dL (ref 7–25)
CO2: 28 mmol/L (ref 20–32)
Calcium: 9.9 mg/dL (ref 8.6–10.3)
Chloride: 107 mmol/L (ref 98–110)
Creat: 1.56 mg/dL — ABNORMAL HIGH (ref 0.70–1.30)
Glucose, Bld: 105 mg/dL (ref 65–139)
Potassium: 4.8 mmol/L (ref 3.5–5.3)
Sodium: 141 mmol/L (ref 135–146)

## 2022-10-03 LAB — BRAIN NATRIURETIC PEPTIDE: Brain Natriuretic Peptide: 78 pg/mL (ref <100)

## 2022-10-13 ENCOUNTER — Other Ambulatory Visit (HOSPITAL_COMMUNITY): Payer: Self-pay

## 2022-10-13 ENCOUNTER — Other Ambulatory Visit: Payer: Self-pay

## 2022-10-13 ENCOUNTER — Other Ambulatory Visit: Payer: Self-pay | Admitting: Family

## 2022-10-13 MED ORDER — FUROSEMIDE 20 MG PO TABS
20.0000 mg | ORAL_TABLET | Freq: Every day | ORAL | 1 refills | Status: DC | PRN
  Filled 2022-10-13: qty 90, 90d supply, fill #0
  Filled 2023-03-01: qty 90, 90d supply, fill #1

## 2022-10-13 MED ORDER — ENTRESTO 24-26 MG PO TABS
1.0000 | ORAL_TABLET | Freq: Two times a day (BID) | ORAL | 1 refills | Status: DC
  Filled 2022-10-13: qty 180, 90d supply, fill #0
  Filled 2022-11-09 – 2023-01-24 (×3): qty 180, 90d supply, fill #1

## 2022-10-13 MED ORDER — ISOSORBIDE MONONITRATE ER 30 MG PO TB24
30.0000 mg | ORAL_TABLET | Freq: Every day | ORAL | 1 refills | Status: DC
  Filled 2022-10-13 (×2): qty 90, 90d supply, fill #0

## 2022-10-13 NOTE — Telephone Encounter (Signed)
High risk or very high risk warning populated when attempting to refill medication. RX request sent to PCP for review and approval if warranted.   

## 2022-10-13 NOTE — Telephone Encounter (Signed)
From: [name] To: Office of Caesar Bookman, NP Sent: 10/13/2022 1:17 PM EDT Subject: Medication Renewal Request  Refills have been requested for the following medications:   furosemide (LASIX) 20 MG tablet   isosorbide mononitrate (IMDUR) 30 MG 24 hr tablet  Preferred pharmacy: [facility] [address] Delivery method: Daryll Drown

## 2022-10-14 ENCOUNTER — Encounter: Payer: Self-pay | Admitting: Cardiology

## 2022-10-14 ENCOUNTER — Telehealth (HOSPITAL_COMMUNITY): Payer: Self-pay

## 2022-10-14 ENCOUNTER — Ambulatory Visit (HOSPITAL_BASED_OUTPATIENT_CLINIC_OR_DEPARTMENT_OTHER): Payer: Medicaid Other | Admitting: Cardiology

## 2022-10-14 ENCOUNTER — Other Ambulatory Visit
Admission: RE | Admit: 2022-10-14 | Discharge: 2022-10-14 | Disposition: A | Discharge status: Home / Self Care | Payer: Medicaid Other | Source: Ambulatory Visit | Attending: Cardiology | Admitting: Cardiology

## 2022-10-14 VITALS — BP 106/70 | HR 77 | Wt 231.0 lb

## 2022-10-14 DIAGNOSIS — I5042 Chronic combined systolic (congestive) and diastolic (congestive) heart failure: Secondary | ICD-10-CM | POA: Insufficient documentation

## 2022-10-14 DIAGNOSIS — I5022 Chronic systolic (congestive) heart failure: Secondary | ICD-10-CM

## 2022-10-14 LAB — BASIC METABOLIC PANEL
Anion gap: 7 (ref 5–15)
BUN: 30 mg/dL — ABNORMAL HIGH (ref 6–20)
CO2: 24 mmol/L (ref 22–32)
Calcium: 9.3 mg/dL (ref 8.9–10.3)
Chloride: 111 mmol/L (ref 98–111)
Creatinine, Ser: 1.84 mg/dL — ABNORMAL HIGH (ref 0.61–1.24)
GFR, Estimated: 43 mL/min — ABNORMAL LOW (ref >60)
Glucose, Bld: 124 mg/dL — ABNORMAL HIGH (ref 70–99)
Potassium: 4.1 mmol/L (ref 3.5–5.1)
Sodium: 142 mmol/L (ref 135–145)

## 2022-10-14 MED ORDER — PANTOPRAZOLE SODIUM 40 MG PO TBEC: 40.0000 mg | DELAYED_RELEASE_TABLET | Freq: Every day | ORAL | 11 refills | Status: DC

## 2022-10-14 MED ORDER — CARVEDILOL 6.25 MG PO TABS: 9.3750 mg | ORAL_TABLET | Freq: Two times a day (BID) | ORAL | 5 refills | Status: DC

## 2022-10-14 NOTE — Telephone Encounter (Signed)
-----   Message from Laurey Morale, MD sent at 10/14/2022  3:32 PM EDT ----- Would increase hydration and do not take Lasix.  BMET in 1 week.

## 2022-10-14 NOTE — Addendum Note (Signed)
Addended by: Erling Conte on: 10/14/2022 02:04 PM   Modules accepted: Orders

## 2022-10-14 NOTE — Progress Notes (Signed)
PCP: Caesar Bookman, NP Cardiology: Dr. Okey Dupre HF Cardiology: Dr Shirlee Latch  52 y.o. with history of CAD, chronic systolic CHF, Crohns disease, CKD stage 3, and HTN was referred by Dr. Okey Dupre to HF clinic for evaluation of CHF.  Patient was admitted in 3/23 with newly found cardiomyopathy, CHF and atypical PNA.  Echo in 3/23 showed EF 30-35%, moderate LVH, normal RV. LHC showed occluded RCA with collaterals, 60% mLAD, 65% mLCx, 90% mid OM1.  RHC showed elevated filling pressures and low CI (2).  Patient was admitted again in 4/23 with CHF. He was admitted in 5/23 with hypertensive urgency and chest pain, CTA abdomen/pelvis did not show renal artery stenosis. He was admitted again in 6/23 with hypertensive urgency.  He quit smoking after 3/23 admission.  Rarely drinks ETOH and no drugs.  No strong family history of CAD or cardiomyopathy.    Sleep study was done, not suggestive of OSA.   He was admitted in 4/24 with chest pain => no evidence for ACS.  He had severe watery diarrhea via his ostomy and was dehydrated. Meds were held. Echo in 4/24 was improved, showing EF 45-50%, inferior and inferolateral hypokinesis, normal RV.   Patient continued to have chest pain episodes so LHC/RHC was done in 5/24 showing occluded mRCA with collaterals, 90% stenosis with severe diffuse disease in OM1. 50% mLAD, 60% proximal and mid LCx (unchanged from prior, no interventional target); normal filling pressures and preserved cardiac output.   Patient returns for followup of CHF and CAD.  He was started on ranolazine but was cutting it in half to be able to tolerate it.  He says that his PCP told him to stop it.  He does think that it helped with his chest pain when he was taking it. Patient continues to have chest pain episodes.  He will wake up at night with chest discomfort.  He has it on and off during the day as well.  He has a different type of chest pain, a central tightness, that he only gets with heavy exertion. He is  short of breath walking 150 feet or walking up stairs.   ECG (personally reviewed): NSR, old inferior MI, poor RWP   Labs (5/23): LDL 43, TGs 382 Labs (6/23): K 4.2, creatinine 1.16 Labs (7/23): K 4.2, creatinine 1.42, LDL 70, TGs 214 Labs (11/23): K 4.1, creatinine 1.27 Labs (2/24): LDL 81 Labs (5/24): K 4.9, creatinine 1.72 => 1.56, BNP 78, LDL 59, TGs 165  PMH: 1. Crohns disease: s/p colectomy with ostomy.  2. CKD stage 3: History of hyperkalemia.  3. HTN: CT abdomen/pelvis with no evidence for renal artery stenosis.  4. Pulmonary nodules: Stable on last CT.  5. CAD: LHC 3/23 with CTO mid RCA with collaterals, 60% mLAD, 65% mLCx, 90% mid OM1.  - LHC (5/24): occluded mRCA with collaterals, 90% stenosis with severe diffuse disease in OM1. 50% mLAD, 60% proximal and mid LCx (unchanged from prior, no interventional target) 6. Chronic systolic CHF: Ischemic cardiomyopathy (cannot fully rule out nonischemic component).  - Echo (3/23): EF 30-35%, moderate LVH, normal RV. - RHC (3/23): mean RA 16, PA 67/30 mean 42, mean PCWP 25, CI 2, PVR 4 - Echo (5/23): EF 35-40%, global HK, moderate LVH, RV normal, mild MR - Echo (4/24): EF 45-50%, inferior and inferolateral hypokinesis, normal RV.  - RHC (5/24): mean RA 4, PA 36/9, mean PCWP 10, CI 2.36, PVR 1.7 WU 7. Sleep study negative for OSA.   SH:  Single, quit smoking in 3/23, rare ETOH/no drugs, lives in [place] and works for a [employer].   FH: Uncles with "heart trouble"  ROS: All systems reviewed and negative except as per HPI.   Current Outpatient Medications  Medication Sig Dispense Refill   acetaminophen (TYLENOL) 325 MG tablet Take 650 mg by mouth at bedtime.     aspirin EC 81 MG tablet Take 1 tablet (81 mg total) by mouth daily. Swallow whole. 30 tablet 4   atorvastatin (LIPITOR) 80 MG tablet Take 1 tablet (80 mg total) by mouth every evening. 30 tablet 4   clopidogrel (PLAVIX) 75 MG tablet Take 1 tablet (75 mg total) by  mouth daily. 30 tablet 6   empagliflozin (JARDIANCE) 10 MG TABS tablet Take 10 mg by mouth daily.     ezetimibe (ZETIA) 10 MG tablet Take 1 tablet (10 mg total) by mouth daily. 90 tablet 3   famotidine (PEPCID) 10 MG tablet Take 10 mg by mouth at bedtime. As needed     fenofibrate (TRICOR) 48 MG tablet Take 1 tablet (48 mg total) by mouth daily. 30 tablet 8   furosemide (LASIX) 20 MG tablet Take 1 tablet (20 mg total) by mouth daily as needed. For weight gain of 3 lb in 24 hours or 5 lbs in one week 90 tablet 1   isosorbide mononitrate (IMDUR) 30 MG 24 hr tablet Take 1 tablet (30 mg total) by mouth daily. 90 tablet 1   nitroGLYCERIN (NITROSTAT) 0.4 MG SL tablet Place 1 tablet (0.4 mg total) under the tongue every 5 (five) minutes as needed for chest pain. 25 tablet 3   ondansetron (ZOFRAN) 4 MG tablet Take 1 tablet (4 mg total) by mouth every 8 (eight) hours as needed for nausea or vomiting. 20 tablet 0   pantoprazole (PROTONIX) 40 MG tablet Take 1 tablet (40 mg total) by mouth daily. 30 tablet 11   sacubitril-valsartan (ENTRESTO) 24-26 MG Take 1 tablet by mouth 2 (two) times daily. 180 tablet 1   spironolactone (ALDACTONE) 25 MG tablet Take 0.5 tablets (12.5 mg total) by mouth daily. 45 tablet 1   carvedilol (COREG) 6.25 MG tablet Take 1.5 tablets (9.375 mg total) by mouth 2 (two) times daily. 180 tablet 5   Current Facility-Administered Medications  Medication Dose Route Frequency Provider Last Rate Last Admin   sodium chloride flush (NS) 0.9 % injection 3 mL  3 mL Intravenous Q12H Laurey Morale, MD       Wt Readings from Last 3 Encounters:  10/14/22 231 lb (104.8 kg)  10/02/22 226 lb 6.4 oz (102.7 kg)  09/30/22 223 lb (101.2 kg)   BP 106/70   Pulse 77   Wt 231 lb (104.8 kg)   SpO2 97%   BMI 33.15 kg/m  General: NAD Neck: No JVD, no thyromegaly or thyroid nodule.  Lungs: Clear to auscultation bilaterally with normal respiratory effort. CV: Nondisplaced PMI.  Heart regular S1/S2,  no S3/S4, no murmur.  No peripheral edema.  No carotid bruit.  Normal pedal pulses.  Abdomen: Soft, nontender, no hepatosplenomegaly, no distention.  Skin: Intact without lesions or rashes.  Neurologic: Alert and oriented x 3.  Psych: Normal affect. Extremities: No clubbing or cyanosis.  HEENT: Normal.   Assessment/Plan: 1. Chronic systolic CHF: Suspect primarily ischemic cardiomyopathy but cannot rule out component of nonischemic cardiomyopathy. RHC in 3/23 with elevated filling pressures and low CI, 2. Echo in 5/23 with EF 35-40%, normal RV.  Echo in 4/24 with  EF up to 45-50%, inferior/inferolateral hypokinesis. Still with NYHA class II-III symptoms. Not volume overloaded on exam.   - Continue Entresto 24/26 bid, no room to increase with orthostatic symptoms.  - Using Lasix prn.  - I will increase Coreg to 9.375 mg bid, has potential to help with angina.   - Continue Jardiance 10 mg daily.  - With orthostatic symptoms, take spironolactone 12.5 at bedtime.  - EF is out of ICD range.  - BMET/BNP today.   2. CAD: Cath in 3/23 with CTO mid RCA with collaterals, 60% mLAD, 65% mLCx, 90% mid OM1.  Cath in 5/24 was very similar with occluded mRCA with collaterals, 90% stenosis with severe diffuse disease in OM1. 50% mLAD, 60% proximal and mid LCx.  He continues to have episodes of chest pain, both typical and atypical.  I think that the chest pain he gets at night in bed and at rest during the day is noncardiac, may be due to GERD.  He has a different type of chest pain that occurs with heavy exertion that I suspect is angina. On cath, he has no interventional target though suspect he could get some angina from the diffusely disease OM1 (occluded RCA has good collaterals).  - Continue Imdur 30 mg daily.  - Increase Coreg to 9.375 mg bid.  - I will have him restart ranolazine 500 mg bid for angina. This seemed to help when he took it before.  - Continue ASA 81 and Plavix 75 daily.  - Continue  atorvastatin and Zetia, good lipids in 5/24.  - Continue fenofibrate with elevated triglycerides.  - With suspected GERD, I will start Protonix 40 mg daily to see if this helps his nocturnal symptoms.  3. CKD: Stage 3.  Follow closely.    Followup in 6 wks.   Marca Ancona 10/14/2022

## 2022-10-14 NOTE — Patient Instructions (Signed)
Medication Changes:  INCREASE COREG to 9.375 mg (1 and a half tablet) twice a day   START protonix 40 mg Daily    *If you need a refill on your cardiac medications before your next appointment, please call your pharmacy*  Lab Work:  Labs done today, your results will be available in MyChart, we will contact you for abnormal readings.   Follow-Up in:   Your physician recommends that you schedule a follow-up appointment in: 6 weeks with Dr. Shirlee Latch   Do the following things EVERYDAY: Weigh yourself in the morning before breakfast. Write it down and keep it in a log. Take your medicines as prescribed Eat low salt foods--Limit salt (sodium) to 2000 mg per day.  Stay as active as you can everyday Limit all fluids for the day to less than 2 liters    Need to Contact us:  If you have any questions or concerns before your next appointment please send [place] a message through [place] or call our office at [phone]

## 2022-10-14 NOTE — Telephone Encounter (Signed)
Patient's labs has been ordered and his appointment scheduled. Pt aware, agreeable, and verbalized understanding  

## 2022-10-15 ENCOUNTER — Encounter: Payer: Medicaid Other | Attending: Internal Medicine

## 2022-10-15 DIAGNOSIS — I5042 Chronic combined systolic (congestive) and diastolic (congestive) heart failure: Secondary | ICD-10-CM | POA: Insufficient documentation

## 2022-10-19 ENCOUNTER — Encounter: Payer: Medicaid Other | Attending: Internal Medicine

## 2022-10-19 DIAGNOSIS — I5042 Chronic combined systolic (congestive) and diastolic (congestive) heart failure: Secondary | ICD-10-CM | POA: Insufficient documentation

## 2022-10-21 ENCOUNTER — Ambulatory Visit (HOSPITAL_COMMUNITY)
Admission: RE | Admit: 2022-10-21 | Discharge: 2022-10-21 | Disposition: A | Discharge status: Home / Self Care | Payer: Medicaid Other | Source: Ambulatory Visit | Attending: Cardiology | Admitting: Cardiology

## 2022-10-21 DIAGNOSIS — I5022 Chronic systolic (congestive) heart failure: Secondary | ICD-10-CM | POA: Diagnosis not present

## 2022-10-21 LAB — BASIC METABOLIC PANEL
Anion gap: 10 (ref 5–15)
BUN: 27 mg/dL — ABNORMAL HIGH (ref 6–20)
CO2: 22 mmol/L (ref 22–32)
Calcium: 9.6 mg/dL (ref 8.9–10.3)
Chloride: 107 mmol/L (ref 98–111)
Creatinine, Ser: 1.89 mg/dL — ABNORMAL HIGH (ref 0.61–1.24)
GFR, Estimated: 42 mL/min — ABNORMAL LOW (ref >60)
Glucose, Bld: 125 mg/dL — ABNORMAL HIGH (ref 70–99)
Potassium: 4.2 mmol/L (ref 3.5–5.1)
Sodium: 139 mmol/L (ref 135–145)

## 2022-10-22 ENCOUNTER — Telehealth: Payer: Self-pay | Admitting: *Deleted

## 2022-10-22 ENCOUNTER — Encounter: Payer: Self-pay | Admitting: *Deleted

## 2022-10-22 DIAGNOSIS — I5042 Chronic combined systolic (congestive) and diastolic (congestive) heart failure: Secondary | ICD-10-CM

## 2022-10-22 NOTE — Telephone Encounter (Signed)
Called to check on patient.  He has been out with chest pain and heart failure work up.  He has not attended since 09/07/22.

## 2022-10-28 ENCOUNTER — Encounter: Payer: Self-pay | Admitting: *Deleted

## 2022-10-28 ENCOUNTER — Other Ambulatory Visit (HOSPITAL_COMMUNITY): Payer: Self-pay | Admitting: Family Medicine

## 2022-10-28 DIAGNOSIS — I5042 Chronic combined systolic (congestive) and diastolic (congestive) heart failure: Secondary | ICD-10-CM

## 2022-10-28 NOTE — Progress Notes (Signed)
Pulmonary Individual Treatment Plan  Patient Details  Name: Clarence [name] MRN: [id number] Date of Birth: 1970-07-26 Referring Provider:   Flowsheet Row Pulmonary Rehab from 07/30/2022 in [facility] Cardiac and Pulmonary Rehab  Referring Provider Dr. Yvonne Kendall, MD       Initial Encounter Date:  Flowsheet Row Pulmonary Rehab from 07/30/2022 in [facility] Cardiac and Pulmonary Rehab  Date 07/30/22       Visit Diagnosis: Chronic combined systolic and diastolic congestive heart failure (HCC)  Patient's Home Medications on Admission:  Current Outpatient Medications:    acetaminophen (TYLENOL) 325 MG tablet, Take 650 mg by mouth at bedtime., Disp: , Rfl:    aspirin EC 81 MG tablet, Take 1 tablet (81 mg total) by mouth daily. Swallow whole., Disp: 30 tablet, Rfl: 4   atorvastatin (LIPITOR) 80 MG tablet, Take 1 tablet (80 mg total) by mouth every evening., Disp: 30 tablet, Rfl: 4   carvedilol (COREG) 6.25 MG tablet, Take 1.5 tablets (9.375 mg total) by mouth 2 (two) times daily., Disp: 180 tablet, Rfl: 5   clopidogrel (PLAVIX) 75 MG tablet, Take 1 tablet (75 mg total) by mouth daily., Disp: 30 tablet, Rfl: 6   empagliflozin (JARDIANCE) 10 MG TABS tablet, Take 10 mg by mouth daily., Disp: , Rfl:    ezetimibe (ZETIA) 10 MG tablet, Take 1 tablet (10 mg total) by mouth daily., Disp: 90 tablet, Rfl: 3   famotidine (PEPCID) 10 MG tablet, Take 10 mg by mouth at bedtime. As needed, Disp: , Rfl:    fenofibrate (TRICOR) 48 MG tablet, Take 1 tablet (48 mg total) by mouth daily., Disp: 30 tablet, Rfl: 8   furosemide (LASIX) 20 MG tablet, Take 1 tablet (20 mg total) by mouth daily as needed. For weight gain of 3 lb in 24 hours or 5 lbs in one week, Disp: 90 tablet, Rfl: 1   isosorbide mononitrate (IMDUR) 30 MG 24 hr tablet, Take 1 tablet (30 mg total) by mouth daily., Disp: 90 tablet, Rfl: 1   nitroGLYCERIN (NITROSTAT) 0.4 MG SL tablet, Place 1 tablet (0.4 mg total) under the tongue every 5 (five) minutes as  needed for chest pain., Disp: 25 tablet, Rfl: 3   ondansetron (ZOFRAN) 4 MG tablet, Take 1 tablet (4 mg total) by mouth every 8 (eight) hours as needed for nausea or vomiting., Disp: 20 tablet, Rfl: 0   pantoprazole (PROTONIX) 40 MG tablet, Take 1 tablet (40 mg total) by mouth daily., Disp: 30 tablet, Rfl: 11   sacubitril-valsartan (ENTRESTO) 24-26 MG, Take 1 tablet by mouth 2 (two) times daily., Disp: 180 tablet, Rfl: 1   spironolactone (ALDACTONE) 25 MG tablet, Take 0.5 tablets (12.5 mg total) by mouth daily., Disp: 45 tablet, Rfl: 1  Current Facility-Administered Medications:    sodium chloride flush (NS) 0.9 % injection 3 mL, 3 mL, Intravenous, Q12H, Laurey Morale, MD  Past Medical History: Past Medical History:  Diagnosis Date   Ascending Aortic Dilation    a. 09/2021 Echo: Asc Ao 40mm.   CAD (coronary artery disease)    a. 07/2021 Cath: LM nl, LAD 60m, D2 40, LCX 65p/m, OM1 90, RCA 147m w/ L-.R collats to RPDA from septal 1/2-->Med rx.   Chronic HFrEF (heart failure with reduced ejection fraction) (HCC)    a. 07/2021 Echo: EF 30-35%, glob HK, mod LVH, GrII DD, nl RV fxn, mildly dil LA, mild-mod MR; b. 09/2021 Echo: EF 35-40%, glob HK, mod LVH, GrI DD, nl RV fxn, mildly dil RA, mild  MR, Asc Ao 40mm.   CKD (chronic kidney disease) stage 2, GFR 60-89 ml/min    Crohn's disease (HCC)    Hypertension    Mixed Ischemic & Nonischemic cardiomyopathy    a. 07/2021 Echo: EF 30-35%; b. 07/2021 Cath: Occluded RCA w/ mod LAD/LCX dzs, and severe OM1 dzs-->Med Rx; c. 09/2021 Echo: EF 35-40%.   PAH (pulmonary artery hypertension) (HCC)    a. 07/2021 RHC: PA 67/30 (42).   Proteinuria     Tobacco Use: Social History   Tobacco Use  Smoking Status Former   Types: Cigars   Quit date: 08/13/2021   Years since quitting: 1.2  Smokeless Tobacco Not on file    Labs: Review Flowsheet  More data exists      Latest Ref Rng & Units 10/07/2021 01/27/2022 07/15/2022 09/21/2022 09/29/2022  Labs for ITP Cardiac  and Pulmonary Rehab  Cholestrol 0 - 200 mg/dL 161  096  045  409  -  LDL (calc) 0 - 99 mg/dL 43  70  81  59  -  HDL-C >40 mg/dL 30  35  39  33  -  Trlycerides <150 mg/dL 811  914  782  956  -  Bicarbonate 20.0 - 28.0 mmol/L - - - - 22.9  23.5   TCO2 22 - 32 mmol/L - - - - 24  25   Acid-base deficit 0.0 - 2.0 mmol/L - - - - 3.0  3.0   O2 Saturation % - - - - 71  68      Pulmonary Assessment Scores:  Pulmonary Assessment Scores     Row Name 07/28/22 1459 07/30/22 1109       ADL UCSD   ADL Phase Entry Entry    SOB Score total 54 54    Rest 5 5    Walk 3 3    Stairs 3 3    Bath 3 3    Dress 2 2    Shop 3 3      CAT Score   CAT Score 22 22      mMRC Score   mMRC Score -- 2             UCSD: Self-administered rating of dyspnea associated with activities of daily living (ADLs) 6-point scale (0 = "not at all" to 5 = "maximal or unable to do because of breathlessness")  Scoring Scores range from 0 to 120.  Minimally important difference is 5 units  CAT: CAT can identify the health impairment of COPD patients and is better correlated with disease progression.  CAT has a scoring range of zero to 40. The CAT score is classified into four groups of low (less than 10), medium (10 - 20), high (21-30) and very high (31-40) based on the impact level of disease on health status. A CAT score over 10 suggests significant symptoms.  A worsening CAT score could be explained by an exacerbation, poor medication adherence, poor inhaler technique, or progression of COPD or comorbid conditions.  CAT MCID is 2 points  mMRC: mMRC (Modified Medical Research Council) Dyspnea Scale is used to assess the degree of baseline functional disability in patients of respiratory disease due to dyspnea. No minimal important difference is established. A decrease in score of 1 point or greater is considered a positive change.   Pulmonary Function Assessment:   Exercise Target Goals: Exercise Program  Goal: Individual exercise prescription set using results from initial 6 min walk test and THRR while considering  patient's activity barriers and safety.   Exercise Prescription Goal: Initial exercise prescription builds to 30-45 minutes a day of aerobic activity, 2-3 days per week.  Home exercise guidelines will be given to patient during program as part of exercise prescription that the participant will acknowledge.  Education: Aerobic Exercise: - Group verbal and visual presentation on the components of exercise prescription. Introduces F.I.T.T principle from ACSM for exercise prescriptions.  Reviews F.I.T.T. principles of aerobic exercise including progression. Written material given at graduation. Flowsheet Row Pulmonary Rehab from 09/02/2022 in [facility] Cardiac and Pulmonary Rehab  Education need identified 07/30/22  Date 08/12/22  Educator KW  Instruction Review Code 1- [employer] Understanding       Education: Resistance Exercise: - Group verbal and visual presentation on the components of exercise prescription. Introduces F.I.T.T principle from ACSM for exercise prescriptions  Reviews F.I.T.T. principles of resistance exercise including progression. Written material given at graduation.    Education: Exercise & Equipment Safety: - Individual verbal instruction and demonstration of equipment use and safety with use of the equipment. Flowsheet Row Pulmonary Rehab from 09/02/2022 in [facility] Cardiac and Pulmonary Rehab  Date 07/30/22  Educator NT  Instruction Review Code 1- Verbalizes Understanding       Education: Exercise Physiology & General Exercise Guidelines: - Group verbal and written instruction with models to review the exercise physiology of the cardiovascular system and associated critical values. Provides general exercise guidelines with specific guidelines to those with heart or lung disease.  Flowsheet Row Pulmonary Rehab from 09/02/2022 in [facility] Cardiac and Pulmonary Rehab   Date 08/05/22  Educator KW  Instruction Review Code 1- [employer] Understanding       Education: Flexibility, Balance, Mind/Body Relaxation: - Group verbal and visual presentation with interactive activity on the components of exercise prescription. Introduces F.I.T.T principle from ACSM for exercise prescriptions. Reviews F.I.T.T. principles of flexibility and balance exercise training including progression. Also discusses the mind body connection.  Reviews various relaxation techniques to help reduce and manage stress (i.e. Deep breathing, progressive muscle relaxation, and visualization). Balance handout provided to take home. Written material given at graduation.   Activity Barriers & Risk Stratification:  Activity Barriers & Cardiac Risk Stratification - 07/30/22 1111       Activity Barriers & Cardiac Risk Stratification   Activity Barriers Joint Problems;Deconditioning;Shortness of Breath   left shoulder pain            6 Minute Walk:  6 Minute Walk     Row Name 07/30/22 1343         6 Minute Walk   Phase Initial     Distance 940 feet     Walk Time 6 minutes     # of Rest Breaks 0     MPH 1.78     METS 2.78     RPE 13     Perceived Dyspnea  2     VO2 Peak 9.73     Symptoms Yes (comment)     Comments SOB     Resting HR 61 bpm     Resting BP 104/62     Resting Oxygen Saturation  94 %     Exercise Oxygen Saturation  during 6 min walk 94 %     Max Ex. HR 90 bpm     Max Ex. BP 124/72     2 Minute Post BP 100/64       Interval HR   1 Minute HR 83     2  Minute HR 88     3 Minute HR 86     4 Minute HR 86     5 Minute HR 90     6 Minute HR 84     2 Minute Post HR 61     Interval Heart Rate? Yes       Interval Oxygen   Interval Oxygen? Yes     Baseline Oxygen Saturation % 94 %     1 Minute Oxygen Saturation % 94 %     1 Minute Liters of Oxygen 0 L  RA     2 Minute Liters of Oxygen 0 L  RA     3 Minute Oxygen Saturation % 94 %     3 Minute Liters of  Oxygen 0 L  RA     4 Minute Oxygen Saturation % 94 %     4 Minute Liters of Oxygen 0 L  RA     5 Minute Oxygen Saturation % 94 %     5 Minute Liters of Oxygen 0 L  RA     6 Minute Oxygen Saturation % 94 %     6 Minute Liters of Oxygen 0 L  RA     2 Minute Post Oxygen Saturation % 96 %     2 Minute Post Liters of Oxygen 0 L  RA             Oxygen Initial Assessment:  Oxygen Initial Assessment - 07/28/22 1416       Home Oxygen   Home Oxygen Device None    Sleep Oxygen Prescription None    Home Exercise Oxygen Prescription None    Home Resting Oxygen Prescription None    Compliance with Home Oxygen Use Yes      Intervention   Short Term Goals To learn and understand importance of maintaining oxygen saturations>88%;To learn and demonstrate proper pursed lip breathing techniques or other breathing techniques. ;To learn and understand importance of monitoring SPO2 with pulse oximeter and demonstrate accurate use of the pulse oximeter.    Long  Term Goals Maintenance of O2 saturations>88%;Compliance with respiratory medication;Verbalizes importance of monitoring SPO2 with pulse oximeter and return demonstration;Exhibits proper breathing techniques, such as pursed lip breathing or other method taught during program session             Oxygen Re-Evaluation:  Oxygen Re-Evaluation     Row Name 08/03/22 1540 08/17/22 1559           Program Oxygen Prescription   Program Oxygen Prescription -- None        Home Oxygen   Home Oxygen Device -- None      Sleep Oxygen Prescription -- None      Home Exercise Oxygen Prescription -- None      Home Resting Oxygen Prescription -- None        Goals/Expected Outcomes   Short Term Goals -- To learn and demonstrate proper pursed lip breathing techniques or other breathing techniques.       Long  Term Goals -- Exhibits proper breathing techniques, such as pursed lip breathing or other method taught during program session      Comments  Reviewed PLB technique with pt.  Talked about how it works and it's importance in maintaining their exercise saturations. Informed patient how to perform the Pursed Lipped breathing technique. Told patient to Inhale through the nose and out the mouth with pursed lips to keep their airways open, help oxygenate  them better, practice when at rest or doing strenuous activity. Patient Verbalizes understanding of technique and will work on and be reiterated during [facility].      Goals/Expected Outcomes Short: Become more profiecient at using PLB.   Long: Become independent at using PLB. Short: use PLB with exertion. Long: use PLB on exertion proficiently and independently.               Oxygen Discharge (Final Oxygen Re-Evaluation):  Oxygen Re-Evaluation - 08/17/22 1559       Program Oxygen Prescription   Program Oxygen Prescription None      Home Oxygen   Home Oxygen Device None    Sleep Oxygen Prescription None    Home Exercise Oxygen Prescription None    Home Resting Oxygen Prescription None      Goals/Expected Outcomes   Short Term Goals To learn and demonstrate proper pursed lip breathing techniques or other breathing techniques.     Long  Term Goals Exhibits proper breathing techniques, such as pursed lip breathing or other method taught during program session    Comments Informed patient how to perform the Pursed Lipped breathing technique. Told patient to Inhale through the nose and out the mouth with pursed lips to keep their airways open, help oxygenate them better, practice when at rest or doing strenuous activity. Patient Verbalizes understanding of technique and will work on and be reiterated during [facility].    Goals/Expected Outcomes Short: use PLB with exertion. Long: use PLB on exertion proficiently and independently.             Initial Exercise Prescription:  Initial Exercise Prescription - 07/30/22 1400       Date of Initial Exercise RX and Referring Provider    Date 07/30/22    Referring Provider Dr. Yvonne Kendall, MD      Oxygen   Maintain Oxygen Saturation 88% or higher      Treadmill   MPH 1.8    Grade 0.5    Minutes 15    METs 2.5      Recumbant Bike   Level 1    RPM 50    Watts 25    Minutes 15    METs 2.78      NuStep   Level 2    SPM 80    Minutes 15    METs 2.78      REL-XR   Level 2    Speed 50    Minutes 15    METs 2.78      Prescription Details   Frequency (times per week) 3    Duration Progress to 30 minutes of continuous aerobic without signs/symptoms of physical distress      Intensity   THRR 40-80% of Max Heartrate 103-145    Ratings of Perceived Exertion 11-13    Perceived Dyspnea 0-4      Progression   Progression Continue to progress workloads to maintain intensity without signs/symptoms of physical distress.      Resistance Training   Training Prescription Yes    Weight 4 lb    Reps 10-15             Perform Capillary Blood Glucose checks as needed.  Exercise Prescription Changes:   Exercise Prescription Changes     Row Name 07/30/22 1400 08/12/22 1400 08/27/22 1400 09/01/22 0800 09/09/22 1300     Response to Exercise   Blood Pressure (Admit) 104/62 102/60 110/68 -- 110/60   Blood Pressure (Exercise) 124/72  122/68 148/78 -- --   Blood Pressure (Exit) 100/64 98/60 124/78 -- 92/64   Heart Rate (Admit) 61 bpm 78 bpm 63 bpm -- 69 bpm   Heart Rate (Exercise) 90 bpm 102 bpm 94 bpm -- 86 bpm   Heart Rate (Exit) 61 bpm 83 bpm 76 bpm -- 73 bpm   Oxygen Saturation (Admit) 94 % 97 % 97 % -- 97 %   Oxygen Saturation (Exercise) 94 % 95 % 93 % -- 93 %   Oxygen Saturation (Exit) 96 % 95 % 95 % -- 94 %   Rating of Perceived Exertion (Exercise) 13 15 15  -- 15   Perceived Dyspnea (Exercise) 2 2 3  -- 2   Symptoms SOB SOB SOB -- SOB   Comments Results 3rd full day of exercise -- -- --   Duration -- Progress to 30 minutes of  aerobic without signs/symptoms of physical distress Continue with 30  min of aerobic exercise without signs/symptoms of physical distress. -- Continue with 30 min of aerobic exercise without signs/symptoms of physical distress.   Intensity -- THRR unchanged THRR unchanged -- THRR unchanged     Progression   Progression -- Continue to progress workloads to maintain intensity without signs/symptoms of physical distress. Continue to progress workloads to maintain intensity without signs/symptoms of physical distress. -- Continue to progress workloads to maintain intensity without signs/symptoms of physical distress.   Average METs -- 2.6 2.76 -- 2.33     Resistance Training   Training Prescription -- Yes Yes -- Yes   Weight -- 4 lb 4 lb -- 5 lb   Reps -- 10-15 10-15 -- 10-15     Interval Training   Interval Training -- No No -- No     Treadmill   MPH -- 1.8 1.9 -- 1.9   Grade -- 2 1.5 -- 0   Minutes -- 15 15 -- 15   METs -- 2.87 2.85 -- 2.45     NuStep   Level -- 2 2 -- --   Minutes -- 15 15 -- --   METs -- 2.5 2.3 -- --     Recumbant Elliptical   Level -- -- 1 -- --   Minutes -- -- 15 -- --   METs -- -- 1.4 -- --     Elliptical   Level -- -- -- -- 1   Minutes -- -- -- -- 15     REL-XR   Level -- 2 3 -- 4   Minutes -- 15 15 -- 15   METs -- 2.6 3.7 -- 2.6     T5 Nustep   Level -- 1 4 -- --   Minutes -- 15 15 -- --   METs -- 2.3 2.2 -- --     Home Exercise Plan   Plans to continue exercise at -- -- -- Home (comment)  walking Home (comment)  walking   Frequency -- -- -- Add 2 additional days to program exercise sessions. Add 2 additional days to program exercise sessions.   Initial Home Exercises Provided -- -- -- 08/31/22 08/31/22     Oxygen   Maintain Oxygen Saturation -- 88% or higher 88% or higher -- 88% or higher    Row Name 09/22/22 1500             Response to Exercise   Blood Pressure (Admit) 82/58       Blood Pressure (Exit) 84/60       Heart Rate (Admit)  64 bpm       Heart Rate (Exercise) 76 bpm       Heart Rate  (Exit) 67 bpm       Oxygen Saturation (Admit) 96 %       Oxygen Saturation (Exercise) 95 %       Oxygen Saturation (Exit) 96 %       Rating of Perceived Exertion (Exercise) 12       Symptoms SOB       Duration Continue with 30 min of aerobic exercise without signs/symptoms of physical distress.       Intensity THRR unchanged         Progression   Progression Continue to progress workloads to maintain intensity without signs/symptoms of physical distress.       Average METs 1.8         Resistance Training   Training Prescription Yes       Weight 5 lb       Reps 10-15         Interval Training   Interval Training No         T5 Nustep   Level 4       Minutes 30       METs 1.8         Home Exercise Plan   Plans to continue exercise at Home (comment)  walking       Frequency Add 2 additional days to program exercise sessions.       Initial Home Exercises Provided 08/31/22         Oxygen   Maintain Oxygen Saturation 88% or higher                Exercise Comments:   Exercise Comments     Row Name 08/03/22 1539           Exercise Comments First full day of exercise!  Patient was oriented to gym and equipment including functions, settings, policies, and procedures.  Patient's individual exercise prescription and treatment plan were reviewed.  All starting workloads were established based on the results of the 6 minute walk test done at initial orientation visit.  The plan for exercise progression was also introduced and progression will be customized based on patient's performance and goals.                Exercise Goals and Review:   Exercise Goals     Row Name 07/30/22 1112             Exercise Goals   Increase Physical Activity Yes       Intervention Provide advice, education, support and counseling about physical activity/exercise needs.;Develop an individualized exercise prescription for aerobic and resistive training based on initial evaluation  findings, risk stratification, comorbidities and participant's personal goals.       Expected Outcomes Short Term: Attend rehab on a regular basis to increase amount of physical activity.;Long Term: Add in home exercise to make exercise part of routine and to increase amount of physical activity.;Long Term: Exercising regularly at least 3-5 days a week.       Increase Strength and Stamina Yes       Intervention Provide advice, education, support and counseling about physical activity/exercise needs.;Develop an individualized exercise prescription for aerobic and resistive training based on initial evaluation findings, risk stratification, comorbidities and participant's personal goals.       Expected Outcomes Short Term: Increase workloads from initial exercise prescription for resistance, speed, and  METs.;Long Term: Improve cardiorespiratory fitness, muscular endurance and strength as measured by increased METs and functional capacity ( );Short Term: Perform resistance training exercises routinely during rehab and add in resistance training at home       Able to understand and use rate of perceived exertion (RPE) scale Yes       Intervention Provide education and explanation on how to use RPE scale       Expected Outcomes Short Term: Able to use RPE daily in rehab to express subjective intensity level;Long Term:  Able to use RPE to guide intensity level when exercising independently       Able to understand and use Dyspnea scale Yes       Intervention Provide education and explanation on how to use Dyspnea scale       Expected Outcomes Short Term: Able to use Dyspnea scale daily in rehab to express subjective sense of shortness of breath during exertion;Long Term: Able to use Dyspnea scale to guide intensity level when exercising independently       Knowledge and understanding of Target Heart Rate Range (THRR) Yes       Intervention Provide education and explanation of THRR including how the numbers  were predicted and where they are located for reference       Expected Outcomes Short Term: Able to state/look up THRR;Long Term: Able to use THRR to govern intensity when exercising independently;Short Term: Able to use daily as guideline for intensity in rehab       Able to check pulse independently Yes       Intervention Provide education and demonstration on how to check pulse in carotid and radial arteries.;Review the importance of being able to check your own pulse for safety during independent exercise       Expected Outcomes Short Term: Able to explain why pulse checking is important during independent exercise;Long Term: Able to check pulse independently and accurately       Understanding of Exercise Prescription Yes       Intervention Provide education, explanation, and written materials on patient's individual exercise prescription       Expected Outcomes Short Term: Able to explain program exercise prescription;Long Term: Able to explain home exercise prescription to exercise independently                Exercise Goals Re-Evaluation :  Exercise Goals Re-Evaluation     Row Name 08/03/22 1539 08/12/22 1426 08/27/22 1446 09/01/22 0822 09/09/22 1359     Exercise Goal Re-Evaluation   Exercise Goals Review Increase Physical Activity;Able to understand and use rate of perceived exertion (RPE) scale;Knowledge and understanding of Target Heart Rate Range (THRR);Understanding of Exercise Prescription;Increase Strength and Stamina;Able to check pulse independently;Able to understand and use Dyspnea scale Increase Physical Activity;Increase Strength and Stamina;Understanding of Exercise Prescription Increase Physical Activity;Increase Strength and Stamina;Understanding of Exercise Prescription Increase Physical Activity;Increase Strength and Stamina;Understanding of Exercise Prescription;Able to understand and use rate of perceived exertion (RPE) scale;Able to understand and use Dyspnea  scale;Knowledge and understanding of Target Heart Rate Range (THRR);Able to check pulse independently Increase Physical Activity;Increase Strength and Stamina;Understanding of Exercise Prescription   Comments Reviewed RPE scale, THR and program prescription with pt today.  Pt voiced understanding and was given a copy of goals to take home. Joshwa is off to a good start with his rehab sessions. He has been able to do his initial exercise prescription and even went up to 2% incline on the treadmill. He is  not quite hitting his THR but will continue to monitor that as he progresses in the program. Rasool continues to do well in rehab. He recently increased his overall average MET level to 2.76 METs. He also improved to level 4 on the T5 nustep and level 3 on the XR. He began using the recumbent elliptical at level 1 as well. He also increased his treadmill speed to 1.9 mph while maintaining an incline of 1.5%. We will continue to monitor his progress in the program. Reviewed home exercise with pt today.  Pt plans to walk at home for exercise.  He is going to build up to 30 min.  Reviewed THR, pulse, RPE, sign and symptoms, pulse oximetery and when to call 911 or MD.  Also discussed weather considerations and indoor options.  Pt voiced understanding. Roen continues to do well in rehab. He did take some incline off of his treadmill. Some days he experiences low BP and nausea and accommodates workloads based off of that. He did try out the elliptical one day and was able to do level 1. He started using 5 lb handweights as well! We will continue to monitor.   Expected Outcomes Short: Use RPE daily to regulate intensity.  Long: Follow program prescription in THR. Short: Continue initial exercise prescription Long: Increase overall strength and stamina Short: Continue to progressively increase treadmill workload. Long: Continue to improve strength and stamina. Short: Start to build up time walking at home Long; conitnue  to exercise indpendently Short: Slowly add incline back to treadmill when appropriate Long: Continue to increase overall MET level and stamina    Row Name 09/22/22 1503 10/06/22 1549 10/22/22 1511         Exercise Goal Re-Evaluation   Exercise Goals Review Increase Physical Activity;Increase Strength and Stamina;Understanding of Exercise Prescription Increase Physical Activity;Increase Strength and Stamina;Understanding of Exercise Prescription Increase Physical Activity;Increase Strength and Stamina;Understanding of Exercise Prescription     Comments Chinmay has only attended rehab once since the last review due to issues with low BP and chest pain. During his one session he was able to work at level 4 on the T5 nustep for 30 minutes. He has a heart cath scheduled for 09/29/2022. He plans to return after this procedure. We will continue to monitor his progress when he returns to the program. Aybel has been placed on a medical hold as he has been getting evaluated for symptoms with testing including a heart cath. RN spoke with patient yesterday who advised patient to speak with his heart failure doctor at his f/u appt on 5/29 to seek clearance to return to rehab. Per patient, he has still been experiencing symptoms and therefore we need his MD to state whether or not he is cleared to exercise. We recently called to check on patient. He has been out on medical hold with chest pain and heart failure work up. He has not attended since 09/07/22 and will need clearance to return. We will continue to monitor his progress when he returns to the program.     Expected Outcomes Short: Return to rehab when appropriate. Long: Continue to improve strength and stamina. Short: Talk to doctor and receive clearance to exercise Long: Graduate from [employer] Short: Talk to doctor and receive clearance to exercise. Long: Graduate from [employer].              Discharge Exercise Prescription (Final Exercise Prescription  Changes):  Exercise Prescription Changes - 09/22/22 1500  Response to Exercise   Blood Pressure (Admit) 82/58    Blood Pressure (Exit) 84/60    Heart Rate (Admit) 64 bpm    Heart Rate (Exercise) 76 bpm    Heart Rate (Exit) 67 bpm    Oxygen Saturation (Admit) 96 %    Oxygen Saturation (Exercise) 95 %    Oxygen Saturation (Exit) 96 %    Rating of Perceived Exertion (Exercise) 12    Symptoms SOB    Duration Continue with 30 min of aerobic exercise without signs/symptoms of physical distress.    Intensity THRR unchanged      Progression   Progression Continue to progress workloads to maintain intensity without signs/symptoms of physical distress.    Average METs 1.8      Resistance Training   Training Prescription Yes    Weight 5 lb    Reps 10-15      Interval Training   Interval Training No      T5 Nustep   Level 4    Minutes 30    METs 1.8      Home Exercise Plan   Plans to continue exercise at Home (comment)   walking   Frequency Add 2 additional days to program exercise sessions.    Initial Home Exercises Provided 08/31/22      Oxygen   Maintain Oxygen Saturation 88% or higher             Nutrition:  Target Goals: Understanding of nutrition guidelines, daily intake of sodium 1500mg , cholesterol 200mg , calories 30% from fat and 7% or less from saturated fats, daily to have 5 or more servings of fruits and vegetables.  Education: All About Nutrition: -Group instruction provided by verbal, written material, interactive activities, discussions, models, and posters to present general guidelines for heart healthy nutrition including fat, fiber, MyPlate, the role of sodium in heart healthy nutrition, utilization of the nutrition label, and utilization of this knowledge for meal planning. Follow up email sent as well. Written material given at graduation. Flowsheet Row Pulmonary Rehab from 09/02/2022 in [facility] Cardiac and Pulmonary Rehab  Education need identified  07/30/22  Date 09/02/22  [part 2]  Educator KW  Instruction Review Code 1- Verbalizes Understanding       Biometrics:  Pre Biometrics - 07/30/22 1112       Pre Biometrics   Height 5' 9.75" (1.772 m)    Weight 225 lb 9.6 oz (102.3 kg)    Waist Circumference 43 inches    Hip Circumference 44 inches    Waist to Hip Ratio 0.98 %    BMI (Calculated) 32.59    Single Leg Stand 30 seconds              Nutrition Therapy Plan and Nutrition Goals:  Nutrition Therapy & Goals - 07/30/22 1038       Nutrition Therapy   Diet Heart healthy, low Na    Drug/Food Interactions Statins/Certain Fruits    Protein (specify units) 80-85g    Fiber 35 grams    Whole Grain Foods 3 servings    Saturated Fats 16 max. grams    Fruits and Vegetables 8 servings/day    Sodium 2 grams      Personal Nutrition Goals   Nutrition Goal ST: practice MyPlate guidelines, practice reading food labels  LT: limit Na <2g/day, limit saturated fat <16g/day, aim for 30-35g of fiber per day    Comments 52 y.o. M admitted to pulmonary rehab for Chronic combined systolic and  diastolic congestive heart failure. PMHx includes CAD, CKD stg 2, crohn's disease, HTN, pulmonary artery hypertension. Reviewed relevant medications atorvastatin, jardiance, zetia, pepcid, furosemide. Reviewed most recent labs. Neuman reports that his Crohns has been well controlled and his main trigger foods are certain types of processed meats; eating smaller/more frequent meals help to manage his symptoms as well. Cheng reports limiting chinese food, pizza, and fried foods. He has been trying to use a grill for his cooking to avoid lots of added fat or grease. He has been trying to cut back on soda and now only has it occassionally. Discussed food patterns and heart healthy eating as well as MyPlate. Encouagred him to read labels, choose liquid cooking oils, and to include protein, healthy fat, and fiber at meals to make them more nutrient dense and  satiating. Discussed CKD stg 2 MNT and how a modest protei is recommeneded (0.8 g/kg/d) - encouraged to be mindful of animal protein portions and to include plant protein.      Intervention Plan   Intervention Prescribe, educate and counsel regarding individualized specific dietary modifications aiming towards targeted core components such as weight, hypertension, lipid management, diabetes, heart failure and other comorbidities.;Nutrition handout(s) given to patient.    Expected Outcomes Short Term Goal: Understand basic principles of dietary content, such as calories, fat, sodium, cholesterol and nutrients.;Short Term Goal: A plan has been developed with personal nutrition goals set during dietitian appointment.;Long Term Goal: Adherence to prescribed nutrition plan.             Nutrition Assessments:  MEDIFICTS Score Key: ?70 Need to make dietary changes  40-70 Heart Healthy Diet ? 40 Therapeutic Level Cholesterol Diet  Flowsheet Row Pulmonary Rehab from 07/28/2022 in [facility] Cardiac and Pulmonary Rehab  Picture Your Plate Total Score on Admission 51      Picture Your Plate Scores: <16 Unhealthy dietary pattern with much room for improvement. 41-50 Dietary pattern unlikely to meet recommendations for good health and room for improvement. 51-60 More healthful dietary pattern, with some room for improvement.  >60 Healthy dietary pattern, although there may be some specific behaviors that could be improved.   Nutrition Goals Re-Evaluation:  Nutrition Goals Re-Evaluation     Row Name 08/17/22 1602             Goals   Current Weight 230 lb (104.3 kg)       Nutrition Goal Lose some weight       Comment Patient was informed on why it is important to maintain a balanced diet when dealing with Respiratory issues. Explained that it takes a lot of energy to breath and when they are short of breath often they will need to have a good diet to help keep up with the calories they are  expending for breathing.       Expected Outcome Short: Choose and plan snacks accordingly to patients caloric intake to improve breathing. Long: Maintain a diet independently that meets their caloric intake to aid in daily shortness of breath.                Nutrition Goals Discharge (Final Nutrition Goals Re-Evaluation):  Nutrition Goals Re-Evaluation - 08/17/22 1602       Goals   Current Weight 230 lb (104.3 kg)    Nutrition Goal Lose some weight    Comment Patient was informed on why it is important to maintain a balanced diet when dealing with Respiratory issues. Explained that it takes a lot of  energy to breath and when they are short of breath often they will need to have a good diet to help keep up with the calories they are expending for breathing.    Expected Outcome Short: Choose and plan snacks accordingly to patients caloric intake to improve breathing. Long: Maintain a diet independently that meets their caloric intake to aid in daily shortness of breath.             Psychosocial: Target Goals: Acknowledge presence or absence of significant depression and/or stress, maximize coping skills, provide positive support system. Participant is able to verbalize types and ability to use techniques and skills needed for reducing stress and depression.   Education: Stress, Anxiety, and Depression - Group verbal and visual presentation to define topics covered.  Reviews how body is impacted by stress, anxiety, and depression.  Also discusses healthy ways to reduce stress and to treat/manage anxiety and depression.  Written material given at graduation.   Education: Sleep Hygiene -Provides group verbal and written instruction about how sleep can affect your health.  Define sleep hygiene, discuss sleep cycles and impact of sleep habits. Review good sleep hygiene tips.    Initial Review & Psychosocial Screening:  Initial Psych Review & Screening - 07/28/22 1349       Initial  Review   Current issues with Current Stress Concerns    Source of Stress Concerns Chronic Illness      Family Dynamics   Good Support System? Yes   mother     Barriers   Psychosocial barriers to participate in program There are no identifiable barriers or psychosocial needs.;The patient should benefit from training in stress management and relaxation.      Screening Interventions   Interventions To provide support and resources with identified psychosocial needs;Encouraged to exercise;Provide feedback about the scores to participant    Expected Outcomes Short Term goal: Utilizing psychosocial counselor, staff and physician to assist with identification of specific Stressors or current issues interfering with healing process. Setting desired goal for each stressor or current issue identified.;Long Term Goal: Stressors or current issues are controlled or eliminated.;Short Term goal: Identification and review with participant of any Quality of Life or Depression concerns found by scoring the questionnaire.;Long Term goal: The participant improves quality of Life and PHQ9 Scores as seen by post scores and/or verbalization of changes             Quality of Life Scores:  Scores of 19 and below usually indicate a poorer quality of life in these areas.  A difference of  2-3 points is a clinically meaningful difference.  A difference of 2-3 points in the total score of the Quality of Life Index has been associated with significant improvement in overall quality of life, self-image, physical symptoms, and general health in studies assessing change in quality of life.  PHQ-9: Review Flowsheet  More data exists      09/18/2022 08/17/2022 07/30/2022 07/15/2022 10/29/2021  Depression screen PHQ 2/9  Decreased Interest 0 0 3 0 0  Down, Depressed, Hopeless 0 0 0 0 0  PHQ - 2 Score 0 0 3 0 0  Altered sleeping - 0 3 0 -  Tired, decreased energy - 1 3 0 -  Change in appetite - 3 2 0 -  Feeling bad or  failure about yourself  - 0 0 0 -  Trouble concentrating - 0 0 0 -  Moving slowly or fidgety/restless - 0 0 0 -  Suicidal thoughts -  0 0 0 -  PHQ-9 Score - 4 11 0 -  Difficult doing work/chores - Not difficult at all Very difficult Not difficult at all -   Interpretation of Total Score  Total Score Depression Severity:  1-4 = Minimal depression, 5-9 = Mild depression, 10-14 = Moderate depression, 15-19 = Moderately severe depression, 20-27 = Severe depression   Psychosocial Evaluation and Intervention:  Psychosocial Evaluation - 07/28/22 1418       Psychosocial Evaluation & Interventions   Interventions Stress management education;Relaxation education;Encouraged to exercise with the program and follow exercise prescription    Comments Jauron is coming to Pulmonary Rehab for combined heart failure. His most recent EF is 35-40%. He reports having two heart attacks in the past. 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 He is living with his mother since his heart attack. He is hesitant to exercise or get back to his normal routine because he is scared about his heart and doesn't want anything to happen to him for his mother's sake. He mentioned that if he goes out, he always makes sure he is close to a hospital in case something happens. He enjoys talking about his job, but doesn't know if he will be able to do all that is required physically. 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 He wants to come to the program to work on his strength and stamina. He also wants more education on what is going on with his heart and his prognosis.    Expected Outcomes Short: attend pulmonary rehab for education and exercise. Long: develop and maintain positive self care habits.    Continue Psychosocial Services  Follow up required by staff             Psychosocial Re-Evaluation:   Psychosocial Re-Evaluation     Row Name 08/17/22 1604             Psychosocial Re-Evaluation   Current issues with Current Stress Concerns       Comments Reviewed patient health questionnaire (PHQ-9) with patient for follow up. Previously, patients score indicated signs/symptoms of depression.  Reviewed to see if patient is improving symptom wise while in program.  Score improved and patient states that it is because he has been able to have more energy.       Expected Outcomes Short: Continue to attend [facility] regularly for regular exercise and social engagement. Long: Continue to improve symptoms and manage a positive mental state.       Continue Psychosocial Services  Follow up required by staff                Psychosocial Discharge (Final Psychosocial Re-Evaluation):  Psychosocial Re-Evaluation - 08/17/22 1604       Psychosocial Re-Evaluation   Current issues with Current Stress Concerns    Comments Reviewed patient health questionnaire (PHQ-9) with patient for follow up. Previously, patients score indicated signs/symptoms of depression.  Reviewed to see if patient is improving symptom wise while in program.  Score improved and patient states that it is because he has been able to have more energy.    Expected Outcomes Short: Continue to attend [facility] regularly for regular exercise and social engagement. Long: Continue to improve symptoms and manage a positive mental state.    Continue Psychosocial Services  Follow up required by staff  Education: Education Goals: Education classes will be provided on a weekly basis, covering required topics. Participant will state understanding/return demonstration of topics presented.  Learning Barriers/Preferences:  Learning Barriers/Preferences - 07/28/22 1335       Learning Barriers/Preferences   Learning Barriers None    Learning Preferences None             General Pulmonary Education  Topics:  Infection Prevention: - Provides verbal and written material to individual with discussion of infection control including proper hand washing and proper equipment cleaning during exercise session. Flowsheet Row Pulmonary Rehab from 09/02/2022 in [facility] Cardiac and Pulmonary Rehab  Date 07/30/22  Educator NT  Instruction Review Code 1- Verbalizes Understanding       Falls Prevention: - Provides verbal and written material to individual with discussion of falls prevention and safety. Flowsheet Row Pulmonary Rehab from 09/02/2022 in [facility] Cardiac and Pulmonary Rehab  Date 07/30/22  Educator NT  Instruction Review Code 1- Verbalizes Understanding       Chronic Lung Disease Review: - Group verbal instruction with posters, models, PowerPoint presentations and videos,  to review new updates, new respiratory medications, new advancements in procedures and treatments. Providing information on websites and "800" numbers for continued self-education. Includes information about supplement oxygen, available portable oxygen systems, continuous and intermittent flow rates, oxygen safety, concentrators, and Medicare reimbursement for oxygen. Explanation of Pulmonary Drugs, including class, frequency, complications, importance of spacers, rinsing mouth after steroid MDI's, and proper cleaning methods for nebulizers. Review of basic lung anatomy and physiology related to function, structure, and complications of lung disease. Review of risk factors. Discussion about methods for diagnosing sleep apnea and types of masks and machines for OSA. Includes a review of the use of types of environmental controls: home humidity, furnaces, filters, dust mite/pet prevention, HEPA vacuums. Discussion about weather changes, air quality and the benefits of nasal washing. Instruction on Warning signs, infection symptoms, calling MD promptly, preventive modes, and value of vaccinations. Review of effective airway clearance,  coughing and/or vibration techniques. Emphasizing that all should Create an Action Plan. Written material given at graduation.   AED/CPR: - Group verbal and written instruction with the use of models to demonstrate the basic use of the AED with the basic ABC's of resuscitation.    Anatomy and Cardiac Procedures: - Group verbal and visual presentation and models provide information about basic cardiac anatomy and function. Reviews the testing methods done to diagnose heart disease and the outcomes of the test results. Describes the treatment choices: Medical Management, Angioplasty, or Coronary Bypass Surgery for treating various heart conditions including Myocardial Infarction, Angina, Valve Disease, and Cardiac Arrhythmias.  Written material given at graduation. Flowsheet Row Pulmonary Rehab from 09/02/2022 in [facility] Cardiac and Pulmonary Rehab  Education need identified 07/30/22       Medication Safety: - Group verbal and visual instruction to review commonly prescribed medications for heart and lung disease. Reviews the medication, class of the drug, and side effects. Includes the steps to properly store meds and maintain the prescription regimen.  Written material given at graduation. Flowsheet Row Pulmonary Rehab from 09/02/2022 in [facility] Cardiac and Pulmonary Rehab  Education need identified 07/30/22       Other: -Provides group and verbal instruction on various topics (see comments)   Knowledge Questionnaire Score:  Knowledge Questionnaire Score - 07/28/22 1458       Knowledge Questionnaire Score   Pre Score 15/26  Core Components/Risk Factors/Patient Goals at Admission:  Personal Goals and Risk Factors at Admission - 07/30/22 1110       Core Components/Risk Factors/Patient Goals on Admission    Weight Management Yes;Weight Loss    Intervention Weight Management: Develop a combined nutrition and exercise program designed to reach desired caloric intake,  while maintaining appropriate intake of nutrient and fiber, sodium and fats, and appropriate energy expenditure required for the weight goal.;Weight Management: Provide education and appropriate resources to help participant work on and attain dietary goals.;Weight Management/Obesity: Establish reasonable short term and long term weight goals.    Admit Weight 225 lb 9.6 oz (102.3 kg)    Goal Weight: Short Term 215 lb (97.5 kg)    Goal Weight: Long Term 195 lb (88.5 kg)    Expected Outcomes Weight Loss: Understanding of general recommendations for a balanced deficit meal plan, which promotes 1-2 lb weight loss per week and includes a negative energy balance of [phone] kcal/d;Short Term: Continue to assess and modify interventions until short term weight is achieved;Long Term: Adherence to nutrition and physical activity/exercise program aimed toward attainment of established weight goal;Understanding recommendations for meals to include 15-35% energy as protein, 25-35% energy from fat, 35-60% energy from carbohydrates, less than 200mg  of dietary cholesterol, 20-35 gm of total fiber daily;Understanding of distribution of calorie intake throughout the day with the consumption of 4-5 meals/snacks    Heart Failure Yes    Intervention Provide a combined exercise and nutrition program that is supplemented with education, support and counseling about heart failure. Directed toward relieving symptoms such as shortness of breath, decreased exercise tolerance, and extremity edema.    Expected Outcomes Improve functional capacity of life;Short term: Attendance in program 2-3 days a week with increased exercise capacity. Reported lower sodium intake. Reported increased fruit and vegetable intake. Reports medication compliance.;Long term: Adoption of self-care skills and reduction of barriers for early signs and symptoms recognition and intervention leading to self-care maintenance.;Short term: Daily weights obtained and  reported for increase. Utilizing diuretic protocols set by physician.    Hypertension Yes    Intervention Provide education on lifestyle modifcations including regular physical activity/exercise, weight management, moderate sodium restriction and increased consumption of fresh fruit, vegetables, and low fat dairy, alcohol moderation, and smoking cessation.;Monitor prescription use compliance.    Expected Outcomes Long Term: Maintenance of blood pressure at goal levels.;Short Term: Continued assessment and intervention until BP is < 140/43mm HG in hypertensive participants. < 130/65mm HG in hypertensive participants with diabetes, heart failure or chronic kidney disease.    Lipids Yes    Intervention Provide education and support for participant on nutrition & aerobic/resistive exercise along with prescribed medications to achieve LDL 70mg , HDL >40mg .    Expected Outcomes Short Term: Participant states understanding of desired cholesterol values and is compliant with medications prescribed. Participant is following exercise prescription and nutrition guidelines.;Long Term: Cholesterol controlled with medications as prescribed, with individualized exercise RX and with personalized nutrition plan. Value goals: LDL < 70mg , HDL > 40 mg.             Education:Diabetes - Individual verbal and written instruction to review signs/symptoms of diabetes, desired ranges of glucose level fasting, after meals and with exercise. Acknowledge that pre and post exercise glucose checks will be done for 3 sessions at entry of program.   Know Your Numbers and Heart Failure: - Group verbal and visual instruction to discuss disease risk factors for cardiac and pulmonary disease and treatment options.  Reviews associated critical values for Overweight/Obesity, Hypertension, Cholesterol, and Diabetes.  Discusses basics of heart failure: signs/symptoms and treatments.  Introduces Heart Failure Zone chart for action plan  for heart failure.  Written material given at graduation. Flowsheet Row Pulmonary Rehab from 09/02/2022 in [facility]iac [facility] Pulmonary Rehab  Education need identified 07/30/22       Core Components/Risk Factors/Patient Goals Review:   Goals and Risk Factor Review     Row Name 08/17/22 1602             Core Components/Risk Factors/Patient Goals Review   Personal Goals Review Improve shortness of breath with ADL's       Review Spoke to patient about their shortness of breath and what they can do to improve. Patient has been informed of breathing techniques when starting the program. Patient is informed to tell staff if they have had any med changes and that certain meds they are taking or not taking can be causing shortness of breath.       Expected Outcomes Short: Attend [facility] regularly to improve shortness of breath with ADL's. Long: maintain independence with ADL's                Core Components/Risk Factors/Patient Goals at Discharge (Final Review):   Goals and Risk Factor Review - 08/17/22 1602       Core Components/Risk Factors/Patient Goals Review   Personal Goals Review Improve shortness of breath with ADL's    Review Spoke to patient about their shortness of breath and what they can do to improve. Patient has been informed of breathing techniques when starting the program. Patient is informed to tell staff if they have had any med changes and that certain meds they are taking or not taking can be causing shortness of breath.    Expected Outcomes Short: Attend [facility] regularly to improve shortness of breath with ADL's. Long: maintain independence with ADL's             ITP Comments:  ITP Comments     Row Name 07/28/22 1308 07/30/22 1108 08/03/22 1539 08/05/22 0831 09/02/22 1159   ITP Comments Initial orientation completed. Diagnosis can be found in [facility] 2/28. EP Orientation scheduled for Thursday 3/14 at 9:30am. Completed and gym orientation. Initial ITP  created and sent for review to Dr. Vida Rigger, Medical Director. First full day of exercise!  Patient was oriented to gym and equipment including functions, settings, policies, and procedures.  Patient's individual exercise prescription and treatment plan were reviewed.  All starting workloads were established based on the results of the 6 minute walk test done at initial orientation visit.  The plan for exercise progression was also introduced and progression will be customized based on patient's performance and goals. 30 Day review completed. Medical Director ITP review done, changes made as directed, and signed approval by Medical Director.     new to program 30 day review completed. ITP sent to Dr. Jinny Sanders, Medical Director of  Pulmonary Rehab. Continue with ITP unless changes are made by physician.    Row Name 09/21/22 1531 09/29/22 0835 09/30/22 0847 09/30/22 1154 10/06/22 1553   ITP Comments Maurine Minister stopped by to inform staff that they are wanting to do a heart cath next week because of his symptoms. He is following closely with his doctors to manage his blood pressure concerns and his chest pain. He will come back to class after his heart cath. Since Jaree has been out on medical hold,  we have not been able to assess for goals this round. 30 Day review completed. Medical Director ITP review done, changes made as directed, and signed approval by Medical Director.   out with medical concerns.  admitted to [facility] 5/14 Called patient to follow up on medical hold with pulmonary rehab. They are still adjusting some of his medications and he is due to see his cardiologist today, 5/15 for a follow up appointment with Dr. Brion Aliment. Patient will discuss with him on what they want to do and if he is appropriate to return to rehab. Patient is aware he needs to be feeling good and needs to receive clearance to return to rehab. Patient verbalized understanding. Remain patient on medical hold until further notice.  Panav continues his medical hold as he has been getting evaluated for symptoms with testing including a heart cath. RN spoke with patient yesterday who advised patient to speak with his heart failure doctor at his f/u appt on 5/29 to seek clearance to return to rehab. Per patient, he has still been experiencing symptoms and therefore we need his MD to state whether or not he is cleared to exercise.    Row Name 10/22/22 1426 10/28/22 1100         ITP Comments Called to check on patient.  He has been out with chest pain and heart failure work up.  He has not attended since 09/07/22.  Unable to assess for goal again 30 Day review completed. Medical Director ITP review done, changes made as directed, and signed approval by Medical Director.   out medical reason               Comments:

## 2022-10-29 ENCOUNTER — Other Ambulatory Visit (HOSPITAL_COMMUNITY): Payer: Self-pay

## 2022-10-29 ENCOUNTER — Other Ambulatory Visit: Payer: Self-pay

## 2022-10-29 MED ORDER — ATORVASTATIN CALCIUM 80 MG PO TABS
80.0000 mg | ORAL_TABLET | Freq: Every evening | ORAL | 3 refills | Status: DC
  Filled 2022-10-29: qty 90, 90d supply, fill #0
  Filled 2023-01-24: qty 90, 90d supply, fill #1
  Filled 2023-03-01 – 2023-06-05 (×2): qty 90, 90d supply, fill #2

## 2022-11-06 ENCOUNTER — Emergency Department: Payer: Medicaid Other

## 2022-11-06 ENCOUNTER — Other Ambulatory Visit: Payer: Self-pay

## 2022-11-06 ENCOUNTER — Emergency Department
Admission: EM | Admit: 2022-11-06 | Discharge: 2022-11-06 | Disposition: A | Discharge status: Home / Self Care | Payer: Medicaid Other | Attending: Student in an Organized Health Care Education/Training Program | Admitting: Student in an Organized Health Care Education/Training Program

## 2022-11-06 DIAGNOSIS — R0789 Other chest pain: Secondary | ICD-10-CM

## 2022-11-06 DIAGNOSIS — I5042 Chronic combined systolic (congestive) and diastolic (congestive) heart failure: Secondary | ICD-10-CM | POA: Diagnosis not present

## 2022-11-06 DIAGNOSIS — R7989 Other specified abnormal findings of blood chemistry: Secondary | ICD-10-CM | POA: Diagnosis not present

## 2022-11-06 DIAGNOSIS — I517 Cardiomegaly: Secondary | ICD-10-CM | POA: Diagnosis not present

## 2022-11-06 DIAGNOSIS — R0609 Other forms of dyspnea: Secondary | ICD-10-CM | POA: Diagnosis not present

## 2022-11-06 DIAGNOSIS — R079 Chest pain, unspecified: Secondary | ICD-10-CM | POA: Diagnosis not present

## 2022-11-06 LAB — TROPONIN I (HIGH SENSITIVITY)
Troponin I (High Sensitivity): 25 ng/L — ABNORMAL HIGH (ref <18)
Troponin I (High Sensitivity): 23 ng/L — ABNORMAL HIGH (ref <18)

## 2022-11-06 LAB — BASIC METABOLIC PANEL
Anion gap: 8 (ref 5–15)
BUN: 28 mg/dL — ABNORMAL HIGH (ref 6–20)
CO2: 22 mmol/L (ref 22–32)
Calcium: 9.3 mg/dL (ref 8.9–10.3)
Chloride: 105 mmol/L (ref 98–111)
Creatinine, Ser: 1.98 mg/dL — ABNORMAL HIGH (ref 0.61–1.24)
GFR, Estimated: 40 mL/min — ABNORMAL LOW (ref >60)
Glucose, Bld: 131 mg/dL — ABNORMAL HIGH (ref 70–99)
Potassium: 4.6 mmol/L (ref 3.5–5.1)
Sodium: 135 mmol/L (ref 135–145)

## 2022-11-06 LAB — CBC
HCT: 49 % (ref 39.0–52.0)
Hemoglobin: 16.2 g/dL (ref 13.0–17.0)
MCH: 27.8 pg (ref 26.0–34.0)
MCHC: 33.1 g/dL (ref 30.0–36.0)
MCV: 84 fL (ref 80.0–100.0)
Platelets: 228 10*3/uL (ref 150–400)
RBC: 5.83 MIL/uL — ABNORMAL HIGH (ref 4.22–5.81)
RDW: 13.6 % (ref 11.5–15.5)
WBC: 7.8 10*3/uL (ref 4.0–10.5)
nRBC: 0 % (ref 0.0–0.2)

## 2022-11-06 LAB — D-DIMER, QUANTITATIVE: D-Dimer, Quant: 1.09 ug/mL-FEU — ABNORMAL HIGH (ref 0.00–0.50)

## 2022-11-06 MED ORDER — IOHEXOL 350 MG/ML SOLN
75.0000 mL | Freq: Once | INTRAVENOUS | Status: AC | PRN
  Administered 2022-11-06: 75 mL via INTRAVENOUS

## 2022-11-06 NOTE — ED Provider Notes (Signed)
[facility] Provider Note    Event Date/Time   First MD Initiated Contact with Patient 11/06/22 [phone]     (approximate)   History   Chest Pain   HPI  Clarence [name] is a 52 y.o. male with a history of chronic combined systolic and diastolic CHF and ischemic cardiomyopathy followed with [facility] presents to the ER for evaluation for several days of intermittent brief chest pain.  Sometimes worse with exertion but is fairly brief in nature no associated diaphoresis.  Does feel some shortness of breath.  Does not feel like he is retaining fluid.  Has been compliant with his medications.     Physical Exam   Triage Vital Signs: ED Triage Vitals  Enc Vitals Group     BP 11/06/22 1413 124/85     Pulse Rate 11/06/22 1413 69     Resp 11/06/22 1413 16     Temp 11/06/22 1413 98.2 F (36.8 C)     Temp src --      SpO2 11/06/22 1413 97 %     Weight --      Height --      Head Circumference --      Peak Flow --      Pain Score 11/06/22 1410 0     Pain Loc --      Pain Edu? --      Excl. in GC? --     Most recent vital signs: Vitals:   11/06/22 1413 11/06/22 1842  BP: 124/85 137/72  Pulse: 69 64  Resp: 16 18  Temp: 98.2 F (36.8 C) 97.9 F (36.6 C)  SpO2: 97% 97%     Constitutional: Alert  Eyes: Conjunctivae are normal.  Head: Atraumatic. Nose: No congestion/rhinnorhea. Mouth/Throat: Mucous membranes are moist.   Neck: Painless ROM.  Cardiovascular:   Good peripheral circulation. Respiratory: Normal respiratory effort.  No retractions.  Gastrointestinal: Soft and nontender.  Musculoskeletal:  no deformity Neurologic:  MAE spontaneously. No gross focal neurologic deficits are appreciated.  Skin:  Skin is warm, dry and intact. No rash noted. Psychiatric: Mood and affect are normal. Speech and behavior are normal.    ED Results / Procedures / Treatments   Labs (all labs ordered are listed, but only abnormal results are displayed) Labs  Reviewed  BASIC METABOLIC PANEL - Abnormal; Notable for the following components:      Result Value   Glucose, Bld 131 (*)    BUN 28 (*)    Creatinine, Ser 1.98 (*)    GFR, Estimated 40 (*)    All other components within normal limits  CBC - Abnormal; Notable for the following components:   RBC 5.83 (*)    All other components within normal limits  D-DIMER, QUANTITATIVE - Abnormal; Notable for the following components:   D-Dimer, Quant 1.09 (*)    All other components within normal limits  TROPONIN I (HIGH SENSITIVITY) - Abnormal; Notable for the following components:   Troponin I (High Sensitivity) 25 (*)    All other components within normal limits  TROPONIN I (HIGH SENSITIVITY) - Abnormal; Notable for the following components:   Troponin I (High Sensitivity) 23 (*)    All other components within normal limits     EKG  ED ECG REPORT I, Willy Eddy, the attending physician, personally viewed and interpreted this ECG.   Date: 11/06/2022  EKG Time: 14:11  Rate: 60  Rhythm: sinus  Axis: normal  Intervals: normal  ST&T Change:  no stemi, non specific t wave abn unchanged from prior    RADIOLOGY Please see ED Course for my review and interpretation.  I personally reviewed all radiographic images ordered to evaluate for the above acute complaints and reviewed radiology reports and findings.  These findings were personally discussed with the patient.  Please see medical record for radiology report.    PROCEDURES:  Critical Care performed: No  Procedures   MEDICATIONS ORDERED IN ED: Medications  iohexol (OMNIPAQUE) 350 MG/ML injection 75 mL (75 mLs Intravenous Contrast Given 11/06/22 1749)     IMPRESSION / MDM / ASSESSMENT AND PLAN / ED COURSE  I reviewed the triage vital signs and the nursing notes.                              Differential diagnosis includes, but is not limited to, ACS, pericarditis, esophagitis, boerhaaves, pe, dissection, pna,  bronchitis, costochondritis  Patient presenting to the ER for evaluation of symptoms as described above.  Based on symptoms, risk factors and considered above differential, this presenting complaint could reflect a potentially life-threatening illness therefore the patient will be placed on continuous pulse oximetry and telemetry for monitoring.  Laboratory evaluation will be sent to evaluate for the above complaints.     Clinical Course as of 11/06/22 1920  Clarence [name] Nov 06, 2022  1919 Patient reassessed.  Troponins stable.  No sign of PE.  He remains pain-free.  He has close outpatient follow-up with cardiology this coming week.  We discussed option for observation in the hospital versus following up as an outpatient with agreeing to return if his symptoms return.  Patient is agreeable with this plan. [PR]    Clinical Course User Index [PR] Willy Eddy, MD     FINAL CLINICAL IMPRESSION(S) / ED DIAGNOSES   Final diagnoses:  Atypical chest pain     Rx / DC Orders   ED Discharge Orders     None        Note:  This document was prepared using Dragon voice recognition software and may include unintentional dictation errors.    Willy Eddy, MD 11/06/22 Jerene Bears

## 2022-11-06 NOTE — ED Triage Notes (Addendum)
Pt c/o severe intermittent CP x1 month. Pt reports chronic DOE, denies N/V/D. PT AOX4, NAD noted. Pt was sent by PCP for cardiac workup.

## 2022-11-06 NOTE — ED Notes (Signed)
Pt verbalizes understanding of discharge instructions. Opportunity for questioning and answers were provided. Pt discharged from ED to home.   ? ?

## 2022-11-10 ENCOUNTER — Other Ambulatory Visit: Payer: Self-pay

## 2022-11-10 ENCOUNTER — Other Ambulatory Visit (HOSPITAL_COMMUNITY): Payer: Self-pay

## 2022-11-10 ENCOUNTER — Emergency Department
Admission: EM | Admit: 2022-11-10 | Discharge: 2022-11-10 | Disposition: A | Discharge status: Home / Self Care | Payer: Medicaid Other | Attending: Emergency Medicine | Admitting: Emergency Medicine

## 2022-11-10 DIAGNOSIS — I2510 Atherosclerotic heart disease of native coronary artery without angina pectoris: Secondary | ICD-10-CM | POA: Insufficient documentation

## 2022-11-10 DIAGNOSIS — I952 Hypotension due to drugs: Secondary | ICD-10-CM | POA: Diagnosis not present

## 2022-11-10 DIAGNOSIS — I959 Hypotension, unspecified: Secondary | ICD-10-CM | POA: Diagnosis not present

## 2022-11-10 DIAGNOSIS — I509 Heart failure, unspecified: Secondary | ICD-10-CM | POA: Diagnosis not present

## 2022-11-10 DIAGNOSIS — T465X5A Adverse effect of other antihypertensive drugs, initial encounter: Secondary | ICD-10-CM | POA: Insufficient documentation

## 2022-11-10 DIAGNOSIS — R42 Dizziness and giddiness: Secondary | ICD-10-CM | POA: Diagnosis not present

## 2022-11-10 LAB — BASIC METABOLIC PANEL
Anion gap: 8 (ref 5–15)
BUN: 22 mg/dL — ABNORMAL HIGH (ref 6–20)
CO2: 20 mmol/L — ABNORMAL LOW (ref 22–32)
Calcium: 9.1 mg/dL (ref 8.9–10.3)
Chloride: 107 mmol/L (ref 98–111)
Creatinine, Ser: 1.83 mg/dL — ABNORMAL HIGH (ref 0.61–1.24)
GFR, Estimated: 44 mL/min — ABNORMAL LOW (ref >60)
Glucose, Bld: 129 mg/dL — ABNORMAL HIGH (ref 70–99)
Potassium: 3.9 mmol/L (ref 3.5–5.1)
Sodium: 135 mmol/L (ref 135–145)

## 2022-11-10 LAB — CBC
HCT: 42.1 % (ref 39.0–52.0)
Hemoglobin: 14.2 g/dL (ref 13.0–17.0)
MCH: 28.1 pg (ref 26.0–34.0)
MCHC: 33.7 g/dL (ref 30.0–36.0)
MCV: 83.2 fL (ref 80.0–100.0)
Platelets: 199 10*3/uL (ref 150–400)
RBC: 5.06 MIL/uL (ref 4.22–5.81)
RDW: 13.4 % (ref 11.5–15.5)
WBC: 6.8 10*3/uL (ref 4.0–10.5)
nRBC: 0 % (ref 0.0–0.2)

## 2022-11-10 LAB — URINALYSIS, ROUTINE W REFLEX MICROSCOPIC
Bacteria, UA: NONE SEEN
Bilirubin Urine: NEGATIVE
Glucose, UA: >=500 mg/dL — AB
Hgb urine dipstick: NEGATIVE
Ketones, ur: NEGATIVE mg/dL
Nitrite: NEGATIVE
Protein, ur: NEGATIVE mg/dL
Specific Gravity, Urine: 1.016 (ref 1.005–1.030)
Squamous Epithelial / HPF: NONE SEEN /HPF (ref 0–5)
pH: 5 (ref 5.0–8.0)

## 2022-11-10 NOTE — Discharge Instructions (Signed)
Take your normal medications to as prescribed.  Follow-up with Dr. Shirlee Latch tomorrow as planned.  Return to the ER for any new, worsening, or persistent severe dizziness or lightheadedness, recurrent low blood pressure readings, chest pain, difficulty breathing, or any other new or worsening symptoms that concern you.

## 2022-11-10 NOTE — ED Triage Notes (Signed)
Pt presents to ED with c/o of dizziness and nausea. Pt also endorses issues with BP even though he takes BP meds. NAD noted. Pt is A&Ox4.

## 2022-11-10 NOTE — ED Provider Notes (Signed)
[facility] Provider Note    Event Date/Time   First MD Initiated Contact with Patient 11/10/22 1655     (approximate)   History   Dizziness   HPI  Clarence [name] is a 52 y.o. male with a history of CHF, CAD, and ischemic cardiomyopathy who presents with low blood pressure readings and dizziness today which she describes as feeling somewhat lightheaded.  The patient states he has some chest discomfort but reports that this is chronic and unchanged today.  He occasionally has headaches associated with the dizziness.  He states that he had several blood pressure readings at home today over the last few hours that were as low as the 80s systolic.  He states he took all of his morning blood pressure medications around noon.  He reports that he has had some similar low readings recently.  He denies any shortness of breath, vertigo or spinning sensation, fever or chills, or any vomiting or diarrhea.  I reviewed the past medical records.  The patient was most recently admitted inpatient last month for observation after cardiac catheterization.  He was seen in the ED on 6/21 due to chest pain but had a negative workup at that time.   Physical Exam   Triage Vital Signs: ED Triage Vitals  Enc Vitals Group     BP 11/10/22 1548 96/70     Pulse Rate 11/10/22 1548 69     Resp 11/10/22 1548 18     Temp 11/10/22 1548 98.7 F (37.1 C)     Temp Source 11/10/22 1548 Oral     SpO2 11/10/22 1548 93 %     Weight --      Height --      Head Circumference --      Peak Flow --      Pain Score 11/10/22 1549 0     Pain Loc --      Pain Edu? --      Excl. in GC? --     Most recent vital signs: Vitals:   11/10/22 1800 11/10/22 1832  BP: 110/78 112/71  Pulse: 70   Resp: 18 16  Temp: 98.5 F (36.9 C)   SpO2: 95%      General: Alert, well-appearing, no distress.  CV:  Good peripheral perfusion.  Normal heart notes. Resp:  Normal effort.  Lungs CTAB. Abd:  No  distention.  Other:  Trace bilateral lower extremity edema.   ED Results / Procedures / Treatments   Labs (all labs ordered are listed, but only abnormal results are displayed) Labs Reviewed  BASIC METABOLIC PANEL - Abnormal; Notable for the following components:      Result Value   CO2 20 (*)    Glucose, Bld 129 (*)    BUN 22 (*)    Creatinine, Ser 1.83 (*)    GFR, Estimated 44 (*)    All other components within normal limits  URINALYSIS, ROUTINE W REFLEX MICROSCOPIC - Abnormal; Notable for the following components:   Color, Urine YELLOW (*)    APPearance CLEAR (*)    Glucose, UA >=500 (*)    Leukocytes,Ua TRACE (*)    All other components within normal limits  CBC     EKG  ED ECG REPORT I, Dionne Bucy, the attending physician, personally viewed and interpreted this ECG.  Date: 11/10/2022 EKG Time: 1547 Rate: 54 Rhythm: normal sinus rhythm QRS Axis: normal Intervals: normal ST/T Wave abnormalities: Onset Civic T wave abnormalities  Narrative Interpretation: no evidence of acute ischemia; no significant change when compared to EKG of 11/06/2022    RADIOLOGY    PROCEDURES:  Critical Care performed: No  Procedures   MEDICATIONS ORDERED IN ED: Medications - No data to display   IMPRESSION / MDM / ASSESSMENT AND PLAN / ED COURSE  I reviewed the triage vital signs and the nursing notes.  52 year old male with PMH as noted above presents with low blood pressure readings after taking his blood pressure medication earlier today along with some lightheadedness.  The symptoms have now improved.  He did have one low blood pressure reading on arrival to the ED with an SBP in the 90s.  Reviewing his records he is on carvedilol, Imdur, Entresto, and spironolactone.  Differential diagnosis includes, but is not limited to, hypotension related to his blood pressure medications, dehydration/hypovolemia, less likely infection.  EKG is nonischemic and there is no  evidence of cardiac cause.    Labs are unremarkable with slightly elevated creatinine which is baseline for the patient.  Urinalysis shows no acute findings.  There is no anemia or leukocytosis.  The patient is feeling better now.  Repeat blood pressures are normal.  At this time, the patient is stable for discharge home.  He has follow-up with his cardiologist Dr. Shirlee Latch tomorrow.  I advised him to keep a log of his blood pressure readings and discuss whether any dosage or medication may need to be changed since his blood pressures have been running low.  Patient's presentation is most consistent with acute complicated illness / injury requiring diagnostic workup.  I gave the patient strict return precautions and he expressed understanding.   FINAL CLINICAL IMPRESSION(S) / ED DIAGNOSES   Final diagnoses:  Dizziness  Hypotension due to drugs     Rx / DC Orders   ED Discharge Orders     None        Note:  This document was prepared using Dragon voice recognition software and may include unintentional dictation errors.    Dionne Bucy, MD 11/10/22 1943

## 2022-11-11 ENCOUNTER — Encounter: Payer: Self-pay | Admitting: *Deleted

## 2022-11-11 ENCOUNTER — Encounter: Payer: Self-pay | Admitting: Cardiology

## 2022-11-11 ENCOUNTER — Telehealth: Payer: Self-pay | Admitting: *Deleted

## 2022-11-11 ENCOUNTER — Other Ambulatory Visit (HOSPITAL_COMMUNITY): Payer: Self-pay

## 2022-11-11 ENCOUNTER — Encounter: Payer: Medicaid Other | Admitting: *Deleted

## 2022-11-11 ENCOUNTER — Ambulatory Visit: Payer: Medicaid Other | Attending: Cardiology | Admitting: Cardiology

## 2022-11-11 ENCOUNTER — Encounter: Payer: Medicaid Other | Admitting: Cardiology

## 2022-11-11 VITALS — BP 103/67 | HR 55 | Wt 227.8 lb

## 2022-11-11 DIAGNOSIS — I2510 Atherosclerotic heart disease of native coronary artery without angina pectoris: Secondary | ICD-10-CM | POA: Diagnosis not present

## 2022-11-11 DIAGNOSIS — E781 Pure hyperglyceridemia: Secondary | ICD-10-CM | POA: Diagnosis not present

## 2022-11-11 DIAGNOSIS — N183 Chronic kidney disease, stage 3 unspecified: Secondary | ICD-10-CM | POA: Diagnosis not present

## 2022-11-11 DIAGNOSIS — Z87891 Personal history of nicotine dependence: Secondary | ICD-10-CM | POA: Insufficient documentation

## 2022-11-11 DIAGNOSIS — Z79899 Other long term (current) drug therapy: Secondary | ICD-10-CM | POA: Diagnosis not present

## 2022-11-11 DIAGNOSIS — I130 Hypertensive heart and chronic kidney disease with heart failure and stage 1 through stage 4 chronic kidney disease, or unspecified chronic kidney disease: Secondary | ICD-10-CM | POA: Diagnosis not present

## 2022-11-11 DIAGNOSIS — I429 Cardiomyopathy, unspecified: Secondary | ICD-10-CM | POA: Diagnosis not present

## 2022-11-11 DIAGNOSIS — Z7902 Long term (current) use of antithrombotics/antiplatelets: Secondary | ICD-10-CM | POA: Diagnosis not present

## 2022-11-11 DIAGNOSIS — I5022 Chronic systolic (congestive) heart failure: Secondary | ICD-10-CM | POA: Diagnosis not present

## 2022-11-11 DIAGNOSIS — I5042 Chronic combined systolic (congestive) and diastolic (congestive) heart failure: Secondary | ICD-10-CM

## 2022-11-11 MED ORDER — CLOPIDOGREL BISULFATE 75 MG PO TABS: 75.0000 mg | ORAL_TABLET | Freq: Every day | ORAL | 3 refills | Status: DC

## 2022-11-11 MED ORDER — CARVEDILOL 12.5 MG PO TABS: 12.5000 mg | ORAL_TABLET | Freq: Two times a day (BID) | ORAL | 6 refills | Status: DC

## 2022-11-11 MED ORDER — EZETIMIBE 10 MG PO TABS: 10.0000 mg | ORAL_TABLET | Freq: Every day | ORAL | 3 refills | Status: DC

## 2022-11-11 NOTE — Progress Notes (Signed)
Daily Session Note  Patient Details  Name: Clarence [name] MRN: [id number] Date of Birth: 23-Dec-1970 Referring Provider:   Flowsheet Row Pulmonary Rehab from 07/30/2022 in [facility] Cardiac and Pulmonary Rehab  Referring Provider Dr. Yvonne Kendall, MD       Encounter Date: 11/11/2022  Check In:  Session Check In - 11/11/22 1546       Check-In   Supervising physician immediately available to respond to emergencies See telemetry face sheet for immediately available ER MD    Location [facility]-Cardiac & Pulmonary Rehab    Staff Present Susann Givens, RN BSN;Laureen Manson Passey, BS, RRT, CPFT;Kelly Madilyn Fireman, BS, ACSM CEP, Exercise Physiologist    Virtual Visit No    Medication changes reported     No    Fall or balance concerns reported    No    Warm-up and Cool-down Performed on first and last piece of equipment    Resistance Training Performed Yes    VAD Patient? No    PAD/SET Patient? No      Pain Assessment   Currently in Pain? No/denies                Social History   Tobacco Use  Smoking Status Former   Types: Cigars   Quit date: 08/13/2021   Years since quitting: 1.2  Smokeless Tobacco Not on file    Goals Met:  Independence with exercise equipment Exercise tolerated well No report of concerns or symptoms today Strength training completed today  Goals Unmet:  Not Applicable  Comments: Pt able to follow exercise prescription today without complaint.  Will continue to monitor for progression.    Dr. Bethann Punches is Medical Director for [facility] Cardiac Rehabilitation.  Dr. Vida Rigger is Medical Director for [facility] Pulmonary Rehabilitation.

## 2022-11-11 NOTE — Patient Instructions (Signed)
Medication Changes:  Please Avoid Lasix use.   Decrease Coreg to 12.5 mg take 2 times a day.    Referrals:  You have been referred to Cardiac Rehab. You will receive a call to schedule your appointment.    Special Instructions // Education:  Do the following things EVERYDAY: Weigh yourself in the morning before breakfast. Write it down and keep it in a log. Take your medicines as prescribed Eat low salt foods--Limit salt (sodium) to 2000 mg per day.  Stay as active as you can everyday Limit all fluids for the day to less than 2 liters   Follow-Up in: please follow up in 6 Weeks with Dr. Shirlee Latch.     If you have any questions or concerns before your next appointment please send [place] a message through [place] or call our office at [phone] Monday-Friday 8 am-5 pm.   If you have an urgent need after hours on the weekend please call your Primary Cardiologist or the [facility] in [place] at [phone].

## 2022-11-11 NOTE — Progress Notes (Signed)
PCP: Caesar Bookman, NP Cardiology: Dr. Okey Dupre HF Cardiology: Dr Shirlee Latch  52 y.o. with history of CAD, chronic systolic CHF, Crohns disease, CKD stage 3, and HTN was referred by Dr. Okey Dupre to HF clinic for evaluation of CHF.  Patient was admitted in 3/23 with newly found cardiomyopathy, CHF and atypical PNA.  Echo in 3/23 showed EF 30-35%, moderate LVH, normal RV. LHC showed occluded RCA with collaterals, 60% mLAD, 65% mLCx, 90% mid OM1.  RHC showed elevated filling pressures and low CI (2).  Patient was admitted again in 4/23 with CHF. He was admitted in 5/23 with hypertensive urgency and chest pain, CTA abdomen/pelvis did not show renal artery stenosis. He was admitted again in 6/23 with hypertensive urgency.  He quit smoking after 3/23 admission.  Rarely drinks ETOH and no drugs.  No strong family history of CAD or cardiomyopathy.    Sleep study was done, not suggestive of OSA.   He was admitted in 4/24 with chest pain => no evidence for ACS.  He had severe watery diarrhea via his ostomy and was dehydrated. Meds were held. Echo in 4/24 was improved, showing EF 45-50%, inferior and inferolateral hypokinesis, normal RV.   Patient continued to have chest pain episodes so LHC/RHC was done in 5/24 showing occluded mRCA with collaterals, 90% stenosis with severe diffuse disease in OM1. 50% mLAD, 60% proximal and mid LCx (unchanged from prior, no interventional target); normal filling pressures and preserved cardiac output.   Patient went to the ER 11/06/22 with atypical chest pain, negative workup.  He was in the ER again 11/10/22 with lightheadedness.  BP stable in ER but he said that SBP was as low as 80s at home.   Patient returns for followup of CHF and CAD.  I think that he has been confused with his medications, and I am not certain that he is taking them correctly.  Still has episodes of lightheadedness on and off.  Today, BP 103/67.  No syncope.  Chest pain has been much less since starting ranolazine.   The chest pain that he does have is atypical.  He is fatigued with long walks and has trouble walking in the heat.  He has not been taking Lasix.  Creatinine mildly higher recently at 1.83.   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 Crohns disease: s/p colectomy with ostomy.  2. CKD stage 3: History of hyperkalemia.  3. HTN: CT abdomen/pelvis with no evidence for renal artery stenosis.  4. Pulmonary nodules: Stable on last CT.  5. CAD: LHC 3/23 with CTO mid RCA with collaterals, 60% mLAD, 65% mLCx, 90% mid OM1.  - LHC (5/24): occluded mRCA with collaterals, 90% stenosis with severe diffuse disease in OM1. 50% mLAD, 60% proximal and mid LCx (unchanged from prior, no interventional target) 6. Chronic systolic CHF: Ischemic cardiomyopathy (cannot fully rule out nonischemic component).  - Echo (3/23): EF 30-35%, moderate LVH, normal RV. - RHC (3/23): mean RA 16, PA 67/30 mean 42, mean PCWP 25, CI 2, PVR 4 - Echo (5/23): EF 35-40%, global HK, moderate LVH, RV normal, mild MR - Echo (4/24): EF 45-50%, inferior  and inferolateral hypokinesis, normal RV.  - RHC (5/24): mean RA 4, PA 36/9, mean PCWP 10, CI 2.36, PVR 1.7 WU 7. Sleep study negative for OSA.   SH: Single, quit smoking in 3/23, rare ETOH/no drugs, lives in [place] and works for a [employer].   FH: Uncles with "heart trouble"  ROS: All systems reviewed and negative except as per HPI.   Current Outpatient Medications  Medication Sig Dispense Refill   acetaminophen (TYLENOL) 325 MG tablet Take 650 mg by mouth at bedtime.     aspirin EC 81 MG tablet Take 1 tablet (81 mg total) by mouth daily.  Swallow whole. 30 tablet 4   atorvastatin (LIPITOR) 80 MG tablet Take 1 tablet (80 mg total) by mouth every evening. 90 tablet 3   carvedilol (COREG) 12.5 MG tablet Take 1 tablet (12.5 mg total) by mouth 2 (two) times daily. 60 tablet 6   empagliflozin (JARDIANCE) 10 MG TABS tablet Take 10 mg by mouth daily.     famotidine (PEPCID) 10 MG tablet Take 10 mg by mouth at bedtime. As needed     fenofibrate (TRICOR) 48 MG tablet Take 1 tablet (48 mg total) by mouth daily. 30 tablet 8   furosemide (LASIX) 20 MG tablet Take 1 tablet (20 mg total) by mouth daily as needed. For weight gain of 3 lb in 24 hours or 5 lbs in one week 90 tablet 1   isosorbide mononitrate (IMDUR) 30 MG 24 hr tablet Take 1 tablet (30 mg total) by mouth daily. 90 tablet 1   nitroGLYCERIN (NITROSTAT) 0.4 MG SL tablet Place 1 tablet (0.4 mg total) under the tongue every 5 (five) minutes as needed for chest pain. 25 tablet 3   ondansetron (ZOFRAN) 4 MG tablet Take 1 tablet (4 mg total) by mouth every 8 (eight) hours as needed for nausea or vomiting. 20 tablet 0   pantoprazole (PROTONIX) 40 MG tablet Take 1 tablet (40 mg total) by mouth daily. 30 tablet 11   ranolazine (RANEXA) 500 MG 12 hr tablet Take 500 mg by mouth 2 (two) times daily.     sacubitril-valsartan (ENTRESTO) 24-26 MG Take 1 tablet by mouth 2 (two) times daily. 180 tablet 1   spironolactone (ALDACTONE) 25 MG tablet Take 0.5 tablets (12.5 mg total) by mouth daily. 45 tablet 1   clopidogrel (PLAVIX) 75 MG tablet Take 1 tablet (75 mg total) by mouth daily. 30 tablet 3   ezetimibe (ZETIA) 10 MG tablet Take 1 tablet (10 mg total) by mouth daily. 90 tablet 3   Current Facility-Administered Medications  Medication Dose Route Frequency Provider Last Rate Last Admin   sodium chloride flush (NS) 0.9 % injection 3 mL  3 mL Intravenous Q12H Laurey Morale, MD       Wt Readings from Last 3 Encounters:  11/11/22 227 lb 12.8 oz (103.3 kg)  10/14/22 231 lb (104.8 kg)  10/02/22  226 lb 6.4 oz (102.7 kg)   BP 103/67 (BP Location: Right Arm, Patient Position: Sitting)   Pulse (!) 55   Wt 227 lb 12.8 oz (103.3 kg)   SpO2 97%   BMI 32.69 kg/m  General: NAD Neck: No JVD, no thyromegaly or thyroid nodule.  Lungs: Clear to auscultation bilaterally with normal respiratory effort. CV: Nondisplaced PMI.  Heart regular S1/S2, no S3/S4, no murmur.  No peripheral edema.  No carotid bruit.  Normal pedal pulses.  Abdomen: Soft, nontender, no hepatosplenomegaly, no distention.  Skin: Intact without lesions or  rashes.  Neurologic: Alert and oriented x 3.  Psych: Normal affect. Extremities: No clubbing or cyanosis.  HEENT: Normal.   Assessment/Plan: 1. Chronic systolic CHF: Suspect primarily ischemic cardiomyopathy but cannot rule out component of nonischemic cardiomyopathy. RHC in 3/23 with elevated filling pressures and low CI, 2. Echo in 5/23 with EF 35-40%, normal RV.  Echo in 4/24 with EF up to 45-50%, inferior/inferolateral hypokinesis. Still with NYHA class II-III symptoms. Not volume overloaded on exam.    - Continue Entresto 24/26 bid, no room to increase with orthostatic symptoms.  - I asked him to avoid using Lasix, not volume overloaded and having orthostatic symptoms at times.   - He appears to be taking Coreg 18.75 mg bid (I thought he was taking 9.375 mg bid).  I will have him decrease Coreg to 12.5 mg bid with low BP at times.  - Continue Jardiance 10 mg daily.  - With orthostatic symptoms, take spironolactone 12.5 at bedtime.  - EF is out of ICD range.  2. CAD: Cath in 3/23 with CTO mid RCA with collaterals, 60% mLAD, 65% mLCx, 90% mid OM1.  Cath in 5/24 was very similar with occluded mRCA with collaterals, 90% stenosis with severe diffuse disease in OM1. 50% mLAD, 60% proximal and mid LCx.  On cath, he has no interventional target though suspect he could get some angina from the diffusely disease OM1 (occluded RCA has good collaterals).  Chest pain improved on  ranolazine, now only has occasional atypical chest pain.  - Continue Imdur 30 mg daily.  - Lowering Coreg as above.  - Continue ranolazone.  - Continue ASA 81 and Plavix 75 daily.  - Continue atorvastatin and Zetia, good lipids in 5/24.  - Continue fenofibrate with elevated triglycerides.  3. CKD: Stage 3.  Follow closely, most recent creatinine 1.83.   Followup in 6 wks.   Marca Ancona 11/11/2022

## 2022-11-11 NOTE — Telephone Encounter (Signed)
Mr. Clarence [name] came in to rehab today to notify staff that he was seen by Dr. Shirlee Latch today in the heart failure clinic and was cleared to restart rehab. Per ov note 11/11/22 pt may return to cardiac rehab. Pt is scheduled to restart this afternoon at 3:30 pm.

## 2022-11-12 ENCOUNTER — Encounter: Payer: Medicaid Other | Admitting: *Deleted

## 2022-11-12 DIAGNOSIS — I5042 Chronic combined systolic (congestive) and diastolic (congestive) heart failure: Secondary | ICD-10-CM

## 2022-11-12 NOTE — Progress Notes (Signed)
Daily Session Note  Patient Details  Name: BRENDT DIBLE MRN: [id number] Date of Birth: 1971/03/22 Referring Provider:   Flowsheet Row Pulmonary Rehab from 07/30/2022 in [facility] Cardiac and Pulmonary Rehab  Referring Provider Dr. Yvonne Kendall, MD       Encounter Date: 11/12/2022  Check In:  Session Check In - 11/12/22 1535       Check-In   Supervising physician immediately available to respond to emergencies See telemetry face sheet for immediately available ER MD    Location [facility]-Cardiac & Pulmonary Rehab    Staff Present Susann Givens, RN BSN;Joseph [place], RCP,RRT,BSRT;Noah [place], [place], Exercise Physiologist    Virtual Visit No    Medication changes reported     No    Fall or balance concerns reported    No    Warm-up and Cool-down Performed on first and last piece of equipment    Resistance Training Performed Yes    VAD Patient? No    PAD/SET Patient? No      Pain Assessment   Currently in Pain? No/denies                Social History   Tobacco Use  Smoking Status Former   Types: Cigars   Quit date: 08/13/2021   Years since quitting: 1.2  Smokeless Tobacco Not on file    Goals Met:  Independence with exercise equipment Exercise tolerated well No report of concerns or symptoms today Strength training completed today  Goals Unmet:  Not Applicable  Comments: Pt able to follow exercise prescription today without complaint.  Will continue to monitor for progression.    Dr. Bethann Punches is Medical Director for [facility] Cardiac Rehabilitation.  Dr. Vida Rigger is Medical Director for [facility] Pulmonary Rehabilitation.

## 2022-11-13 ENCOUNTER — Telehealth: Payer: Self-pay

## 2022-11-13 NOTE — Transitions of Care (Post Inpatient/ED Visit) (Signed)
   11/13/2022  Name: Clarence [name] MRN: [id number] DOB: 11/27/1970  Today's TOC FU Call Status:    Attempted to reach the patient regarding the most recent Inpatient/ED visit.  Follow Up Plan: Additional outreach attempts will be made to reach the patient to complete the Transitions of Care (Post Inpatient/ED visit) call.   Signature : Guss Bunde [facility]

## 2022-11-16 ENCOUNTER — Encounter: Payer: Medicaid Other | Attending: Family | Admitting: *Deleted

## 2022-11-16 DIAGNOSIS — I5042 Chronic combined systolic (congestive) and diastolic (congestive) heart failure: Secondary | ICD-10-CM | POA: Insufficient documentation

## 2022-11-16 IMAGING — CR DG CHEST 2V
2 series · 2 of 2 positions shown · non-contrast
Comparison: October 06, 2021.

CLINICAL DATA: Chest pain and shortness of breath.

EXAM:
CHEST - 2 VIEW

[chest pa]
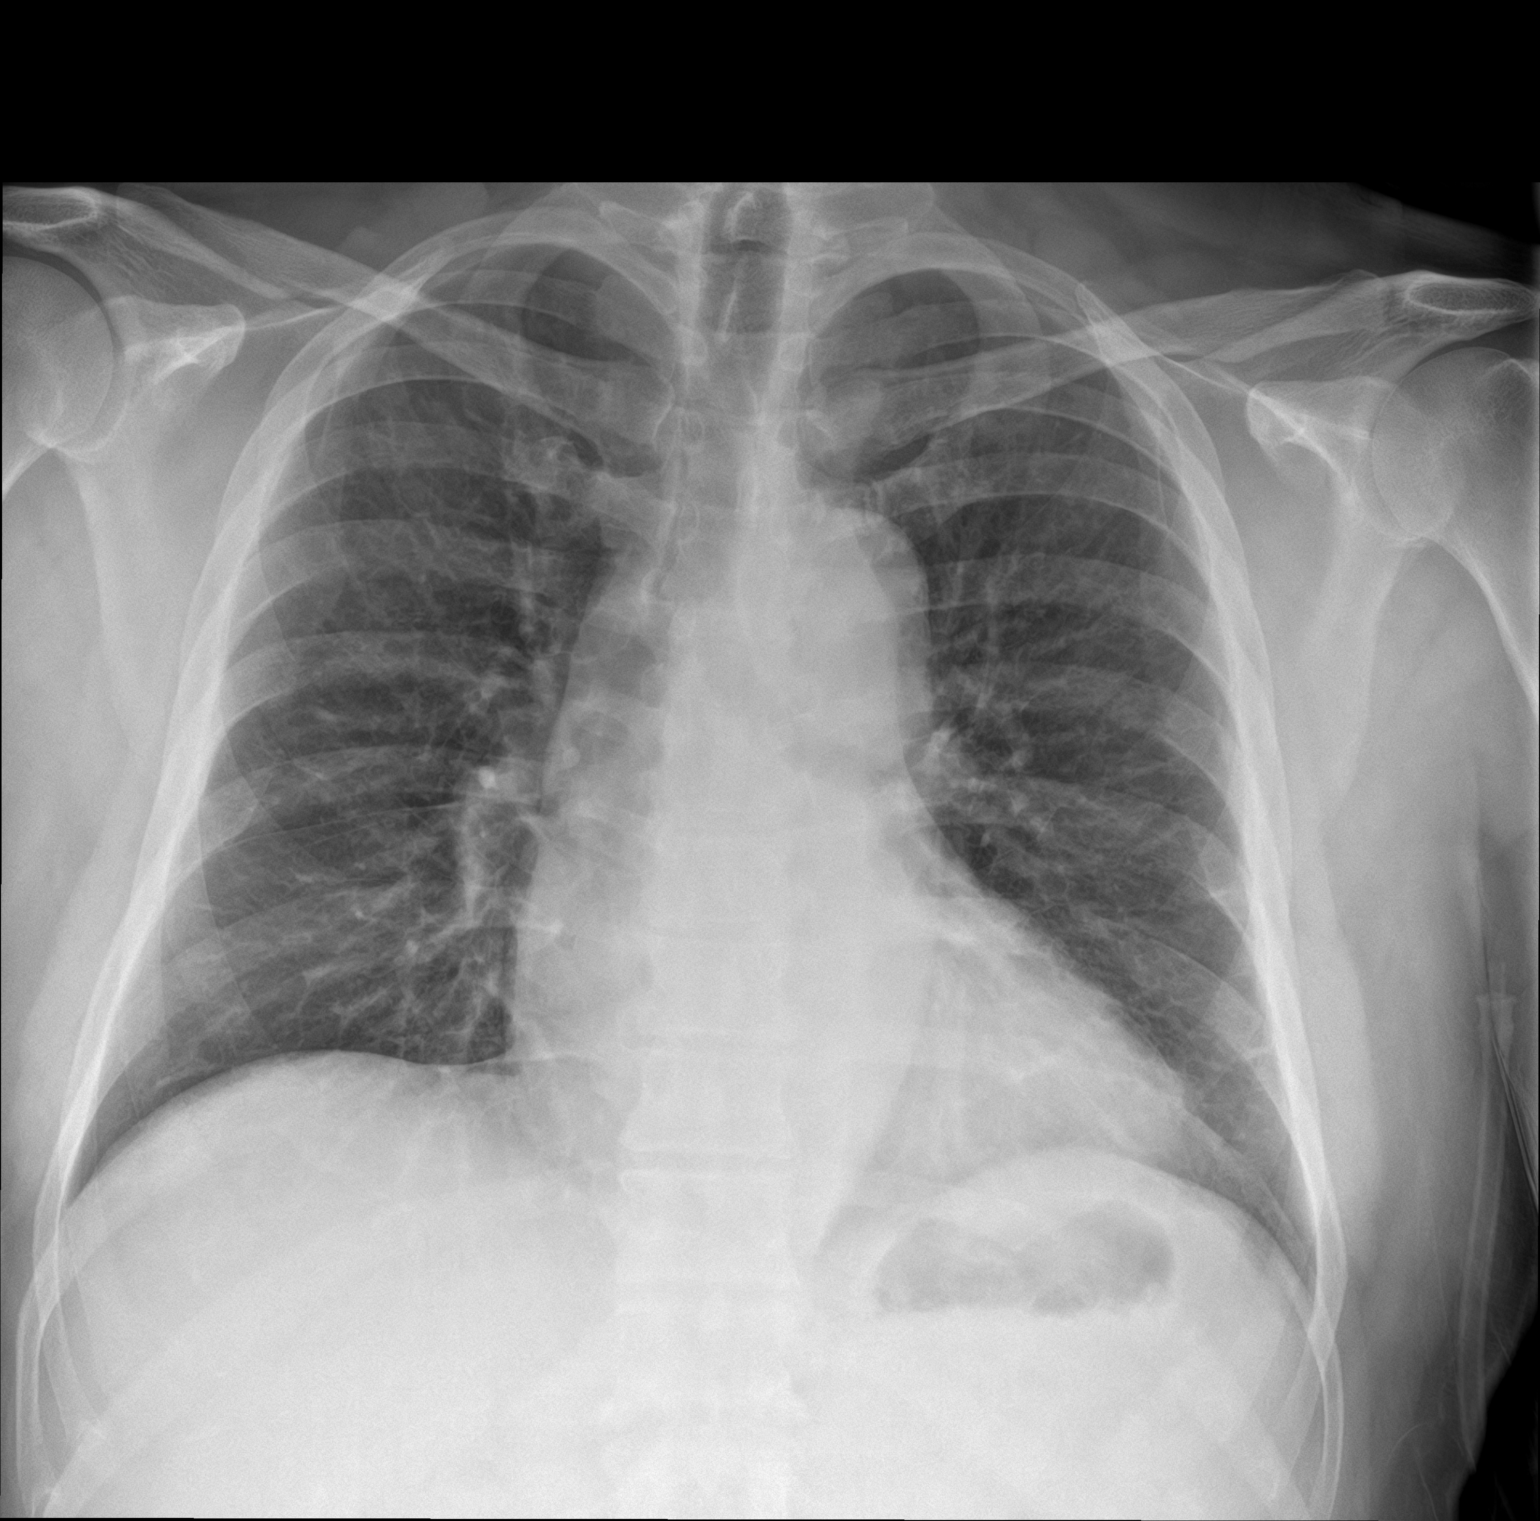

[chest lat]
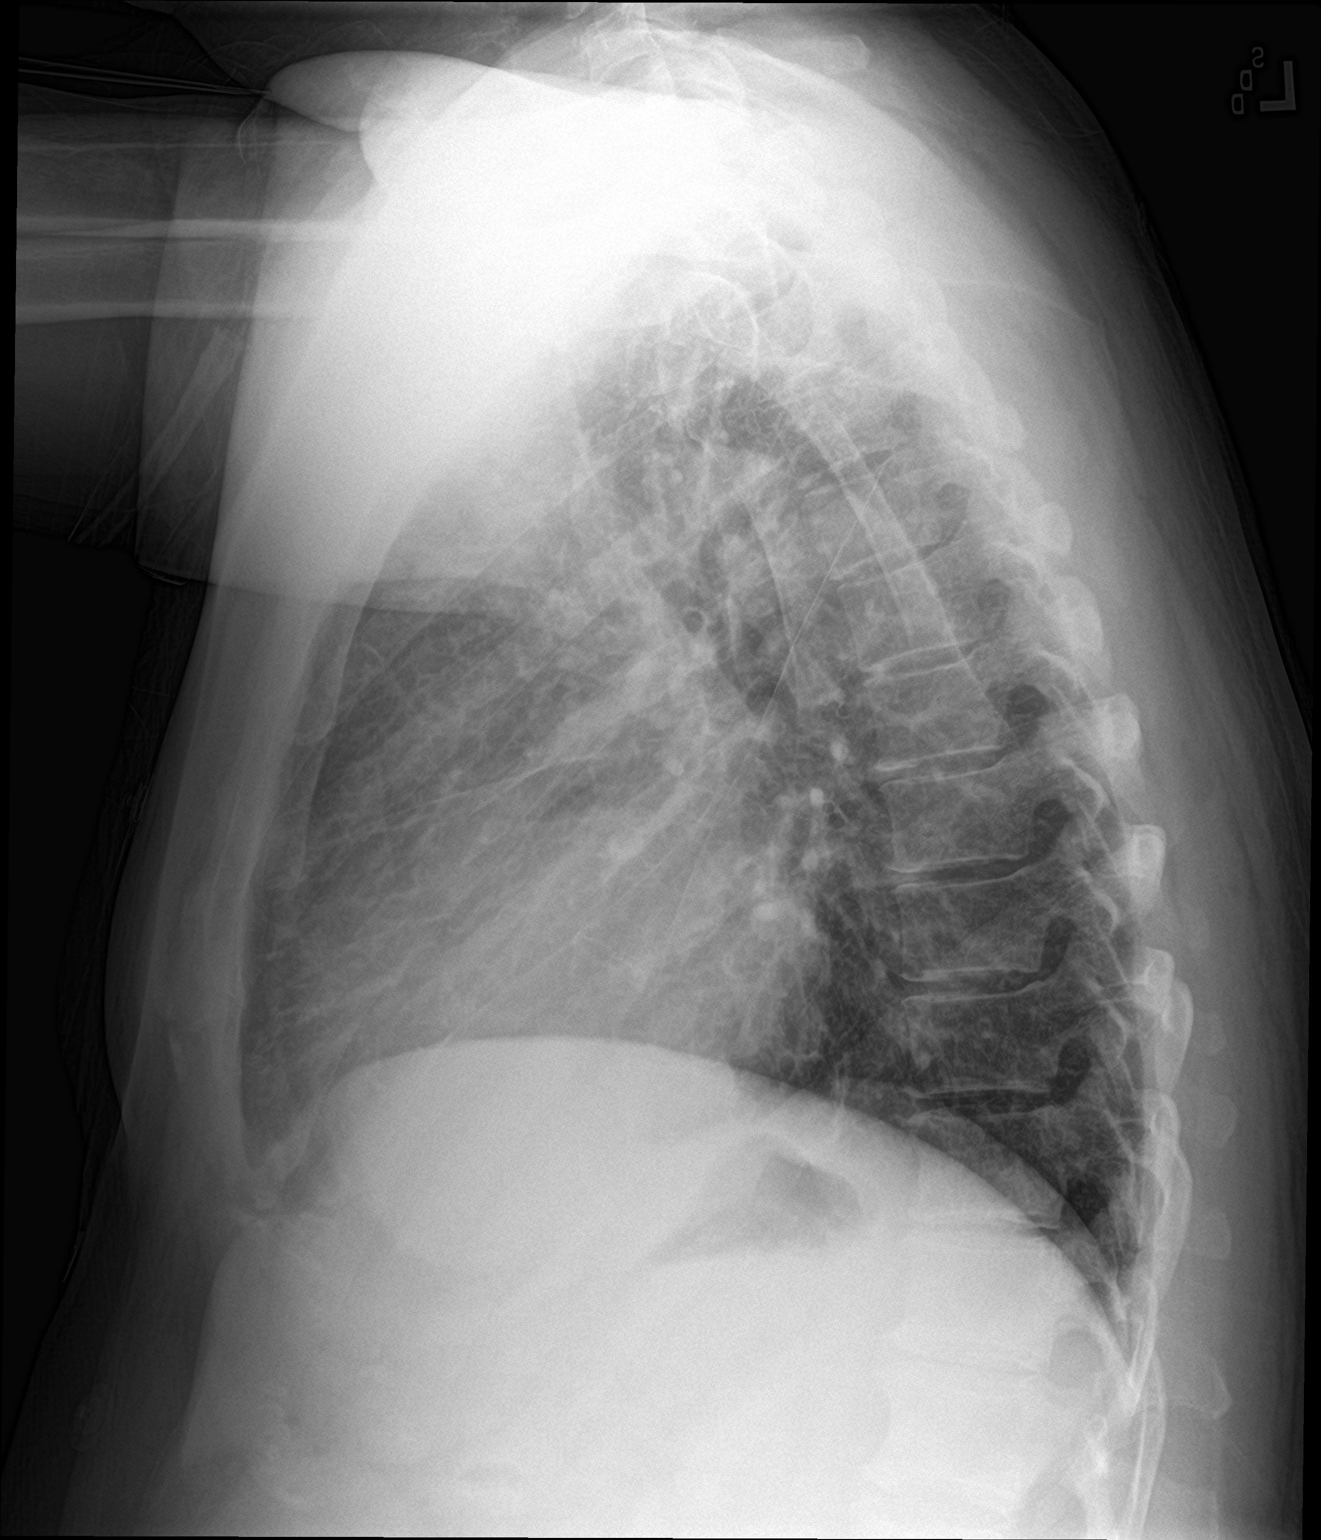

[2 of 2 positions shown; findings below may reference images not displayed]

FINDINGS: The heart size and mediastinal contours are within normal limits.
There is no evidence of acute infiltrate, pleural effusion or
pneumothorax. The visualized skeletal structures are unremarkable.
IMPRESSION: No active cardiopulmonary disease.

## 2022-11-16 NOTE — Progress Notes (Signed)
Daily Session Note  Patient Details  Name: Clarence [name] MRN: [id number] Date of Birth: 12-30-1970 Referring Provider:   Flowsheet Row Pulmonary Rehab from 07/30/2022 in [facility] Cardiac and Pulmonary Rehab  Referring Provider Dr. Yvonne Kendall, MD       Encounter Date: 11/16/2022  Check In:  Session Check In - 11/16/22 1545       Check-In   Supervising physician immediately available to respond to emergencies See telemetry face sheet for immediately available ER MD    Location [facility]-Cardiac & Pulmonary Rehab    Staff Present Susann Givens, RN Mabeline Caras, BS, ACSM CEP, Exercise Physiologist;Joseph Reino [place], [place]    Virtual Visit No    Medication changes reported     No    Fall or balance concerns reported    No    Warm-up and Cool-down Performed on first and last piece of equipment    Resistance Training Performed Yes    VAD Patient? No    PAD/SET Patient? No      Pain Assessment   Currently in Pain? No/denies                Social History   Tobacco Use  Smoking Status Former   Types: Cigars   Quit date: 08/13/2021   Years since quitting: 1.2  Smokeless Tobacco Not on file    Goals Met:  Independence with exercise equipment Exercise tolerated well No report of concerns or symptoms today Strength training completed today  Goals Unmet:  Not Applicable  Comments: Pt able to follow exercise prescription today without complaint.  Will continue to monitor for progression.    Dr. Bethann Punches is Medical Director for [facility] Cardiac Rehabilitation.  Dr. Vida Rigger is Medical Director for [facility] Pulmonary Rehabilitation.

## 2022-11-18 ENCOUNTER — Encounter: Payer: Medicaid Other | Admitting: *Deleted

## 2022-11-18 DIAGNOSIS — I5042 Chronic combined systolic (congestive) and diastolic (congestive) heart failure: Secondary | ICD-10-CM

## 2022-11-18 NOTE — Progress Notes (Signed)
Daily Session Note  Patient Details  Name: TRICE SCHMOCK MRN: [id number] Date of Birth: [birth year] Referring Provider:   Flowsheet Row Pulmonary Rehab from 07/30/2022 in [facility] Cardiac and Pulmonary Rehab  Referring Provider Dr. Yvonne Kendall, MD       Encounter Date: 11/18/2022  Check In:  Session Check In - 11/18/22 1528       Check-In   Supervising physician immediately available to respond to emergencies See telemetry face sheet for immediately available ER MD    Location [facility]-Cardiac & Pulmonary Rehab    Staff Present Susann Givens, RN BSN;Laureen Manson Passey, BS, RRT, CPFT;Megan Katrinka Blazing, RN, ADN    Virtual Visit No    Medication changes reported     No    Fall or balance concerns reported    No    Warm-up and Cool-down Performed on first and last piece of equipment    Resistance Training Performed Yes    VAD Patient? No    PAD/SET Patient? No      Pain Assessment   Currently in Pain? No/denies                Social History   Tobacco Use  Smoking Status Former   Types: Cigars   Quit date: 08/13/2021   Years since quitting: 1.2  Smokeless Tobacco Not on file    Goals Met:  Independence with exercise equipment Exercise tolerated well No report of concerns or symptoms today Strength training completed today  Goals Unmet:  Not Applicable  Comments: Pt able to follow exercise prescription today without complaint.  Will continue to monitor for progression.    Dr. Bethann Punches is Medical Director for [facility] Cardiac Rehabilitation.  Dr. Vida Rigger is Medical Director for [facility] Pulmonary Rehabilitation.

## 2022-11-23 ENCOUNTER — Encounter: Payer: Medicaid Other | Admitting: *Deleted

## 2022-11-23 DIAGNOSIS — I5042 Chronic combined systolic (congestive) and diastolic (congestive) heart failure: Secondary | ICD-10-CM

## 2022-11-23 NOTE — Progress Notes (Signed)
Daily Session Note  Patient Details  Name: Clarence [name] MRN: [id number] Date of Birth: 02-05-1971 Referring Provider:   Flowsheet Row Pulmonary Rehab from 07/30/2022 in [facility] Cardiac and Pulmonary Rehab  Referring Provider Dr. Yvonne Kendall, MD       Encounter Date: 11/23/2022  Check In:  Session Check In - 11/23/22 1537       Check-In   Supervising physician immediately available to respond to emergencies See telemetry face sheet for immediately available ER MD    Location [facility]-Cardiac & Pulmonary Rehab    Staff Present Susann Givens, RN BSN;Laureen Manson Passey, BS, RRT, CPFT;Susanne Bice, RN, BSN, CCRP    Virtual Visit No    Medication changes reported     No    Fall or balance concerns reported    No    Warm-up and Cool-down Performed on first and last piece of equipment    Resistance Training Performed Yes    VAD Patient? No    PAD/SET Patient? No      Pain Assessment   Currently in Pain? No/denies                Social History   Tobacco Use  Smoking Status Former   Types: Cigars   Quit date: 08/13/2021   Years since quitting: 1.2  Smokeless Tobacco Not on file    Goals Met:  Independence with exercise equipment Exercise tolerated well No report of concerns or symptoms today Strength training completed today  Goals Unmet:  Not Applicable  Comments: Pt able to follow exercise prescription today without complaint.  Will continue to monitor for progression.    Dr. Bethann Punches is Medical Director for [facility] Cardiac Rehabilitation.  Dr. Vida Rigger is Medical Director for [facility] Pulmonary Rehabilitation.

## 2022-11-24 ENCOUNTER — Encounter: Payer: Self-pay | Admitting: *Deleted

## 2022-11-24 DIAGNOSIS — I5042 Chronic combined systolic (congestive) and diastolic (congestive) heart failure: Secondary | ICD-10-CM

## 2022-11-24 NOTE — Progress Notes (Signed)
Pulmonary Individual Treatment Plan  Patient Details  Name: Clarence [name] MRN: [id number] Date of Birth: June 13, 1970 Referring Provider:   Flowsheet Row Pulmonary Rehab from 07/30/2022 in [facility] Cardiac and Pulmonary Rehab  Referring Provider Dr. Yvonne Kendall, MD       Initial Encounter Date:  Flowsheet Row Pulmonary Rehab from 07/30/2022 in [facility] Cardiac and Pulmonary Rehab  Date 07/30/22       Visit Diagnosis: Chronic combined systolic and diastolic congestive heart failure (HCC)  Patient's Home Medications on Admission:  Current Outpatient Medications:    acetaminophen (TYLENOL) 325 MG tablet, Take 650 mg by mouth at bedtime., Disp: , Rfl:    aspirin EC 81 MG tablet, Take 1 tablet (81 mg total) by mouth daily. Swallow whole., Disp: 30 tablet, Rfl: 4   atorvastatin (LIPITOR) 80 MG tablet, Take 1 tablet (80 mg total) by mouth every evening., Disp: 90 tablet, Rfl: 3   carvedilol (COREG) 12.5 MG tablet, Take 1 tablet (12.5 mg total) by mouth 2 (two) times daily., Disp: 60 tablet, Rfl: 6   clopidogrel (PLAVIX) 75 MG tablet, Take 1 tablet (75 mg total) by mouth daily., Disp: 30 tablet, Rfl: 3   empagliflozin (JARDIANCE) 10 MG TABS tablet, Take 10 mg by mouth daily., Disp: , Rfl:    ezetimibe (ZETIA) 10 MG tablet, Take 1 tablet (10 mg total) by mouth daily., Disp: 90 tablet, Rfl: 3   famotidine (PEPCID) 10 MG tablet, Take 10 mg by mouth at bedtime. As needed, Disp: , Rfl:    fenofibrate (TRICOR) 48 MG tablet, Take 1 tablet (48 mg total) by mouth daily., Disp: 30 tablet, Rfl: 8   furosemide (LASIX) 20 MG tablet, Take 1 tablet (20 mg total) by mouth daily as needed. For weight gain of 3 lb in 24 hours or 5 lbs in one week, Disp: 90 tablet, Rfl: 1   isosorbide mononitrate (IMDUR) 30 MG 24 hr tablet, Take 1 tablet (30 mg total) by mouth daily., Disp: 90 tablet, Rfl: 1   nitroGLYCERIN (NITROSTAT) 0.4 MG SL tablet, Place 1 tablet (0.4 mg total) under the tongue every 5 (five) minutes as  needed for chest pain., Disp: 25 tablet, Rfl: 3   ondansetron (ZOFRAN) 4 MG tablet, Take 1 tablet (4 mg total) by mouth every 8 (eight) hours as needed for nausea or vomiting., Disp: 20 tablet, Rfl: 0   pantoprazole (PROTONIX) 40 MG tablet, Take 1 tablet (40 mg total) by mouth daily., Disp: 30 tablet, Rfl: 11   ranolazine (RANEXA) 500 MG 12 hr tablet, Take 500 mg by mouth 2 (two) times daily., Disp: , Rfl:    sacubitril-valsartan (ENTRESTO) 24-26 MG, Take 1 tablet by mouth 2 (two) times daily., Disp: 180 tablet, Rfl: 1   spironolactone (ALDACTONE) 25 MG tablet, Take 0.5 tablets (12.5 mg total) by mouth daily., Disp: 45 tablet, Rfl: 1  Current Facility-Administered Medications:    sodium chloride flush (NS) 0.9 % injection 3 mL, 3 mL, Intravenous, Q12H, Laurey Morale, MD  Past Medical History: Past Medical History:  Diagnosis Date   Ascending Aortic Dilation    a. 09/2021 Echo: Asc Ao 40mm.   CAD (coronary artery disease)    a. 07/2021 Cath: LM nl, LAD 39m, D2 40, LCX 65p/m, OM1 90, RCA 121m w/ L-.R collats to RPDA from septal 1/2-->Med rx.   Chronic HFrEF (heart failure with reduced ejection fraction) (HCC)    a. 07/2021 Echo: EF 30-35%, glob HK, mod LVH, GrII DD, nl RV fxn, mildly  dil LA, mild-mod MR; b. 09/2021 Echo: EF 35-40%, glob HK, mod LVH, GrI DD, nl RV fxn, mildly dil RA, mild MR, Asc Ao 40mm.   CKD (chronic kidney disease) stage 2, GFR 60-89 ml/min    Crohn's disease (HCC)    Hypertension    Mixed Ischemic & Nonischemic cardiomyopathy    a. 07/2021 Echo: EF 30-35%; b. 07/2021 Cath: Occluded RCA w/ mod LAD/LCX dzs, and severe OM1 dzs-->Med Rx; c. 09/2021 Echo: EF 35-40%.   PAH (pulmonary artery hypertension) (HCC)    a. 07/2021 RHC: PA 67/30 (42).   Proteinuria     Tobacco Use: Social History   Tobacco Use  Smoking Status Former   Types: Cigars   Quit date: 08/13/2021   Years since quitting: 1.2  Smokeless Tobacco Not on file    Labs: Review Flowsheet  More data exists       Latest Ref Rng & Units 10/07/2021 01/27/2022 07/15/2022 09/21/2022 09/29/2022  Labs for ITP Cardiac and Pulmonary Rehab  Cholestrol 0 - 200 mg/dL 829  562  130  865  -  LDL (calc) 0 - 99 mg/dL 43  70  81  59  -  HDL-C >40 mg/dL 30  35  39  33  -  Trlycerides <150 mg/dL 784  696  295  284  -  Bicarbonate 20.0 - 28.0 mmol/L - - - - 22.9  23.5   TCO2 22 - 32 mmol/L - - - - 24  25   Acid-base deficit 0.0 - 2.0 mmol/L - - - - 3.0  3.0   O2 Saturation % - - - - 71  68      Pulmonary Assessment Scores:  Pulmonary Assessment Scores     Row Name 07/28/22 1459 07/30/22 1109       ADL UCSD   ADL Phase Entry Entry    SOB Score total 54 54    Rest 5 5    Walk 3 3    Stairs 3 3    Bath 3 3    Dress 2 2    Shop 3 3      CAT Score   CAT Score 22 22      mMRC Score   mMRC Score -- 2             UCSD: Self-administered rating of dyspnea associated with activities of daily living (ADLs) 6-point scale (0 = "not at all" to 5 = "maximal or unable to do because of breathlessness")  Scoring Scores range from 0 to 120.  Minimally important difference is 5 units  CAT: CAT can identify the health impairment of COPD patients and is better correlated with disease progression.  CAT has a scoring range of zero to 40. The CAT score is classified into four groups of low (less than 10), medium (10 - 20), high (21-30) and very high (31-40) based on the impact level of disease on health status. A CAT score over 10 suggests significant symptoms.  A worsening CAT score could be explained by an exacerbation, poor medication adherence, poor inhaler technique, or progression of COPD or comorbid conditions.  CAT MCID is 2 points  mMRC: mMRC (Modified Medical Research Council) Dyspnea Scale is used to assess the degree of baseline functional disability in patients of respiratory disease due to dyspnea. No minimal important difference is established. A decrease in score of 1 point or greater is considered  a positive change.   Pulmonary Function Assessment:  Exercise Target Goals: Exercise Program Goal: Individual exercise prescription set using results from initial 6 min walk test and THRR while considering  patient's activity barriers and safety.   Exercise Prescription Goal: Initial exercise prescription builds to 30-45 minutes a day of aerobic activity, 2-3 days per week.  Home exercise guidelines will be given to patient during program as part of exercise prescription that the participant will acknowledge.  Education: Aerobic Exercise: - Group verbal and visual presentation on the components of exercise prescription. Introduces F.I.T.T principle from ACSM for exercise prescriptions.  Reviews F.I.T.T. principles of aerobic exercise including progression. Written material given at graduation. Flowsheet Row Pulmonary Rehab from 11/11/2022 in [facility] Cardiac and Pulmonary Rehab  Education need identified 07/30/22  Date 08/12/22  Educator KW  Instruction Review Code 1- [employer] Understanding       Education: Resistance Exercise: - Group verbal and visual presentation on the components of exercise prescription. Introduces F.I.T.T principle from ACSM for exercise prescriptions  Reviews F.I.T.T. principles of resistance exercise including progression. Written material given at graduation.    Education: Exercise & Equipment Safety: - Individual verbal instruction and demonstration of equipment use and safety with use of the equipment. Flowsheet Row Pulmonary Rehab from 11/11/2022 in [facility] Cardiac and Pulmonary Rehab  Date 07/30/22  Educator NT  Instruction Review Code 1- Verbalizes Understanding       Education: Exercise Physiology & General Exercise Guidelines: - Group verbal and written instruction with models to review the exercise physiology of the cardiovascular system and associated critical values. Provides general exercise guidelines with specific guidelines to those with heart  or lung disease.  Flowsheet Row Pulmonary Rehab from 11/11/2022 in [facility] Cardiac and Pulmonary Rehab  Date 08/05/22  Educator KW  Instruction Review Code 1- [employer] Understanding       Education: Flexibility, Balance, Mind/Body Relaxation: - Group verbal and visual presentation with interactive activity on the components of exercise prescription. Introduces F.I.T.T principle from ACSM for exercise prescriptions. Reviews F.I.T.T. principles of flexibility and balance exercise training including progression. Also discusses the mind body connection.  Reviews various relaxation techniques to help reduce and manage stress (i.e. Deep breathing, progressive muscle relaxation, and visualization). Balance handout provided to take home. Written material given at graduation.   Activity Barriers & Risk Stratification:  Activity Barriers & Cardiac Risk Stratification - 07/30/22 1111       Activity Barriers & Cardiac Risk Stratification   Activity Barriers Joint Problems;Deconditioning;Shortness of Breath   left shoulder pain            6 Minute Walk:  6 Minute Walk     Row Name 07/30/22 1343         6 Minute Walk   Phase Initial     Distance 940 feet     Walk Time 6 minutes     # of Rest Breaks 0     MPH 1.78     METS 2.78     RPE 13     Perceived Dyspnea  2     VO2 Peak 9.73     Symptoms Yes (comment)     Comments SOB     Resting HR 61 bpm     Resting BP 104/62     Resting Oxygen Saturation  94 %     Exercise Oxygen Saturation  during 6 min walk 94 %     Max Ex. HR 90 bpm     Max Ex. BP 124/72     2  Minute Post BP 100/64       Interval HR   1 Minute HR 83     2 Minute HR 88     3 Minute HR 86     4 Minute HR 86     5 Minute HR 90     6 Minute HR 84     2 Minute Post HR 61     Interval Heart Rate? Yes       Interval Oxygen   Interval Oxygen? Yes     Baseline Oxygen Saturation % 94 %     1 Minute Oxygen Saturation % 94 %     1 Minute Liters of Oxygen 0 L  RA      2 Minute Liters of Oxygen 0 L  RA     3 Minute Oxygen Saturation % 94 %     3 Minute Liters of Oxygen 0 L  RA     4 Minute Oxygen Saturation % 94 %     4 Minute Liters of Oxygen 0 L  RA     5 Minute Oxygen Saturation % 94 %     5 Minute Liters of Oxygen 0 L  RA     6 Minute Oxygen Saturation % 94 %     6 Minute Liters of Oxygen 0 L  RA     2 Minute Post Oxygen Saturation % 96 %     2 Minute Post Liters of Oxygen 0 L  RA             Oxygen Initial Assessment:  Oxygen Initial Assessment - 07/28/22 1416       Home Oxygen   Home Oxygen Device None    Sleep Oxygen Prescription None    Home Exercise Oxygen Prescription None    Home Resting Oxygen Prescription None    Compliance with Home Oxygen Use Yes      Intervention   Short Term Goals To learn and understand importance of maintaining oxygen saturations>88%;To learn and demonstrate proper pursed lip breathing techniques or other breathing techniques. ;To learn and understand importance of monitoring SPO2 with pulse oximeter and demonstrate accurate use of the pulse oximeter.    Long  Term Goals Maintenance of O2 saturations>88%;Compliance with respiratory medication;Verbalizes importance of monitoring SPO2 with pulse oximeter and return demonstration;Exhibits proper breathing techniques, such as pursed lip breathing or other method taught during program session             Oxygen Re-Evaluation:  Oxygen Re-Evaluation     Row Name 08/03/22 1540 08/17/22 1559 11/16/22 1550         Program Oxygen Prescription   Program Oxygen Prescription -- None None       Home Oxygen   Home Oxygen Device -- None None     Sleep Oxygen Prescription -- None None     Home Exercise Oxygen Prescription -- None None     Home Resting Oxygen Prescription -- None None       Goals/Expected Outcomes   Short Term Goals -- To learn and demonstrate proper pursed lip breathing techniques or other breathing techniques.  To learn and understand  importance of maintaining oxygen saturations>88%;To learn and understand importance of monitoring SPO2 with pulse oximeter and demonstrate accurate use of the pulse oximeter.     Long  Term Goals -- Exhibits proper breathing techniques, such as pursed lip breathing or other method taught during program session Verbalizes importance of monitoring SPO2 with pulse  oximeter and return demonstration;Maintenance of O2 saturations>88%     Comments Reviewed PLB technique with pt.  Talked about how it works and it's importance in maintaining their exercise saturations. Informed patient how to perform the Pursed Lipped breathing technique. Told patient to Inhale through the nose and out the mouth with pursed lips to keep their airways open, help oxygenate them better, practice when at rest or doing strenuous activity. Patient Verbalizes understanding of technique and will work on and be reiterated during [facility]. He does not have a pulse oximeter to check her/his oxygen saturation at home. Informed him where to get one and explained why it is important to have one. Reviewed that oxygen saturations should be 88 percent and above. Patient verbalizes understanding.     Goals/Expected Outcomes Short: Become more profiecient at using PLB.   Long: Become independent at using PLB. Short: use PLB with exertion. Long: use PLB on exertion proficiently and independently. Short: monitor oxygen at home with exertion. Long: maintain oxygen saturations above 88 percent independently.              Oxygen Discharge (Final Oxygen Re-Evaluation):  Oxygen Re-Evaluation - 11/16/22 1550       Program Oxygen Prescription   Program Oxygen Prescription None      Home Oxygen   Home Oxygen Device None    Sleep Oxygen Prescription None    Home Exercise Oxygen Prescription None    Home Resting Oxygen Prescription None      Goals/Expected Outcomes   Short Term Goals To learn and understand importance of maintaining oxygen  saturations>88%;To learn and understand importance of monitoring SPO2 with pulse oximeter and demonstrate accurate use of the pulse oximeter.    Long  Term Goals Verbalizes importance of monitoring SPO2 with pulse oximeter and return demonstration;Maintenance of O2 saturations>88%    Comments He does not have a pulse oximeter to check her/his oxygen saturation at home. Informed him where to get one and explained why it is important to have one. Reviewed that oxygen saturations should be 88 percent and above. Patient verbalizes understanding.    Goals/Expected Outcomes Short: monitor oxygen at home with exertion. Long: maintain oxygen saturations above 88 percent independently.             Initial Exercise Prescription:  Initial Exercise Prescription - 07/30/22 1400       Date of Initial Exercise RX and Referring Provider   Date 07/30/22    Referring Provider Dr. Yvonne Kendall, MD      Oxygen   Maintain Oxygen Saturation 88% or higher      Treadmill   MPH 1.8    Grade 0.5    Minutes 15    METs 2.5      Recumbant Bike   Level 1    RPM 50    Watts 25    Minutes 15    METs 2.78      NuStep   Level 2    SPM 80    Minutes 15    METs 2.78      REL-XR   Level 2    Speed 50    Minutes 15    METs 2.78      Prescription Details   Frequency (times per week) 3    Duration Progress to 30 minutes of continuous aerobic without signs/symptoms of physical distress      Intensity   THRR 40-80% of Max Heartrate 103-145    Ratings of Perceived Exertion 11-13  Perceived Dyspnea 0-4      Progression   Progression Continue to progress workloads to maintain intensity without signs/symptoms of physical distress.      Resistance Training   Training Prescription Yes    Weight 4 lb    Reps 10-15             Perform Capillary Blood Glucose checks as needed.  Exercise Prescription Changes:   Exercise Prescription Changes     Row Name 07/30/22 1400 08/12/22 1400  08/27/22 1400 09/01/22 0800 09/09/22 1300     Response to Exercise   Blood Pressure (Admit) 104/62 102/60 110/68 -- 110/60   Blood Pressure (Exercise) 124/72 122/68 148/78 -- --   Blood Pressure (Exit) 100/64 98/60 124/78 -- 92/64   Heart Rate (Admit) 61 bpm 78 bpm 63 bpm -- 69 bpm   Heart Rate (Exercise) 90 bpm 102 bpm 94 bpm -- 86 bpm   Heart Rate (Exit) 61 bpm 83 bpm 76 bpm -- 73 bpm   Oxygen Saturation (Admit) 94 % 97 % 97 % -- 97 %   Oxygen Saturation (Exercise) 94 % 95 % 93 % -- 93 %   Oxygen Saturation (Exit) 96 % 95 % 95 % -- 94 %   Rating of Perceived Exertion (Exercise) 13 15 15  -- 15   Perceived Dyspnea (Exercise) 2 2 3  -- 2   Symptoms SOB SOB SOB -- SOB   Comments Results 3rd full day of exercise -- -- --   Duration -- Progress to 30 minutes of  aerobic without signs/symptoms of physical distress Continue with 30 min of aerobic exercise without signs/symptoms of physical distress. -- Continue with 30 min of aerobic exercise without signs/symptoms of physical distress.   Intensity -- THRR unchanged THRR unchanged -- THRR unchanged     Progression   Progression -- Continue to progress workloads to maintain intensity without signs/symptoms of physical distress. Continue to progress workloads to maintain intensity without signs/symptoms of physical distress. -- Continue to progress workloads to maintain intensity without signs/symptoms of physical distress.   Average METs -- 2.6 2.76 -- 2.33     Resistance Training   Training Prescription -- Yes Yes -- Yes   Weight -- 4 lb 4 lb -- 5 lb   Reps -- 10-15 10-15 -- 10-15     Interval Training   Interval Training -- No No -- No     Treadmill   MPH -- 1.8 1.9 -- 1.9   Grade -- 2 1.5 -- 0   Minutes -- 15 15 -- 15   METs -- 2.87 2.85 -- 2.45     NuStep   Level -- 2 2 -- --   Minutes -- 15 15 -- --   METs -- 2.5 2.3 -- --     Recumbant Elliptical   Level -- -- 1 -- --   Minutes -- -- 15 -- --   METs -- -- 1.4 -- --      Elliptical   Level -- -- -- -- 1   Minutes -- -- -- -- 15     REL-XR   Level -- 2 3 -- 4   Minutes -- 15 15 -- 15   METs -- 2.6 3.7 -- 2.6     T5 Nustep   Level -- 1 4 -- --   Minutes -- 15 15 -- --   METs -- 2.3 2.2 -- --     Home Exercise Plan   Plans to  continue exercise at -- -- -- Home (comment)  walking Home (comment)  walking   Frequency -- -- -- Add 2 additional days to program exercise sessions. Add 2 additional days to program exercise sessions.   Initial Home Exercises Provided -- -- -- 08/31/22 08/31/22     Oxygen   Maintain Oxygen Saturation -- 88% or higher 88% or higher -- 88% or higher    Row Name 09/22/22 1500 11/18/22 0800           Response to Exercise   Blood Pressure (Admit) 82/58 100/60      Blood Pressure (Exit) 84/60 106/60      Heart Rate (Admit) 64 bpm 82 bpm      Heart Rate (Exercise) 76 bpm 85 bpm      Heart Rate (Exit) 67 bpm 68 bpm      Oxygen Saturation (Admit) 96 % 92 %      Oxygen Saturation (Exercise) 95 % 92 %      Oxygen Saturation (Exit) 96 % 96 %      Rating of Perceived Exertion (Exercise) 12 15      Perceived Dyspnea (Exercise) -- 3      Symptoms SOB SOB      Duration Continue with 30 min of aerobic exercise without signs/symptoms of physical distress. Continue with 30 min of aerobic exercise without signs/symptoms of physical distress.      Intensity THRR unchanged THRR unchanged        Progression   Progression Continue to progress workloads to maintain intensity without signs/symptoms of physical distress. Continue to progress workloads to maintain intensity without signs/symptoms of physical distress.      Average METs 1.8 1.87        Resistance Training   Training Prescription Yes Yes      Weight 5 lb 5 lb      Reps 10-15 10-15        Interval Training   Interval Training No No        Recumbant Bike   Level -- 7      Watts -- 34      Minutes -- 15        NuStep   Level -- 6      Minutes -- 30      METs -- 2         T5 Nustep   Level 4 --      Minutes 30 --      METs 1.8 --        Home Exercise Plan   Plans to continue exercise at Home (comment)  walking Home (comment)  walking      Frequency Add 2 additional days to program exercise sessions. Add 2 additional days to program exercise sessions.      Initial Home Exercises Provided 08/31/22 08/31/22        Oxygen   Maintain Oxygen Saturation 88% or higher 88% or higher               Exercise Comments:   Exercise Comments     Row Name 08/03/22 1539           Exercise Comments First full day of exercise!  Patient was oriented to gym and equipment including functions, settings, policies, and procedures.  Patient's individual exercise prescription and treatment plan were reviewed.  All starting workloads were established based on the results of the 6 minute walk test done at initial orientation visit.  The  plan for exercise progression was also introduced and progression will be customized based on patient's performance and goals.                Exercise Goals and Review:   Exercise Goals     Row Name 07/30/22 1112             Exercise Goals   Increase Physical Activity Yes       Intervention Provide advice, education, support and counseling about physical activity/exercise needs.;Develop an individualized exercise prescription for aerobic and resistive training based on initial evaluation findings, risk stratification, comorbidities and participant's personal goals.       Expected Outcomes Short Term: Attend rehab on a regular basis to increase amount of physical activity.;Long Term: Add in home exercise to make exercise part of routine and to increase amount of physical activity.;Long Term: Exercising regularly at least 3-5 days a week.       Increase Strength and Stamina Yes       Intervention Provide advice, education, support and counseling about physical activity/exercise needs.;Develop an individualized exercise  prescription for aerobic and resistive training based on initial evaluation findings, risk stratification, comorbidities and participant's personal goals.       Expected Outcomes Short Term: Increase workloads from initial exercise prescription for resistance, speed, and METs.;Long Term: Improve cardiorespiratory fitness, muscular endurance and strength as measured by increased METs and functional capacity ( );Short Term: Perform resistance training exercises routinely during rehab and add in resistance training at home       Able to understand and use rate of perceived exertion (RPE) scale Yes       Intervention Provide education and explanation on how to use RPE scale       Expected Outcomes Short Term: Able to use RPE daily in rehab to express subjective intensity level;Long Term:  Able to use RPE to guide intensity level when exercising independently       Able to understand and use Dyspnea scale Yes       Intervention Provide education and explanation on how to use Dyspnea scale       Expected Outcomes Short Term: Able to use Dyspnea scale daily in rehab to express subjective sense of shortness of breath during exertion;Long Term: Able to use Dyspnea scale to guide intensity level when exercising independently       Knowledge and understanding of Target Heart Rate Range (THRR) Yes       Intervention Provide education and explanation of THRR including how the numbers were predicted and where they are located for reference       Expected Outcomes Short Term: Able to state/look up THRR;Long Term: Able to use THRR to govern intensity when exercising independently;Short Term: Able to use daily as guideline for intensity in rehab       Able to check pulse independently Yes       Intervention Provide education and demonstration on how to check pulse in carotid and radial arteries.;Review the importance of being able to check your own pulse for safety during independent exercise       Expected Outcomes  Short Term: Able to explain why pulse checking is important during independent exercise;Long Term: Able to check pulse independently and accurately       Understanding of Exercise Prescription Yes       Intervention Provide education, explanation, and written materials on patient's individual exercise prescription       Expected Outcomes Short Term: Able to explain  program exercise prescription;Long Term: Able to explain home exercise prescription to exercise independently                Exercise Goals Re-Evaluation :  Exercise Goals Re-Evaluation     Row Name 08/03/22 1539 08/12/22 1426 08/27/22 1446 09/01/22 0822 09/09/22 1359     Exercise Goal Re-Evaluation   Exercise Goals Review Increase Physical Activity;Able to understand and use rate of perceived exertion (RPE) scale;Knowledge and understanding of Target Heart Rate Range (THRR);Understanding of Exercise Prescription;Increase Strength and Stamina;Able to check pulse independently;Able to understand and use Dyspnea scale Increase Physical Activity;Increase Strength and Stamina;Understanding of Exercise Prescription Increase Physical Activity;Increase Strength and Stamina;Understanding of Exercise Prescription Increase Physical Activity;Increase Strength and Stamina;Understanding of Exercise Prescription;Able to understand and use rate of perceived exertion (RPE) scale;Able to understand and use Dyspnea scale;Knowledge and understanding of Target Heart Rate Range (THRR);Able to check pulse independently Increase Physical Activity;Increase Strength and Stamina;Understanding of Exercise Prescription   Comments Reviewed RPE scale, THR and program prescription with pt today.  Pt voiced understanding and was given a copy of goals to take home. Clarence [name] is off to a good start with his rehab sessions. He has been able to do his initial exercise prescription and even went up to 2% incline on the treadmill. He is not quite hitting his THR but will  continue to monitor that as he progresses in the program. Clarence [name] continues to do well in rehab. He recently increased his overall average MET level to 2.76 METs. He also improved to level 4 on the T5 nustep and level 3 on the XR. He began using the recumbent elliptical at level 1 as well. He also increased his treadmill speed to 1.9 mph while maintaining an incline of 1.5%. We will continue to monitor his progress in the program. Reviewed home exercise with pt today.  Pt plans to walk at home for exercise.  He is going to build up to 30 min.  Reviewed THR, pulse, RPE, sign and symptoms, pulse oximetery and when to call 911 or MD.  Also discussed weather considerations and indoor options.  Pt voiced understanding. Unnamed continues to do well in rehab. He did take some incline off of his treadmill. Some days he experiences low BP and nausea and accommodates workloads based off of that. He did try out the elliptical one day and was able to do level 1. He started using 5 lb handweights as well! We will continue to monitor.   Expected Outcomes Short: Use RPE daily to regulate intensity.  Long: Follow program prescription in THR. Short: Continue initial exercise prescription Long: Increase overall strength and stamina Short: Continue to progressively increase treadmill workload. Long: Continue to improve strength and stamina. Short: Start to build up time walking at home Long; conitnue to exercise indpendently Short: Slowly add incline back to treadmill when appropriate Long: Continue to increase overall MET level and stamina    Row Name 09/22/22 1503 10/06/22 1549 10/22/22 1511 11/05/22 1636 11/18/22 0848     Exercise Goal Re-Evaluation   Exercise Goals Review Increase Physical Activity;Increase Strength and Stamina;Understanding of Exercise Prescription Increase Physical Activity;Increase Strength and Stamina;Understanding of Exercise Prescription Increase Physical Activity;Increase Strength and  Stamina;Understanding of Exercise Prescription Increase Physical Activity;Increase Strength and Stamina;Understanding of Exercise Prescription Increase Physical Activity;Increase Strength and Stamina;Understanding of Exercise Prescription   Comments Clarence [name] has only attended rehab once since the last review due to issues with low BP and chest pain. During his one session  he was able to work at level 4 on the T5 nustep for 30 minutes. He has a heart cath scheduled for 09/29/2022. He plans to return after this procedure. We will continue to monitor his progress when he returns to the program. Clarence [name] has been placed on a medical hold as he has been getting evaluated for symptoms with testing including a heart cath. RN spoke with patient yesterday who advised patient to speak with his heart failure doctor at his f/u appt on 5/29 to seek clearance to return to rehab. Per patient, he has still been experiencing symptoms and therefore we need his MD to state whether or not he is cleared to exercise. We recently called to check on patient. He has been out on medical hold with chest pain and heart failure work up. He has not attended since 09/07/22 and will need clearance to return. We will continue to monitor his progress when he returns to the program. We recently called to check on patient. He has been out on medical hold with chest pain and heart failure work up. He has not attended since 09/07/22 and will need clearance to return. We will continue to monitor his progress when he returns to the program. Clarence [name] returned to rehab for two sessions after being out since 09/07/22. During his two sessions he was able to improve to level 6 on the T4 nustep and level 7 on the recumbent bike. He also continues to use 5 lb hand weights for resistance training. We will continue to monitor his progress in the program.   Expected Outcomes Short: Return to rehab when appropriate. Long: Continue to improve strength and stamina. Short:  Talk to doctor and receive clearance to exercise Long: Graduate from [employer] Short: Talk to doctor and receive clearance to exercise. Long: Graduate from [employer]. Short: Talk to doctor and receive clearance to exercise. Long: Graduate from [employer]. Short: Return to walking in rehab. Long: Continue to improve strength and stamina.            Discharge Exercise Prescription (Final Exercise Prescription Changes):  Exercise Prescription Changes - 11/18/22 0800       Response to Exercise   Blood Pressure (Admit) 100/60    Blood Pressure (Exit) 106/60    Heart Rate (Admit) 82 bpm    Heart Rate (Exercise) 85 bpm    Heart Rate (Exit) 68 bpm    Oxygen Saturation (Admit) 92 %    Oxygen Saturation (Exercise) 92 %    Oxygen Saturation (Exit) 96 %    Rating of Perceived Exertion (Exercise) 15    Perceived Dyspnea (Exercise) 3    Symptoms SOB    Duration Continue with 30 min of aerobic exercise without signs/symptoms of physical distress.    Intensity THRR unchanged      Progression   Progression Continue to progress workloads to maintain intensity without signs/symptoms of physical distress.    Average METs 1.87      Resistance Training   Training Prescription Yes    Weight 5 lb    Reps 10-15      Interval Training   Interval Training No      Recumbant Bike   Level 7    Watts 34    Minutes 15      NuStep   Level 6    Minutes 30    METs 2      Home Exercise Plan   Plans to continue exercise at Home (comment)   walking  Frequency Add 2 additional days to program exercise sessions.    Initial Home Exercises Provided 08/31/22      Oxygen   Maintain Oxygen Saturation 88% or higher             Nutrition:  Target Goals: Understanding of nutrition guidelines, daily intake of sodium 1500mg , cholesterol 200mg , calories 30% from fat and 7% or less from saturated fats, daily to have 5 or more servings of fruits and vegetables.  Education: All About Nutrition: -Group  instruction provided by verbal, written material, interactive activities, discussions, models, and posters to present general guidelines for heart healthy nutrition including fat, fiber, MyPlate, the role of sodium in heart healthy nutrition, utilization of the nutrition label, and utilization of this knowledge for meal planning. Follow up email sent as well. Written material given at graduation. Flowsheet Row Pulmonary Rehab from 11/11/2022 in [facility] Cardiac and Pulmonary Rehab  Education need identified 07/30/22  Date 09/02/22  [part 2]  Educator KW  Instruction Review Code 1- Verbalizes Understanding       Biometrics:  Pre Biometrics - 07/30/22 1112       Pre Biometrics   Height 5' 9.75" (1.772 m)    Weight 225 lb 9.6 oz (102.3 kg)    Waist Circumference 43 inches    Hip Circumference 44 inches    Waist to Hip Ratio 0.98 %    BMI (Calculated) 32.59    Single Leg Stand 30 seconds              Nutrition Therapy Plan and Nutrition Goals:  Nutrition Therapy & Goals - 07/30/22 1038       Nutrition Therapy   Diet Heart healthy, low Na    Drug/Food Interactions Statins/Certain Fruits    Protein (specify units) 80-85g    Fiber 35 grams    Whole Grain Foods 3 servings    Saturated Fats 16 max. grams    Fruits and Vegetables 8 servings/day    Sodium 2 grams      Personal Nutrition Goals   Nutrition Goal ST: practice MyPlate guidelines, practice reading food labels  LT: limit Na <2g/day, limit saturated fat <16g/day, aim for 30-35g of fiber per day    Comments 52 y.o. M admitted to pulmonary rehab for Chronic combined systolic and diastolic congestive heart failure. PMHx includes CAD, CKD stg 2, crohn's disease, HTN, pulmonary artery hypertension. Reviewed relevant medications atorvastatin, jardiance, zetia, pepcid, furosemide. Reviewed most recent labs. Clarence [name] reports that his Crohns has been well controlled and his main trigger foods are certain types of processed meats; eating  smaller/more frequent meals help to manage his symptoms as well. Clarence [name] reports limiting chinese food, pizza, and fried foods. He has been trying to use a grill for his cooking to avoid lots of added fat or grease. He has been trying to cut back on soda and now only has it occassionally. Discussed food patterns and heart healthy eating as well as MyPlate. Encouagred him to read labels, choose liquid cooking oils, and to include protein, healthy fat, and fiber at meals to make them more nutrient dense and satiating. Discussed CKD stg 2 MNT and how a modest protei is recommeneded (0.8 g/kg/d) - encouraged to be mindful of animal protein portions and to include plant protein.      Intervention Plan   Intervention Prescribe, educate and counsel regarding individualized specific dietary modifications aiming towards targeted core components such as weight, hypertension, lipid management, diabetes, heart failure and other comorbidities.;Nutrition  handout(s) given to patient.    Expected Outcomes Short Term Goal: Understand basic principles of dietary content, such as calories, fat, sodium, cholesterol and nutrients.;Short Term Goal: A plan has been developed with personal nutrition goals set during dietitian appointment.;Long Term Goal: Adherence to prescribed nutrition plan.             Nutrition Assessments:  MEDIFICTS Score Key: ?70 Need to make dietary changes  40-70 Heart Healthy Diet ? 40 Therapeutic Level Cholesterol Diet  Flowsheet Row Pulmonary Rehab from 07/28/2022 in [facility] Cardiac and Pulmonary Rehab  Picture Your Plate Total Score on Admission 51      Picture Your Plate Scores: <16 Unhealthy dietary pattern with much room for improvement. 41-50 Dietary pattern unlikely to meet recommendations for good health and room for improvement. 51-60 More healthful dietary pattern, with some room for improvement.  >60 Healthy dietary pattern, although there may be some specific behaviors that  could be improved.   Nutrition Goals Re-Evaluation:  Nutrition Goals Re-Evaluation     Row Name 08/17/22 1602 11/16/22 1552           Goals   Current Weight 230 lb (104.3 kg) 229 lb (103.9 kg)      Nutrition Goal Lose some weight --      Comment Patient was informed on why it is important to maintain a balanced diet when dealing with Respiratory issues. Explained that it takes a lot of energy to breath and when they are short of breath often they will need to have a good diet to help keep up with the calories they are expending for breathing. Patient defers RD appointment.      Expected Outcome Short: Choose and plan snacks accordingly to patients caloric intake to improve breathing. Long: Maintain a diet independently that meets their caloric intake to aid in daily shortness of breath. --               Nutrition Goals Discharge (Final Nutrition Goals Re-Evaluation):  Nutrition Goals Re-Evaluation - 11/16/22 1552       Goals   Current Weight 229 lb (103.9 kg)    Comment Patient defers RD appointment.             Psychosocial: Target Goals: Acknowledge presence or absence of significant depression and/or stress, maximize coping skills, provide positive support system. Participant is able to verbalize types and ability to use techniques and skills needed for reducing stress and depression.   Education: Stress, Anxiety, and Depression - Group verbal and visual presentation to define topics covered.  Reviews how body is impacted by stress, anxiety, and depression.  Also discusses healthy ways to reduce stress and to treat/manage anxiety and depression.  Written material given at graduation.   Education: Sleep Hygiene -Provides group verbal and written instruction about how sleep can affect your health.  Define sleep hygiene, discuss sleep cycles and impact of sleep habits. Review good sleep hygiene tips.    Initial Review & Psychosocial Screening:  Initial Psych Review &  Screening - 07/28/22 1349       Initial Review   Current issues with Current Stress Concerns    Source of Stress Concerns Chronic Illness      Family Dynamics   Good Support System? Yes   mother     Barriers   Psychosocial barriers to participate in program There are no identifiable barriers or psychosocial needs.;The patient should benefit from training in stress management and relaxation.  Screening Interventions   Interventions To provide support and resources with identified psychosocial needs;Encouraged to exercise;Provide feedback about the scores to participant    Expected Outcomes Short Term goal: Utilizing psychosocial counselor, staff and physician to assist with identification of specific Stressors or current issues interfering with healing process. Setting desired goal for each stressor or current issue identified.;Long Term Goal: Stressors or current issues are controlled or eliminated.;Short Term goal: Identification and review with participant of any Quality of Life or Depression concerns found by scoring the questionnaire.;Long Term goal: The participant improves quality of Life and PHQ9 Scores as seen by post scores and/or verbalization of changes             Quality of Life Scores:  Scores of 19 and below usually indicate a poorer quality of life in these areas.  A difference of  2-3 points is a clinically meaningful difference.  A difference of 2-3 points in the total score of the Quality of Life Index has been associated with significant improvement in overall quality of life, self-image, physical symptoms, and general health in studies assessing change in quality of life.  PHQ-9: Review Flowsheet  More data exists      09/18/2022 08/17/2022 07/30/2022 07/15/2022 10/29/2021  Depression screen PHQ 2/9  Decreased Interest 0 0 3 0 0  Down, Depressed, Hopeless 0 0 0 0 0  PHQ - 2 Score 0 0 3 0 0  Altered sleeping - 0 3 0 -  Tired, decreased energy - 1 3 0 -  Change  in appetite - 3 2 0 -  Feeling bad or failure about yourself  - 0 0 0 -  Trouble concentrating - 0 0 0 -  Moving slowly or fidgety/restless - 0 0 0 -  Suicidal thoughts - 0 0 0 -  PHQ-9 Score - 4 11 0 -  Difficult doing work/chores - Not difficult at all Very difficult Not difficult at all -   Interpretation of Total Score  Total Score Depression Severity:  1-4 = Minimal depression, 5-9 = Mild depression, 10-14 = Moderate depression, 15-19 = Moderately severe depression, 20-27 = Severe depression   Psychosocial Evaluation and Intervention:  Psychosocial Evaluation - 07/28/22 1418       Psychosocial Evaluation & Interventions   Interventions Stress management education;Relaxation education;Encouraged to exercise with the program and follow exercise prescription    Comments Clarence [name] is coming to Pulmonary Rehab for combined heart failure. His most recent EF is 35-40%. He reports having two heart attacks in the past. 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 He is living with his mother since his heart attack. He is hesitant to exercise or get back to his normal routine because he is scared about his heart and doesn't want anything to happen to him for his mother's sake. He mentioned that if he goes out, he always makes sure he is close to a hospital in case something happens. He enjoys talking about his job, but doesn't know if he will be able to do all that is required physically. 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 He wants to come to the program to work on his strength and stamina. He also wants more education on what is going on with his heart and his prognosis.    Expected Outcomes Short: attend pulmonary rehab for education and exercise. Long: develop and maintain positive self care  habits.    Continue Psychosocial Services  Follow up required by staff              Psychosocial Re-Evaluation:  Psychosocial Re-Evaluation     Row Name 08/17/22 1604 11/16/22 1557           Psychosocial Re-Evaluation   Current issues with Current Stress Concerns None Identified      Comments Reviewed patient health questionnaire (PHQ-9) with patient for follow up. Previously, patients score indicated signs/symptoms of depression.  Reviewed to see if patient is improving symptom wise while in program.  Score improved and patient states that it is because he has been able to have more energy. Patient reports no issues with their current mental states, sleep, stress, depression or anxiety. Will follow up with patient in a few weeks for any changes.      Expected Outcomes Short: Continue to attend [facility] regularly for regular exercise and social engagement. Long: Continue to improve symptoms and manage a positive mental state. Short: Continue to exercise regularly to support mental health and notify staff of any changes. Long: maintain mental health and well being through teaching of rehab or prescribed medications independently.      Interventions -- Encouraged to attend Pulmonary Rehabilitation for the exercise      Continue Psychosocial Services  Follow up required by staff Follow up required by staff               Psychosocial Discharge (Final Psychosocial Re-Evaluation):  Psychosocial Re-Evaluation - 11/16/22 1557       Psychosocial Re-Evaluation   Current issues with None Identified    Comments Patient reports no issues with their current mental states, sleep, stress, depression or anxiety. Will follow up with patient in a few weeks for any changes.    Expected Outcomes Short: Continue to exercise regularly to support mental health and notify staff of any changes. Long: maintain mental health and well being through teaching of rehab or prescribed medications independently.    Interventions Encouraged to attend Pulmonary Rehabilitation for the exercise     Continue Psychosocial Services  Follow up required by staff             Education: Education Goals: Education classes will be provided on a weekly basis, covering required topics. Participant will state understanding/return demonstration of topics presented.  Learning Barriers/Preferences:  Learning Barriers/Preferences - 07/28/22 1335       Learning Barriers/Preferences   Learning Barriers None    Learning Preferences None             General Pulmonary Education Topics:  Infection Prevention: - Provides verbal and written material to individual with discussion of infection control including proper hand washing and proper equipment cleaning during exercise session. Flowsheet Row Pulmonary Rehab from 11/11/2022 in [facility] Cardiac and Pulmonary Rehab  Date 07/30/22  Educator NT  Instruction Review Code 1- Verbalizes Understanding       Falls Prevention: - Provides verbal and written material to individual with discussion of falls prevention and safety. Flowsheet Row Pulmonary Rehab from 11/11/2022 in [facility] Cardiac and Pulmonary Rehab  Date 07/30/22  Educator NT  Instruction Review Code 1- Verbalizes Understanding       Chronic Lung Disease Review: - Group verbal instruction with posters, models, PowerPoint presentations and videos,  to review new updates, new respiratory medications, new advancements in procedures and treatments. Providing information on websites and "800" numbers for continued self-education. Includes information about supplement oxygen, available portable oxygen systems,  continuous and intermittent flow rates, oxygen safety, concentrators, and Medicare reimbursement for oxygen. Explanation of Pulmonary Drugs, including class, frequency, complications, importance of spacers, rinsing mouth after steroid MDI's, and proper cleaning methods for nebulizers. Review of basic lung anatomy and physiology related to function, structure, and complications of lung  disease. Review of risk factors. Discussion about methods for diagnosing sleep apnea and types of masks and machines for OSA. Includes a review of the use of types of environmental controls: home humidity, furnaces, filters, dust mite/pet prevention, HEPA vacuums. Discussion about weather changes, air quality and the benefits of nasal washing. Instruction on Warning signs, infection symptoms, calling MD promptly, preventive modes, and value of vaccinations. Review of effective airway clearance, coughing and/or vibration techniques. Emphasizing that all should Create an Action Plan. Written material given at graduation.   AED/CPR: - Group verbal and written instruction with the use of models to demonstrate the basic use of the AED with the basic ABC's of resuscitation.    Anatomy and Cardiac Procedures: - Group verbal and visual presentation and models provide information about basic cardiac anatomy and function. Reviews the testing methods done to diagnose heart disease and the outcomes of the test results. Describes the treatment choices: Medical Management, Angioplasty, or Coronary Bypass Surgery for treating various heart conditions including Myocardial Infarction, Angina, Valve Disease, and Cardiac Arrhythmias.  Written material given at graduation. Flowsheet Row Pulmonary Rehab from 11/11/2022 in [facility] Cardiac and Pulmonary Rehab  Education need identified 07/30/22       Medication Safety: - Group verbal and visual instruction to review commonly prescribed medications for heart and lung disease. Reviews the medication, class of the drug, and side effects. Includes the steps to properly store meds and maintain the prescription regimen.  Written material given at graduation. Flowsheet Row Pulmonary Rehab from 11/11/2022 in [facility] Cardiac and Pulmonary Rehab  Education need identified 07/30/22       Other: -Provides group and verbal instruction on various topics (see comments) Flowsheet Row  Pulmonary Rehab from 11/11/2022 in [facility] Cardiac and Pulmonary Rehab  Date 11/11/22  Educator MS  [jeopardy]  Instruction Review Code 1- Verbalizes Understanding       Knowledge Questionnaire Score:  Knowledge Questionnaire Score - 07/28/22 1458       Knowledge Questionnaire Score   Pre Score 15/26              Core Components/Risk Factors/Patient Goals at Admission:  Personal Goals and Risk Factors at Admission - 07/30/22 1110       Core Components/Risk Factors/Patient Goals on Admission    Weight Management Yes;Weight Loss    Intervention Weight Management: Develop a combined nutrition and exercise program designed to reach desired caloric intake, while maintaining appropriate intake of nutrient and fiber, sodium and fats, and appropriate energy expenditure required for the weight goal.;Weight Management: Provide education and appropriate resources to help participant work on and attain dietary goals.;Weight Management/Obesity: Establish reasonable short term and long term weight goals.    Admit Weight 225 lb 9.6 oz (102.3 kg)    Goal Weight: Short Term 215 lb (97.5 kg)    Goal Weight: Long Term 195 lb (88.5 kg)    Expected Outcomes Weight Loss: Understanding of general recommendations for a balanced deficit meal plan, which promotes 1-2 lb weight loss per week and includes a negative energy balance of [phone] kcal/d;Short Term: Continue to assess and modify interventions until short term weight is achieved;Long Term: Adherence to nutrition and physical activity/exercise program  aimed toward attainment of established weight goal;Understanding recommendations for meals to include 15-35% energy as protein, 25-35% energy from fat, 35-60% energy from carbohydrates, less than 200mg  of dietary cholesterol, 20-35 gm of total fiber daily;Understanding of distribution of calorie intake throughout the day with the consumption of 4-5 meals/snacks    Heart Failure Yes    Intervention Provide a  combined exercise and nutrition program that is supplemented with education, support and counseling about heart failure. Directed toward relieving symptoms such as shortness of breath, decreased exercise tolerance, and extremity edema.    Expected Outcomes Improve functional capacity of life;Short term: Attendance in program 2-3 days a week with increased exercise capacity. Reported lower sodium intake. Reported increased fruit and vegetable intake. Reports medication compliance.;Long term: Adoption of self-care skills and reduction of barriers for early signs and symptoms recognition and intervention leading to self-care maintenance.;Short term: Daily weights obtained and reported for increase. Utilizing diuretic protocols set by physician.    Hypertension Yes    Intervention Provide education on lifestyle modifcations including regular physical activity/exercise, weight management, moderate sodium restriction and increased consumption of fresh fruit, vegetables, and low fat dairy, alcohol moderation, and smoking cessation.;Monitor prescription use compliance.    Expected Outcomes Long Term: Maintenance of blood pressure at goal levels.;Short Term: Continued assessment and intervention until BP is < 140/39mm HG in hypertensive participants. < 130/88mm HG in hypertensive participants with diabetes, heart failure or chronic kidney disease.    Lipids Yes    Intervention Provide education and support for participant on nutrition & aerobic/resistive exercise along with prescribed medications to achieve LDL 70mg , HDL >40mg .    Expected Outcomes Short Term: Participant states understanding of desired cholesterol values and is compliant with medications prescribed. Participant is following exercise prescription and nutrition guidelines.;Long Term: Cholesterol controlled with medications as prescribed, with individualized exercise RX and with personalized nutrition plan. Value goals: LDL < 70mg , HDL > 40 mg.              Education:Diabetes - Individual verbal and written instruction to review signs/symptoms of diabetes, desired ranges of glucose level fasting, after meals and with exercise. Acknowledge that pre and post exercise glucose checks will be done for 3 sessions at entry of program.   Know Your Numbers and Heart Failure: - Group verbal and visual instruction to discuss disease risk factors for cardiac and pulmonary disease and treatment options.  Reviews associated critical values for Overweight/Obesity, Hypertension, Cholesterol, and Diabetes.  Discusses basics of heart failure: signs/symptoms and treatments.  Introduces Heart Failure Zone chart for action plan for heart failure.  Written material given at graduation. Flowsheet Row Pulmonary Rehab from 11/11/2022 in [facility] Cardiac and Pulmonary Rehab  Education need identified 07/30/22       Core Components/Risk Factors/Patient Goals Review:   Goals and Risk Factor Review     Row Name 08/17/22 1602 11/16/22 1554           Core Components/Risk Factors/Patient Goals Review   Personal Goals Review Improve shortness of breath with ADL's Weight Management/Obesity      Review Spoke to patient about their shortness of breath and what they can do to improve. Patient has been informed of breathing techniques when starting the program. Patient is informed to tell staff if they have had any med changes and that certain meds they are taking or not taking can be causing shortness of breath. Clarence [name] wants to lose weight. His weight goal is 190 pounds. He is going to try  to lose some weight in the next few weeks. He eats alot more starches than he should. He is going to try to limit these foods.      Expected Outcomes Short: Attend [facility] regularly to improve shortness of breath with ADL's. Long: maintain independence with ADL's Short: lose a few pounds in the next few weeks. Long: reach a weight goal.               Core Components/Risk  Factors/Patient Goals at Discharge (Final Review):   Goals and Risk Factor Review - 11/16/22 1554       Core Components/Risk Factors/Patient Goals Review   Personal Goals Review Weight Management/Obesity    Review Clarence [name] wants to lose weight. His weight goal is 190 pounds. He is going to try to lose some weight in the next few weeks. He eats alot more starches than he should. He is going to try to limit these foods.    Expected Outcomes Short: lose a few pounds in the next few weeks. Long: reach a weight goal.             ITP Comments:  ITP Comments     Row Name 07/28/22 1308 07/30/22 1108 08/03/22 1539 08/05/22 0831 09/02/22 1159   ITP Comments Initial orientation completed. Diagnosis can be found in [facility] 2/28. EP Orientation scheduled for Thursday 3/14 at 9:30am. Completed and gym orientation. Initial ITP created and sent for review to Dr. Vida Rigger, Medical Director. First full day of exercise!  Patient was oriented to gym and equipment including functions, settings, policies, and procedures.  Patient's individual exercise prescription and treatment plan were reviewed.  All starting workloads were established based on the results of the 6 minute walk test done at initial orientation visit.  The plan for exercise progression was also introduced and progression will be customized based on patient's performance and goals. 30 Day review completed. Medical Director ITP review done, changes made as directed, and signed approval by Medical Director.     new to program 30 day review completed. ITP sent to Dr. Jinny Sanders, Medical Director of  Pulmonary Rehab. Continue with ITP unless changes are made by physician.    Row Name 09/21/22 1531 09/29/22 0835 09/30/22 0847 09/30/22 1154 10/06/22 1553   ITP Comments Clarence [name] stopped by to inform staff that they are wanting to do a heart cath next week because of his symptoms. He is following closely with his doctors to manage his blood pressure  concerns and his chest pain. He will come back to class after his heart cath. Since Alyus has been out on medical hold, we have not been able to assess for goals this round. 30 Day review completed. Medical Director ITP review done, changes made as directed, and signed approval by Medical Director.   out with medical concerns.  admitted to [facility] 5/14 Called patient to follow up on medical hold with pulmonary rehab. They are still adjusting some of his medications and he is due to see his cardiologist today, 5/15 for a follow up appointment with Dr. Brion Aliment. Patient will discuss with him on what they want to do and if he is appropriate to return to rehab. Patient is aware he needs to be feeling good and needs to receive clearance to return to rehab. Patient verbalized understanding. Remain patient on medical hold until further notice. Acen continues his medical hold as he has been getting evaluated for symptoms with testing including a heart cath. RN spoke with  patient yesterday who advised patient to speak with his heart failure doctor at his f/u appt on 5/29 to seek clearance to return to rehab. Per patient, he has still been experiencing symptoms and therefore we need his MD to state whether or not he is cleared to exercise.    Row Name 10/22/22 1426 10/28/22 1100 11/11/22 1122 11/24/22 1431     ITP Comments Called to check on patient.  He has been out with chest pain and heart failure work up.  He has not attended since 09/07/22.  Unable to assess for goal again 30 Day review completed. Medical Director ITP review done, changes made as directed, and signed approval by Medical Director.   out medical reason Mr. Dehaas came in to rehab today to notify staff that he was seen by Dr. Shirlee Latch today in the heart failure clinic and was cleared to restart rehab. Per ov note 11/11/22 pt may return to cardiac rehab. Pt is scheduled to restart this afternoon at 3:30 pm. 30 Day review completed. Medical Director ITP review  done, changes made as directed, and signed approval by Medical Director.             Comments:

## 2022-11-25 ENCOUNTER — Encounter: Payer: Medicaid Other | Admitting: *Deleted

## 2022-11-25 DIAGNOSIS — I5042 Chronic combined systolic (congestive) and diastolic (congestive) heart failure: Secondary | ICD-10-CM | POA: Diagnosis not present

## 2022-11-25 NOTE — Progress Notes (Signed)
Daily Session Note  Patient Details  Name: CECILIA NISHIKAWA MRN: [id number] Date of Birth: 07/24/1970 Referring Provider:   Flowsheet Row Pulmonary Rehab from 07/30/2022 in [facility] Cardiac and Pulmonary Rehab  Referring Provider Dr. Yvonne Kendall, MD       Encounter Date: 11/25/2022  Check In:  Session Check In - 11/25/22 1538       Check-In   Supervising physician immediately available to respond to emergencies See telemetry face sheet for immediately available ER MD    Location [facility]-Cardiac & Pulmonary Rehab    Staff Present Susann Givens, RN BSN;Joseph [place], RCP,RRT,BSRT;Laureen [place], [place], RRT, CPFT    Virtual Visit No    Medication changes reported     No    Fall or balance concerns reported    No    Warm-up and Cool-down Performed on first and last piece of equipment    Resistance Training Performed Yes    VAD Patient? No    PAD/SET Patient? No      Pain Assessment   Currently in Pain? No/denies                Social History   Tobacco Use  Smoking Status Former   Types: Cigars   Quit date: 08/13/2021   Years since quitting: 1.2  Smokeless Tobacco Not on file    Goals Met:  Independence with exercise equipment Exercise tolerated well No report of concerns or symptoms today Strength training completed today  Goals Unmet:  Not Applicable  Comments: Pt able to follow exercise prescription today without complaint.  Will continue to monitor for progression.    Dr. Bethann Punches is Medical Director for [facility] Cardiac Rehabilitation.  Dr. Vida Rigger is Medical Director for [facility] Pulmonary Rehabilitation.

## 2022-11-26 ENCOUNTER — Encounter: Payer: Medicaid Other | Admitting: *Deleted

## 2022-11-26 DIAGNOSIS — I5042 Chronic combined systolic (congestive) and diastolic (congestive) heart failure: Secondary | ICD-10-CM

## 2022-11-26 NOTE — Progress Notes (Signed)
Daily Session Note  Patient Details  Name: Clarence [name] MRN: [id number] Date of Birth: 1971/01/12 Referring Provider:   Flowsheet Row Pulmonary Rehab from 07/30/2022 in [facility] Cardiac and Pulmonary Rehab  Referring Provider Dr. Yvonne Kendall, MD       Encounter Date: 11/26/2022  Check In:  Session Check In - 11/26/22 1529       Check-In   Supervising physician immediately available to respond to emergencies See telemetry face sheet for immediately available ER MD    Location [facility]-Cardiac & Pulmonary Rehab    Staff Present Elige Ko, RCP,RRT,BSRT;Ellowyn Rieves Jewel Baize, RN BSN;Susanne Bice, RN, BSN, CCRP    Virtual Visit No    Medication changes reported     No    Fall or balance concerns reported    No    Warm-up and Cool-down Performed on first and last piece of equipment    Resistance Training Performed Yes    VAD Patient? No    PAD/SET Patient? No      Pain Assessment   Currently in Pain? No/denies                Social History   Tobacco Use  Smoking Status Former   Types: Cigars   Quit date: 08/13/2021   Years since quitting: 1.2  Smokeless Tobacco Not on file    Goals Met:  Independence with exercise equipment Exercise tolerated well No report of concerns or symptoms today Strength training completed today  Goals Unmet:  Not Applicable  Comments: Pt able to follow exercise prescription today without complaint.  Will continue to monitor for progression.    Dr. Bethann Punches is Medical Director for [facility] Cardiac Rehabilitation.  Dr. Vida Rigger is Medical Director for [facility] Pulmonary Rehabilitation.

## 2022-11-27 ENCOUNTER — Encounter: Payer: Medicaid Other | Admitting: Cardiology

## 2022-11-27 ENCOUNTER — Telehealth: Payer: Self-pay

## 2022-11-27 DIAGNOSIS — I5022 Chronic systolic (congestive) heart failure: Secondary | ICD-10-CM

## 2022-11-27 MED ORDER — ISOSORBIDE MONONITRATE ER 60 MG PO TB24
60.0000 mg | ORAL_TABLET | Freq: Every day | ORAL | 3 refills | Status: DC
Start: 2022-11-27 — End: 2023-02-26

## 2022-11-27 NOTE — Telephone Encounter (Signed)
Patient called to reschedule missed appointment. Patient stated at that time he has been having chest pain, tightness, and pressure with fluctuation in blood pressure. Spoke with Dr. Marca Ancona who recommended that patient increase Isosorbide mononitrate to 60mg  daily and to only take blood pressure when feeling bad. Patient also instructed that if prolonged chest pain he would need to be seen in the ER. Patient verbalized understanding and medication list updated.

## 2022-11-30 ENCOUNTER — Encounter: Payer: Medicaid Other | Admitting: *Deleted

## 2022-11-30 DIAGNOSIS — I5042 Chronic combined systolic (congestive) and diastolic (congestive) heart failure: Secondary | ICD-10-CM | POA: Diagnosis not present

## 2022-11-30 IMAGING — CT CT CHEST W/O CM
2 of 4 series · 15 of 36 positions shown, 18 images · non-contrast
Comparison: Previous studies including the chest radiographs done
on 10/20/2021 and CT done on 09/15/2021

CLINICAL DATA: Chronic dyspnea



[Series 2: thorax · axial · 0.70mm/px · z∈[-570,-288]mm · 12 of 167 slices shown, 15 images]
[im 13/167  mediastinal]
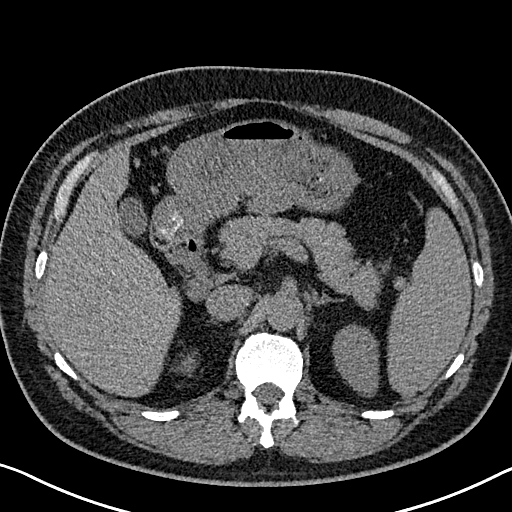
[im 13/167  lung]
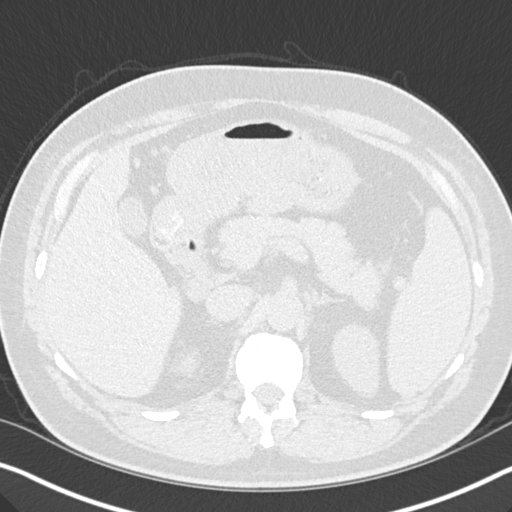
[im 26/167  lung]
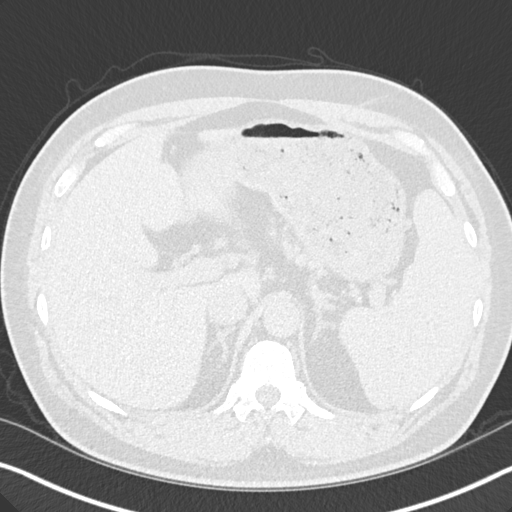
[im 39/167  lung]
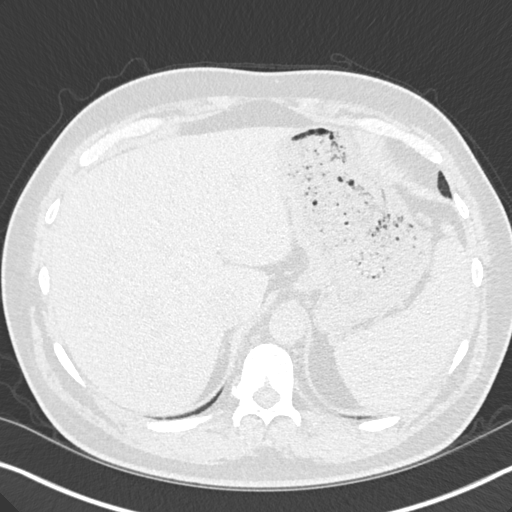
[im 52/167  lung]
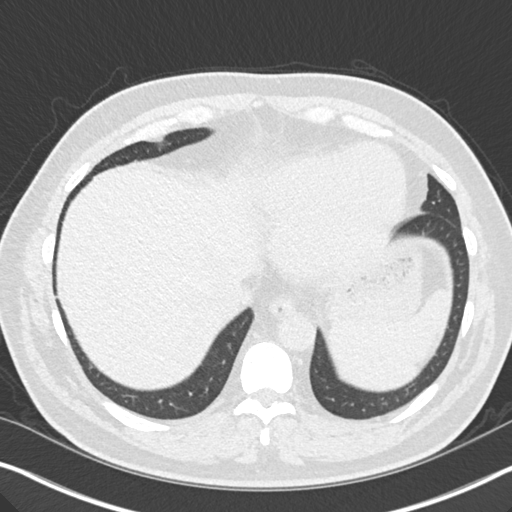
[im 64/167  mediastinal]
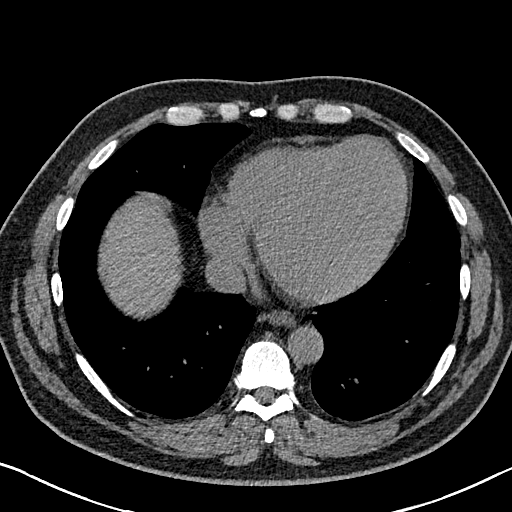
[im 64/167  lung]
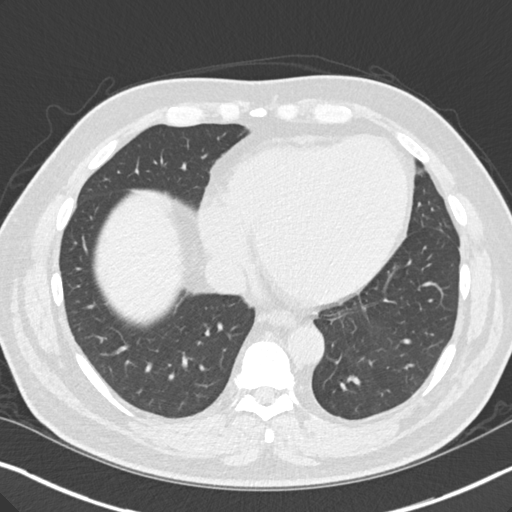
[im 77/167  lung]
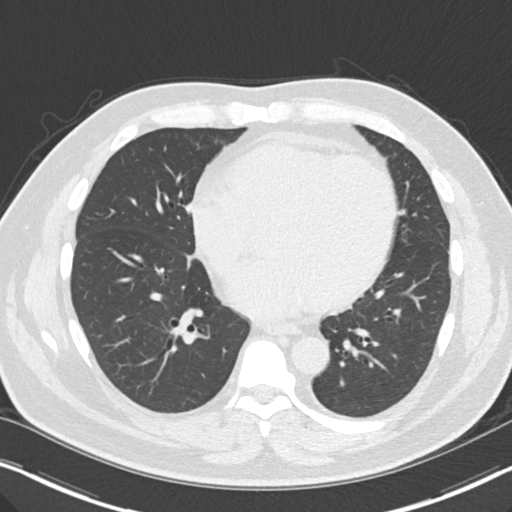
[im 90/167  lung]
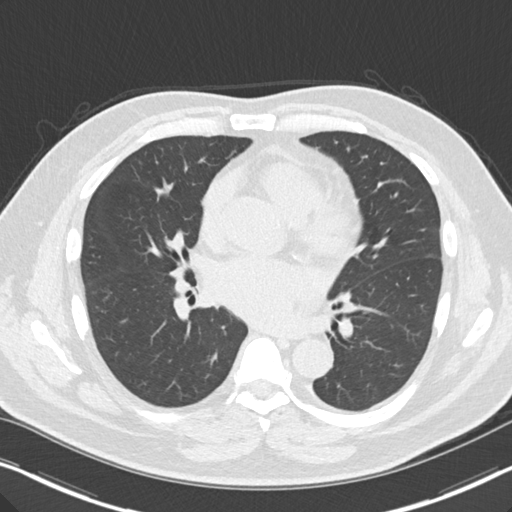
[im 103/167  lung]
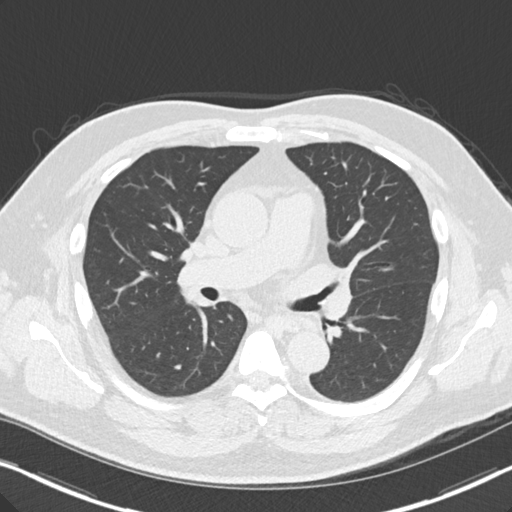
[im 115/167  mediastinal]
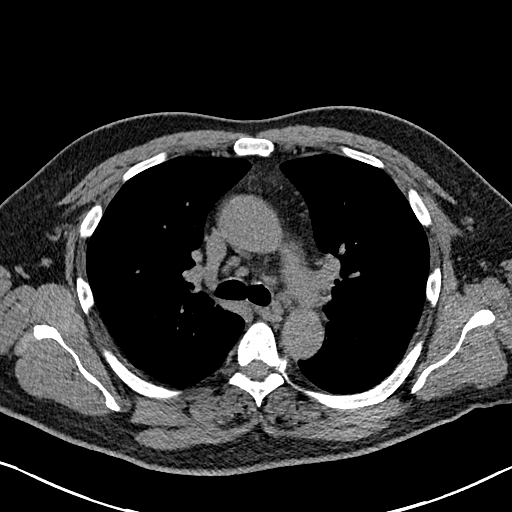
[im 115/167  lung]
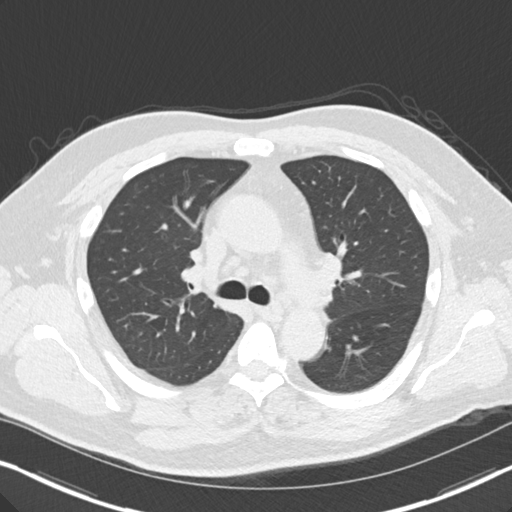
[im 128/167  lung]
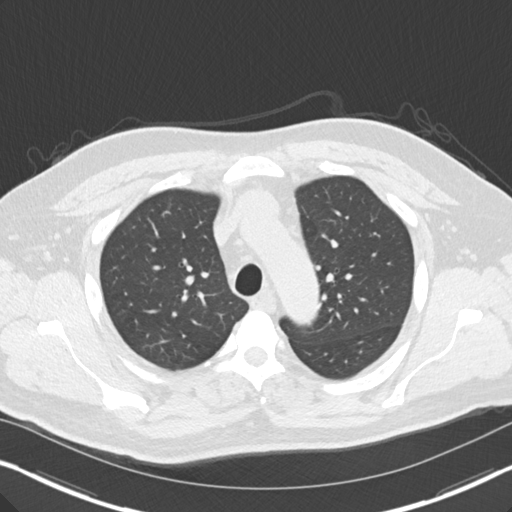
[im 141/167  lung]
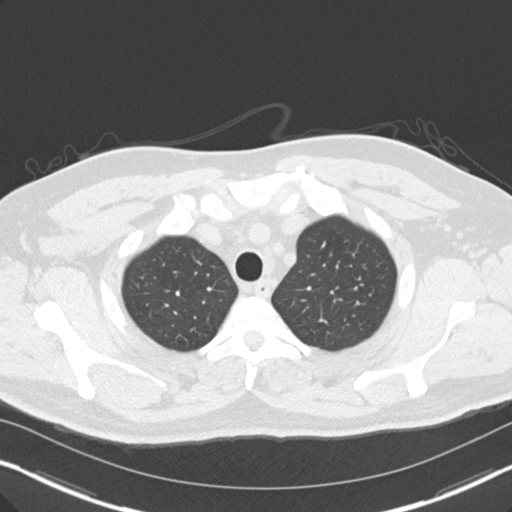
[im 154/167  lung]
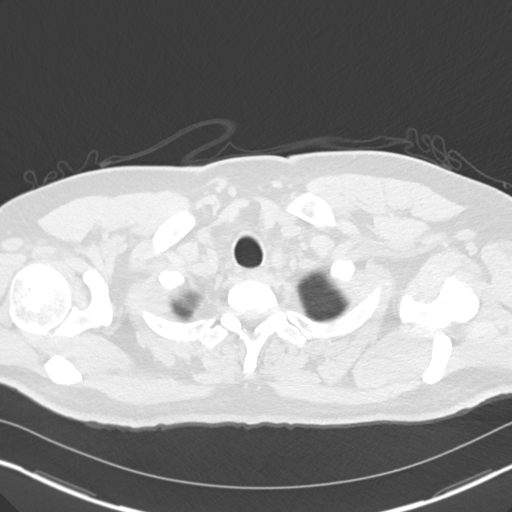

[Series 5: coronal · coronal · 0.69mm/px · 3 of 149 slices shown]
[im 30/149  lung]
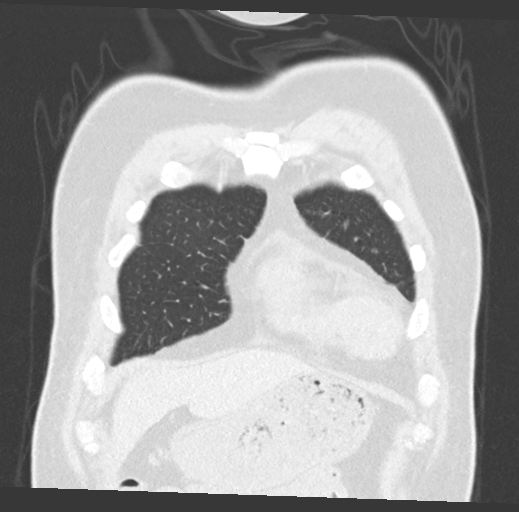
[im 60/149  lung]
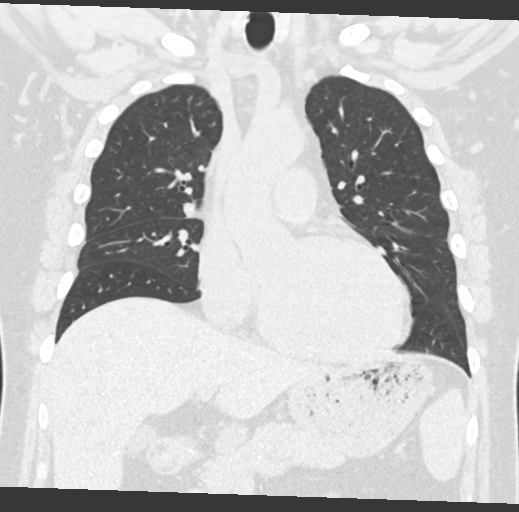
[im 89/149  lung]
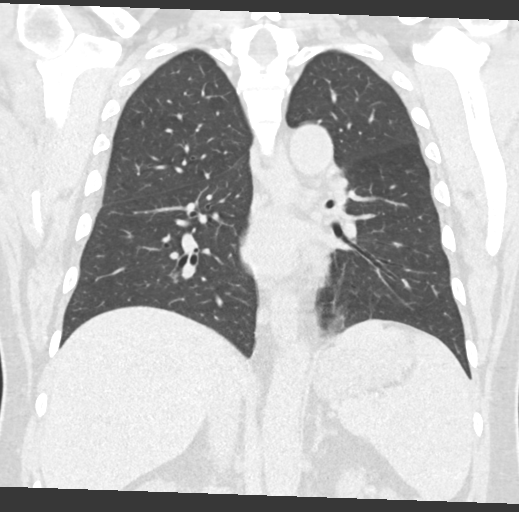

[15 of 36 positions shown; findings below may reference images not displayed]

FINDINGS: Cardiovascular: There are scattered coronary artery calcifications.
Minimal pericardial effusion is present. There is ectasia of
ascending thoracic aorta measuring 4 cm.

Mediastinum/Nodes: There are slightly enlarged lymph nodes in the
mediastinum with no significant interval change.

Lungs/Pleura: There is no focal pulmonary consolidation. In the
image 94 of series 3, there is 3 x 4 mm nodular density in the
subpleural location in the left lower lobe with no significant
interval change. In image 67 3 mm nodular density is seen in the
right upper lobe which appears stable. There are no new nodules or
new infiltrates. There is no pleural effusion or pneumothorax.

Upper Abdomen: Spleen is enlarged measuring 15.7 cm in AP diameter.

Musculoskeletal: Unremarkable.
IMPRESSION: There is no focal pulmonary consolidation. There is no evidence of
any significant interstitial lung disease. There is no pleural
effusion or pneumothorax.

Scattered coronary artery calcifications are seen. There is ectasia
of ascending thoracic aorta measuring 4 cm. Recommend annual imaging
followup by CTA or MRA. This recommendation follows 2313
ACCF/AHA/AATS/ACR/ASA/SCA/ARISSA/FAVY/BAUIT/RICKA Guidelines for the
Diagnosis and Management of Patients with Thoracic Aortic Disease.
Circulation. 2313; 121: E266-e369. Aortic aneurysm NOS (C843N-GD9.R)

There are few small nodular densities each measuring less than 4 mm
in the right upper lobe and left lower lobe. No follow-up needed if
patient is low-risk (and has no known or suspected primary
neoplasm). Non-contrast chest CT can be considered in 12 months if
patient is high-risk. This recommendation follows the consensus
statement: Guidelines for Management of Incidental Pulmonary Nodules
Detected on CT Images: From the [HOSPITAL] 4437; Radiology

Enlarged spleen.

## 2022-11-30 NOTE — Progress Notes (Signed)
Daily Session Note  Patient Details  Name: Clarence [name] MRN: [id number] Date of Birth: 1970/08/02 Referring Provider:   Flowsheet Row Pulmonary Rehab from 07/30/2022 in [facility] Cardiac and Pulmonary Rehab  Referring Provider Dr. Yvonne Kendall, MD       Encounter Date: 11/30/2022  Check In:  Session Check In - 11/30/22 1534       Check-In   Supervising physician immediately available to respond to emergencies See telemetry face sheet for immediately available ER MD    Location [facility]-Cardiac & Pulmonary Rehab    Staff Present Susann Givens, RN BSN;Laureen Manson Passey, BS, RRT, CPFT;Kelly Madilyn Fireman, BS, ACSM CEP, Exercise Physiologist    Virtual Visit No    Medication changes reported     No    Fall or balance concerns reported    No    Warm-up and Cool-down Performed on first and last piece of equipment    Resistance Training Performed Yes    VAD Patient? No    PAD/SET Patient? No      Pain Assessment   Currently in Pain? No/denies                Social History   Tobacco Use  Smoking Status Former   Types: Cigars   Quit date: 08/13/2021   Years since quitting: 1.2  Smokeless Tobacco Not on file    Goals Met:  Independence with exercise equipment Exercise tolerated well No report of concerns or symptoms today Strength training completed today  Goals Unmet:  Not Applicable  Comments: Pt able to follow exercise prescription today without complaint.  Will continue to monitor for progression.    Dr. Bethann Punches is Medical Director for [facility] Cardiac Rehabilitation.  Dr. Vida Rigger is Medical Director for [facility] Pulmonary Rehabilitation.

## 2022-12-03 ENCOUNTER — Encounter: Payer: Medicaid Other | Admitting: *Deleted

## 2022-12-03 DIAGNOSIS — I5042 Chronic combined systolic (congestive) and diastolic (congestive) heart failure: Secondary | ICD-10-CM

## 2022-12-03 NOTE — Progress Notes (Signed)
Daily Session Note  Patient Details  Name: Clarence [name] MRN: [id number] Date of Birth: 09/05/1970 Referring Provider:   Flowsheet Row Pulmonary Rehab from 07/30/2022 in [facility] Cardiac and Pulmonary Rehab  Referring Provider Dr. Yvonne Kendall, MD       Encounter Date: 12/03/2022  Check In:  Session Check In - 12/03/22 1521       Check-In   Supervising physician immediately available to respond to emergencies See telemetry face sheet for immediately available ER MD    Location [facility]-Cardiac & Pulmonary Rehab    Staff Present Susann Givens, RN BSN;Joseph [place], [place];Cora Collum, RN, BSN, CCRP    Virtual Visit No    Medication changes reported     No    Fall or balance concerns reported    No    Warm-up and Cool-down Performed on first and last piece of equipment    Resistance Training Performed Yes    VAD Patient? No    PAD/SET Patient? No      Pain Assessment   Currently in Pain? No/denies                Social History   Tobacco Use  Smoking Status Former   Types: Cigars   Quit date: 08/13/2021   Years since quitting: 1.3  Smokeless Tobacco Not on file    Goals Met:  Independence with exercise equipment Exercise tolerated well No report of concerns or symptoms today Strength training completed today  Goals Unmet:  Not Applicable  Comments: Pt able to follow exercise prescription today without complaint.  Will continue to monitor for progression.    Dr. Bethann Punches is Medical Director for [facility] Cardiac Rehabilitation.  Dr. Vida Rigger is Medical Director for [facility] Pulmonary Rehabilitation.

## 2022-12-07 ENCOUNTER — Encounter: Payer: Medicaid Other | Admitting: *Deleted

## 2022-12-07 DIAGNOSIS — I5042 Chronic combined systolic (congestive) and diastolic (congestive) heart failure: Secondary | ICD-10-CM

## 2022-12-07 NOTE — Progress Notes (Signed)
Daily Session Note  Patient Details  Name: Clarence [name] MRN: [id number] Date of Birth: November 11, 1970 Referring Provider:   Flowsheet Row Pulmonary Rehab from 07/30/2022 in [facility] Cardiac and Pulmonary Rehab  Referring Provider Dr. Yvonne Kendall, MD       Encounter Date: 12/07/2022  Check In:  Session Check In - 12/07/22 1543       Check-In   Supervising physician immediately available to respond to emergencies See telemetry face sheet for immediately available ER MD    Location [facility]-Cardiac & Pulmonary Rehab    Staff Present Susann Givens, RN BSN;Joseph [place], RCP,RRT,BSRT;Laureen [place], [place], RRT, CPFT    Virtual Visit No    Medication changes reported     No    Fall or balance concerns reported    No    Warm-up and Cool-down Performed on first and last piece of equipment    Resistance Training Performed Yes    VAD Patient? No    PAD/SET Patient? No      Pain Assessment   Currently in Pain? No/denies                Social History   Tobacco Use  Smoking Status Former   Types: Cigars   Quit date: 08/13/2021   Years since quitting: 1.3  Smokeless Tobacco Not on file    Goals Met:  Independence with exercise equipment Exercise tolerated well No report of concerns or symptoms today Strength training completed today  Goals Unmet:  Not Applicable  Comments: Pt able to follow exercise prescription today without complaint.  Will continue to monitor for progression.    Dr. Bethann Punches is Medical Director for [facility] Cardiac Rehabilitation.  Dr. Vida Rigger is Medical Director for [facility] Pulmonary Rehabilitation.

## 2022-12-09 ENCOUNTER — Encounter: Payer: Medicaid Other | Admitting: *Deleted

## 2022-12-09 DIAGNOSIS — I5042 Chronic combined systolic (congestive) and diastolic (congestive) heart failure: Secondary | ICD-10-CM | POA: Diagnosis not present

## 2022-12-09 NOTE — Progress Notes (Signed)
Daily Session Note  Patient Details  Name: Clarence [name] MRN: [id number] Date of Birth: 10-03-1970 Referring Provider:   Flowsheet Row Pulmonary Rehab from 07/30/2022 in [facility] Cardiac and Pulmonary Rehab  Referring Provider Dr. Yvonne Kendall, MD       Encounter Date: 12/09/2022  Check In:  Session Check In - 12/09/22 1528       Check-In   Supervising physician immediately available to respond to emergencies See telemetry face sheet for immediately available ER MD    Location [facility]-Cardiac & Pulmonary Rehab    Staff Present Susann Givens, RN Atilano Median, RN, ADN   Maggie Best and Max [place]   Virtual Visit No    Medication changes reported     No    Fall or balance concerns reported    No    Warm-up and Cool-down Performed on first and last piece of equipment    Resistance Training Performed Yes    VAD Patient? No    PAD/SET Patient? No      Pain Assessment   Currently in Pain? No/denies                Social History   Tobacco Use  Smoking Status Former   Types: Cigars   Quit date: 08/13/2021   Years since quitting: 1.3  Smokeless Tobacco Not on file    Goals Met:  Independence with exercise equipment Exercise tolerated well No report of concerns or symptoms today Strength training completed today  Goals Unmet:  Not Applicable  Comments: Pt able to follow exercise prescription today without complaint.  Will continue to monitor for progression.    Dr. Bethann Punches is Medical Director for [facility] Cardiac Rehabilitation.  Dr. Vida Rigger is Medical Director for [facility] Pulmonary Rehabilitation.

## 2022-12-10 ENCOUNTER — Encounter: Payer: Medicaid Other | Admitting: *Deleted

## 2022-12-10 DIAGNOSIS — I5042 Chronic combined systolic (congestive) and diastolic (congestive) heart failure: Secondary | ICD-10-CM | POA: Diagnosis not present

## 2022-12-10 NOTE — Progress Notes (Signed)
Daily Session Note  Patient Details  Name: Clarence [name] MRN: [id number] Date of Birth: [birth year] Referring Provider:   Flowsheet Row Pulmonary Rehab from 07/30/2022 in [facility] Cardiac and Pulmonary Rehab  Referring Provider Dr. Yvonne Kendall, MD       Encounter Date: 12/10/2022  Check In:  Session Check In - 12/10/22 1536       Check-In   Supervising physician immediately available to respond to emergencies See telemetry face sheet for immediately available ER MD    Location [facility]-Cardiac & Pulmonary Rehab    Staff Present Susann Givens, RN BSN;Joseph [place], RCP,RRT,BSRT;Other   Max [place] and [place] Grater Best   Virtual Visit No    Medication changes reported     No    Fall or balance concerns reported    No    Warm-up and Cool-down Performed on first and last piece of equipment    Resistance Training Performed Yes    VAD Patient? No    PAD/SET Patient? No      Pain Assessment   Currently in Pain? No/denies                Social History   Tobacco Use  Smoking Status Former   Types: Cigars   Quit date: 08/13/2021   Years since quitting: 1.3  Smokeless Tobacco Not on file    Goals Met:  Independence with exercise equipment Exercise tolerated well No report of concerns or symptoms today Strength training completed today  Goals Unmet:  Not Applicable  Comments: Pt able to follow exercise prescription today without complaint.  Will continue to monitor for progression.    Dr. Bethann Punches is Medical Director for [facility] Cardiac Rehabilitation.  Dr. Vida Rigger is Medical Director for [facility] Pulmonary Rehabilitation.

## 2022-12-14 ENCOUNTER — Encounter: Payer: Medicaid Other | Admitting: *Deleted

## 2022-12-14 DIAGNOSIS — I5042 Chronic combined systolic (congestive) and diastolic (congestive) heart failure: Secondary | ICD-10-CM | POA: Diagnosis not present

## 2022-12-14 NOTE — Progress Notes (Signed)
Daily Session Note  Patient Details  Name: Clarence [name] MRN: [id number] Date of Birth: 1970-08-13 Referring Provider:   Flowsheet Row Pulmonary Rehab from 07/30/2022 in [facility] Cardiac and Pulmonary Rehab  Referring Provider Dr. Yvonne Kendall, MD       Encounter Date: 12/14/2022  Check In:  Session Check In - 12/14/22 1528       Check-In   Supervising physician immediately available to respond to emergencies See telemetry face sheet for immediately available ER MD    Location [facility]-Cardiac & Pulmonary Rehab    Staff Present Susann Givens, RN BSN;Joseph Olivet, RCP,RRT,BSRT;Other   Max Conetta   Virtual Visit No    Medication changes reported     No    Fall or balance concerns reported    No    Warm-up and Cool-down Performed on first and last piece of equipment    Resistance Training Performed Yes    VAD Patient? No    PAD/SET Patient? No      Pain Assessment   Currently in Pain? No/denies                Social History   Tobacco Use  Smoking Status Former   Types: Cigars   Quit date: 08/13/2021   Years since quitting: 1.3  Smokeless Tobacco Not on file    Goals Met:  Independence with exercise equipment Exercise tolerated well No report of concerns or symptoms today Strength training completed today  Goals Unmet:  Not Applicable  Comments: Pt able to follow exercise prescription today without complaint.  Will continue to monitor for progression.    Dr. Bethann Punches is Medical Director for [facility] Cardiac Rehabilitation.  Dr. Vida Rigger is Medical Director for [facility] Pulmonary Rehabilitation.

## 2022-12-16 ENCOUNTER — Encounter: Payer: Medicaid Other | Admitting: *Deleted

## 2022-12-16 DIAGNOSIS — I5042 Chronic combined systolic (congestive) and diastolic (congestive) heart failure: Secondary | ICD-10-CM

## 2022-12-16 NOTE — Progress Notes (Signed)
Daily Session Note  Patient Details  Name: Clarence [name] MRN: [id number] Date of Birth: [birth year] Referring Provider:   Flowsheet Row Pulmonary Rehab from 07/30/2022 in [facility] Cardiac and Pulmonary Rehab  Referring Provider Dr. Yvonne Kendall, MD       Encounter Date: 12/16/2022  Check In:  Session Check In - 12/16/22 1531       Check-In   Supervising physician immediately available to respond to emergencies See telemetry face sheet for immediately available ER MD    Location [facility]-Cardiac & Pulmonary Rehab    Staff Present Susann Givens, RN BSN;Joseph Reino Kent, Guinevere Ferrari, RN, [place]    Virtual Visit No    Medication changes reported     No    Fall or balance concerns reported    No    Warm-up and Cool-down Performed on first and last piece of equipment    Resistance Training Performed Yes    VAD Patient? No    PAD/SET Patient? No      Pain Assessment   Currently in Pain? No/denies                Social History   Tobacco Use  Smoking Status Former   Types: Cigars   Quit date: 08/13/2021   Years since quitting: 1.3  Smokeless Tobacco Not on file    Goals Met:  Independence with exercise equipment Exercise tolerated well No report of concerns or symptoms today Strength training completed today  Goals Unmet:  Not Applicable  Comments: Pt able to follow exercise prescription today without complaint.  Will continue to monitor for progression.    Dr. Bethann Punches is Medical Director for [facility] Cardiac Rehabilitation.  Dr. Vida Rigger is Medical Director for [facility] Pulmonary Rehabilitation.

## 2022-12-17 ENCOUNTER — Encounter: Payer: Medicaid Other | Attending: Internal Medicine | Admitting: *Deleted

## 2022-12-17 VITALS — Ht 69.76 in | Wt 235.0 lb

## 2022-12-17 DIAGNOSIS — I5042 Chronic combined systolic (congestive) and diastolic (congestive) heart failure: Secondary | ICD-10-CM | POA: Diagnosis not present

## 2022-12-17 NOTE — Patient Instructions (Signed)
Discharge Patient Instructions  Patient Details  Name: Clarence [name] MRN: [id number] Date of Birth: April 27, 1971 Referring Provider:  Caesar Bookman, NP  Number of Visits: 49  Reason for Discharge:  Patient reached a stable level of exercise. Patient independent in their exercise. Patient has met program and personal goals.  Diagnosis:  Chronic combined systolic and diastolic congestive heart failure (HCC)  Initial Exercise Prescription:  Initial Exercise Prescription - 07/30/22 1400       Date of Initial Exercise RX and Referring Provider   Date 07/30/22    Referring Provider Dr. Yvonne Kendall, MD      Oxygen   Maintain Oxygen Saturation 88% or higher      Treadmill   MPH 1.8    Grade 0.5    Minutes 15    METs 2.5      Recumbant Bike   Level 1    RPM 50    Watts 25    Minutes 15    METs 2.78      NuStep   Level 2    SPM 80    Minutes 15    METs 2.78      REL-XR   Level 2    Speed 50    Minutes 15    METs 2.78      Prescription Details   Frequency (times per week) 3    Duration Progress to 30 minutes of continuous aerobic without signs/symptoms of physical distress      Intensity   THRR 40-80% of Max Heartrate 103-145    Ratings of Perceived Exertion 11-13    Perceived Dyspnea 0-4      Progression   Progression Continue to progress workloads to maintain intensity without signs/symptoms of physical distress.      Resistance Training   Training Prescription Yes    Weight 4 lb    Reps 10-15            Discharge Exercise Prescription (Final Exercise Prescription Changes):  Exercise Prescription Changes - 12/16/22 1000       Response to Exercise   Blood Pressure (Admit) 112/70    Blood Pressure (Exit) 104/58    Heart Rate (Admit) 58 bpm    Heart Rate (Exercise) 89 bpm    Heart Rate (Exit) 60 bpm    Oxygen Saturation (Admit) 96 %    Oxygen Saturation (Exercise) 94 %    Oxygen Saturation (Exit) 96 %    Rating of Perceived Exertion  (Exercise) 15    Perceived Dyspnea (Exercise) 3    Symptoms SOB    Duration Continue with 30 min of aerobic exercise without signs/symptoms of physical distress.    Intensity THRR unchanged      Progression   Progression Continue to progress workloads to maintain intensity without signs/symptoms of physical distress.    Average METs 2.3      Resistance Training   Training Prescription Yes    Weight 5 lb    Reps 10-15      Interval Training   Interval Training No      Treadmill   MPH 1.6    Grade 0    Minutes 15    METs 2.23      Recumbant Bike   Level 12    Watts 46    Minutes 15    METs 2.74      NuStep   Level 5    Minutes 30    METs 2.1  REL-XR   Level 7    Minutes 15    METs 3      Home Exercise Plan   Plans to continue exercise at Home (comment)   walking   Frequency Add 2 additional days to program exercise sessions.    Initial Home Exercises Provided 08/31/22      Oxygen   Maintain Oxygen Saturation 88% or higher            Functional Capacity:  6 Minute Walk     Row Name 07/30/22 1343 12/17/22 1552       6 Minute Walk   Phase Initial Discharge    Distance 940 feet 900 feet    Distance % Change -- -4.3 %    Distance Feet Change -- -40 ft    Walk Time 6 minutes 6 minutes    # of Rest Breaks 0 0    MPH 1.78 1.7    METS 2.78 2.38    RPE 13 15    Perceived Dyspnea  2 3    VO2 Peak 9.73 8.31    Symptoms Yes (comment) Yes (comment)    Comments SOB Leg cramps and nausea    Resting HR 61 bpm 87 bpm    Resting BP 104/62 90/60    Resting Oxygen Saturation  94 % 97 %    Exercise Oxygen Saturation  during 6 min walk 94 % 95 %    Max Ex. HR 90 bpm 97 bpm    Max Ex. BP 124/72 110/62    2 Minute Post BP 100/64 90/60      Interval HR   1 Minute HR 83 95    2 Minute HR 88 94    3 Minute HR 86 88    4 Minute HR 86 97    5 Minute HR 90 94    6 Minute HR 84 92    2 Minute Post HR 61 80    Interval Heart Rate? Yes Yes      Interval  Oxygen   Interval Oxygen? Yes Yes    Baseline Oxygen Saturation % 94 % 97 %    1 Minute Oxygen Saturation % 94 % 95 %    1 Minute Liters of Oxygen 0 L  RA 0 L    2 Minute Oxygen Saturation % -- 95 %    2 Minute Liters of Oxygen 0 L  RA 0 L    3 Minute Oxygen Saturation % 94 % 96 %    3 Minute Liters of Oxygen 0 L  RA 0 L    4 Minute Oxygen Saturation % 94 % 96 %    4 Minute Liters of Oxygen 0 L  RA 0 L    5 Minute Oxygen Saturation % 94 % 96 %    5 Minute Liters of Oxygen 0 L  RA 0 L    6 Minute Oxygen Saturation % 94 % 96 %    6 Minute Liters of Oxygen 0 L  RA 0 L    2 Minute Post Oxygen Saturation % 96 % 97 %    2 Minute Post Liters of Oxygen 0 L  RA 0 L            Nutrition & Weight - Outcomes:  Pre Biometrics - 07/30/22 1112       Pre Biometrics   Height 5' 9.75" (1.772 m)    Weight 225 lb 9.6 oz (102.3 kg)  Waist Circumference 43 inches    Hip Circumference 44 inches    Waist to Hip Ratio 0.98 %    BMI (Calculated) 32.59    Single Leg Stand 30 seconds             Post Biometrics - 12/17/22 1556        Post  Biometrics   Height 5' 9.76" (1.772 m)    Weight 235 lb (106.6 kg)    BMI (Calculated) 33.95    Single Leg Stand 17 seconds            Goals reviewed with patient; copy given to patient.

## 2022-12-17 NOTE — Progress Notes (Signed)
Daily Session Note  Patient Details  Name: Clarence [name] MRN: [id number] Date of Birth: [birth year] Referring Provider:   Flowsheet Row Pulmonary Rehab from 07/30/2022 in [facility] Cardiac and Pulmonary Rehab  Referring Provider Dr. Yvonne Kendall, MD       Encounter Date: 12/17/2022  Check In:  Session Check In - 12/17/22 1529       Check-In   Supervising physician immediately available to respond to emergencies See telemetry face sheet for immediately available ER MD    Location [facility]-Cardiac & Pulmonary Rehab    Staff Present Susann Givens, RN BSN;Joseph [place], RCP,RRT,BSRT;Other   Girtha Rm ad Jetta Lout   Virtual Visit No    Medication changes reported     No    Fall or balance concerns reported    No    Warm-up and Cool-down Performed on first and last piece of equipment    Resistance Training Performed Yes    VAD Patient? No    PAD/SET Patient? No      Pain Assessment   Currently in Pain? No/denies                Social History   Tobacco Use  Smoking Status Former   Types: Cigars   Quit date: 08/13/2021   Years since quitting: 1.3  Smokeless Tobacco Not on file    Goals Met:  Independence with exercise equipment Exercise tolerated well No report of concerns or symptoms today Strength training completed today  Goals Unmet:  Not Applicable  Comments:   6 Minute Walk     Row Name 07/30/22 1343 12/17/22 1552       6 Minute Walk   Phase Initial Discharge    Distance 940 feet 900 feet    Distance % Change -- -4.3 %    Distance Feet Change -- -40 ft    Walk Time 6 minutes 6 minutes    # of Rest Breaks 0 0    MPH 1.78 1.7    METS 2.78 2.38    RPE 13 15    Perceived Dyspnea  2 3    VO2 Peak 9.73 8.31    Symptoms Yes (comment) Yes (comment)    Comments SOB Leg cramps and nausea    Resting HR 61 bpm 87 bpm    Resting BP 104/62 90/60    Resting Oxygen Saturation  94 % 97 %    Exercise Oxygen Saturation  during 6 min walk 94 % 95 %    Max Ex.  HR 90 bpm 97 bpm    Max Ex. BP 124/72 110/62    2 Minute Post BP 100/64 90/60      Interval HR   1 Minute HR 83 95    2 Minute HR 88 94    3 Minute HR 86 88    4 Minute HR 86 97    5 Minute HR 90 94    6 Minute HR 84 92    2 Minute Post HR 61 80    Interval Heart Rate? Yes Yes      Interval Oxygen   Interval Oxygen? Yes Yes    Baseline Oxygen Saturation % 94 % 97 %    1 Minute Oxygen Saturation % 94 % 95 %    1 Minute Liters of Oxygen 0 L  RA 0 L    2 Minute Oxygen Saturation % -- 95 %    2 Minute Liters of Oxygen 0 L  RA  0 L    3 Minute Oxygen Saturation % 94 % 96 %    3 Minute Liters of Oxygen 0 L  RA 0 L    4 Minute Oxygen Saturation % 94 % 96 %    4 Minute Liters of Oxygen 0 L  RA 0 L    5 Minute Oxygen Saturation % 94 % 96 %    5 Minute Liters of Oxygen 0 L  RA 0 L    6 Minute Oxygen Saturation % 94 % 96 %    6 Minute Liters of Oxygen 0 L  RA 0 L    2 Minute Post Oxygen Saturation % 96 % 97 %    2 Minute Post Liters of Oxygen 0 L  RA 0 L            Pt able to follow exercise prescription today without complaint.  Will continue to monitor for progression.    Dr. Bethann Punches is Medical Director for [facility] Cardiac Rehabilitation.  Dr. Vida Rigger is Medical Director for [facility] Pulmonary Rehabilitation.

## 2022-12-18 ENCOUNTER — Other Ambulatory Visit: Payer: Self-pay

## 2022-12-21 ENCOUNTER — Encounter: Payer: Medicaid Other | Admitting: *Deleted

## 2022-12-21 ENCOUNTER — Ambulatory Visit: Payer: Medicaid Other | Admitting: Gastroenterology

## 2022-12-21 DIAGNOSIS — I5042 Chronic combined systolic (congestive) and diastolic (congestive) heart failure: Secondary | ICD-10-CM | POA: Diagnosis not present

## 2022-12-21 NOTE — Progress Notes (Signed)
Daily Session Note  Patient Details  Name: Clarence [name] MRN: [id number] Date of Birth: 09/10/1970 Referring Provider:   Flowsheet Row Pulmonary Rehab from 07/30/2022 in [facility] Cardiac and Pulmonary Rehab  Referring Provider Dr. Yvonne Kendall, MD       Encounter Date: 12/21/2022  Check In:  Session Check In - 12/21/22 1533       Check-In   Supervising physician immediately available to respond to emergencies See telemetry face sheet for immediately available ER MD    Location [facility]-Cardiac & Pulmonary Rehab    Staff Present Elige Ko, RCP,RRT,BSRT;Other; Jewel Baize, RN BSN   Max Conetta BS   Virtual Visit No    Medication changes reported     No    Fall or balance concerns reported    No    Warm-up and Cool-down Performed on first and last piece of equipment    Resistance Training Performed Yes    VAD Patient? No    PAD/SET Patient? No      Pain Assessment   Currently in Pain? No/denies                Social History   Tobacco Use  Smoking Status Former   Types: Cigars   Quit date: 08/13/2021   Years since quitting: 1.3  Smokeless Tobacco Not on file    Goals Met:  Independence with exercise equipment Exercise tolerated well No report of concerns or symptoms today Strength training completed today  Goals Unmet:  Not Applicable  Comments: Pt able to follow exercise prescription today without complaint.  Will continue to monitor for progression.    Dr. Bethann Punches is Medical Director for [facility] Cardiac Rehabilitation.  Dr. Vida Rigger is Medical Director for [facility] Pulmonary Rehabilitation.

## 2022-12-23 ENCOUNTER — Encounter: Payer: Medicaid Other | Admitting: *Deleted

## 2022-12-23 ENCOUNTER — Encounter: Payer: Self-pay | Admitting: *Deleted

## 2022-12-23 DIAGNOSIS — I5042 Chronic combined systolic (congestive) and diastolic (congestive) heart failure: Secondary | ICD-10-CM

## 2022-12-23 NOTE — Progress Notes (Signed)
Daily Session Note  Patient Details  Name: Clarence [name] MRN: [id number] Date of Birth: 1971/05/09 Referring Provider:   Flowsheet Row Pulmonary Rehab from 07/30/2022 in [facility] Cardiac and Pulmonary Rehab  Referring Provider Dr. Yvonne Kendall, MD       Encounter Date: 12/23/2022  Check In:  Session Check In - 12/23/22 1530       Check-In   Supervising physician immediately available to respond to emergencies See telemetry face sheet for immediately available ER MD    Location [facility]-Cardiac & Pulmonary Rehab    Staff Present Susann Givens, RN BSN;Joseph Reino Kent, Guinevere Ferrari, RN, [place]    Virtual Visit No    Medication changes reported     No    Fall or balance concerns reported    No    Warm-up and Cool-down Performed on first and last piece of equipment    Resistance Training Performed Yes    VAD Patient? No    PAD/SET Patient? No      Pain Assessment   Currently in Pain? No/denies                Social History   Tobacco Use  Smoking Status Former   Types: Cigars   Quit date: 08/13/2021   Years since quitting: 1.3  Smokeless Tobacco Not on file    Goals Met:  Independence with exercise equipment Exercise tolerated well No report of concerns or symptoms today Strength training completed today  Goals Unmet:  Not Applicable  Comments: Pt able to follow exercise prescription today without complaint.  Will continue to monitor for progression.    Dr. Bethann Punches is Medical Director for [facility] Cardiac Rehabilitation.  Dr. Vida Rigger is Medical Director for [facility] Pulmonary Rehabilitation.

## 2022-12-23 NOTE — Progress Notes (Signed)
Pulmonary Individual Treatment Plan  Patient Details  Name: Clarence [name] MRN: [id number] Date of Birth: 1971-04-11 Referring Provider:   Flowsheet Row Pulmonary Rehab from 07/30/2022 in [facility] Cardiac and Pulmonary Rehab  Referring Provider Dr. Yvonne Kendall, MD       Initial Encounter Date:  Flowsheet Row Pulmonary Rehab from 07/30/2022 in [facility] Cardiac and Pulmonary Rehab  Date 07/30/22       Visit Diagnosis: Chronic combined systolic and diastolic congestive heart failure (HCC)  Patient's Home Medications on Admission:  Current Outpatient Medications:    acetaminophen (TYLENOL) 325 MG tablet, Take 650 mg by mouth at bedtime., Disp: , Rfl:    aspirin EC 81 MG tablet, Take 1 tablet (81 mg total) by mouth daily. Swallow whole., Disp: 30 tablet, Rfl: 4   atorvastatin (LIPITOR) 80 MG tablet, Take 1 tablet (80 mg total) by mouth every evening., Disp: 90 tablet, Rfl: 3   carvedilol (COREG) 6.25 MG tablet, Take 6.25 mg by mouth 2 (two) times daily with a meal., Disp: , Rfl:    clopidogrel (PLAVIX) 75 MG tablet, Take 1 tablet (75 mg total) by mouth daily., Disp: 30 tablet, Rfl: 3   empagliflozin (JARDIANCE) 10 MG TABS tablet, Take 10 mg by mouth daily., Disp: , Rfl:    ezetimibe (ZETIA) 10 MG tablet, Take 1 tablet (10 mg total) by mouth daily., Disp: 90 tablet, Rfl: 3   famotidine (PEPCID) 10 MG tablet, Take 10 mg by mouth at bedtime. As needed, Disp: , Rfl:    fenofibrate (TRICOR) 48 MG tablet, Take 1 tablet (48 mg total) by mouth daily., Disp: 30 tablet, Rfl: 8   furosemide (LASIX) 20 MG tablet, Take 1 tablet (20 mg total) by mouth daily as needed. For weight gain of 3 lb in 24 hours or 5 lbs in one week, Disp: 90 tablet, Rfl: 1   isosorbide mononitrate (IMDUR) 60 MG 24 hr tablet, Take 1 tablet (60 mg total) by mouth daily., Disp: 90 tablet, Rfl: 3   nitroGLYCERIN (NITROSTAT) 0.4 MG SL tablet, Place 1 tablet (0.4 mg total) under the tongue every 5 (five) minutes as needed for chest  pain., Disp: 25 tablet, Rfl: 3   ondansetron (ZOFRAN) 4 MG tablet, Take 1 tablet (4 mg total) by mouth every 8 (eight) hours as needed for nausea or vomiting., Disp: 20 tablet, Rfl: 0   pantoprazole (PROTONIX) 40 MG tablet, Take 1 tablet (40 mg total) by mouth daily., Disp: 30 tablet, Rfl: 11   ranolazine (RANEXA) 500 MG 12 hr tablet, Take 500 mg by mouth 2 (two) times daily., Disp: , Rfl:    sacubitril-valsartan (ENTRESTO) 24-26 MG, Take 1 tablet by mouth 2 (two) times daily., Disp: 180 tablet, Rfl: 1   spironolactone (ALDACTONE) 25 MG tablet, Take 0.5 tablets (12.5 mg total) by mouth daily., Disp: 45 tablet, Rfl: 1  Current Facility-Administered Medications:    sodium chloride flush (NS) 0.9 % injection 3 mL, 3 mL, Intravenous, Q12H, Laurey Morale, MD  Past Medical History: Past Medical History:  Diagnosis Date   Ascending Aortic Dilation    a. 09/2021 Echo: Asc Ao 40mm.   CAD (coronary artery disease)    a. 07/2021 Cath: LM nl, LAD 53m, D2 40, LCX 65p/m, OM1 90, RCA 149m w/ L-.R collats to RPDA from septal 1/2-->Med rx.   Chronic HFrEF (heart failure with reduced ejection fraction) (HCC)    a. 07/2021 Echo: EF 30-35%, glob HK, mod LVH, GrII DD, nl RV fxn, mildly dil  LA, mild-mod MR; b. 09/2021 Echo: EF 35-40%, glob HK, mod LVH, GrI DD, nl RV fxn, mildly dil RA, mild MR, Asc Ao 40mm.   CKD (chronic kidney disease) stage 2, GFR 60-89 ml/min    Crohn's disease (HCC)    Hypertension    Mixed Ischemic & Nonischemic cardiomyopathy    a. 07/2021 Echo: EF 30-35%; b. 07/2021 Cath: Occluded RCA w/ mod LAD/LCX dzs, and severe OM1 dzs-->Med Rx; c. 09/2021 Echo: EF 35-40%.   PAH (pulmonary artery hypertension) (HCC)    a. 07/2021 RHC: PA 67/30 (42).   Proteinuria     Tobacco Use: Social History   Tobacco Use  Smoking Status Former   Types: Cigars   Quit date: 08/13/2021   Years since quitting: 1.3  Smokeless Tobacco Not on file    Labs: Review Flowsheet  More data exists      Latest Ref  Rng & Units 10/07/2021 01/27/2022 07/15/2022 09/21/2022 09/29/2022  Labs for ITP Cardiac and Pulmonary Rehab  Cholestrol 0 - 200 mg/dL 161  096  045  409  -  LDL (calc) 0 - 99 mg/dL 43  70  81  59  -  HDL-C >40 mg/dL 30  35  39  33  -  Trlycerides <150 mg/dL 811  914  782  956  -  Bicarbonate 20.0 - 28.0 mmol/L - - - - 22.9  23.5   TCO2 22 - 32 mmol/L - - - - 24  25   Acid-base deficit 0.0 - 2.0 mmol/L - - - - 3.0  3.0   O2 Saturation % - - - - 71  68     Details       Multiple values from one day are sorted in reverse-chronological order          Pulmonary Assessment Scores:  Pulmonary Assessment Scores     Row Name 07/28/22 1459 07/30/22 1109 12/17/22 1741     ADL UCSD   ADL Phase Entry Entry Exit   SOB Score total 54 54 --   Rest 5 5 --   Walk 3 3 --   Stairs 3 3 --   Bath 3 3 --   Dress 2 2 --   Shop 3 3 --     CAT Score   CAT Score 22 22 --     mMRC Score   mMRC Score -- 2 3            UCSD: Self-administered rating of dyspnea associated with activities of daily living (ADLs) 6-point scale (0 = "not at all" to 5 = "maximal or unable to do because of breathlessness")  Scoring Scores range from 0 to 120.  Minimally important difference is 5 units  CAT: CAT can identify the health impairment of COPD patients and is better correlated with disease progression.  CAT has a scoring range of zero to 40. The CAT score is classified into four groups of low (less than 10), medium (10 - 20), high (21-30) and very high (31-40) based on the impact level of disease on health status. A CAT score over 10 suggests significant symptoms.  A worsening CAT score could be explained by an exacerbation, poor medication adherence, poor inhaler technique, or progression of COPD or comorbid conditions.  CAT MCID is 2 points  mMRC: mMRC (Modified Medical Research Council) Dyspnea Scale is used to assess the degree of baseline functional disability in patients of respiratory disease due to  dyspnea. No minimal important  difference is established. A decrease in score of 1 point or greater is considered a positive change.   Pulmonary Function Assessment:   Exercise Target Goals: Exercise Program Goal: Individual exercise prescription set using results from initial 6 min walk test and THRR while considering  patient's activity barriers and safety.   Exercise Prescription Goal: Initial exercise prescription builds to 30-45 minutes a day of aerobic activity, 2-3 days per week.  Home exercise guidelines will be given to patient during program as part of exercise prescription that the participant will acknowledge.  Education: Aerobic Exercise: - Group verbal and visual presentation on the components of exercise prescription. Introduces F.I.T.T principle from ACSM for exercise prescriptions.  Reviews F.I.T.T. principles of aerobic exercise including progression. Written material given at graduation. Flowsheet Row Pulmonary Rehab from 11/11/2022 in [facility] Cardiac and Pulmonary Rehab  Education need identified 07/30/22  Date 08/12/22  Educator KW  Instruction Review Code 1- [employer] Understanding       Education: Resistance Exercise: - Group verbal and visual presentation on the components of exercise prescription. Introduces F.I.T.T principle from ACSM for exercise prescriptions  Reviews F.I.T.T. principles of resistance exercise including progression. Written material given at graduation.    Education: Exercise & Equipment Safety: - Individual verbal instruction and demonstration of equipment use and safety with use of the equipment. Flowsheet Row Pulmonary Rehab from 11/11/2022 in [facility] Cardiac and Pulmonary Rehab  Date 07/30/22  Educator NT  Instruction Review Code 1- Verbalizes Understanding       Education: Exercise Physiology & General Exercise Guidelines: - Group verbal and written instruction with models to review the exercise physiology of the cardiovascular system  and associated critical values. Provides general exercise guidelines with specific guidelines to those with heart or lung disease.  Flowsheet Row Pulmonary Rehab from 11/11/2022 in [facility] Cardiac and Pulmonary Rehab  Date 08/05/22  Educator KW  Instruction Review Code 1- [employer] Understanding       Education: Flexibility, Balance, Mind/Body Relaxation: - Group verbal and visual presentation with interactive activity on the components of exercise prescription. Introduces F.I.T.T principle from ACSM for exercise prescriptions. Reviews F.I.T.T. principles of flexibility and balance exercise training including progression. Also discusses the mind body connection.  Reviews various relaxation techniques to help reduce and manage stress (i.e. Deep breathing, progressive muscle relaxation, and visualization). Balance handout provided to take home. Written material given at graduation.   Activity Barriers & Risk Stratification:  Activity Barriers & Cardiac Risk Stratification - 07/30/22 1111       Activity Barriers & Cardiac Risk Stratification   Activity Barriers Joint Problems;Deconditioning;Shortness of Breath   left shoulder pain            6 Minute Walk:  6 Minute Walk     Row Name 07/30/22 1343 12/17/22 1552       6 Minute Walk   Phase Initial Discharge    Distance 940 feet 900 feet    Distance % Change -- -4.3 %    Distance Feet Change -- -40 ft    Walk Time 6 minutes 6 minutes    # of Rest Breaks 0 0    MPH 1.78 1.7    METS 2.78 2.38    RPE 13 15    Perceived Dyspnea  2 3    VO2 Peak 9.73 8.31    Symptoms Yes (comment) Yes (comment)    Comments SOB Leg cramps and nausea    Resting HR 61 bpm 87 bpm  Resting BP 104/62 90/60    Resting Oxygen Saturation  94 % 97 %    Exercise Oxygen Saturation  during 6 min walk 94 % 95 %    Max Ex. HR 90 bpm 97 bpm    Max Ex. BP 124/72 110/62    2 Minute Post BP 100/64 90/60      Interval HR   1 Minute HR 83 95    2 Minute HR 88  94    3 Minute HR 86 88    4 Minute HR 86 97    5 Minute HR 90 94    6 Minute HR 84 92    2 Minute Post HR 61 80    Interval Heart Rate? Yes Yes      Interval Oxygen   Interval Oxygen? Yes Yes    Baseline Oxygen Saturation % 94 % 97 %    1 Minute Oxygen Saturation % 94 % 95 %    1 Minute Liters of Oxygen 0 L  RA 0 L    2 Minute Oxygen Saturation % -- 95 %    2 Minute Liters of Oxygen 0 L  RA 0 L    3 Minute Oxygen Saturation % 94 % 96 %    3 Minute Liters of Oxygen 0 L  RA 0 L    4 Minute Oxygen Saturation % 94 % 96 %    4 Minute Liters of Oxygen 0 L  RA 0 L    5 Minute Oxygen Saturation % 94 % 96 %    5 Minute Liters of Oxygen 0 L  RA 0 L    6 Minute Oxygen Saturation % 94 % 96 %    6 Minute Liters of Oxygen 0 L  RA 0 L    2 Minute Post Oxygen Saturation % 96 % 97 %    2 Minute Post Liters of Oxygen 0 L  RA 0 L            Oxygen Initial Assessment:  Oxygen Initial Assessment - 07/28/22 1416       Home Oxygen   Home Oxygen Device None    Sleep Oxygen Prescription None    Home Exercise Oxygen Prescription None    Home Resting Oxygen Prescription None    Compliance with Home Oxygen Use Yes      Intervention   Short Term Goals To learn and understand importance of maintaining oxygen saturations>88%;To learn and demonstrate proper pursed lip breathing techniques or other breathing techniques. ;To learn and understand importance of monitoring SPO2 with pulse oximeter and demonstrate accurate use of the pulse oximeter.    Long  Term Goals Maintenance of O2 saturations>88%;Compliance with respiratory medication;Verbalizes importance of monitoring SPO2 with pulse oximeter and return demonstration;Exhibits proper breathing techniques, such as pursed lip breathing or other method taught during program session             Oxygen Re-Evaluation:  Oxygen Re-Evaluation     Row Name 08/03/22 1540 08/17/22 1559 11/16/22 1550 11/30/22 1557       Program Oxygen Prescription    Program Oxygen Prescription -- None None None      Home Oxygen   Home Oxygen Device -- None None None    Sleep Oxygen Prescription -- None None None    Home Exercise Oxygen Prescription -- None None None    Home Resting Oxygen Prescription -- None None None    Compliance with Home Oxygen Use -- -- --  Yes      Goals/Expected Outcomes   Short Term Goals -- To learn and demonstrate proper pursed lip breathing techniques or other breathing techniques.  To learn and understand importance of maintaining oxygen saturations>88%;To learn and understand importance of monitoring SPO2 with pulse oximeter and demonstrate accurate use of the pulse oximeter. To learn and understand importance of maintaining oxygen saturations>88%;To learn and understand importance of monitoring SPO2 with pulse oximeter and demonstrate accurate use of the pulse oximeter.    Long  Term Goals -- Exhibits proper breathing techniques, such as pursed lip breathing or other method taught during program session Verbalizes importance of monitoring SPO2 with pulse oximeter and return demonstration;Maintenance of O2 saturations>88% Verbalizes importance of monitoring SPO2 with pulse oximeter and return demonstration;Maintenance of O2 saturations>88%    Comments Reviewed PLB technique with pt.  Talked about how it works and it's importance in maintaining their exercise saturations. Informed patient how to perform the Pursed Lipped breathing technique. Told patient to Inhale through the nose and out the mouth with pursed lips to keep their airways open, help oxygenate them better, practice when at rest or doing strenuous activity. Patient Verbalizes understanding of technique and will work on and be reiterated during [facility]. He does not have a pulse oximeter to check her/his oxygen saturation at home. Informed him where to get one and explained why it is important to have one. Reviewed that oxygen saturations should be 88 percent and above.  Patient verbalizes understanding. He does not have a pulse oximeter to check his oxygen saturation at home. Informed him where to get one and explained why it is important to have one. Reviewed that oxygen saturations should be 88 percent and above. Patient verbalizes understanding. His current pulse oximeter did not work right and is going to go to [employer] to get one. He is going to bring it in after he purchases one.    Goals/Expected Outcomes Short: Become more profiecient at using PLB.   Long: Become independent at using PLB. Short: use PLB with exertion. Long: use PLB on exertion proficiently and independently. Short: monitor oxygen at home with exertion. Long: maintain oxygen saturations above 88 percent independently. Short: obtain a pulse oximeter and monitor oxygen at home with exertion. Long: maintain oxygen saturations above 88 percent independently.             Oxygen Discharge (Final Oxygen Re-Evaluation):  Oxygen Re-Evaluation - 11/30/22 1557       Program Oxygen Prescription   Program Oxygen Prescription None      Home Oxygen   Home Oxygen Device None    Sleep Oxygen Prescription None    Home Exercise Oxygen Prescription None    Home Resting Oxygen Prescription None    Compliance with Home Oxygen Use Yes      Goals/Expected Outcomes   Short Term Goals To learn and understand importance of maintaining oxygen saturations>88%;To learn and understand importance of monitoring SPO2 with pulse oximeter and demonstrate accurate use of the pulse oximeter.    Long  Term Goals Verbalizes importance of monitoring SPO2 with pulse oximeter and return demonstration;Maintenance of O2 saturations>88%    Comments He does not have a pulse oximeter to check his oxygen saturation at home. Informed him where to get one and explained why it is important to have one. Reviewed that oxygen saturations should be 88 percent and above. Patient verbalizes understanding. His current pulse oximeter did not  work right and is going to go to [employer] to  get one. He is going to bring it in after he purchases one.    Goals/Expected Outcomes Short: obtain a pulse oximeter and monitor oxygen at home with exertion. Long: maintain oxygen saturations above 88 percent independently.             Initial Exercise Prescription:  Initial Exercise Prescription - 07/30/22 1400       Date of Initial Exercise RX and Referring Provider   Date 07/30/22    Referring Provider Dr. Yvonne Kendall, MD      Oxygen   Maintain Oxygen Saturation 88% or higher      Treadmill   MPH 1.8    Grade 0.5    Minutes 15    METs 2.5      Recumbant Bike   Level 1    RPM 50    Watts 25    Minutes 15    METs 2.78      NuStep   Level 2    SPM 80    Minutes 15    METs 2.78      REL-XR   Level 2    Speed 50    Minutes 15    METs 2.78      Prescription Details   Frequency (times per week) 3    Duration Progress to 30 minutes of continuous aerobic without signs/symptoms of physical distress      Intensity   THRR 40-80% of Max Heartrate 103-145    Ratings of Perceived Exertion 11-13    Perceived Dyspnea 0-4      Progression   Progression Continue to progress workloads to maintain intensity without signs/symptoms of physical distress.      Resistance Training   Training Prescription Yes    Weight 4 lb    Reps 10-15             Perform Capillary Blood Glucose checks as needed.  Exercise Prescription Changes:   Exercise Prescription Changes     Row Name 07/30/22 1400 08/12/22 1400 08/27/22 1400 09/01/22 0800 09/09/22 1300     Response to Exercise   Blood Pressure (Admit) 104/62 102/60 110/68 -- 110/60   Blood Pressure (Exercise) 124/72 122/68 148/78 -- --   Blood Pressure (Exit) 100/64 98/60 124/78 -- 92/64   Heart Rate (Admit) 61 bpm 78 bpm 63 bpm -- 69 bpm   Heart Rate (Exercise) 90 bpm 102 bpm 94 bpm -- 86 bpm   Heart Rate (Exit) 61 bpm 83 bpm 76 bpm -- 73 bpm   Oxygen Saturation  (Admit) 94 % 97 % 97 % -- 97 %   Oxygen Saturation (Exercise) 94 % 95 % 93 % -- 93 %   Oxygen Saturation (Exit) 96 % 95 % 95 % -- 94 %   Rating of Perceived Exertion (Exercise) 13 15 15  -- 15   Perceived Dyspnea (Exercise) 2 2 3  -- 2   Symptoms SOB SOB SOB -- SOB   Comments Results 3rd full day of exercise -- -- --   Duration -- Progress to 30 minutes of  aerobic without signs/symptoms of physical distress Continue with 30 min of aerobic exercise without signs/symptoms of physical distress. -- Continue with 30 min of aerobic exercise without signs/symptoms of physical distress.   Intensity -- THRR unchanged THRR unchanged -- THRR unchanged     Progression   Progression -- Continue to progress workloads to maintain intensity without signs/symptoms of physical distress. Continue to progress workloads to maintain intensity without signs/symptoms  of physical distress. -- Continue to progress workloads to maintain intensity without signs/symptoms of physical distress.   Average METs -- 2.6 2.76 -- 2.33     Resistance Training   Training Prescription -- Yes Yes -- Yes   Weight -- 4 lb 4 lb -- 5 lb   Reps -- 10-15 10-15 -- 10-15     Interval Training   Interval Training -- No No -- No     Treadmill   MPH -- 1.8 1.9 -- 1.9   Grade -- 2 1.5 -- 0   Minutes -- 15 15 -- 15   METs -- 2.87 2.85 -- 2.45     NuStep   Level -- 2 2 -- --   Minutes -- 15 15 -- --   METs -- 2.5 2.3 -- --     Recumbant Elliptical   Level -- -- 1 -- --   Minutes -- -- 15 -- --   METs -- -- 1.4 -- --     Elliptical   Level -- -- -- -- 1   Minutes -- -- -- -- 15     REL-XR   Level -- 2 3 -- 4   Minutes -- 15 15 -- 15   METs -- 2.6 3.7 -- 2.6     T5 Nustep   Level -- 1 4 -- --   Minutes -- 15 15 -- --   METs -- 2.3 2.2 -- --     Home Exercise Plan   Plans to continue exercise at -- -- -- Home (comment)  walking Home (comment)  walking   Frequency -- -- -- Add 2 additional days to program exercise  sessions. Add 2 additional days to program exercise sessions.   Initial Home Exercises Provided -- -- -- 08/31/22 08/31/22     Oxygen   Maintain Oxygen Saturation -- 88% or higher 88% or higher -- 88% or higher    Row Name 09/22/22 1500 11/18/22 0800 12/04/22 0800 12/16/22 1000       Response to Exercise   Blood Pressure (Admit) 82/58 100/60 102/60 112/70    Blood Pressure (Exit) 84/60 106/60 104/62 104/58    Heart Rate (Admit) 64 bpm 82 bpm 99 bpm 58 bpm    Heart Rate (Exercise) 76 bpm 85 bpm 104 bpm 89 bpm    Heart Rate (Exit) 67 bpm 68 bpm 99 bpm 60 bpm    Oxygen Saturation (Admit) 96 % 92 % 97 % 96 %    Oxygen Saturation (Exercise) 95 % 92 % 93 % 94 %    Oxygen Saturation (Exit) 96 % 96 % 96 % 96 %    Rating of Perceived Exertion (Exercise) 12 15 16 15     Perceived Dyspnea (Exercise) -- 3 2 3     Symptoms SOB SOB SOB SOB    Duration Continue with 30 min of aerobic exercise without signs/symptoms of physical distress. Continue with 30 min of aerobic exercise without signs/symptoms of physical distress. Continue with 30 min of aerobic exercise without signs/symptoms of physical distress. Continue with 30 min of aerobic exercise without signs/symptoms of physical distress.    Intensity THRR unchanged THRR unchanged THRR unchanged THRR unchanged      Progression   Progression Continue to progress workloads to maintain intensity without signs/symptoms of physical distress. Continue to progress workloads to maintain intensity without signs/symptoms of physical distress. Continue to progress workloads to maintain intensity without signs/symptoms of physical distress. Continue to progress workloads to maintain  intensity without signs/symptoms of physical distress.    Average METs 1.8 1.87 2.3 2.3      Resistance Training   Training Prescription Yes Yes Yes Yes    Weight 5 lb 5 lb 5 lb 5 lb    Reps 10-15 10-15 10-15 10-15      Interval Training   Interval Training No No No No       Treadmill   MPH -- -- 1.9 1.6    Grade -- -- 0 0    Minutes -- -- 15 15    METs -- -- 2.45 2.23      Recumbant Bike   Level -- 7 9 12     Watts -- 34 30 46    Minutes -- 15 15 15     METs -- -- 2.75 2.74      NuStep   Level -- 6 6 5     Minutes -- 30 15 30     METs -- 2 2.1 2.1      REL-XR   Level -- -- 6 7    Minutes -- -- 15 15    METs -- -- 2.7 3      T5 Nustep   Level 4 -- 4 --    Minutes 30 -- 15 --    METs 1.8 -- 1.8 --      Home Exercise Plan   Plans to continue exercise at Home (comment)  walking Home (comment)  walking Home (comment)  walking Home (comment)  walking    Frequency Add 2 additional days to program exercise sessions. Add 2 additional days to program exercise sessions. Add 2 additional days to program exercise sessions. Add 2 additional days to program exercise sessions.    Initial Home Exercises Provided 08/31/22 08/31/22 08/31/22 08/31/22      Oxygen   Maintain Oxygen Saturation 88% or higher 88% or higher 88% or higher 88% or higher             Exercise Comments:   Exercise Comments     Row Name 08/03/22 1539           Exercise Comments First full day of exercise!  Patient was oriented to gym and equipment including functions, settings, policies, and procedures.  Patient's individual exercise prescription and treatment plan were reviewed.  All starting workloads were established based on the results of the 6 minute walk test done at initial orientation visit.  The plan for exercise progression was also introduced and progression will be customized based on patient's performance and goals.                Exercise Goals and Review:   Exercise Goals     Row Name 07/30/22 1112             Exercise Goals   Increase Physical Activity Yes       Intervention Provide advice, education, support and counseling about physical activity/exercise needs.;Develop an individualized exercise prescription for aerobic and resistive training based  on initial evaluation findings, risk stratification, comorbidities and participant's personal goals.       Expected Outcomes Short Term: Attend rehab on a regular basis to increase amount of physical activity.;Long Term: Add in home exercise to make exercise part of routine and to increase amount of physical activity.;Long Term: Exercising regularly at least 3-5 days a week.       Increase Strength and Stamina Yes       Intervention Provide advice, education, support and  counseling about physical activity/exercise needs.;Develop an individualized exercise prescription for aerobic and resistive training based on initial evaluation findings, risk stratification, comorbidities and participant's personal goals.       Expected Outcomes Short Term: Increase workloads from initial exercise prescription for resistance, speed, and METs.;Long Term: Improve cardiorespiratory fitness, muscular endurance and strength as measured by increased METs and functional capacity ( );Short Term: Perform resistance training exercises routinely during rehab and add in resistance training at home       Able to understand and use rate of perceived exertion (RPE) scale Yes       Intervention Provide education and explanation on how to use RPE scale       Expected Outcomes Short Term: Able to use RPE daily in rehab to express subjective intensity level;Long Term:  Able to use RPE to guide intensity level when exercising independently       Able to understand and use Dyspnea scale Yes       Intervention Provide education and explanation on how to use Dyspnea scale       Expected Outcomes Short Term: Able to use Dyspnea scale daily in rehab to express subjective sense of shortness of breath during exertion;Long Term: Able to use Dyspnea scale to guide intensity level when exercising independently       Knowledge and understanding of Target Heart Rate Range (THRR) Yes       Intervention Provide education and explanation of THRR  including how the numbers were predicted and where they are located for reference       Expected Outcomes Short Term: Able to state/look up THRR;Long Term: Able to use THRR to govern intensity when exercising independently;Short Term: Able to use daily as guideline for intensity in rehab       Able to check pulse independently Yes       Intervention Provide education and demonstration on how to check pulse in carotid and radial arteries.;Review the importance of being able to check your own pulse for safety during independent exercise       Expected Outcomes Short Term: Able to explain why pulse checking is important during independent exercise;Long Term: Able to check pulse independently and accurately       Understanding of Exercise Prescription Yes       Intervention Provide education, explanation, and written materials on patient's individual exercise prescription       Expected Outcomes Short Term: Able to explain program exercise prescription;Long Term: Able to explain home exercise prescription to exercise independently                Exercise Goals Re-Evaluation :  Exercise Goals Re-Evaluation     Row Name 08/03/22 1539 08/12/22 1426 08/27/22 1446 09/01/22 0822 09/09/22 1359     Exercise Goal Re-Evaluation   Exercise Goals Review Increase Physical Activity;Able to understand and use rate of perceived exertion (RPE) scale;Knowledge and understanding of Target Heart Rate Range (THRR);Understanding of Exercise Prescription;Increase Strength and Stamina;Able to check pulse independently;Able to understand and use Dyspnea scale Increase Physical Activity;Increase Strength and Stamina;Understanding of Exercise Prescription Increase Physical Activity;Increase Strength and Stamina;Understanding of Exercise Prescription Increase Physical Activity;Increase Strength and Stamina;Understanding of Exercise Prescription;Able to understand and use rate of perceived exertion (RPE) scale;Able to  understand and use Dyspnea scale;Knowledge and understanding of Target Heart Rate Range (THRR);Able to check pulse independently Increase Physical Activity;Increase Strength and Stamina;Understanding of Exercise Prescription   Comments Reviewed RPE scale, THR and program prescription with pt today.  Pt voiced understanding and was given a copy of goals to take home. Master is off to a good start with his rehab sessions. He has been able to do his initial exercise prescription and even went up to 2% incline on the treadmill. He is not quite hitting his THR but will continue to monitor that as he progresses in the program. Landun continues to do well in rehab. He recently increased his overall average MET level to 2.76 METs. He also improved to level 4 on the T5 nustep and level 3 on the XR. He began using the recumbent elliptical at level 1 as well. He also increased his treadmill speed to 1.9 mph while maintaining an incline of 1.5%. We will continue to monitor his progress in the program. Reviewed home exercise with pt today.  Pt plans to walk at home for exercise.  He is going to build up to 30 min.  Reviewed THR, pulse, RPE, sign and symptoms, pulse oximetery and when to call 911 or MD.  Also discussed weather considerations and indoor options.  Pt voiced understanding. Jarod continues to do well in rehab. He did take some incline off of his treadmill. Some days he experiences low BP and nausea and accommodates workloads based off of that. He did try out the elliptical one day and was able to do level 1. He started using 5 lb handweights as well! We will continue to monitor.   Expected Outcomes Short: Use RPE daily to regulate intensity.  Long: Follow program prescription in THR. Short: Continue initial exercise prescription Long: Increase overall strength and stamina Short: Continue to progressively increase treadmill workload. Long: Continue to improve strength and stamina. Short: Start to build up time  walking at home Long; conitnue to exercise indpendently Short: Slowly add incline back to treadmill when appropriate Long: Continue to increase overall MET level and stamina    Row Name 09/22/22 1503 10/06/22 1549 10/22/22 1511 11/05/22 1636 11/18/22 0848     Exercise Goal Re-Evaluation   Exercise Goals Review Increase Physical Activity;Increase Strength and Stamina;Understanding of Exercise Prescription Increase Physical Activity;Increase Strength and Stamina;Understanding of Exercise Prescription Increase Physical Activity;Increase Strength and Stamina;Understanding of Exercise Prescription Increase Physical Activity;Increase Strength and Stamina;Understanding of Exercise Prescription Increase Physical Activity;Increase Strength and Stamina;Understanding of Exercise Prescription   Comments Alhassan has only attended rehab once since the last review due to issues with low BP and chest pain. During his one session he was able to work at level 4 on the T5 nustep for 30 minutes. He has a heart cath scheduled for 09/29/2022. He plans to return after this procedure. We will continue to monitor his progress when he returns to the program. Duriel has been placed on a medical hold as he has been getting evaluated for symptoms with testing including a heart cath. RN spoke with patient yesterday who advised patient to speak with his heart failure doctor at his f/u appt on 5/29 to seek clearance to return to rehab. Per patient, he has still been experiencing symptoms and therefore we need his MD to state whether or not he is cleared to exercise. We recently called to check on patient. He has been out on medical hold with chest pain and heart failure work up. He has not attended since 09/07/22 and will need clearance to return. We will continue to monitor his progress when he returns to the program. We recently called to check on patient. He has been out on medical hold with chest  pain and heart failure work up. He has not  attended since 09/07/22 and will need clearance to return. We will continue to monitor his progress when he returns to the program. Bismarck returned to rehab for two sessions after being out since 09/07/22. During his two sessions he was able to improve to level 6 on the T4 nustep and level 7 on the recumbent bike. He also continues to use 5 lb hand weights for resistance training. We will continue to monitor his progress in the program.   Expected Outcomes Short: Return to rehab when appropriate. Long: Continue to improve strength and stamina. Short: Talk to doctor and receive clearance to exercise Long: Graduate from [employer] Short: Talk to doctor and receive clearance to exercise. Long: Graduate from [employer]. Short: Talk to doctor and receive clearance to exercise. Long: Graduate from [employer]. Short: Return to walking in rehab. Long: Continue to improve strength and stamina.    Row Name 12/04/22 4403 12/16/22 1027           Exercise Goal Re-Evaluation   Exercise Goals Review Increase Physical Activity;Increase Strength and Stamina;Understanding of Exercise Prescription Increase Physical Activity;Increase Strength and Stamina;Understanding of Exercise Prescription      Comments Khaliel is doing well in rehab. He recently improved to level 9 on the recumbent bike and level 6 on the XR. He also continues to walk on the treadmill at a speed of 1.9 mph with no incline. We will continue to monitor his progress in the program. Amaje continues to do well in rehab. He is due for his post and will look to improve on it.  He recently improved to level 12 on the recumbent bike and level 7 on the XR. His treadmill speed did decline since the last review, going from 1.9 mph down to 1.6 mph. We will continue to monitor his progress in the program.      Expected Outcomes Short: Add incline to treadmill workload. Long: Continue exercise to improve strength and stamina. Short: Improve on post . Long: Continue  exercise to improve strength and stamina.               Discharge Exercise Prescription (Final Exercise Prescription Changes):  Exercise Prescription Changes - 12/16/22 1000       Response to Exercise   Blood Pressure (Admit) 112/70    Blood Pressure (Exit) 104/58    Heart Rate (Admit) 58 bpm    Heart Rate (Exercise) 89 bpm    Heart Rate (Exit) 60 bpm    Oxygen Saturation (Admit) 96 %    Oxygen Saturation (Exercise) 94 %    Oxygen Saturation (Exit) 96 %    Rating of Perceived Exertion (Exercise) 15    Perceived Dyspnea (Exercise) 3    Symptoms SOB    Duration Continue with 30 min of aerobic exercise without signs/symptoms of physical distress.    Intensity THRR unchanged      Progression   Progression Continue to progress workloads to maintain intensity without signs/symptoms of physical distress.    Average METs 2.3      Resistance Training   Training Prescription Yes    Weight 5 lb    Reps 10-15      Interval Training   Interval Training No      Treadmill   MPH 1.6    Grade 0    Minutes 15    METs 2.23      Recumbant Bike   Level 12  Watts 46    Minutes 15    METs 2.74      NuStep   Level 5    Minutes 30    METs 2.1      REL-XR   Level 7    Minutes 15    METs 3      Home Exercise Plan   Plans to continue exercise at Home (comment)   walking   Frequency Add 2 additional days to program exercise sessions.    Initial Home Exercises Provided 08/31/22      Oxygen   Maintain Oxygen Saturation 88% or higher             Nutrition:  Target Goals: Understanding of nutrition guidelines, daily intake of sodium 1500mg , cholesterol 200mg , calories 30% from fat and 7% or less from saturated fats, daily to have 5 or more servings of fruits and vegetables.  Education: All About Nutrition: -Group instruction provided by verbal, written material, interactive activities, discussions, models, and posters to present general guidelines for heart healthy  nutrition including fat, fiber, MyPlate, the role of sodium in heart healthy nutrition, utilization of the nutrition label, and utilization of this knowledge for meal planning. Follow up email sent as well. Written material given at graduation. Flowsheet Row Pulmonary Rehab from 11/11/2022 in [facility] Card[facility] and Pul[facility] Rehab  Education need identified 07/30/22  Date 09/02/22  [part 2]  Educator KW  Instruction Review Code 1- Verbalizes Understanding       Biometrics:  Pre Biometrics - 07/30/22 1112       Pre Biometrics   Height 5' 9.75" (1.772 m)    Weight 225 lb 9.6 oz (102.3 kg)    Waist Circumference 43 inches    Hip Circumference 44 inches    Waist to Hip Ratio 0.98 %    BMI (Calculated) 32.59    Single Leg Stand 30 seconds             Post Biometrics - 12/17/22 1556        Post  Biometrics   Height 5' 9.76" (1.772 m)    Weight 235 lb (106.6 kg)    BMI (Calculated) 33.95    Single Leg Stand 17 seconds             Nutrition Therapy Plan and Nutrition Goals:  Nutrition Therapy & Goals - 07/30/22 1038       Nutrition Therapy   Diet Heart healthy, low Na    Drug/Food Interactions Statins/Certain Fruits    Protein (specify units) 80-85g    Fiber 35 grams    Whole Grain Foods 3 servings    Saturated Fats 16 max. grams    Fruits and Vegetables 8 servings/day    Sodium 2 grams      Personal Nutrition Goals   Nutrition Goal ST: practice MyPlate guidelines, practice reading food labels  LT: limit Na <2g/day, limit saturated fat <16g/day, aim for 30-35g of fiber per day    Comments 52 y.o. M admitted to pulmonary rehab for Chronic combined systolic and diastolic congestive heart failure. PMHx includes CAD, CKD stg 2, crohn's disease, HTN, pulmonary artery hypertension. Reviewed relevant medications atorvastatin, jardiance, zetia, pepcid, furosemide. Reviewed most recent labs. Obed reports that his Crohns has been well controlled and his main trigger foods are  certain types of processed meats; eating smaller/more frequent meals help to manage his symptoms as well. Travonn reports limiting chinese food, pizza, and fried foods. He has been trying to use a  grill for his cooking to avoid lots of added fat or grease. He has been trying to cut back on soda and now only has it occassionally. Discussed food patterns and heart healthy eating as well as MyPlate. Encouagred him to read labels, choose liquid cooking oils, and to include protein, healthy fat, and fiber at meals to make them more nutrient dense and satiating. Discussed CKD stg 2 MNT and how a modest protei is recommeneded (0.8 g/kg/d) - encouraged to be mindful of animal protein portions and to include plant protein.      Intervention Plan   Intervention Prescribe, educate and counsel regarding individualized specific dietary modifications aiming towards targeted core components such as weight, hypertension, lipid management, diabetes, heart failure and other comorbidities.;Nutrition handout(s) given to patient.    Expected Outcomes Short Term Goal: Understand basic principles of dietary content, such as calories, fat, sodium, cholesterol and nutrients.;Short Term Goal: A plan has been developed with personal nutrition goals set during dietitian appointment.;Long Term Goal: Adherence to prescribed nutrition plan.             Nutrition Assessments:  MEDIFICTS Score Key: ?70 Need to make dietary changes  40-70 Heart Healthy Diet ? 40 Therapeutic Level Cholesterol Diet  Flowsheet Row Pulmonary Rehab from 07/28/2022 in [facility] Cardiac and Pulmonary Rehab  Picture Your Plate Total Score on Admission 51      Picture Your Plate Scores: <09 Unhealthy dietary pattern with much room for improvement. 41-50 Dietary pattern unlikely to meet recommendations for good health and room for improvement. 51-60 More healthful dietary pattern, with some room for improvement.  >60 Healthy dietary pattern, although  there may be some specific behaviors that could be improved.   Nutrition Goals Re-Evaluation:  Nutrition Goals Re-Evaluation     Row Name 08/17/22 1602 11/16/22 1552 11/30/22 1601         Goals   Current Weight 230 lb (104.3 kg) 229 lb (103.9 kg) 228 lb (103.4 kg)     Nutrition Goal Lose some weight -- --     Comment Patient was informed on why it is important to maintain a balanced diet when dealing with Respiratory issues. Explained that it takes a lot of energy to breath and when they are short of breath often they will need to have a good diet to help keep up with the calories they are expending for breathing. Patient defers RD appointment. Patient defers RD appointment.     Expected Outcome Short: Choose and plan snacks accordingly to patients caloric intake to improve breathing. Long: Maintain a diet independently that meets their caloric intake to aid in daily shortness of breath. -- --              Nutrition Goals Discharge (Final Nutrition Goals Re-Evaluation):  Nutrition Goals Re-Evaluation - 11/30/22 1601       Goals   Current Weight 228 lb (103.4 kg)    Comment Patient defers RD appointment.             Psychosocial: Target Goals: Acknowledge presence or absence of significant depression and/or stress, maximize coping skills, provide positive support system. Participant is able to verbalize types and ability to use techniques and skills needed for reducing stress and depression.   Education: Stress, Anxiety, and Depression - Group verbal and visual presentation to define topics covered.  Reviews how body is impacted by stress, anxiety, and depression.  Also discusses healthy ways to reduce stress and to treat/manage anxiety and depression.  Written material given at graduation.   Education: Sleep Hygiene -Provides group verbal and written instruction about how sleep can affect your health.  Define sleep hygiene, discuss sleep cycles and impact of sleep habits.  Review good sleep hygiene tips.    Initial Review & Psychosocial Screening:  Initial Psych Review & Screening - 07/28/22 1349       Initial Review   Current issues with Current Stress Concerns    Source of Stress Concerns Chronic Illness      Family Dynamics   Good Support System? Yes   mother     Barriers   Psychosocial barriers to participate in program There are no identifiable barriers or psychosocial needs.;The patient should benefit from training in stress management and relaxation.      Screening Interventions   Interventions To provide support and resources with identified psychosocial needs;Encouraged to exercise;Provide feedback about the scores to participant    Expected Outcomes Short Term goal: Utilizing psychosocial counselor, staff and physician to assist with identification of specific Stressors or current issues interfering with healing process. Setting desired goal for each stressor or current issue identified.;Long Term Goal: Stressors or current issues are controlled or eliminated.;Short Term goal: Identification and review with participant of any Quality of Life or Depression concerns found by scoring the questionnaire.;Long Term goal: The participant improves quality of Life and PHQ9 Scores as seen by post scores and/or verbalization of changes             Quality of Life Scores:  Scores of 19 and below usually indicate a poorer quality of life in these areas.  A difference of  2-3 points is a clinically meaningful difference.  A difference of 2-3 points in the total score of the Quality of Life Index has been associated with significant improvement in overall quality of life, self-image, physical symptoms, and general health in studies assessing change in quality of life.  PHQ-9: Review Flowsheet  More data exists      09/18/2022 08/17/2022 07/30/2022 07/15/2022 10/29/2021  Depression screen PHQ 2/9  Decreased Interest 0 0 3 0 0  Down, Depressed, Hopeless 0 0 0 0  0  PHQ - 2 Score 0 0 3 0 0  Altered sleeping - 0 3 0 -  Tired, decreased energy - 1 3 0 -  Change in appetite - 3 2 0 -  Feeling bad or failure about yourself  - 0 0 0 -  Trouble concentrating - 0 0 0 -  Moving slowly or fidgety/restless - 0 0 0 -  Suicidal thoughts - 0 0 0 -  PHQ-9 Score - 4 11 0 -  Difficult doing work/chores - Not difficult at all Very difficult Not difficult at all -    Details           Interpretation of Total Score  Total Score Depression Severity:  1-4 = Minimal depression, 5-9 = Mild depression, 10-14 = Moderate depression, 15-19 = Moderately severe depression, 20-27 = Severe depression   Psychosocial Evaluation and Intervention:  Psychosocial Evaluation - 07/28/22 1418       Psychosocial Evaluation & Interventions   Interventions Stress management education;Relaxation education;Encouraged to exercise with the program and follow exercise prescription    Comments Virgel is coming to Pulmonary Rehab for combined heart failure. His most recent EF is 35-40%. He reports having two heart attacks in the past. 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 He is living with  his mother since his heart attack. He is hesitant to exercise or get back to his normal routine because he is scared about his heart and doesn't want anything to happen to him for his mother's sake. He mentioned that if he goes out, he always makes sure he is close to a hospital in case something happens. He enjoys talking about his job, but doesn't know if he will be able to do all that is required physically. 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 He wants to come to the program to work on his strength and stamina. He also wants more education on what is going on with his heart and his prognosis.    Expected Outcomes Short: attend pulmonary rehab for education and exercise. Long: develop  and maintain positive self care habits.    Continue Psychosocial Services  Follow up required by staff             Psychosocial Re-Evaluation:  Psychosocial Re-Evaluation     Row Name 08/17/22 1604 11/16/22 1557 11/30/22 1601         Psychosocial Re-Evaluation   Current issues with Current Stress Concerns None Identified None Identified     Comments Reviewed patient health questionnaire (PHQ-9) with patient for follow up. Previously, patients score indicated signs/symptoms of depression.  Reviewed to see if patient is improving symptom wise while in program.  Score improved and patient states that it is because he has been able to have more energy. Patient reports no issues with their current mental states, sleep, stress, depression or anxiety. Will follow up with patient in a few weeks for any changes. Patient reports no issues with their current mental states, sleep, stress, depression or anxiety. Will follow up with patient in a few weeks for any changes.     Expected Outcomes Short: Continue to attend [facility] regularly for regular exercise and social engagement. Long: Continue to improve symptoms and manage a positive mental state. Short: Continue to exercise regularly to support mental health and notify staff of any changes. Long: maintain mental health and well being through teaching of rehab or prescribed medications independently. Short: Continue to exercise regularly to support mental health and notify staff of any changes. Long: maintain mental health and well being through teaching of rehab or prescribed medications independently.     Interventions -- Encouraged to attend Pulmonary Rehabilitation for the exercise Encouraged to attend Pulmonary Rehabilitation for the exercise     Continue Psychosocial Services  Follow up required by staff Follow up required by staff Follow up required by staff              Psychosocial Discharge (Final Psychosocial Re-Evaluation):   Psychosocial Re-Evaluation - 11/30/22 1601       Psychosocial Re-Evaluation   Current issues with None Identified    Comments Patient reports no issues with their current mental states, sleep, stress, depression or anxiety. Will follow up with patient in a few weeks for any changes.    Expected Outcomes Short: Continue to exercise regularly to support mental health and notify staff of any changes. Long: maintain mental health and well being through teaching of rehab or prescribed medications independently.    Interventions Encouraged to attend Pulmonary Rehabilitation for the exercise    Continue Psychosocial Services  Follow up required by staff             Education: Education Goals: Education classes will be provided  on a weekly basis, covering required topics. Participant will state understanding/return demonstration of topics presented.  Learning Barriers/Preferences:  Learning Barriers/Preferences - 07/28/22 1335       Learning Barriers/Preferences   Learning Barriers None    Learning Preferences None             General Pulmonary Education Topics:  Infection Prevention: - Provides verbal and written material to individual with discussion of infection control including proper hand washing and proper equipment cleaning during exercise session. Flowsheet Row Pulmonary Rehab from 11/11/2022 in [facility] Card[facility] and Pulmonary Rehab  Date 07/30/22  Educator NT  Instruction Review Code 1- Verbalizes Understanding       Falls Prevention: - Provides verbal and written material to individual with discussion of falls prevention and safety. Flowsheet Row Pulmonary Rehab from 11/11/2022 in [facility] Cardiac and Pulmonary Rehab  Date 07/30/22  Educator NT  Instruction Review Code 1- Verbalizes Understanding       Chronic Lung Disease Review: - Group verbal instruction with posters, models, PowerPoint presentations and videos,  to review new updates, new respiratory medications,  new advancements in procedures and treatments. Providing information on websites and "800" numbers for continued self-education. Includes information about supplement oxygen, available portable oxygen systems, continuous and intermittent flow rates, oxygen safety, concentrators, and Medicare reimbursement for oxygen. Explanation of Pulmonary Drugs, including class, frequency, complications, importance of spacers, rinsing mouth after steroid MDI's, and proper cleaning methods for nebulizers. Review of basic lung anatomy and physiology related to function, structure, and complications of lung disease. Review of risk factors. Discussion about methods for diagnosing sleep apnea and types of masks and machines for OSA. Includes a review of the use of types of environmental controls: home humidity, furnaces, filters, dust mite/pet prevention, HEPA vacuums. Discussion about weather changes, air quality and the benefits of nasal washing. Instruction on Warning signs, infection symptoms, calling MD promptly, preventive modes, and value of vaccinations. Review of effective airway clearance, coughing and/or vibration techniques. Emphasizing that all should Create an Action Plan. Written material given at graduation.   AED/CPR: - Group verbal and written instruction with the use of models to demonstrate the basic use of the AED with the basic ABC's of resuscitation.    Anatomy and Cardiac Procedures: - Group verbal and visual presentation and models provide information about basic cardiac anatomy and function. Reviews the testing methods done to diagnose heart disease and the outcomes of the test results. Describes the treatment choices: Medical Management, Angioplasty, or Coronary Bypass Surgery for treating various heart conditions including Myocardial Infarction, Angina, Valve Disease, and Cardiac Arrhythmias.  Written material given at graduation. Flowsheet Row Pulmonary Rehab from 11/11/2022 in [facility] Cardiac and  Pulmonary Rehab  Education need identified 07/30/22       Medication Safety: - Group verbal and visual instruction to review commonly prescribed medications for heart and lung disease. Reviews the medication, class of the drug, and side effects. Includes the steps to properly store meds and maintain the prescription regimen.  Written material given at graduation. Flowsheet Row Pulmonary Rehab from 11/11/2022 in [facility] Cardiac and Pulmonary Rehab  Education need identified 07/30/22       Other: -Provides group and verbal instruction on various topics (see comments) Flowsheet Row Pulmonary Rehab from 11/11/2022 in [facility] Cardiac and Pulmonary Rehab  Date 11/11/22  Educator MS  [jeopardy]  Instruction Review Code 1- Verbalizes Understanding       Knowledge Questionnaire Score:  Knowledge Questionnaire Score - 07/28/22 1458  Knowledge Questionnaire Score   Pre Score 15/26              Core Components/Risk Factors/Patient Goals at Admission:  Personal Goals and Risk Factors at Admission - 07/30/22 1110       Core Components/Risk Factors/Patient Goals on Admission    Weight Management Yes;Weight Loss    Intervention Weight Management: Develop a combined nutrition and exercise program designed to reach desired caloric intake, while maintaining appropriate intake of nutrient and fiber, sodium and fats, and appropriate energy expenditure required for the weight goal.;Weight Management: Provide education and appropriate resources to help participant work on and attain dietary goals.;Weight Management/Obesity: Establish reasonable short term and long term weight goals.    Admit Weight 225 lb 9.6 oz (102.3 kg)    Goal Weight: Short Term 215 lb (97.5 kg)    Goal Weight: Long Term 195 lb (88.5 kg)    Expected Outcomes Weight Loss: Understanding of general recommendations for a balanced deficit meal plan, which promotes 1-2 lb weight loss per week and includes a negative energy balance  of [phone] kcal/d;Short Term: Continue to assess and modify interventions until short term weight is achieved;Long Term: Adherence to nutrition and physical activity/exercise program aimed toward attainment of established weight goal;Understanding recommendations for meals to include 15-35% energy as protein, 25-35% energy from fat, 35-60% energy from carbohydrates, less than 200mg  of dietary cholesterol, 20-35 gm of total fiber daily;Understanding of distribution of calorie intake throughout the day with the consumption of 4-5 meals/snacks    Heart Failure Yes    Intervention Provide a combined exercise and nutrition program that is supplemented with education, support and counseling about heart failure. Directed toward relieving symptoms such as shortness of breath, decreased exercise tolerance, and extremity edema.    Expected Outcomes Improve functional capacity of life;Short term: Attendance in program 2-3 days a week with increased exercise capacity. Reported lower sodium intake. Reported increased fruit and vegetable intake. Reports medication compliance.;Long term: Adoption of self-care skills and reduction of barriers for early signs and symptoms recognition and intervention leading to self-care maintenance.;Short term: Daily weights obtained and reported for increase. Utilizing diuretic protocols set by physician.    Hypertension Yes    Intervention Provide education on lifestyle modifcations including regular physical activity/exercise, weight management, moderate sodium restriction and increased consumption of fresh fruit, vegetables, and low fat dairy, alcohol moderation, and smoking cessation.;Monitor prescription use compliance.    Expected Outcomes Long Term: Maintenance of blood pressure at goal levels.;Short Term: Continued assessment and intervention until BP is < 140/38mm HG in hypertensive participants. < 130/61mm HG in hypertensive participants with diabetes, heart failure or chronic  kidney disease.    Lipids Yes    Intervention Provide education and support for participant on nutrition & aerobic/resistive exercise along with prescribed medications to achieve LDL 70mg , HDL >40mg .    Expected Outcomes Short Term: Participant states understanding of desired cholesterol values and is compliant with medications prescribed. Participant is following exercise prescription and nutrition guidelines.;Long Term: Cholesterol controlled with medications as prescribed, with individualized exercise RX and with personalized nutrition plan. Value goals: LDL < 70mg , HDL > 40 mg.             Education:Diabetes - Individual verbal and written instruction to review signs/symptoms of diabetes, desired ranges of glucose level fasting, after meals and with exercise. Acknowledge that pre and post exercise glucose checks will be done for 3 sessions at entry of program.   Know Your Numbers and  Heart Failure: - Group verbal and visual instruction to discuss disease risk factors for cardiac and pulmonary disease and treatment options.  Reviews associated critical values for Overweight/Obesity, Hypertension, Cholesterol, and Diabetes.  Discusses basics of heart failure: signs/symptoms and treatments.  Introduces Heart Failure Zone chart for action plan for heart failure.  Written material given at graduation. Flowsheet Row Pulmonary Rehab from 11/11/2022 in [facility] Cardiac and Pulmonary Rehab  Education need identified 07/30/22       Core Components/Risk Factors/Patient Goals Review:   Goals and Risk Factor Review     Row Name 08/17/22 1602 11/16/22 1554 11/30/22 1559         Core Components/Risk Factors/Patient Goals Review   Personal Goals Review Improve shortness of breath with ADL's Weight Management/Obesity Develop more efficient breathing techniques such as purse lipped breathing and diaphragmatic breathing and practicing self-pacing with activity.     Review Spoke to patient about their  shortness of breath and what they can do to improve. Patient has been informed of breathing techniques when starting the program. Patient is informed to tell staff if they have had any med changes and that certain meds they are taking or not taking can be causing shortness of breath. Layten wants to lose weight. His weight goal is 190 pounds. He is going to try to lose some weight in the next few weeks. He eats alot more starches than he should. He is going to try to limit these foods. Diaphragmatic and PLB breathing explained and performed with patient. Patient has a better understanding of how to do these exercises to help with breathing performance and relaxation. Patient performed breathing techniques adequately and to practice further at home.     Expected Outcomes Short: Attend [facility] regularly to improve shortness of breath with ADL's. Long: maintain independence with ADL's Short: lose a few pounds in the next few weeks. Long: reach a weight goal. Short: practice PLB and diaphragmatic breathing at home. Long: Use PLB and diaphragmatic breathing independently post [facility].              Core Components/Risk Factors/Patient Goals at Discharge (Final Review):   Goals and Risk Factor Review - 11/30/22 1559       Core Components/Risk Factors/Patient Goals Review   Personal Goals Review Develop more efficient breathing techniques such as purse lipped breathing and diaphragmatic breathing and practicing self-pacing with activity.    Review Diaphragmatic and PLB breathing explained and performed with patient. Patient has a better understanding of how to do these exercises to help with breathing performance and relaxation. Patient performed breathing techniques adequately and to practice further at home.    Expected Outcomes Short: practice PLB and diaphragmatic breathing at home. Long: Use PLB and diaphragmatic breathing independently post [facility].             ITP Comments:  ITP  Comments     Row Name 07/28/22 1308 07/30/22 1108 08/03/22 1539 08/05/22 0831 09/02/22 1159   ITP Comments Initial orientation completed. Diagnosis can be found in [facility] 2/28. EP Orientation scheduled for Thursday 3/14 at 9:30am. Completed and gym orientation. Initial ITP created and sent for review to Dr. Vida Rigger, Medical Director. First full day of exercise!  Patient was oriented to gym and equipment including functions, settings, policies, and procedures.  Patient's individual exercise prescription and treatment plan were reviewed.  All starting workloads were established based on the results of the 6 minute walk test done at initial orientation visit.  The  plan for exercise progression was also introduced and progression will be customized based on patient's performance and goals. 30 Day review completed. Medical Director ITP review done, changes made as directed, and signed approval by Medical Director.     new to program 30 day review completed. ITP sent to Dr. Jinny Sanders, Medical Director of  Pulmonary Rehab. Continue with ITP unless changes are made by physician.    Row Name 09/21/22 1531 09/29/22 0835 09/30/22 0847 09/30/22 1154 10/06/22 1553   ITP Comments Maurine Minister stopped by to inform staff that they are wanting to do a heart cath next week because of his symptoms. He is following closely with his doctors to manage his blood pressure concerns and his chest pain. He will come back to class after his heart cath. Since Dorian has been out on medical hold, we have not been able to assess for goals this round. 30 Day review completed. Medical Director ITP review done, changes made as directed, and signed approval by Medical Director.   out with medical concerns.  admitted to [facility] 5/14 Called patient to follow up on medical hold with pulmonary rehab. They are still adjusting some of his medications and he is due to see his cardiologist today, 5/15 for a follow up appointment with Dr. Brion Aliment.  Patient will discuss with him on what they want to do and if he is appropriate to return to rehab. Patient is aware he needs to be feeling good and needs to receive clearance to return to rehab. Patient verbalized understanding. Remain patient on medical hold until further notice. Jahvion continues his medical hold as he has been getting evaluated for symptoms with testing including a heart cath. RN spoke with patient yesterday who advised patient to speak with his heart failure doctor at his f/u appt on 5/29 to seek clearance to return to rehab. Per patient, he has still been experiencing symptoms and therefore we need his MD to state whether or not he is cleared to exercise.    Row Name 10/22/22 1426 10/28/22 1100 11/11/22 1122 11/24/22 1431 12/23/22 1150   ITP Comments Called to check on patient.  He has been out with chest pain and heart failure work up.  He has not attended since 09/07/22.  Unable to assess for goal again 30 Day review completed. Medical Director ITP review done, changes made as directed, and signed approval by Medical Director.   out medical reason Mr. Nagy came in to rehab today to notify staff that he was seen by Dr. Shirlee Latch today in the heart failure clinic and was cleared to restart rehab. Per ov note 11/11/22 pt may return to cardiac rehab. Pt is scheduled to restart this afternoon at 3:30 pm. 30 Day review completed. Medical Director ITP review done, changes made as directed, and signed approval by Medical Director. 30 Day review completed. Medical Director ITP review done, changes made as directed, and signed approval by Medical Director.            Comments:

## 2022-12-24 ENCOUNTER — Encounter: Payer: Medicaid Other | Admitting: *Deleted

## 2022-12-24 ENCOUNTER — Encounter: Payer: Self-pay | Admitting: Family

## 2022-12-24 DIAGNOSIS — I5042 Chronic combined systolic (congestive) and diastolic (congestive) heart failure: Secondary | ICD-10-CM

## 2022-12-24 NOTE — Progress Notes (Signed)
Daily Session Note  Patient Details  Name: Clarence [name] MRN: [id number] Date of Birth: 06/04/1970 Referring Provider:   Flowsheet Row Pulmonary Rehab from 07/30/2022 in [facility] Cardiac and Pulmonary Rehab  Referring Provider Dr. Yvonne Kendall, MD       Encounter Date: 12/24/2022  Check In:  Session Check In - 12/24/22 1743       Check-In   Supervising physician immediately available to respond to emergencies See telemetry face sheet for immediately available ER MD    Location [facility]-Cardiac & Pulmonary Rehab    Staff Present Cora Collum, RN, BSN, CCRP;Joseph [place], RCP,RRT,BSRT;Other   Rory Percy MS,Exercise Physiologist; Serita Sheller BS, Exercise Physiologist   Virtual Visit No    Medication changes reported     No    Fall or balance concerns reported    No    Warm-up and Cool-down Performed on first and last piece of equipment    Resistance Training Performed Yes    VAD Patient? No    PAD/SET Patient? No      Pain Assessment   Currently in Pain? No/denies                Social History   Tobacco Use  Smoking Status Former   Types: Cigars   Quit date: 08/13/2021   Years since quitting: 1.3  Smokeless Tobacco Not on file    Goals Met:  Proper associated with RPD/PD & O2 Sat Independence with exercise equipment Exercise tolerated well No report of concerns or symptoms today  Goals Unmet:  Not Applicable  Comments: Pt able to follow exercise prescription today without complaint.  Will continue to monitor for progression.    Dr. Bethann Punches is Medical Director for [facility] Cardiac Rehabilitation.  Dr. Vida Rigger is Medical Director for [facility] Pulmonary Rehabilitation.

## 2022-12-28 ENCOUNTER — Emergency Department
Admission: EM | Admit: 2022-12-28 | Discharge: 2022-12-28 | Disposition: A | Discharge status: Home / Self Care | Payer: Medicaid Other | Attending: Emergency Medicine | Admitting: Emergency Medicine

## 2022-12-28 ENCOUNTER — Other Ambulatory Visit: Payer: Self-pay

## 2022-12-28 ENCOUNTER — Emergency Department: Payer: Medicaid Other

## 2022-12-28 DIAGNOSIS — I130 Hypertensive heart and chronic kidney disease with heart failure and stage 1 through stage 4 chronic kidney disease, or unspecified chronic kidney disease: Secondary | ICD-10-CM | POA: Diagnosis not present

## 2022-12-28 DIAGNOSIS — M79662 Pain in left lower leg: Secondary | ICD-10-CM | POA: Diagnosis not present

## 2022-12-28 DIAGNOSIS — M7989 Other specified soft tissue disorders: Secondary | ICD-10-CM | POA: Insufficient documentation

## 2022-12-28 DIAGNOSIS — I5043 Acute on chronic combined systolic (congestive) and diastolic (congestive) heart failure: Secondary | ICD-10-CM | POA: Diagnosis not present

## 2022-12-28 DIAGNOSIS — N1832 Chronic kidney disease, stage 3b: Secondary | ICD-10-CM | POA: Diagnosis not present

## 2022-12-28 DIAGNOSIS — R202 Paresthesia of skin: Secondary | ICD-10-CM | POA: Diagnosis not present

## 2022-12-28 DIAGNOSIS — I25119 Atherosclerotic heart disease of native coronary artery with unspecified angina pectoris: Secondary | ICD-10-CM | POA: Diagnosis not present

## 2022-12-28 DIAGNOSIS — M79661 Pain in right lower leg: Secondary | ICD-10-CM | POA: Diagnosis not present

## 2022-12-28 LAB — CBC WITH DIFFERENTIAL/PLATELET
Abs Immature Granulocytes: 0.02 10*3/uL (ref 0.00–0.07)
Basophils Absolute: 0 10*3/uL (ref 0.0–0.1)
Basophils Relative: 0 %
Eosinophils Absolute: 0.2 10*3/uL (ref 0.0–0.5)
Eosinophils Relative: 2 %
HCT: 44.8 % (ref 39.0–52.0)
Hemoglobin: 14.9 g/dL (ref 13.0–17.0)
Immature Granulocytes: 0 %
Lymphocytes Relative: 23 %
Lymphs Abs: 1.8 10*3/uL (ref 0.7–4.0)
MCH: 28 pg (ref 26.0–34.0)
MCHC: 33.3 g/dL (ref 30.0–36.0)
MCV: 84.2 fL (ref 80.0–100.0)
Monocytes Absolute: 0.8 10*3/uL (ref 0.1–1.0)
Monocytes Relative: 10 %
Neutro Abs: 4.8 10*3/uL (ref 1.7–7.7)
Neutrophils Relative %: 65 %
Platelets: 217 10*3/uL (ref 150–400)
RBC: 5.32 MIL/uL (ref 4.22–5.81)
RDW: 13.8 % (ref 11.5–15.5)
WBC: 7.6 10*3/uL (ref 4.0–10.5)
nRBC: 0 % (ref 0.0–0.2)

## 2022-12-28 LAB — BASIC METABOLIC PANEL
Anion gap: 7 (ref 5–15)
BUN: 22 mg/dL — ABNORMAL HIGH (ref 6–20)
CO2: 24 mmol/L (ref 22–32)
Calcium: 9.4 mg/dL (ref 8.9–10.3)
Chloride: 106 mmol/L (ref 98–111)
Creatinine, Ser: 1.83 mg/dL — ABNORMAL HIGH (ref 0.61–1.24)
GFR, Estimated: 44 mL/min — ABNORMAL LOW (ref >60)
Glucose, Bld: 118 mg/dL — ABNORMAL HIGH (ref 70–99)
Potassium: 4.4 mmol/L (ref 3.5–5.1)
Sodium: 137 mmol/L (ref 135–145)

## 2022-12-28 LAB — D-DIMER, QUANTITATIVE: D-Dimer, Quant: 1.27 ug/mL-FEU — ABNORMAL HIGH (ref 0.00–0.50)

## 2022-12-28 MED ORDER — GABAPENTIN 100 MG PO CAPS: 100.0000 mg | ORAL_CAPSULE | Freq: Three times a day (TID) | ORAL | 0 refills | Status: DC | PRN

## 2022-12-28 NOTE — ED Triage Notes (Addendum)
Pt arrives via POV w/ c/o intermittent bilat leg numbness from shin to foot and L shoulder numbness x2 weeks. Pt ambulatory in triage. Pt states leg numbness "is very mild right now".

## 2022-12-28 NOTE — Discharge Instructions (Signed)
Your ultrasound did not reveal DVT.  You may use the medication as prescribed to help with your symptoms.  Remember that it can make you sleepy, so do not drive, operate heavy machinery, or perform any test require concentration while taking this medication.  Please return for any new, worsening, or changing symptoms or other concerns.  It was a pleasure caring for you today.

## 2022-12-28 NOTE — ED Triage Notes (Addendum)
Error in charting.

## 2022-12-28 NOTE — ED Provider Notes (Signed)
[facility] Provider Note    Event Date/Time   First MD Initiated Contact with Patient 12/28/22 1438     (approximate)   History   Numbness   HPI  Clarence [name] is a 52 y.o. male with a past medical history of pulmonary hypertension, CKD, failure who presents today for evaluation of bilateral paresthesias in his lower extremities.  He reports that one of the nurses at his pulmonary rehab clinic told him that it could be a blood clot and now he is very concerned that he has a blood clot.  He has never had a blood clot before.  He has not noticed any swelling in his legs.  He has been compliant with his medications.  He reports that the numbness and tingling stops at around his ankle bilaterally.  He has not noticed elsewhere.  It has been ongoing for approximately 4 weeks.  Patient Active Problem List   Diagnosis Date Noted   CHF (congestive heart failure) (HCC) 09/29/2022   Tinea cruris 09/14/2022   Folliculitis 09/14/2022   Chest pain 09/11/2022   Hypotension 09/11/2022   Functional diarrhea 09/11/2022   Dizziness 09/10/2022   Coronary artery disease of native artery of native heart with stable angina pectoris (HCC) 03/12/2022   Mixed hyperlipidemia 03/12/2022   Excessive daytime sleepiness 10/31/2021   Ischemic cardiomyopathy 10/21/2021   Pulmonary hypertension (HCC) 10/21/2021   Demand ischemia , possible NSTEMI 10/21/2021   NSTEMI (non-ST elevated myocardial infarction) (HCC) 10/21/2021   Acute renal failure with acute tubular necrosis superimposed on stage 3b chronic kidney disease (HCC) 10/08/2021   Hypertensive urgency 10/07/2021   Chronic HFrEF (heart failure with reduced ejection fraction) (HCC) 10/07/2021   Hypotension due to medication 09/15/2021   Hypertensive emergency 09/09/2021   Acute on chronic combined systolic and diastolic CHF (congestive heart failure) (HCC) 09/09/2021   Dyslipidemia 09/09/2021   Atypical pneumonia 08/05/2021    CKD (chronic kidney disease) stage 2, GFR 60-89 ml/min 08/05/2021   Essential hypertension 08/04/2021   Crohn's disease (HCC) 08/04/2021   Pain in joint of left shoulder 08/30/2017   Impingement syndrome of left shoulder region 08/30/2017   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          Physical Exam   Triage Vital Signs: ED Triage Vitals  Encounter Vitals Group     BP 12/28/22 1331 118/81     Systolic BP Percentile --      Diastolic BP Percentile --      Pulse Rate 12/28/22 1331 71     Resp 12/28/22 1331 19     Temp 12/28/22 1331 97.7 F (36.5 C)     Temp Source 12/28/22 1331 Oral     SpO2 12/28/22 1331 96 %     Weight 12/28/22 1332 230 lb (104.3 kg)     Height 12/28/22 1332 5\' 9"  (1.753 m)     Head Circumference --      Peak Flow --      Pain Score 12/28/22 1331 0     Pain Loc --      Pain Education --      Exclude from Growth Chart --     Most recent vital signs: Vitals:   12/28/22 1331  BP: 118/81  Pulse: 71  Resp: 19  Temp: 97.7 F (36.5 C)  SpO2: 96%    Physical Exam Vitals and nursing note reviewed.  Constitutional:      General: Awake and alert. No acute distress.  Appearance: Normal appearance. The patient is normal weight.  HENT:     Head: Normocephalic and atraumatic.     Mouth: Mucous membranes are moist.  Eyes:     General: PERRL. Normal EOMs        Right eye: No discharge.        Left eye: No discharge.     Conjunctiva/sclera: Conjunctivae normal.  Cardiovascular:     Rate and Rhythm: Normal rate and regular rhythm.     Pulses: Normal pulses.  Pulmonary:     Effort: Pulmonary effort is normal. No respiratory distress.     Breath sounds: Normal breath sounds.  Abdominal:     Abdomen is soft. There is no abdominal tenderness. No rebound or guarding. No distention. Musculoskeletal:        General: No swelling. Normal range of motion.     Cervical back: Normal range of motion and neck supple.  Normal-appearing bilateral lower  extremities without pitting edema, erythema, or wounds noted.  2+ pedal pulses bilaterally, normal capillary refill.  Normal range of motion of all toes, ankle, knee, hip. Skin:    General: Skin is warm and dry.     Capillary Refill: Capillary refill takes less than 2 seconds.     Findings: No rash.  Neurological:     Mental Status: The patient is awake and alert.      ED Results / Procedures / Treatments   Labs (all labs ordered are listed, but only abnormal results are displayed) Labs Reviewed  D-DIMER, QUANTITATIVE - Abnormal; Notable for the following components:      Result Value   D-Dimer, Quant 1.27 (*)    All other components within normal limits  BASIC METABOLIC PANEL - Abnormal; Notable for the following components:   Glucose, Bld 118 (*)    BUN 22 (*)    Creatinine, Ser 1.83 (*)    GFR, Estimated 44 (*)    All other components within normal limits  CBC WITH DIFFERENTIAL/PLATELET     EKG     RADIOLOGY I independently reviewed and interpreted imaging and agree with radiologists findings.     PROCEDURES:  Critical Care performed:   Procedures   MEDICATIONS ORDERED IN ED: Medications - No data to display   IMPRESSION / MDM / ASSESSMENT AND PLAN / ED COURSE  I reviewed the triage vital signs and the nursing notes.   Differential diagnosis includes, but is not limited to, neuropathy, electrolyte disarray, DVT.  Patient is awake and alert, hemodynamically stable and afebrile.  I am suspicious for neuropathy given his bilateral stocking distribution and his multiple comorbidities.  However, he was told that this could be a DVT by a nurse at the pulmonary hypertension clinic and that is what he is worried about today.  D-dimer obtained is elevated.  Will proceed with bilateral lower extremity ultrasounds.  No tachycardia or hypoxia to suggest pulmonary embolism.  Lower extremity ultrasounds are normal.  He has no pitting edema in his bilateral lower  extremities, no JVD, no crackles or rails, no DOE, no orthopnea, he does not appear to be volume overloaded.  I do suspect neuropathy as the most likely etiology at this point.  Offered prescription for gabapentin which patient agreed to.  Advised that this may make him sleepy, and he was advised that he cannot drive, operate heavy machinery, or perform any test that require concentration while take this medication.  Patient understands and agrees with plan.  He was discharged in  stable condition.   Patient's presentation is most consistent with acute complicated illness / injury requiring diagnostic workup.      FINAL CLINICAL IMPRESSION(S) / ED DIAGNOSES   Final diagnoses:  Paresthesias     Rx / DC Orders   ED Discharge Orders          Ordered    gabapentin (NEURONTIN) 100 MG capsule  3 times daily PRN        12/28/22 1638             Note:  This document was prepared using Dragon voice recognition software and may include unintentional dictation errors.   Keturah Shavers 12/28/22 1732    Pilar Jarvis, MD 12/28/22 1739

## 2022-12-29 ENCOUNTER — Telehealth: Payer: Medicaid Other

## 2022-12-29 NOTE — Transitions of Care (Post Inpatient/ED Visit) (Signed)
12/29/2022  Name: Clarence [name] MRN: [id number] DOB: June 28, 1970  Today's TOC FU Call Status: Today's TOC FU Call Status:: Successful TOC FU Call Completed TOC FU Call Complete Date: 12/29/22  Transition Care Management Follow-up Telephone Call Date of Discharge: 12/28/22 Discharge Facility: [facility]) Type of Discharge: Emergency Department Reason for ED Visit: Other: (Numbness) How have you been since you were released from the hospital?: Same Any questions or concerns?: Yes Patient Questions/Concerns:: Patient will save for appointment  Items Reviewed: Did you receive and understand the discharge instructions provided?: Yes Medications obtained,verified, and reconciled?: Yes (Medications Reviewed) Any new allergies since your discharge?: No Dietary orders reviewed?: No Do you have support at home?: No  Medications Reviewed Today: Medications Reviewed Today     Reviewed by Dicky Doe, CMA (Certified Medical Assistant) on 12/29/22 at 0919  Med List Status: <None>   Medication Order Taking? Sig Documenting Provider Last Dose Status Informant  acetaminophen (TYLENOL) 325 MG tablet [id number] Yes Take 650 mg by mouth at bedtime. [provider] Taking Active Self  aspirin EC 81 MG tablet [id number] Yes Take 1 tablet (81 mg total) by mouth daily. Swallow whole. [place], Anderson Malta, [name] Taking Active Self  atorvastatin (LIPITOR) 80 MG tablet [id number] Yes Take 1 tablet (80 mg total) by mouth every evening. [place], Anderson Malta, [name] Taking Active   carvedilol (COREG) 6.25 MG tablet [id number] Yes Take 6.25 mg by mouth 2 (two) times daily with a meal. [provider] Taking Active   clopidogrel (PLAVIX) 75 MG tablet [id number] Yes Take 1 tablet (75 mg total) by mouth daily. Laurey Morale, MD Taking Active   empagliflozin (JARDIANCE) 10 MG TABS tablet [id number] Yes Take 10 mg by mouth daily. [provider] Taking Active Self   ezetimibe (ZETIA) 10 MG tablet [id number] Yes Take 1 tablet (10 mg total) by mouth daily. Laurey Morale, MD Taking Active   famotidine (PEPCID) 10 MG tablet [id number] Yes Take 10 mg by mouth at bedtime. As needed [provider] Taking Active Self  fenofibrate (TRICOR) 48 MG tablet [id number] Yes Take 1 tablet (48 mg total) by mouth daily. [place], Anderson Malta, [name] Taking Active Self  furosemide (LASIX) 20 MG tablet [id number] Yes Take 1 tablet (20 mg total) by mouth daily as needed. For weight gain of 3 lb in 24 hours or 5 lbs in one week Ngetich, Dinah C, NP Taking Active   gabapentin (NEURONTIN) 100 MG capsule [id number] Yes Take 1 capsule (100 mg total) by mouth 3 (three) times daily as needed. Poggi, Herb Grays, PA-C Taking Active   isosorbide mononitrate (IMDUR) 60 MG 24 hr tablet [id number] Yes Take 1 tablet (60 mg total) by mouth daily. Laurey Morale, MD Taking Active   nitroGLYCERIN (NITROSTAT) 0.4 MG SL tablet [id number] Yes Place 1 tablet (0.4 mg total) under the tongue every 5 (five) minutes as needed for chest pain. Ngetich, Donalee Citrin, NP Taking Active Self  ondansetron (ZOFRAN) 4 MG tablet [id number] Yes Take 1 tablet (4 mg total) by mouth every 8 (eight) hours as needed for nausea or vomiting. Ngetich, Dinah C, NP Taking Active   pantoprazole (PROTONIX) 40 MG tablet [id number] Yes Take 1 tablet (40 mg total) by mouth daily. Laurey Morale, MD Taking Active   ranolazine (RANEXA) 500 MG 12 hr tablet [id number] Yes Take 500 mg by mouth 2 (two) times daily. [provider] Taking Active   sacubitril-valsartan (ENTRESTO) 24-26 [place] [id number] Yes  Take 1 tablet by mouth 2 (two) times daily. Laurey Morale, MD Taking Active   sodium chloride flush (NS) 0.9 % injection 3 mL [id number]   Laurey Morale, MD  Active   spironolactone (ALDACTONE) 25 MG tablet [id number] Yes Take 0.5 tablets (12.5 mg total) by mouth daily. Ngetich, Donalee Citrin, NP Taking Active             Home Care and  Equipment/Supplies: Were Home Health Services Ordered?: No Any new equipment or medical supplies ordered?: No  Functional Questionnaire: Do you need assistance with bathing/showering or dressing?: No Do you need assistance with meal preparation?: No Do you need assistance with eating?: No Do you have difficulty maintaining continence: No Do you need assistance with getting out of bed/getting out of a chair/moving?: No Do you have difficulty managing or taking your medications?: No  Follow up appointments reviewed: PCP Follow-up appointment confirmed?: Yes Date of PCP follow-up appointment?: 01/04/23 [facility] Follow-up appointment confirmed?: No Do you need transportation to your follow-up appointment?: No Do you understand care options if your condition(s) worsen?: Yes-patient verbalized understanding    SIGNATURE: .D/RMA

## 2022-12-30 ENCOUNTER — Encounter: Payer: Medicaid Other | Admitting: *Deleted

## 2022-12-30 ENCOUNTER — Other Ambulatory Visit (HOSPITAL_COMMUNITY): Payer: Self-pay

## 2022-12-30 DIAGNOSIS — I5042 Chronic combined systolic (congestive) and diastolic (congestive) heart failure: Secondary | ICD-10-CM

## 2022-12-30 NOTE — Progress Notes (Signed)
Pulmonary Individual Treatment Plan  Patient Details  Name: Clarence [name] MRN: [id number] Date of Birth: [birth year] Referring Provider:   Flowsheet Row Pulmonary Rehab from 07/30/2022 in [facility] Cardiac and Pulmonary Rehab  Referring Provider Dr. Yvonne Kendall, MD       Initial Encounter Date:  Flowsheet Row Pulmonary Rehab from 07/30/2022 in [facility] Cardiac and Pulmonary Rehab  Date 07/30/22       Visit Diagnosis: Chronic combined systolic and diastolic congestive heart failure (HCC)  Patient's Home Medications on Admission:  Current Outpatient Medications:    acetaminophen (TYLENOL) 325 MG tablet, Take 650 mg by mouth at bedtime., Disp: , Rfl:    aspirin EC 81 MG tablet, Take 1 tablet (81 mg total) by mouth daily. Swallow whole., Disp: 30 tablet, Rfl: 4   atorvastatin (LIPITOR) 80 MG tablet, Take 1 tablet (80 mg total) by mouth every evening., Disp: 90 tablet, Rfl: 3   carvedilol (COREG) 6.25 MG tablet, Take 6.25 mg by mouth 2 (two) times daily with a meal., Disp: , Rfl:    clopidogrel (PLAVIX) 75 MG tablet, Take 1 tablet (75 mg total) by mouth daily., Disp: 30 tablet, Rfl: 3   empagliflozin (JARDIANCE) 10 MG TABS tablet, Take 10 mg by mouth daily., Disp: , Rfl:    ezetimibe (ZETIA) 10 MG tablet, Take 1 tablet (10 mg total) by mouth daily., Disp: 90 tablet, Rfl: 3   famotidine (PEPCID) 10 MG tablet, Take 10 mg by mouth at bedtime. As needed, Disp: , Rfl:    fenofibrate (TRICOR) 48 MG tablet, Take 1 tablet (48 mg total) by mouth daily., Disp: 30 tablet, Rfl: 8   furosemide (LASIX) 20 MG tablet, Take 1 tablet (20 mg total) by mouth daily as needed. For weight gain of 3 lb in 24 hours or 5 lbs in one week, Disp: 90 tablet, Rfl: 1   gabapentin (NEURONTIN) 100 MG capsule, Take 1 capsule (100 mg total) by mouth 3 (three) times daily as needed., Disp: 10 capsule, Rfl: 0   isosorbide mononitrate (IMDUR) 60 MG 24 hr tablet, Take 1 tablet (60 mg total) by mouth daily., Disp: 90 tablet, Rfl:  3   nitroGLYCERIN (NITROSTAT) 0.4 MG SL tablet, Place 1 tablet (0.4 mg total) under the tongue every 5 (five) minutes as needed for chest pain., Disp: 25 tablet, Rfl: 3   ondansetron (ZOFRAN) 4 MG tablet, Take 1 tablet (4 mg total) by mouth every 8 (eight) hours as needed for nausea or vomiting., Disp: 20 tablet, Rfl: 0   pantoprazole (PROTONIX) 40 MG tablet, Take 1 tablet (40 mg total) by mouth daily., Disp: 30 tablet, Rfl: 11   ranolazine (RANEXA) 500 MG 12 hr tablet, Take 500 mg by mouth 2 (two) times daily., Disp: , Rfl:    sacubitril-valsartan (ENTRESTO) 24-26 MG, Take 1 tablet by mouth 2 (two) times daily., Disp: 180 tablet, Rfl: 1   spironolactone (ALDACTONE) 25 MG tablet, Take 0.5 tablets (12.5 mg total) by mouth daily., Disp: 45 tablet, Rfl: 1  Current Facility-Administered Medications:    sodium chloride flush (NS) 0.9 % injection 3 mL, 3 mL, Intravenous, Q12H, Laurey Morale, MD  Past Medical History: Past Medical History:  Diagnosis Date   Ascending Aortic Dilation    a. 09/2021 Echo: Asc Ao 40mm.   CAD (coronary artery disease)    a. 07/2021 Cath: LM nl, LAD 43m, D2 40, LCX 65p/m, OM1 90, RCA 11m w/ L-.R collats to RPDA from septal 1/2-->Med rx.   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 07/2021 Echo: EF 30-35%, glob HK, mod LVH, GrII DD, nl RV fxn, mildly dil LA, mild-mod MR; b. 09/2021 Echo: EF 35-40%, glob HK, mod LVH, GrI DD, nl RV fxn, mildly dil RA, mild MR, Asc Ao 40mm.   CKD (chronic kidney disease) stage 2, GFR 60-89 ml/min    Crohn's disease (HCC)    Hypertension    Mixed Ischemic & Nonischemic cardiomyopathy    a. 07/2021 Echo: EF 30-35%; b. 07/2021 Cath: Occluded RCA w/ mod LAD/LCX dzs, and severe OM1 dzs-->Med Rx; c. 09/2021 Echo: EF 35-40%.   PAH (pulmonary artery hypertension) (HCC)    a. 07/2021 RHC: PA 67/30 (42).   Proteinuria     Tobacco Use: Social History   Tobacco Use  Smoking Status Former   Types: Cigars   Quit date:  08/13/2021   Years since quitting: 1.3  Smokeless Tobacco Not on file    Labs: Review Flowsheet  More data exists      Latest Ref Rng & Units 10/07/2021 01/27/2022 07/15/2022 09/21/2022 09/29/2022  Labs for ITP Cardiac and Pulmonary Rehab  Cholestrol 0 - 200 mg/dL 409  811  914  782  -  LDL (calc) 0 - 99 mg/dL 43  70  81  59  -  HDL-C >40 mg/dL 30  35  39  33  -  Trlycerides <150 mg/dL 956  213  086  578  -  Bicarbonate 20.0 - 28.0 mmol/L - - - - 22.9  23.5   TCO2 22 - 32 mmol/L - - - - 24  25   Acid-base deficit 0.0 - 2.0 mmol/L - - - - 3.0  3.0   O2 Saturation % - - - - 71  68     Details       Multiple values from one day are sorted in reverse-chronological order          Pulmonary Assessment Scores:  Pulmonary Assessment Scores     Row Name 07/28/22 1459 07/30/22 1109 12/17/22 1741     ADL UCSD   ADL Phase Entry Entry Exit   SOB Score total 54 54 --   Rest 5 5 --   Walk 3 3 --   Stairs 3 3 --   Bath 3 3 --   Dress 2 2 --   Shop 3 3 --     CAT Score   CAT Score 22 22 --     mMRC Score   mMRC Score -- 2 3            UCSD: Self-administered rating of dyspnea associated with activities of daily living (ADLs) 6-point scale (0 = "not at all" to 5 = "maximal or unable to do because of breathlessness")  Scoring Scores range from 0 to 120.  Minimally important difference is 5 units  CAT: CAT can identify the health impairment of COPD patients and is better correlated with disease progression.  CAT has a scoring range of zero to 40. The CAT score is classified into four groups of low (less than 10), medium (10 - 20), high (21-30) and very high (31-40) based on the impact level of disease on health status. A CAT score over 10 suggests significant symptoms.  A worsening CAT score could be explained by an exacerbation, poor medication adherence, poor inhaler technique, or progression of COPD or comorbid conditions.  CAT MCID is 2 points  mMRC: mMRC (Modified Medical  Research Council) Dyspnea Scale is used to assess the degree of baseline functional disability in patients of respiratory disease due to dyspnea. No minimal important difference is established. A decrease in score of 1 point or greater is considered a positive change.   Pulmonary Function Assessment:   Exercise Target Goals: Exercise Program Goal: Individual exercise prescription set using results from initial 6 min walk test and THRR while considering  patient's activity barriers and safety.   Exercise Prescription Goal: Initial exercise prescription builds to 30-45 minutes a day of aerobic activity, 2-3 days per week.  Home exercise guidelines will be given to patient during program as part of exercise prescription that the participant will acknowledge.  Education: Aerobic Exercise: - Group verbal and visual presentation on the components of exercise prescription. Introduces F.I.T.T principle from ACSM for exercise prescriptions.  Reviews F.I.T.T. principles of aerobic exercise including progression. Written material given at graduation. Flowsheet Row Pulmonary Rehab from 11/11/2022 in [facility] Cardiac and Pulmonary Rehab  Education need identified 07/30/22  Date 08/12/22  Educator KW  Instruction Review Code 1- [employer] Understanding       Education: Resistance Exercise: - Group verbal and visual presentation on the components of exercise prescription. Introduces F.I.T.T principle from ACSM for exercise prescriptions  Reviews F.I.T.T. principles of resistance exercise including progression. Written material given at graduation.    Education: Exercise & Equipment Safety: - Individual verbal instruction and demonstration of equipment use and safety with use of the equipment. Flowsheet Row Pulmonary Rehab from 11/11/2022 in [facility] Cardiac and Pulmonary Rehab  Date 07/30/22  Educator NT  Instruction Review Code 1- Verbalizes Understanding       Education: Exercise Physiology & General  Exercise Guidelines: - Group verbal and written instruction with models to review the exercise physiology of the cardiovascular system and associated critical values. Provides general exercise guidelines with specific guidelines to those with heart or lung disease.  Flowsheet Row Pulmonary Rehab from 11/11/2022 in [facility] Cardiac and Pulmonary Rehab  Date 08/05/22  Educator KW  Instruction Review Code 1- [employer] Understanding       Education: Flexibility, Balance, Mind/Body Relaxation: - Group verbal and visual presentation with interactive activity on the components of exercise prescription. Introduces F.I.T.T principle from ACSM for exercise prescriptions. Reviews F.I.T.T. principles of flexibility and balance exercise training including progression. Also discusses the mind body connection.  Reviews various relaxation techniques to help reduce and manage stress (i.e. Deep breathing, progressive muscle relaxation, and visualization). Balance handout provided to take home. Written material given at graduation.   Activity Barriers & Risk Stratification:  Activity Barriers & Cardiac Risk Stratification - 07/30/22 1111       Activity Barriers & Cardiac Risk Stratification   Activity Barriers Joint Problems;Deconditioning;Shortness of Breath   left shoulder pain            6 Minute Walk:  6 Minute Walk     Row Name 07/30/22 1343 12/17/22 1552       6 Minute Walk   Phase Initial Discharge    Distance 940 feet 900 feet    Distance % Change -- -4.3 %    Distance Feet Change -- -40 ft    Walk Time 6 minutes 6 minutes    # of Rest Breaks 0 0    MPH 1.78 1.7    METS 2.78 2.38    RPE 13 15    Perceived Dyspnea  2 3    VO2 Peak 9.73 8.31    Symptoms  Yes (comment) Yes (comment)    Comments SOB Leg cramps and nausea    Resting HR 61 bpm 87 bpm    Resting BP 104/62 90/60    Resting Oxygen Saturation  94 % 97 %    Exercise Oxygen Saturation  during 6 min walk 94 % 95 %    Max Ex. HR  90 bpm 97 bpm    Max Ex. BP 124/72 110/62    2 Minute Post BP 100/64 90/60      Interval HR   1 Minute HR 83 95    2 Minute HR 88 94    3 Minute HR 86 88    4 Minute HR 86 97    5 Minute HR 90 94    6 Minute HR 84 92    2 Minute Post HR 61 80    Interval Heart Rate? Yes Yes      Interval Oxygen   Interval Oxygen? Yes Yes    Baseline Oxygen Saturation % 94 % 97 %    1 Minute Oxygen Saturation % 94 % 95 %    1 Minute Liters of Oxygen 0 L  RA 0 L    2 Minute Oxygen Saturation % -- 95 %    2 Minute Liters of Oxygen 0 L  RA 0 L    3 Minute Oxygen Saturation % 94 % 96 %    3 Minute Liters of Oxygen 0 L  RA 0 L    4 Minute Oxygen Saturation % 94 % 96 %    4 Minute Liters of Oxygen 0 L  RA 0 L    5 Minute Oxygen Saturation % 94 % 96 %    5 Minute Liters of Oxygen 0 L  RA 0 L    6 Minute Oxygen Saturation % 94 % 96 %    6 Minute Liters of Oxygen 0 L  RA 0 L    2 Minute Post Oxygen Saturation % 96 % 97 %    2 Minute Post Liters of Oxygen 0 L  RA 0 L            Oxygen Initial Assessment:  Oxygen Initial Assessment - 07/28/22 1416       Home Oxygen   Home Oxygen Device None    Sleep Oxygen Prescription None    Home Exercise Oxygen Prescription None    Home Resting Oxygen Prescription None    Compliance with Home Oxygen Use Yes      Intervention   Short Term Goals To learn and understand importance of maintaining oxygen saturations>88%;To learn and demonstrate proper pursed lip breathing techniques or other breathing techniques. ;To learn and understand importance of monitoring SPO2 with pulse oximeter and demonstrate accurate use of the pulse oximeter.    Long  Term Goals Maintenance of O2 saturations>88%;Compliance with respiratory medication;Verbalizes importance of monitoring SPO2 with pulse oximeter and return demonstration;Exhibits proper breathing techniques, such as pursed lip breathing or other method taught during program session             Oxygen  Re-Evaluation:  Oxygen Re-Evaluation     Row Name 08/03/22 1540 08/17/22 1559 11/16/22 1550 11/30/22 1557       Program Oxygen Prescription   Program Oxygen Prescription -- None None None      Home Oxygen   Home Oxygen Device -- None None None    Sleep Oxygen Prescription -- None None None    Home Exercise Oxygen  Prescription -- None None None    Home Resting Oxygen Prescription -- None None None    Compliance with Home Oxygen Use -- -- -- Yes      Goals/Expected Outcomes   Short Term Goals -- To learn and demonstrate proper pursed lip breathing techniques or other breathing techniques.  To learn and understand importance of maintaining oxygen saturations>88%;To learn and understand importance of monitoring SPO2 with pulse oximeter and demonstrate accurate use of the pulse oximeter. To learn and understand importance of maintaining oxygen saturations>88%;To learn and understand importance of monitoring SPO2 with pulse oximeter and demonstrate accurate use of the pulse oximeter.    Long  Term Goals -- Exhibits proper breathing techniques, such as pursed lip breathing or other method taught during program session Verbalizes importance of monitoring SPO2 with pulse oximeter and return demonstration;Maintenance of O2 saturations>88% Verbalizes importance of monitoring SPO2 with pulse oximeter and return demonstration;Maintenance of O2 saturations>88%    Comments Reviewed PLB technique with pt.  Talked about how it works and it's importance in maintaining their exercise saturations. Informed patient how to perform the Pursed Lipped breathing technique. Told patient to Inhale through the nose and out the mouth with pursed lips to keep their airways open, help oxygenate them better, practice when at rest or doing strenuous activity. Patient Verbalizes understanding of technique and will work on and be reiterated during [facility]. He does not have a pulse oximeter to check her/his oxygen saturation at  home. Informed him where to get one and explained why it is important to have one. Reviewed that oxygen saturations should be 88 percent and above. Patient verbalizes understanding. He does not have a pulse oximeter to check his oxygen saturation at home. Informed him where to get one and explained why it is important to have one. Reviewed that oxygen saturations should be 88 percent and above. Patient verbalizes understanding. His current pulse oximeter did not work right and is going to go to [employer] to get one. He is going to bring it in after he purchases one.    Goals/Expected Outcomes Short: Become more profiecient at using PLB.   Long: Become independent at using PLB. Short: use PLB with exertion. Long: use PLB on exertion proficiently and independently. Short: monitor oxygen at home with exertion. Long: maintain oxygen saturations above 88 percent independently. Short: obtain a pulse oximeter and monitor oxygen at home with exertion. Long: maintain oxygen saturations above 88 percent independently.             Oxygen Discharge (Final Oxygen Re-Evaluation):  Oxygen Re-Evaluation - 11/30/22 1557       Program Oxygen Prescription   Program Oxygen Prescription None      Home Oxygen   Home Oxygen Device None    Sleep Oxygen Prescription None    Home Exercise Oxygen Prescription None    Home Resting Oxygen Prescription None    Compliance with Home Oxygen Use Yes      Goals/Expected Outcomes   Short Term Goals To learn and understand importance of maintaining oxygen saturations>88%;To learn and understand importance of monitoring SPO2 with pulse oximeter and demonstrate accurate use of the pulse oximeter.    Long  Term Goals Verbalizes importance of monitoring SPO2 with pulse oximeter and return demonstration;Maintenance of O2 saturations>88%    Comments He does not have a pulse oximeter to check his oxygen saturation at home. Informed him where to get one and explained why it is important  to have one. Reviewed that  oxygen saturations should be 88 percent and above. Patient verbalizes understanding. His current pulse oximeter did not work right and is going to go to [employer] to get one. He is going to bring it in after he purchases one.    Goals/Expected Outcomes Short: obtain a pulse oximeter and monitor oxygen at home with exertion. Long: maintain oxygen saturations above 88 percent independently.             Initial Exercise Prescription:  Initial Exercise Prescription - 07/30/22 1400       Date of Initial Exercise RX and Referring Provider   Date 07/30/22    Referring Provider Dr. Yvonne Kendall, MD      Oxygen   Maintain Oxygen Saturation 88% or higher      Treadmill   MPH 1.8    Grade 0.5    Minutes 15    METs 2.5      Recumbant Bike   Level 1    RPM 50    Watts 25    Minutes 15    METs 2.78      NuStep   Level 2    SPM 80    Minutes 15    METs 2.78      REL-XR   Level 2    Speed 50    Minutes 15    METs 2.78      Prescription Details   Frequency (times per week) 3    Duration Progress to 30 minutes of continuous aerobic without signs/symptoms of physical distress      Intensity   THRR 40-80% of Max Heartrate 103-145    Ratings of Perceived Exertion 11-13    Perceived Dyspnea 0-4      Progression   Progression Continue to progress workloads to maintain intensity without signs/symptoms of physical distress.      Resistance Training   Training Prescription Yes    Weight 4 lb    Reps 10-15             Perform Capillary Blood Glucose checks as needed.  Exercise Prescription Changes:   Exercise Prescription Changes     Row Name 07/30/22 1400 08/12/22 1400 08/27/22 1400 09/01/22 0800 09/09/22 1300     Response to Exercise   Blood Pressure (Admit) 104/62 102/60 110/68 -- 110/60   Blood Pressure (Exercise) 124/72 122/68 148/78 -- --   Blood Pressure (Exit) 100/64 98/60 124/78 -- 92/64   Heart Rate (Admit) 61 bpm 78 bpm 63  bpm -- 69 bpm   Heart Rate (Exercise) 90 bpm 102 bpm 94 bpm -- 86 bpm   Heart Rate (Exit) 61 bpm 83 bpm 76 bpm -- 73 bpm   Oxygen Saturation (Admit) 94 % 97 % 97 % -- 97 %   Oxygen Saturation (Exercise) 94 % 95 % 93 % -- 93 %   Oxygen Saturation (Exit) 96 % 95 % 95 % -- 94 %   Rating of Perceived Exertion (Exercise) 13 15 15  -- 15   Perceived Dyspnea (Exercise) 2 2 3  -- 2   Symptoms SOB SOB SOB -- SOB   Comments Results 3rd full day of exercise -- -- --   Duration -- Progress to 30 minutes of  aerobic without signs/symptoms of physical distress Continue with 30 min of aerobic exercise without signs/symptoms of physical distress. -- Continue with 30 min of aerobic exercise without signs/symptoms of physical distress.   Intensity -- THRR unchanged THRR unchanged -- THRR unchanged  Progression   Progression -- Continue to progress workloads to maintain intensity without signs/symptoms of physical distress. Continue to progress workloads to maintain intensity without signs/symptoms of physical distress. -- Continue to progress workloads to maintain intensity without signs/symptoms of physical distress.   Average METs -- 2.6 2.76 -- 2.33     Resistance Training   Training Prescription -- Yes Yes -- Yes   Weight -- 4 lb 4 lb -- 5 lb   Reps -- 10-15 10-15 -- 10-15     Interval Training   Interval Training -- No No -- No     Treadmill   MPH -- 1.8 1.9 -- 1.9   Grade -- 2 1.5 -- 0   Minutes -- 15 15 -- 15   METs -- 2.87 2.85 -- 2.45     NuStep   Level -- 2 2 -- --   Minutes -- 15 15 -- --   METs -- 2.5 2.3 -- --     Recumbant Elliptical   Level -- -- 1 -- --   Minutes -- -- 15 -- --   METs -- -- 1.4 -- --     Elliptical   Level -- -- -- -- 1   Minutes -- -- -- -- 15     REL-XR   Level -- 2 3 -- 4   Minutes -- 15 15 -- 15   METs -- 2.6 3.7 -- 2.6     T5 Nustep   Level -- 1 4 -- --   Minutes -- 15 15 -- --   METs -- 2.3 2.2 -- --     Home Exercise Plan   Plans to  continue exercise at -- -- -- Home (comment)  walking Home (comment)  walking   Frequency -- -- -- Add 2 additional days to program exercise sessions. Add 2 additional days to program exercise sessions.   Initial Home Exercises Provided -- -- -- 08/31/22 08/31/22     Oxygen   Maintain Oxygen Saturation -- 88% or higher 88% or higher -- 88% or higher    Row Name 09/22/22 1500 11/18/22 0800 12/04/22 0800 12/16/22 1000       Response to Exercise   Blood Pressure (Admit) 82/58 100/60 102/60 112/70    Blood Pressure (Exit) 84/60 106/60 104/62 104/58    Heart Rate (Admit) 64 bpm 82 bpm 99 bpm 58 bpm    Heart Rate (Exercise) 76 bpm 85 bpm 104 bpm 89 bpm    Heart Rate (Exit) 67 bpm 68 bpm 99 bpm 60 bpm    Oxygen Saturation (Admit) 96 % 92 % 97 % 96 %    Oxygen Saturation (Exercise) 95 % 92 % 93 % 94 %    Oxygen Saturation (Exit) 96 % 96 % 96 % 96 %    Rating of Perceived Exertion (Exercise) 12 15 16 15     Perceived Dyspnea (Exercise) -- 3 2 3     Symptoms SOB SOB SOB SOB    Duration Continue with 30 min of aerobic exercise without signs/symptoms of physical distress. Continue with 30 min of aerobic exercise without signs/symptoms of physical distress. Continue with 30 min of aerobic exercise without signs/symptoms of physical distress. Continue with 30 min of aerobic exercise without signs/symptoms of physical distress.    Intensity THRR unchanged THRR unchanged THRR unchanged THRR unchanged      Progression   Progression Continue to progress workloads to maintain intensity without signs/symptoms of physical distress. Continue to progress workloads  to maintain intensity without signs/symptoms of physical distress. Continue to progress workloads to maintain intensity without signs/symptoms of physical distress. Continue to progress workloads to maintain intensity without signs/symptoms of physical distress.    Average METs 1.8 1.87 2.3 2.3      Resistance Training   Training Prescription Yes Yes  Yes Yes    Weight 5 lb 5 lb 5 lb 5 lb    Reps 10-15 10-15 10-15 10-15      Interval Training   Interval Training No No No No      Treadmill   MPH -- -- 1.9 1.6    Grade -- -- 0 0    Minutes -- -- 15 15    METs -- -- 2.45 2.23      Recumbant Bike   Level -- 7 9 12     Watts -- 34 30 46    Minutes -- 15 15 15     METs -- -- 2.75 2.74      NuStep   Level -- 6 6 5     Minutes -- 30 15 30     METs -- 2 2.1 2.1      REL-XR   Level -- -- 6 7    Minutes -- -- 15 15    METs -- -- 2.7 3      T5 Nustep   Level 4 -- 4 --    Minutes 30 -- 15 --    METs 1.8 -- 1.8 --      Home Exercise Plan   Plans to continue exercise at Home (comment)  walking Home (comment)  walking Home (comment)  walking Home (comment)  walking    Frequency Add 2 additional days to program exercise sessions. Add 2 additional days to program exercise sessions. Add 2 additional days to program exercise sessions. Add 2 additional days to program exercise sessions.    Initial Home Exercises Provided 08/31/22 08/31/22 08/31/22 08/31/22      Oxygen   Maintain Oxygen Saturation 88% or higher 88% or higher 88% or higher 88% or higher             Exercise Comments:   Exercise Comments     Row Name 08/03/22 1539           Exercise Comments First full day of exercise!  Patient was oriented to gym and equipment including functions, settings, policies, and procedures.  Patient's individual exercise prescription and treatment plan were reviewed.  All starting workloads were established based on the results of the 6 minute walk test done at initial orientation visit.  The plan for exercise progression was also introduced and progression will be customized based on patient's performance and goals.                Exercise Goals and Review:   Exercise Goals     Row Name 07/30/22 1112             Exercise Goals   Increase Physical Activity Yes       Intervention Provide advice, education, support and  counseling about physical activity/exercise needs.;Develop an individualized exercise prescription for aerobic and resistive training based on initial evaluation findings, risk stratification, comorbidities and participant's personal goals.       Expected Outcomes Short Term: Attend rehab on a regular basis to increase amount of physical activity.;Long Term: Add in home exercise to make exercise part of routine and to increase amount of physical activity.;Long Term: Exercising regularly at least 3-5  days a week.       Increase Strength and Stamina Yes       Intervention Provide advice, education, support and counseling about physical activity/exercise needs.;Develop an individualized exercise prescription for aerobic and resistive training based on initial evaluation findings, risk stratification, comorbidities and participant's personal goals.       Expected Outcomes Short Term: Increase workloads from initial exercise prescription for resistance, speed, and METs.;Long Term: Improve cardiorespiratory fitness, muscular endurance and strength as measured by increased METs and functional capacity ( );Short Term: Perform resistance training exercises routinely during rehab and add in resistance training at home       Able to understand and use rate of perceived exertion (RPE) scale Yes       Intervention Provide education and explanation on how to use RPE scale       Expected Outcomes Short Term: Able to use RPE daily in rehab to express subjective intensity level;Long Term:  Able to use RPE to guide intensity level when exercising independently       Able to understand and use Dyspnea scale Yes       Intervention Provide education and explanation on how to use Dyspnea scale       Expected Outcomes Short Term: Able to use Dyspnea scale daily in rehab to express subjective sense of shortness of breath during exertion;Long Term: Able to use Dyspnea scale to guide intensity level when exercising independently        Knowledge and understanding of Target Heart Rate Range (THRR) Yes       Intervention Provide education and explanation of THRR including how the numbers were predicted and where they are located for reference       Expected Outcomes Short Term: Able to state/look up THRR;Long Term: Able to use THRR to govern intensity when exercising independently;Short Term: Able to use daily as guideline for intensity in rehab       Able to check pulse independently Yes       Intervention Provide education and demonstration on how to check pulse in carotid and radial arteries.;Review the importance of being able to check your own pulse for safety during independent exercise       Expected Outcomes Short Term: Able to explain why pulse checking is important during independent exercise;Long Term: Able to check pulse independently and accurately       Understanding of Exercise Prescription Yes       Intervention Provide education, explanation, and written materials on patient's individual exercise prescription       Expected Outcomes Short Term: Able to explain program exercise prescription;Long Term: Able to explain home exercise prescription to exercise independently                Exercise Goals Re-Evaluation :  Exercise Goals Re-Evaluation     Row Name 08/03/22 1539 08/12/22 1426 08/27/22 1446 09/01/22 0822 09/09/22 1359     Exercise Goal Re-Evaluation   Exercise Goals Review Increase Physical Activity;Able to understand and use rate of perceived exertion (RPE) scale;Knowledge and understanding of Target Heart Rate Range (THRR);Understanding of Exercise Prescription;Increase Strength and Stamina;Able to check pulse independently;Able to understand and use Dyspnea scale Increase Physical Activity;Increase Strength and Stamina;Understanding of Exercise Prescription Increase Physical Activity;Increase Strength and Stamina;Understanding of Exercise Prescription Increase Physical Activity;Increase Strength  and Stamina;Understanding of Exercise Prescription;Able to understand and use rate of perceived exertion (RPE) scale;Able to understand and use Dyspnea scale;Knowledge and understanding of Target Heart Rate Range (THRR);Able to  check pulse independently Increase Physical Activity;Increase Strength and Stamina;Understanding of Exercise Prescription   Comments Reviewed RPE scale, THR and program prescription with pt today.  Pt voiced understanding and was given a copy of goals to take home. Clarence [name] is off to a good start with his rehab sessions. He has been able to do his initial exercise prescription and even went up to 2% incline on the treadmill. He is not quite hitting his THR but will continue to monitor that as he progresses in the program. Clarence [name] continues to do well in rehab. He recently increased his overall average MET level to 2.76 METs. He also improved to level 4 on the T5 nustep and level 3 on the XR. He began using the recumbent elliptical at level 1 as well. He also increased his treadmill speed to 1.9 mph while maintaining an incline of 1.5%. We will continue to monitor his progress in the program. Reviewed home exercise with pt today.  Pt plans to walk at home for exercise.  He is going to build up to 30 min.  Reviewed THR, pulse, RPE, sign and symptoms, pulse oximetery and when to call 911 or MD.  Also discussed weather considerations and indoor options.  Pt voiced understanding. Clarence [name] continues to do well in rehab. He did take some incline off of his treadmill. Some days he experiences low BP and nausea and accommodates workloads based off of that. He did try out the elliptical one day and was able to do level 1. He started using 5 lb handweights as well! We will continue to monitor.   Expected Outcomes Short: Use RPE daily to regulate intensity.  Long: Follow program prescription in THR. Short: Continue initial exercise prescription Long: Increase overall strength and stamina Short: Continue to  progressively increase treadmill workload. Long: Continue to improve strength and stamina. Short: Start to build up time walking at home Long; conitnue to exercise indpendently Short: Slowly add incline back to treadmill when appropriate Long: Continue to increase overall MET level and stamina    Row Name 09/22/22 1503 10/06/22 1549 10/22/22 1511 11/05/22 1636 11/18/22 0848     Exercise Goal Re-Evaluation   Exercise Goals Review Increase Physical Activity;Increase Strength and Stamina;Understanding of Exercise Prescription Increase Physical Activity;Increase Strength and Stamina;Understanding of Exercise Prescription Increase Physical Activity;Increase Strength and Stamina;Understanding of Exercise Prescription Increase Physical Activity;Increase Strength and Stamina;Understanding of Exercise Prescription Increase Physical Activity;Increase Strength and Stamina;Understanding of Exercise Prescription   Comments Clarence [name] has only attended rehab once since the last review due to issues with low BP and chest pain. During his one session he was able to work at level 4 on the T5 nustep for 30 minutes. He has a heart cath scheduled for 09/29/2022. He plans to return after this procedure. We will continue to monitor his progress when he returns to the program. Clarence [name] has been placed on a medical hold as he has been getting evaluated for symptoms with testing including a heart cath. RN spoke with patient yesterday who advised patient to speak with his heart failure doctor at his f/u appt on 5/29 to seek clearance to return to rehab. Per patient, he has still been experiencing symptoms and therefore we need his MD to state whether or not he is cleared to exercise. We recently called to check on patient. He has been out on medical hold with chest pain and heart failure work up. He has not attended since 09/07/22 and will need clearance to return. We will continue  to monitor his progress when he returns to the program. We  recently called to check on patient. He has been out on medical hold with chest pain and heart failure work up. He has not attended since 09/07/22 and will need clearance to return. We will continue to monitor his progress when he returns to the program. Clarence [name] returned to rehab for two sessions after being out since 09/07/22. During his two sessions he was able to improve to level 6 on the T4 nustep and level 7 on the recumbent bike. He also continues to use 5 lb hand weights for resistance training. We will continue to monitor his progress in the program.   Expected Outcomes Short: Return to rehab when appropriate. Long: Continue to improve strength and stamina. Short: Talk to doctor and receive clearance to exercise Long: Graduate from [employer] Short: Talk to doctor and receive clearance to exercise. Long: Graduate from [employer]. Short: Talk to doctor and receive clearance to exercise. Long: Graduate from [employer]. Short: Return to walking in rehab. Long: Continue to improve strength and stamina.    Row Name 12/04/22 6644 12/16/22 1027           Exercise Goal Re-Evaluation   Exercise Goals Review Increase Physical Activity;Increase Strength and Stamina;Understanding of Exercise Prescription Increase Physical Activity;Increase Strength and Stamina;Understanding of Exercise Prescription      Comments Clarence [name] is doing well in rehab. He recently improved to level 9 on the recumbent bike and level 6 on the XR. He also continues to walk on the treadmill at a speed of 1.9 mph with no incline. We will continue to monitor his progress in the program. Clarence [name] continues to do well in rehab. He is due for his post and will look to improve on it.  He recently improved to level 12 on the recumbent bike and level 7 on the XR. His treadmill speed did decline since the last review, going from 1.9 mph down to 1.6 mph. We will continue to monitor his progress in the program.      Expected Outcomes Short: Add incline  to treadmill workload. Long: Continue exercise to improve strength and stamina. Short: Improve on post . Long: Continue exercise to improve strength and stamina.               Discharge Exercise Prescription (Final Exercise Prescription Changes):  Exercise Prescription Changes - 12/16/22 1000       Response to Exercise   Blood Pressure (Admit) 112/70    Blood Pressure (Exit) 104/58    Heart Rate (Admit) 58 bpm    Heart Rate (Exercise) 89 bpm    Heart Rate (Exit) 60 bpm    Oxygen Saturation (Admit) 96 %    Oxygen Saturation (Exercise) 94 %    Oxygen Saturation (Exit) 96 %    Rating of Perceived Exertion (Exercise) 15    Perceived Dyspnea (Exercise) 3    Symptoms SOB    Duration Continue with 30 min of aerobic exercise without signs/symptoms of physical distress.    Intensity THRR unchanged      Progression   Progression Continue to progress workloads to maintain intensity without signs/symptoms of physical distress.    Average METs 2.3      Resistance Training   Training Prescription Yes    Weight 5 lb    Reps 10-15      Interval Training   Interval Training No      Treadmill   MPH 1.6  Grade 0    Minutes 15    METs 2.23      Recumbant Bike   Level 12    Watts 46    Minutes 15    METs 2.74      NuStep   Level 5    Minutes 30    METs 2.1      REL-XR   Level 7    Minutes 15    METs 3      Home Exercise Plan   Plans to continue exercise at Home (comment)   walking   Frequency Add 2 additional days to program exercise sessions.    Initial Home Exercises Provided 08/31/22      Oxygen   Maintain Oxygen Saturation 88% or higher             Nutrition:  Target Goals: Understanding of nutrition guidelines, daily intake of sodium 1500mg , cholesterol 200mg , calories 30% from fat and 7% or less from saturated fats, daily to have 5 or more servings of fruits and vegetables.  Education: All About Nutrition: -Group instruction provided by verbal,  written material, interactive activities, discussions, models, and posters to present general guidelines for heart healthy nutrition including fat, fiber, MyPlate, the role of sodium in heart healthy nutrition, utilization of the nutrition label, and utilization of this knowledge for meal planning. Follow up email sent as well. Written material given at graduation. Flowsheet Row Pulmonary Rehab from 11/11/2022 in [facility]  Education need identified 07/30/22  Date 09/02/22  [part 2]  Educator KW  Instruction Review Code 1- Verbalizes Understanding       Biometrics:  Pre Biometrics - 07/30/22 1112       Pre Biometrics   Height 5' 9.75" (1.772 m)    Weight 225 lb 9.6 oz (102.3 kg)    Waist Circumference 43 inches    Hip Circumference 44 inches    Waist to Hip Ratio 0.98 %    BMI (Calculated) 32.59    Single Leg Stand 30 seconds             Post Biometrics - 12/17/22 1556        Post  Biometrics   Height 5' 9.76" (1.772 m)    Weight 235 lb (106.6 kg)    BMI (Calculated) 33.95    Single Leg Stand 17 seconds             Nutrition Therapy Plan and Nutrition Goals:  Nutrition Therapy & Goals - 07/30/22 1038       Nutrition Therapy   Diet Heart healthy, low Na    Drug/Food Interactions Statins/Certain Fruits    Protein (specify units) 80-85g    Fiber 35 grams    Whole Grain Foods 3 servings    Saturated Fats 16 max. grams    Fruits and Vegetables 8 servings/day    Sodium 2 grams      Personal Nutrition Goals   Nutrition Goal ST: practice MyPlate guidelines, practice reading food labels  LT: limit Na <2g/day, limit saturated fat <16g/day, aim for 30-35g of fiber per day    Comments 52 y.o. M admitted to pulmonary rehab for Chronic combined systolic and diastolic congestive heart failure. PMHx includes CAD, CKD stg 2, crohn's disease, HTN, pulmonary artery hypertension. Reviewed relevant medications atorvastatin, jardiance, zetia, pepcid,  furosemide. Reviewed most recent labs. Clarence [name] that his Crohns has been well controlled and his main trigger foods are certain types of processed meats; eating  smaller/more frequent meals help to manage his symptoms as well. Yeshayahu [name] limiting chinese food, pizza, and fried foods. He has been trying to use a grill for his cooking to avoid lots of added fat or grease. He has been trying to cut back on soda and now only has it occassionally. Discussed food patterns and heart healthy eating as well as MyPlate. Encouagred him to read labels, choose liquid cooking oils, and to include protein, healthy fat, and fiber at meals to make them more nutrient dense and satiating. Discussed CKD stg 2 MNT and how a modest protei is recommeneded (0.8 g/kg/d) - encouraged to be mindful of animal protein portions and to include plant protein.      Intervention Plan   Intervention Prescribe, educate and counsel regarding individualized specific dietary modifications aiming towards targeted core components such as weight, hypertension, lipid management, diabetes, heart failure and other comorbidities.;Nutrition handout(s) given to patient.    Expected Outcomes Short Term Goal: Understand basic principles of dietary content, such as calories, fat, sodium, cholesterol and nutrients.;Short Term Goal: A plan has been developed with personal nutrition goals set during dietitian appointment.;Long Term Goal: Adherence to prescribed nutrition plan.             Nutrition Assessments:  MEDIFICTS Score Key: ?70 Need to make dietary changes  40-70 Heart Healthy Diet ? 40 Therapeutic Level Cholesterol Diet  Flowsheet Row Pulmonary Rehab from 07/28/2022 in [facility] Cardiac and Pulmonary Rehab  Picture Your Plate Total Score on Admission 51      Picture Your Plate Scores: <83 Unhealthy dietary pattern with much room for improvement. 41-50 Dietary pattern unlikely to meet recommendations for good health and room for  improvement. 51-60 More healthful dietary pattern, with some room for improvement.  >60 Healthy dietary pattern, although there may be some specific behaviors that could be improved.   Nutrition Goals Re-Evaluation:  Nutrition Goals Re-Evaluation     Row Name 08/17/22 1602 11/16/22 1552 11/30/22 1601         Goals   Current Weight 230 lb (104.3 kg) 229 lb (103.9 kg) 228 lb (103.4 kg)     Nutrition Goal Lose some weight -- --     Comment Patient was informed on why it is important to maintain a balanced diet when dealing with Respiratory issues. Explained that it takes a lot of energy to breath and when they are short of breath often they will need to have a good diet to help keep up with the calories they are expending for breathing. Patient defers RD appointment. Patient defers RD appointment.     Expected Outcome Short: Choose and plan snacks accordingly to patients caloric intake to improve breathing. Long: Maintain a diet independently that meets their caloric intake to aid in daily shortness of breath. -- --              Nutrition Goals Discharge (Final Nutrition Goals Re-Evaluation):  Nutrition Goals Re-Evaluation - 11/30/22 1601       Goals   Current Weight 228 lb (103.4 kg)    Comment Patient defers RD appointment.             Psychosocial: Target Goals: Acknowledge presence or absence of significant depression and/or stress, maximize coping skills, provide positive support system. Participant is able to verbalize types and ability to use techniques and skills needed for reducing stress and depression.   Education: Stress, Anxiety, and Depression - Group verbal and visual presentation to define topics covered.  Reviews how body is impacted by stress, anxiety, and depression.  Also discusses healthy ways to reduce stress and to treat/manage anxiety and depression.  Written material given at graduation.   Education: Sleep Hygiene -Provides group verbal and written  instruction about how sleep can affect your health.  Define sleep hygiene, discuss sleep cycles and impact of sleep habits. Review good sleep hygiene tips.    Initial Review & Psychosocial Screening:  Initial Psych Review & Screening - 07/28/22 1349       Initial Review   Current issues with Current Stress Concerns    Source of Stress Concerns Chronic Illness      Family Dynamics   Good Support System? Yes   mother     Barriers   Psychosocial barriers to participate in program There are no identifiable barriers or psychosocial needs.;The patient should benefit from training in stress management and relaxation.      Screening Interventions   Interventions To provide support and resources with identified psychosocial needs;Encouraged to exercise;Provide feedback about the scores to participant    Expected Outcomes Short Term goal: Utilizing psychosocial counselor, staff and physician to assist with identification of specific Stressors or current issues interfering with healing process. Setting desired goal for each stressor or current issue identified.;Long Term Goal: Stressors or current issues are controlled or eliminated.;Short Term goal: Identification and review with participant of any Quality of Life or Depression concerns found by scoring the questionnaire.;Long Term goal: The participant improves quality of Life and PHQ9 Scores as seen by post scores and/or verbalization of changes             Quality of Life Scores:  Scores of 19 and below usually indicate a poorer quality of life in these areas.  A difference of  2-3 points is a clinically meaningful difference.  A difference of 2-3 points in the total score of the Quality of Life Index has been associated with significant improvement in overall quality of life, self-image, physical symptoms, and general health in studies assessing change in quality of life.  PHQ-9: Review Flowsheet  More data exists      09/18/2022 08/17/2022  07/30/2022 07/15/2022 10/29/2021  Depression screen PHQ 2/9  Decreased Interest 0 0 3 0 0  Down, Depressed, Hopeless 0 0 0 0 0  PHQ - 2 Score 0 0 3 0 0  Altered sleeping - 0 3 0 -  Tired, decreased energy - 1 3 0 -  Change in appetite - 3 2 0 -  Feeling bad or failure about yourself  - 0 0 0 -  Trouble concentrating - 0 0 0 -  Moving slowly or fidgety/restless - 0 0 0 -  Suicidal thoughts - 0 0 0 -  PHQ-9 Score - 4 11 0 -  Difficult doing work/chores - Not difficult at all Very difficult Not difficult at all -    Details           Interpretation of Total Score  Total Score Depression Severity:  1-4 = Minimal depression, 5-9 = Mild depression, 10-14 = Moderate depression, 15-19 = Moderately severe depression, 20-27 = Severe depression   Psychosocial Evaluation and Intervention:  Psychosocial Evaluation - 07/28/22 1418       Psychosocial Evaluation & Interventions   Interventions Stress management education;Relaxation education;Encouraged to exercise with the program and follow exercise prescription    Comments Clarence [name] is coming to Pulmonary Rehab for combined heart failure. His most recent EF is 35-40%. He [name]  having two heart attacks in the past. 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 He is living with his mother since his heart attack. He is hesitant to exercise or get back to his normal routine because he is scared about his heart and doesn't want anything to happen to him for his mother's sake. He mentioned that if he goes out, he always makes sure he is close to a hospital in case something happens. He enjoys talking about his job, but doesn't know if he will be able to do all that is required physically. 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 He wants to come to the program to work on his strength and stamina. He also wants more education on what is going on  with his heart and his prognosis.    Expected Outcomes Short: attend pulmonary rehab for education and exercise. Long: develop and maintain positive self care habits.    Continue Psychosocial Services  Follow up required by staff             Psychosocial Re-Evaluation:  Psychosocial Re-Evaluation     Row Name 08/17/22 1604 11/16/22 1557 11/30/22 1601         Psychosocial Re-Evaluation   Current issues with Current Stress Concerns None Identified None Identified     Comments Reviewed patient health questionnaire (PHQ-9) with patient for follow up. Previously, patients score indicated signs/symptoms of depression.  Reviewed to see if patient is improving symptom wise while in program.  Score improved and patient states that it is because he has been able to have more energy. Patient [name] no issues with their current mental states, sleep, stress, depression or anxiety. Will follow up with patient in a few weeks for any changes. Patient [name] no issues with their current mental states, sleep, stress, depression or anxiety. Will follow up with patient in a few weeks for any changes.     Expected Outcomes Short: Continue to attend [facility] regularly for regular exercise and social engagement. Long: Continue to improve symptoms and manage a positive mental state. Short: Continue to exercise regularly to support mental health and notify staff of any changes. Long: maintain mental health and well being through teaching of rehab or prescribed medications independently. Short: Continue to exercise regularly to support mental health and notify staff of any changes. Long: maintain mental health and well being through teaching of rehab or prescribed medications independently.     Interventions -- Encouraged to attend Pulmonary Rehabilitation for the exercise Encouraged to attend Pulmonary Rehabilitation for the exercise     Continue Psychosocial Services  Follow up required by staff Follow up required  by staff Follow up required by staff              Psychosocial Discharge (Final Psychosocial Re-Evaluation):  Psychosocial Re-Evaluation - 11/30/22 1601       Psychosocial Re-Evaluation   Current issues with None Identified    Comments Patient [name] no issues with their current mental states, sleep, stress, depression or anxiety. Will follow up with patient in a few weeks for any changes.    Expected Outcomes Short: Continue to exercise regularly to support mental health and notify staff of any changes. Long: maintain mental health and well being through teaching of rehab or prescribed medications independently.    Interventions Encouraged to attend Pulmonary Rehabilitation for the exercise    Continue Psychosocial Services  Follow up required by staff             Education: Education Goals: Education classes will be provided on a weekly basis, covering required topics. Participant will state understanding/return demonstration of topics presented.  Learning Barriers/Preferences:  Learning Barriers/Preferences - 07/28/22 1335       Learning Barriers/Preferences   Learning Barriers None    Learning Preferences None             General Pulmonary Education Topics:  Infection Prevention: - Provides verbal and written material to individual with discussion of infection control including proper hand washing and proper equipment cleaning during exercise session. Flowsheet Row Pulmonary Rehab from 11/11/2022 in [facility] Cardiac and Pulmonary Rehab  Date 07/30/22  Educator NT  Instruction Review Code 1- Verbalizes Understanding       Falls Prevention: - Provides verbal and written material to individual with discussion of falls prevention and safety. Flowsheet Row Pulmonary Rehab from 11/11/2022 in [facility] Cardiac and Pulmonary Rehab  Date 07/30/22  Educator NT  Instruction Review Code 1- Verbalizes Understanding       Chronic Lung Disease Review: - Group verbal  instruction with posters, models, PowerPoint presentations and videos,  to review new updates, new respiratory medications, new advancements in procedures and treatments. Providing information on websites and "800" numbers for continued self-education. Includes information about supplement oxygen, available portable oxygen systems, continuous and intermittent flow rates, oxygen safety, concentrators, and Medicare reimbursement for oxygen. Explanation of Pulmonary Drugs, including class, frequency, complications, importance of spacers, rinsing mouth after steroid MDI's, and proper cleaning methods for nebulizers. Review of basic lung anatomy and physiology related to function, structure, and complications of lung disease. Review of risk factors. Discussion about methods for diagnosing sleep apnea and types of masks and machines for OSA. Includes a review of the use of types of environmental controls: home humidity, furnaces, filters, dust mite/pet prevention, HEPA vacuums. Discussion about weather changes, air quality and the benefits of nasal washing. Instruction on Warning signs, infection symptoms, calling MD promptly, preventive modes, and value of vaccinations. Review of effective airway clearance, coughing and/or vibration techniques. Emphasizing that all should Create an Action Plan. Written material given at graduation.   AED/CPR: - Group verbal and written instruction with the use of models to demonstrate the basic use of the AED with the basic ABC's of resuscitation.    Anatomy and Cardiac Procedures: - Group verbal and visual presentation and models provide information about basic cardiac anatomy and function. Reviews the testing methods done to diagnose heart disease and the outcomes of the test results. Describes the treatment choices: Medical Management, Angioplasty, or Coronary Bypass Surgery for treating various heart conditions including Myocardial Infarction, Angina, Valve Disease, and  Cardiac Arrhythmias.  Written material given at graduation. Flowsheet Row Pulmonary Rehab from 11/11/2022 in [facility] Cardiac and Pulmonary Rehab  Education need identified 07/30/22       Medication Safety: - Group verbal and visual instruction to review commonly prescribed medications for heart and lung disease. Reviews the medication, class of the drug, and side effects. Includes the steps to properly store meds and maintain the prescription regimen.  Written material given at graduation. Flowsheet Row Pulmonary Rehab from 11/11/2022 in [facility] Cardiac and Pulmonary Rehab  Education need identified 07/30/22       Other: -Provides group and verbal instruction on various topics (see comments) Flowsheet Row Pulmonary Rehab from 11/11/2022 in [facility] Cardiac and Pulmonary Rehab  Date 11/11/22  Educator MS  [jeopardy]  Instruction Review Code 1- Verbalizes Understanding       Knowledge Questionnaire Score:  Knowledge Questionnaire Score - 07/28/22 1458       Knowledge Questionnaire Score   Pre Score 15/26              Core Components/Risk Factors/Patient Goals at Admission:  Personal Goals and Risk Factors at Admission - 07/30/22 1110       Core Components/Risk Factors/Patient Goals on Admission    Weight Management Yes;Weight Loss    Intervention Weight Management: Develop a combined nutrition and exercise program designed to reach desired caloric intake, while maintaining appropriate intake of nutrient and fiber, sodium and fats, and appropriate energy expenditure required for the weight goal.;Weight Management: Provide education and appropriate resources to help participant work on and attain dietary goals.;Weight Management/Obesity: Establish reasonable short term and long term weight goals.    Admit Weight 225 lb 9.6 oz (102.3 kg)    Goal Weight: Short Term 215 lb (97.5 kg)    Goal Weight: Long Term 195 lb (88.5 kg)    Expected Outcomes Weight Loss: Understanding of general  recommendations for a balanced deficit meal plan, which promotes 1-2 lb weight loss per week and includes a negative energy balance of [phone] kcal/d;Short Term: Continue to assess and modify interventions until short term weight is achieved;Long Term: Adherence to nutrition and physical activity/exercise program aimed toward attainment of established weight goal;Understanding recommendations for meals to include 15-35% energy as protein, 25-35% energy from fat, 35-60% energy from carbohydrates, less than 200mg  of dietary cholesterol, 20-35 gm of total fiber daily;Understanding of distribution of calorie intake throughout the day with the consumption of 4-5 meals/snacks    Heart Failure Yes    Intervention Provide a combined exercise and nutrition program that is supplemented with education, support and counseling about heart failure. Directed toward relieving symptoms such as shortness of breath, decreased exercise tolerance, and extremity edema.    Expected Outcomes Improve functional capacity of life;Short term: Attendance in program 2-3 days a week with increased exercise capacity. Reported lower sodium intake. Reported increased fruit and vegetable intake. [name] medication compliance.;Long term: Adoption of self-care skills and reduction of barriers for early signs and symptoms recognition and intervention leading to self-care maintenance.;Short term: Daily weights obtained and reported for increase. Utilizing diuretic protocols set by physician.    Hypertension Yes    Intervention Provide education on lifestyle modifcations including regular physical activity/exercise, weight management, moderate sodium restriction and increased consumption of fresh fruit, vegetables, and low fat dairy, alcohol moderation, and smoking cessation.;Monitor prescription use compliance.    Expected Outcomes Long Term: Maintenance of blood pressure at goal levels.;Short Term: Continued assessment and intervention until BP  is < 140/3mm HG in hypertensive participants. < 130/70mm HG in hypertensive participants with diabetes, heart failure or chronic kidney disease.    Lipids Yes    Intervention Provide education and support for participant on nutrition & aerobic/resistive exercise along with prescribed medications to achieve LDL 70mg , HDL >40mg .    Expected Outcomes Short Term: Participant states understanding of desired cholesterol values and is compliant with medications prescribed. Participant is following exercise prescription and nutrition guidelines.;Long Term: Cholesterol controlled with medications as prescribed, with individualized exercise RX and with personalized nutrition plan. Value goals: LDL < 70mg , HDL > 40 mg.             Education:Diabetes - Individual verbal and written instruction to review signs/symptoms of diabetes, desired ranges of glucose level fasting, after  meals and with exercise. Acknowledge that pre and post exercise glucose checks will be done for 3 sessions at entry of program.   Know Your Numbers and Heart Failure: - Group verbal and visual instruction to discuss disease risk factors for cardiac and pulmonary disease and treatment options.  Reviews associated critical values for Overweight/Obesity, Hypertension, Cholesterol, and Diabetes.  Discusses basics of heart failure: signs/symptoms and treatments.  Introduces Heart Failure Zone chart for action plan for heart failure.  Written material given at graduation. Flowsheet Row Pulmonary Rehab from 11/11/2022 in [facility] Cardiac and Pulmonary Rehab  Education need identified 07/30/22       Core Components/Risk Factors/Patient Goals Review:   Goals and Risk Factor Review     Row Name 08/17/22 1602 11/16/22 1554 11/30/22 1559         Core Components/Risk Factors/Patient Goals Review   Personal Goals Review Improve shortness of breath with ADL's Weight Management/Obesity Develop more efficient breathing techniques such as purse  lipped breathing and diaphragmatic breathing and practicing self-pacing with activity.     Review Spoke to patient about their shortness of breath and what they can do to improve. Patient has been informed of breathing techniques when starting the program. Patient is informed to tell staff if they have had any med changes and that certain meds they are taking or not taking can be causing shortness of breath. Clarence [name] wants to lose weight. His weight goal is 190 pounds. He is going to try to lose some weight in the next few weeks. He eats alot more starches than he should. He is going to try to limit these foods. Diaphragmatic and PLB breathing explained and performed with patient. Patient has a better understanding of how to do these exercises to help with breathing performance and relaxation. Patient performed breathing techniques adequately and to practice further at home.     Expected Outcomes Short: Attend [facility] regularly to improve shortness of breath with ADL's. Long: maintain independence with ADL's Short: lose a few pounds in the next few weeks. Long: reach a weight goal. Short: practice PLB and diaphragmatic breathing at home. Long: Use PLB and diaphragmatic breathing independently post [facility].              Core Components/Risk Factors/Patient Goals at Discharge (Final Review):   Goals and Risk Factor Review - 11/30/22 1559       Core Components/Risk Factors/Patient Goals Review   Personal Goals Review Develop more efficient breathing techniques such as purse lipped breathing and diaphragmatic breathing and practicing self-pacing with activity.    Review Diaphragmatic and PLB breathing explained and performed with patient. Patient has a better understanding of how to do these exercises to help with breathing performance and relaxation. Patient performed breathing techniques adequately and to practice further at home.    Expected Outcomes Short: practice PLB and diaphragmatic breathing  at home. Long: Use PLB and diaphragmatic breathing independently post [facility].             ITP Comments:  ITP Comments     Row Name 07/28/22 1308 07/30/22 1108 08/03/22 1539 08/05/22 0831 09/02/22 1159   ITP Comments Initial orientation completed. Diagnosis can be found in [facility] 2/28. EP Orientation scheduled for Thursday 3/14 at 9:30am. Completed and gym orientation. Initial ITP created and sent for review to Dr. Vida Rigger, Medical Director. First full day of exercise!  Patient was oriented to gym and equipment including functions, settings, policies, and procedures.  Patient's individual exercise prescription  and treatment plan were reviewed.  All starting workloads were established based on the results of the 6 minute walk test done at initial orientation visit.  The plan for exercise progression was also introduced and progression will be customized based on patient's performance and goals. 30 Day review completed. Medical Director ITP review done, changes made as directed, and signed approval by Medical Director.     new to program 30 day review completed. ITP sent to Dr. Jinny Sanders, Medical Director of  Pulmonary Rehab. Continue with ITP unless changes are made by physician.    Row Name 09/21/22 1531 09/29/22 0835 09/30/22 0847 09/30/22 1154 10/06/22 1553   ITP Comments Maurine Minister stopped by to inform staff that they are wanting to do a heart cath next week because of his symptoms. He is following closely with his doctors to manage his blood pressure concerns and his chest pain. He will come back to class after his heart cath. Since Virden has been out on medical hold, we have not been able to assess for goals this round. 30 Day review completed. Medical Director ITP review done, changes made as directed, and signed approval by Medical Director.   out with medical concerns.  admitted to [facility] 5/14 Called patient to follow up on medical hold with pulmonary rehab. They are still adjusting  some of his medications and he is due to see his cardiologist today, 5/15 for a follow up appointment with Dr. Brion Aliment. Patient will discuss with him on what they want to do and if he is appropriate to return to rehab. Patient is aware he needs to be feeling good and needs to receive clearance to return to rehab. Patient verbalized understanding. Remain patient on medical hold until further notice. Meril continues his medical hold as he has been getting evaluated for symptoms with testing including a heart cath. RN spoke with patient yesterday who advised patient to speak with his heart failure doctor at his f/u appt on 5/29 to seek clearance to return to rehab. Per patient, he has still been experiencing symptoms and therefore we need his MD to state whether or not he is cleared to exercise.    Row Name 10/22/22 1426 10/28/22 1100 11/11/22 1122 11/24/22 1431 12/23/22 1150   ITP Comments Called to check on patient.  He has been out with chest pain and heart failure work up.  He has not attended since 09/07/22.  Unable to assess for goal again 30 Day review completed. Medical Director ITP review done, changes made as directed, and signed approval by Medical Director.   out medical reason Mr. Chess came in to rehab today to notify staff that he was seen by Dr. Shirlee Latch today in the heart failure clinic and was cleared to restart rehab. Per ov note 11/11/22 pt may return to cardiac rehab. Pt is scheduled to restart this afternoon at 3:30 pm. 30 Day review completed. Medical Director ITP review done, changes made as directed, and signed approval by Medical Director. 30 Day review completed. Medical Director ITP review done, changes made as directed, and signed approval by Medical Director.    Row Name 12/30/22 1534           ITP Comments Shigeo graduated today from  rehab with 36 sessions completed.  Details of the patient's exercise prescription and what He needs to do in order to continue the prescription and  progress were discussed with patient.  Patient was given a copy of prescription and goals.  Patient  verbalized understanding.  Clarence [name] plans to continue to exercise by walking.                Comments: Discharge ITP

## 2022-12-30 NOTE — Progress Notes (Signed)
Daily Session Note  Patient Details  Name: Clarence [name] MRN: [id number] Date of Birth: 28-Nov-1970 Referring Provider:   Flowsheet Row Pulmonary Rehab from 07/30/2022 in [facility] Cardiac and Pulmonary Rehab  Referring Provider Dr. Yvonne Kendall, MD       Encounter Date: 12/30/2022  Check In:  Session Check In - 12/30/22 1533       Check-In   Supervising physician immediately available to respond to emergencies See telemetry face sheet for immediately available ER MD    Location [facility]-Cardiac & Pulmonary Rehab    Staff Present Hulen Luster, BS, RRT, CPFT; Jewel Baize, RN Atilano Median, RN, ADN    Virtual Visit No    Medication changes reported     No    Fall or balance concerns reported    No    Warm-up and Cool-down Performed on first and last piece of equipment    Resistance Training Performed Yes    VAD Patient? No    PAD/SET Patient? No      Pain Assessment   Currently in Pain? No/denies                Social History   Tobacco Use  Smoking Status Former   Types: Cigars   Quit date: 08/13/2021   Years since quitting: 1.3  Smokeless Tobacco Not on file    Goals Met:  Independence with exercise equipment Exercise tolerated well No report of concerns or symptoms today Strength training completed today  Goals Unmet:  Not Applicable  Comments:  Mahyar graduated today from  rehab with 36 sessions completed.  Details of the patient's exercise prescription and what He needs to do in order to continue the prescription and progress were discussed with patient.  Patient was given a copy of prescription and goals.  Patient verbalized understanding.  Andre plans to continue to exercise by walking.    Dr. Bethann Punches is Medical Director for [facility] Cardiac Rehabilitation.  Dr. Vida Rigger is Medical Director for [facility] Pulmonary Rehabilitation.

## 2022-12-30 NOTE — Progress Notes (Signed)
Discharge Summary  Tade Ostertag DOB: 05/26/1970  Clarence [name] graduated today from  rehab with 36 sessions completed.  Details of the patient's exercise prescription and what He needs to do in order to continue the prescription and progress were discussed with patient.  Patient was given a copy of prescription and goals.  Patient verbalized understanding.  Najae plans to continue to exercise by walking.    6 Minute Walk     Row Name 07/30/22 1343 12/17/22 1552       6 Minute Walk   Phase Initial Discharge    Distance 940 feet 900 feet    Distance % Change -- -4.3 %    Distance Feet Change -- -40 ft    Walk Time 6 minutes 6 minutes    # of Rest Breaks 0 0    MPH 1.78 1.7    METS 2.78 2.38    RPE 13 15    Perceived Dyspnea  2 3    VO2 Peak 9.73 8.31    Symptoms Yes (comment) Yes (comment)    Comments SOB Leg cramps and nausea    Resting HR 61 bpm 87 bpm    Resting BP 104/62 90/60    Resting Oxygen Saturation  94 % 97 %    Exercise Oxygen Saturation  during 6 min walk 94 % 95 %    Max Ex. HR 90 bpm 97 bpm    Max Ex. BP 124/72 110/62    2 Minute Post BP 100/64 90/60      Interval HR   1 Minute HR 83 95    2 Minute HR 88 94    3 Minute HR 86 88    4 Minute HR 86 97    5 Minute HR 90 94    6 Minute HR 84 92    2 Minute Post HR 61 80    Interval Heart Rate? Yes Yes      Interval Oxygen   Interval Oxygen? Yes Yes    Baseline Oxygen Saturation % 94 % 97 %    1 Minute Oxygen Saturation % 94 % 95 %    1 Minute Liters of Oxygen 0 L  RA 0 L    2 Minute Oxygen Saturation % -- 95 %    2 Minute Liters of Oxygen 0 L  RA 0 L    3 Minute Oxygen Saturation % 94 % 96 %    3 Minute Liters of Oxygen 0 L  RA 0 L    4 Minute Oxygen Saturation % 94 % 96 %    4 Minute Liters of Oxygen 0 L  RA 0 L    5 Minute Oxygen Saturation % 94 % 96 %    5 Minute Liters of Oxygen 0 L  RA 0 L    6 Minute Oxygen Saturation % 94 % 96 %    6 Minute Liters of Oxygen 0 L  RA 0 L    2 Minute Post Oxygen  Saturation % 96 % 97 %    2 Minute Post Liters of Oxygen 0 L  RA 0 L

## 2022-12-31 ENCOUNTER — Other Ambulatory Visit (HOSPITAL_COMMUNITY): Payer: Self-pay

## 2023-01-04 ENCOUNTER — Encounter: Payer: Medicaid Other | Admitting: Family

## 2023-01-04 ENCOUNTER — Ambulatory Visit: Payer: Medicaid Other | Attending: Cardiology | Admitting: Cardiology

## 2023-01-04 ENCOUNTER — Encounter: Payer: Self-pay | Admitting: Family

## 2023-01-04 VITALS — BP 105/75 | HR 58 | Ht 70.0 in | Wt 234.0 lb

## 2023-01-04 DIAGNOSIS — I5022 Chronic systolic (congestive) heart failure: Secondary | ICD-10-CM | POA: Diagnosis not present

## 2023-01-04 MED ORDER — SPIRONOLACTONE 25 MG PO TABS: 25.0000 mg | ORAL_TABLET | Freq: Every day | ORAL | 1 refills | Status: DC

## 2023-01-04 NOTE — Patient Instructions (Addendum)
Medication Changes:  Increase Spironolactone to 25 mg once daily at bedtime  Lab Work:  Labs done today, your results will be available in MyChart, we will contact you for abnormal readings.   Return to [facility] in 10 days for repeat lab work.  Testing/Procedures:  The doctor wants you to have an arterial Doppler ultrasound of your lower extremities.   You have been scheduled for Wednesday, August 28th at 1:30 PM. Arrive at the [facility] for check in at [employer]  Special Instructions // Education:  Do the following things EVERYDAY: Weigh yourself in the morning before breakfast. Write it down and keep it in a log. Take your medicines as prescribed Eat low salt foods--Limit salt (sodium) to 2000 mg per day.  Stay as active as you can everyday Limit all fluids for the day to less than 2 liters   Follow-Up in: 3 months. Please call the office to schedule this appointment.    If you have any questions or concerns before your next appointment please send [place] a message through [place] or call our office at [phone] Monday-Friday 8 am-5 pm.   If you have an urgent need after hours on the weekend please call your Primary Cardiologist or the Advanced Heart Failure Clinic in [place] at [phone].

## 2023-01-04 NOTE — Progress Notes (Signed)
PCP: Caesar Bookman, NP Cardiology: Dr. Okey Dupre HF Cardiology: Dr Shirlee Latch  52 y.o. with history of CAD, chronic systolic CHF, Crohns disease, CKD stage 3, and HTN was referred by Dr. Okey Dupre to HF clinic for evaluation of CHF.  Patient was admitted in 3/23 with newly found cardiomyopathy, CHF and atypical PNA.  Echo in 3/23 showed EF 30-35%, moderate LVH, normal RV. LHC showed occluded RCA with collaterals, 60% mLAD, 65% mLCx, 90% mid OM1.  RHC showed elevated filling pressures and low CI (2).  Patient was admitted again in 4/23 with CHF. He was admitted in 5/23 with hypertensive urgency and chest pain, CTA abdomen/pelvis did not show renal artery stenosis. He was admitted again in 6/23 with hypertensive urgency.  He quit smoking after 3/23 admission.  Rarely drinks ETOH and no drugs.  No strong family history of CAD or cardiomyopathy.    Sleep study was done, not suggestive of OSA.   He was admitted in 4/24 with chest pain => no evidence for ACS.  He had severe watery diarrhea via his ostomy and was dehydrated. Meds were held. Echo in 4/24 was improved, showing EF 45-50%, inferior and inferolateral hypokinesis, normal RV.   Patient continued to have chest pain episodes so LHC/RHC was done in 5/24 showing occluded mRCA with collaterals, 90% stenosis with severe diffuse disease in OM1. 50% mLAD, 60% proximal and mid LCx (unchanged from prior, no interventional target); normal filling pressures and preserved cardiac output.   Patient went to the ER 11/06/22 with atypical chest pain, negative workup.  He was in the ER again 11/10/22 with lightheadedness.  BP stable in ER but he said that SBP was as low as 80s at home. BP-active meds were adjusted.   Patient returns for followup of CHF and CAD.  He seems to be doing better keeping track of his meds. Occasional atypical chest pain.  No dyspnea walking on flat ground generally though he reports dyspnea if he tries to talk and walk at the same time.  Has tingling in  his feet bilaterally. Also gets pain in his left calf, not clear if there is a trigger.  No further lightheadedness, BP is stable. Weight up 7 lbs. He has completed pulmonary rehab, feels like it helped.   ECG (personally reviewed): NSR, old inferior MI, QTc normal  REDS clip 33%  Labs (5/23): LDL 43, TGs 382 Labs (6/23): K 4.2, creatinine 1.16 Labs (7/23): K 4.2, creatinine 1.42, LDL 70, TGs 214 Labs (11/23): K 4.1, creatinine 1.27 Labs (2/24): LDL 81 Labs (5/24): K 4.9, creatinine 1.72 => 1.56, BNP 78, LDL 59, TGs 165 Labs (6/24): K 3.9, creatinine 1.83, hgb 14.2 Labs (8/24): K 4.4, creatinine 1.83  PMH: 1. Crohns disease: s/p colectomy with ostomy.  2. CKD stage 3: History of hyperkalemia.  3. HTN: CT abdomen/pelvis with no evidence for renal artery stenosis.  4. Pulmonary nodules: Stable on last CT.  5. CAD: LHC 3/23 with CTO mid RCA with collaterals, 60% mLAD, 65% mLCx, 90% mid OM1.  - LHC (5/24): occluded mRCA with collaterals, 90% stenosis with severe diffuse disease in OM1. 50% mLAD, 60% proximal and mid LCx (unchanged from prior, no interventional target) 6. Chronic systolic CHF: Ischemic cardiomyopathy (cannot fully rule out nonischemic component).  - Echo (3/23): EF 30-35%, moderate LVH, normal RV. - RHC (3/23): mean RA 16, PA 67/30 mean 42, mean PCWP 25, CI 2, PVR 4 - Echo (5/23): EF 35-40%, global HK, moderate LVH, RV normal, mild MR -  Echo (4/24): EF 45-50%, inferior and inferolateral hypokinesis, normal RV.  - RHC (5/24): mean RA 4, PA 36/9, mean PCWP 10, CI 2.36, PVR 1.7 WU 7. Sleep study negative for OSA.   SH: Single, quit smoking in 3/23, rare ETOH/no drugs, lives in [place] and works for a [employer].   FH: Uncles with "heart trouble"  ROS: All systems reviewed and negative except as per HPI.   Current Outpatient Medications  Medication Sig Dispense Refill   acetaminophen (TYLENOL) 325 MG tablet Take 650 mg by mouth at bedtime.     aspirin EC 81 MG  tablet Take 1 tablet (81 mg total) by mouth daily. Swallow whole. 30 tablet 4   atorvastatin (LIPITOR) 80 MG tablet Take 1 tablet (80 mg total) by mouth every evening. 90 tablet 3   carvedilol (COREG) 6.25 MG tablet Take 6.25 mg by mouth 2 (two) times daily with a meal.     clopidogrel (PLAVIX) 75 MG tablet Take 1 tablet (75 mg total) by mouth daily. 30 tablet 3   empagliflozin (JARDIANCE) 10 MG TABS tablet Take 10 mg by mouth daily.     ezetimibe (ZETIA) 10 MG tablet Take 1 tablet (10 mg total) by mouth daily. 90 tablet 3   famotidine (PEPCID) 10 MG tablet Take 10 mg by mouth at bedtime. As needed     fenofibrate (TRICOR) 48 MG tablet Take 1 tablet (48 mg total) by mouth daily. 30 tablet 8   furosemide (LASIX) 20 MG tablet Take 1 tablet (20 mg total) by mouth daily as needed. For weight gain of 3 lb in 24 hours or 5 lbs in one week 90 tablet 1   gabapentin (NEURONTIN) 100 MG capsule Take 1 capsule (100 mg total) by mouth 3 (three) times daily as needed. 10 capsule 0   isosorbide mononitrate (IMDUR) 60 MG 24 hr tablet Take 1 tablet (60 mg total) by mouth daily. 90 tablet 3   nitroGLYCERIN (NITROSTAT) 0.4 MG SL tablet Place 1 tablet (0.4 mg total) under the tongue every 5 (five) minutes as needed for chest pain. 25 tablet 3   ondansetron (ZOFRAN) 4 MG tablet Take 1 tablet (4 mg total) by mouth every 8 (eight) hours as needed for nausea or vomiting. 20 tablet 0   pantoprazole (PROTONIX) 40 MG tablet Take 1 tablet (40 mg total) by mouth daily. 30 tablet 11   ranolazine (RANEXA) 500 MG 12 hr tablet Take 500 mg by mouth 2 (two) times daily.     sacubitril-valsartan (ENTRESTO) 24-26 MG Take 1 tablet by mouth 2 (two) times daily. 180 tablet 1   spironolactone (ALDACTONE) 25 MG tablet Take 1 tablet (25 mg total) by mouth daily. 90 tablet 1   Current Facility-Administered Medications  Medication Dose Route Frequency Provider Last Rate Last Admin   sodium chloride flush (NS) 0.9 % injection 3 mL  3 mL  Intravenous Q12H Laurey Morale, MD       Wt Readings from Last 3 Encounters:  01/04/23 234 lb (106.1 kg)  12/28/22 230 lb (104.3 kg)  12/17/22 235 lb (106.6 kg)   BP 105/75   Pulse (!) 58   Ht 5\' 10"  (1.778 m)   Wt 234 lb (106.1 kg)   SpO2 98%   BMI 33.58 kg/m  General: NAD Neck: No JVD, no thyromegaly or thyroid nodule.  Lungs: Clear to auscultation bilaterally with normal respiratory effort. CV: Nondisplaced PMI.  Heart regular S1/S2, no S3/S4, no murmur.  No peripheral edema.  No carotid bruit.  Unable to palpate pedal pulses.  Abdomen: Soft, nontender, no hepatosplenomegaly, no distention.  Skin: Intact without lesions or rashes.  Neurologic: Alert and oriented x 3.  Psych: Normal affect. Extremities: No clubbing or cyanosis.  HEENT: Normal.   Assessment/Plan: 1. Chronic systolic CHF: Suspect primarily ischemic cardiomyopathy but cannot rule out component of nonischemic cardiomyopathy. RHC in 3/23 with elevated filling pressures and low CI, 2. Echo in 5/23 with EF 35-40%, normal RV.  Echo in 4/24 with EF up to 45-50%, inferior/inferolateral hypokinesis. NYHA class II, not volume overloaded on exam or by REDS clip.  GDMT has been limited by lightheadedness and CKD.  - Continue Entresto 24/26 bid, no room to increase with orthostatic symptoms.    - Continue Coreg 6.25 mg bid.  - Continue Jardiance 10 mg daily.  - Increase spironolactone to 25 mg at bedtime. BMET/BNP today and again in 10 days.  - EF is out of ICD range.  2. CAD: Cath in 3/23 with CTO mid RCA with collaterals, 60% mLAD, 65% mLCx, 90% mid OM1.  Cath in 5/24 was very similar with occluded mRCA with collaterals, 90% stenosis with severe diffuse disease in OM1. 50% mLAD, 60% proximal and mid LCx.  On cath, he has no interventional target though suspect he could get some angina from the diffusely disease OM1 (occluded RCA has good collaterals).  Chest pain improved on ranolazine, now only has occasional atypical chest  pain.  - Continue Imdur 30 mg daily.  - Continue Coreg - Continue ranolazone. QTc ok on ECG.  - Continue ASA 81 and Plavix 75 daily.  - Continue atorvastatin and Zetia, good lipids in 5/24.  - Continue fenofibrate with elevated triglycerides.  3. CKD: Stage 3.  Follow closely, most recent creatinine 1.83.  4. PAD: Patient has tingling/numbness in feet bilaterally that sounds most like neuropathy.  He does not have diabetes.  However, he also has weak pedal pulses and pain in the left calf (not clearly claudication).  - I will arrange for peripheral arterial doppler evaluation.  - I will check B12 with possible peripheral neuropathy.   Followup in 3 months.   Clarence [name] 01/04/2023

## 2023-01-05 ENCOUNTER — Ambulatory Visit: Payer: Medicaid Other | Admitting: Family

## 2023-01-05 ENCOUNTER — Encounter: Payer: Self-pay | Admitting: Family

## 2023-01-05 VITALS — BP 131/88 | HR 65 | Temp 97.4°F | Resp 18 | Ht 70.0 in | Wt 232.2 lb

## 2023-01-05 DIAGNOSIS — I10 Essential (primary) hypertension: Secondary | ICD-10-CM | POA: Diagnosis not present

## 2023-01-05 DIAGNOSIS — G603 Idiopathic progressive neuropathy: Secondary | ICD-10-CM

## 2023-01-05 LAB — BASIC METABOLIC PANEL
BUN/Creatinine Ratio: 13 (ref 9–20)
BUN: 22 mg/dL (ref 6–24)
CO2: 22 mmol/L (ref 20–29)
Calcium: 9.9 mg/dL (ref 8.7–10.2)
Chloride: 106 mmol/L (ref 96–106)
Creatinine, Ser: 1.76 mg/dL — ABNORMAL HIGH (ref 0.76–1.27)
Glucose: 108 mg/dL — ABNORMAL HIGH (ref 70–99)
Potassium: 5.1 mmol/L (ref 3.5–5.2)
Sodium: 142 mmol/L (ref 134–144)
eGFR: 46 mL/min/{1.73_m2} — ABNORMAL LOW (ref >59)

## 2023-01-05 LAB — VITAMIN B12: Vitamin B-12: 439 pg/mL (ref 232–1245)

## 2023-01-05 LAB — BRAIN NATRIURETIC PEPTIDE: BNP: 62.9 pg/mL (ref 0.0–100.0)

## 2023-01-05 NOTE — Patient Instructions (Signed)
Peripheral Neuropathy Peripheral neuropathy is a type of nerve damage. It affects nerves that carry signals between the spinal cord and the arms, legs, and the rest of the body (peripheral nerves). It does not affect nerves in the spinal cord or brain. In peripheral neuropathy, one nerve or a group of nerves may be damaged. Peripheral neuropathy is a broad category that includes many specific nerve disorders, like diabetic neuropathy, hereditary neuropathy, and carpal tunnel syndrome. What are the causes? This condition may be caused by: Certain diseases, such as: Diabetes. This is the most common cause of peripheral neuropathy. Autoimmune diseases, such as rheumatoid arthritis and systemic lupus erythematosus. Nerve diseases that are passed from parent to child (inherited). Kidney disease. Thyroid disease. Other causes may include: Nerve injury. Pressure or stress on a nerve that lasts a long time. Lack (deficiency) of B vitamins. This can result from alcoholism, poor diet, or a restricted diet. Infections. Some medicines, such as cancer medicines (chemotherapy). Poisonous (toxic) substances, such as lead and mercury. Too little blood flowing to the legs. In some cases, the cause of this condition is not known. What are the signs or symptoms? Symptoms of this condition depend on which of your nerves is damaged. Symptoms in the legs, hands, and arms can include: Loss of feeling (numbness) in the feet, hands, or both. Tingling in the feet, hands, or both. Burning pain. Very sensitive skin. Weakness. Not being able to move a part of the body (paralysis). Clumsiness or poor coordination. Muscle twitching. Loss of balance. Symptoms in other parts of the body can include: Not being able to control your bladder. Feeling dizzy. Sexual problems. How is this diagnosed? Diagnosing and finding the cause of peripheral neuropathy can be difficult. Your health care provider will take your  medical history and do a physical exam. A neurological exam will also be done. This involves checking things that are affected by your brain, spinal cord, and nerves (nervous system). For example, your health care provider will check your reflexes, how you move, and what you can feel. You may have other tests, such as: Blood tests. Electromyogram (EMG) and nerve conduction tests. These tests check nerve function and how well the nerves are controlling the muscles. Imaging tests, such as a CT scan or MRI, to rule out other causes of your symptoms. Removing a small piece of nerve to be examined in a lab (nerve biopsy). Removing and examining a small amount of the fluid that surrounds the brain and spinal cord (lumbar puncture). How is this treated? Treatment for this condition may involve: Treating the underlying cause of the neuropathy, such as diabetes, kidney disease, or vitamin deficiencies. Stopping medicines that can cause neuropathy, such as chemotherapy. Medicine to help relieve pain. Medicines may include: Prescription or over-the-counter pain medicine. Anti-seizure medicine. Antidepressants. Pain-relieving patches that are applied to painful areas of skin. Surgery to relieve pressure on a nerve or to destroy a nerve that is causing pain. Physical therapy to help improve movement and balance. Devices to help you move around (assistive devices). Follow these instructions at home: Medicines Take over-the-counter and prescription medicines only as told by your health care provider. Do not take any other medicines without first asking your health care provider. Ask your health care provider if the medicine prescribed to you requires you to avoid driving or using machinery. Lifestyle  Do not use any products that contain nicotine or tobacco. These products include cigarettes, chewing tobacco, and vaping devices, such as e-cigarettes. Smoking keeps  blood from reaching damaged nerves. If you  need help quitting, ask your health care provider. Avoid or limit alcohol. Too much alcohol can cause a vitamin B deficiency, and vitamin B is needed for healthy nerves. Eat a healthy diet. This includes: Eating foods that are high in fiber, such as beans, whole grains, and fresh fruits and vegetables. Limiting foods that are high in fat and processed sugars, such as fried or sweet foods. General instructions  If you have diabetes, work closely with your health care provider to keep your blood sugar under control. If you have numbness in your feet: Check every day for signs of injury or infection. Watch for redness, warmth, and swelling. Wear padded socks and comfortable shoes. These help protect your feet. Develop a good support system. Living with peripheral neuropathy can be stressful. Consider talking with a mental health specialist or joining a support group. Use assistive devices and attend physical therapy as told by your health care provider. This may include using a walker or a cane. Keep all follow-up visits. This is important. Where to find more information [employer] of Neurological Disorders: [employer] Contact a health care provider if: You have new signs or symptoms of peripheral neuropathy. You are struggling emotionally from dealing with peripheral neuropathy. Your pain is not well controlled. Get help right away if: You have an injury or infection that is not healing normally. You develop new weakness in an arm or leg. You have fallen or do so frequently. Summary Peripheral neuropathy is when the nerves in the arms or legs are damaged, resulting in numbness, weakness, or pain. There are many causes of peripheral neuropathy, including diabetes, pinched nerves, vitamin deficiencies, autoimmune disease, and hereditary conditions. Diagnosing and finding the cause of peripheral neuropathy can be difficult. Your health care provider will take your medical  history, do a physical exam, and do tests, including blood tests and nerve function tests. Treatment involves treating the underlying cause of the neuropathy and taking medicines to help control pain. Physical therapy and assistive devices may also help. This information is not intended to replace advice given to you by your health care provider. Make sure you discuss any questions you have with your health care provider. Document Revised: 01/07/2021 Document Reviewed: 01/07/2021 [employer]vier Patient Education  2024 [employer].

## 2023-01-05 NOTE — Progress Notes (Signed)
Provider: Richarda Blade [name]-C  Gamal Todisco, Donalee Citrin, NP  Patient Care Team: Lavelle Akel, Donalee Citrin, NP as PCP - General (Family Medicine) End, Cristal Deer, MD as PCP - Cardiology (Cardiology) Laurey Morale, MD as PCP - Advanced Heart Failure (Cardiology)  Extended Emergency Contact Information Primary Emergency Contact: Abello,Yvonne Home Phone: [phone] Relation: Mother  Code Status:  Full Code Goals of care: Advanced Directive information    01/05/2023    1:25 PM  Advanced Directives  Does Patient Have a Medical Advance Directive? No  Would patient like information on creating a medical advance directive? No - Patient declined     Chief Complaint  Patient presents with   Transitions Of Care     for numbness     HPI:  Pt is a 52 y.o. male seen today for an acute visit for follow up  ED visit 12/28/2022 follow up for paraesthesias in lower extremities x 4 weeks.Numbness and tingling stops at the ankles.States was told by one of the nurse that could be a blood clot.states got worried and went to ED.ultrasound was negative for DVT.He was prescribed Gabapentin 100 mg capsule three times PRN   He was seen by cardiologist yesterday arterial Dopper ultrasound  to be done on 01/13/2023 at [facility]  Also had vitamin B 12 level and BMP done yesterday.    Past Medical History:  Diagnosis Date   Ascending Aortic Dilation    a. 09/2021 Echo: Asc Ao 40mm.   CAD (coronary artery disease)    a. 07/2021 Cath: LM nl, LAD 102m, D2 40, LCX 65p/m, OM1 90, RCA 148m w/ L-.R collats to RPDA from septal 1/2-->Med rx.   Chronic HFrEF (heart failure with reduced ejection fraction) (HCC)    a. 07/2021 Echo: EF 30-35%, glob HK, mod LVH, GrII DD, nl RV fxn, mildly dil LA, mild-mod MR; b. 09/2021 Echo: EF 35-40%, glob HK, mod LVH, GrI DD, nl RV fxn, mildly dil RA, mild MR, Asc Ao 40mm.   CKD (chronic kidney disease) stage 2, GFR 60-89 ml/min    Crohn's disease (HCC)    Hypertension    Mixed Ischemic &  Nonischemic cardiomyopathy    a. 07/2021 Echo: EF 30-35%; b. 07/2021 Cath: Occluded RCA w/ mod LAD/LCX dzs, and severe OM1 dzs-->Med Rx; c. 09/2021 Echo: EF 35-40%.   PAH (pulmonary artery hypertension) (HCC)    a. 07/2021 RHC: PA 67/30 (42).   Proteinuria    Past Surgical History:  Procedure Laterality Date   COLECTOMY     RIGHT/LEFT HEART CATH AND CORONARY ANGIOGRAPHY N/A 08/07/2021   Procedure: RIGHT/LEFT HEART CATH AND CORONARY ANGIOGRAPHY;  Surgeon: Yvonne Kendall, MD;  Location: [facility] INVASIVE CV LAB;  Service: Cardiovascular;  Laterality: N/A;   RIGHT/LEFT HEART CATH AND CORONARY ANGIOGRAPHY N/A 09/29/2022   Procedure: RIGHT/LEFT HEART CATH AND CORONARY ANGIOGRAPHY;  Surgeon: Laurey Morale, MD;  Location: [facility] INVASIVE CV LAB;  Service: Cardiovascular;  Laterality: N/A;    Allergies  Allergen Reactions   Ibuprofen Other (See Comments)    History of GIB with ibuprofen    Sulfacetamide Other (See Comments)   Neomycin-Bacitracin Zn-Polymyx     Other reaction(s): Other (See Comments)   Spironolactone     Hyperkalemia    Outpatient Encounter Medications as of 01/05/2023  Medication Sig   acetaminophen (TYLENOL) 325 MG tablet Take 650 mg by mouth at bedtime.   aspirin EC 81 MG tablet Take 1 tablet (81 mg total) by mouth daily. Swallow whole.   atorvastatin (  LIPITOR) 80 MG tablet Take 1 tablet (80 mg total) by mouth every evening.   carvedilol (COREG) 6.25 MG tablet Take 6.25 mg by mouth 2 (two) times daily with a meal.   clopidogrel (PLAVIX) 75 MG tablet Take 1 tablet (75 mg total) by mouth daily.   empagliflozin (JARDIANCE) 10 MG TABS tablet Take 10 mg by mouth daily.   ezetimibe (ZETIA) 10 MG tablet Take 1 tablet (10 mg total) by mouth daily.   famotidine (PEPCID) 10 MG tablet Take 10 mg by mouth at bedtime. As needed   fenofibrate (TRICOR) 48 MG tablet Take 1 tablet (48 mg total) by mouth daily.   furosemide (LASIX) 20 MG tablet Take 1 tablet (20 mg total) by mouth daily as  needed. For weight gain of 3 lb in 24 hours or 5 lbs in one week   gabapentin (NEURONTIN) 100 MG capsule Take 1 capsule (100 mg total) by mouth 3 (three) times daily as needed.   isosorbide mononitrate (IMDUR) 60 MG 24 hr tablet Take 1 tablet (60 mg total) by mouth daily.   nitroGLYCERIN (NITROSTAT) 0.4 MG SL tablet Place 1 tablet (0.4 mg total) under the tongue every 5 (five) minutes as needed for chest pain.   ondansetron (ZOFRAN) 4 MG tablet Take 1 tablet (4 mg total) by mouth every 8 (eight) hours as needed for nausea or vomiting.   pantoprazole (PROTONIX) 40 MG tablet Take 1 tablet (40 mg total) by mouth daily.   ranolazine (RANEXA) 500 MG 12 hr tablet Take 500 mg by mouth 2 (two) times daily.   sacubitril-valsartan (ENTRESTO) 24-26 MG Take 1 tablet by mouth 2 (two) times daily.   spironolactone (ALDACTONE) 25 MG tablet Take 1 tablet (25 mg total) by mouth daily.   Facility-Administered Encounter Medications as of 01/05/2023  Medication   sodium chloride flush (NS) 0.9 % injection 3 mL    Review of Systems  Constitutional:  Negative for appetite change, chills, fatigue, fever and unexpected weight change.  HENT:  Negative for congestion, dental problem, ear discharge, ear pain, facial swelling, hearing loss, nosebleeds, postnasal drip, rhinorrhea, sinus pressure, sinus pain, sneezing, sore throat, tinnitus and trouble swallowing.   Eyes:  Negative for pain, discharge, redness, itching and visual disturbance.  Respiratory:  Negative for cough, chest tightness, shortness of breath and wheezing.   Cardiovascular:  Negative for chest pain, palpitations and leg swelling.  Gastrointestinal:  Negative for abdominal distention, abdominal pain, blood in stool, constipation, diarrhea, nausea and vomiting.  Endocrine: Negative for cold intolerance, heat intolerance, polydipsia, polyphagia and polyuria.  Genitourinary:  Negative for difficulty urinating, dysuria, flank pain, frequency and urgency.   Musculoskeletal:  Negative for arthralgias, back pain, gait problem, joint swelling, myalgias, neck pain and neck stiffness.  Skin:  Negative for color change, pallor, rash and wound.  Neurological:  Positive for numbness. Negative for dizziness, syncope, speech difficulty, weakness, light-headedness and headaches.  Hematological:  Does not bruise/bleed easily.  Psychiatric/Behavioral:  Negative for agitation, behavioral problems, confusion, hallucinations and sleep disturbance. The patient is not nervous/anxious.     Immunization History  Administered Date(s) Administered   Moderna SARS-COV2 Booster Vaccination 04/21/2020, 01/07/2021   Tdap 05/18/2013   Zoster Recombinant(Shingrix) 01/07/2021, 07/29/2022   Pertinent  Health Maintenance Due  Topic Date Due   INFLUENZA VACCINE  12/17/2022      01/11/2022   12:56 AM 07/15/2022    8:39 AM 07/28/2022    2:14 PM 09/18/2022    2:51 PM 01/05/2023  1:56 PM  Fall Risk  Falls in the past year?  0 0 0 0  Was there an injury with Fall?  0 0 0 0  Fall Risk Category Calculator  0 0 0 0  (RETIRED) Patient Fall Risk Level Low fall risk      Patient at Risk for Falls Due to  No Fall Risks  No Fall Risks No Fall Risks  Fall risk Follow up  Falls evaluation completed  Falls evaluation completed Falls evaluation completed   Functional Status Survey:    Vitals:   01/05/23 1324  BP: 131/88  Pulse: 65  Resp: 18  Temp: (!) 97.4 F (36.3 C)  SpO2: 97%  Weight: 232 lb 3.2 oz (105.3 kg)  Height: 5\' 10"  (1.778 m)   Body mass index is 33.32 kg/m. Physical Exam Vitals reviewed.  Constitutional:      General: He is not in acute distress.    Appearance: Normal appearance. He is obese. He is not ill-appearing or diaphoretic.  HENT:     Head: Normocephalic.     Nose: Nose normal. No congestion or rhinorrhea.     Mouth/Throat:     Mouth: Mucous membranes are moist.     Pharynx: Oropharynx is clear. No oropharyngeal exudate or posterior  oropharyngeal erythema.  Eyes:     General: No scleral icterus.       Right eye: No discharge.        Left eye: No discharge.     Extraocular Movements: Extraocular movements intact.     Conjunctiva/sclera: Conjunctivae normal.     Pupils: Pupils are equal, round, and reactive to light.  Neck:     Vascular: No carotid bruit.  Cardiovascular:     Rate and Rhythm: Normal rate and regular rhythm.     Pulses: Normal pulses.     Heart sounds: Normal heart sounds. No murmur heard.    No friction rub. No gallop.  Pulmonary:     Effort: Pulmonary effort is normal. No respiratory distress.     Breath sounds: Normal breath sounds. No wheezing, rhonchi or rales.  Chest:     Chest wall: No tenderness.  Abdominal:     General: Bowel sounds are normal. There is no distension.     Palpations: Abdomen is soft. There is no mass.     Tenderness: There is no abdominal tenderness. There is no right CVA tenderness, left CVA tenderness, guarding or rebound.  Musculoskeletal:        General: No swelling or tenderness. Normal range of motion.     Cervical back: Normal range of motion. No rigidity or tenderness.     Right lower leg: No edema.     Left lower leg: No edema.  Lymphadenopathy:     Cervical: No cervical adenopathy.  Skin:    General: Skin is warm and dry.     Coloration: Skin is not pale.     Findings: No bruising, erythema, lesion or rash.  Neurological:     Mental Status: He is alert and oriented to person, place, and time.     Cranial Nerves: No cranial nerve deficit.     Sensory: Sensory deficit present.     Motor: No weakness.     Coordination: Coordination normal.     Gait: Gait normal.     Comments: Monofilament sensation decreased on feet  Psychiatric:        Mood and Affect: Mood normal.        Speech:  Speech normal.        Behavior: Behavior normal.     Labs reviewed: Recent Labs    09/10/22 1846 09/11/22 0541 11/06/22 1412 11/10/22 1553 12/28/22 1500  NA 137    < > 135 135 137  K 5.3*   < > 4.6 3.9 4.4  CL 109   < > 105 107 106  CO2 23   < > 22 20* 24  GLUCOSE 123*   < > 131* 129* 118*  BUN 36*   < > 28* 22* 22*  CREATININE 2.28*   < > 1.98* 1.83* 1.83*  CALCIUM 9.8   < > 9.3 9.1 9.4  MG 2.2  --   --   --   --    < > = values in this interval not displayed.   Recent Labs    01/27/22 1400 07/15/22 1425 09/07/22 1725 09/11/22 0541  AST 25 26 26 20   ALT 35 42 31 31  ALKPHOS 87  --  92 65  BILITOT 1.3* 0.6 0.8 0.7  PROT 6.7 7.0 7.6 6.5  ALBUMIN 3.6  --  4.0 3.4*   Recent Labs    09/07/22 1725 09/10/22 1846 10/02/22 1446 11/06/22 1412 11/10/22 1553 12/28/22 1500  WBC 7.5   < > 6.1 7.8 6.8 7.6  NEUTROABS 5.0  --  3,489  --   --  4.8  HGB 15.8   < > 14.6 16.2 14.2 14.9  HCT 47.6   < > 43.4 49.0 42.1 44.8  MCV 83.8   < > 83.3 84.0 83.2 84.2  PLT 242   < > 223 228 199 217   < > = values in this interval not displayed.   Lab Results  Component Value Date   TSH 0.86 07/15/2022   Lab Results  Component Value Date   HGBA1C 5.4 08/05/2021   Lab Results  Component Value Date   CHOL 125 09/21/2022   HDL 33 (L) 09/21/2022   LDLCALC 59 09/21/2022   LDLDIRECT 69.0 09/09/2021   TRIG 165 (H) 09/21/2022   CHOLHDL 3.8 09/21/2022    Significant Diagnostic Results in last 30 days:  US Venous Img Lower Bilateral (DVT)  Result Date: 12/28/2022 CLINICAL DATA:  144109 Calf pain 144109 EXAM: BILATERAL LOWER EXTREMITY VENOUS DOPPLER ULTRASOUND TECHNIQUE: Gray-scale sonography with graded compression, as well as color Doppler and duplex ultrasound were performed to evaluate the lower extremity deep venous systems from the level of the common femoral vein and including the common femoral, femoral, profunda femoral, popliteal and calf veins including the posterior tibial, peroneal and gastrocnemius veins when visible. The superficial great saphenous vein was also interrogated. Spectral Doppler was utilized to evaluate flow at rest and with distal  augmentation maneuvers in the common femoral, femoral and popliteal veins. COMPARISON:  CTA PE, 12/06/2022 CT AP, 09/10/2022. FINDINGS: RIGHT LOWER EXTREMITY VENOUS Normal compressibility of the RIGHT common femoral, superficial femoral, and popliteal veins, as well as the visualized calf veins. Visualized portions of profunda femoral vein and great saphenous vein unremarkable. No filling defects to suggest DVT on grayscale or color Doppler imaging. Doppler waveforms show normal direction of venous flow, normal respiratory plasticity and response to augmentation. OTHER No evidence of superficial thrombophlebitis or abnormal fluid collection. Limitations: none LEFT LOWER EXTREMITY VENOUS Normal compressibility of the LEFT common femoral, superficial femoral, and popliteal veins, as well as the visualized calf veins. Visualized portions of profunda femoral vein and great saphenous vein unremarkable. No filling defects to  suggest DVT on grayscale or color Doppler imaging. Doppler waveforms show normal direction of venous flow, normal respiratory plasticity and response to augmentation. OTHER No evidence of superficial thrombophlebitis or abnormal fluid collection. Limitations: none IMPRESSION: No evidence of femoropopliteal DVT or superficial thrombophlebitis within either lower extremity. Roanna Banning, MD [facility] Radiology Electronically Signed   By: Roanna Banning M.D.   On: 12/28/2022 16:25    Assessment/Plan  1. Idiopathic progressive neuropathy Reports numbness and tingling for the past 2 months  Bilateral decreased monofilament sensation on both feet.pulses intact  - continue on Gabapentin  - Has upcoming appointment for duplex ultrasound tomorrow 01/06/2023 ordered by cardiologist   2. Essential hypertension B/p well controlled  - continue current medication   Family/ staff Communication: Reviewed plan of care with patient verbalized understanding    Labs/tests ordered: None   Next Appointment: Return if symptoms worsen or fail to improve.   Caesar Bookman, NP

## 2023-01-06 ENCOUNTER — Ambulatory Visit: Payer: Medicaid Other | Attending: Internal Medicine | Admitting: Internal Medicine

## 2023-01-06 NOTE — Progress Notes (Deleted)
Cardiology Office Note:  .   Date:  01/06/2023  ID:  Clarence [name], DOB [birth year], MRN [id number] PCP: Caesar Bookman, NP  [facility] Providers Cardiologist:  Yvonne Kendall, MD Advanced Heart Failure:  Marca Ancona, MD { Click to update primary MD,subspecialty MD or APP then REFRESH:1}    History of Present Illness: .   Clarence [name] is a 52 y.o. male multivessel coronary artery disease, chronic HFrEF due to mixed ischemic and nonischemic cardiomyopathy, pulmonary hypertension, chronic kidney disease, hypertension, hyperlipidemia, and Crohn's disease status post colectomy with ostomy, presenting for follow-up of HFrEF and coronary artery disease.  I last saw him in April, at which time he was not feeling well.  He noted low blood pressures and frequent dizziness in the setting of increased ostomy output.  Furosemide has been placed on hold.  He even required IV fluids in the emergency department.  We agreed to decrease carvedilol and Entresto.  I also encouraged him to see GI for further workup of his high ostomy output in the setting of Crohn's disease.  He has subsequently followed with the advanced heart failure clinic and underwent right and left heart catheterization in May that showed stable multivessel CAD without interventional targets and normal filling pressures.  He last saw Dr. Shirlee Latch 2 days ago and seem to be doing a little bit better.  Spironolactone was increased to 25 mg daily.  ROS: See HPI  Studies Reviewed: .        *** Risk Assessment/Calculations:   {Does this patient have ATRIAL FIBRILLATION?:[phone]}         Physical Exam:   VS:  There were no vitals taken for this visit.   Wt Readings from Last 3 Encounters:  01/05/23 232 lb 3.2 oz (105.3 kg)  01/04/23 234 lb (106.1 kg)  12/28/22 230 lb (104.3 kg)    General:  NAD. Neck: No JVD or HJR. Lungs: Clear to auscultation bilaterally without wheezes or crackles. Heart: Regular rate and rhythm  without murmurs, rubs, or gallops. Abdomen: Soft, nontender, nondistended. Extremities: No lower extremity edema.  ASSESSMENT AND PLAN: .    ***    {Are you ordering a CV Procedure (e.g. stress test, cath, DCCV, TEE, etc)?   Press F2        :[id number]}  Dispo: ***  Signed, Yvonne Kendall, MD

## 2023-01-07 ENCOUNTER — Encounter: Payer: Self-pay | Admitting: Internal Medicine

## 2023-01-10 NOTE — Progress Notes (Signed)
This encounter was created in error - please disregard.

## 2023-01-13 ENCOUNTER — Telehealth: Payer: Self-pay

## 2023-01-13 ENCOUNTER — Ambulatory Visit
Admission: RE | Admit: 2023-01-13 | Discharge: 2023-01-13 | Disposition: A | Discharge status: Home / Self Care | Payer: Medicaid Other | Source: Ambulatory Visit | Attending: Cardiology | Admitting: Cardiology

## 2023-01-13 DIAGNOSIS — I5022 Chronic systolic (congestive) heart failure: Secondary | ICD-10-CM | POA: Insufficient documentation

## 2023-01-13 DIAGNOSIS — I739 Peripheral vascular disease, unspecified: Secondary | ICD-10-CM | POA: Diagnosis not present

## 2023-01-13 NOTE — Telephone Encounter (Signed)
Spoke with pt to remind him of his [place] of lower ext. Pt aware, agreeable, and verbalized understanding

## 2023-01-14 ENCOUNTER — Other Ambulatory Visit: Payer: Self-pay | Admitting: Cardiology

## 2023-01-15 ENCOUNTER — Telehealth (HOSPITAL_COMMUNITY): Payer: Self-pay

## 2023-01-15 NOTE — Telephone Encounter (Signed)
Spoke with patient regarding the following results. Patient made aware and patient verbalized understanding.   Patient reports that he is still having slight swelling/numbness at times. Patient reports that he has lasix 20mg  PRN but states that he was told previously not to take it. Patient recently started spiro and reports compliance with all medications. Patient does not weigh daily. Advised patient to keep an eye on weight, and weigh at the same time every day. Advised patient I would check with APP about this.   Spoke with Jessica,NP- okay for patient to take lasix as needed. Patient aware and verbalized understanding.

## 2023-01-15 NOTE — Telephone Encounter (Signed)
-----   Message from Marca Ancona sent at 01/14/2023  9:14 PM EDT ----- Normal peripheral artery dopplers.

## 2023-01-29 ENCOUNTER — Other Ambulatory Visit (HOSPITAL_COMMUNITY): Payer: Self-pay

## 2023-01-29 ENCOUNTER — Encounter: Payer: Medicaid Other | Admitting: Family

## 2023-01-31 NOTE — Progress Notes (Signed)
This encounter was created in error - please disregard. No show

## 2023-02-14 ENCOUNTER — Encounter: Payer: Self-pay | Admitting: Family

## 2023-02-14 DIAGNOSIS — I5022 Chronic systolic (congestive) heart failure: Secondary | ICD-10-CM

## 2023-02-15 NOTE — Telephone Encounter (Signed)
Spoke with patient on telephone.   He reports he was feeling stressed over health issues with his mother, and woke with chest pain which prompted him to check his blood pressure and he found blood pressure to be high. He reports that he took his medication and pain resolved and blood pressure returned to normal. He denies any other  concerns at this time. He states he has an appointment with his cardiologist in a few days and will follow up with him.  Appointment scheduled with Dr. Shirlee Latch for 11/24', per last office visit note. Encouraged pt to call our office if any further symptoms or needs arise.

## 2023-02-19 ENCOUNTER — Ambulatory Visit: Payer: Medicaid Other | Attending: Internal Medicine | Admitting: Internal Medicine

## 2023-02-19 NOTE — Progress Notes (Deleted)
Cardiology Office Note:  .   Date:  02/19/2023  ID:  Clarence [name], DOB 1970-05-25, MRN [id number] PCP: Caesar Bookman, NP  [facility] Providers Cardiologist:  Yvonne Kendall, MD Advanced Heart Failure:  Marca Ancona, MD { Click to update primary MD,subspecialty MD or APP then REFRESH:1}    History of Present Illness: .   Clarence [name] is a 52 y.o. male with history of CAD, mixed ischemic and nonischemic cardiomyopathy, chronic HFrEF (35 to 40%, May 2023), pulmonary arterial hypertension, stage II chronic kidney disease, hypertension, hyperlipidemia, and Crohn's disease status post colectomy with ostomy, who presents for follow-up of coronary artery disease and heart failure.  I last saw him in April, at which time he was not feeling very well.  He was bothered by low blood pressures in the setting of increased ostomy output.  He had to be seen in the ED shortly before our visit due to dehydration treated with IV fluids.  His furosemide was on hold.  He also complained of "bad acid reflux" that was unresponsive to pantoprazole.  He was also worried about a parastomal hernia around his right sided ostomy.  We agreed to decrease carvedilol and Entresto due to symptomatic hypotension.  I encouraged him to reestablish with his gastroenterologist given his history of Crohn's disease and increasing ostomy output.  He has subsequently followed with Dr. Shirlee Latch in the heart failure clinic, having last been seen in mid August.  He reported feeling better but still continued to have mild exertional dyspnea as well as occasional atypical chest pain.  Carvedilol, Entresto, and empagliflozin were continued; spironolactone was increased.  ROS: See HPI  Studies Reviewed: .        *** Risk Assessment/Calculations:   {Does this patient have ATRIAL FIBRILLATION?:[phone]} No BP recorded.  {Refresh Note OR Click here to enter BP  :1}***       Physical Exam:   VS:  There were no vitals taken  for this visit.   Wt Readings from Last 3 Encounters:  01/05/23 232 lb 3.2 oz (105.3 kg)  01/04/23 234 lb (106.1 kg)  12/28/22 230 lb (104.3 kg)    General:  NAD. Neck: No JVD or HJR. Lungs: Clear to auscultation bilaterally without wheezes or crackles. Heart: Regular rate and rhythm without murmurs, rubs, or gallops. Abdomen: Soft, nontender, nondistended. Extremities: No lower extremity edema.  ASSESSMENT AND PLAN: .    ***    {Are you ordering a CV Procedure (e.g. stress test, cath, DCCV, TEE, etc)?   Press F2        :[id number]}  Dispo: ***  Signed, Yvonne Kendall, MD

## 2023-02-23 ENCOUNTER — Telehealth: Payer: Self-pay

## 2023-02-23 ENCOUNTER — Emergency Department: Payer: Medicaid Other

## 2023-02-23 ENCOUNTER — Other Ambulatory Visit: Payer: Self-pay

## 2023-02-23 ENCOUNTER — Emergency Department
Admission: EM | Admit: 2023-02-23 | Discharge: 2023-02-23 | Disposition: A | Discharge status: Home / Self Care | Payer: Medicaid Other | Attending: Emergency Medicine | Admitting: Emergency Medicine

## 2023-02-23 DIAGNOSIS — R0789 Other chest pain: Secondary | ICD-10-CM | POA: Diagnosis not present

## 2023-02-23 DIAGNOSIS — I130 Hypertensive heart and chronic kidney disease with heart failure and stage 1 through stage 4 chronic kidney disease, or unspecified chronic kidney disease: Secondary | ICD-10-CM | POA: Diagnosis not present

## 2023-02-23 DIAGNOSIS — R079 Chest pain, unspecified: Secondary | ICD-10-CM | POA: Insufficient documentation

## 2023-02-23 DIAGNOSIS — I509 Heart failure, unspecified: Secondary | ICD-10-CM | POA: Insufficient documentation

## 2023-02-23 DIAGNOSIS — I2510 Atherosclerotic heart disease of native coronary artery without angina pectoris: Secondary | ICD-10-CM | POA: Insufficient documentation

## 2023-02-23 DIAGNOSIS — N189 Chronic kidney disease, unspecified: Secondary | ICD-10-CM | POA: Diagnosis not present

## 2023-02-23 LAB — HEPATIC FUNCTION PANEL
ALT: 32 U/L (ref 0–44)
AST: 25 U/L (ref 15–41)
Albumin: 3.8 g/dL (ref 3.5–5.0)
Alkaline Phosphatase: 72 U/L (ref 38–126)
Bilirubin, Direct: 0.2 mg/dL (ref 0.0–0.2)
Indirect Bilirubin: 0.6 mg/dL (ref 0.3–0.9)
Total Bilirubin: 0.8 mg/dL (ref 0.3–1.2)
Total Protein: 6.9 g/dL (ref 6.5–8.1)

## 2023-02-23 LAB — CBC
HCT: 40.5 % (ref 39.0–52.0)
Hemoglobin: 13.8 g/dL (ref 13.0–17.0)
MCH: 27.9 pg (ref 26.0–34.0)
MCHC: 34.1 g/dL (ref 30.0–36.0)
MCV: 82 fL (ref 80.0–100.0)
Platelets: 212 10*3/uL (ref 150–400)
RBC: 4.94 MIL/uL (ref 4.22–5.81)
RDW: 13.3 % (ref 11.5–15.5)
WBC: 7.2 10*3/uL (ref 4.0–10.5)
nRBC: 0 % (ref 0.0–0.2)

## 2023-02-23 LAB — BASIC METABOLIC PANEL
Anion gap: 10 (ref 5–15)
BUN: 25 mg/dL — ABNORMAL HIGH (ref 6–20)
CO2: 24 mmol/L (ref 22–32)
Calcium: 9.3 mg/dL (ref 8.9–10.3)
Chloride: 105 mmol/L (ref 98–111)
Creatinine, Ser: 1.79 mg/dL — ABNORMAL HIGH (ref 0.61–1.24)
GFR, Estimated: 44 mL/min — ABNORMAL LOW (ref >60)
Glucose, Bld: 135 mg/dL — ABNORMAL HIGH (ref 70–99)
Potassium: 3.9 mmol/L (ref 3.5–5.1)
Sodium: 139 mmol/L (ref 135–145)

## 2023-02-23 LAB — TROPONIN I (HIGH SENSITIVITY)
Troponin I (High Sensitivity): 26 ng/L — ABNORMAL HIGH (ref <18)
Troponin I (High Sensitivity): 29 ng/L — ABNORMAL HIGH (ref <18)

## 2023-02-23 LAB — BRAIN NATRIURETIC PEPTIDE: B Natriuretic Peptide: 64.4 pg/mL (ref 0.0–100.0)

## 2023-02-23 MED ORDER — ASPIRIN 81 MG PO CHEW
324.0000 mg | CHEWABLE_TABLET | Freq: Once | ORAL | Status: AC
  Administered 2023-02-23: 324 mg via ORAL
  Filled 2023-02-23: qty 4

## 2023-02-23 MED ORDER — NITROGLYCERIN 0.4 MG SL SUBL
0.4000 mg | SUBLINGUAL_TABLET | SUBLINGUAL | Status: DC | PRN
  Administered 2023-02-23: 0.4 mg via SUBLINGUAL
  Filled 2023-02-23: qty 1

## 2023-02-23 NOTE — Discharge Instructions (Signed)
You were seen in the emergency department for chest pain.  You had 2 heart enzymes were at your normal, do not believe that you are having a big heart attack today.  Your EKG was at your normal.  It is importantly follow-up closely with your cardiologist, go to your scheduled appointment on the 10th.  Return to the emergency department if your chest pain returns.  Follow-up closely with your primary care physician and discuss stress and anxiety.  Thank you for choosing [place] for your health care, it was my pleasure to care for you today!  Corena Herter, MD

## 2023-02-23 NOTE — ED Triage Notes (Signed)
Pt presents to ED with c/o of CP that radiates to L shoulder that started 15 minutes ago PTA. Pt states HX of CHF, denies new swelling. NAD noted.

## 2023-02-23 NOTE — Telephone Encounter (Signed)
Called pt regarding below message. Pt stated his mother is currently in a rehab facility and for the past month as he walks down the long hall and out to his car, he develops chest pain that he describes feels like pressure. Pt stated symptoms occurs every few days. He denies any other symptoms but reported he did feel dizzy yesterday. No vitals to report.  Appointment scheduled for 02/25/23 for further evaluations Pt advised to report to ER if symptoms reoccur Pt verbalized understanding.     Clarence [name]  P Cv Div Burl Scheduling (supporting Mychart, Generic)2 hours ago (12:22 PM)    I apologize about my no shows....  been with my mom at the nursing rehab facility in [place],  running errands for her. Haven't had the opportunity to take care of me.  I've  been takin my meds in the morning. By the time I leave the facility it's roughly 10pm by then  I'm having serious chest pain. Makes it hard to walk to my vehicle.  Later today I'll go home keep my evening meds with me.

## 2023-02-23 NOTE — ED Provider Notes (Signed)
[facility] Provider Note    Event Date/Time   First MD Initiated Contact with Patient 02/23/23 1723     (approximate)   History   Chest Pain   HPI  Clarence [name] is a 52 y.o. male past medical history significant for CAD, CHF, Crohn's disease with colectomy, CKD, hypertension, who presents to the emergency department with shortness of breath and chest pain.  States that he got back from visiting his mother at [facility] whenever he started having chest pain.  Felt like his heart was fluttering and did not feel right so he came to the emergency department.  Complaining of left-sided chest pain that he describes as a pressure sensation that radiates to his left arm.  No association with nausea, vomiting or diaphoresis.  Does endorse shortness of breath.  Followed by cardiology and with the heart failure clinic.  Some changes of his home medications recently.  States that he has been compliant with his home medications.  Denies any cough or fever.  Denies any history of DVT or PE.     Physical Exam   Triage Vital Signs: ED Triage Vitals  Encounter Vitals Group     BP 02/23/23 1717 (!) 129/90     Systolic BP Percentile --      Diastolic BP Percentile --      Pulse Rate 02/23/23 1717 79     Resp 02/23/23 1717 18     Temp 02/23/23 1717 98.2 F (36.8 C)     Temp src --      SpO2 02/23/23 1717 95 %     Weight 02/23/23 1718 233 lb (105.7 kg)     Height 02/23/23 1718 5\' 10"  (1.778 m)     Head Circumference --      Peak Flow --      Pain Score 02/23/23 1717 4     Pain Loc --      Pain Education --      Exclude from Growth Chart --     Most recent vital signs: Vitals:   02/23/23 1900 02/23/23 1930  BP: 114/82 (!) 137/99  Pulse: 64 (!) 58  Resp: (!) 25 18  Temp:    SpO2: 96% 98%    Physical Exam Constitutional:      Appearance: He is well-developed.  HENT:     Head: Atraumatic.  Eyes:     Conjunctiva/sclera: Conjunctivae normal.   Cardiovascular:     Rate and Rhythm: Regular rhythm.     Heart sounds: Murmur heard.  Pulmonary:     Effort: No respiratory distress.  Chest:     Chest wall: No edema.  Abdominal:     Palpations: Abdomen is soft.     Comments: Colostomy with bag with no blood or melena noted  Musculoskeletal:     Cervical back: Normal range of motion.     Right lower leg: No edema.     Left lower leg: No edema.  Skin:    General: Skin is warm.     Capillary Refill: Capillary refill takes less than 2 seconds.  Neurological:     Mental Status: He is alert. Mental status is at baseline.     IMPRESSION / MDM / ASSESSMENT AND PLAN / ED COURSE  I reviewed the triage vital signs and the nursing notes.  Differential diagnosis including heart failure, pneumonia, ACS, anemia  EKG  I, Corena Herter, the attending physician, personally viewed and interpreted this ECG.  Rate: Normal  Rhythm: Normal sinus  Intervals: Normal  ST&T Change: T wave inverted to the lateral leads.  Nonspecific ST changes.  No significant change when compared to prior EKG  No tachycardic or bradycardic dysrhythmias while on cardiac telemetry.  RADIOLOGY I independently reviewed imaging, my interpretation of imaging: Chest x-ray without findings of pneumonia or focal findings of pulmonary edema.  LABS (all labs ordered are listed, but only abnormal results are displayed) Labs interpreted as -    Labs Reviewed  BASIC METABOLIC PANEL - Abnormal; Notable for the following components:      Result Value   Glucose, Bld 135 (*)    BUN 25 (*)    Creatinine, Ser 1.79 (*)    GFR, Estimated 44 (*)    All other components within normal limits  TROPONIN I (HIGH SENSITIVITY) - Abnormal; Notable for the following components:   Troponin I (High Sensitivity) 26 (*)    All other components within normal limits  TROPONIN I (HIGH SENSITIVITY) - Abnormal; Notable for the following components:   Troponin I (High Sensitivity) 29 (*)     All other components within normal limits  CBC  BRAIN NATRIURETIC PEPTIDE  HEPATIC FUNCTION PANEL     MDM  Patient was given aspirin and nitroglycerin.  Chest x-ray with no signs of heart failure.  Initial set of lab work is at the patient's baseline.  No signs of anemia.  Creatinine at baseline.  No significant electrolyte abnormalities.  Initial troponin is elevated but is at his baseline.  8:59 PM On reevaluation patient is currently chest pain-free.  States he is feeling much better.  Second troponin obtained.  On reevaluation second troponin is at his baseline.  Remains chest pain-free.   On chart review patient had a cardiac catheterization in May of this year and had ongoing findings of coronary artery disease but with good collaterals and recommended medical management, no significant change in his heart catheterization.  States that he is feeling better and this is more similar to his daily chest pain that he gets.  States that his cardiologist knows about this pain.  States that he has a follow-up appointment on the 10th and he will be able to make this visit.  States that he would return to the emergency department for any worsening or return of chest pain.  Discussed following up with his primary care physician about stress and anxiety.     PROCEDURES:  Critical Care performed: No  Procedures  Patient's presentation is most consistent with acute presentation with potential threat to life or bodily function.   MEDICATIONS ORDERED IN ED: Medications  nitroGLYCERIN (NITROSTAT) SL tablet 0.4 mg (0.4 mg Sublingual Given 02/23/23 1812)  aspirin chewable tablet 324 mg (324 mg Oral Given 02/23/23 1812)    FINAL CLINICAL IMPRESSION(S) / ED DIAGNOSES   Final diagnoses:  Chest pain, unspecified type     Rx / DC Orders   ED Discharge Orders     None        Note:  This document was prepared using Dragon voice recognition software and may include unintentional  dictation errors.   Corena Herter, MD 02/23/23 2100

## 2023-02-25 ENCOUNTER — Ambulatory Visit: Payer: Medicaid Other | Attending: Cardiology | Admitting: Cardiology

## 2023-02-25 ENCOUNTER — Encounter: Payer: Self-pay | Admitting: Cardiology

## 2023-02-25 VITALS — BP 107/76 | HR 59 | Ht 70.0 in | Wt 237.4 lb

## 2023-02-25 DIAGNOSIS — I10 Essential (primary) hypertension: Secondary | ICD-10-CM

## 2023-02-25 DIAGNOSIS — I25118 Atherosclerotic heart disease of native coronary artery with other forms of angina pectoris: Secondary | ICD-10-CM | POA: Diagnosis not present

## 2023-02-25 DIAGNOSIS — E782 Mixed hyperlipidemia: Secondary | ICD-10-CM

## 2023-02-25 DIAGNOSIS — N183 Chronic kidney disease, stage 3 unspecified: Secondary | ICD-10-CM

## 2023-02-25 DIAGNOSIS — I5022 Chronic systolic (congestive) heart failure: Secondary | ICD-10-CM | POA: Diagnosis not present

## 2023-02-25 DIAGNOSIS — I5042 Chronic combined systolic (congestive) and diastolic (congestive) heart failure: Secondary | ICD-10-CM

## 2023-02-25 NOTE — H&P (View-Only) (Signed)
Cardiology Office Note:  .   Date:  02/25/2023  ID:  Clarence [name], DOB Apr 24, 1971, MRN [id number] PCP: Caesar Bookman, NP  [facility] Providers Cardiologist:  Yvonne Kendall, MD Advanced Heart Failure:  Marca Ancona, MD    History of Present Illness: .   Clarence [name] is a 52 y.o. male with a past medical history of coronary artery disease, mixed ischemic and nonischemic cardiomyopathy, chronic HFrEF (EF 35-40% in May 2020), pulmonary arterial hypertension, stage II chronic kidney disease, hypertension, hyperlipidemia, Graves' disease s/p colectomy with ostomy, who presents for follow-up of recent emergency department visit.  He was initially admitted in March 2023 with progressive dyspnea over the preceding several months and was found to have a new cardiomyopathy with atypical pneumonia.  High-sensitivity troponin peaked at 89.  Echo showed EF of 30-35% with global hypokinesis and G2 DD.  He was diuresed and GDMT was initiated.  He subsequently underwent right and left heart catheterization, which revealed an occluded RCA, small severe OM1 disease, moderate LAD and circumflex disease and left-to-right collaterals of the RPDA.  He had elevated filling pressures with moderate to severe pulmonary hypertension and moderately reduced cardiac output index.  Clinical picture was consistent with mixed ischemic and nonischemic cardiomyopathy and he was medically managed.  He was readmitted in May 2023 with chest pain and hypertensive urgency.  Repeat echo showed an EF of 35-40%.  Renal artery duplex was concerning for possible right renal artery stenosis.  CT of the abdomen and pelvis showed no acute abnormality or significant renal artery stenosis.  He was admitted in June 2023 with hypertensive urgency and demand ischemia was medically managed.  He quit smoking 3/23 admission.  Rarely drinks EtOH denies any drug use.  No strong family history of CAD or cardiomyopathy.  Prior sleep study  was completed that was not suggestive of OSA.  He was admitted in 4/24 with chest pain with no evidence of ACS.  Hands and severe watery diarrhea VS ostomy was dehydrated.  Meds were held.  Echo on 4/24 was improving showing EF 45-50%, inferior and inferior lateral hypokinesis and normal RV.  He continued chest pain episodes and had R/LHC that was done 5/24 showing occluded DM RCA with collaterals, 90% stenosis with severe diffuse disease OM1.  50% mLAD, 60% proximal to mid left circumflex, normal filling pressures and preserved cardiac output.  He presented again to the emergency room 11/06/2019 with atypical chest pain, negative workup.  Was in the ER again 11/10/2022 with lightheadedness.  Blood pressure was stable in the ER but patient stated systolic blood pressure was low in the 80s at home.  Blood pressure medications were adjusted.  He was last seen in clinic 01/04/2023 by advanced heart failure.  Weight was up 7 pounds and he had completed pulmonary rehab.  Reds clip was 33%.  He was continued on GDMT with spironolactone increased to 25 mg at bedtime with labs scheduled in 2 weeks.  He was evaluated in the [facility] emergency department on 02/23/2023 with complaint of chest pain.  Complaining of left-sided chest pain that he described as a pressure sensation that radiated into his left arm.  No associated symptoms.  Stated he had been compliant with all of his medications.  Current blood pressure 129/90, pulse of 79, respirations of 18, temperature 98.2.  Pertinent labs revealed blood glucose 135, serum creatinine 1.79 with a BUN of 25, high-sensitivity troponin 26 and 29 flat.  He was given aspirin and  sublingual nitro.  With improvement in symptoms he was able to be discharged home with close outpatient follow-up.  He returns to clinic today with continued complaints of chest discomfort.  He said that he has been having chest discomfort since prior to the emergency department visit for the last month. His  mother is in rehab at [facility] and walking down the hallway from her room after his truck, he would have to sit for 4-5 minutes and wait for the chest pain to subside.  He states that the pain is located substernal and causes numbness into his neck left shoulder, and down his left arm.  He states that he has been compliant with his medications.  Has had several episodes of where chest discomfort has woken him in the night and he was noted to have blood pressures of 150s/110.  He continues to have associated shortness of breath and palpitations as well as fatigue and occasional swelling with the chest discomfort.  He is continue to follow with advanced heart failure.  He does note he has been under an increased amount of stress as his mother has been extremely sick  ROS: 10 point review of systems has been reviewed and considered negative with exception of what is been listed in the HPI  Studies Reviewed: Marland Kitchen   EKG Interpretation Date/Time:  Thursday February 25 2023 10:04:22 EDT Ventricular Rate:  59 PR Interval:  180 QRS Duration:  112 QT Interval:  420 QTC Calculation: 415 R Axis:   -15  Text Interpretation: Sinus bradycardia Minimal voltage criteria for LVH, may be normal variant ( Cornell product ) Possible Lateral infarct (cited on or before 10-Sep-2022) Inferior infarct (cited on or before 10-Sep-2022) When compared with ECG of 23-Feb-2023 17:21, T wave inversion more evident in Lateral leads Confirmed by Charlsie Quest ([id number]) on 02/25/2023 10:11:16 AM   R/LHC 09/29/2022   Mid RCA lesion is 100% stenosed.   2nd Diag lesion is 40% stenosed.   1st Mrg lesion is 90% stenosed.   Mid LAD lesion is 50% stenosed.   Prox Cx to Mid Cx lesion is 60% stenosed.   1. Normal filling pressures.  2. Preserved cardiac output.  3. Coronaries unchanged from prior study.  Has CTO RCA with collaterals.  There is severe, diffuse disease of OM1 with nonobstructive disease in the LAD and LCx.  Continue medical  management.   TTE 09/11/22 1. Left ventricular ejection fraction, by estimation, is 45 to 50%. The  left ventricle has mildly decreased function. The left ventricle  demonstrates regional wall motion abnormalities (hypokinesis of the  inferior and inferolateral wall). There is  moderate left ventricular hypertrophy. Left ventricular diastolic  parameters are consistent with Grade I diastolic dysfunction (impaired  relaxation).   2. Right ventricular systolic function is normal. The right ventricular  size is normal. Tricuspid regurgitation signal is inadequate for assessing  PA pressure.   3. The mitral valve is normal in structure. No evidence of mitral valve  regurgitation. No evidence of mitral stenosis.   4. The aortic valve is tricuspid. Aortic valve regurgitation is not  visualized. No aortic stenosis is present.   5. The inferior vena cava is normal in size with greater than 50%  respiratory variability, suggesting right atrial pressure of 3 mmHg.   Risk Assessment/Calculations:             Physical Exam:   VS:  BP 107/76 (BP Location: Left Arm, Patient Position: Sitting, Cuff Size: Large)   Pulse Marland Kitchen)  59   Ht 5\' 10"  (1.778 m)   Wt 237 lb 6.4 oz (107.7 kg)   SpO2 95%   BMI 34.06 kg/m    Wt Readings from Last 3 Encounters:  02/25/23 237 lb 6.4 oz (107.7 kg)  02/23/23 233 lb (105.7 kg)  01/05/23 232 lb 3.2 oz (105.3 kg)    GEN: Well nourished, well developed in no acute distress NECK: No JVD; No carotid bruits CARDIAC: RRR, no murmurs, rubs, gallops RESPIRATORY:  Clear to auscultation without rales, wheezing or rhonchi  ABDOMEN: Soft, non-tender, non-distended EXTREMITIES:  No edema; No deformity   ASSESSMENT AND PLAN: .   Coronary artery disease with worsening chest discomfort even with antianginal therapy and EKG changes today with ST changes and further T wave inversions.  Patient was recently evaluated in the emergency department with minimally elevated  high-sensitivity troponins.  His last heart catheterization was done in 09/2022 with CTO of the mid RCA, secondary to lesion 40% stenosed, first marginal lesion 90% stenosis, mid LAD lesion 50% stenosis, proximal circumflex to mid circumflex lesion 60% stenosis.-Prior studies revealed CTO of RCA with collaterals, severe diffuse disease of OM1 with nonobstructive disease in the LAD and left circumflex.  He was continued on medical management at that time.  With continued symptoms changes in EKG he has been scheduled for repeat left heart catheterization to reevaluate any progression of the stenosis.  He has also been continued on aspirin 81 mg daily, clopidogrel 75 mg daily, atorvastatin 80 mg daily, Imdur 60 mg daily, and Ranexa 250 mg 4 times daily.  Chronic HFrEF/ischemic and nonischemic cardiomyopathy with NYHA class II-III heart failure symptoms.  He continues to follow with advanced heart failure.  He is continued on carvedilol 6.25 mg twice daily, Jardiance 10 mg daily, and 20 mg as needed for weight gain Entresto 24/26 mg twice daily and Aldactone 25 mg daily.  He has been encouraged to continue to monitor his weight limit his fluid intake and his sodium intake.  Hypertension with blood pressure today 107/76.  Blood pressures remain stable per review.  He states he does have spikes in his blood pressure at home when he wakes in the night of 150/110.  He has been continued on carvedilol, Lasix, Imdur, Ranexa, Entresto, and spironolactone.  He has been encouraged if able to monitor his blood pressures 1 to 2 hours postmedication ministration as well.  Mixed hyperlipidemia where he is continued on atorvastatin 80 mg daily, ezetimibe 10 mg daily, fenofibrate 48 mg daily.  LDL was 59 on 09/21/2022.  Stage III chronic kidney disease with a history of hyperkalemia.  Labs reviewed from the recent emergency department visit revealed potassium 3.9 and serum creatinine of 1.79 which is stable at patient's  baseline.     Informed Consent   Shared Decision Making/Informed Consent The risks [stroke (1 in 1000), death (1 in 1000), kidney failure [usually temporary] (1 in 500), bleeding (1 in 200), allergic reaction [possibly serious] (1 in 200)], benefits (diagnostic support and management of coronary artery disease) and alternatives of a cardiac catheterization were discussed in detail with Clarence [name] and he is willing to proceed.     Dispo: Patient to return to clinic to see MD/APP 2 to 3 weeks postprocedure or sooner if needed  Signed, Yang Rack, NP

## 2023-02-25 NOTE — Patient Instructions (Signed)
Medication Instructions:  Your physician recommends that you continue on your current medications as directed. Please refer to the Current Medication list given to you today.  *If you need a refill on your cardiac medications before your next appointment, please call your pharmacy*  Lab Work: Your provider would like for you to have following labs drawn today CBC & BMET.   If you have labs (blood work) drawn today and your tests are completely normal, you will receive your results only by: MyChart Message (if you have MyChart) OR A paper copy in the mail If you have any lab test that is abnormal or we need to change your treatment, we will call you to review the results.  Testing/Procedures:  [place] [facility] DEPT OF [place] [place] [address] [place] [address] Dept: [phone] Loc: [phone]  Clarence [name]  02/25/2023  You are scheduled for a Cardiac Catheterization on Tuesday, October 15 with Dr. Cristal Deer End.  1. Please arrive at the [facility] Entrance of [facility], [address], [place] [address] at 12:30 PM (This is 1 hour(s) prior to your procedure time).  Proceed to the Check-In Desk directly inside the entrance.  Procedure Parking: Use the entrance off of the [address] side of the hospital. Turn right upon entering and follow the driveway to parking that is directly in front of the Heart [facility] Vascular Center. There is no valet parking available at this entrance, however there is an awning directly in front of the Heart & Vascular Center for drop off/ pick up for patients.  Special note: Every effort is made to have your procedure done on time. Please understand that emergencies sometimes delay scheduled procedures.  2. Diet: Do not eat solid foods after midnight.  The patient may have clear liquids until 5am upon the day of the procedure.  3. Labs: You will  need to have blood drawn today  4. Medication instructions in preparation for your procedure:  HOLD morning of - furosemide (LASIX) 20 MG tablet HOLD morning of - spironolactone (ALDACTONE) 25 MG tablet HOLD morning of - empagliflozin (JARDIANCE) 10 MG TABS tablet   Contrast Allergy: No  On the morning of your procedure, take your Aspirin 81 mg and Plavix/Clopidogrel and any morning medicines NOT listed above.  You may use sips of water.  5. Plan to go home the same day, you will only stay overnight if medically necessary. 6. Bring a current list of your medications and current insurance cards. 7. You MUST have a responsible person to drive you home. 8. Someone MUST be with you the first 24 hours after you arrive home or your discharge will be delayed. 9. Please wear clothes that are easy to get on and off and wear slip-on shoes.  Thank you for allowing [place] to care for you!   -- [facility]   Follow-Up: At [facility], you and your health needs are our priority.  As part of our continuing mission to provide you with exceptional heart care, we have created designated Provider Care Teams.  These Care Teams include your primary Cardiologist (physician) and Advanced Practice Providers (APPs -  Physician Assistants and Nurse Practitioners) who all work together to provide you with the care you need, when you need it.  We recommend signing up for the patient portal called "MyChart".  Sign up information is provided on this  After Visit Summary.  MyChart is used to connect with patients for Virtual Visits (Telemedicine).  Patients are able to view lab/test results, encounter notes, upcoming appointments, etc.  Non-urgent messages can be sent to your provider as well.   To learn more about what you can do with MyChart, go to [email or web address].    Your next appointment:   2 - 3  week(s)  Provider:   You may see Yvonne Kendall, MD or one of the  following Advanced Practice Providers on your designated Care Team:   Nicolasa Ducking, NP Eula Listen, PA-C Cadence Fransico Michael, PA-C Charlsie Quest, NP    Other Instructions -None

## 2023-02-25 NOTE — Progress Notes (Signed)
Cardiology Office Note:  .   Date:  02/25/2023  ID:  [name], DOB Apr 24, 1971, MRN [id number] PCP: Caesar Bookman, NP  [facility] Providers Cardiologist:  Yvonne Kendall, MD Advanced Heart Failure:  Marca Ancona, MD    History of Present Illness: .   Clarence [name] is a 52 y.o. male with a past medical history of coronary artery disease, mixed ischemic and nonischemic cardiomyopathy, chronic HFrEF (EF 35-40% in May 2020), pulmonary arterial hypertension, stage II chronic kidney disease, hypertension, hyperlipidemia, Graves' disease s/p colectomy with ostomy, who presents for follow-up of recent emergency department visit.  He was initially admitted in March 2023 with progressive dyspnea over the preceding several months and was found to have a new cardiomyopathy with atypical pneumonia.  High-sensitivity troponin peaked at 89.  Echo showed EF of 30-35% with global hypokinesis and G2 DD.  He was diuresed and GDMT was initiated.  He subsequently underwent right and left heart catheterization, which revealed an occluded RCA, small severe OM1 disease, moderate LAD and circumflex disease and left-to-right collaterals of the RPDA.  He had elevated filling pressures with moderate to severe pulmonary hypertension and moderately reduced cardiac output index.  Clinical picture was consistent with mixed ischemic and nonischemic cardiomyopathy and he was medically managed.  He was readmitted in May 2023 with chest pain and hypertensive urgency.  Repeat echo showed an EF of 35-40%.  Renal artery duplex was concerning for possible right renal artery stenosis.  CT of the abdomen and pelvis showed no acute abnormality or significant renal artery stenosis.  He was admitted in June 2023 with hypertensive urgency and demand ischemia was medically managed.  He quit smoking 3/23 admission.  Rarely drinks EtOH denies any drug use.  No strong family history of CAD or cardiomyopathy.  Prior sleep study  was completed that was not suggestive of OSA.  He was admitted in 4/24 with chest pain with no evidence of ACS.  Hands and severe watery diarrhea VS ostomy was dehydrated.  Meds were held.  Echo on 4/24 was improving showing EF 45-50%, inferior and inferior lateral hypokinesis and normal RV.  He continued chest pain episodes and had R/LHC that was done 5/24 showing occluded DM RCA with collaterals, 90% stenosis with severe diffuse disease OM1.  50% mLAD, 60% proximal to mid left circumflex, normal filling pressures and preserved cardiac output.  He presented again to the emergency room 11/06/2019 with atypical chest pain, negative workup.  Was in the ER again 11/10/2022 with lightheadedness.  Blood pressure was stable in the ER but patient stated systolic blood pressure was low in the 80s at home.  Blood pressure medications were adjusted.  He was last seen in clinic 01/04/2023 by advanced heart failure.  Weight was up 7 pounds and he had completed pulmonary rehab.  Reds clip was 33%.  He was continued on GDMT with spironolactone increased to 25 mg at bedtime with labs scheduled in 2 weeks.  He was evaluated in the [facility] emergency department on 02/23/2023 with complaint of chest pain.  Complaining of left-sided chest pain that he described as a pressure sensation that radiated into his left arm.  No associated symptoms.  Stated he had been compliant with all of his medications.  Current blood pressure 129/90, pulse of 79, respirations of 18, temperature 98.2.  Pertinent labs revealed blood glucose 135, serum creatinine 1.79 with a BUN of 25, high-sensitivity troponin 26 and 29 flat.  He was given aspirin and  sublingual nitro.  With improvement in symptoms he was able to be discharged home with close outpatient follow-up.  He returns to clinic today with continued complaints of chest discomfort.  He said that he has been having chest discomfort since prior to the emergency department visit for the last month. His  mother is in rehab at [facility] and walking down the hallway from her room after his truck, he would have to sit for 4-5 minutes and wait for the chest pain to subside.  He states that the pain is located substernal and causes numbness into his neck left shoulder, and down his left arm.  He states that he has been compliant with his medications.  Has had several episodes of where chest discomfort has woken him in the night and he was noted to have blood pressures of 150s/110.  He continues to have associated shortness of breath and palpitations as well as fatigue and occasional swelling with the chest discomfort.  He is continue to follow with advanced heart failure.  He does note he has been under an increased amount of stress as his mother has been extremely sick  ROS: 10 point review of systems has been reviewed and considered negative with exception of what is been listed in the HPI  Studies Reviewed: Marland Kitchen   EKG Interpretation Date/Time:  Thursday February 25 2023 10:04:22 EDT Ventricular Rate:  59 PR Interval:  180 QRS Duration:  112 QT Interval:  420 QTC Calculation: 415 R Axis:   -15  Text Interpretation: Sinus bradycardia Minimal voltage criteria for LVH, may be normal variant ( Cornell product ) Possible Lateral infarct (cited on or before 10-Sep-2022) Inferior infarct (cited on or before 10-Sep-2022) When compared with ECG of 23-Feb-2023 17:21, T wave inversion more evident in Lateral leads Confirmed by Charlsie Quest ([id number]) on 02/25/2023 10:11:16 AM   R/LHC 09/29/2022   Mid RCA lesion is 100% stenosed.   2nd Diag lesion is 40% stenosed.   1st Mrg lesion is 90% stenosed.   Mid LAD lesion is 50% stenosed.   Prox Cx to Mid Cx lesion is 60% stenosed.   1. Normal filling pressures.  2. Preserved cardiac output.  3. Coronaries unchanged from prior study.  Has CTO RCA with collaterals.  There is severe, diffuse disease of OM1 with nonobstructive disease in the LAD and LCx.  Continue medical  management.   TTE 09/11/22 1. Left ventricular ejection fraction, by estimation, is 45 to 50%. The  left ventricle has mildly decreased function. The left ventricle  demonstrates regional wall motion abnormalities (hypokinesis of the  inferior and inferolateral wall). There is  moderate left ventricular hypertrophy. Left ventricular diastolic  parameters are consistent with Grade I diastolic dysfunction (impaired  relaxation).   2. Right ventricular systolic function is normal. The right ventricular  size is normal. Tricuspid regurgitation signal is inadequate for assessing  PA pressure.   3. The mitral valve is normal in structure. No evidence of mitral valve  regurgitation. No evidence of mitral stenosis.   4. The aortic valve is tricuspid. Aortic valve regurgitation is not  visualized. No aortic stenosis is present.   5. The inferior vena cava is normal in size with greater than 50%  respiratory variability, suggesting right atrial pressure of 3 mmHg.   Risk Assessment/Calculations:             Physical Exam:   VS:  BP 107/76 (BP Location: Left Arm, Patient Position: Sitting, Cuff Size: Large)   Pulse Marland Kitchen)  59   Ht 5\' 10"  (1.778 m)   Wt 237 lb 6.4 oz (107.7 kg)   SpO2 95%   BMI 34.06 kg/m    Wt Readings from Last 3 Encounters:  02/25/23 237 lb 6.4 oz (107.7 kg)  02/23/23 233 lb (105.7 kg)  01/05/23 232 lb 3.2 oz (105.3 kg)    GEN: Well nourished, well developed in no acute distress NECK: No JVD; No carotid bruits CARDIAC: RRR, no murmurs, rubs, gallops RESPIRATORY:  Clear to auscultation without rales, wheezing or rhonchi  ABDOMEN: Soft, non-tender, non-distended EXTREMITIES:  No edema; No deformity   ASSESSMENT AND PLAN: .   Coronary artery disease with worsening chest discomfort even with antianginal therapy and EKG changes today with ST changes and further T wave inversions.  Patient was recently evaluated in the emergency department with minimally elevated  high-sensitivity troponins.  His last heart catheterization was done in 09/2022 with CTO of the mid RCA, secondary to lesion 40% stenosed, first marginal lesion 90% stenosis, mid LAD lesion 50% stenosis, proximal circumflex to mid circumflex lesion 60% stenosis.-Prior studies revealed CTO of RCA with collaterals, severe diffuse disease of OM1 with nonobstructive disease in the LAD and left circumflex.  He was continued on medical management at that time.  With continued symptoms changes in EKG he has been scheduled for repeat left heart catheterization to reevaluate any progression of the stenosis.  He has also been continued on aspirin 81 mg daily, clopidogrel 75 mg daily, atorvastatin 80 mg daily, Imdur 60 mg daily, and Ranexa 250 mg 4 times daily.  Chronic HFrEF/ischemic and nonischemic cardiomyopathy with NYHA class II-III heart failure symptoms.  He continues to follow with advanced heart failure.  He is continued on carvedilol 6.25 mg twice daily, Jardiance 10 mg daily, and 20 mg as needed for weight gain Entresto 24/26 mg twice daily and Aldactone 25 mg daily.  He has been encouraged to continue to monitor his weight limit his fluid intake and his sodium intake.  Hypertension with blood pressure today 107/76.  Blood pressures remain stable per review.  He states he does have spikes in his blood pressure at home when he wakes in the night of 150/110.  He has been continued on carvedilol, Lasix, Imdur, Ranexa, Entresto, and spironolactone.  He has been encouraged if able to monitor his blood pressures 1 to 2 hours postmedication ministration as well.  Mixed hyperlipidemia where he is continued on atorvastatin 80 mg daily, ezetimibe 10 mg daily, fenofibrate 48 mg daily.  LDL was 59 on 09/21/2022.  Stage III chronic kidney disease with a history of hyperkalemia.  Labs reviewed from the recent emergency department visit revealed potassium 3.9 and serum creatinine of 1.79 which is stable at patient's  baseline.     Informed Consent   Shared Decision Making/Informed Consent The risks [stroke (1 in 1000), death (1 in 1000), kidney failure [usually temporary] (1 in 500), bleeding (1 in 200), allergic reaction [possibly serious] (1 in 200)], benefits (diagnostic support and management of coronary artery disease) and alternatives of a cardiac catheterization were discussed in detail with Mr. Veillette and he is willing to proceed.     Dispo: Patient to return to clinic to see MD/APP 2 to 3 weeks postprocedure or sooner if needed  Signed, Yang Rack, NP

## 2023-02-26 LAB — CBC
Hematocrit: 44.6 % (ref 37.5–51.0)
Hemoglobin: 14.8 g/dL (ref 13.0–17.7)
MCH: 28.6 pg (ref 26.6–33.0)
MCHC: 33.2 g/dL (ref 31.5–35.7)
MCV: 86 fL (ref 79–97)
Platelets: 232 10*3/uL (ref 150–450)
RBC: 5.17 x10E6/uL (ref 4.14–5.80)
RDW: 13.5 % (ref 11.6–15.4)
WBC: 6.5 10*3/uL (ref 3.4–10.8)

## 2023-02-26 LAB — BASIC METABOLIC PANEL
BUN/Creatinine Ratio: 12 (ref 9–20)
BUN: 19 mg/dL (ref 6–24)
CO2: 22 mmol/L (ref 20–29)
Calcium: 9.9 mg/dL (ref 8.7–10.2)
Chloride: 103 mmol/L (ref 96–106)
Creatinine, Ser: 1.57 mg/dL — ABNORMAL HIGH (ref 0.76–1.27)
Glucose: 122 mg/dL — ABNORMAL HIGH (ref 70–99)
Potassium: 4.8 mmol/L (ref 3.5–5.2)
Sodium: 142 mmol/L (ref 134–144)
eGFR: 52 mL/min/{1.73_m2} — ABNORMAL LOW (ref >59)

## 2023-02-26 MED ORDER — ISOSORBIDE MONONITRATE ER 60 MG PO TB24
90.0000 mg | ORAL_TABLET | Freq: Every day | ORAL | 3 refills | Status: DC
Start: 2023-02-26 — End: 2023-06-15

## 2023-02-26 NOTE — Addendum Note (Signed)
Addended by: Alexandra Posadas, Milagros Reap on: 02/26/2023 11:03 AM   Modules accepted: Orders

## 2023-03-01 ENCOUNTER — Other Ambulatory Visit: Payer: Self-pay

## 2023-03-01 ENCOUNTER — Other Ambulatory Visit (HOSPITAL_COMMUNITY): Payer: Self-pay

## 2023-03-01 ENCOUNTER — Other Ambulatory Visit: Payer: Self-pay | Admitting: Family

## 2023-03-01 MED ORDER — EMPAGLIFLOZIN 10 MG PO TABS
10.0000 mg | ORAL_TABLET | Freq: Every day | ORAL | 0 refills | Status: DC
Start: 2023-03-01 — End: 2023-03-09
  Filled 2023-03-01: qty 30, 30d supply, fill #0

## 2023-03-01 MED ORDER — CARVEDILOL 6.25 MG PO TABS
6.2500 mg | ORAL_TABLET | Freq: Two times a day (BID) | ORAL | 0 refills | Status: DC
  Filled 2023-03-01: qty 180, 90d supply, fill #0

## 2023-03-01 MED ORDER — ONDANSETRON HCL 4 MG PO TABS
4.0000 mg | ORAL_TABLET | Freq: Three times a day (TID) | ORAL | 0 refills | Status: DC | PRN
  Filled 2023-03-01: qty 20, 7d supply, fill #0

## 2023-03-01 NOTE — Telephone Encounter (Signed)
Patient requested refills. Pended and sent to [facility] for approval.

## 2023-03-01 NOTE — Progress Notes (Signed)
Kidney function remains at baseline. Blood counts are stable. No elevated white counts concerning for infection on pre-procedure labs.

## 2023-03-02 ENCOUNTER — Other Ambulatory Visit: Payer: Self-pay

## 2023-03-02 ENCOUNTER — Observation Stay
Admission: RE | Admit: 2023-03-02 | Discharge: 2023-03-03 | Disposition: A | Discharge status: Home / Self Care | Payer: Medicaid Other | Source: Home / Self Care | Attending: Internal Medicine | Admitting: Internal Medicine

## 2023-03-02 ENCOUNTER — Encounter
Admission: RE | Disposition: A | Discharge status: Home / Self Care | Payer: Self-pay | Source: Home / Self Care | Attending: Internal Medicine

## 2023-03-02 ENCOUNTER — Encounter: Payer: Self-pay | Admitting: Internal Medicine

## 2023-03-02 DIAGNOSIS — I25110 Atherosclerotic heart disease of native coronary artery with unstable angina pectoris: Principal | ICD-10-CM

## 2023-03-02 DIAGNOSIS — I5022 Chronic systolic (congestive) heart failure: Secondary | ICD-10-CM | POA: Insufficient documentation

## 2023-03-02 DIAGNOSIS — N183 Chronic kidney disease, stage 3 unspecified: Secondary | ICD-10-CM | POA: Insufficient documentation

## 2023-03-02 DIAGNOSIS — E782 Mixed hyperlipidemia: Secondary | ICD-10-CM | POA: Diagnosis present

## 2023-03-02 DIAGNOSIS — I130 Hypertensive heart and chronic kidney disease with heart failure and stage 1 through stage 4 chronic kidney disease, or unspecified chronic kidney disease: Secondary | ICD-10-CM | POA: Insufficient documentation

## 2023-03-02 DIAGNOSIS — R0789 Other chest pain: Secondary | ICD-10-CM | POA: Diagnosis not present

## 2023-03-02 DIAGNOSIS — I25118 Atherosclerotic heart disease of native coronary artery with other forms of angina pectoris: Principal | ICD-10-CM | POA: Diagnosis present

## 2023-03-02 DIAGNOSIS — R079 Chest pain, unspecified: Secondary | ICD-10-CM

## 2023-03-02 DIAGNOSIS — I10 Essential (primary) hypertension: Secondary | ICD-10-CM | POA: Diagnosis present

## 2023-03-02 DIAGNOSIS — I200 Unstable angina: Secondary | ICD-10-CM

## 2023-03-02 HISTORY — PX: CORONARY STENT INTERVENTION: CATH118234

## 2023-03-02 HISTORY — PX: LEFT HEART CATH AND CORONARY ANGIOGRAPHY: CATH118249

## 2023-03-02 LAB — GLUCOSE, CAPILLARY: Glucose-Capillary: 101 mg/dL — ABNORMAL HIGH (ref 70–99)

## 2023-03-02 LAB — POCT ACTIVATED CLOTTING TIME
Activated Clotting Time: 269 s
Activated Clotting Time: 287 s
Activated Clotting Time: 311 s

## 2023-03-02 SURGERY — LEFT HEART CATH AND CORONARY ANGIOGRAPHY
Anesthesia: Moderate Sedation

## 2023-03-02 MED ORDER — SODIUM CHLORIDE 0.9% FLUSH
3.0000 mL | Freq: Two times a day (BID) | INTRAVENOUS | Status: DC
  Administered 2023-03-02: 3 mL via INTRAVENOUS

## 2023-03-02 MED ORDER — ONDANSETRON HCL 4 MG/2ML IJ SOLN: 4.0000 mg | Freq: Four times a day (QID) | INTRAMUSCULAR | Status: DC | PRN

## 2023-03-02 MED ORDER — CARVEDILOL 6.25 MG PO TABS: 6.2500 mg | ORAL_TABLET | Freq: Two times a day (BID) | ORAL | Status: DC

## 2023-03-02 MED ORDER — ATORVASTATIN CALCIUM 80 MG PO TABS: 80.0000 mg | ORAL_TABLET | Freq: Every evening | ORAL | Status: DC

## 2023-03-02 MED ORDER — VERAPAMIL HCL 2.5 MG/ML IV SOLN
INTRAVENOUS | Status: AC
  Filled 2023-03-02: qty 2

## 2023-03-02 MED ORDER — EZETIMIBE 10 MG PO TABS
10.0000 mg | ORAL_TABLET | Freq: Every day | ORAL | Status: DC
  Administered 2023-03-03: 10 mg via ORAL
  Filled 2023-03-02: qty 1

## 2023-03-02 MED ORDER — IOHEXOL 300 MG/ML  SOLN
INTRAMUSCULAR | Status: DC | PRN
  Administered 2023-03-02: 72 mL

## 2023-03-02 MED ORDER — MIDAZOLAM HCL 2 MG/2ML IJ SOLN
INTRAMUSCULAR | Status: DC | PRN
  Administered 2023-03-02: 1 mg via INTRAVENOUS

## 2023-03-02 MED ORDER — PANTOPRAZOLE SODIUM 40 MG PO TBEC
40.0000 mg | DELAYED_RELEASE_TABLET | Freq: Every day | ORAL | Status: DC
  Administered 2023-03-03: 40 mg via ORAL
  Filled 2023-03-02: qty 1

## 2023-03-02 MED ORDER — HEPARIN SODIUM (PORCINE) 1000 UNIT/ML IJ SOLN
INTRAMUSCULAR | Status: AC
  Filled 2023-03-02: qty 10

## 2023-03-02 MED ORDER — CLOPIDOGREL BISULFATE 75 MG PO TABS
ORAL_TABLET | ORAL | Status: AC
  Filled 2023-03-02: qty 8

## 2023-03-02 MED ORDER — SODIUM CHLORIDE 0.9 % IV SOLN: INTRAVENOUS | Status: DC

## 2023-03-02 MED ORDER — RANOLAZINE ER 500 MG PO TB12
500.0000 mg | ORAL_TABLET | Freq: Two times a day (BID) | ORAL | Status: DC
  Administered 2023-03-02 – 2023-03-03 (×2): 500 mg via ORAL
  Filled 2023-03-02 (×2): qty 1

## 2023-03-02 MED ORDER — FENTANYL CITRATE (PF) 100 MCG/2ML IJ SOLN
INTRAMUSCULAR | Status: AC
  Filled 2023-03-02: qty 2

## 2023-03-02 MED ORDER — NITROGLYCERIN 1 MG/10 ML FOR IR/CATH LAB
INTRA_ARTERIAL | Status: DC | PRN
  Administered 2023-03-02 (×2): 200 ug via INTRACORONARY

## 2023-03-02 MED ORDER — SODIUM CHLORIDE 0.9 % IV SOLN: 250.0000 mL | INTRAVENOUS | Status: DC | PRN

## 2023-03-02 MED ORDER — HEPARIN (PORCINE) IN NACL 1000-0.9 UT/500ML-% IV SOLN
INTRAVENOUS | Status: DC | PRN
  Administered 2023-03-02 (×2): 500 mL

## 2023-03-02 MED ORDER — FENTANYL CITRATE (PF) 100 MCG/2ML IJ SOLN
INTRAMUSCULAR | Status: DC | PRN
  Administered 2023-03-02: 25 ug via INTRAVENOUS

## 2023-03-02 MED ORDER — ASPIRIN 81 MG PO TBEC
81.0000 mg | DELAYED_RELEASE_TABLET | Freq: Every day | ORAL | Status: DC
  Administered 2023-03-03: 81 mg via ORAL
  Filled 2023-03-02: qty 1

## 2023-03-02 MED ORDER — EMPAGLIFLOZIN 10 MG PO TABS
10.0000 mg | ORAL_TABLET | Freq: Every day | ORAL | Status: DC
  Administered 2023-03-03: 10 mg via ORAL
  Filled 2023-03-02: qty 1

## 2023-03-02 MED ORDER — SODIUM CHLORIDE 0.9 % IV SOLN: INTRAVENOUS | Status: AC

## 2023-03-02 MED ORDER — SPIRONOLACTONE 25 MG PO TABS
25.0000 mg | ORAL_TABLET | Freq: Every day | ORAL | Status: DC
  Administered 2023-03-03: 25 mg via ORAL
  Filled 2023-03-02: qty 1

## 2023-03-02 MED ORDER — ISOSORBIDE MONONITRATE ER 30 MG PO TB24
90.0000 mg | ORAL_TABLET | Freq: Every day | ORAL | Status: DC
  Administered 2023-03-03: 90 mg via ORAL
  Filled 2023-03-02: qty 3

## 2023-03-02 MED ORDER — VERAPAMIL HCL 2.5 MG/ML IV SOLN
INTRAVENOUS | Status: DC | PRN
  Administered 2023-03-02 (×2): 2.5 mg via INTRA_ARTERIAL

## 2023-03-02 MED ORDER — CLOPIDOGREL BISULFATE 75 MG PO TABS
75.0000 mg | ORAL_TABLET | Freq: Every day | ORAL | Status: DC
  Administered 2023-03-03: 75 mg via ORAL
  Filled 2023-03-02: qty 1

## 2023-03-02 MED ORDER — ENOXAPARIN SODIUM 60 MG/0.6ML IJ SOSY
50.0000 mg | PREFILLED_SYRINGE | INTRAMUSCULAR | Status: DC
  Administered 2023-03-03: 50 mg via SUBCUTANEOUS
  Filled 2023-03-02: qty 0.6

## 2023-03-02 MED ORDER — NITROGLYCERIN 1 MG/10 ML FOR IR/CATH LAB
INTRA_ARTERIAL | Status: AC
  Filled 2023-03-02: qty 10

## 2023-03-02 MED ORDER — NITROGLYCERIN 0.4 MG SL SUBL: 0.4000 mg | SUBLINGUAL_TABLET | SUBLINGUAL | Status: DC | PRN

## 2023-03-02 MED ORDER — SODIUM CHLORIDE 0.9% FLUSH: 3.0000 mL | INTRAVENOUS | Status: DC | PRN

## 2023-03-02 MED ORDER — SACUBITRIL-VALSARTAN 24-26 MG PO TABS
1.0000 | ORAL_TABLET | Freq: Two times a day (BID) | ORAL | Status: DC
  Administered 2023-03-02 – 2023-03-03 (×2): 1 via ORAL
  Filled 2023-03-02 (×2): qty 1

## 2023-03-02 MED ORDER — HEPARIN (PORCINE) IN NACL 1000-0.9 UT/500ML-% IV SOLN
INTRAVENOUS | Status: AC
  Filled 2023-03-02: qty 1000

## 2023-03-02 MED ORDER — MIDAZOLAM HCL 2 MG/2ML IJ SOLN
INTRAMUSCULAR | Status: AC
  Filled 2023-03-02: qty 2

## 2023-03-02 MED ORDER — ACETAMINOPHEN 325 MG PO TABS
650.0000 mg | ORAL_TABLET | ORAL | Status: DC | PRN
  Administered 2023-03-02: 650 mg via ORAL
  Filled 2023-03-02: qty 2

## 2023-03-02 MED ORDER — HEPARIN SODIUM (PORCINE) 1000 UNIT/ML IJ SOLN
INTRAMUSCULAR | Status: DC | PRN
  Administered 2023-03-02: 3000 [IU] via INTRAVENOUS
  Administered 2023-03-02: 6000 [IU] via INTRAVENOUS
  Administered 2023-03-02: 5000 [IU] via INTRAVENOUS
  Administered 2023-03-02: 2000 [IU] via INTRAVENOUS

## 2023-03-02 MED ORDER — HYDRALAZINE HCL 20 MG/ML IJ SOLN: 10.0000 mg | INTRAMUSCULAR | Status: AC | PRN

## 2023-03-02 MED ORDER — ASPIRIN 81 MG PO CHEW: 81.0000 mg | CHEWABLE_TABLET | ORAL | Status: DC

## 2023-03-02 MED ORDER — CLOPIDOGREL BISULFATE 75 MG PO TABS
ORAL_TABLET | ORAL | Status: DC | PRN
  Administered 2023-03-02: 600 mg via ORAL

## 2023-03-02 SURGICAL SUPPLY — 20 items
BALLN MINITREK RX 2.0X12 (BALLOONS) ×2
BALLN ~~LOC~~ EUPHORA RX 2.25X12 (BALLOONS) ×2
BALLOON MINITREK RX 2.0X12 (BALLOONS) IMPLANT
BALLOON ~~LOC~~ EUPHORA RX 2.25X12 (BALLOONS) IMPLANT
CATH INFINITI 5 FR JL3.5 (CATHETERS) IMPLANT
CATH INFINITI AMBI 5FR TG (CATHETERS) IMPLANT
CATH VISTA GUIDE 6FR XBLAD3.5 (CATHETERS) IMPLANT
DEVICE RAD TR BAND REGULAR (VASCULAR PRODUCTS) IMPLANT
DRAPE BRACHIAL (DRAPES) IMPLANT
GLIDESHEATH SLEND SS 6F .021 (SHEATH) IMPLANT
GUIDEWIRE INQWIRE 1.5J.035X260 (WIRE) IMPLANT
INQWIRE 1.5J .035X260CM (WIRE) ×2
KIT ENCORE 26 ADVANTAGE (KITS) IMPLANT
PACK CARDIAC CATH (CUSTOM PROCEDURE TRAY) ×2 IMPLANT
PROTECTION STATION PRESSURIZED (MISCELLANEOUS) ×2
SET ATX-X65L (MISCELLANEOUS) IMPLANT
STATION PROTECTION PRESSURIZED (MISCELLANEOUS) IMPLANT
STENT ONYX FRONTIER 2.25X34 (Permanent Stent) IMPLANT
WIRE RUNTHROUGH .014X180CM (WIRE) IMPLANT
WIRE RUNTHROUGH IZANAI 014 180 (WIRE) IMPLANT

## 2023-03-02 NOTE — Progress Notes (Signed)
PHARMACIST - PHYSICIAN COMMUNICATION  CONCERNING:  Enoxaparin (Lovenox) for DVT Prophylaxis    RECOMMENDATION: Patient was prescribed enoxaprin 40mg  q24 hours for VTE prophylaxis.   Filed Weights   03/02/23 1236  Weight: 107 kg (235 lb 14.4 oz)    Body mass index is 33.85 kg/m.  Estimated Creatinine Clearance: 65.9 mL/min (A) (by C-G formula based on SCr of 1.57 mg/dL (H)).   Based on [facility] policy patient is candidate for enoxaparin 0.5mg /kg TBW SQ every 24 hours based on BMI being >30.  DESCRIPTION: Pharmacy has adjusted enoxaparin dose per [facility] policy.  Patient is now receiving enoxaparin 0.5 mg/kg every 24 hours   Otelia Sergeant, PharmD, [facility] 03/02/2023 10:44 PM

## 2023-03-02 NOTE — Interval H&P Note (Signed)
History and Physical Interval Note:  03/02/2023 2:48 PM  [name]  has presented today for surgery, with the diagnosis of coronary artery disease with unstable angina.  The various methods of treatment have been discussed with the patient and family. After consideration of risks, benefits and other options for treatment, the patient has consented to  Procedure(s): LEFT HEART CATH AND CORONARY ANGIOGRAPHY (Left) as a surgical intervention.  The patient's history has been reviewed, patient examined, no change in status, stable for surgery.  I have reviewed the patient's chart and labs.  Questions were answered to the patient's satisfaction.    Cath Lab Visit (complete for each Cath Lab visit)  Clinical Evaluation Leading to the Procedure:   ACS: No.  Non-ACS:    Anginal Classification: CCS IV  Anti-ischemic medical therapy: Maximal Therapy (2 or more classes of medications)  Non-Invasive Test Results: No non-invasive testing performed  Prior CABG: No previous CABG  Clarence [name]

## 2023-03-03 ENCOUNTER — Other Ambulatory Visit: Payer: Self-pay

## 2023-03-03 ENCOUNTER — Emergency Department: Payer: Medicaid Other

## 2023-03-03 ENCOUNTER — Other Ambulatory Visit (HOSPITAL_COMMUNITY): Payer: Self-pay

## 2023-03-03 ENCOUNTER — Observation Stay
Admission: EM | Admit: 2023-03-03 | Discharge: 2023-03-04 | Disposition: A | Discharge status: Home / Self Care | Payer: Medicaid Other | Attending: Internal Medicine | Admitting: Internal Medicine

## 2023-03-03 ENCOUNTER — Encounter: Payer: Self-pay | Admitting: Internal Medicine

## 2023-03-03 DIAGNOSIS — I10 Essential (primary) hypertension: Secondary | ICD-10-CM

## 2023-03-03 DIAGNOSIS — I5022 Chronic systolic (congestive) heart failure: Secondary | ICD-10-CM | POA: Diagnosis not present

## 2023-03-03 DIAGNOSIS — E782 Mixed hyperlipidemia: Secondary | ICD-10-CM | POA: Diagnosis present

## 2023-03-03 DIAGNOSIS — Z5941 Food insecurity: Secondary | ICD-10-CM

## 2023-03-03 DIAGNOSIS — M7542 Impingement syndrome of left shoulder: Secondary | ICD-10-CM | POA: Diagnosis present

## 2023-03-03 DIAGNOSIS — I25118 Atherosclerotic heart disease of native coronary artery with other forms of angina pectoris: Secondary | ICD-10-CM | POA: Diagnosis not present

## 2023-03-03 DIAGNOSIS — I2510 Atherosclerotic heart disease of native coronary artery without angina pectoris: Secondary | ICD-10-CM

## 2023-03-03 DIAGNOSIS — Z7902 Long term (current) use of antithrombotics/antiplatelets: Secondary | ICD-10-CM

## 2023-03-03 DIAGNOSIS — Z79899 Other long term (current) drug therapy: Secondary | ICD-10-CM

## 2023-03-03 DIAGNOSIS — I202 Refractory angina pectoris: Secondary | ICD-10-CM | POA: Diagnosis not present

## 2023-03-03 DIAGNOSIS — Z8249 Family history of ischemic heart disease and other diseases of the circulatory system: Secondary | ICD-10-CM

## 2023-03-03 DIAGNOSIS — I272 Pulmonary hypertension, unspecified: Secondary | ICD-10-CM | POA: Diagnosis present

## 2023-03-03 DIAGNOSIS — Z7984 Long term (current) use of oral hypoglycemic drugs: Secondary | ICD-10-CM

## 2023-03-03 DIAGNOSIS — Z7982 Long term (current) use of aspirin: Secondary | ICD-10-CM

## 2023-03-03 DIAGNOSIS — K5090 Crohn's disease, unspecified, without complications: Secondary | ICD-10-CM | POA: Diagnosis present

## 2023-03-03 DIAGNOSIS — R0789 Other chest pain: Secondary | ICD-10-CM | POA: Diagnosis not present

## 2023-03-03 DIAGNOSIS — I25110 Atherosclerotic heart disease of native coronary artery with unstable angina pectoris: Principal | ICD-10-CM | POA: Diagnosis present

## 2023-03-03 DIAGNOSIS — Z9049 Acquired absence of other specified parts of digestive tract: Secondary | ICD-10-CM

## 2023-03-03 DIAGNOSIS — I255 Ischemic cardiomyopathy: Secondary | ICD-10-CM | POA: Diagnosis present

## 2023-03-03 DIAGNOSIS — I428 Other cardiomyopathies: Secondary | ICD-10-CM | POA: Diagnosis present

## 2023-03-03 DIAGNOSIS — R079 Chest pain, unspecified: Principal | ICD-10-CM | POA: Diagnosis present

## 2023-03-03 DIAGNOSIS — I130 Hypertensive heart and chronic kidney disease with heart failure and stage 1 through stage 4 chronic kidney disease, or unspecified chronic kidney disease: Secondary | ICD-10-CM | POA: Diagnosis present

## 2023-03-03 DIAGNOSIS — N179 Acute kidney failure, unspecified: Secondary | ICD-10-CM | POA: Insufficient documentation

## 2023-03-03 DIAGNOSIS — E785 Hyperlipidemia, unspecified: Secondary | ICD-10-CM | POA: Diagnosis present

## 2023-03-03 DIAGNOSIS — I200 Unstable angina: Secondary | ICD-10-CM

## 2023-03-03 DIAGNOSIS — E1122 Type 2 diabetes mellitus with diabetic chronic kidney disease: Secondary | ICD-10-CM | POA: Diagnosis present

## 2023-03-03 DIAGNOSIS — E0500 Thyrotoxicosis with diffuse goiter without thyrotoxic crisis or storm: Secondary | ICD-10-CM | POA: Diagnosis present

## 2023-03-03 DIAGNOSIS — Z87891 Personal history of nicotine dependence: Secondary | ICD-10-CM

## 2023-03-03 DIAGNOSIS — Z5986 Financial insecurity: Secondary | ICD-10-CM

## 2023-03-03 DIAGNOSIS — R7989 Other specified abnormal findings of blood chemistry: Secondary | ICD-10-CM | POA: Insufficient documentation

## 2023-03-03 DIAGNOSIS — I5042 Chronic combined systolic (congestive) and diastolic (congestive) heart failure: Secondary | ICD-10-CM | POA: Diagnosis present

## 2023-03-03 DIAGNOSIS — N1831 Chronic kidney disease, stage 3a: Secondary | ICD-10-CM | POA: Insufficient documentation

## 2023-03-03 DIAGNOSIS — N183 Chronic kidney disease, stage 3 unspecified: Secondary | ICD-10-CM | POA: Insufficient documentation

## 2023-03-03 DIAGNOSIS — I509 Heart failure, unspecified: Secondary | ICD-10-CM

## 2023-03-03 LAB — BASIC METABOLIC PANEL
Anion gap: 7 (ref 5–15)
Anion gap: 11 (ref 5–15)
BUN: 27 mg/dL — ABNORMAL HIGH (ref 6–20)
BUN: 23 mg/dL — ABNORMAL HIGH (ref 6–20)
CO2: 23 mmol/L (ref 22–32)
CO2: 25 mmol/L (ref 22–32)
Calcium: 8.8 mg/dL — ABNORMAL LOW (ref 8.9–10.3)
Calcium: 9.6 mg/dL (ref 8.9–10.3)
Chloride: 107 mmol/L (ref 98–111)
Chloride: 104 mmol/L (ref 98–111)
Creatinine, Ser: 1.56 mg/dL — ABNORMAL HIGH (ref 0.61–1.24)
Creatinine, Ser: 1.63 mg/dL — ABNORMAL HIGH (ref 0.61–1.24)
GFR, Estimated: 52 mL/min — ABNORMAL LOW (ref >60)
GFR, Estimated: 50 mL/min — ABNORMAL LOW (ref >60)
Glucose, Bld: 136 mg/dL — ABNORMAL HIGH (ref 70–99)
Glucose, Bld: 102 mg/dL — ABNORMAL HIGH (ref 70–99)
Potassium: 3.8 mmol/L (ref 3.5–5.1)
Potassium: 4.2 mmol/L (ref 3.5–5.1)
Sodium: 137 mmol/L (ref 135–145)
Sodium: 140 mmol/L (ref 135–145)

## 2023-03-03 LAB — CBC
HCT: 40.1 % (ref 39.0–52.0)
HCT: 41.5 % (ref 39.0–52.0)
Hemoglobin: 13.5 g/dL (ref 13.0–17.0)
Hemoglobin: 14.1 g/dL (ref 13.0–17.0)
MCH: 28 pg (ref 26.0–34.0)
MCH: 28.2 pg (ref 26.0–34.0)
MCHC: 33.7 g/dL (ref 30.0–36.0)
MCHC: 34 g/dL (ref 30.0–36.0)
MCV: 83 fL (ref 80.0–100.0)
MCV: 83 fL (ref 80.0–100.0)
Platelets: 173 10*3/uL (ref 150–400)
Platelets: 197 10*3/uL (ref 150–400)
RBC: 4.83 MIL/uL (ref 4.22–5.81)
RBC: 5 MIL/uL (ref 4.22–5.81)
RDW: 13.4 % (ref 11.5–15.5)
RDW: 13.5 % (ref 11.5–15.5)
WBC: 6.1 10*3/uL (ref 4.0–10.5)
WBC: 6.5 10*3/uL (ref 4.0–10.5)
nRBC: 0 % (ref 0.0–0.2)
nRBC: 0 % (ref 0.0–0.2)

## 2023-03-03 LAB — TROPONIN I (HIGH SENSITIVITY)
Troponin I (High Sensitivity): 65 ng/L — ABNORMAL HIGH (ref <18)
Troponin I (High Sensitivity): 61 ng/L — ABNORMAL HIGH (ref <18)

## 2023-03-03 MED ORDER — ASPIRIN 81 MG PO TBEC
81.0000 mg | DELAYED_RELEASE_TABLET | Freq: Every day | ORAL | Status: DC
  Administered 2023-03-04: 81 mg via ORAL
  Filled 2023-03-03: qty 1

## 2023-03-03 MED ORDER — CARVEDILOL 3.125 MG PO TABS
3.1250 mg | ORAL_TABLET | Freq: Two times a day (BID) | ORAL | Status: DC
  Administered 2023-03-04: 3.125 mg via ORAL
  Filled 2023-03-03 (×2): qty 1

## 2023-03-03 MED ORDER — NITROGLYCERIN 0.4 MG SL SUBL: 0.4000 mg | SUBLINGUAL_TABLET | SUBLINGUAL | Status: DC | PRN

## 2023-03-03 MED ORDER — ONDANSETRON HCL 4 MG/2ML IJ SOLN: 4.0000 mg | Freq: Four times a day (QID) | INTRAMUSCULAR | Status: DC | PRN

## 2023-03-03 MED ORDER — ATORVASTATIN CALCIUM 20 MG PO TABS
80.0000 mg | ORAL_TABLET | Freq: Every evening | ORAL | Status: DC
  Administered 2023-03-03: 80 mg via ORAL
  Filled 2023-03-03: qty 4

## 2023-03-03 MED ORDER — SACUBITRIL-VALSARTAN 24-26 MG PO TABS
1.0000 | ORAL_TABLET | Freq: Two times a day (BID) | ORAL | Status: DC
  Administered 2023-03-03 – 2023-03-04 (×2): 1 via ORAL
  Filled 2023-03-03 (×2): qty 1

## 2023-03-03 MED ORDER — HEPARIN SODIUM (PORCINE) 5000 UNIT/ML IJ SOLN
5000.0000 [IU] | Freq: Three times a day (TID) | INTRAMUSCULAR | Status: DC
  Administered 2023-03-03 – 2023-03-04 (×2): 5000 [IU] via SUBCUTANEOUS
  Filled 2023-03-03 (×2): qty 1

## 2023-03-03 MED ORDER — CARVEDILOL 3.125 MG PO TABS
3.1250 mg | ORAL_TABLET | Freq: Two times a day (BID) | ORAL | Status: DC
  Administered 2023-03-03: 3.125 mg via ORAL
  Filled 2023-03-03: qty 1

## 2023-03-03 MED ORDER — RANOLAZINE ER 500 MG PO TB12
500.0000 mg | ORAL_TABLET | Freq: Two times a day (BID) | ORAL | Status: DC
  Administered 2023-03-03 – 2023-03-04 (×2): 500 mg via ORAL
  Filled 2023-03-03 (×2): qty 1

## 2023-03-03 MED ORDER — EZETIMIBE 10 MG PO TABS
10.0000 mg | ORAL_TABLET | Freq: Every day | ORAL | Status: DC
  Administered 2023-03-04: 10 mg via ORAL
  Filled 2023-03-03: qty 1

## 2023-03-03 MED ORDER — CLOPIDOGREL BISULFATE 75 MG PO TABS
75.0000 mg | ORAL_TABLET | Freq: Every day | ORAL | Status: DC
  Administered 2023-03-04: 75 mg via ORAL
  Filled 2023-03-03: qty 1

## 2023-03-03 MED ORDER — NITROGLYCERIN 0.4 MG SL SUBL
0.4000 mg | SUBLINGUAL_TABLET | Freq: Once | SUBLINGUAL | Status: AC
  Administered 2023-03-03: 0.4 mg via SUBLINGUAL
  Filled 2023-03-03: qty 1

## 2023-03-03 MED ORDER — SPIRONOLACTONE 25 MG PO TABS
25.0000 mg | ORAL_TABLET | Freq: Every day | ORAL | Status: DC
  Administered 2023-03-04: 25 mg via ORAL
  Filled 2023-03-03: qty 1

## 2023-03-03 MED ORDER — CARVEDILOL 3.125 MG PO TABS: 3.1250 mg | ORAL_TABLET | Freq: Two times a day (BID) | ORAL | 3 refills | Status: DC

## 2023-03-03 MED ORDER — GABAPENTIN 100 MG PO CAPS: 100.0000 mg | ORAL_CAPSULE | Freq: Three times a day (TID) | ORAL | Status: DC | PRN

## 2023-03-03 MED ORDER — FAMOTIDINE 20 MG PO TABS: 10.0000 mg | ORAL_TABLET | Freq: Every evening | ORAL | Status: DC | PRN

## 2023-03-03 MED ORDER — ACETAMINOPHEN 325 MG PO TABS: 650.0000 mg | ORAL_TABLET | ORAL | Status: DC | PRN

## 2023-03-03 NOTE — ED Triage Notes (Signed)
Pt arrives via [facility] for midsternal non-radiating CP that started while visiting family at [employer]. Pt states he had outpatient cardiac cath yesterday and was admitted overnight for obs. Pt given 324 mg ASA pta.

## 2023-03-03 NOTE — Discharge Summary (Addendum)
Discharge Summary    Patient ID: Clarence [name] MRN: [id number]; DOB: 01/16/1971  Admit date: 03/02/2023 Discharge date: 03/03/2023  PCP:  Caesar Bookman, NP   [facility] Providers Cardiologist:  Yvonne Kendall, MD  Advanced Heart Failure:  Marca Ancona, MD  {  Discharge Diagnoses    Principal Problem:   Coronary artery disease involving native coronary artery of native heart with unstable angina pectoris [facility]) Active Problems:   Essential hypertension   Chronic HFrEF (heart failure with reduced ejection fraction) (HCC)   Mixed hyperlipidemia   Diagnostic Studies/Procedures    LHC 03/02/23 Conclusions: Severe two-vessel coronary artery disease with 99% proximal/mid OM1 stenosis and chronic total occlusion of the mid RCA with left-to-right collaterals.  Moderate mid LAD and LCx disease is similar to prior catheterizations. Upper normal left ventricular filling pressure (LVEDP 15 mmHg). Successful PCI to OM1 using Onyx Frontier 2.25 x 34 mm drug-eluting stent with 0% residual stenosis and TIMI-3 flow.   Recommendations: Overnight observation with gentle postcatheterization hydration in the setting of chronic kidney disease and heart failure with mildly reduced ejection fraction. Dual antiplatelet therapy with aspirin and clopidogrel for at least 6 months. Aggressive secondary prevention of coronary artery disease. Continue current antianginal and goal-directed heart failure therapies.   Yvonne Kendall, MD [facility]  Coronary Diagrams  Diagnostic Dominance: Right  Intervention      Echo 08/2022  1. Left ventricular ejection fraction, by estimation, is 45 to 50%. The  left ventricle has mildly decreased function. The left ventricle  demonstrates regional wall motion abnormalities (hypokinesis of the  inferior and inferolateral wall). There is  moderate left ventricular hypertrophy. Left ventricular diastolic  parameters are consistent with  Grade I diastolic dysfunction (impaired  relaxation).   2. Right ventricular systolic function is normal. The right ventricular  size is normal. Tricuspid regurgitation signal is inadequate for assessing  PA pressure.   3. The mitral valve is normal in structure. No evidence of mitral valve  regurgitation. No evidence of mitral stenosis.   4. The aortic valve is tricuspid. Aortic valve regurgitation is not  visualized. No aortic stenosis is present.   5. The inferior vena cava is normal in size with greater than 50%  respiratory variability, suggesting right atrial pressure of 3 mmHg.  _____________   History of Present Illness     Clarence [name] is a 52 y.o. male with CAD, mixed ischemia and nonischemic CM, chronic HFrEF, (LVEF 45-50% 08/2022), pulmonary arterial hypertension, CKD stage 2, HTN, HLD, Graves disease s/p colectomy with ostomy who os being seen chest discomfort.   Patient had a heart cath in 09/2022 with CTO of mRCA, secondary to lesion 40% stenosed, first marginal lesion 90% stenosis, mid LAD lesion 50% stenosis, proximal circumflex to mid circumflex lesion 60% stenosis. Prior studies showed CTO of RCA with collaterals, severe diffuse disease of OM1 with nonobstructive disease in the LAD and left circumflex.   The patient was seen in the ER 02/23/23 with chest discomfort. HS trop 26>29. Scr 1.79. the patient was given ASA and SL NTG with improvement of symptoms and sent home with cardiology follow-up.  He was seen in the office 10/10 reporting persistent chest pain, worse with exertion. EKG showed ST and T wave changes. He was scheduled for a heart cath.   Hospital Course     Consultants: None   The patient arrived for procedure 03/02/23. Cardiac cath showed severe 2C CAD with 99% proximal/mid OM1 stenosis and  chronic total occlusion of the mid RCA with L>R collaterals, moderate mid LAD and LC disease similar to prior catheterizations. Upper normal left ventricular filling  pressure. The patient was treated with PCI to OM1. The patient was kept overnight for observation. Plan to continue DAPT with ASA and Plavix for at least 6 months. We will continue all other medications at discharge. The patient ambulated without anginal symptoms. Cath site remained stable.   The patient was evaluated by Dr. Mariah Milling on 03/03/23 and was found to be stable for discharge.   GEN: Well nourished, well developed in no acute distress NECK: No JVD; No carotid bruits CARDIAC: RRR, no murmurs, rubs, gallops RESPIRATORY:  Clear to auscultation without rales, wheezing or rhonchi  ABDOMEN: Soft, non-tender, non-distended EXTREMITIES:  No edema; No deformity   Did the patient have an acute coronary syndrome (MI, NSTEMI, STEMI, etc) this admission?:  No                               Did the patient have a percutaneous coronary intervention (stent / angioplasty)?:  Yes.     Cath/PCI Registry Performance & Quality Measures: Aspirin prescribed? - Yes ADP Receptor Inhibitor (Plavix/Clopidogrel, Brilinta/Ticagrelor or Effient/Prasugrel) prescribed (includes medically managed patients)? - Yes High Intensity Statin (Lipitor 40-80mg  or Crestor 20-40mg ) prescribed? - Yes For EF <40%, was ACEI/ARB prescribed? - Yes For EF <40%, Aldosterone Antagonist (Spironolactone or Eplerenone) prescribed? - Yes Cardiac Rehab Phase II ordered? - Yes       _____________  Discharge Vitals Blood pressure 124/83, pulse (!) 59, temperature 97.8 F (36.6 C), temperature source Oral, resp. rate 16, height 5\' 10"  (1.778 m), weight 107 kg, SpO2 99%.  Filed Weights   03/02/23 1236  Weight: 107 kg    Labs & Radiologic Studies    CBC Recent Labs    03/03/23 0456  WBC 6.1  HGB 13.5  HCT 40.1  MCV 83.0  PLT 173   Basic Metabolic Panel Recent Labs    16/10/96 0456  NA 137  K 3.8  CL 107  CO2 23  GLUCOSE 136*  BUN 27*  CREATININE 1.56*  CALCIUM 8.8*   Liver Function Tests No results for  input(s): "AST", "ALT", "ALKPHOS", "BILITOT", "PROT", "ALBUMIN" in the last 72 hours. No results for input(s): "LIPASE", "AMYLASE" in the last 72 hours. High Sensitivity Troponin:   Recent Labs  Lab 02/23/23 1803 02/23/23 1932  TROPONINIHS 26* 29*    BNP Invalid input(s): "POCBNP" D-Dimer No results for input(s): "DDIMER" in the last 72 hours. Hemoglobin A1C No results for input(s): "HGBA1C" in the last 72 hours. Fasting Lipid Panel No results for input(s): "CHOL", "HDL", "LDLCALC", "TRIG", "CHOLHDL", "LDLDIRECT" in the last 72 hours. Thyroid Function Tests No results for input(s): "TSH", "T4TOTAL", "T3FREE", "THYROIDAB" in the last 72 hours.  Invalid input(s): "FREET3" _____________  CARDIAC CATHETERIZATION  Result Date: 03/02/2023 Conclusions: Severe two-vessel coronary artery disease with 99% proximal/mid OM1 stenosis and chronic total occlusion of the mid RCA with left-to-right collaterals.  Moderate mid LAD and LCx disease is similar to prior catheterizations. Upper normal left ventricular filling pressure (LVEDP 15 mmHg). Successful PCI to OM1 using Onyx Frontier 2.25 x 34 mm drug-eluting stent with 0% residual stenosis and TIMI-3 flow. Recommendations: Overnight observation with gentle postcatheterization hydration in the setting of chronic kidney disease and heart failure with mildly reduced ejection fraction. Dual antiplatelet therapy with aspirin and clopidogrel for at least 6  months. Aggressive secondary prevention of coronary artery disease. Continue current antianginal and goal-directed heart failure therapies. Yvonne Kendall, MD [facility]  DG Chest 1 View  Result Date: 02/23/2023 CLINICAL DATA:  Chest pain. EXAM: CHEST  1 VIEW COMPARISON:  11/06/2022 FINDINGS: The heart size and mediastinal contours are within normal limits. There is no evidence of pulmonary edema, consolidation, pneumothorax or pleural fluid. The visualized skeletal structures are unremarkable.  IMPRESSION: No active disease. Electronically Signed   By: Irish Lack M.D.   On: 02/23/2023 18:23   Disposition   Pt is being discharged home today in good condition.  Follow-up Plans & Appointments     Follow-up Information     Charlsie Quest, NP Follow up on 03/23/2023.   Specialty: Cardiology Contact information: [address] [place] [address] [phone]                Discharge Instructions     AMB Referral to Cardiac Rehabilitation - Phase II   Complete by: As directed    Diagnosis: Coronary Stents   After initial evaluation and assessments completed: Virtual Based Care may be provided alone or in conjunction with Phase 2 Cardiac Rehab based on patient barriers.: Yes   Intensive Cardiac Rehabilitation (ICR) [facility] location only OR Traditional Cardiac Rehabilitation (TCR) *If criteria for ICR are not met will enroll in TCR [facility] only): Yes   Diet - low sodium heart healthy   Complete by: As directed    Increase activity slowly   Complete by: As directed         Discharge Medications   Allergies as of 03/03/2023       Reactions   Ibuprofen Other (See Comments)   History of GIB with ibuprofen    Sulfacetamide Other (See Comments)   Neomycin-bacitracin Zn-polymyx    Other reaction(s): Other (See Comments)   Spironolactone    Hyperkalemia        Medication List     TAKE these medications    acetaminophen 325 MG tablet Commonly known as: TYLENOL Take 650 mg by mouth at bedtime.   aspirin EC 81 MG tablet Take 1 tablet (81 mg total) by mouth daily. Swallow whole.   atorvastatin 80 MG tablet Commonly known as: LIPITOR Take 1 tablet (80 mg total) by mouth every evening.   carvedilol 3.125 MG tablet Commonly known as: COREG Take 1 tablet (3.125 mg total) by mouth 2 (two) times daily with a meal. What changed:  medication strength how much to take   clopidogrel 75 MG tablet Commonly known as: PLAVIX Take 1 tablet (75  mg total) by mouth daily.   empagliflozin 10 MG Tabs tablet Commonly known as: Jardiance Take 1 tablet (10 mg total) by mouth daily.   Entresto 24-26 MG Generic drug: sacubitril-valsartan Take 1 tablet by mouth 2 (two) times daily.   ezetimibe 10 MG tablet Commonly known as: ZETIA Take 1 tablet (10 mg total) by mouth daily.   famotidine 10 MG tablet Commonly known as: PEPCID Take 10 mg by mouth at bedtime. As needed   fenofibrate 48 MG tablet Commonly known as: Tricor Take 1 tablet (48 mg total) by mouth daily.   furosemide 20 MG tablet Commonly known as: LASIX Take 1 tablet (20 mg total) by mouth daily as needed. For weight gain of 3 lb in 24 hours or 5 lbs in one week   gabapentin 100 MG capsule Commonly known as: Neurontin Take 1 capsule (100 mg total)  by mouth 3 (three) times daily as needed.   isosorbide mononitrate 60 MG 24 hr tablet Commonly known as: IMDUR Take 1.5 tablets (90 mg total) by mouth daily.   nitroGLYCERIN 0.4 MG SL tablet Commonly known as: Nitrostat Place 1 tablet (0.4 mg total) under the tongue every 5 (five) minutes as needed for chest pain.   ondansetron 4 MG tablet Commonly known as: ZOFRAN Take 1 tablet (4 mg total) by mouth every 8 (eight) hours as needed for nausea or vomiting.   pantoprazole 40 MG tablet Commonly known as: PROTONIX Take 1 tablet (40 mg total) by mouth daily.   ranolazine 500 MG 12 hr tablet Commonly known as: RANEXA TAKE 1 TABLET BY MOUTH TWICE A DAY What changed:  how much to take when to take this   spironolactone 25 MG tablet Commonly known as: ALDACTONE Take 1 tablet (25 mg total) by mouth daily.           Outstanding Labs/Studies   None  Duration of Discharge Encounter   Greater than 30 minutes including physician time.  Signed, Gay Moncivais David Stall, PA-C 03/03/2023, 9:32 AM

## 2023-03-03 NOTE — H&P (Signed)
History and Physical   GORJE ALBU JJO:[id number] DOB: 1970-07-15 DOA: 03/03/2023  PCP: Caesar Bookman, NP  Outpatient Specialists: Dr. Shirlee Latch, [facility] cardiology Patient coming from: Home  I have personally briefly reviewed patient's old medical records in [facility] EMR.  Chief Concern: Chest pain  HPI: Mr. Marvis Millison is a 52 year old male with history of coronary artery disease, with unstable angina, hypertension, non-insulin-dependent diabetes mellitus, GERD, hyperlipidemia, dual antiplatelet therapy, who presents emergency department for chief concerns of midsternal nonradiating chest pain.  Vitals in the ED showed temperature of 98.2, respiration rate of 18, heart rate 74, blood pressure 140/92, SpO2 100% on room air.  Serum sodium is 140, potassium 4.2, chloride 104, bicarb 25, BUN of 23, serum creatinine 1.63, EGFR 50, nonfasting blood glucose 102, WBC 6.5, hemoglobin 14.1, platelets of 197.  Per ED documentation, EMS gave patient aspirin 324 mg p.o. one-time dose.  ED treatment: Nitroglycerin 0.4 mg sublingual.  EDP discussed patient case with [facility] cardiology, and they recommend admitting patient for observation --------------------------- At bedside, he is able to tell me his name, age, location current calendar year.  Reports he was visiting his mother at [employer] when he developed chest pressure with squeezing sensation.  He states it feels similar to prior episodes of chest pain where he had to have stent placement.  Denies trauma to his person.  Denies associated shortness of breath, nausea, vomiting, diaphoresis.  Currently at bedside he is pain-free.  He had just been discharged from the hospital this morning and had received his morning medications including aspirin and Plavix prior to hospital discharge.  Social history: He lives at home on his own. He is a former tobacco user, quitting about 1 year ago. He denies etoh and recreational drug use. He is  currently disabled and formerly worked in burial/funeral industry/business.   ROS: Constitutional: no weight change, no fever ENT/Mouth: no sore throat, no rhinorrhea Eyes: no eye pain, no vision changes Cardiovascular: + chest pain, no dyspnea,  no edema, no palpitations Respiratory: no cough, no sputum, no wheezing Gastrointestinal: no nausea, no vomiting, no diarrhea, no constipation Genitourinary: no urinary incontinence, no dysuria, no hematuria Musculoskeletal: no arthralgias, no myalgias Skin: no skin lesions, no pruritus, Neuro: no weakness, no loss of consciousness, no syncope Psych: no anxiety, no depression, no decrease appetite Heme/Lymph: no bruising, no bleeding  ED Course: Discussed with emergency medicine provider, patient requiring hospitalization for chief concerns of chest pain.  Assessment/Plan  Principal Problem:   Chest pain Active Problems:   Essential hypertension   Ischemic cardiomyopathy   Crohn's disease (HCC)   Dyslipidemia   Pulmonary hypertension (HCC)   Mixed hyperlipidemia   Impingement syndrome of left shoulder region   CHF (congestive heart failure) (HCC)   CKD stage 3a, GFR 45-59 ml/min (HCC)   Elevated troponin   CAD S/P percutaneous coronary angioplasty   Assessment and Plan:  * Chest pain Continues to trend high sensitive troponin Home to antiplatelet therapy has been resumed, patient endorses that he has not missed any dosing since discharge today  Essential hypertension Home Coreg 3.125 mg p.o. twice daily with meals, Entresto 24-26 mg p.o. twice daily, spironolactone 25 mg daily resumed  CAD S/P percutaneous coronary angioplasty Patient is status post PCI with DES to OM1 03/02/2023 Patient was discharged approximately noon on day of admission today Home DAPT, high intensity statins, and heart failure medications have been resumed on admission Cardiology has been consulted  Elevated troponin Baseline high  sensitive troponin  has been 23-29 Per EDP, cardiology is aware We will continue to trend high sensitive troponin  CKD stage 3a, GFR 45-59 ml/min (HCC) At baseline  Mixed hyperlipidemia Atorvastatin 80 mg every evening resumed  Chart reviewed.   DVT prophylaxis: Heparin 5000 units subcutaneous every 8 hours Code Status: Full code Diet: Heart healthy Family Communication: No Disposition Plan: Clinical course Consults called: [facility] cardiology has been consulted by EDP who recommended hospitalization for observation Admission status: Telemetry cardiac, inpatient  Past Medical History:  Diagnosis Date   Ascending Aortic Dilation    a. 09/2021 Echo: Asc Ao 40mm.   CAD (coronary artery disease)    a. 07/2021 Cath: LM nl, LAD 14m, D2 40, LCX 65p/m, OM1 90, RCA 169m w/ L-.R collats to RPDA from septal 1/2-->Med rx.   Chronic HFrEF (heart failure with reduced ejection fraction) (HCC)    a. 07/2021 Echo: EF 30-35%, glob HK, mod LVH, GrII DD, nl RV fxn, mildly dil LA, mild-mod MR; b. 09/2021 Echo: EF 35-40%, glob HK, mod LVH, GrI DD, nl RV fxn, mildly dil RA, mild MR, Asc Ao 40mm.   CKD (chronic kidney disease) stage 2, GFR 60-89 ml/min    Crohn's disease (HCC)    Hypertension    Mixed Ischemic & Nonischemic cardiomyopathy    a. 07/2021 Echo: EF 30-35%; b. 07/2021 Cath: Occluded RCA w/ mod LAD/LCX dzs, and severe OM1 dzs-->Med Rx; c. 09/2021 Echo: EF 35-40%.   PAH (pulmonary artery hypertension) (HCC)    a. 07/2021 RHC: PA 67/30 (42).   Proteinuria    Past Surgical History:  Procedure Laterality Date   COLECTOMY     CORONARY STENT INTERVENTION N/A 03/02/2023   Procedure: CORONARY STENT INTERVENTION;  Surgeon: Yvonne Kendall, MD;  Location: [facility] INVASIVE CV LAB;  Service: Cardiovascular;  Laterality: N/A;   LEFT HEART CATH AND CORONARY ANGIOGRAPHY Left 03/02/2023   Procedure: LEFT HEART CATH AND CORONARY ANGIOGRAPHY;  Surgeon: Yvonne Kendall, MD;  Location: [facility] INVASIVE CV LAB;  Service: Cardiovascular;   Laterality: Left;   RIGHT/LEFT HEART CATH AND CORONARY ANGIOGRAPHY N/A 08/07/2021   Procedure: RIGHT/LEFT HEART CATH AND CORONARY ANGIOGRAPHY;  Surgeon: Yvonne Kendall, MD;  Location: [facility] INVASIVE CV LAB;  Service: Cardiovascular;  Laterality: N/A;   RIGHT/LEFT HEART CATH AND CORONARY ANGIOGRAPHY N/A 09/29/2022   Procedure: RIGHT/LEFT HEART CATH AND CORONARY ANGIOGRAPHY;  Surgeon: Laurey Morale, MD;  Location: [facility] INVASIVE CV LAB;  Service: Cardiovascular;  Laterality: N/A;   Social History:  reports that he quit smoking about 18 months ago. His smoking use included cigars. He does not have any smokeless tobacco history on file. He reports that he does not currently use alcohol after a past usage of about 1.0 standard drink of alcohol per week. He reports that he does not use drugs.  Allergies  Allergen Reactions   Ibuprofen Other (See Comments)    History of GIB with ibuprofen    Sulfacetamide Other (See Comments)   Neomycin-Bacitracin Zn-Polymyx     Other reaction(s): Other (See Comments)   Spironolactone     Hyperkalemia   Family History  Problem Relation Age of Onset   Hypertension Mother    Cancer Maternal Aunt    Heart disease Maternal Uncle    Pancreatic cancer Maternal Uncle    Family history: Family history reviewed and not pertinent.  Prior to Admission medications   Medication Sig Start Date End Date Taking? Authorizing Provider  acetaminophen (TYLENOL) 325 MG tablet Take 650 mg  by mouth at bedtime.    [provider]  aspirin EC 81 MG tablet Take 1 tablet (81 mg total) by mouth daily. Swallow whole. 05/20/22   Milford, Anderson Malta, [name]  atorvastatin (LIPITOR) 80 MG tablet Take 1 tablet (80 mg total) by mouth every evening. 10/29/22   Milford, Anderson Malta, [name]  carvedilol (COREG) 3.125 MG tablet Take 1 tablet (3.125 mg total) by mouth 2 (two) times daily with a meal. 03/03/23   Furth, Cadence H, PA-C  clopidogrel (PLAVIX) 75 MG tablet Take 1 tablet (75 mg total) by  mouth daily. 11/11/22   Laurey Morale, MD  empagliflozin (JARDIANCE) 10 MG TABS tablet Take 1 tablet (10 mg total) by mouth daily. 03/01/23   Ngetich, Dinah C, NP  ezetimibe (ZETIA) 10 MG tablet Take 1 tablet (10 mg total) by mouth daily. 11/11/22   Laurey Morale, MD  famotidine (PEPCID) 10 MG tablet Take 10 mg by mouth at bedtime. As needed    [provider]  fenofibrate (TRICOR) 48 MG tablet Take 1 tablet (48 mg total) by mouth daily. 05/20/22   Milford, Anderson Malta, [name]  furosemide (LASIX) 20 MG tablet Take 1 tablet (20 mg total) by mouth daily as needed. For weight gain of 3 lb in 24 hours or 5 lbs in one week 10/13/22   Ngetich, Dinah C, NP  gabapentin (NEURONTIN) 100 MG capsule Take 1 capsule (100 mg total) by mouth 3 (three) times daily as needed. 12/28/22   Poggi, Herb Grays, PA-C  isosorbide mononitrate (IMDUR) 60 MG 24 hr tablet Take 1.5 tablets (90 mg total) by mouth daily. 02/26/23 05/27/23  Jacklynn Ganong, [name]  nitroGLYCERIN (NITROSTAT) 0.4 MG SL tablet Place 1 tablet (0.4 mg total) under the tongue every 5 (five) minutes as needed for chest pain. 07/29/22   Ngetich, Dinah C, NP  ondansetron (ZOFRAN) 4 MG tablet Take 1 tablet (4 mg total) by mouth every 8 (eight) hours as needed for nausea or vomiting. 03/01/23   Ngetich, Dinah C, NP  pantoprazole (PROTONIX) 40 MG tablet Take 1 tablet (40 mg total) by mouth daily. 10/14/22   Laurey Morale, MD  ranolazine (RANEXA) 500 MG 12 hr tablet TAKE 1 TABLET BY MOUTH TWICE A DAY Patient taking differently: Take 250 mg by mouth 4 (four) times daily. 01/15/23   Laurey Morale, MD  sacubitril-valsartan (ENTRESTO) 24-26 MG Take 1 tablet by mouth 2 (two) times daily. 10/13/22   Laurey Morale, MD  spironolactone (ALDACTONE) 25 MG tablet Take 1 tablet (25 mg total) by mouth daily. 01/04/23   Laurey Morale, MD   Physical Exam: Vitals:   03/03/23 1800 03/03/23 1815 03/03/23 1830 03/03/23 1900  BP: 132/88 125/79 (!) 134/93 (!) 154/95  Pulse:  61 62 60 74  Resp: 19 (!) 21 (!) 23 19  Temp:      TempSrc:      SpO2: 100% 100% 100% 98%  Weight:      Height:       Constitutional: appears appropriate, NAD, calm Eyes: PERRL, lids and conjunctivae normal ENMT: Mucous membranes are moist. Posterior pharynx clear of any exudate or lesions. Age-appropriate dentition. Hearing appropriate Neck: normal, supple, no masses, no thyromegaly Respiratory: clear to auscultation bilaterally, no wheezing, no crackles. Normal respiratory effort. No accessory muscle use.  Cardiovascular: Regular rate and rhythm, no murmurs / rubs / gallops. No extremity edema. 2+ pedal pulses. No carotid bruits.  Abdomen: no tenderness, no masses palpated, no  hepatosplenomegaly. Bowel sounds positive.  Musculoskeletal: no clubbing / cyanosis. No joint deformity upper and lower extremities. Good ROM, no contractures, no atrophy. Normal muscle tone.  Skin: no rashes, lesions, ulcers. No induration Neurologic: Sensation intact. Strength 5/5 in all 4.  Psychiatric: Normal judgment and insight. Alert and oriented x 3. Normal mood.   EKG: independently reviewed, showing sinus rhythm with rate of 71, QTc 431  Chest x-ray on Admission: I personally reviewed and do not perceive acute cardiopulmonary disease.  Pending official radiology read.  CARDIAC CATHETERIZATION  Result Date: 03/02/2023 Conclusions: Severe two-vessel coronary artery disease with 99% proximal/mid OM1 stenosis and chronic total occlusion of the mid RCA with left-to-right collaterals.  Moderate mid LAD and LCx disease is similar to prior catheterizations. Upper normal left ventricular filling pressure (LVEDP 15 mmHg). Successful PCI to OM1 using Onyx Frontier 2.25 x 34 mm drug-eluting stent with 0% residual stenosis and TIMI-3 flow. Recommendations: Overnight observation with gentle postcatheterization hydration in the setting of chronic kidney disease and heart failure with mildly reduced ejection fraction.  Dual antiplatelet therapy with aspirin and clopidogrel for at least 6 months. Aggressive secondary prevention of coronary artery disease. Continue current antianginal and goal-directed heart failure therapies. Yvonne Kendall, MD [facility]   Labs on Admission: I have personally reviewed following labs  CBC: Recent Labs  Lab 02/25/23 1107 03/03/23 0456 03/03/23 1634  WBC 6.5 6.1 6.5  HGB 14.8 13.5 14.1  HCT 44.6 40.1 41.5  MCV 86 83.0 83.0  PLT 232 173 197   Basic Metabolic Panel: Recent Labs  Lab 02/25/23 1107 03/03/23 0456 03/03/23 1634  NA 142 137 140  K 4.8 3.8 4.2  CL 103 107 104  CO2 22 23 25   GLUCOSE 122* 136* 102*  BUN 19 27* 23*  CREATININE 1.57* 1.56* 1.63*  CALCIUM 9.9 8.8* 9.6   GFR: Estimated Creatinine Clearance: 63.5 mL/min (A) (by C-G formula based on SCr of 1.63 mg/dL (H)).  CBG: Recent Labs  Lab 03/02/23 1249  GLUCAP 101*   Urine analysis:    Component Value Date/Time   COLORURINE YELLOW (A) 11/10/2022 1553   APPEARANCEUR CLEAR (A) 11/10/2022 1553   APPEARANCEUR Clear 10/24/2012 1000   LABSPEC 1.016 11/10/2022 1553   LABSPEC 1.015 10/24/2012 1000   PHURINE 5.0 11/10/2022 1553   GLUCOSEU >=500 (A) 11/10/2022 1553   GLUCOSEU Negative 10/24/2012 1000   HGBUR NEGATIVE 11/10/2022 1553   BILIRUBINUR NEGATIVE 11/10/2022 1553   BILIRUBINUR Negative 10/24/2012 1000   KETONESUR NEGATIVE 11/10/2022 1553   PROTEINUR NEGATIVE 11/10/2022 1553   NITRITE NEGATIVE 11/10/2022 1553   LEUKOCYTESUR TRACE (A) 11/10/2022 1553   LEUKOCYTESUR Negative 10/24/2012 1000   This document was prepared using Dragon Voice Recognition software and may include unintentional dictation errors.  Dr. Sedalia Muta [facility]  If 7PM-7AM, please contact overnight-coverage provider If 7AM-7PM, please contact day attending provider [email or web address]  03/03/2023, 7:36 PM

## 2023-03-03 NOTE — Assessment & Plan Note (Signed)
At baseline 

## 2023-03-03 NOTE — Progress Notes (Signed)
Patient admitted to the unit and placed on telemetry. Erythema noted on the right side of the upper and lower back. No other skin issues noted at this time. Skin assessment completed by myself and Shelbie Hutching.

## 2023-03-03 NOTE — Plan of Care (Addendum)
  Problem: Activity: Goal: Ability to return to baseline activity level will improve Outcome: Progressing   Problem: Pain Managment: Goal: General experience of comfort will improve Outcome: Progressing   Problem: Skin Integrity: Goal: Risk for impaired skin integrity will decrease Outcome: Progressing

## 2023-03-03 NOTE — ED Provider Notes (Addendum)
[facility] Provider Note    Event Date/Time   First MD Initiated Contact with Patient 03/03/23 1624     (approximate)   History   Chest Pain   HPI  Clarence [name] is a 52 y.o. male past medical history significant for CAD, cardiomyopathy with an EF of 45-50%, hyperlipidemia, who presents to the emergency department with chest pain.  Patient states that he started having chest pain approximately 30 minutes prior to arrival.  Complaining of a wave of pressure sensation to his entire left side of his chest which feels different than prior episodes of chest pain that he had before.  Was helping his mother at [employer] whenever this occurred.  Denies association with nausea, vomiting, diaphoresis or shortness of breath.  Patient had a cardiac catheterization done yesterday with Dr. Okey Dupre and had a stent placed to his OM.  Endorses compliance with all of his home medications since that time.  Patient received aspirin with EMS.  Currently complaining of mild chest pain and states that it feels better than when compared to prior.  No shortness of breath.  No history of DVT or PE.     Physical Exam   Triage Vital Signs: ED Triage Vitals  Encounter Vitals Group     BP 03/03/23 1632 (!) 140/92     Systolic BP Percentile --      Diastolic BP Percentile --      Pulse Rate 03/03/23 1632 74     Resp 03/03/23 1632 18     Temp 03/03/23 1632 98.2 F (36.8 C)     Temp Source 03/03/23 1632 Oral     SpO2 03/03/23 1632 100 %     Weight --      Height --      Head Circumference --      Peak Flow --      Pain Score 03/03/23 1633 2     Pain Loc --      Pain Education --      Exclude from Growth Chart --     Most recent vital signs: Vitals:   03/03/23 1815 03/03/23 1830  BP: 125/79 (!) 134/93  Pulse: 62 60  Resp: (!) 21 (!) 23  Temp:    SpO2: 100% 100%    Physical Exam Constitutional:      Appearance: He is well-developed.  HENT:     Head: Atraumatic.   Eyes:     Conjunctiva/sclera: Conjunctivae normal.  Cardiovascular:     Rate and Rhythm: Regular rhythm.     Heart sounds: Normal heart sounds. No murmur heard. Pulmonary:     Effort: No respiratory distress.  Chest:     Chest wall: No tenderness.  Abdominal:     Palpations: Abdomen is soft.  Musculoskeletal:     Cervical back: Normal range of motion.     Right lower leg: No edema.     Left lower leg: No edema.  Skin:    General: Skin is warm.     Capillary Refill: Capillary refill takes less than 2 seconds.  Neurological:     Mental Status: He is alert. Mental status is at baseline.     IMPRESSION / MDM / ASSESSMENT AND PLAN / ED COURSE  I reviewed the triage vital signs and the nursing notes.  Differential diagnosis including ACS, anemia, pericarditis, pneumonia, pulmonary embolism  EKG  I, Corena Herter, the attending physician, personally viewed and interpreted this ECG.   Rate:  Normal  Rhythm: Normal sinus  Axis: Normal  Intervals: Normal  ST&T Change: ST elevation to the inferior leads with depression to the lateral leads with inverted T wave.  No significant change when compared to prior EKG done earlier today.  No tachycardic or bradycardic dysrhythmias while on cardiac telemetry.  RADIOLOGY I independently reviewed imaging, my interpretation of imaging: Chest x-ray no signs of pneumonia or pulmonary edema.  Read currently pending.  LABS (all labs ordered are listed, but only abnormal results are displayed) Labs interpreted as -    Labs Reviewed  BASIC METABOLIC PANEL - Abnormal; Notable for the following components:      Result Value   Glucose, Bld 102 (*)    BUN 23 (*)    Creatinine, Ser 1.63 (*)    GFR, Estimated 50 (*)    All other components within normal limits  TROPONIN I (HIGH SENSITIVITY) - Abnormal; Notable for the following components:   Troponin I (High Sensitivity) 65 (*)    All other components within normal limits  CBC  TROPONIN I  (HIGH SENSITIVITY)     MDM Patient given aspirin and nitroglycerin.  EKG without acute findings, not consistent with acute pericarditis.  Initial troponin mildly elevated at 65.  Chest pain-free.  Consulted cardiology, discussed with Dr. Melton Alar, recommended admission to the hospitalist and trending troponin.  Did not recommend any further intervention at this time.      PROCEDURES:  Critical Care performed: yes  .Critical Care  Performed by: Corena Herter, MD Authorized by: Corena Herter, MD   Critical care provider statement:    Critical care time (minutes):  35   Critical care was necessary to treat or prevent imminent or life-threatening deterioration of the following conditions:  Cardiac failure   Critical care was time spent personally by me on the following activities:  Development of treatment plan with patient or surrogate, discussions with consultants, evaluation of patient's response to treatment, examination of patient, ordering and review of laboratory studies, ordering and review of radiographic studies, ordering and performing treatments and interventions, pulse oximetry, re-evaluation of patient's condition and review of old charts   I assumed direction of critical care for this patient from another provider in my specialty: no     Care discussed with: admitting provider     Patient's presentation is most consistent with acute presentation with potential threat to life or bodily function.   MEDICATIONS ORDERED IN ED: Medications  acetaminophen (TYLENOL) tablet 650 mg (has no administration in time range)  ondansetron (ZOFRAN) injection 4 mg (has no administration in time range)  heparin injection 5,000 Units (has no administration in time range)  aspirin EC tablet 81 mg (has no administration in time range)  carvedilol (COREG) tablet 3.125 mg (3.125 mg Oral Not Given 03/03/23 1854)  clopidogrel (PLAVIX) tablet 75 mg (has no administration in time range)   nitroGLYCERIN (NITROSTAT) SL tablet 0.4 mg (0.4 mg Sublingual Given 03/03/23 1640)    FINAL CLINICAL IMPRESSION(S) / ED DIAGNOSES   Final diagnoses:  Chest pain, unspecified type     Rx / DC Orders   ED Discharge Orders     None        Note:  This document was prepared using Dragon voice recognition software and may include unintentional dictation errors.   Corena Herter, MD 03/03/23 [place]    Corena Herter, MD 03/03/23 [phone]

## 2023-03-03 NOTE — Assessment & Plan Note (Signed)
Continues to trend high sensitive troponin Home to antiplatelet therapy has been resumed, patient endorses that he has not missed any dosing since discharge today

## 2023-03-03 NOTE — Hospital Course (Signed)
Clarence [name] is a 52 year old male with history of coronary artery disease, with unstable angina, hypertension, non-insulin-dependent diabetes mellitus, GERD, hyperlipidemia, dual antiplatelet therapy, who presents emergency department for chief concerns of midsternal nonradiating chest pain.  Vitals in the ED showed temperature of 98.2, respiration rate of 18, heart rate 74, blood pressure 140/92, SpO2 100% on room air.  Serum sodium is 140, potassium 4.2, chloride 104, bicarb 25, BUN of 23, serum creatinine 1.63, EGFR 50, nonfasting blood glucose 102, WBC 6.5, hemoglobin 14.1, platelets of 197.  Per ED documentation, EMS gave patient aspirin 324 mg p.o. one-time dose.  ED treatment: Nitroglycerin 0.4 mg sublingual.  EDP discussed patient case with [facility] cardiology, and they recommend admitting patient for observation

## 2023-03-03 NOTE — Assessment & Plan Note (Signed)
Patient is status post PCI with DES to OM1 03/02/2023 Patient was discharged approximately noon on day of admission today Home DAPT, high intensity statins, and heart failure medications have been resumed on admission Cardiology has been consulted

## 2023-03-03 NOTE — Assessment & Plan Note (Signed)
Home Coreg 3.125 mg p.o. twice daily with meals, Entresto 24-26 mg p.o. twice daily, spironolactone 25 mg daily resumed

## 2023-03-03 NOTE — Progress Notes (Signed)
Transition of Care [facility]) - Inpatient Brief Assessment   Patient Details  Name: Clarence [name] MRN: [id number] Date of Birth: 21-Jan-1971  Transition of Care [facility]) CM/SW Contact:    Darolyn Rua, LCSW Phone Number: 03/03/2023, 9:25 AM   Clinical Narrative:  Patient from home with CAD, unstable angina, L heart cath 10/10. Patient has PCP Richarda Blade, NP. Insurance is [employer]   No identified toc needs identified, please consult TOC should needs arise.   Transition of Care Asessment: Insurance and Status: Insurance coverage has been reviewed Patient has primary care physician: Yes Home environment has been reviewed: from home Prior level of function:: independent Prior/Current Home Services: No current home services Social Determinants of Health Reivew: SDOH reviewed no interventions necessary Readmission risk has been reviewed: Yes Transition of care needs: no transition of care needs at this time

## 2023-03-03 NOTE — Assessment & Plan Note (Signed)
Baseline high sensitive troponin has been 23-29 Per EDP, cardiology is aware We will continue to trend high sensitive troponin

## 2023-03-03 NOTE — Plan of Care (Signed)
  Problem: Activity: Goal: Ability to return to baseline activity level will improve Outcome: Progressing   Problem: Cardiovascular: Goal: Ability to achieve and maintain adequate cardiovascular perfusion will improve Outcome: Progressing   Problem: Activity: Goal: Risk for activity intolerance will decrease Outcome: Progressing   Problem: Pain Managment: Goal: General experience of comfort will improve Outcome: Progressing   Problem: Skin Integrity: Goal: Risk for impaired skin integrity will decrease Outcome: Progressing

## 2023-03-03 NOTE — Discharge Instructions (Signed)
No driving for 1 week. No lifting over 5 lbs for 5 days. No sexual activity for 2 weeks. You may return to work when cleared by cardiology . Keep procedure site clean & dry. If you notice increased pain, swelling, bleeding or pus, call/return!  You may shower, but no soaking baths/hot tubs/pools for 1 week.

## 2023-03-03 NOTE — Assessment & Plan Note (Signed)
Atorvastatin 80 mg every evening resumed

## 2023-03-04 ENCOUNTER — Other Ambulatory Visit (HOSPITAL_COMMUNITY): Payer: Self-pay

## 2023-03-04 ENCOUNTER — Telehealth: Payer: Self-pay

## 2023-03-04 DIAGNOSIS — Z9861 Coronary angioplasty status: Secondary | ICD-10-CM | POA: Diagnosis not present

## 2023-03-04 DIAGNOSIS — I10 Essential (primary) hypertension: Secondary | ICD-10-CM | POA: Diagnosis not present

## 2023-03-04 DIAGNOSIS — I5042 Chronic combined systolic (congestive) and diastolic (congestive) heart failure: Secondary | ICD-10-CM | POA: Diagnosis not present

## 2023-03-04 DIAGNOSIS — E782 Mixed hyperlipidemia: Secondary | ICD-10-CM

## 2023-03-04 DIAGNOSIS — I202 Refractory angina pectoris: Secondary | ICD-10-CM | POA: Diagnosis not present

## 2023-03-04 DIAGNOSIS — I25118 Atherosclerotic heart disease of native coronary artery with other forms of angina pectoris: Secondary | ICD-10-CM | POA: Diagnosis not present

## 2023-03-04 DIAGNOSIS — R079 Chest pain, unspecified: Secondary | ICD-10-CM | POA: Diagnosis not present

## 2023-03-04 MED ORDER — ISOSORBIDE MONONITRATE ER 60 MG PO TB24
90.0000 mg | ORAL_TABLET | Freq: Every day | ORAL | Status: DC
  Administered 2023-03-04: 90 mg via ORAL
  Filled 2023-03-04: qty 2

## 2023-03-04 NOTE — Discharge Summary (Signed)
Physician Discharge Summary  Clarence [name] ZOX:[id number] DOB: April 25, 1971 DOA: 03/03/2023  PCP: Caesar Bookman, NP  Admit date: 03/03/2023 Discharge date: 03/04/2023  Admitted From: Home Disposition:  Home  Recommendations for Outpatient Follow-up:  Follow up with PCP in 1-2 weeks Follow-up with cardiology as directed  Home Health: No Equipment/Devices: None  Discharge Condition: Stable CODE STATUS: Full Diet recommendation: Heart healthy  Brief/Interim Summary: 52 year old male with history of coronary artery disease, with unstable angina, hypertension, non-insulin-dependent diabetes mellitus, GERD, hyperlipidemia, dual antiplatelet therapy, who presents emergency department for chief concerns of midsternal nonradiating chest pain.   Chest pain resolved spontaneously.  No ischemic changes on EKG.  No significant change in delta.  Cardiology consult requested.  Case discussed with cardiology service.  No indication for ischemic evaluation or repeat catheterization.  No changes made at home medication regimen.  Patient stable for discharge home at this time.  Follow-up outpatient cardiology and PCP.    Discharge Diagnoses:  Principal Problem:   Chest pain Active Problems:   Essential hypertension   Ischemic cardiomyopathy   Crohn's disease (HCC)   Dyslipidemia   Pulmonary hypertension (HCC)   Mixed hyperlipidemia   Impingement syndrome of left shoulder region   CHF (congestive heart failure) (HCC)   CKD stage 3a, GFR 45-59 ml/min (HCC)   Elevated troponin   CAD S/P percutaneous coronary angioplasty  Atypical chest pain.  Unclear etiology.  Possible musculoskeletal.  Possibly related to uncontrolled hypertension.  Ensure medication adherence.  Follow-up outpatient cardiology and PCP.  Discharge Instructions  Discharge Instructions     Diet - low sodium heart healthy   Complete by: As directed    Increase activity slowly   Complete by: As directed        Allergies as of 03/04/2023       Reactions   Ibuprofen Other (See Comments)   History of GIB with ibuprofen    Sulfacetamide Other (See Comments)   Neomycin-bacitracin Zn-polymyx    Other reaction(s): Other (See Comments)   Spironolactone    Hyperkalemia        Medication List     TAKE these medications    acetaminophen 325 MG tablet Commonly known as: TYLENOL Take 650 mg by mouth at bedtime.   aspirin EC 81 MG tablet Take 1 tablet (81 mg total) by mouth daily. Swallow whole.   atorvastatin 80 MG tablet Commonly known as: LIPITOR Take 1 tablet (80 mg total) by mouth every evening.   carvedilol 3.125 MG tablet Commonly known as: COREG Take 1 tablet (3.125 mg total) by mouth 2 (two) times daily with a meal.   clopidogrel 75 MG tablet Commonly known as: PLAVIX Take 1 tablet (75 mg total) by mouth daily.   Entresto 24-26 MG Generic drug: sacubitril-valsartan Take 1 tablet by mouth 2 (two) times daily.   ezetimibe 10 MG tablet Commonly known as: ZETIA Take 1 tablet (10 mg total) by mouth daily.   famotidine 10 MG tablet Commonly known as: PEPCID Take 10 mg by mouth at bedtime. As needed   fenofibrate 48 MG tablet Commonly known as: Tricor Take 1 tablet (48 mg total) by mouth daily.   furosemide 20 MG tablet Commonly known as: LASIX Take 1 tablet (20 mg total) by mouth daily as needed. For weight gain of 3 lb in 24 hours or 5 lbs in one week   gabapentin 100 MG capsule Commonly known as: Neurontin Take 1 capsule (100 mg total) by mouth 3 (three) times  daily as needed.   isosorbide mononitrate 60 MG 24 hr tablet Commonly known as: IMDUR Take 1.5 tablets (90 mg total) by mouth daily.   Jardiance 10 MG Tabs tablet Generic drug: empagliflozin Take 1 tablet (10 mg total) by mouth daily.   nitroGLYCERIN 0.4 MG SL tablet Commonly known as: Nitrostat Place 1 tablet (0.4 mg total) under the tongue every 5 (five) minutes as needed for chest pain.    ondansetron 4 MG tablet Commonly known as: ZOFRAN Take 1 tablet (4 mg total) by mouth every 8 (eight) hours as needed for nausea or vomiting.   pantoprazole 40 MG tablet Commonly known as: PROTONIX Take 1 tablet (40 mg total) by mouth daily.   ranolazine 500 MG 12 hr tablet Commonly known as: RANEXA TAKE 1 TABLET BY MOUTH TWICE A DAY What changed:  how much to take when to take this   spironolactone 25 MG tablet Commonly known as: ALDACTONE Take 1 tablet (25 mg total) by mouth daily.        Allergies  Allergen Reactions   Ibuprofen Other (See Comments)    History of GIB with ibuprofen    Sulfacetamide Other (See Comments)   Neomycin-Bacitracin Zn-Polymyx     Other reaction(s): Other (See Comments)   Spironolactone     Hyperkalemia    Consultations: Cardiology-[facility]   Procedures/Studies: DG Chest 2 View  Result Date: 03/03/2023 CLINICAL DATA:  Chest pain EXAM: CHEST - 2 VIEW COMPARISON:  02/23/2023 FINDINGS: The heart size and mediastinal contours are within normal limits. Both lungs are clear. The visualized skeletal structures are unremarkable. IMPRESSION: No active cardiopulmonary disease. Electronically Signed   By: Minerva Fester M.D.   On: 03/03/2023 20:15   CARDIAC CATHETERIZATION  Result Date: 03/02/2023 Conclusions: Severe two-vessel coronary artery disease with 99% proximal/mid OM1 stenosis and chronic total occlusion of the mid RCA with left-to-right collaterals.  Moderate mid LAD and LCx disease is similar to prior catheterizations. Upper normal left ventricular filling pressure (LVEDP 15 mmHg). Successful PCI to OM1 using Onyx Frontier 2.25 x 34 mm drug-eluting stent with 0% residual stenosis and TIMI-3 flow. Recommendations: Overnight observation with gentle postcatheterization hydration in the setting of chronic kidney disease and heart failure with mildly reduced ejection fraction. Dual antiplatelet therapy with aspirin and clopidogrel for at least 6  months. Aggressive secondary prevention of coronary artery disease. Continue current antianginal and goal-directed heart failure therapies. Yvonne Kendall, MD [facility]  DG Chest 1 View  Result Date: 02/23/2023 CLINICAL DATA:  Chest pain. EXAM: CHEST  1 VIEW COMPARISON:  11/06/2022 FINDINGS: The heart size and mediastinal contours are within normal limits. There is no evidence of pulmonary edema, consolidation, pneumothorax or pleural fluid. The visualized skeletal structures are unremarkable. IMPRESSION: No active disease. Electronically Signed   By: Irish Lack M.D.   On: 02/23/2023 18:23      Subjective: Seen and examined on the day of discharge.  Stable no distress peer appropriate for discharge home.  Discharge Exam: Vitals:   03/04/23 0546 03/04/23 1021  BP:  (!) 178/94  Pulse:  60  Resp:  16  Temp: 97.8 F (36.6 C) 98 F (36.7 C)  SpO2:  100%   Vitals:   03/04/23 0430 03/04/23 0500 03/04/23 0546 03/04/23 1021  BP: 127/73 119/83  (!) 178/94  Pulse: (!) 57 (!) 54  60  Resp: 18 17  16   Temp:   97.8 F (36.6 C) 98 F (36.7 C)  TempSrc:    Oral  SpO2: 100% 99%  100%  Weight:      Height:        General: Pt is alert, awake, not in acute distress Cardiovascular: RRR, S1/S2 +, no rubs, no gallops Respiratory: CTA bilaterally, no wheezing, no rhonchi Abdominal: Soft, NT, ND, bowel sounds + Extremities: no edema, no cyanosis    The results of significant diagnostics from this hospitalization (including imaging, microbiology, ancillary and laboratory) are listed below for reference.     Microbiology: No results found for this or any previous visit (from the past 240 hour(s)).   Labs: BNP (last 3 results) Recent Labs    10/02/22 1446 01/04/23 1102 02/23/23 1803  BNP 78 62.9 64.4   Basic Metabolic Panel: Recent Labs  Lab 03/03/23 0456 03/03/23 1634  NA 137 140  K 3.8 4.2  CL 107 104  CO2 23 25  GLUCOSE 136* 102*  BUN 27* 23*  CREATININE 1.56*  1.63*  CALCIUM 8.8* 9.6   Liver Function Tests: No results for input(s): "AST", "ALT", "ALKPHOS", "BILITOT", "PROT", "ALBUMIN" in the last 168 hours. No results for input(s): "LIPASE", "AMYLASE" in the last 168 hours. No results for input(s): "AMMONIA" in the last 168 hours. CBC: Recent Labs  Lab 03/03/23 0456 03/03/23 1634  WBC 6.1 6.5  HGB 13.5 14.1  HCT 40.1 41.5  MCV 83.0 83.0  PLT 173 197   Cardiac Enzymes: No results for input(s): "CKTOTAL", "CKMB", "CKMBINDEX", "TROPONINI" in the last 168 hours. BNP: Invalid input(s): "POCBNP" CBG: Recent Labs  Lab 03/02/23 1249  GLUCAP 101*   D-Dimer No results for input(s): "DDIMER" in the last 72 hours. Hgb A1c No results for input(s): "HGBA1C" in the last 72 hours. Lipid Profile No results for input(s): "CHOL", "HDL", "LDLCALC", "TRIG", "CHOLHDL", "LDLDIRECT" in the last 72 hours. Thyroid function studies No results for input(s): "TSH", "T4TOTAL", "T3FREE", "THYROIDAB" in the last 72 hours.  Invalid input(s): "FREET3" Anemia work up No results for input(s): "VITAMINB12", "FOLATE", "FERRITIN", "TIBC", "IRON", "RETICCTPCT" in the last 72 hours. Urinalysis    Component Value Date/Time   COLORURINE YELLOW (A) 11/10/2022 1553   APPEARANCEUR CLEAR (A) 11/10/2022 1553   APPEARANCEUR Clear 10/24/2012 1000   LABSPEC 1.016 11/10/2022 1553   LABSPEC 1.015 10/24/2012 1000   PHURINE 5.0 11/10/2022 1553   GLUCOSEU >=500 (A) 11/10/2022 1553   GLUCOSEU Negative 10/24/2012 1000   HGBUR NEGATIVE 11/10/2022 1553   BILIRUBINUR NEGATIVE 11/10/2022 1553   BILIRUBINUR Negative 10/24/2012 1000   KETONESUR NEGATIVE 11/10/2022 1553   PROTEINUR NEGATIVE 11/10/2022 1553   NITRITE NEGATIVE 11/10/2022 1553   LEUKOCYTESUR TRACE (A) 11/10/2022 1553   LEUKOCYTESUR Negative 10/24/2012 1000   Sepsis Labs Recent Labs  Lab 03/03/23 0456 03/03/23 1634  WBC 6.1 6.5   Microbiology No results found for this or any previous visit (from the past  240 hour(s)).   Time coordinating discharge: Over 30 minutes  SIGNED:   Tresa Moore, MD  [facility] 03/04/2023, 4:11 PM Pager   If 7PM-7AM, please contact night-coverage

## 2023-03-04 NOTE — ED Notes (Signed)
Pt ambulatory to restroom

## 2023-03-04 NOTE — Consult Note (Signed)
Cardiology Consultation   Patient ID: ARYE ESPEJO MRN: [id number]; DOB: 07/30/1970  Admit date: 03/03/2023 Date of Consult: 03/04/2023  PCP:  Caesar Bookman, NP   [facility] Providers Cardiologist:  Yvonne Kendall, MD  Advanced Heart Failure:  Marca Ancona, MD       Patient Profile:   Clarence [name] is a 52 y.o. male with a hx of CAD with recent PCI/DES OMI (03/02/23), mixed ischemic and nonischemic cardiomyopathy, stage III chronic kidney disease, hypertension, who is being seen 03/04/2023 for the evaluation of chest pain at the request of Dr. Georgeann Oppenheim.  History of Present Illness:   Mr. Carradine initially admitted in March 2023 with progressive dyspnea over the preceding several months and was found to have a new cardiomyopathy with atypical pneumonia.  High-sensitivity troponin peaked at 89.  Echo showed EF of 30-35% with global hypokinesis and G2 DD.  He was diuresed and GDMT was initiated.  He subsequently underwent right and left heart catheterization, which revealed an occluded RCA, small severe OM1 disease, moderate LAD and circumflex disease and left-to-right collaterals of the RPDA.  He had elevated filling pressures with moderate to severe pulmonary hypertension and moderately reduced cardiac output index.  Clinical picture was consistent with mixed ischemic and nonischemic cardiomyopathy and he was medically managed.  He was readmitted in May 2023 with chest pain and hypertensive urgency.  Repeat echo showed an EF of 35-40%.  Renal artery duplex was concerning for possible right renal artery stenosis.  CT of the abdomen and pelvis showed no acute abnormality or significant renal artery stenosis.  He was admitted in June 2023 with hypertensive urgency and demand ischemia was medically managed.  He quit smoking 3/23 admission.  Rarely drinks EtOH denies any drug use.  No strong family history of CAD or cardiomyopathy.  Prior sleep study was completed that was  not suggestive of OSA.  He was admitted in 4/24 with chest pain with no evidence of ACS.    Echo on 4/24 was improving showing EF 45-50%, inferior and inferior lateral hypokinesis and normal RV.  He continued chest pain episodes and had R/LHC that was done 5/24 showing occluded DM RCA with collaterals, 90% stenosis with severe diffuse disease OM1.  50% mLAD, 60% proximal to mid left circumflex, normal filling pressures and preserved cardiac output.  He presented again to the emergency room 11/06/2019 with atypical chest pain, negative workup.  Was in the ER again 11/10/2022 with lightheadedness.  Blood pressure was stable in the ER but patient stated systolic blood pressure was low in the 80s at home.  Blood pressure medications were adjusted.   He was last seen in clinic 02/25/2023 after recent emergency department visit with continued complaints of chest discomfort.  He described his chest pain as squeezing in the left shoulder, and down his left arm.  He had several episodes of chest discomfort waking him from night and was noted to have elevated blood pressures in the 150/110.  He is under an increased amount of stress as his mother has been sick at [facility] for rehab.  With this continued concern for chest discomfort he was scheduled for left heart catheterization 03/02/2023 where he underwent successful PCI/DES to the OM1.  He was continued on his antianginal medications and dual antiplatelet therapy.  Was recommended for aggressive secondary prevention of coronary artery disease as he was subsequently discharged from the facility after overnight observation on 03/03/2023.  He presented back to the [facility] emergency  department on 03/03/2023 with complaint of chest pain.  Patient states that he started having chest discomfort 30 minutes prior to arrival.  He describes as a wave of a pressure sensation to his entire left side of his chest which feels different than prior episodes of chest pain that he has had in the  past.  He was helping his mother resources when this occurred.  Denied associated nausea, vomiting or shortness of breath patient received aspirin via EMS blood pressure was suboptimally controlled/92, pulse of 70 respirations 18, temperature 98.2.  Pertinent labs revealed blood glucose 22, BUN 23, serum creatinine 1.6450, high-sensitivity troponin 65.  EKG was unrevealing.  He was treated with sublingual nitroglycerin.  And kept for observation overnight.  Cardiology was consulted this a.m. for concerns of chest pain status post left heart catheterization.  Past Medical History:  Diagnosis Date   Ascending Aortic Dilation    a. 09/2021 Echo: Asc Ao 40mm.   CAD (coronary artery disease)    a. 07/2021 Cath: LM nl, LAD 20m, D2 40, LCX 65p/m, OM1 90, RCA 171m w/ L-.R collats to RPDA from septal 1/2-->Med rx.   Chronic HFrEF (heart failure with reduced ejection fraction) (HCC)    a. 07/2021 Echo: EF 30-35%, glob HK, mod LVH, GrII DD, nl RV fxn, mildly dil LA, mild-mod MR; b. 09/2021 Echo: EF 35-40%, glob HK, mod LVH, GrI DD, nl RV fxn, mildly dil RA, mild MR, Asc Ao 40mm.   CKD (chronic kidney disease) stage 2, GFR 60-89 ml/min    Crohn's disease (HCC)    Hypertension    Mixed Ischemic & Nonischemic cardiomyopathy    a. 07/2021 Echo: EF 30-35%; b. 07/2021 Cath: Occluded RCA w/ mod LAD/LCX dzs, and severe OM1 dzs-->Med Rx; c. 09/2021 Echo: EF 35-40%.   PAH (pulmonary artery hypertension) (HCC)    a. 07/2021 RHC: PA 67/30 (42).   Proteinuria     Past Surgical History:  Procedure Laterality Date   COLECTOMY     CORONARY STENT INTERVENTION N/A 03/02/2023   Procedure: CORONARY STENT INTERVENTION;  Surgeon: Yvonne Kendall, MD;  Location: [facility] INVASIVE CV LAB;  Service: Cardiovascular;  Laterality: N/A;   LEFT HEART CATH AND CORONARY ANGIOGRAPHY Left 03/02/2023   Procedure: LEFT HEART CATH AND CORONARY ANGIOGRAPHY;  Surgeon: Yvonne Kendall, MD;  Location: [facility] INVASIVE CV LAB;  Service: Cardiovascular;   Laterality: Left;   RIGHT/LEFT HEART CATH AND CORONARY ANGIOGRAPHY N/A 08/07/2021   Procedure: RIGHT/LEFT HEART CATH AND CORONARY ANGIOGRAPHY;  Surgeon: Yvonne Kendall, MD;  Location: [facility] INVASIVE CV LAB;  Service: Cardiovascular;  Laterality: N/A;   RIGHT/LEFT HEART CATH AND CORONARY ANGIOGRAPHY N/A 09/29/2022   Procedure: RIGHT/LEFT HEART CATH AND CORONARY ANGIOGRAPHY;  Surgeon: Laurey Morale, MD;  Location: [facility] INVASIVE CV LAB;  Service: Cardiovascular;  Laterality: N/A;     Home Medications:  Prior to Admission medications   Medication Sig Start Date End Date Taking? Authorizing Provider  acetaminophen (TYLENOL) 325 MG tablet Take 650 mg by mouth at bedtime.   Yes [provider]  aspirin EC 81 MG tablet Take 1 tablet (81 mg total) by mouth daily. Swallow whole. 05/20/22  Yes Milford, Anderson Malta, [name]  atorvastatin (LIPITOR) 80 MG tablet Take 1 tablet (80 mg total) by mouth every evening. 10/29/22  Yes Milford, Anderson Malta, [name]  carvedilol (COREG) 3.125 MG tablet Take 1 tablet (3.125 mg total) by mouth 2 (two) times daily with a meal. 03/03/23  Yes Furth, Cadence H, PA-C  clopidogrel (PLAVIX)  75 MG tablet Take 1 tablet (75 mg total) by mouth daily. 11/11/22  Yes Laurey Morale, MD  empagliflozin (JARDIANCE) 10 MG TABS tablet Take 1 tablet (10 mg total) by mouth daily. 03/01/23  Yes Ngetich, Dinah C, NP  ezetimibe (ZETIA) 10 MG tablet Take 1 tablet (10 mg total) by mouth daily. 11/11/22  Yes Laurey Morale, MD  famotidine (PEPCID) 10 MG tablet Take 10 mg by mouth at bedtime. As needed   Yes [provider]  fenofibrate (TRICOR) 48 MG tablet Take 1 tablet (48 mg total) by mouth daily. 05/20/22  Yes Milford, Anderson Malta, [name]  gabapentin (NEURONTIN) 100 MG capsule Take 1 capsule (100 mg total) by mouth 3 (three) times daily as needed. 12/28/22  Yes Poggi, Herb Grays, PA-C  isosorbide mononitrate (IMDUR) 60 MG 24 hr tablet Take 1.5 tablets (90 mg total) by mouth daily. 02/26/23 05/27/23 Yes  Milford, Anderson Malta, [name]  nitroGLYCERIN (NITROSTAT) 0.4 MG SL tablet Place 1 tablet (0.4 mg total) under the tongue every 5 (five) minutes as needed for chest pain. 07/29/22  Yes Ngetich, Dinah C, NP  ondansetron (ZOFRAN) 4 MG tablet Take 1 tablet (4 mg total) by mouth every 8 (eight) hours as needed for nausea or vomiting. 03/01/23  Yes Ngetich, Dinah C, NP  pantoprazole (PROTONIX) 40 MG tablet Take 1 tablet (40 mg total) by mouth daily. 10/14/22  Yes Laurey Morale, MD  ranolazine (RANEXA) 500 MG 12 hr tablet TAKE 1 TABLET BY MOUTH TWICE A DAY Patient taking differently: Take 250 mg by mouth 4 (four) times daily. 01/15/23  Yes Laurey Morale, MD  sacubitril-valsartan (ENTRESTO) 24-26 MG Take 1 tablet by mouth 2 (two) times daily. 10/13/22  Yes Laurey Morale, MD  spironolactone (ALDACTONE) 25 MG tablet Take 1 tablet (25 mg total) by mouth daily. 01/04/23  Yes Laurey Morale, MD  furosemide (LASIX) 20 MG tablet Take 1 tablet (20 mg total) by mouth daily as needed. For weight gain of 3 lb in 24 hours or 5 lbs in one week 10/13/22   Ngetich, Donalee Citrin, NP    Inpatient Medications: Scheduled Meds:  aspirin EC  81 mg Oral Daily   atorvastatin  80 mg Oral QPM   carvedilol  3.125 mg Oral BID WC   clopidogrel  75 mg Oral QPC breakfast   ezetimibe  10 mg Oral Daily   heparin  5,000 Units Subcutaneous Q8H   ranolazine  500 mg Oral BID   sacubitril-valsartan  1 tablet Oral BID   sodium chloride flush  3 mL Intravenous Q12H   spironolactone  25 mg Oral Daily   Continuous Infusions:  PRN Meds: acetaminophen, famotidine, gabapentin, nitroGLYCERIN, ondansetron (ZOFRAN) IV  Allergies:    Allergies  Allergen Reactions   Ibuprofen Other (See Comments)    History of GIB with ibuprofen    Sulfacetamide Other (See Comments)   Neomycin-Bacitracin Zn-Polymyx     Other reaction(s): Other (See Comments)   Spironolactone     Hyperkalemia    Social History:   Social History   Socioeconomic History    Marital status: Single    Spouse name: Not on file   Number of children: Not on file   Years of education: Not on file   Highest education level: Not on file  Occupational History   Not on file  Tobacco Use   Smoking status: Former    Types: Cigars    Quit date: 08/13/2021    Years  since quitting: 1.5   Smokeless tobacco: Not on file  Vaping Use   Vaping status: Never Used  Substance and Sexual Activity   Alcohol use: Not Currently    Alcohol/week: 1.0 standard drink of alcohol    Types: 1 Standard drinks or equivalent per week   Drug use: Never   Sexual activity: Not Currently  Other Topics Concern   Not on file  Social History Narrative   Patient is a past smoker for 10 years, drinks decaf coffee and tea.   Patient is single and lives at home with his mother and uncle and has no pets.   Patient does not exercise daily due to difficulty walking distances.   Patient has no issues with his functional status and is independent in caring for himself   Social Determinants of Health   Financial Resource Strain: High Risk (01/29/2022)   Overall Financial Resource Strain (CARDIA)    Difficulty of Paying Living Expenses: Hard  Food Insecurity: Food Insecurity Present (03/02/2023)   Hunger Vital Sign    Worried About Running Out of Food in the Last Year: Never true    Ran Out of Food in the Last Year: Sometimes true  Transportation Needs: No Transportation Needs (03/02/2023)   PRAPARE - Administrator, Civil Service (Medical): No    Lack of Transportation (Non-Medical): No  Physical Activity: Not on file  Stress: Not on file  Social Connections: Unknown (09/29/2021)   Received from [facility], [facility]   Social Network    Social Network: Not on file  Intimate Partner Violence: Not At Risk (03/02/2023)   Humiliation, Afraid, Rape, and Kick questionnaire    Fear of Current or Ex-Partner: No    Emotionally Abused: No    Physically Abused: No    Sexually  Abused: No    Family History:    Family History  Problem Relation Age of Onset   Hypertension Mother    Cancer Maternal Aunt    Heart disease Maternal Uncle    Pancreatic cancer Maternal Uncle      ROS:  Please see the history of present illness.  Review of Systems  Constitutional:  Positive for malaise/fatigue.  Respiratory:  Positive for shortness of breath.   Cardiovascular:  Positive for chest pain and leg swelling.  Neurological:  Positive for weakness.    All other ROS reviewed and negative.     Physical Exam/Data:   Vitals:   03/04/23 0400 03/04/23 0430 03/04/23 0500 03/04/23 0546  BP: 122/78 127/73 119/83   Pulse: (!) 57 (!) 57 (!) 54   Resp: 19 18 17    Temp:    97.8 F (36.6 C)  TempSrc:      SpO2: 100% 100% 99%   Weight:      Height:       No intake or output data in the 24 hours ending 03/04/23 0919    03/03/2023    5:56 PM 03/02/2023   12:36 PM 02/25/2023    9:59 AM  Last 3 Weights  Weight (lbs) 235 lb 14.3 oz 235 lb 14.4 oz 237 lb 6.4 oz  Weight (kg) 107 kg 107.004 kg 107.684 kg     Body mass index is 33.85 kg/m.  General:  Well nourished, well developed, in no acute distress HEENT: normal Neck: no JVD Vascular: No carotid bruits; Distal pulses 2+ bilaterally Cardiac:  normal S1, S2; RRR; no murmur  Lungs:  clear to auscultation bilaterally, no wheezing, rhonchi or  rales  Abd: soft, nontender, no hepatomegaly, ostomy intact Ext: Trace pretibial edema Musculoskeletal:  No deformities, BUE and BLE strength normal and equal Skin: warm and dry  Neuro:  CNs 2-12 intact, no focal abnormalities noted Psych:  Normal affect   EKG:  The EKG was personally reviewed and demonstrates: Sinus rhythm ST elevation in lead II, and III, ST depression with TWI noted in V5 and V6 Telemetry:  Telemetry was personally reviewed and demonstrates:  sinus brady to sinus with rates of 50-70  Relevant CV Studies: LHC 03/02/2023 Conclusions: Severe two-vessel  coronary artery disease with 99% proximal/mid OM1 stenosis and chronic total occlusion of the mid RCA with left-to-right collaterals.  Moderate mid LAD and LCx disease is similar to prior catheterizations. Upper normal left ventricular filling pressure (LVEDP 15 mmHg). Successful PCI to OM1 using Onyx Frontier 2.25 x 34 mm drug-eluting stent with 0% residual stenosis and TIMI-3 flow.   Recommendations: Overnight observation with gentle postcatheterization hydration in the setting of chronic kidney disease and heart failure with mildly reduced ejection fraction. Dual antiplatelet therapy with aspirin and clopidogrel for at least 6 months. Aggressive secondary prevention of coronary artery disease. Continue current antianginal and goal-directed heart failure therapies.  R/LHC 09/29/2022   Mid RCA lesion is 100% stenosed.   2nd Diag lesion is 40% stenosed.   1st Mrg lesion is 90% stenosed.   Mid LAD lesion is 50% stenosed.   Prox Cx to Mid Cx lesion is 60% stenosed.   1. Normal filling pressures.  2. Preserved cardiac output.  3. Coronaries unchanged from prior study.  Has CTO RCA with collaterals.  There is severe, diffuse disease of OM1 with nonobstructive disease in the LAD and LCx.  Continue medical management.    TTE 09/11/22 1. Left ventricular ejection fraction, by estimation, is 45 to 50%. The  left ventricle has mildly decreased function. The left ventricle  demonstrates regional wall motion abnormalities (hypokinesis of the  inferior and inferolateral wall). There is  moderate left ventricular hypertrophy. Left ventricular diastolic  parameters are consistent with Grade I diastolic dysfunction (impaired  relaxation).   2. Right ventricular systolic function is normal. The right ventricular  size is normal. Tricuspid regurgitation signal is inadequate for assessing  PA pressure.   3. The mitral valve is normal in structure. No evidence of mitral valve  regurgitation. No evidence  of mitral stenosis.   4. The aortic valve is tricuspid. Aortic valve regurgitation is not  visualized. No aortic stenosis is present.   5. The inferior vena cava is normal in size with greater than 50%  respiratory variability, suggesting right atrial pressure of 3 mmHg.   Laboratory Data:  High Sensitivity Troponin:   Recent Labs  Lab 02/23/23 1803 02/23/23 1932 03/03/23 1634 03/03/23 1820  TROPONINIHS 26* 29* 65* 61*     Chemistry Recent Labs  Lab 02/25/23 1107 03/03/23 0456 03/03/23 1634  NA 142 137 140  K 4.8 3.8 4.2  CL 103 107 104  CO2 22 23 25   GLUCOSE 122* 136* 102*  BUN 19 27* 23*  CREATININE 1.57* 1.56* 1.63*  CALCIUM 9.9 8.8* 9.6  GFRNONAA  --  52* 50*  ANIONGAP  --  7 11    No results for input(s): "PROT", "ALBUMIN", "AST", "ALT", "ALKPHOS", "BILITOT" in the last 168 hours. Lipids No results for input(s): "CHOL", "TRIG", "HDL", "LABVLDL", "LDLCALC", "CHOLHDL" in the last 168 hours.  Hematology Recent Labs  Lab 02/25/23 1107 03/03/23 0456 03/03/23 1634  WBC 6.5 6.1 6.5  RBC 5.17 4.83 5.00  HGB 14.8 13.5 14.1  HCT 44.6 40.1 41.5  MCV 86 83.0 83.0  MCH 28.6 28.0 28.2  MCHC 33.2 33.7 34.0  RDW 13.5 13.4 13.5  PLT 232 173 197   Thyroid No results for input(s): "TSH", "FREET4" in the last 168 hours.  BNPNo results for input(s): "BNP", "PROBNP" in the last 168 hours.  DDimer No results for input(s): "DDIMER" in the last 168 hours.   Radiology/Studies:  DG Chest 2 View  Result Date: 03/03/2023 CLINICAL DATA:  Chest pain EXAM: CHEST - 2 VIEW COMPARISON:  02/23/2023 FINDINGS: The heart size and mediastinal contours are within normal limits. Both lungs are clear. The visualized skeletal structures are unremarkable. IMPRESSION: No active cardiopulmonary disease. Electronically Signed   By: Minerva Fester M.D.   On: 03/03/2023 20:15   CARDIAC CATHETERIZATION  Result Date: 03/02/2023 Conclusions: Severe two-vessel coronary artery disease with 99%  proximal/mid OM1 stenosis and chronic total occlusion of the mid RCA with left-to-right collaterals.  Moderate mid LAD and LCx disease is similar to prior catheterizations. Upper normal left ventricular filling pressure (LVEDP 15 mmHg). Successful PCI to OM1 using Onyx Frontier 2.25 x 34 mm drug-eluting stent with 0% residual stenosis and TIMI-3 flow. Recommendations: Overnight observation with gentle postcatheterization hydration in the setting of chronic kidney disease and heart failure with mildly reduced ejection fraction. Dual antiplatelet therapy with aspirin and clopidogrel for at least 6 months. Aggressive secondary prevention of coronary artery disease. Continue current antianginal and goal-directed heart failure therapies. Yvonne Kendall, MD [facility]    Assessment and Plan:   Atypical chest pain with a longstanding history of coronary artery disease with recent stent placement -Patient presented to the emergency department with atypical chest discomfort send like a squeezing spasm type pain -EKG was unchanged -High-sensitivity troponins trended flat -No ischemic changes noted on telemetry or EKG -Recent left heart catheterization and successful PCI/DES to the proximal/mid OM1 -Continue on DAPT of aspirin and clopidogrel 75 mg daily -Continue on atorvastatin 80 mg daily, ezetimibe 10 mg daily, and carvedilol 3.125 mg twice daily -Restarted Imdur 90 mg daily this morning continue on Ranexa 500 mg twice daily -Encouraged to use Nitrostat and educated on use for breakthrough discomfort -Recommend continuing on daily PPI -No further testing or ischemic evaluation at this time  Essential hypertension -Blood pressure 178/94 -Restarted on Imdur continue PTA regimen -Vital signs per unit protocol  Mixed hyperlipidemia -Continue atorvastatin and ezetimibe  CKD stage IIIa -Serum creatinine 1.63 -Close to baseline -Daily BMP -Monitor urine output -Monitor/trend/replete  electrolytes as needed -Avoid nephrotoxic agents were able  Chronic HFrEF/ischemic and nonischemic cardiomyopathy -NYHA class II-III symptoms -Continued on carvedilol 6.25 mg twice daily, Jardiance 10 mg daily, and furosemide 20 mg as needed for weight gain.  He is also continued on Entresto 24/26 mg twice daily and Aldactone 25 mg daily -Continues to follow with advanced heart failure on outpatient basis -Encouraged to continue with daily weights, limited sodium intake and fluid intake -Prior echocardiogram completed 08/2022 revealed an LVEF of 45-50%, with wall motion abnormalities, G1 DD and no valvular abnormalities. -Appears to be euvolemic on exam today -No I's and O's have been recorded since he has been in the emergency department -Daily weights, I's and O's, low-sodium diet   Risk Assessment/Risk Scores:        New York Heart Association (NYHA) Functional Class NYHA Class II        For questions  or updates, please contact [facility] Please consult [email or web address] for contact info under    Signed, Hersh Minney, NP  03/04/2023 9:20 AM

## 2023-03-04 NOTE — Transitions of Care (Post Inpatient/ED Visit) (Signed)
03/04/2023  Name: Clarence [name] MRN: [id number] DOB: 1971/03/11  Today's TOC FU Call Status: Today's TOC FU Call Status:: Successful TOC FU Call Completed TOC FU Call Complete Date: 03/04/23 Patient's Name and Date of Birth confirmed.  Transition Care Management Follow-up Telephone Call Date of Discharge: 03/04/23 Discharge Facility: [facility]) Type of Discharge: Emergency Department Reason for ED Visit: Other: (Chest pain) How have you been since you were released from the hospital?: Same Any questions or concerns?: Yes Patient Questions/Concerns:: Patient will save qeustions for appointment  Items Reviewed: Did you receive and understand the discharge instructions provided?: Yes Medications obtained,verified, and reconciled?: Yes (Medications Reviewed) Any new allergies since your discharge?: No Dietary orders reviewed?: No Do you have support at home?: No  Medications Reviewed Today: Medications Reviewed Today     Reviewed by Dicky Doe, CMA (Certified Medical Assistant) on 03/04/23 at 1655  Med List Status: <None>   Medication Order Taking? Sig Documenting Provider Last Dose Status Informant  acetaminophen (TYLENOL) 325 MG tablet [id number] Yes Take 650 mg by mouth at bedtime. [provider] Taking Active Other  aspirin EC 81 MG tablet [id number] Yes Take 1 tablet (81 mg total) by mouth daily. Swallow whole. Lee, Anderson Malta, [name] Taking Active Other  atorvastatin (LIPITOR) 80 MG tablet [id number] Yes Take 1 tablet (80 mg total) by mouth every evening. [place], Anderson Malta, [name] Taking Active Other  carvedilol (COREG) 3.125 MG tablet [id number] Yes Take 1 tablet (3.125 mg total) by mouth 2 (two) times daily with a meal. Furth, Cadence H, PA-C Taking Active Other  clopidogrel (PLAVIX) 75 MG tablet [id number] Yes Take 1 tablet (75 mg total) by mouth daily. Laurey Morale, MD Taking Active Other  empagliflozin (JARDIANCE) 10 MG TABS  tablet [id number] Yes Take 1 tablet (10 mg total) by mouth daily. Ngetich, Dinah C, NP Taking Active Other  ezetimibe (ZETIA) 10 MG tablet [id number] Yes Take 1 tablet (10 mg total) by mouth daily. Laurey Morale, MD Taking Active Other  famotidine (PEPCID) 10 MG tablet [id number] Yes Take 10 mg by mouth at bedtime. As needed [provider] Taking Active Other  fenofibrate (TRICOR) 48 MG tablet [id number] Yes Take 1 tablet (48 mg total) by mouth daily. [place], Anderson Malta, [name] Taking Active Other  furosemide (LASIX) 20 MG tablet [id number] Yes Take 1 tablet (20 mg total) by mouth daily as needed. For weight gain of 3 lb in 24 hours or 5 lbs in one week Ngetich, Dinah C, NP Taking Active Other  gabapentin (NEURONTIN) 100 MG capsule [id number] Yes Take 1 capsule (100 mg total) by mouth 3 (three) times daily as needed. Poggi, Herb Grays, PA-C Taking Active Other  isosorbide mononitrate (IMDUR) 60 MG 24 hr tablet [id number] Yes Take 1.5 tablets (90 mg total) by mouth daily. [place], Anderson Malta, [name] Taking Active Other  nitroGLYCERIN (NITROSTAT) 0.4 MG SL tablet [id number] Yes Place 1 tablet (0.4 mg total) under the tongue every 5 (five) minutes as needed for chest pain. Ngetich, Donalee Citrin, NP Taking Active Other  ondansetron (ZOFRAN) 4 MG tablet [id number] Yes Take 1 tablet (4 mg total) by mouth every 8 (eight) hours as needed for nausea or vomiting. Ngetich, Dinah C, NP Taking Active Other  pantoprazole (PROTONIX) 40 MG tablet [id number] Yes Take 1 tablet (40 mg total) by mouth daily. Laurey Morale, MD Taking Active Other  ranolazine (RANEXA) 500 MG 12 hr tablet [id number] Yes TAKE 1 TABLET BY MOUTH  TWICE A DAY  Patient taking differently: Take 250 mg by mouth 4 (four) times daily.   Laurey Morale, MD Taking Active Other  sacubitril-valsartan (ENTRESTO) 24-26 MG [id number] Yes Take 1 tablet by mouth 2 (two) times daily. Laurey Morale, MD Taking Active Other  spironolactone (ALDACTONE) 25 MG tablet  [id number] Yes Take 1 tablet (25 mg total) by mouth daily. Laurey Morale, MD Taking Active Other            Home Care and Equipment/Supplies: Were Home Health Services Ordered?: No Any new equipment or medical supplies ordered?: No  Functional Questionnaire: Do you need assistance with bathing/showering or dressing?: No Do you need assistance with meal preparation?: No Do you need assistance with eating?: No Do you have difficulty maintaining continence: No Do you need assistance with getting out of bed/getting out of a chair/moving?: No Do you have difficulty managing or taking your medications?: No  Follow up appointments reviewed: PCP Follow-up appointment confirmed?: Yes Date of PCP follow-up appointment?: 03/09/23 Follow-up Provider: Richarda Blade, NP [facility] Follow-up appointment confirmed?: No Do you need transportation to your follow-up appointment?: No Do you understand care options if your condition(s) worsen?: Yes-patient verbalized understanding    SIGNATURE: [place].D/RMA

## 2023-03-06 NOTE — Plan of Care (Signed)
[facility] Tonsillectomy/Adenoidectomy, Postoperative PEDS care plan entered in error.

## 2023-03-09 ENCOUNTER — Encounter: Payer: Self-pay | Admitting: Family

## 2023-03-09 ENCOUNTER — Ambulatory Visit: Payer: Medicaid Other | Admitting: Family

## 2023-03-09 VITALS — BP 126/80 | HR 76 | Temp 96.7°F | Resp 20 | Ht 70.0 in | Wt 237.6 lb

## 2023-03-09 DIAGNOSIS — I2510 Atherosclerotic heart disease of native coronary artery without angina pectoris: Secondary | ICD-10-CM

## 2023-03-09 DIAGNOSIS — E782 Mixed hyperlipidemia: Secondary | ICD-10-CM | POA: Diagnosis not present

## 2023-03-09 DIAGNOSIS — I5022 Chronic systolic (congestive) heart failure: Secondary | ICD-10-CM

## 2023-03-09 DIAGNOSIS — I10 Essential (primary) hypertension: Secondary | ICD-10-CM | POA: Diagnosis not present

## 2023-03-09 MED ORDER — EMPAGLIFLOZIN 10 MG PO TABS
10.0000 mg | ORAL_TABLET | Freq: Every day | ORAL | 0 refills | Status: DC
Start: 2023-03-09 — End: 2023-06-14

## 2023-03-09 NOTE — Progress Notes (Unsigned)
Provider: Richarda Blade [name]-C  Tashera Montalvo, Donalee Citrin, NP  Patient Care Team: Virgene Tirone, Donalee Citrin, NP as PCP - General (Family Medicine) End, Cristal Deer, MD as PCP - Cardiology (Cardiology) Laurey Morale, MD as PCP - Advanced Heart Failure (Cardiology)  Extended Emergency Contact Information Primary Emergency Contact: [facility] Phone: [phone] Relation: Uncle  Code Status:  Full Code  Goals of care: Advanced Directive information    03/03/2023    4:34 PM  Advanced Directives  Does Patient Have a Medical Advance Directive? No     Chief Complaint  Patient presents with   Acute Visit    Patient presents today for emergency room follow-up. He was seen at [facility] on 03/02/23-03/03/23 for chest pain.    HPI:  Pt is a 52 y.o. male seen today for an acute visit for follow up Hospitalization from 03/02/2023 - 03/03/2023 for chest pain At [facility]. His EKG showed no changes from previous.CXR was negative for acute abnormalities. Troponin was elevated 65 > 61  CR 1,63; 1.56 He was status post Cardiac Catheterization previous day with Dr.End and had a stent placed to his OM. States chest pain has improved.sometime has very mild sensation. He was started on Imdur 90 mg tablet which seems to help.Has not required Nitro. Has been caring the mother who was recently discharge from skilled Rehab after she had DVT and PNA. States was stressed out by the facility adm staff.  Has upcoming appointment with cardiologist and Heart Failure clinic.  Past Medical History:  Diagnosis Date   Ascending Aortic Dilation    a. 09/2021 Echo: Asc Ao 40mm.   CAD (coronary artery disease)    a. 07/2021 Cath: LM nl, LAD 63m, D2 40, LCX 65p/m, OM1 90, RCA 143m w/ L-.R collats to RPDA from septal 1/2-->Med rx.   Chronic HFrEF (heart failure with reduced ejection fraction) (HCC)    a. 07/2021 Echo: EF 30-35%, glob HK, mod LVH, GrII DD, nl RV fxn, mildly  dil LA, mild-mod MR; b. 09/2021 Echo: EF 35-40%, glob HK, mod LVH, GrI DD, nl RV fxn, mildly dil RA, mild MR, Asc Ao 40mm.   CKD (chronic kidney disease) stage 2, GFR 60-89 ml/min    Crohn's disease (HCC)    Hypertension    Mixed Ischemic & Nonischemic cardiomyopathy    a. 07/2021 Echo: EF 30-35%; b. 07/2021 Cath: Occluded RCA w/ mod LAD/LCX dzs, and severe OM1 dzs-->Med Rx; c. 09/2021 Echo: EF 35-40%.   PAH (pulmonary artery hypertension) (HCC)    a. 07/2021 RHC: PA 67/30 (42).   Proteinuria    Past Surgical History:  Procedure Laterality Date   COLECTOMY     CORONARY STENT INTERVENTION N/A 03/02/2023   Procedure: CORONARY STENT INTERVENTION;  Surgeon: Yvonne Kendall, MD;  Location: [facility] INVASIVE CV LAB;  Service: Cardiovascular;  Laterality: N/A;   LEFT HEART CATH AND CORONARY ANGIOGRAPHY Left 03/02/2023   Procedure: LEFT HEART CATH AND CORONARY ANGIOGRAPHY;  Surgeon: Yvonne Kendall, MD;  Location: [facility] INVASIVE CV LAB;  Service: Cardiovascular;  Laterality: Left;   RIGHT/LEFT HEART CATH AND CORONARY ANGIOGRAPHY N/A 08/07/2021   Procedure: RIGHT/LEFT HEART CATH AND CORONARY ANGIOGRAPHY;  Surgeon: Yvonne Kendall, MD;  Location: [facility] INVASIVE CV LAB;  Service: Cardiovascular;  Laterality: N/A;   RIGHT/LEFT HEART CATH AND CORONARY ANGIOGRAPHY N/A 09/29/2022   Procedure: RIGHT/LEFT HEART CATH AND CORONARY ANGIOGRAPHY;  Surgeon: Laurey Morale, MD;  Location: [facility] INVASIVE CV LAB;  Service: Cardiovascular;  Laterality:  N/A;    Allergies  Allergen Reactions   Ibuprofen Other (See Comments)    History of GIB with ibuprofen    Sulfacetamide Other (See Comments)   Neomycin-Bacitracin Zn-Polymyx     Other reaction(s): Other (See Comments)   Spironolactone     Hyperkalemia    Outpatient Encounter Medications as of 03/09/2023  Medication Sig   acetaminophen (TYLENOL) 325 MG tablet Take 650 mg by mouth at bedtime.   aspirin EC 81 MG tablet Take 1 tablet (81 mg total) by mouth daily. Swallow  whole.   atorvastatin (LIPITOR) 80 MG tablet Take 1 tablet (80 mg total) by mouth every evening.   carvedilol (COREG) 3.125 MG tablet Take 1 tablet (3.125 mg total) by mouth 2 (two) times daily with a meal.   clopidogrel (PLAVIX) 75 MG tablet Take 1 tablet (75 mg total) by mouth daily.   empagliflozin (JARDIANCE) 10 MG TABS tablet Take 1 tablet (10 mg total) by mouth daily.   ezetimibe (ZETIA) 10 MG tablet Take 1 tablet (10 mg total) by mouth daily.   famotidine (PEPCID) 10 MG tablet Take 10 mg by mouth at bedtime. As needed   fenofibrate (TRICOR) 48 MG tablet Take 1 tablet (48 mg total) by mouth daily.   furosemide (LASIX) 20 MG tablet Take 1 tablet (20 mg total) by mouth daily as needed. For weight gain of 3 lb in 24 hours or 5 lbs in one week   gabapentin (NEURONTIN) 100 MG capsule Take 1 capsule (100 mg total) by mouth 3 (three) times daily as needed.   isosorbide mononitrate (IMDUR) 60 MG 24 hr tablet Take 1.5 tablets (90 mg total) by mouth daily.   nitroGLYCERIN (NITROSTAT) 0.4 MG SL tablet Place 1 tablet (0.4 mg total) under the tongue every 5 (five) minutes as needed for chest pain.   ondansetron (ZOFRAN) 4 MG tablet Take 1 tablet (4 mg total) by mouth every 8 (eight) hours as needed for nausea or vomiting.   pantoprazole (PROTONIX) 40 MG tablet Take 1 tablet (40 mg total) by mouth daily.   ranolazine (RANEXA) 500 MG 12 hr tablet TAKE 1 TABLET BY MOUTH TWICE A DAY (Patient taking differently: Take 250 mg by mouth 4 (four) times daily.)   sacubitril-valsartan (ENTRESTO) 24-26 MG Take 1 tablet by mouth 2 (two) times daily.   spironolactone (ALDACTONE) 25 MG tablet Take 1 tablet (25 mg total) by mouth daily.   No facility-administered encounter medications on file as of 03/09/2023.    Review of Systems  Constitutional:  Negative for appetite change, chills, fatigue, fever and unexpected weight change.  HENT:  Negative for congestion, dental problem, ear discharge, ear pain, facial  swelling, hearing loss, nosebleeds, postnasal drip, rhinorrhea, sinus pressure, sinus pain, sneezing, sore throat, tinnitus and trouble swallowing.   Eyes:  Negative for pain, discharge, redness, itching and visual disturbance.  Respiratory:  Negative for cough, chest tightness, shortness of breath and wheezing.   Cardiovascular:  Negative for chest pain, palpitations and leg swelling.  Gastrointestinal:  Negative for abdominal distention, abdominal pain, blood in stool, constipation, diarrhea, nausea and vomiting.  Endocrine: Negative for cold intolerance, heat intolerance, polydipsia, polyphagia and polyuria.  Genitourinary:  Negative for difficulty urinating, dysuria, flank pain, frequency and urgency.  Musculoskeletal:  Negative for arthralgias, back pain, gait problem, joint swelling, myalgias, neck pain and neck stiffness.  Skin:  Negative for color change, pallor, rash and wound.  Neurological:  Negative for dizziness, syncope, speech difficulty, weakness, light-headedness, numbness and  headaches.  Hematological:  Does not bruise/bleed easily.  Psychiatric/Behavioral:  Negative for agitation, behavioral problems, confusion, hallucinations, self-injury, sleep disturbance and suicidal ideas. The patient is not nervous/anxious.     Immunization History  Administered Date(s) Administered   Moderna SARS-COV2 Booster Vaccination 04/21/2020, 01/07/2021   Tdap 05/18/2013   Zoster Recombinant(Shingrix) 01/07/2021, 07/29/2022   Pertinent  Health Maintenance Due  Topic Date Due   INFLUENZA VACCINE  Never done      01/11/2022   12:56 AM 07/15/2022    8:39 AM 07/28/2022    2:14 PM 09/18/2022    2:51 PM 01/05/2023    1:56 PM  Fall Risk  Falls in the past year?  0 0 0 0  Was there an injury with Fall?  0 0 0 0  Fall Risk Category Calculator  0 0 0 0  (RETIRED) Patient Fall Risk Level Low fall risk      Patient at Risk for Falls Due to  No Fall Risks  No Fall Risks No Fall Risks  Fall risk  Follow up  Falls evaluation completed  Falls evaluation completed Falls evaluation completed   Functional Status Survey:    Vitals:   03/09/23 1419  BP: 126/80  Pulse: 76  Resp: 20  Temp: (!) 96.7 F (35.9 C)  SpO2: 97%  Weight: 237 lb 9.6 oz (107.8 kg)  Height: 5\' 10"  (1.778 m)   Body mass index is 34.09 kg/m. Physical Exam Vitals reviewed.  Constitutional:      General: He is not in acute distress.    Appearance: Normal appearance. He is obese. He is not ill-appearing or diaphoretic.  HENT:     Head: Normocephalic.     Right Ear: Tympanic membrane, ear canal and external ear normal. There is no impacted cerumen.     Left Ear: Tympanic membrane, ear canal and external ear normal. There is no impacted cerumen.     Nose: Nose normal. No congestion or rhinorrhea.     Mouth/Throat:     Mouth: Mucous membranes are moist.     Pharynx: Oropharynx is clear. No oropharyngeal exudate or posterior oropharyngeal erythema.  Eyes:     General: No scleral icterus.       Right eye: No discharge.        Left eye: No discharge.     Extraocular Movements: Extraocular movements intact.     Conjunctiva/sclera: Conjunctivae normal.     Pupils: Pupils are equal, round, and reactive to light.  Neck:     Vascular: No carotid bruit.  Cardiovascular:     Rate and Rhythm: Normal rate and regular rhythm.     Pulses: Normal pulses.     Heart sounds: Normal heart sounds. No murmur heard.    No friction rub. No gallop.  Pulmonary:     Effort: Pulmonary effort is normal. No respiratory distress.     Breath sounds: Normal breath sounds. No wheezing, rhonchi or rales.  Chest:     Chest wall: No tenderness.  Abdominal:     General: Bowel sounds are normal. There is no distension.     Palpations: Abdomen is soft. There is no mass.     Tenderness: There is no abdominal tenderness. There is no right CVA tenderness, left CVA tenderness, guarding or rebound.     Comments: Right Colostomy patent    Musculoskeletal:        General: No swelling or tenderness. Normal range of motion.     Cervical back: Normal  range of motion. No rigidity or tenderness.     Right lower leg: No edema.     Left lower leg: No edema.  Lymphadenopathy:     Cervical: No cervical adenopathy.  Skin:    General: Skin is warm and dry.     Coloration: Skin is not pale.     Findings: No bruising, erythema, lesion or rash.  Neurological:     Mental Status: He is alert and oriented to person, place, and time.     Cranial Nerves: No cranial nerve deficit.     Sensory: No sensory deficit.     Motor: No weakness.     Coordination: Coordination normal.     Gait: Gait normal.  Psychiatric:        Mood and Affect: Mood normal.        Speech: Speech normal.        Behavior: Behavior normal.        Thought Content: Thought content normal.        Judgment: Judgment normal.     Labs reviewed: Recent Labs    09/10/22 1846 09/11/22 0541 02/25/23 1107 03/03/23 0456 03/03/23 1634  NA 137   < > 142 137 140  K 5.3*   < > 4.8 3.8 4.2  CL 109   < > 103 107 104  CO2 23   < > 22 23 25   GLUCOSE 123*   < > 122* 136* 102*  BUN 36*   < > 19 27* 23*  CREATININE 2.28*   < > 1.57* 1.56* 1.63*  CALCIUM 9.8   < > 9.9 8.8* 9.6  MG 2.2  --   --   --   --    < > = values in this interval not displayed.   Recent Labs    09/07/22 1725 09/11/22 0541 02/23/23 1803  AST 26 20 25   ALT 31 31 32  ALKPHOS 92 65 72  BILITOT 0.8 0.7 0.8  PROT 7.6 6.5 6.9  ALBUMIN 4.0 3.4* 3.8   Recent Labs    09/07/22 1725 09/10/22 1846 10/02/22 1446 11/06/22 1412 12/28/22 1500 02/23/23 1803 02/25/23 1107 03/03/23 0456 03/03/23 1634  WBC 7.5   < > 6.1   < > 7.6   < > 6.5 6.1 6.5  NEUTROABS 5.0  --  3,489  --  4.8  --   --   --   --   HGB 15.8   < > 14.6   < > 14.9   < > 14.8 13.5 14.1  HCT 47.6   < > 43.4   < > 44.8   < > 44.6 40.1 41.5  MCV 83.8   < > 83.3   < > 84.2   < > 86 83.0 83.0  PLT 242   < > 223   < > 217   < > 232 173  197   < > = values in this interval not displayed.   Lab Results  Component Value Date   TSH 0.86 07/15/2022   Lab Results  Component Value Date   HGBA1C 5.4 08/05/2021   Lab Results  Component Value Date   CHOL 125 09/21/2022   HDL 33 (L) 09/21/2022   LDLCALC 59 09/21/2022   LDLDIRECT 69.0 09/09/2021   TRIG 165 (H) 09/21/2022   CHOLHDL 3.8 09/21/2022    Significant Diagnostic Results in last 30 days:  DG Chest 2 View  Result Date: 03/03/2023 CLINICAL DATA:  Chest pain EXAM:  CHEST - 2 VIEW COMPARISON:  02/23/2023 FINDINGS: The heart size and mediastinal contours are within normal limits. Both lungs are clear. The visualized skeletal structures are unremarkable. IMPRESSION: No active cardiopulmonary disease. Electronically Signed   By: Minerva Fester M.D.   On: 03/03/2023 20:15   CARDIAC CATHETERIZATION  Result Date: 03/02/2023 Conclusions: Severe two-vessel coronary artery disease with 99% proximal/mid OM1 stenosis and chronic total occlusion of the mid RCA with left-to-right collaterals.  Moderate mid LAD and LCx disease is similar to prior catheterizations. Upper normal left ventricular filling pressure (LVEDP 15 mmHg). Successful PCI to OM1 using Onyx Frontier 2.25 x 34 mm drug-eluting stent with 0% residual stenosis and TIMI-3 flow. Recommendations: Overnight observation with gentle postcatheterization hydration in the setting of chronic kidney disease and heart failure with mildly reduced ejection fraction. Dual antiplatelet therapy with aspirin and clopidogrel for at least 6 months. Aggressive secondary prevention of coronary artery disease. Continue current antianginal and goal-directed heart failure therapies. Yvonne Kendall, MD [facility]  DG Chest 1 View  Result Date: 02/23/2023 CLINICAL DATA:  Chest pain. EXAM: CHEST  1 VIEW COMPARISON:  11/06/2022 FINDINGS: The heart size and mediastinal contours are within normal limits. There is no evidence of pulmonary edema,  consolidation, pneumothorax or pleural fluid. The visualized skeletal structures are unremarkable. IMPRESSION: No active disease. Electronically Signed   By: Irish Lack M.D.   On: 02/23/2023 18:23    Assessment/Plan 1. Essential hypertension B/p well controlled  - continue on Spirolcatone,Enterostro,Furosemide and Carvedilol - Basic metabolic panel - CBC with Differential/Platelet - TSH  2. Chronic HFrEF (heart failure with reduced ejection fraction) (HCC) No signs of fluid overload   - check weight at least three times per week and notify provider for any abrupt weight gain > 3 lbs  - Reduce salt intake in diet    3. Coronary artery disease involving native coronary artery of native heart, unspecified whether angina present ***  4. Mixed hyperlipidemia *** - Lipid Panel    Family/ staff Communication: Reviewed plan of care with patient verbalized understanding   Labs/tests ordered: None   Next Appointment: Return in about 6 months (around 09/07/2023) for medical mangement of chronic issues.Caesar Bookman, NP

## 2023-03-10 LAB — CBC WITH DIFFERENTIAL/PLATELET
Absolute Lymphocytes: 1694 {cells}/uL (ref 850–3900)
Absolute Monocytes: 635 {cells}/uL (ref 200–950)
Basophils Absolute: 29 {cells}/uL (ref 0–200)
Basophils Relative: 0.4 %
Eosinophils Absolute: 131 {cells}/uL (ref 15–500)
Eosinophils Relative: 1.8 %
HCT: 44.3 % (ref 38.5–50.0)
Hemoglobin: 14.2 g/dL (ref 13.2–17.1)
MCH: 27.6 pg (ref 27.0–33.0)
MCHC: 32.1 g/dL (ref 32.0–36.0)
MCV: 86.2 fL (ref 80.0–100.0)
MPV: 10.7 fL (ref 7.5–12.5)
Monocytes Relative: 8.7 %
Neutro Abs: 4811 {cells}/uL (ref 1500–7800)
Neutrophils Relative %: 65.9 %
Platelets: 238 10*3/uL (ref 140–400)
RBC: 5.14 10*6/uL (ref 4.20–5.80)
RDW: 13.7 % (ref 11.0–15.0)
Total Lymphocyte: 23.2 %
WBC: 7.3 10*3/uL (ref 3.8–10.8)

## 2023-03-10 LAB — BASIC METABOLIC PANEL
BUN/Creatinine Ratio: 14 (calc) (ref 6–22)
BUN: 30 mg/dL — ABNORMAL HIGH (ref 7–25)
CO2: 26 mmol/L (ref 20–32)
Calcium: 10 mg/dL (ref 8.6–10.3)
Chloride: 106 mmol/L (ref 98–110)
Creat: 2.11 mg/dL — ABNORMAL HIGH (ref 0.70–1.30)
Glucose, Bld: 109 mg/dL (ref 65–139)
Potassium: 4.1 mmol/L (ref 3.5–5.3)
Sodium: 142 mmol/L (ref 135–146)

## 2023-03-10 LAB — LIPID PANEL
Cholesterol: 146 mg/dL (ref <200)
HDL: 37 mg/dL — ABNORMAL LOW (ref >=40)
LDL Cholesterol (Calc): 82 mg/dL
Non-HDL Cholesterol (Calc): 109 mg/dL (ref <130)
Total CHOL/HDL Ratio: 3.9 (calc) (ref <5.0)
Triglycerides: 173 mg/dL — ABNORMAL HIGH (ref <150)

## 2023-03-10 LAB — TSH: TSH: 0.84 m[IU]/L (ref 0.40–4.50)

## 2023-03-12 ENCOUNTER — Telehealth: Payer: Self-pay | Admitting: Internal Medicine

## 2023-03-12 NOTE — Telephone Encounter (Signed)
Patient is concerned about a bump on his right wrist that has appeared after having stint inserted.Please call to discuss.

## 2023-03-12 NOTE — Telephone Encounter (Signed)
Spoke to patient and informed him that you may have a small lump at the puncture site. This is normal. The lump will get smaller and go away over the next 2 to 4 weeks. You may have bruising near the puncture site but not to be alarmed and that he would be reevaluated at his follow-up appointment 03/23/23. Instructed patient to keep incision site clean and dry, wash the catheter insertion site at least once daily with soap and water. Place soapy water on your hand or washcloth and gently wash the insertion site; do not rub. Do not use creams, lotions or ointment on the wound site. Do not take a bath, tub soak, go in a Jacuzzi, or swim in a pool or lake for one week after the procedure. Patient understood with read back

## 2023-03-15 ENCOUNTER — Other Ambulatory Visit: Payer: Self-pay

## 2023-03-15 DIAGNOSIS — N182 Chronic kidney disease, stage 2 (mild): Secondary | ICD-10-CM

## 2023-03-15 DIAGNOSIS — N289 Disorder of kidney and ureter, unspecified: Secondary | ICD-10-CM

## 2023-03-16 ENCOUNTER — Other Ambulatory Visit: Payer: Self-pay | Admitting: Family

## 2023-03-16 ENCOUNTER — Other Ambulatory Visit: Payer: Self-pay

## 2023-03-16 ENCOUNTER — Other Ambulatory Visit (HOSPITAL_COMMUNITY): Payer: Self-pay | Admitting: Family Medicine

## 2023-03-16 ENCOUNTER — Other Ambulatory Visit (HOSPITAL_COMMUNITY): Payer: Self-pay

## 2023-03-16 MED ORDER — FENOFIBRATE 48 MG PO TABS
48.0000 mg | ORAL_TABLET | Freq: Every day | ORAL | 8 refills | Status: DC
  Filled 2023-03-16: qty 30, 30d supply, fill #0
  Filled 2023-04-11: qty 30, 30d supply, fill #1
  Filled 2023-05-17 (×2): qty 30, 30d supply, fill #2
  Filled 2023-06-22: qty 30, 30d supply, fill #3

## 2023-03-17 ENCOUNTER — Other Ambulatory Visit (HOSPITAL_COMMUNITY): Payer: Self-pay

## 2023-03-17 MED ORDER — ONDANSETRON HCL 4 MG PO TABS
4.0000 mg | ORAL_TABLET | Freq: Three times a day (TID) | ORAL | 0 refills | Status: DC | PRN
  Filled 2023-03-17: qty 20, 7d supply, fill #0

## 2023-03-17 NOTE — Telephone Encounter (Signed)
Patient has request refill on medication and was recently given 20 tablets. Medication pend and sent to PCP Ngetich, Donalee Citrin, NP for approval.

## 2023-03-23 ENCOUNTER — Encounter: Payer: Self-pay | Admitting: Cardiology

## 2023-03-23 ENCOUNTER — Ambulatory Visit: Payer: Medicaid Other | Attending: Cardiology | Admitting: Cardiology

## 2023-03-23 VITALS — BP 120/86 | HR 95 | Ht 70.0 in | Wt 238.4 lb

## 2023-03-23 DIAGNOSIS — I5022 Chronic systolic (congestive) heart failure: Secondary | ICD-10-CM | POA: Diagnosis not present

## 2023-03-23 DIAGNOSIS — E782 Mixed hyperlipidemia: Secondary | ICD-10-CM

## 2023-03-23 DIAGNOSIS — I25118 Atherosclerotic heart disease of native coronary artery with other forms of angina pectoris: Secondary | ICD-10-CM

## 2023-03-23 DIAGNOSIS — N183 Chronic kidney disease, stage 3 unspecified: Secondary | ICD-10-CM

## 2023-03-23 DIAGNOSIS — I10 Essential (primary) hypertension: Secondary | ICD-10-CM | POA: Diagnosis not present

## 2023-03-23 NOTE — Progress Notes (Signed)
Cardiology Office Note:  .   Date:  03/23/2023  ID:  Clarence [name], DOB [birth year], MRN [id number] PCP: Caesar Bookman, NP  [facility] Providers Cardiologist:  Yvonne Kendall, MD Advanced Heart Failure:  Marca Ancona, MD    History of Present Illness: .   Clarence [name] is a 52 y.o. male with a past medical history of coronary disease, mixed ischemic and nonischemic cardiomyopathy, chronic HFrEF (EF 35 to 40%), pulmonary hypertension, chronic stage II kidney disease, hypertension, hyperlipidemia, Graves' disease s/p colectomy with ostomy, who presents today for follow-up after recent heart catheterization.  He was initially admitted in March 2023 with progressive dyspnea over the preceding several months was found to have a new cardiomyopathy with atypical pneumonia.  High-sensitivity troponin peaked at 89%.  Echocardiogram revealed LVEF of 30-25% with global hypokinesis and G2 DD.  He was diuresed and GDMT was initiated.  He underwent R/LHC which revealed an occluded RCA, small severe OM1 disease, moderate LAD and circumflex disease and left-to-right collaterals of the RPDA.  He had elevated filling pressures with moderate to severe pulmonary hypertension and moderately reduced cardiac output and index.  Clinical picture was consistent with mixed ischemic and nonischemic cardiomyopathy and he was medically managed.  Admitted again in May 2023 with chest pain and hypertensive urgency.  Repeat echocardiogram showed EF 35 to 40%.  Renal artery duplex was concerning for possible right renal artery stenosis.  CT of the abdomen pelvis showed no acute abnormality or significant renal artery stenosis.  Admitted again in June 2023 with hypertensive urgency and demand ischemia was medically managed.  Quit smoking 3/23 admission and rarely drinks EtOH denies illicit drug use.  Prior sleep study was completed but was not suggestive of OSA.  He was again then readmitted on 4/24 with chest pain and  no evidence of ACS.  Echocardiogram 4/24 showed EF 45-50%, inferior and inferior lateral hypokinesis and normal RV.  R/LHC was done 5/24 showing occluded RCA with collaterals, 90% stenosis with severe diffuse OM1, 50% M LAD, 60% proximal to mid left circumflex, normal filling pressures and preserved cardiac output.  He was evaluated again in the emergency room 11/06/2022 with atypical chest pain with a negative workup.  He returned on 11/10/2022 with lightheadedness.  Blood pressure was stable in the ER the patient states that her blood pressure was low in the 80s at home.  Blood pressure medications were adjusted.  He was evaluated at [facility] emergency department 02/22/2013 with a complaint of chest pain.  Complaining of left-sided chest pain was described as a pressure sensation rating to his left arm.  Vital signs were stable workup was unrevealing and he was discharged home with close outpatient follow-up.  Continues to follow-up with advanced heart failure.  He was last seen in clinic 02/25/2023.  He been having continued complaints of chest discomfort.  Blood pressures have been elevated as well.  With changes in EKG and continued symptoms he was scheduled for a right and left heart catheterization that was completed on 03/02/2023.  He had labs that were completed at that time but no other medication changes were made.  He was discharged from the facility on 03/03/2023 after his left right heart catheterization were completed.  Overnight observation with gentle hydration in the setting of chronic kidney disease and heart failure with mildly reduced ejection fraction was done overnight.  It was recommended he continue with dual antiplatelet therapy with aspirin and clopidogrel for at least 6 months.  Aggressive secondary prevention of coronary artery disease.  He was also encouraged to continue antianginal goal-directed heart failure therapies.  He was again referred to cardiac rehab.  He presented back to the  emergency department on 03/04/2023 with continuation of chest pain.  He states he started having chest discomfort 30 minutes prior to arrival.  He describes as a wave of pressure sensation in his entire left side of his chest which feels different from prior episodes of chest pain in the past.  Blood pressure was suboptimally controlled.  Serum creatinine 1.6, high-sensitivity troponin of 65 and EKG was unrevealing.  He was treated with sublingual nitroglycerin and Overnight for observation.  His chest pain resolved spontaneously and he was considered stable for discharge on 03/04/2023.  He returns to clinic today stating that overall he has been doing well.  He denies any recurrent chest discomfort like he was having before.  He stated he is having occasional sharp stabbing pains that come and go but nothing like the discomfort he was having before.  Occasional shortness of breath that is unchanged but denies any swelling or palpitations.  States his stress has improved since mother is no longer in rehab for PE and she is at home now.  Blood pressure has been better controlled as well.  Has been compliant with his medications.  ROS: 10 point review of systems has been reviewed and considered negative with exception of what is been listed in the HPI  Studies Reviewed: Marland Kitchen   EKG Interpretation Date/Time:  Tuesday March 23 2023 14:29:50 EST Ventricular Rate:  95 PR Interval:  160 QRS Duration:  108 QT Interval:  384 QTC Calculation: 482 R Axis:   -20  Text Interpretation: Normal sinus rhythm Possible Left atrial enlargement Lateral infarct , age undetermined Inferior infarct , age undetermined When compared with ECG of 03-Mar-2023 16:29, PREVIOUS ECG IS PRESENT Confirmed by Charlsie Quest ([id number]) on 03/23/2023 2:40:45 PM   R/LHC 03/02/23 Conclusions: Severe two-vessel coronary artery disease with 99% proximal/mid OM1 stenosis and chronic total occlusion of the mid RCA with left-to-right collaterals.   Moderate mid LAD and LCx disease is similar to prior catheterizations. Upper normal left ventricular filling pressure (LVEDP 15 mmHg). Successful PCI to OM1 using Onyx Frontier 2.25 x 34 mm drug-eluting stent with 0% residual stenosis and TIMI-3 flow.   Recommendations: Overnight observation with gentle postcatheterization hydration in the setting of chronic kidney disease and heart failure with mildly reduced ejection fraction. Dual antiplatelet therapy with aspirin and clopidogrel for at least 6 months. Aggressive secondary prevention of coronary artery disease. Continue current antianginal and goal-directed heart failure therapies.  R/LHC 09/29/2022   Mid RCA lesion is 100% stenosed.   2nd Diag lesion is 40% stenosed.   1st Mrg lesion is 90% stenosed.   Mid LAD lesion is 50% stenosed.   Prox Cx to Mid Cx lesion is 60% stenosed.   1. Normal filling pressures.  2. Preserved cardiac output.  3. Coronaries unchanged from prior study.  Has CTO RCA with collaterals.  There is severe, diffuse disease of OM1 with nonobstructive disease in the LAD and LCx.  Continue medical management.    TTE 09/11/22 1. Left ventricular ejection fraction, by estimation, is 45 to 50%. The  left ventricle has mildly decreased function. The left ventricle  demonstrates regional wall motion abnormalities (hypokinesis of the  inferior and inferolateral wall). There is  moderate left ventricular hypertrophy. Left ventricular diastolic  parameters are consistent with Grade I diastolic  dysfunction (impaired  relaxation).   2. Right ventricular systolic function is normal. The right ventricular  size is normal. Tricuspid regurgitation signal is inadequate for assessing  PA pressure.   3. The mitral valve is normal in structure. No evidence of mitral valve  regurgitation. No evidence of mitral stenosis.   4. The aortic valve is tricuspid. Aortic valve regurgitation is not  visualized. No aortic stenosis is  present.   5. The inferior vena cava is normal in size with greater than 50%  respiratory variability, suggesting right atrial pressure of 3 mmHg.  Risk Assessment/Calculations:             Physical Exam:   VS:  BP 120/86 (BP Location: Left Arm, Patient Position: Sitting, Cuff Size: Normal)   Pulse 95   Ht 5\' 10"  (1.778 m)   Wt 238 lb 6.4 oz (108.1 kg)   SpO2 97%   BMI 34.21 kg/m    Wt Readings from Last 3 Encounters:  03/23/23 238 lb 6.4 oz (108.1 kg)  03/09/23 237 lb 9.6 oz (107.8 kg)  03/03/23 235 lb 14.3 oz (107 kg)    GEN: Well nourished, well developed in no acute distress NECK: No JVD; No carotid bruits CARDIAC: RRR, no murmurs, rubs, gallops RESPIRATORY:  Clear to auscultation without rales, wheezing or rhonchi  ABDOMEN: Soft, non-tender, non-distended EXTREMITIES:  No edema; No deformity   ASSESSMENT AND PLAN: .   Coronary artery disease of native coronary artery with stable angina.  EKG today reveals sinus rhythm with left atrial enlargement lateral inferior infarct.  He recently underwent repeat right and left heart catheterization which she was to continue current antianginal and goal-directed heart failure therapies and continued on dual antiplatelet with aspirin and clopidogrel for minimum of 6 months.  Cath revealed severe two-vessel coronary disease 99% proximal/mid OM1 stenosis and chronic total occlusion of the mid RCA with left-to-right collaterals.  Moderate mid LAD and left circumflex disease similar to prior catheterizations.  Successful PCI to the OM1 using DES with 0% residual stenosis.  He has been scheduled for follow-up CBC and BMP today as he required general hydration postprocedure return to his hospitalization.  He is also being referred to cardiac rehab.  He is continued on aspirin 81 mg daily, Lipitor 80 mg daily, clopidogrel 75 mg daily, ezetimibe 10 mg daily, Tricor 48 mg daily, and Imdur 90 mg daily with Ranexa 500 mg twice daily.  Chronic  HFrEF/ischemic and nonischemic cardiomyopathy with NYHA class II symptoms.  He continues to follow with advanced heart failure.  He is continued on carvedilol 3.125 mg twice daily, Jardiance 10 mg daily, and furosemide 20 mg as needed for weight gain along with Entresto 24/26 mg twice daily and Aldactone 25 mg daily.  He has been encouraged to continue to monitor his weight and limit his fluid and sodium intake.  Hypertension with blood pressure today 120/86.  Blood pressures have remained stable.  He denies had spikes in his blood pressure at home.  He has been continued on carvedilol, furosemide, Imdur, Ranexa, Entresto, spironolactone.  He has also been encouraged to continue to monitor his blood pressure at home 1 to 2 hours postmedication administration.  Mixed hyperlipidemia with an LDL 59 on he is continued on atorvastatin 80 mg daily, ezetimibe 10 mg daily, fenofibrate 48 mg daily.  Stage III chronic kidney disease with history of hyperkalemia.  Slight bump in his serum creatinine 2.11 on 03/09/2023.  Being sent for repeat BMP today.  I reminded him to avoid NSAIDs as well.       Dispo: Patient to return to clinic to see MD/APP in 3 months or sooner if needed for reevaluation of symptoms  Signed, Shikha Bibb, NP

## 2023-03-23 NOTE — Patient Instructions (Signed)
Medication Instructions:  Your physician recommends that you continue on your current medications as directed. Please refer to the Current Medication list given to you today.   *If you need a refill on your cardiac medications before your next appointment, please call your pharmacy*   Lab Work: Your provider would like for you to have following labs drawn today (CBC, BMP).     Testing/Procedures: No test ordered today    Follow-Up: At [facility], you and your health needs are our priority.  As part of our continuing mission to provide you with exceptional heart care, we have created designated Provider Care Teams.  These Care Teams include your primary Cardiologist (physician) and Advanced Practice Providers (APPs -  Physician Assistants and Nurse Practitioners) who all work together to provide you with the care you need, when you need it.  We recommend signing up for the patient portal called "MyChart".  Sign up information is provided on this After Visit Summary.  MyChart is used to connect with patients for Virtual Visits (Telemedicine).  Patients are able to view lab/test results, encounter notes, upcoming appointments, etc.  Non-urgent messages can be sent to your provider as well.   To learn more about what you can do with MyChart, go to [email or web address].    Your next appointment:   3 month(s)  Provider:   You may see Yvonne Kendall, MD or one of the following Advanced Practice Providers on your designated Care Team:   Nicolasa Ducking, NP Eula Listen, PA-C Cadence Fransico Michael, PA-C Charlsie Quest, NP Carlos Levering, NP

## 2023-03-24 LAB — CBC WITH DIFFERENTIAL/PLATELET
Basophils Absolute: 0 10*3/uL (ref 0.0–0.2)
Basos: 1 %
EOS (ABSOLUTE): 0.2 10*3/uL (ref 0.0–0.4)
Eos: 3 %
Hematocrit: 45.4 % (ref 37.5–51.0)
Hemoglobin: 14.8 g/dL (ref 13.0–17.7)
Immature Grans (Abs): 0 10*3/uL (ref 0.0–0.1)
Immature Granulocytes: 0 %
Lymphocytes Absolute: 1.6 10*3/uL (ref 0.7–3.1)
Lymphs: 22 %
MCH: 28.1 pg (ref 26.6–33.0)
MCHC: 32.6 g/dL (ref 31.5–35.7)
MCV: 86 fL (ref 79–97)
Monocytes Absolute: 0.7 10*3/uL (ref 0.1–0.9)
Monocytes: 9 %
Neutrophils Absolute: 4.9 10*3/uL (ref 1.4–7.0)
Neutrophils: 65 %
Platelets: 269 10*3/uL (ref 150–450)
RBC: 5.27 x10E6/uL (ref 4.14–5.80)
RDW: 13.7 % (ref 11.6–15.4)
WBC: 7.4 10*3/uL (ref 3.4–10.8)

## 2023-03-24 LAB — BASIC METABOLIC PANEL
BUN/Creatinine Ratio: 15 (ref 9–20)
BUN: 25 mg/dL — ABNORMAL HIGH (ref 6–24)
CO2: 22 mmol/L (ref 20–29)
Calcium: 10.2 mg/dL (ref 8.7–10.2)
Chloride: 103 mmol/L (ref 96–106)
Creatinine, Ser: 1.64 mg/dL — ABNORMAL HIGH (ref 0.76–1.27)
Glucose: 105 mg/dL — ABNORMAL HIGH (ref 70–99)
Potassium: 4.7 mmol/L (ref 3.5–5.2)
Sodium: 144 mmol/L (ref 134–144)
eGFR: 49 mL/min/{1.73_m2} — ABNORMAL LOW (ref >59)

## 2023-03-25 NOTE — Progress Notes (Signed)
Kidney function has improved since procedure. It is back to baseline. Blood counts are stable without concern for bleeding and no elevated white so no concern for infection. Continue current medication regimen without changes at this time.

## 2023-03-30 ENCOUNTER — Other Ambulatory Visit: Payer: Self-pay | Admitting: Cardiology

## 2023-04-08 ENCOUNTER — Telehealth (HOSPITAL_COMMUNITY): Payer: Self-pay

## 2023-04-08 ENCOUNTER — Other Ambulatory Visit (HOSPITAL_COMMUNITY): Payer: Self-pay

## 2023-04-08 NOTE — Telephone Encounter (Signed)
Patient Advocate Encounter  Received renewal notification from [employer] for [employer]. Review of chart shows that patient has active coverage. Test claim returns $4 copay for 90 days. Assistance not needed at this time.  Burnell Blanks, CPhT Rx Patient Advocate Phone: [phone]

## 2023-04-11 ENCOUNTER — Other Ambulatory Visit: Payer: Self-pay | Admitting: Family

## 2023-04-12 ENCOUNTER — Ambulatory Visit: Payer: Medicaid Other | Attending: Cardiology | Admitting: Cardiology

## 2023-04-12 ENCOUNTER — Other Ambulatory Visit (HOSPITAL_COMMUNITY): Payer: Self-pay

## 2023-04-12 ENCOUNTER — Other Ambulatory Visit: Payer: Self-pay

## 2023-04-12 ENCOUNTER — Other Ambulatory Visit: Payer: Self-pay | Admitting: Adult Health

## 2023-04-12 VITALS — BP 132/90 | HR 76 | Wt 242.0 lb

## 2023-04-12 DIAGNOSIS — I5022 Chronic systolic (congestive) heart failure: Secondary | ICD-10-CM | POA: Diagnosis not present

## 2023-04-12 DIAGNOSIS — E669 Obesity, unspecified: Secondary | ICD-10-CM | POA: Insufficient documentation

## 2023-04-12 DIAGNOSIS — N183 Chronic kidney disease, stage 3 unspecified: Secondary | ICD-10-CM | POA: Insufficient documentation

## 2023-04-12 DIAGNOSIS — E781 Pure hyperglyceridemia: Secondary | ICD-10-CM | POA: Insufficient documentation

## 2023-04-12 DIAGNOSIS — R42 Dizziness and giddiness: Secondary | ICD-10-CM | POA: Insufficient documentation

## 2023-04-12 DIAGNOSIS — K5090 Crohn's disease, unspecified, without complications: Secondary | ICD-10-CM | POA: Insufficient documentation

## 2023-04-12 DIAGNOSIS — Z7902 Long term (current) use of antithrombotics/antiplatelets: Secondary | ICD-10-CM | POA: Insufficient documentation

## 2023-04-12 DIAGNOSIS — I130 Hypertensive heart and chronic kidney disease with heart failure and stage 1 through stage 4 chronic kidney disease, or unspecified chronic kidney disease: Secondary | ICD-10-CM | POA: Insufficient documentation

## 2023-04-12 DIAGNOSIS — I5042 Chronic combined systolic (congestive) and diastolic (congestive) heart failure: Secondary | ICD-10-CM | POA: Diagnosis not present

## 2023-04-12 DIAGNOSIS — I2510 Atherosclerotic heart disease of native coronary artery without angina pectoris: Secondary | ICD-10-CM | POA: Diagnosis not present

## 2023-04-12 DIAGNOSIS — R0602 Shortness of breath: Secondary | ICD-10-CM | POA: Insufficient documentation

## 2023-04-12 DIAGNOSIS — I255 Ischemic cardiomyopathy: Secondary | ICD-10-CM | POA: Insufficient documentation

## 2023-04-12 DIAGNOSIS — Z7984 Long term (current) use of oral hypoglycemic drugs: Secondary | ICD-10-CM | POA: Insufficient documentation

## 2023-04-12 DIAGNOSIS — Z79899 Other long term (current) drug therapy: Secondary | ICD-10-CM | POA: Insufficient documentation

## 2023-04-12 DIAGNOSIS — Z87891 Personal history of nicotine dependence: Secondary | ICD-10-CM | POA: Insufficient documentation

## 2023-04-12 DIAGNOSIS — Z7982 Long term (current) use of aspirin: Secondary | ICD-10-CM | POA: Diagnosis not present

## 2023-04-12 MED ORDER — FAMOTIDINE 10 MG PO TABS
10.0000 mg | ORAL_TABLET | Freq: Every evening | ORAL | 0 refills | Status: DC | PRN
  Filled 2023-04-12: qty 50, 100d supply, fill #0
  Filled 2023-04-12 (×2): qty 90, 90d supply, fill #0

## 2023-04-12 MED ORDER — FUROSEMIDE 20 MG PO TABS
20.0000 mg | ORAL_TABLET | Freq: Every day | ORAL | 1 refills | Status: DC | PRN
  Filled 2023-04-12 (×2): qty 90, 90d supply, fill #0

## 2023-04-12 MED ORDER — CARVEDILOL 6.25 MG PO TABS: 6.2500 mg | ORAL_TABLET | Freq: Two times a day (BID) | ORAL | 3 refills | Status: DC

## 2023-04-12 MED ORDER — ONDANSETRON HCL 4 MG PO TABS
4.0000 mg | ORAL_TABLET | Freq: Three times a day (TID) | ORAL | 4 refills | Status: DC | PRN
  Filled 2023-04-12: qty 20, 7d supply, fill #0
  Filled 2023-05-17 (×2): qty 20, 7d supply, fill #1
  Filled 2023-05-22: qty 20, 7d supply, fill #2

## 2023-04-12 NOTE — Patient Instructions (Signed)
INCREASE Carvedilol to 6.25 mg Twice daily  We will be in contact with you about the Repeatha and Semaglutide.  Your physician recommends that you schedule a follow-up appointment in: 2 months ( JANUARY 2025) ** PLEASE CALL THE OFFICE IN MID DECEMBER TO ARRANGE YOUR FOLLOW UP APPOINTMENT. **

## 2023-04-12 NOTE — Progress Notes (Signed)
PCP: Caesar Bookman, NP Cardiology: Dr. Okey Dupre HF Cardiology: Dr Shirlee Latch  52 y.o. with history of CAD, chronic systolic CHF, Crohns disease, CKD stage 3, and HTN was referred by Dr. Okey Dupre to HF clinic for evaluation of CHF.  Patient was admitted in 3/23 with newly found cardiomyopathy, CHF and atypical PNA.  Echo in 3/23 showed EF 30-35%, moderate LVH, normal RV. LHC showed occluded RCA with collaterals, 60% mLAD, 65% mLCx, 90% mid OM1.  RHC showed elevated filling pressures and low CI (2).  Patient was admitted again in 4/23 with CHF. He was admitted in 5/23 with hypertensive urgency and chest pain, CTA abdomen/pelvis did not show renal artery stenosis. He was admitted again in 6/23 with hypertensive urgency.  He quit smoking after 3/23 admission.  Rarely drinks ETOH and no drugs.  No strong family history of CAD or cardiomyopathy.    Sleep study was done, not suggestive of OSA.   He was admitted in 4/24 with chest pain => no evidence for ACS.  He had severe watery diarrhea via his ostomy and was dehydrated. Meds were held. Echo in 4/24 was improved, showing EF 45-50%, inferior and inferolateral hypokinesis, normal RV.   Patient continued to have chest pain episodes so LHC/RHC was done in 5/24 showing occluded mRCA with collaterals, 90% stenosis with severe diffuse disease in OM1. 50% mLAD, 60% proximal and mid LCx (unchanged from prior, no interventional target); normal filling pressures and preserved cardiac output.   Patient went to the ER 11/06/22 with atypical chest pain, negative workup.  He was in the ER again 11/10/22 with lightheadedness.  BP stable in ER but he said that SBP was as low as 80s at home. BP-active meds were adjusted.   Patient had progressive angina with repeat cath in 10/24.  This showed similar severe diffuse disease in the OM1, he had DES to OM1.  Other disease unchanged, CTO of RCA, 50% mid LAD.   Patient returns for followup of CHF and CAD.  He has done better since PCI to  OM1.  Still getting episodes of atypical chest pain (not exertional), but frequency has dropped. He is tired/short of breath by the end of the day, gets short of breath walking 100 yards towards the end of the day (does better in the morning).  Under a lot of stress caring for his mother who is chronically ill. He uses Lasix every few days.  Weight is up 8 lbs. He asks about use of GLP-1 agonist.   ECG (personally reviewed): NSR, old inferior MI, LVH, lateral TWIs  Labs (5/23): LDL 43, TGs 382 Labs (6/23): K 4.2, creatinine 1.16 Labs (7/23): K 4.2, creatinine 1.42, LDL 70, TGs 214 Labs (11/23): K 4.1, creatinine 1.27 Labs (2/24): LDL 81 Labs (5/24): K 4.9, creatinine 1.72 => 1.56, BNP 78, LDL 59, TGs 165 Labs (6/24): K 3.9, creatinine 1.83, hgb 14.2 Labs (8/24): K 4.4, creatinine 1.83 Labs (10/24): LDL 82, TGs 173 Labs (11/24): K 4.7, creatinine 1.64  PMH: 1. Crohns disease: s/p colectomy with ostomy.  2. CKD stage 3: History of hyperkalemia.  3. HTN: CT abdomen/pelvis with no evidence for renal artery stenosis.  4. Pulmonary nodules: Stable on last CT.  5. CAD: LHC 3/23 with CTO mid RCA with collaterals, 60% mLAD, 65% mLCx, 90% mid OM1.  - LHC (5/24): occluded mRCA with collaterals, 90% stenosis with severe diffuse disease in OM1. 50% mLAD, 60% proximal and mid LCx (unchanged from prior, no interventional target) - LHC (  10/24): Occluded mRCA with collaterals, 90% long/diffuse stenosis in OM1, 50% mLAD.  He was treated with DES to OM1.  6. Chronic systolic CHF: Ischemic cardiomyopathy (cannot fully rule out nonischemic component).  - Echo (3/23): EF 30-35%, moderate LVH, normal RV. - RHC (3/23): mean RA 16, PA 67/30 mean 42, mean PCWP 25, CI 2, PVR 4 - Echo (5/23): EF 35-40%, global HK, moderate LVH, RV normal, mild MR - Echo (4/24): EF 45-50%, inferior and inferolateral hypokinesis, normal RV.  - RHC (5/24): mean RA 4, PA 36/9, mean PCWP 10, CI 2.36, PVR 1.7 WU 7. Sleep study negative  for OSA.  8. Peripheral arterial dopplers (8/24): Normal.   SH: Single, quit smoking in 3/23, rare ETOH/no drugs, lives in [place] and works for a [employer].   FH: Uncles with "heart trouble"  ROS: All systems reviewed and negative except as per HPI.   Current Outpatient Medications  Medication Sig Dispense Refill   acetaminophen (TYLENOL) 325 MG tablet Take 650 mg by mouth at bedtime.     aspirin EC 81 MG tablet Take 1 tablet (81 mg total) by mouth daily. Swallow whole. 30 tablet 4   atorvastatin (LIPITOR) 80 MG tablet Take 1 tablet (80 mg total) by mouth every evening. 90 tablet 3   clopidogrel (PLAVIX) 75 MG tablet TAKE 1 TABLET BY MOUTH EVERY DAY 30 tablet 3   empagliflozin (JARDIANCE) 10 MG TABS tablet Take 1 tablet (10 mg total) by mouth daily. 90 tablet 0   ezetimibe (ZETIA) 10 MG tablet Take 1 tablet (10 mg total) by mouth daily. 90 tablet 3   famotidine (PEPCID) 10 MG tablet Take 1 tablet (10 mg total) by mouth at bedtime as needed. 90 tablet 0   fenofibrate (TRICOR) 48 MG tablet Take 1 tablet (48 mg total) by mouth daily. 30 tablet 8   furosemide (LASIX) 20 MG tablet Take 1 tablet (20 mg total) by mouth daily as needed. For weight gain of 3 lb in 24 hours or 5 lbs in one week 90 tablet 1   isosorbide mononitrate (IMDUR) 60 MG 24 hr tablet Take 1.5 tablets (90 mg total) by mouth daily. 135 tablet 3   nitroGLYCERIN (NITROSTAT) 0.4 MG SL tablet Place 1 tablet (0.4 mg total) under the tongue every 5 (five) minutes as needed for chest pain. 25 tablet 3   ondansetron (ZOFRAN) 4 MG tablet Take 1 tablet (4 mg total) by mouth every 8 (eight) hours as needed for nausea or vomiting. 20 tablet 4   pantoprazole (PROTONIX) 40 MG tablet Take 1 tablet (40 mg total) by mouth daily. 30 tablet 11   ranolazine (RANEXA) 500 MG 12 hr tablet TAKE 1 TABLET BY MOUTH TWICE A DAY (Patient taking differently: Take 250 mg by mouth 4 (four) times daily.) 60 tablet 0   sacubitril-valsartan (ENTRESTO)  24-26 MG Take 1 tablet by mouth 2 (two) times daily. 180 tablet 1   spironolactone (ALDACTONE) 25 MG tablet Take 1 tablet (25 mg total) by mouth daily. 90 tablet 1   carvedilol (COREG) 6.25 MG tablet Take 1 tablet (6.25 mg total) by mouth 2 (two) times daily with a meal. 90 tablet 3   gabapentin (NEURONTIN) 100 MG capsule Take 1 capsule (100 mg total) by mouth 3 (three) times daily as needed. 10 capsule 0   No current facility-administered medications for this visit.   Wt Readings from Last 3 Encounters:  04/12/23 242 lb (109.8 kg)  03/23/23 238 lb 6.4 oz (108.1  kg)  03/09/23 237 lb 9.6 oz (107.8 kg)   BP (!) 132/90   Pulse 76   Wt 242 lb (109.8 kg)   SpO2 99%   BMI 34.72 kg/m  General: NAD Neck: No JVD, no thyromegaly or thyroid nodule.  Lungs: Clear to auscultation bilaterally with normal respiratory effort. CV: Nondisplaced PMI.  Heart regular S1/S2, no S3/S4, no murmur.  No peripheral edema.  No carotid bruit.  Normal pedal pulses.  Abdomen: Soft, nontender, no hepatosplenomegaly, no distention.  Skin: Intact without lesions or rashes.  Neurologic: Alert and oriented x 3.  Psych: Normal affect. Extremities: No clubbing or cyanosis.  HEENT: Normal.   Assessment/Plan: 1. Chronic systolic CHF: Suspect primarily ischemic cardiomyopathy but cannot rule out component of nonischemic cardiomyopathy. RHC in 3/23 with elevated filling pressures and low CI, 2. Echo in 5/23 with EF 35-40%, normal RV.  Echo in 4/24 with EF up to 45-50%, inferior/inferolateral hypokinesis. NYHA class II-III, not volume overloaded on exam though weight is up.  GDMT has been limited by lightheadedness and CKD.  - Continue Entresto 24/26 bid.    - Increase Coreg to 6.25 mg bid.  - Continue Jardiance 10 mg daily.  - Continue spironolactone 25 mg daily. BMET/BNP today.   - EF is out of ICD range.  2. CAD: Cath in 3/23 with CTO mid RCA with collaterals, 60% mLAD, 65% mLCx, 90% mid OM1.  Cath in 5/24 was very  similar with occluded mRCA with collaterals, 90% stenosis with severe diffuse disease in OM1. 50% mLAD, 60% proximal and mid LCx.  He continued to have frequent atypical chest pain, with progressive symptoms in 10/24, he was taken back for cath.  Images were very similar to 5/24 => this time he had DES to OM1.  He still has atypical chest pain, but the frequency has significantly decreased compared to the past.  - Continue Imdur 90 mg daily.  - Increase Coreg as above.  - Continue ranolazone. QTc ok on ECG.  - Continue ASA 81 and Plavix 75 daily.  - Continue atorvastatin and Zetia.  Goal LDL < 55, I will refer him to lipids clinic to see if I can get him Repatha.   - Continue fenofibrate with elevated triglycerides.  - He does not think he has time to do cardiac rehab as he is caring for his mother most of the day.  3. CKD: Stage 3.  Follow closely, BMET today.   4. Obesity: Patient is interested in GLP-1 agonist.  I think this would be very reasonable given his CAD and obesity.  - I will refer to pharmacy clinic for semaglutide.   Followup in 2 months.   Marca Ancona 04/12/2023

## 2023-04-13 ENCOUNTER — Other Ambulatory Visit (HOSPITAL_COMMUNITY): Payer: Self-pay

## 2023-04-22 ENCOUNTER — Telehealth: Payer: Self-pay | Admitting: Family

## 2023-04-22 ENCOUNTER — Other Ambulatory Visit: Payer: Self-pay

## 2023-04-22 ENCOUNTER — Ambulatory Visit: Payer: Medicaid Other | Attending: Internal Medicine | Admitting: Pharmacist

## 2023-04-22 VITALS — BP 102/70 | HR 65 | Wt 241.6 lb

## 2023-04-22 DIAGNOSIS — I25118 Atherosclerotic heart disease of native coronary artery with other forms of angina pectoris: Secondary | ICD-10-CM

## 2023-04-22 MED ORDER — WEGOVY 0.25 MG/0.5ML ~~LOC~~ SOAJ: 0.2500 mg | SUBCUTANEOUS | 0 refills | Status: DC

## 2023-04-22 MED ORDER — PRALUENT 150 MG/ML ~~LOC~~ SOAJ: 150.0000 mg | SUBCUTANEOUS | 2 refills | Status: DC

## 2023-04-22 NOTE — Progress Notes (Signed)
Advanced [facility] Failure Clinic Note  PCP: Ngetich, Donalee Citrin, NP PCP-Cardiologist: Yvonne Kendall, MD HF-Cardiologist: Marca Ancona, MD  HPI:  52 y.o. with history of CAD, chronic systolic CHF, Crohns disease, CKD stage 3, and HTN was referred by Dr. Okey Dupre to HF clinic for evaluation of CHF.  Patient was admitted in 3/23 with newly found cardiomyopathy, CHF and atypical PNA.  Echo in 3/23 showed EF 30-35%, moderate LVH, normal RV. LHC showed occluded RCA with collaterals, 60% mLAD, 65% mLCx, 90% mid OM1.  RHC showed elevated filling pressures and low CI (2).  Patient was admitted again in 4/23 with CHF. He was admitted in 5/23 with hypertensive urgency and chest pain, CTA abdomen/pelvis did not show renal artery stenosis. He was admitted again in 6/23 with hypertensive urgency.  He quit smoking after 3/23 admission.  Rarely drinks ETOH and no drugs.  No strong family history of CAD or cardiomyopathy.     Sleep study was done, not suggestive of OSA.    He was admitted in 4/24 with chest pain => no evidence for ACS.  He had severe watery diarrhea via his ostomy and was dehydrated. Meds were held. Echo in 4/24 was improved, showing EF 45-50%, inferior and inferolateral hypokinesis, normal RV.    Patient continued to have chest pain episodes so LHC/RHC was done in 5/24 showing occluded mRCA with collaterals, 90% stenosis with severe diffuse disease in OM1. 50% mLAD, 60% proximal and mid LCx (unchanged from prior, no interventional target); normal filling pressures and preserved cardiac output.    Patient went to the ER 11/06/22 with atypical chest pain, negative workup.  He was in the ER again 11/10/22 with lightheadedness.  BP stable in ER but he said that SBP was as low as 80s at home. BP-active meds were adjusted.    Patient had progressive angina with repeat cath in 10/24.  This showed similar severe diffuse disease in the OM1, he had DES to OM1.  Other disease unchanged, CTO of RCA, 50% mid LAD.     Patient saw Dr. Shirlee Latch on11/25/24.Was still getting episodes of atypical chest pain (not exertional), but frequency has dropped. Carvedilol was increased to 6.25 mg twice daily.  Today DAQUANTE CALLIS returns to [facility] Failure Clinic for pharmacist medication titration. Reports feeling average over the last couple of weeks. Reports dizziness and lightheadedness ~ 2 times per week, occasional fatigue, and occasional chest pain that is at baseline. Also experiences SOB with moderate exertion and mild LEE. Denies palpitations orthopnea and PND. Reports being able to complete all activities of daily living (ADLs). Is somewhat active throughout the day. Weight at home is not checked . Takes furosemide 20 mg every other day. Appetite is good. Does follow a low sodium diet. Exercise is limited with his long-standing chest pain, but he has maintained a heart healthy diet well, but struggles to lose weight.  Current Heart Failure Medications: Loop diuretic: furosemide 20 mg every other day Beta-Blocker: carvedilol 6.25 mg twice daily ACEI/ARB/ARNI: Entresto 24-26 mg twice daily MRA: spironolactone 25 mg daily SGLT2i: Jardiance 10 mg daily   Has the patient been experiencing any side effects to the medications prescribed? No  Does the patient have any problems obtaining medications due to transportation or finances? No  Understanding of regimen: Good  Understanding of indications: Good  Potential of adherence: Poor. Patient reports occasionally missing doses of medications. Reports cutting his extended release Ranexa in half and only uses prn rather than daily. Advised against this.  Patient understands to avoid NSAIDs.  Patient understands to avoid decongestants.  Pertinent Lab Values: Creat  Date Value Ref Range Status  03/09/2023 2.11 (H) 0.70 - 1.30 mg/dL Final   Creatinine, Ser  Date Value Ref Range Status  03/23/2023 1.64 (H) 0.76 - 1.27 mg/dL Final   BUN  Date Value Ref Range  Status  03/23/2023 25 (H) 6 - 24 mg/dL Final   Potassium  Date Value Ref Range Status  03/23/2023 4.7 3.5 - 5.2 mmol/L Final   Sodium  Date Value Ref Range Status  03/23/2023 144 134 - 144 mmol/L Final   Brain Natriuretic Peptide  Date Value Ref Range Status  10/02/2022 78 <100 pg/mL Final    Comment:    . BNP levels increase with age in the general population with the highest values seen in individuals greater than 80 years of age. Reference: J. Am. Ladon Applebaum. Cardiol. 2002; 44:010-272. .    B Natriuretic Peptide  Date Value Ref Range Status  02/23/2023 64.4 0.0 - 100.0 pg/mL Final    Comment:    Performed at [facility], [address] [place], [place] [address]   Magnesium  Date Value Ref Range Status  09/10/2022 2.2 1.7 - 2.4 mg/dL Final    Comment:    Performed at [facility], [address] [place] [place], [place] [address]   TSH  Date Value Ref Range Status  03/09/2023 0.84 0.40 - 4.50 mIU/L Final    Vital Signs: Today's Vitals   04/22/23 1459  BP: 102/70  Pulse: 65  SpO2: 96%  Weight: 241 lb 9.6 oz (109.6 kg)    Assessment/Plan: 1. Chronic systolic CHF: Suspect primarily ischemic cardiomyopathy but cannot rule out component of nonischemic cardiomyopathy. RHC in 3/23 with elevated filling pressures and low CI, 2. Echo in 5/23 with EF 35-40%, normal RV.  Echo in 4/24 with EF up to 45-50%, inferior/inferolateral hypokinesis. NYHA class II-III, only mildly volume overloaded on exam.  GDMT has been limited by lightheadedness and CKD.  - Continue Entresto 24/26 bid.  - Continue Coreg to 6.25 mg bid. Is tolerating increased dose fine. HR in low 60s today. - Continue Jardiance 10 mg daily.  - Continue spironolactone 25 mg daily. BMET last visit showed creatinine improved to 1.64 and K stable at 4.7. - EF is out of ICD range.  2. CAD: Cath in 3/23 with CTO mid RCA with collaterals, 60% mLAD, 65% mLCx, 90% mid OM1.  Cath in 5/24 was very similar with  occluded mRCA with collaterals, 90% stenosis with severe diffuse disease in OM1. 50% mLAD, 60% proximal and mid LCx.  He continued to have frequent atypical chest pain, with progressive symptoms in 10/24, he was taken back for cath.  Images were very similar to 5/24 => this time he had DES to OM1.  He still has atypical chest pain, but the frequency has significantly decreased compared to the past.  - Continue Imdur 90 mg daily.  - Continue Coreg as above.  - Educated to take ranolazine as prescribed as there is increased risk of ADEs when cutting tablets. Was also instructed to take it scheduled rather than as needed. If he can not tolerate the whole tablet, it may be better to discontinue it altogether to prevent harm.  - Continue ASA 81 and Plavix 75 daily.  - Continue atorvastatin and Zetia.  Goal LDL < 55, PA for Repatha was denied, but Praluent was approved. Start Praluent 150 mg SQ every 2 weeks.  Lipid panel in 2-3 months. - Continue fenofibrate with elevated triglycerides.   3. CKD: Stage 3.  Improving creatinine. Continue Jardiance.   4. Obesity: PA for Reginal Lutes is approved. Start 0.25 mg SQ weekly. Will call in 3 weeks to assess tolerability. Also has Crohn's disease. Educated on ADEs being similar and what to watch for. Denies personal or family history of medullary thyroid cancer.    Follow up: in January with Dr. Shirlee Latch  Please do not hesitate to reach out with questions or concerns,  Enos Fling, PharmD, CPP, BCPS Heart Failure Pharmacist  Phone - [phone] 04/22/2023 4:40 PM

## 2023-04-22 NOTE — Patient Instructions (Signed)
It was a pleasure seeing you today!  MEDICATIONS: -We are changing your medications today -Start Praluent 150 mg injections under the skin in your stomach once every 2 weeks (14 days) -Start Ozempic 0.25 mg underthe skin in your stomach once every week (7 days) -Call if you have questions about your medications.  LABS: -We will call you if your labs need attention.  NEXT APPOINTMENT: Return to clinic in January with Dr. Shirlee Latch.  In general, to take care of your heart failure: -Limit your fluid intake to 2 Liters (half-gallon) per day.   -Limit your salt intake to ideally 2-3 grams (2000-3000 mg) per day. -Weigh yourself daily and record, and bring that "weight diary" to your next appointment.  (Weight gain of 2-3 pounds in 1 day typically means fluid weight.) -The medications for your heart are to help your heart and help you live longer.   -Please contact us before stopping any of your heart medications.  Call the clinic at [phone] with questions or to reschedule future appointments.

## 2023-04-30 ENCOUNTER — Other Ambulatory Visit: Payer: Self-pay | Admitting: Cardiology

## 2023-04-30 MED ORDER — SPIRONOLACTONE 25 MG PO TABS: 25.0000 mg | ORAL_TABLET | Freq: Every day | ORAL | 2 refills | Status: DC

## 2023-04-30 MED ORDER — CLOPIDOGREL BISULFATE 75 MG PO TABS: 75.0000 mg | ORAL_TABLET | Freq: Every day | ORAL | 11 refills | Status: DC

## 2023-04-30 NOTE — Telephone Encounter (Signed)
Pt came into clinic requesting refills on Spironolactone and Plavix. Refills sent in.

## 2023-05-04 ENCOUNTER — Telehealth: Payer: Self-pay | Admitting: Pharmacist

## 2023-05-04 MED ORDER — SEMAGLUTIDE(0.25 OR 0.5MG/DOS) 2 MG/3ML ~~LOC~~ SOPN: 0.5000 mg | PEN_INJECTOR | SUBCUTANEOUS | 0 refills | Status: DC

## 2023-05-04 NOTE — Telephone Encounter (Signed)
Refill sent for 0.5 mg dose of Wegovy. Patient is tolerating well with no issues.

## 2023-05-12 ENCOUNTER — Encounter: Payer: Self-pay | Admitting: Cardiology

## 2023-05-14 ENCOUNTER — Encounter: Payer: Self-pay | Admitting: Family

## 2023-05-17 ENCOUNTER — Other Ambulatory Visit (HOSPITAL_COMMUNITY): Payer: Self-pay

## 2023-05-20 ENCOUNTER — Telehealth: Payer: Self-pay | Admitting: Pharmacist

## 2023-05-20 NOTE — Telephone Encounter (Signed)
 Attempted to contact patient to evaluate GLP-1 RA therapy for appropriateness to increase dose or continue current therapy, however there was no response. HIPAA compliant VM left with a callback number.

## 2023-05-21 ENCOUNTER — Other Ambulatory Visit: Payer: Self-pay | Admitting: Cardiology

## 2023-05-21 MED ORDER — WEGOVY 1 MG/0.5ML ~~LOC~~ SOAJ: 1.0000 mg | SUBCUTANEOUS | 0 refills | Status: DC

## 2023-05-21 MED ORDER — PRALUENT 150 MG/ML ~~LOC~~ SOAJ: 150.0000 mg | SUBCUTANEOUS | 2 refills | Status: DC

## 2023-05-21 NOTE — Addendum Note (Signed)
 Addended by: Marygrace Drought on: 05/21/2023 01:06 PM   Modules accepted: Orders

## 2023-05-21 NOTE — Telephone Encounter (Signed)
 Patient reports doing well on the 0.5 mg dose without issue. New prescription sent for 1 mg dose.

## 2023-05-24 ENCOUNTER — Other Ambulatory Visit: Payer: Self-pay | Admitting: Cardiology

## 2023-05-24 ENCOUNTER — Other Ambulatory Visit (HOSPITAL_COMMUNITY): Payer: Self-pay

## 2023-05-25 ENCOUNTER — Other Ambulatory Visit (HOSPITAL_COMMUNITY): Payer: Self-pay

## 2023-05-25 MED ORDER — ENTRESTO 24-26 MG PO TABS
1.0000 | ORAL_TABLET | Freq: Two times a day (BID) | ORAL | 1 refills | Status: DC
  Filled 2023-05-25: qty 180, 90d supply, fill #0

## 2023-05-26 ENCOUNTER — Other Ambulatory Visit
Admission: RE | Admit: 2023-05-26 | Discharge: 2023-05-26 | Disposition: A | Discharge status: Home / Self Care | Payer: Medicaid Other | Source: Ambulatory Visit | Attending: Cardiology | Admitting: Cardiology

## 2023-05-26 ENCOUNTER — Other Ambulatory Visit (HOSPITAL_COMMUNITY): Payer: Self-pay

## 2023-05-26 ENCOUNTER — Ambulatory Visit (HOSPITAL_BASED_OUTPATIENT_CLINIC_OR_DEPARTMENT_OTHER): Payer: Medicaid Other | Admitting: Cardiology

## 2023-05-26 DIAGNOSIS — I5042 Chronic combined systolic (congestive) and diastolic (congestive) heart failure: Secondary | ICD-10-CM | POA: Diagnosis not present

## 2023-05-26 LAB — LIPID PANEL
Cholesterol: 71 mg/dL (ref 0–200)
HDL: 32 mg/dL — ABNORMAL LOW (ref >40)
LDL Cholesterol: 15 mg/dL (ref 0–99)
Total CHOL/HDL Ratio: 2.2 {ratio}
Triglycerides: 120 mg/dL (ref <150)
VLDL: 24 mg/dL (ref 0–40)

## 2023-05-26 LAB — BASIC METABOLIC PANEL
Anion gap: 11 (ref 5–15)
BUN: 28 mg/dL — ABNORMAL HIGH (ref 6–20)
CO2: 25 mmol/L (ref 22–32)
Calcium: 9.7 mg/dL (ref 8.9–10.3)
Chloride: 108 mmol/L (ref 98–111)
Creatinine, Ser: 1.85 mg/dL — ABNORMAL HIGH (ref 0.61–1.24)
GFR, Estimated: 43 mL/min — ABNORMAL LOW (ref >60)
Glucose, Bld: 142 mg/dL — ABNORMAL HIGH (ref 70–99)
Potassium: 4.5 mmol/L (ref 3.5–5.1)
Sodium: 144 mmol/L (ref 135–145)

## 2023-05-26 MED ORDER — CARVEDILOL 6.25 MG PO TABS: 12.5000 mg | ORAL_TABLET | Freq: Two times a day (BID) | ORAL | Status: DC

## 2023-05-26 MED ORDER — CARVEDILOL 12.5 MG PO TABS: 18.7500 mg | ORAL_TABLET | Freq: Two times a day (BID) | ORAL | 3 refills | Status: DC

## 2023-05-26 MED ORDER — ENTRESTO 24-26 MG PO TABS: 1.0000 | ORAL_TABLET | Freq: Two times a day (BID) | ORAL | 3 refills | Status: DC

## 2023-05-26 NOTE — Patient Instructions (Signed)
 INCREASE Carvedilol  to 18.75 mg ( 1 1/2 tab) Twice daily  Go over to the [facility]. Go pass the gift shop and have your blood work completed.  We will only call you if the results are abnormal or if the provider would like to make medication changes.  Your physician has requested that you have an echocardiogram. Echocardiography is a painless test that uses sound waves to create images of your heart. It provides your doctor with information about the size and shape of your heart and how well your heart's chambers and valves are working. This procedure takes approximately one hour. There are no restrictions for this procedure. Please do NOT wear cologne, perfume, aftershave, or lotions (deodorant is allowed). Please arrive 15 minutes prior to your appointment time.  Please note: We ask at that you not bring children with you during ultrasound (echo/ vascular) testing. Due to room size and safety concerns, children are not allowed in the ultrasound rooms during exams. Our front office staff cannot provide observation of children in our lobby area while testing is being conducted. An adult accompanying a patient to their appointment will only be allowed in the ultrasound room at the discretion of the ultrasound technician under special circumstances. We apologize for any inconvenience.  Your physician recommends that you schedule a follow-up appointment in: 3 months with an echocardiogram ( April ) ** PLEASE CALL THE OFFICE IN FEBRUARY TO ARRANGE YOUR FOLLOW UP APPOINTMENT. **  At the [facility], you and your health needs are our priority. As part of our continuing mission to provide you with exceptional heart care, we have created designated Provider Care Teams. These Care Teams include your primary Cardiologist (physician) and Advanced Practice Providers (APPs- Physician Assistants and Nurse Practitioners) who all work together to provide you with the care you need, when you need  it.   You may see any of the following providers on your designated Care Team at your next follow up: Dr Toribio Fuel Dr Ezra Shuck Dr. Ria Commander Dr. Morene Brownie Amy Lenetta, NP Caffie Shed, [place] [facility] [place], [place] Beckey Coe, NP Jordan Lee, NP Jaun Bash, PharmD   Please be sure to bring in all your medications bottles to every appointment.    Thank you for choosing [facility]

## 2023-05-26 NOTE — Progress Notes (Signed)
 PCP: Leonarda Roxan BROCKS, NP Cardiology: Dr. Mady HF Cardiology: Dr Rolan  53 y.o. with history of CAD, chronic systolic CHF, Crohns disease, CKD stage 3, and HTN was referred by Dr. Mady to HF clinic for evaluation of CHF.  Patient was admitted in 3/23 with newly found cardiomyopathy, CHF and atypical PNA.  Echo in 3/23 showed EF 30-35%, moderate LVH, normal RV. LHC showed occluded RCA with collaterals, 60% mLAD, 65% mLCx, 90% mid OM1.  RHC showed elevated filling pressures and low CI (2).  Patient was admitted again in 4/23 with CHF. He was admitted in 5/23 with hypertensive urgency and chest pain, CTA abdomen/pelvis did not show renal artery stenosis. He was admitted again in 6/23 with hypertensive urgency.  He quit smoking after 3/23 admission.  Rarely drinks ETOH and no drugs.  No strong family history of CAD or cardiomyopathy.    Sleep study was done, not suggestive of OSA.   He was admitted in 4/24 with chest pain => no evidence for ACS.  He had severe watery diarrhea via his ostomy and was dehydrated. Meds were held. Echo in 4/24 was improved, showing EF 45-50%, inferior and inferolateral hypokinesis, normal RV.   Patient continued to have chest pain episodes so LHC/RHC was done in 5/24 showing occluded mRCA with collaterals, 90% stenosis with severe diffuse disease in OM1. 50% mLAD, 60% proximal and mid LCx (unchanged from prior, no interventional target); normal filling pressures and preserved cardiac output.   Patient went to the ER 11/06/22 with atypical chest pain, negative workup.  He was in the ER again 11/10/22 with lightheadedness.  BP stable in ER but he said that SBP was as low as 80s at home. BP-active meds were adjusted.   Patient had progressive angina with repeat cath in 10/24.  This showed similar severe diffuse disease in the OM1, he had DES to OM1.  Other disease unchanged, CTO of RCA, 50% mid LAD.   Patient returns for followup of CHF and CAD.  Weight down 2 lbs.  He has  occasional episodes of atypical chest pain, improved since PCI in 10/24.  No dyspnea with usual activities.  Mild dyspnea walking up a hill. He is taking care of his mother who has multiple medical problems.   Labs (5/23): LDL 43, TGs 382 Labs (6/23): K 4.2, creatinine 1.16 Labs (7/23): K 4.2, creatinine 1.42, LDL 70, TGs 214 Labs (11/23): K 4.1, creatinine 1.27 Labs (2/24): LDL 81 Labs (5/24): K 4.9, creatinine 1.72 => 1.56, BNP 78, LDL 59, TGs 165 Labs (6/24): K 3.9, creatinine 1.83, hgb 14.2 Labs (8/24): K 4.4, creatinine 1.83 Labs (10/24): LDL 82, TGs 173 Labs (11/24): K 4.7, creatinine 1.64  PMH: 1. Crohns disease: s/p colectomy with ostomy.  2. CKD stage 3: History of hyperkalemia.  3. HTN: CT abdomen/pelvis with no evidence for renal artery stenosis.  4. Pulmonary nodules: Stable on last CT.  5. CAD: LHC 3/23 with CTO mid RCA with collaterals, 60% mLAD, 65% mLCx, 90% mid OM1.  - LHC (5/24): occluded mRCA with collaterals, 90% stenosis with severe diffuse disease in OM1. 50% mLAD, 60% proximal and mid LCx (unchanged from prior, no interventional target) - LHC (10/24): Occluded mRCA with collaterals, 90% long/diffuse stenosis in OM1, 50% mLAD.  He was treated with DES to OM1.  6. Chronic systolic CHF: Ischemic cardiomyopathy (cannot fully rule out nonischemic component).  - Echo (3/23): EF 30-35%, moderate LVH, normal RV. - RHC (3/23): mean RA 16, PA 67/30 mean  42, mean PCWP 25, CI 2, PVR 4 - Echo (5/23): EF 35-40%, global HK, moderate LVH, RV normal, mild MR - Echo (4/24): EF 45-50%, inferior and inferolateral hypokinesis, normal RV.  - RHC (5/24): mean RA 4, PA 36/9, mean PCWP 10, CI 2.36, PVR 1.7 WU 7. Sleep study negative for OSA.  8. Peripheral arterial dopplers (8/24): Normal.   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   FH: Uncles with heart trouble  ROS: All systems reviewed and negative except as per  HPI.   Current Outpatient Medications  Medication Sig Dispense Refill   acetaminophen  (TYLENOL ) 325 MG tablet Take 650 mg by mouth at bedtime.     Alirocumab  (PRALUENT ) 150 MG/ML SOAJ Inject 1 mL (150 mg total) into the skin every 14 (fourteen) days. 2 mL 2   aspirin  EC 81 MG tablet Take 1 tablet (81 mg total) by mouth daily. Swallow whole. 30 tablet 4   atorvastatin  (LIPITOR ) 80 MG tablet Take 1 tablet (80 mg total) by mouth every evening. 90 tablet 3   clopidogrel  (PLAVIX ) 75 MG tablet Take 1 tablet (75 mg total) by mouth daily. 30 tablet 11   empagliflozin  (JARDIANCE ) 10 MG TABS tablet Take 1 tablet (10 mg total) by mouth daily. 90 tablet 0   ezetimibe  (ZETIA ) 10 MG tablet Take 1 tablet (10 mg total) by mouth daily. 90 tablet 3   famotidine  (PEPCID ) 10 MG tablet Take 1 tablet (10 mg total) by mouth at bedtime as needed. 90 tablet 0   fenofibrate  (TRICOR ) 48 MG tablet Take 1 tablet (48 mg total) by mouth daily. 30 tablet 8   furosemide  (LASIX ) 20 MG tablet Take 1 tablet (20 mg total) by mouth daily as needed. For weight gain of 3 lb in 24 hours or 5 lbs in one week 90 tablet 1   isosorbide  mononitrate (IMDUR ) 60 MG 24 hr tablet Take 1.5 tablets (90 mg total) by mouth daily. 135 tablet 3   nitroGLYCERIN  (NITROSTAT ) 0.4 MG SL tablet Place 1 tablet (0.4 mg total) under the tongue every 5 (five) minutes as needed for chest pain. 25 tablet 3   ondansetron  (ZOFRAN ) 4 MG tablet Take 1 tablet (4 mg total) by mouth every 8 (eight) hours as needed for nausea or vomiting. 20 tablet 4   pantoprazole  (PROTONIX ) 40 MG tablet Take 1 tablet (40 mg total) by mouth daily. 30 tablet 11   ranolazine  (RANEXA ) 500 MG 12 hr tablet TAKE 1 TABLET BY MOUTH TWICE A DAY (Patient taking differently: Take 250 mg by mouth 4 (four) times daily as needed (Chest pain).) 60 tablet 0   sacubitril -valsartan  (ENTRESTO ) 24-26 MG Take 1 tablet by mouth 2 (two) times daily. 180 tablet 1   spironolactone  (ALDACTONE ) 25 MG tablet Take 1  tablet (25 mg total) by mouth daily. 90 tablet 2   carvedilol  (COREG ) 12.5 MG tablet Take 1.5 tablets (18.75 mg total) by mouth 2 (two) times daily with a meal. 200 tablet 3   sacubitril -valsartan  (ENTRESTO ) 24-26 MG Take 1 tablet by mouth 2 (two) times daily. 180 tablet 3   Semaglutide -Weight Management (WEGOVY ) 1 MG/0.5ML SOAJ Inject 1 mg into the skin once a week. 2 mL 0   No current facility-administered medications for this visit.   Wt Readings from Last 3 Encounters:  05/26/23 240 lb (108.9 kg)  04/22/23 241 lb 9.6 oz (109.6 kg)  04/12/23 242 lb (109.8 kg)  BP 120/87   Pulse 81   Wt 240 lb (108.9 kg)   SpO2 97%   BMI 34.44 kg/m  General: NAD Neck: No JVD, no thyromegaly or thyroid  nodule.  Lungs: Clear to auscultation bilaterally with normal respiratory effort. CV: Nondisplaced PMI.  Heart regular S1/S2, no S3/S4, no murmur.  No peripheral edema.  No carotid bruit.  Normal pedal pulses.  Abdomen: Soft, nontender, no hepatosplenomegaly, no distention.  Skin: Intact without lesions or rashes.  Neurologic: Alert and oriented x 3.  Psych: Normal affect. Extremities: No clubbing or cyanosis.  HEENT: Normal.   Assessment/Plan: 1. Chronic systolic CHF: Suspect primarily ischemic cardiomyopathy but cannot rule out component of nonischemic cardiomyopathy. RHC in 3/23 with elevated filling pressures and low CI, 2. Echo in 5/23 with EF 35-40%, normal RV.  Echo in 4/24 with EF up to 45-50%, inferior/inferolateral hypokinesis. NYHA class II, not volume overloaded on exam with weight down.  GDMT has been limited by lightheadedness and CKD.  - Continue Entresto  24/26 bid.    - Increase Coreg  to 18.75 mg bid.  - Continue Jardiance  10 mg daily.  - Continue spironolactone  25 mg daily. BMET/BNP today.   - EF is out of ICD range.  - Repeat echo at followup in 3 months.  2. CAD: Cath in 3/23 with CTO mid RCA with collaterals, 60% mLAD, 65% mLCx, 90% mid OM1.  Cath in 5/24 was very similar with  occluded mRCA with collaterals, 90% stenosis with severe diffuse disease in OM1. 50% mLAD, 60% proximal and mid LCx.  He continued to have frequent atypical chest pain, with progressive symptoms in 10/24, he was taken back for cath.  Images were very similar to 5/24 => this time he had DES to OM1.  He still has atypical chest pain, but the frequency has significantly decreased compared to the past.  - Continue Imdur  90 mg daily.  - Increase Coreg  as above.  - Continue ranolazone. - Continue ASA 81 and Plavix  75 daily.  - Continue atorvastatin , Praluent  and Zetia .  Check lipids today.   - Continue fenofibrate  with elevated triglycerides.  - He does not think he has time to do cardiac rehab as he is caring for his mother most of the day.  3. CKD: Stage 3.  Follow closely, BMET today.   4. Obesity: Continue semaglutide .   Followup in 3 months with echo.   Ezra Shuck 05/26/2023

## 2023-05-27 NOTE — Telephone Encounter (Signed)
 Send new dose earlier

## 2023-06-03 ENCOUNTER — Other Ambulatory Visit: Payer: Self-pay

## 2023-06-03 ENCOUNTER — Emergency Department: Payer: Medicaid Other

## 2023-06-03 ENCOUNTER — Inpatient Hospital Stay
Admission: EM | Admit: 2023-06-03 | Discharge: 2023-06-05 | DRG: 303 | Disposition: A | Discharge status: Home / Self Care | Payer: Medicaid Other | Attending: Internal Medicine | Admitting: Internal Medicine

## 2023-06-03 DIAGNOSIS — Z9049 Acquired absence of other specified parts of digestive tract: Secondary | ICD-10-CM | POA: Diagnosis not present

## 2023-06-03 DIAGNOSIS — I10 Essential (primary) hypertension: Secondary | ICD-10-CM | POA: Diagnosis present

## 2023-06-03 DIAGNOSIS — Z800 Family history of malignant neoplasm of digestive organs: Secondary | ICD-10-CM | POA: Diagnosis not present

## 2023-06-03 DIAGNOSIS — Z79899 Other long term (current) drug therapy: Secondary | ICD-10-CM | POA: Diagnosis not present

## 2023-06-03 DIAGNOSIS — Z7985 Long-term (current) use of injectable non-insulin antidiabetic drugs: Secondary | ICD-10-CM | POA: Diagnosis not present

## 2023-06-03 DIAGNOSIS — K5090 Crohn's disease, unspecified, without complications: Secondary | ICD-10-CM | POA: Diagnosis present

## 2023-06-03 DIAGNOSIS — I25110 Atherosclerotic heart disease of native coronary artery with unstable angina pectoris: Secondary | ICD-10-CM | POA: Diagnosis not present

## 2023-06-03 DIAGNOSIS — Z7984 Long term (current) use of oral hypoglycemic drugs: Secondary | ICD-10-CM | POA: Diagnosis not present

## 2023-06-03 DIAGNOSIS — R0789 Other chest pain: Secondary | ICD-10-CM | POA: Diagnosis not present

## 2023-06-03 DIAGNOSIS — Z8249 Family history of ischemic heart disease and other diseases of the circulatory system: Secondary | ICD-10-CM | POA: Diagnosis not present

## 2023-06-03 DIAGNOSIS — I4729 Other ventricular tachycardia: Secondary | ICD-10-CM | POA: Diagnosis not present

## 2023-06-03 DIAGNOSIS — R5381 Other malaise: Secondary | ICD-10-CM | POA: Diagnosis present

## 2023-06-03 DIAGNOSIS — E7800 Pure hypercholesterolemia, unspecified: Secondary | ICD-10-CM | POA: Diagnosis not present

## 2023-06-03 DIAGNOSIS — Z7902 Long term (current) use of antithrombotics/antiplatelets: Secondary | ICD-10-CM | POA: Diagnosis not present

## 2023-06-03 DIAGNOSIS — N1831 Chronic kidney disease, stage 3a: Secondary | ICD-10-CM | POA: Diagnosis present

## 2023-06-03 DIAGNOSIS — I420 Dilated cardiomyopathy: Secondary | ICD-10-CM | POA: Diagnosis not present

## 2023-06-03 DIAGNOSIS — I428 Other cardiomyopathies: Secondary | ICD-10-CM | POA: Diagnosis present

## 2023-06-03 DIAGNOSIS — I200 Unstable angina: Secondary | ICD-10-CM | POA: Diagnosis not present

## 2023-06-03 DIAGNOSIS — Z5986 Financial insecurity: Secondary | ICD-10-CM | POA: Diagnosis not present

## 2023-06-03 DIAGNOSIS — I255 Ischemic cardiomyopathy: Secondary | ICD-10-CM | POA: Diagnosis present

## 2023-06-03 DIAGNOSIS — Z87891 Personal history of nicotine dependence: Secondary | ICD-10-CM | POA: Diagnosis not present

## 2023-06-03 DIAGNOSIS — I2510 Atherosclerotic heart disease of native coronary artery without angina pectoris: Secondary | ICD-10-CM | POA: Diagnosis not present

## 2023-06-03 DIAGNOSIS — I2583 Coronary atherosclerosis due to lipid rich plaque: Secondary | ICD-10-CM | POA: Diagnosis not present

## 2023-06-03 DIAGNOSIS — N1832 Chronic kidney disease, stage 3b: Secondary | ICD-10-CM | POA: Diagnosis not present

## 2023-06-03 DIAGNOSIS — Z888 Allergy status to other drugs, medicaments and biological substances status: Secondary | ICD-10-CM | POA: Diagnosis not present

## 2023-06-03 DIAGNOSIS — I5022 Chronic systolic (congestive) heart failure: Secondary | ICD-10-CM | POA: Diagnosis present

## 2023-06-03 DIAGNOSIS — I252 Old myocardial infarction: Secondary | ICD-10-CM | POA: Diagnosis not present

## 2023-06-03 DIAGNOSIS — I130 Hypertensive heart and chronic kidney disease with heart failure and stage 1 through stage 4 chronic kidney disease, or unspecified chronic kidney disease: Secondary | ICD-10-CM | POA: Diagnosis not present

## 2023-06-03 DIAGNOSIS — R079 Chest pain, unspecified: Principal | ICD-10-CM

## 2023-06-03 DIAGNOSIS — E785 Hyperlipidemia, unspecified: Secondary | ICD-10-CM | POA: Diagnosis not present

## 2023-06-03 DIAGNOSIS — Z886 Allergy status to analgesic agent status: Secondary | ICD-10-CM

## 2023-06-03 DIAGNOSIS — I5082 Biventricular heart failure: Secondary | ICD-10-CM | POA: Diagnosis not present

## 2023-06-03 DIAGNOSIS — I472 Ventricular tachycardia, unspecified: Secondary | ICD-10-CM | POA: Diagnosis not present

## 2023-06-03 DIAGNOSIS — Z955 Presence of coronary angioplasty implant and graft: Secondary | ICD-10-CM | POA: Diagnosis not present

## 2023-06-03 DIAGNOSIS — Z7982 Long term (current) use of aspirin: Secondary | ICD-10-CM

## 2023-06-03 DIAGNOSIS — I25118 Atherosclerotic heart disease of native coronary artery with other forms of angina pectoris: Secondary | ICD-10-CM | POA: Diagnosis not present

## 2023-06-03 LAB — BASIC METABOLIC PANEL
Anion gap: 12 (ref 5–15)
BUN: 24 mg/dL — ABNORMAL HIGH (ref 6–20)
CO2: 25 mmol/L (ref 22–32)
Calcium: 9.3 mg/dL (ref 8.9–10.3)
Chloride: 101 mmol/L (ref 98–111)
Creatinine, Ser: 1.87 mg/dL — ABNORMAL HIGH (ref 0.61–1.24)
GFR, Estimated: 42 mL/min — ABNORMAL LOW (ref >60)
Glucose, Bld: 92 mg/dL (ref 70–99)
Potassium: 4.3 mmol/L (ref 3.5–5.1)
Sodium: 138 mmol/L (ref 135–145)

## 2023-06-03 LAB — CBC WITH DIFFERENTIAL/PLATELET
Abs Immature Granulocytes: 0.02 10*3/uL (ref 0.00–0.07)
Basophils Absolute: 0 10*3/uL (ref 0.0–0.1)
Basophils Relative: 1 %
Eosinophils Absolute: 0.1 10*3/uL (ref 0.0–0.5)
Eosinophils Relative: 1 %
HCT: 42.5 % (ref 39.0–52.0)
Hemoglobin: 14.3 g/dL (ref 13.0–17.0)
Immature Granulocytes: 0 %
Lymphocytes Relative: 22 %
Lymphs Abs: 1.9 10*3/uL (ref 0.7–4.0)
MCH: 27.8 pg (ref 26.0–34.0)
MCHC: 33.6 g/dL (ref 30.0–36.0)
MCV: 82.5 fL (ref 80.0–100.0)
Monocytes Absolute: 0.8 10*3/uL (ref 0.1–1.0)
Monocytes Relative: 9 %
Neutro Abs: 5.8 10*3/uL (ref 1.7–7.7)
Neutrophils Relative %: 67 %
Platelets: 271 10*3/uL (ref 150–400)
RBC: 5.15 MIL/uL (ref 4.22–5.81)
RDW: 13.2 % (ref 11.5–15.5)
WBC: 8.6 10*3/uL (ref 4.0–10.5)
nRBC: 0 % (ref 0.0–0.2)

## 2023-06-03 LAB — TROPONIN I (HIGH SENSITIVITY)
Troponin I (High Sensitivity): 33 ng/L — ABNORMAL HIGH (ref <18)
Troponin I (High Sensitivity): 34 ng/L — ABNORMAL HIGH (ref <18)

## 2023-06-03 MED ORDER — FAMOTIDINE 20 MG PO TABS: 10.0000 mg | ORAL_TABLET | Freq: Every evening | ORAL | Status: DC | PRN

## 2023-06-03 MED ORDER — FUROSEMIDE 20 MG PO TABS: 20.0000 mg | ORAL_TABLET | Freq: Every day | ORAL | Status: DC | PRN

## 2023-06-03 MED ORDER — ISOSORBIDE MONONITRATE ER 60 MG PO TB24
90.0000 mg | ORAL_TABLET | Freq: Every day | ORAL | Status: DC
  Filled 2023-06-03: qty 2

## 2023-06-03 MED ORDER — ASPIRIN 81 MG PO TBEC
81.0000 mg | DELAYED_RELEASE_TABLET | Freq: Every day | ORAL | Status: DC
  Administered 2023-06-04 – 2023-06-05 (×2): 81 mg via ORAL
  Filled 2023-06-03 (×2): qty 1

## 2023-06-03 MED ORDER — SACUBITRIL-VALSARTAN 24-26 MG PO TABS
1.0000 | ORAL_TABLET | Freq: Two times a day (BID) | ORAL | Status: DC
  Administered 2023-06-04 – 2023-06-05 (×4): 1 via ORAL
  Filled 2023-06-03 (×5): qty 1

## 2023-06-03 MED ORDER — CARVEDILOL 6.25 MG PO TABS
18.7500 mg | ORAL_TABLET | Freq: Two times a day (BID) | ORAL | Status: DC
  Administered 2023-06-04 – 2023-06-05 (×3): 18.75 mg via ORAL
  Filled 2023-06-03: qty 1
  Filled 2023-06-03 (×2): qty 3

## 2023-06-03 MED ORDER — INSULIN ASPART 100 UNIT/ML IJ SOLN
0.0000 [IU] | Freq: Three times a day (TID) | INTRAMUSCULAR | Status: DC
Start: 2023-06-04 — End: 2023-06-05

## 2023-06-03 MED ORDER — HEPARIN BOLUS VIA INFUSION
4000.0000 [IU] | Freq: Once | INTRAVENOUS | Status: AC
  Administered 2023-06-03: 4000 [IU] via INTRAVENOUS
  Filled 2023-06-03: qty 4000

## 2023-06-03 MED ORDER — ASPIRIN 81 MG PO TBEC: 81.0000 mg | DELAYED_RELEASE_TABLET | Freq: Every day | ORAL | Status: DC

## 2023-06-03 MED ORDER — FENOFIBRATE 54 MG PO TABS
54.0000 mg | ORAL_TABLET | Freq: Every day | ORAL | Status: DC
  Administered 2023-06-04 – 2023-06-05 (×2): 54 mg via ORAL
  Filled 2023-06-03 (×2): qty 1

## 2023-06-03 MED ORDER — ACETAMINOPHEN 325 MG PO TABS
650.0000 mg | ORAL_TABLET | ORAL | Status: DC | PRN
  Administered 2023-06-05: 650 mg via ORAL
  Filled 2023-06-03: qty 2

## 2023-06-03 MED ORDER — ONDANSETRON HCL 4 MG/2ML IJ SOLN: 4.0000 mg | Freq: Four times a day (QID) | INTRAMUSCULAR | Status: DC | PRN

## 2023-06-03 MED ORDER — RANOLAZINE ER 500 MG PO TB12
500.0000 mg | ORAL_TABLET | Freq: Two times a day (BID) | ORAL | Status: DC
  Administered 2023-06-04: 500 mg via ORAL
  Filled 2023-06-03 (×2): qty 1

## 2023-06-03 MED ORDER — ATORVASTATIN CALCIUM 80 MG PO TABS
80.0000 mg | ORAL_TABLET | Freq: Every evening | ORAL | Status: DC
  Administered 2023-06-04 (×2): 80 mg via ORAL
  Filled 2023-06-03: qty 4

## 2023-06-03 MED ORDER — HEPARIN (PORCINE) 25000 UT/250ML-% IV SOLN
1350.0000 [IU]/h | INTRAVENOUS | Status: DC
  Administered 2023-06-03: 1350 [IU]/h via INTRAVENOUS
  Filled 2023-06-03: qty 250

## 2023-06-03 MED ORDER — EZETIMIBE 10 MG PO TABS
10.0000 mg | ORAL_TABLET | Freq: Every day | ORAL | Status: DC
  Administered 2023-06-04 – 2023-06-05 (×2): 10 mg via ORAL
  Filled 2023-06-03: qty 1

## 2023-06-03 MED ORDER — NITROGLYCERIN 0.4 MG SL SUBL: 0.4000 mg | SUBLINGUAL_TABLET | SUBLINGUAL | Status: DC | PRN

## 2023-06-03 MED ORDER — EMPAGLIFLOZIN 10 MG PO TABS
10.0000 mg | ORAL_TABLET | Freq: Every day | ORAL | Status: DC
  Administered 2023-06-04 – 2023-06-05 (×2): 10 mg via ORAL
  Filled 2023-06-03 (×2): qty 1

## 2023-06-03 MED ORDER — SPIRONOLACTONE 25 MG PO TABS
25.0000 mg | ORAL_TABLET | Freq: Every day | ORAL | Status: DC
  Administered 2023-06-04 – 2023-06-05 (×2): 25 mg via ORAL
  Filled 2023-06-03 (×2): qty 1

## 2023-06-03 MED ORDER — INSULIN ASPART 100 UNIT/ML IJ SOLN: 0.0000 [IU] | Freq: Every day | INTRAMUSCULAR | Status: DC

## 2023-06-03 NOTE — ED Provider Notes (Signed)
[facility] Provider Note    Event Date/Time   First MD Initiated Contact with Patient 06/03/23 1732     (approximate)   History   Chest Pain   HPI  Clarence [name] is a 53 y.o. male hypertension, Crohn's, pulmonary hypertension, CAD, NSTEMI, CHF and as listed in EMR presents to the emergency department for evaluation of chest pain.  While here with his mother, he developed central chest pressure.  He denies shortness of breath, nausea, diaphoresis. This feels similar to previous MI.     Physical Exam   Triage Vital Signs: ED Triage Vitals [06/03/23 1705]  Encounter Vitals Group     BP 109/87     Systolic BP Percentile      Diastolic BP Percentile      Pulse Rate 73     Resp 18     Temp 97.7 F (36.5 C)     Temp Source Oral     SpO2 99 %     Weight 240 lb (108.9 kg)     Height      Head Circumference      Peak Flow      Pain Score 2     Pain Loc      Pain Education      Exclude from Growth Chart     Most recent vital signs: Vitals:   06/03/23 1705  BP: 109/87  Pulse: 73  Resp: 18  Temp: 97.7 F (36.5 C)  SpO2: 99%    General: Awake, no distress.  CV:  Good peripheral perfusion.  Resp:  Normal effort. Breath sounds clear to auscultation Abd:  No distention.  Other:     ED Results / Procedures / Treatments   Labs (all labs ordered are listed, but only abnormal results are displayed) Labs Reviewed  BASIC METABOLIC PANEL - Abnormal; Notable for the following components:      Result Value   BUN 24 (*)    Creatinine, Ser 1.87 (*)    GFR, Estimated 42 (*)    All other components within normal limits  TROPONIN I (HIGH SENSITIVITY) - Abnormal; Notable for the following components:   Troponin I (High Sensitivity) 33 (*)    All other components within normal limits  TROPONIN I (HIGH SENSITIVITY) - Abnormal; Notable for the following components:   Troponin I (High Sensitivity) 34 (*)    All other components within normal  limits  CBC WITH DIFFERENTIAL/PLATELET  HEPARIN LEVEL (UNFRACTIONATED)     EKG  Sinus rhythm, rate of 77. 1mm or less elevation in II, III, aVf.  (Old EKG from 11/24 looks similar.)  Repeat EKG: Sinus rhythm without ST elevation   RADIOLOGY  Image and radiology report reviewed and interpreted by me. Radiology report consistent with the same.  Chest x-ray negative for acute cardiopulmonary abnormality.  PROCEDURES:  Critical Care performed: No  Procedures   MEDICATIONS ORDERED IN ED:  Medications  heparin ADULT infusion 100 units/mL (25000 units/275mL) (1,350 Units/hr Intravenous New Bag/Given 06/03/23 2214)  heparin bolus via infusion 4,000 Units (4,000 Units Intravenous Bolus from Bag 06/03/23 2215)     IMPRESSION / MDM / ASSESSMENT AND PLAN / ED COURSE   I have reviewed the triage note.  Differential diagnosis includes, but is not limited to, cardiac event, anxiety, stress, CHF  Patient's presentation is most consistent with acute presentation with potential threat to life or bodily function.  The patient is on the cardiac monitor to evaluate for  evidence of arrhythmia and/or significant heart rate changes.  53 year old male presenting to the emergency department for treatment and evaluation of central chest pain.  See HPI for further details.  Past history includes left heart cath as recent as last fall where he received 2 stents. Echo in April of 2024 with EF of 45-50%.   Initial troponin elevated to 33. Plan will be to repeat in 2-3 hours. Patient not currently having the midsternal pain/pressure. BUN and creatinine 24 and 1.87 which appears to be patient's baseline. Chest x-ray is without acute concerns.  Cardiology was consulted.  Dr. Ladona Ridgel recommends admission and heparin.  This was discussed with the patient who states that he will not be able to stay if his mother, who is also a patient in the ER, gets discharged.  He declines heparin or further admission  plan until he knows what will happen with his mom tonight.  Patient's mother will need to be admitted therefore he agrees to his own need for admission due to high risk cardiac history and elevation of troponin.         FINAL CLINICAL IMPRESSION(S) / ED DIAGNOSES   Final diagnoses:  Chest pain with high risk for cardiac etiology     Rx / DC Orders   ED Discharge Orders     None        Note:  This document was prepared using Dragon voice recognition software and may include unintentional dictation errors.   Chinita Pester, [name] 06/03/23 2249    Corena Herter, MD 06/03/23 [phone]

## 2023-06-03 NOTE — Progress Notes (Signed)
PHARMACY - ANTICOAGULATION CONSULT NOTE  Pharmacy Consult for Heparin  Indication: chest pain/ACS  Allergies  Allergen Reactions   Ibuprofen Other (See Comments)    History of GIB with ibuprofen    Sulfacetamide Other (See Comments)   Neomycin-Bacitracin Zn-Polymyx     Other reaction(s): Other (See Comments)   Spironolactone     Hyperkalemia    Patient Measurements: Height: 5\' 10"  (177.8 cm) Weight: 108.8 kg (239 lb 13.8 oz) IBW/kg (Calculated) : 73 Heparin Dosing Weight: 96.5 kg   Vital Signs: Temp: 97.7 F (36.5 C) (01/16 1705) Temp Source: Oral (01/16 1705) BP: 109/87 (01/16 1705) Pulse Rate: 73 (01/16 1705)  Labs: Recent Labs    06/03/23 1710 06/03/23 1915  HGB 14.3  --   HCT 42.5  --   PLT 271  --   CREATININE 1.87*  --   TROPONINIHS 33* 34*    Estimated Creatinine Clearance: 55.8 mL/min (A) (by C-G formula based on SCr of 1.87 mg/dL (H)).   Medical History: Past Medical History:  Diagnosis Date   Ascending Aortic Dilation    a. 09/2021 Echo: Asc Ao 40mm.   CAD (coronary artery disease)    a. 07/2021 Cath: LM nl, LAD 17m, D2 40, LCX 65p/m, OM1 90, RCA 182m w/ L-.R collats to RPDA from septal 1/2-->Med rx.   Chronic HFrEF (heart failure with reduced ejection fraction) (HCC)    a. 07/2021 Echo: EF 30-35%, glob HK, mod LVH, GrII DD, nl RV fxn, mildly dil LA, mild-mod MR; b. 09/2021 Echo: EF 35-40%, glob HK, mod LVH, GrI DD, nl RV fxn, mildly dil RA, mild MR, Asc Ao 40mm.   CKD (chronic kidney disease) stage 2, GFR 60-89 ml/min    Crohn's disease (HCC)    Hypertension    Mixed Ischemic & Nonischemic cardiomyopathy    a. 07/2021 Echo: EF 30-35%; b. 07/2021 Cath: Occluded RCA w/ mod LAD/LCX dzs, and severe OM1 dzs-->Med Rx; c. 09/2021 Echo: EF 35-40%.   PAH (pulmonary artery hypertension) (HCC)    a. 07/2021 RHC: PA 67/30 (42).   Proteinuria     Medications:  (Not in a hospital admission)   Assessment: Pharmacy consulted to dose heparin in this 53 year old  male admitted with ACS/NSTEMI.  No prior anticoag noted. CrCl = 55.8 ml/min   Goal of Therapy:  Heparin level 0.3-0.7 units/ml Monitor platelets by anticoagulation protocol: Yes   Plan:  Give 4000 units bolus x 1 Start heparin infusion at 1350 units/hr Check anti-Xa level in 6 hours and daily while on heparin Continue to monitor H&H and platelets  Aaden Buckman D 06/03/2023,9:43 PM

## 2023-06-03 NOTE — ED Provider Notes (Addendum)
Shared visit   Past medical history significant for coronary artery disease with most recent stent placed 02/2023 who presents to the emergency department with chest pain.  Improved with nitroglycerin.  Initial troponin mildly elevated at 33, serial troponin elevated at 34.  Patient was given aspirin.  Consulted cardiology for further discussion given his high risk chest pain with elevated troponin.  Cardiology recommended heparin bolus and infusion and admission for NSTEMI.   Corena Herter, MD 06/03/23 Delaine Lame    Corena Herter, MD 06/03/23 2155

## 2023-06-03 NOTE — Assessment & Plan Note (Addendum)
Ischemic cardiomyopathy Clinically euvolemic Continue GDMT with spironolactone, Entresto, Imdur, carvedilol, Jardiance and continue Lasix

## 2023-06-03 NOTE — Assessment & Plan Note (Signed)
-

## 2023-06-03 NOTE — ED Provider Triage Note (Signed)
Emergency Medicine Provider Triage Evaluation Note  Clarence [name] , a 53 y.o. male  was evaluated in triage.  Pt complains of CP that began about 10 minutes ago. Patient was waiting with his mom who is also here when his pain began. Describes it as a light squeeze just below the left pec. He says that this has happened before, had a stent placed in the past, says it does not feel as bad as it did.   Review of his chart shows left heart cath on 03/02/23.  Review of Systems  Positive: CP Negative: SOB, N/V  Physical Exam  There were no vitals taken for this visit. Gen:   Awake, no distress   Resp:  Normal effort  MSK:   Moves extremities without difficulty  Other:    Medical Decision Making  Medically screening exam initiated at 4:55 PM.  Appropriate orders placed.  ADRI LEGNER was informed that the remainder of the evaluation will be completed by another provider, this initial triage assessment does not replace that evaluation, and the importance of remaining in the ED until their evaluation is complete.     Cameron Ali, PA-C 06/03/23 1658

## 2023-06-03 NOTE — Assessment & Plan Note (Signed)
No acute issues.

## 2023-06-03 NOTE — Assessment & Plan Note (Signed)
CAD with history of stent 02/2023 Chest pain resolved with nitroglycerin, troponins flat, EKG nonacute Continue heparin infusion for high risk chest pain Continue GDMT with carvedilol, atorvastatin, aspirin, Zetia and fenofibrate Nitroglycerin sublingual as needed chest pain, morphine for breakthrough Cardiology consulted to follow

## 2023-06-03 NOTE — Assessment & Plan Note (Signed)
 Renal function at baseline

## 2023-06-03 NOTE — ED Triage Notes (Signed)
Pt arrives with c/o CP that started in past couple of hours. Pt describes pain as a tightness. Pt took nitroglycerin x2. Pt has hx of MI. Pt denies SOB.

## 2023-06-03 NOTE — ED Notes (Signed)
See triage note  Presents with chest pain which started couple of hours PTA    States the pain is a tightness across chest

## 2023-06-03 NOTE — H&P (Signed)
History and Physical    Patient: Clarence [name] BJY:[id number] DOB: 07/08/1970 DOA: 06/03/2023 DOS: the patient was seen and examined on 06/03/2023 PCP: Caesar Bookman, NP  Patient coming from: Home  Chief Complaint:  Chief Complaint  Patient presents with   Chest Pain    HPI: Clarence [name] is a 53 y.o. male with medical history significant for HTN, CKD 3A, Crohn's disease, CAD s/p stent 02/2023 and ischemic cardiomyopathy with mildly reduced EF on cath, being admitted with high risk chest pain.  Patient developed chest pain typical to prior presentation for unstable angina.  He took nitroglycerin and chest pain was resolved by arrival.  He describes the pain as similar to prior to having his stent placed and it is worse when he lies on his left side.  He has no nausea, vomiting or diaphoresis and no shortness of breath.  The ED provider spoke with cardiologist, Dr. Mayford Knife who recommended heparin and admission. Additional ED course and data review: Vitals within normal limits.  Troponin 33-34, creatinine at baseline at 1.87.  Labs otherwise unremarkable EKG showed NSR at 80 with prior infarcts and no significant change from prior.  Chest x-ray was nonacute Patient started on heparin drip Hospitalist consulted for admission.   Review of Systems: As mentioned in the history of present illness. All other systems reviewed and are negative.  Past Medical History:  Diagnosis Date   Ascending Aortic Dilation    a. 09/2021 Echo: Asc Ao 40mm.   CAD (coronary artery disease)    a. 07/2021 Cath: LM nl, LAD 55m, D2 40, LCX 65p/m, OM1 90, RCA 125m w/ L-.R collats to RPDA from septal 1/2-->Med rx.   Chronic HFrEF (heart failure with reduced ejection fraction) (HCC)    a. 07/2021 Echo: EF 30-35%, glob HK, mod LVH, GrII DD, nl RV fxn, mildly dil LA, mild-mod MR; b. 09/2021 Echo: EF 35-40%, glob HK, mod LVH, GrI DD, nl RV fxn, mildly dil RA, mild MR, Asc Ao 40mm.   CKD (chronic kidney disease) stage  2, GFR 60-89 ml/min    Crohn's disease (HCC)    Hypertension    Mixed Ischemic & Nonischemic cardiomyopathy    a. 07/2021 Echo: EF 30-35%; b. 07/2021 Cath: Occluded RCA w/ mod LAD/LCX dzs, and severe OM1 dzs-->Med Rx; c. 09/2021 Echo: EF 35-40%.   PAH (pulmonary artery hypertension) (HCC)    a. 07/2021 RHC: PA 67/30 (42).   Proteinuria    Past Surgical History:  Procedure Laterality Date   COLECTOMY     CORONARY STENT INTERVENTION N/A 03/02/2023   Procedure: CORONARY STENT INTERVENTION;  Surgeon: Yvonne Kendall, MD;  Location: [facility] INVASIVE CV LAB;  Service: Cardiovascular;  Laterality: N/A;   LEFT HEART CATH AND CORONARY ANGIOGRAPHY Left 03/02/2023   Procedure: LEFT HEART CATH AND CORONARY ANGIOGRAPHY;  Surgeon: Yvonne Kendall, MD;  Location: [facility] INVASIVE CV LAB;  Service: Cardiovascular;  Laterality: Left;   RIGHT/LEFT HEART CATH AND CORONARY ANGIOGRAPHY N/A 08/07/2021   Procedure: RIGHT/LEFT HEART CATH AND CORONARY ANGIOGRAPHY;  Surgeon: Yvonne Kendall, MD;  Location: [facility] INVASIVE CV LAB;  Service: Cardiovascular;  Laterality: N/A;   RIGHT/LEFT HEART CATH AND CORONARY ANGIOGRAPHY N/A 09/29/2022   Procedure: RIGHT/LEFT HEART CATH AND CORONARY ANGIOGRAPHY;  Surgeon: Laurey Morale, MD;  Location: [facility] INVASIVE CV LAB;  Service: Cardiovascular;  Laterality: N/A;   Social History:  reports that he quit smoking about 21 months ago. His smoking use included cigars. He does not have any smokeless tobacco  history on file. He reports that he does not currently use alcohol after a past usage of about 1.0 standard drink of alcohol per week. He reports that he does not use drugs.  Allergies  Allergen Reactions   Ibuprofen Other (See Comments)    History of GIB with ibuprofen    Sulfacetamide Other (See Comments)   Neomycin-Bacitracin Zn-Polymyx     Other reaction(s): Other (See Comments)   Spironolactone     Hyperkalemia    Family History  Problem Relation Age of Onset   Hypertension  Mother    Cancer Maternal Aunt    Heart disease Maternal Uncle    Pancreatic cancer Maternal Uncle     Prior to Admission medications   Medication Sig Start Date End Date Taking? Authorizing Provider  Alirocumab (PRALUENT) 150 MG/ML SOAJ Inject 1 mL (150 mg total) into the skin every 14 (fourteen) days. 05/21/23  Yes Laurey Morale, MD  aspirin EC 81 MG tablet Take 1 tablet (81 mg total) by mouth daily. Swallow whole. 05/20/22  Yes Milford, Anderson Malta, [name]  atorvastatin (LIPITOR) 80 MG tablet Take 1 tablet (80 mg total) by mouth every evening. 10/29/22  Yes Milford, Anderson Malta, [name]  carvedilol (COREG) 12.5 MG tablet Take 1.5 tablets (18.75 mg total) by mouth 2 (two) times daily with a meal. 05/26/23  Yes Laurey Morale, MD  clopidogrel (PLAVIX) 75 MG tablet Take 1 tablet (75 mg total) by mouth daily. 04/30/23  Yes Laurey Morale, MD  empagliflozin (JARDIANCE) 10 MG TABS tablet Take 1 tablet (10 mg total) by mouth daily. 03/09/23  Yes Ngetich, Dinah C, NP  ezetimibe (ZETIA) 10 MG tablet Take 1 tablet (10 mg total) by mouth daily. 11/11/22  Yes Laurey Morale, MD  famotidine (PEPCID) 10 MG tablet Take 1 tablet (10 mg total) by mouth at bedtime as needed. 04/12/23  Yes Ngetich, Dinah C, NP  fenofibrate (TRICOR) 48 MG tablet Take 1 tablet (48 mg total) by mouth daily. 03/16/23  Yes Milford, Anderson Malta, [name]  furosemide (LASIX) 20 MG tablet Take 1 tablet (20 mg total) by mouth daily as needed. For weight gain of 3 lb in 24 hours or 5 lbs in one week 04/12/23  Yes Ngetich, Dinah C, NP  isosorbide mononitrate (IMDUR) 60 MG 24 hr tablet Take 1.5 tablets (90 mg total) by mouth daily. 02/26/23 06/03/23 Yes Milford, Anderson Malta, [name]  ondansetron (ZOFRAN) 4 MG tablet Take 1 tablet (4 mg total) by mouth every 8 (eight) hours as needed for nausea or vomiting. 04/12/23  Yes Ngetich, Dinah C, NP  pantoprazole (PROTONIX) 40 MG tablet Take 1 tablet (40 mg total) by mouth daily. 10/14/22  Yes Laurey Morale, MD   ranolazine (RANEXA) 500 MG 12 hr tablet TAKE 1 TABLET BY MOUTH TWICE A DAY Patient taking differently: Take 250 mg by mouth 4 (four) times daily as needed (Chest pain). 01/15/23  Yes Laurey Morale, MD  sacubitril-valsartan (ENTRESTO) 24-26 MG Take 1 tablet by mouth 2 (two) times daily. 05/25/23  Yes Laurey Morale, MD  Semaglutide-[facility]) 1 MG/0.5ML SOAJ Inject 1 mg into the skin once a week. 05/21/23  Yes Laurey Morale, MD  spironolactone (ALDACTONE) 25 MG tablet Take 1 tablet (25 mg total) by mouth daily. 04/30/23  Yes Laurey Morale, MD  acetaminophen (TYLENOL) 325 MG tablet Take 650 mg by mouth at bedtime.    [provider]  nitroGLYCERIN (NITROSTAT) 0.4 MG SL tablet Place  1 tablet (0.4 mg total) under the tongue every 5 (five) minutes as needed for chest pain. 07/29/22   Ngetich, Dinah C, NP  sacubitril-valsartan (ENTRESTO) 24-26 MG Take 1 tablet by mouth 2 (two) times daily. 05/26/23   Laurey Morale, MD    Physical Exam: Vitals:   06/03/23 1705 06/03/23 1823  BP: 109/87   Pulse: 73   Resp: 18   Temp: 97.7 F (36.5 C)   TempSrc: Oral   SpO2: 99%   Weight: 108.9 kg 108.8 kg  Height:  5\' 10"  (1.778 m)   Physical Exam Vitals and nursing note reviewed.  Constitutional:      General: He is not in acute distress. HENT:     Head: Normocephalic and atraumatic.  Cardiovascular:     Rate and Rhythm: Normal rate and regular rhythm.     Heart sounds: Normal heart sounds.  Pulmonary:     Effort: Pulmonary effort is normal.     Breath sounds: Normal breath sounds.  Abdominal:     Palpations: Abdomen is soft.     Tenderness: There is no abdominal tenderness.  Neurological:     Mental Status: Mental status is at baseline.     Labs on Admission: I have personally reviewed following labs and imaging studies  CBC: Recent Labs  Lab 06/03/23 1710  WBC 8.6  NEUTROABS 5.8  HGB 14.3  HCT 42.5  MCV 82.5  PLT 271   Basic Metabolic Panel: Recent  Labs  Lab 06/03/23 1710  NA 138  K 4.3  CL 101  CO2 25  GLUCOSE 92  BUN 24*  CREATININE 1.87*  CALCIUM 9.3   GFR: Estimated Creatinine Clearance: 55.8 mL/min (A) (by C-G formula based on SCr of 1.87 mg/dL (H)). Liver Function Tests: No results for input(s): "AST", "ALT", "ALKPHOS", "BILITOT", "PROT", "ALBUMIN" in the last 168 hours. No results for input(s): "LIPASE", "AMYLASE" in the last 168 hours. No results for input(s): "AMMONIA" in the last 168 hours. Coagulation Profile: No results for input(s): "INR", "PROTIME" in the last 168 hours. Cardiac Enzymes: No results for input(s): "CKTOTAL", "CKMB", "CKMBINDEX", "TROPONINI" in the last 168 hours. BNP (last 3 results) No results for input(s): "PROBNP" in the last 8760 hours. HbA1C: No results for input(s): "HGBA1C" in the last 72 hours. CBG: No results for input(s): "GLUCAP" in the last 168 hours. Lipid Profile: No results for input(s): "CHOL", "HDL", "LDLCALC", "TRIG", "CHOLHDL", "LDLDIRECT" in the last 72 hours. Thyroid Function Tests: No results for input(s): "TSH", "T4TOTAL", "FREET4", "T3FREE", "THYROIDAB" in the last 72 hours. Anemia Panel: No results for input(s): "VITAMINB12", "FOLATE", "FERRITIN", "TIBC", "IRON", "RETICCTPCT" in the last 72 hours. Urine analysis:    Component Value Date/Time   COLORURINE YELLOW (A) 11/10/2022 1553   APPEARANCEUR CLEAR (A) 11/10/2022 1553   APPEARANCEUR Clear 10/24/2012 1000   LABSPEC 1.016 11/10/2022 1553   LABSPEC 1.015 10/24/2012 1000   PHURINE 5.0 11/10/2022 1553   GLUCOSEU >=500 (A) 11/10/2022 1553   GLUCOSEU Negative 10/24/2012 1000   HGBUR NEGATIVE 11/10/2022 1553   BILIRUBINUR NEGATIVE 11/10/2022 1553   BILIRUBINUR Negative 10/24/2012 1000   KETONESUR NEGATIVE 11/10/2022 1553   PROTEINUR NEGATIVE 11/10/2022 1553   NITRITE NEGATIVE 11/10/2022 1553   LEUKOCYTESUR TRACE (A) 11/10/2022 1553   LEUKOCYTESUR Negative 10/24/2012 1000    Radiological Exams on  Admission: DG Chest 2 View Result Date: 06/03/2023 CLINICAL DATA:  Chest pain. EXAM: CHEST - 2 VIEW COMPARISON:  Radiographs 03/03/2023 and 02/23/2023.  CT 11/06/2022. FINDINGS: The  heart size and mediastinal contours are normal. The lungs are clear. There is no pleural effusion or pneumothorax. No acute osseous findings are identified. IMPRESSION: No evidence of acute cardiopulmonary process. Electronically Signed   By: Carey Bullocks M.D.   On: 06/03/2023 17:48     Data Reviewed: Relevant notes from primary care and specialist visits, past discharge summaries as available in EHR, including Care Everywhere. Prior diagnostic testing as pertinent to current admission diagnoses Updated medications and problem lists for reconciliation ED course, including vitals, labs, imaging, treatment and response to treatment Triage notes, nursing and pharmacy notes and ED provider's notes Notable results as noted in HPI   Assessment and Plan: * Unstable angina (HCC) CAD with history of stent 02/2023 Chest pain resolved with nitroglycerin, troponins flat, EKG nonacute Continue heparin infusion for high risk chest pain Continue GDMT with carvedilol, atorvastatin, aspirin, Zetia and fenofibrate Nitroglycerin sublingual as needed chest pain, morphine for breakthrough Cardiology consulted to follow  Chronic HFrEF (heart failure with reduced ejection fraction) (HCC) Ischemic cardiomyopathy Clinically euvolemic Continue GDMT with spironolactone, Entresto, Imdur, carvedilol, Jardiance and continue Lasix  Crohn's disease (HCC) No acute issues  Essential hypertension Continue home meds  CKD stage 3a, GFR 45-59 ml/min (HCC) Renal function at baseline     DVT prophylaxis: heparin  Consults: [facility] cardiology  Advance Care Planning:   Code Status: Prior   Family Communication: none  Disposition Plan: Back to previous home environment  Severity of Illness: The appropriate patient status for  this patient is INPATIENT. Inpatient status is judged to be reasonable and necessary in order to provide the required intensity of service to ensure the patient's safety. The patient's presenting symptoms, physical exam findings, and initial radiographic and laboratory data in the context of their chronic comorbidities is felt to place them at high risk for further clinical deterioration. Furthermore, it is not anticipated that the patient will be medically stable for discharge from the hospital within 2 midnights of admission.   * I certify that at the point of admission it is my clinical judgment that the patient will require inpatient hospital care spanning beyond 2 midnights from the point of admission due to high intensity of service, high risk for further deterioration and high frequency of surveillance required.*  Author: Andris Baumann, MD 06/03/2023 11:18 PM  For on call review [email or web address].

## 2023-06-04 DIAGNOSIS — I25118 Atherosclerotic heart disease of native coronary artery with other forms of angina pectoris: Secondary | ICD-10-CM

## 2023-06-04 DIAGNOSIS — I5022 Chronic systolic (congestive) heart failure: Secondary | ICD-10-CM

## 2023-06-04 DIAGNOSIS — N1831 Chronic kidney disease, stage 3a: Secondary | ICD-10-CM | POA: Diagnosis not present

## 2023-06-04 DIAGNOSIS — I200 Unstable angina: Secondary | ICD-10-CM | POA: Diagnosis not present

## 2023-06-04 LAB — CBC
HCT: 43.3 % (ref 39.0–52.0)
Hemoglobin: 14.7 g/dL (ref 13.0–17.0)
MCH: 27.5 pg (ref 26.0–34.0)
MCHC: 33.9 g/dL (ref 30.0–36.0)
MCV: 81.1 fL (ref 80.0–100.0)
Platelets: 234 10*3/uL (ref 150–400)
RBC: 5.34 MIL/uL (ref 4.22–5.81)
RDW: 13.3 % (ref 11.5–15.5)
WBC: 8.1 10*3/uL (ref 4.0–10.5)
nRBC: 0 % (ref 0.0–0.2)

## 2023-06-04 LAB — CREATININE, SERUM
Creatinine, Ser: 1.92 mg/dL — ABNORMAL HIGH (ref 0.61–1.24)
GFR, Estimated: 41 mL/min — ABNORMAL LOW (ref >60)

## 2023-06-04 LAB — CBG MONITORING, ED
Glucose-Capillary: 107 mg/dL — ABNORMAL HIGH (ref 70–99)
Glucose-Capillary: 113 mg/dL — ABNORMAL HIGH (ref 70–99)
Glucose-Capillary: 90 mg/dL (ref 70–99)
Glucose-Capillary: 99 mg/dL (ref 70–99)

## 2023-06-04 LAB — HEPARIN LEVEL (UNFRACTIONATED)
Heparin Unfractionated: >1.1 [IU]/mL — ABNORMAL HIGH (ref 0.30–0.70)
Heparin Unfractionated: 0.31 [IU]/mL (ref 0.30–0.70)

## 2023-06-04 LAB — HEMOGLOBIN A1C
Hgb A1c MFr Bld: 6.3 % — ABNORMAL HIGH (ref 4.8–5.6)
Mean Plasma Glucose: 134.11 mg/dL

## 2023-06-04 MED ORDER — HEPARIN SODIUM (PORCINE) 5000 UNIT/ML IJ SOLN
5000.0000 [IU] | Freq: Three times a day (TID) | INTRAMUSCULAR | Status: DC
  Administered 2023-06-04 – 2023-06-05 (×3): 5000 [IU] via SUBCUTANEOUS
  Filled 2023-06-04 (×3): qty 1

## 2023-06-04 MED ORDER — ISOSORBIDE MONONITRATE ER 30 MG PO TB24
30.0000 mg | ORAL_TABLET | Freq: Two times a day (BID) | ORAL | Status: DC
  Administered 2023-06-04 – 2023-06-05 (×3): 30 mg via ORAL
  Filled 2023-06-04 (×2): qty 1

## 2023-06-04 MED ORDER — CLOPIDOGREL BISULFATE 75 MG PO TABS
75.0000 mg | ORAL_TABLET | Freq: Every day | ORAL | Status: DC
  Administered 2023-06-04 – 2023-06-05 (×2): 75 mg via ORAL
  Filled 2023-06-04 (×2): qty 1

## 2023-06-04 NOTE — Progress Notes (Signed)
Progress Note   Patient: Clarence [name] ZOX:[id number] DOB: Sep 20, 1970 DOA: 06/03/2023     1 DOS: the patient was seen and examined on 06/04/2023   Brief hospital course: Clarence [name] is a 53 y.o. male with medical history significant for HTN, CKD 3A, Crohn's disease, CAD s/p stent 02/2023 and ischemic cardiomyopathy with mildly reduced EF on cath, being admitted with high risk chest pain.  Patient developed chest pain typical to prior presentation for unstable angina.  He took nitroglycerin and chest pain was resolved by arrival. ED provider spoke with cardiologist, Dr. Mayford Knife who recommended heparin and admission.  Assessment and Plan:  Unstable angina (HCC) CAD with history of stent 02/2023 Chest pain resolved with nitroglycerin, troponins flat, EKG nonacute Heparin infusion have been discontinued by cardiologist Continue home dual antiplatelet therapy Continue GDMT with carvedilol, atorvastatin, aspirin, Zetia and fenofibrate Nitroglycerin sublingual as needed chest pain, morphine for breakthrough Plan of care discussed with cardiologist   Chronic HFrEF (heart failure with reduced ejection fraction) (HCC) Ischemic cardiomyopathy Clinically euvolemic Continue GDMT with spironolactone, Entresto, Imdur, carvedilol, Jardiance and continue Lasix   Crohn's disease (HCC) No complaints of abdominal pains or bloody stool   Essential hypertension Continue home meds   CKD stage 3a, GFR 45-59 ml/min (HCC) Monitor renal function closely   DVT prophylaxis: heparin   Consults: [facility] cardiology   Advance Care Planning:   Code Status: Prior    Family Communication: none   Disposition Plan: Back to previous home environment    Subjective:  Patient seen and examined at bedside this morning He admits to improvement in chest pain Denies nausea vomiting abdominal pain  Physical Exam: Vitals and nursing note reviewed.  Constitutional:      General: He is not in acute  distress. HENT:     Head: Normocephalic and atraumatic.  Cardiovascular:     Rate and Rhythm: Normal rate and regular rhythm.     Heart sounds: Normal heart sounds.  Pulmonary:     Effort: Pulmonary effort is normal.     Breath sounds: Normal breath sounds.  Abdominal:     Palpations: Abdomen is soft.     Tenderness: There is no abdominal tenderness.  Neurological:     Mental Status: Mental status is at baseline.      Vitals:   06/04/23 0540 06/04/23 0601 06/04/23 0949 06/04/23 1428  BP: 98/75  110/70 112/70  Pulse: 87  80 90  Resp: 18  (!) 185 18  Temp:  97.8 F (36.6 C) 98 F (36.7 C)   TempSrc:  Oral Oral   SpO2: 98%  98% 98%  Weight:      Height:        Data Reviewed: Marland Kitchen I have reviewed patient's chest x-ray that did not show any acute intra pulmonary process     Latest Ref Rng & Units 06/04/2023   12:33 PM 06/03/2023    5:10 PM 05/26/2023   12:51 PM  BMP  Glucose 70 - 99 mg/dL  92  811   BUN 6 - 20 mg/dL  24  28   Creatinine 9.14 - 1.24 mg/dL 7.82  9.56  2.13   Sodium 135 - 145 mmol/L  138  144   Potassium 3.5 - 5.1 mmol/L  4.3  4.5   Chloride 98 - 111 mmol/L  101  108   CO2 22 - 32 mmol/L  25  25   Calcium 8.9 - 10.3 mg/dL  9.3  9.7  Latest Ref Rng & Units 06/04/2023   12:33 PM 06/03/2023    5:10 PM 03/23/2023    3:11 PM  CBC  WBC 4.0 - 10.5 K/uL 8.1  8.6  7.4   Hemoglobin 13.0 - 17.0 g/dL 16.1  09.6  04.5   Hematocrit 39.0 - 52.0 % 43.3  42.5  45.4   Platelets 150 - 400 K/uL 234  271  269       Time spent: 56 minutes  Author: Loyce Dys, MD 06/04/2023 3:04 PM  For on call review [email or web address].

## 2023-06-04 NOTE — Consult Note (Signed)
Cardiology Consultation:   Patient ID: Clarence [name]; [id number]; 1971-04-24   Admit date: 06/03/2023 Date of Consult: 06/04/2023  Primary Care Provider: Caesar Bookman, NP Primary Cardiologist: End Primary Electrophysiologist:  None Advanced Heart Failure: Shirlee Latch   Patient Profile:   Clarence [name] is a 53 y.o. 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 Para March.  History of Present Illness:   Clarence [name] was admitted to the hospital in 07/2021 with progressive dyspnea over the preceding several months and was found to have a new cardiomyopathy and atypical pneumonia.  Echo during the admission showed an EF of 30 to 35%, global hypokinesis, moderate LVH, grade 2 diastolic dysfunction, normal RV systolic function and ventricular cavity size, mildly dilated left atrium, mild to moderate mitral regurgitation, and an estimated right atrial pressure of 15 mmHg.  He was diuresed and GDMT was initiated with symptomatic improvement.  Subsequent R/LHC showed diffuse moderate to severe multivessel CAD including CTO of the mid RCA, diffuse plaquing of LAD with up to 60% stenosis in the mid vessel, 60 to 70% proximal/mid LCx stenosis, and 90% stenosis of OM1.  Moderately elevated left heart filling pressure, severely elevated right heart filling pressure, moderate to severe pulmonary hypertension, moderately reduced cardiac output/index as outlined below.  It was suspected his cardiomyopathy was a combination of ischemic and nonischemic confounded by his significant pulmonary hypertension and right heart failure, with medical management.  He was admitted again in 09/2021 with chest pain and hypertensive urgency.  Repeat echo showed an EF of 35 to 40%.  Renal artery  duplex was concerning for possible right renal artery stenosis.  CT of the abdomen and pelvis that showed no acute abnormality or significant renal artery stenosis.  He has had multiple ER visits and hospital admissions since the above.  Ring admission in 08/2022 for chest pain, with no evidence of ACS, echo showed an EF of 45 to 50% with inferior and inferolateral hypokinesis and normal RV function.  R/LHC in 09/2022 showed unchanged coronaries from prior study with CTO of the RCA with collaterals as well as a severe, diffuse disease of OM1 with nonobstructive disease in the LAD and LCx with medical management advised.  In the setting of ongoing complaints of chest discomfort he underwent repeat LHC on 03/02/2023 that showed severe two-vessel CAD with 99% proximal/mid OM1 stenosis and CTO of the mid RCA with left-to-right collaterals.  And LCx disease similar to prior cardiac catheterizations.  He underwent successful PCI/DES to OM1.  Following this, he noted an improvement in his functional status.  However, he has continued to report chest discomfort described as a "balloon inflating, as well as left-sided chest discomfort when laying on his left side.  Patient was in the ER waiting room accompanying his mother, and while in the waiting room waiting for her to be seen, he developed left-sided chest discomfort that is described as a "balloon inflating" and difficulty of the chest pain he has experienced over the years.  He also continues to note chest discomfort when laying on his left side.  He has been taking 1 sublingual nitroglycerin tab per day with symptomatic improvement.  While in the ER waiting room, waiting for his mother to be seen, he developed left-sided chest  discomfort, for which he took 2 sublingual nitroglycerin.  In this setting he checked himself in to be evaluated.  He was subsequently admitted to [facility] on the evening of 06/03/2023.  Vital signs were normal.  Initial high-sensitivity troponin of  33 with a delta troponin of 34.  BUN 24, serum creatinine 1.87 which appears to be consistent with prior readings.  CBC unremarkable.  Chest x-ray with no evidence of acute cardiopulmonary process.  Upon evaluation of the patient, he is a sleep in the same room as his mother, who is also under evaluation.  Patient is currently without symptoms of chest pain or cardiac decompensation.  He reports he typically feels well in the morning after taking "10 pills" and typically develops chest discomfort later on in the day prompting him to take a sublingual nitroglycerin.  He wonders if he should split up his pills to see if this would help with his chest discomfort that he experiences later in the day.  He also reports tachypalpitations with the ranolazine.    Past Medical History:  Diagnosis Date   Ascending Aortic Dilation    a. 09/2021 Echo: Asc Ao 40mm.   CAD (coronary artery disease)    a. 07/2021 Cath: LM nl, LAD 5m, D2 40, LCX 65p/m, OM1 90, RCA 180m w/ L-.R collats to RPDA from septal 1/2-->Med rx.   Chronic HFrEF (heart failure with reduced ejection fraction) (HCC)    a. 07/2021 Echo: EF 30-35%, glob HK, mod LVH, GrII DD, nl RV fxn, mildly dil LA, mild-mod MR; b. 09/2021 Echo: EF 35-40%, glob HK, mod LVH, GrI DD, nl RV fxn, mildly dil RA, mild MR, Asc Ao 40mm.   CKD (chronic kidney disease) stage 2, GFR 60-89 ml/min    Crohn's disease (HCC)    Hypertension    Mixed Ischemic & Nonischemic cardiomyopathy    a. 07/2021 Echo: EF 30-35%; b. 07/2021 Cath: Occluded RCA w/ mod LAD/LCX dzs, and severe OM1 dzs-->Med Rx; c. 09/2021 Echo: EF 35-40%.   PAH (pulmonary artery hypertension) (HCC)    a. 07/2021 RHC: PA 67/30 (42).   Proteinuria     Past Surgical History:  Procedure Laterality Date   COLECTOMY     CORONARY STENT INTERVENTION N/A 03/02/2023   Procedure: CORONARY STENT INTERVENTION;  Surgeon: Yvonne Kendall, MD;  Location: [facility] INVASIVE CV LAB;  Service: Cardiovascular;  Laterality: N/A;   LEFT  HEART CATH AND CORONARY ANGIOGRAPHY Left 03/02/2023   Procedure: LEFT HEART CATH AND CORONARY ANGIOGRAPHY;  Surgeon: Yvonne Kendall, MD;  Location: [facility] INVASIVE CV LAB;  Service: Cardiovascular;  Laterality: Left;   RIGHT/LEFT HEART CATH AND CORONARY ANGIOGRAPHY N/A 08/07/2021   Procedure: RIGHT/LEFT HEART CATH AND CORONARY ANGIOGRAPHY;  Surgeon: Yvonne Kendall, MD;  Location: [facility] INVASIVE CV LAB;  Service: Cardiovascular;  Laterality: N/A;   RIGHT/LEFT HEART CATH AND CORONARY ANGIOGRAPHY N/A 09/29/2022   Procedure: RIGHT/LEFT HEART CATH AND CORONARY ANGIOGRAPHY;  Surgeon: Laurey Morale, MD;  Location: [facility] INVASIVE CV LAB;  Service: Cardiovascular;  Laterality: N/A;     Home Meds: Prior to Admission medications   Medication Sig Start Date End Date Taking? Authorizing Provider  Alirocumab (PRALUENT) 150 MG/ML SOAJ Inject 1 mL (150 mg total) into the skin every 14 (fourteen) days. 05/21/23  Yes Laurey Morale, MD  aspirin EC 81 MG tablet Take 1 tablet (81 mg total) by mouth daily. Swallow whole. 05/20/22  Yes Milford, Anderson Malta, [name]  atorvastatin (LIPITOR) 80 MG tablet Take 1 tablet (80 mg  total) by mouth every evening. 10/29/22  Yes Milford, Anderson Malta, [name]  carvedilol (COREG) 12.5 MG tablet Take 1.5 tablets (18.75 mg total) by mouth 2 (two) times daily with a meal. 05/26/23  Yes Laurey Morale, MD  clopidogrel (PLAVIX) 75 MG tablet Take 1 tablet (75 mg total) by mouth daily. 04/30/23  Yes Laurey Morale, MD  empagliflozin (JARDIANCE) 10 MG TABS tablet Take 1 tablet (10 mg total) by mouth daily. 03/09/23  Yes Ngetich, Dinah C, NP  ezetimibe (ZETIA) 10 MG tablet Take 1 tablet (10 mg total) by mouth daily. 11/11/22  Yes Laurey Morale, MD  fenofibrate (TRICOR) 48 MG tablet Take 1 tablet (48 mg total) by mouth daily. 03/16/23  Yes Milford, Anderson Malta, [name]  furosemide (LASIX) 20 MG tablet Take 1 tablet (20 mg total) by mouth daily as needed. For weight gain of 3 lb in 24 hours or 5 lbs in one week  04/12/23  Yes Ngetich, Dinah C, NP  isosorbide mononitrate (IMDUR) 60 MG 24 hr tablet Take 1.5 tablets (90 mg total) by mouth daily. 02/26/23 06/03/23 Yes Milford, Anderson Malta, [name]  nitroGLYCERIN (NITROSTAT) 0.4 MG SL tablet Place 1 tablet (0.4 mg total) under the tongue every 5 (five) minutes as needed for chest pain. 07/29/22  Yes Ngetich, Dinah C, NP  ondansetron (ZOFRAN) 4 MG tablet Take 1 tablet (4 mg total) by mouth every 8 (eight) hours as needed for nausea or vomiting. 04/12/23  Yes Ngetich, Dinah C, NP  pantoprazole (PROTONIX) 40 MG tablet Take 1 tablet (40 mg total) by mouth daily. 10/14/22  Yes Laurey Morale, MD  ranolazine (RANEXA) 500 MG 12 hr tablet TAKE 1 TABLET BY MOUTH TWICE A DAY Patient taking differently: Take 250 mg by mouth 4 (four) times daily as needed (Chest pain). 01/15/23  Yes Laurey Morale, MD  sacubitril-valsartan (ENTRESTO) 24-26 MG Take 1 tablet by mouth 2 (two) times daily. 05/25/23  Yes Laurey Morale, MD  Semaglutide-Weight Management [facility] 1 MG/0.5ML SOAJ Inject 1 mg into the skin once a week. 05/21/23  Yes Laurey Morale, MD  spironolactone (ALDACTONE) 25 MG tablet Take 1 tablet (25 mg total) by mouth daily. 04/30/23  Yes Laurey Morale, MD  acetaminophen (TYLENOL) 325 MG tablet Take 650 mg by mouth at bedtime.    [provider]  famotidine (PEPCID) 10 MG tablet Take 1 tablet (10 mg total) by mouth at bedtime as needed. Patient not taking: Reported on 06/03/2023 04/12/23   Ngetich, Dinah C, NP  sacubitril-valsartan (ENTRESTO) 24-26 MG Take 1 tablet by mouth 2 (two) times daily. 05/26/23   Laurey Morale, MD    Inpatient Medications: Scheduled Meds:  aspirin EC  81 mg Oral Daily   atorvastatin  80 mg Oral QPM   carvedilol  18.75 mg Oral BID WC   empagliflozin  10 mg Oral Daily   ezetimibe  10 mg Oral Daily   fenofibrate  54 mg Oral Daily   insulin aspart  0-15 Units Subcutaneous TID WC   insulin aspart  0-5 Units Subcutaneous QHS   isosorbide  mononitrate  30 mg Oral BID   sacubitril-valsartan  1 tablet Oral BID   spironolactone  25 mg Oral Daily   Continuous Infusions:  heparin 1,350 Units/hr (06/03/23 2214)   PRN Meds: acetaminophen, famotidine, furosemide, nitroGLYCERIN, ondansetron (ZOFRAN) IV  Allergies:   Allergies  Allergen Reactions   Ibuprofen Other (See Comments)    History of GIB with ibuprofen  Sulfacetamide Other (See Comments)   Neomycin-Bacitracin Zn-Polymyx     Other reaction(s): Other (See Comments)   Spironolactone     Hyperkalemia    Social History:   Social History   Socioeconomic History   Marital status: Single    Spouse name: Not on file   Number of children: Not on file   Years of education: Not on file   Highest education level: Not on file  Occupational History   Not on file  Tobacco Use   Smoking status: Former    Types: Cigars    Quit date: 08/13/2021    Years since quitting: 1.8   Smokeless tobacco: Not on file  Vaping Use   Vaping status: Never Used  Substance and Sexual Activity   Alcohol use: Not Currently    Alcohol/week: 1.0 standard drink of alcohol    Types: 1 Standard drinks or equivalent per week   Drug use: Never   Sexual activity: Not Currently  Other Topics Concern   Not on file  Social History Narrative   Patient is a past smoker for 10 years, drinks decaf coffee and tea.   Patient is single and lives at home with his mother and uncle and has no pets.   Patient does not exercise daily due to difficulty walking distances.   Patient has no issues with his functional status and is independent in caring for himself   Social Drivers of Health   Financial Resource Strain: High Risk (01/29/2022)   Overall Financial Resource Strain (CARDIA)    Difficulty of Paying Living Expenses: Hard  Food Insecurity: Food Insecurity Present (03/02/2023)   Hunger Vital Sign    Worried About Running Out of Food in the Last Year: Never true    Ran Out of Food in the Last Year:  Sometimes true  Transportation Needs: No Transportation Needs (03/02/2023)   PRAPARE - Administrator, Civil Service (Medical): No    Lack of Transportation (Non-Medical): No  Physical Activity: Not on file  Stress: Not on file  Social Connections: Unknown (09/29/2021)   Received from [facility]   Social Network    Social Network: Not on file  Intimate Partner Violence: Not At Risk (03/02/2023)   Humiliation, Afraid, Rape, and Kick questionnaire    Fear of Current or Ex-Partner: No    Emotionally Abused: No    Physically Abused: No    Sexually Abused: No     Family History:   Family History  Problem Relation Age of Onset   Hypertension Mother    Cancer Maternal Aunt    Heart disease Maternal Uncle    Pancreatic cancer Maternal Uncle     ROS:  Review of Systems  Constitutional:  Positive for malaise/fatigue. Negative for chills, diaphoresis, fever and weight loss.  HENT:  Negative for congestion.   Eyes:  Negative for discharge and redness.  Respiratory:  Negative for cough, sputum production, shortness of breath and wheezing.   Cardiovascular:  Positive for chest pain. Negative for palpitations, orthopnea, claudication, leg swelling and PND.  Gastrointestinal:  Negative for abdominal pain, heartburn, nausea and vomiting.  Musculoskeletal:  Negative for falls and myalgias.  Skin:  Negative for rash.  Neurological:  Negative for dizziness, tingling, tremors, sensory change, speech change, focal weakness, loss of consciousness and weakness.  Endo/Heme/Allergies:  Does not bruise/bleed easily.  Psychiatric/Behavioral:  Negative for substance abuse. The patient is not nervous/anxious.   All other systems reviewed and are negative.  Physical Exam/Data:   Vitals:   06/04/23 0138 06/04/23 0540 06/04/23 0601 06/04/23 0949  BP: 97/76 98/75  110/70  Pulse: 77 87  80  Resp: 18 18  (!) 185  Temp:   97.8 F (36.6 C) 98 F (36.7 C)  TempSrc:    Oral Oral  SpO2: 99% 98%  98%  Weight:      Height:       No intake or output data in the 24 hours ending 06/04/23 1035 Filed Weights   06/03/23 1705 06/03/23 1823  Weight: 108.9 kg 108.8 kg   Body mass index is 34.42 kg/m.   Physical Exam: General: Well developed, well nourished, in no acute distress. Head: Normocephalic, atraumatic, sclera non-icteric, no xanthomas, nares without discharge.  Neck: Negative for carotid bruits. JVD not elevated. Lungs: Clear bilaterally to auscultation without wheezes, rales, or rhonchi. Breathing is unlabored. Heart: RRR with S1 S2. No murmurs, rubs, or gallops appreciated. Abdomen: Soft, non-tender, non-distended with normoactive bowel sounds. No hepatomegaly. No rebound/guarding. No obvious abdominal masses. Msk:  Strength and tone appear normal for age. Extremities: No clubbing or cyanosis. No edema. Distal pedal pulses are 2+ and equal bilaterally. Neuro: Alert and oriented X 3. No facial asymmetry. No focal deficit. Moves all extremities spontaneously. Psych:  Responds to questions appropriately with a normal affect.   EKG:  The EKG was personally reviewed and demonstrates: 06/03/2023, 16:56 -NSR, 77 bpm, ST elevation along leads III, aVF, V4 through V6, prior inferior infarct and T wave inversion in aVL.  Repeat EKG at 16:57 again showed sinus rhythm, 80 bpm, and evidence of inferior and anterolateral ST elevation.  Abnormalities are consistent with prior tracings.  Telemetry:  Telemetry was personally reviewed and demonstrates: Sinus rhythm with PVCs  Weights: Filed Weights   06/03/23 1705 06/03/23 1823  Weight: 108.9 kg 108.8 kg    Relevant CV Studies:  LHC 03/02/2023: Conclusions: Severe two-vessel coronary artery disease with 99% proximal/mid OM1 stenosis and chronic total occlusion of the mid RCA with left-to-right collaterals.  Moderate mid LAD and LCx disease is similar to prior catheterizations. Upper normal left ventricular  filling pressure (LVEDP 15 mmHg). Successful PCI to OM1 using Onyx Frontier 2.25 x 34 mm drug-eluting stent with 0% residual stenosis and TIMI-3 flow.   Recommendations: Overnight observation with gentle postcatheterization hydration in the setting of chronic kidney disease and heart failure with mildly reduced ejection fraction. Dual antiplatelet therapy with aspirin and clopidogrel for at least 6 months. Aggressive secondary prevention of coronary artery disease. Continue current antianginal and goal-directed heart failure therapies. __________  LHC 09/29/2022:   Mid RCA lesion is 100% stenosed.   2nd Diag lesion is 40% stenosed.   1st Mrg lesion is 90% stenosed.   Mid LAD lesion is 50% stenosed.   Prox Cx to Mid Cx lesion is 60% stenosed.   1. Normal filling pressures.  2. Preserved cardiac output.  3. Coronaries unchanged from prior study.  Has CTO RCA with collaterals.  There is severe, diffuse disease of OM1 with nonobstructive disease in the LAD and LCx.  Continue medical management.  __________  2D echo 09/11/2022: 1. Left ventricular ejection fraction, by estimation, is 45 to 50%. The  left ventricle has mildly decreased function. The left ventricle  demonstrates regional wall motion abnormalities (hypokinesis of the  inferior and inferolateral wall). There is  moderate left ventricular hypertrophy. Left ventricular diastolic  parameters are consistent with Grade I diastolic dysfunction (impaired  relaxation).   2.  Right ventricular systolic function is normal. The right ventricular  size is normal. Tricuspid regurgitation signal is inadequate for assessing  PA pressure.   3. The mitral valve is normal in structure. No evidence of mitral valve  regurgitation. No evidence of mitral stenosis.   4. The aortic valve is tricuspid. Aortic valve regurgitation is not  visualized. No aortic stenosis is present.   5. The inferior vena cava is normal in size with greater than 50%   respiratory variability, suggesting right atrial pressure of 3 mmHg.  __________  Renal artery duplex 10/08/2021: IMPRESSION: 1. Two right-sided renal arteries, single left-sided renal artery. 2. Elevated peak systolic velocity greater than 200 centimeters/second in 1 of the 2 right renal arteries raises possibility of underlying stenosis. CT or MR arteriogram of the abdomen could further evaluate. 3. No evidence of left renal artery stenosis. 4. Normal sonographic appearance of the kidneys without evidence of hydronephrosis. __________   2D echo 10/07/2021: 1. Left ventricular ejection fraction, by estimation, is 35 to 40%. The  left ventricle has moderately decreased function. The left ventricle  demonstrates global hypokinesis. There is moderate left ventricular  hypertrophy. Left ventricular diastolic  parameters are consistent with Grade I diastolic dysfunction (impaired  relaxation).   2. Right ventricular systolic function is normal. The right ventricular  size is normal.   3. Right atrial size was mildly dilated.   4. The mitral valve is normal in structure. Mild mitral valve  regurgitation. No evidence of mitral stenosis.   5. The aortic valve is tricuspid. Aortic valve regurgitation is not  visualized. No aortic stenosis is present.   6. There is mild dilatation of the ascending aorta, measuring 40 mm. __________   [facility] 08/07/2021: Conclusions: Diffuse moderate-severe, multivessel coronary artery disease, including chronic total occlusion of mid RCA, diffuse plaquing of LAD with up to 60% stenosis in the mid vessel, 60-70% proximal/mid LCx stenosis, and 90% lesion of OM1. Moderately elevated left heart filling pressures (LVEDP/PCWP 25 mmHg). Severely elevated right heart filling pressures (mean RA 16 mmHg, RVEDP 30 mmHg). Moderate-severe pulmonary hypertension (mean PAP 42 mmHg, PVR 4.0 WU). Moderately reduced Fick cardiac output/index (CO 4.2 L/min, CI 2.0  L/min/m^2).Marland Kitchen   Recommendations: Suspect cardiomyopathy is a combination of ischemic and nonischemic etiologies confounded by significant pulmonary hypertension and right heart failure that may be due to underlying lung disease.  Recommend continued diuresis and optimization of GDMT for acute HFrEF.  In the setting of ongoing wheezing with concern for obstructive lung disease, I will hold carvedilol for now and add Entresto. Secondary prevention of coronary artery disease, including high-intensity statin therapy.  Diffuse nature of CAD is not ideal for percutaneous or surgical revascularization.  Mild troponin elevation on admission is most likely due to demand ischemia in the setting of acute HFrEF and chronic ischemic heart disease. __________   2D echo 08/05/2021:  1. Left ventricular ejection fraction, by estimation, is 30 to 35%. The  left ventricle has moderately decreased function. The left ventricle  demonstrates global hypokinesis. There is moderate left ventricular  hypertrophy. Left ventricular diastolic  parameters are consistent with Grade II diastolic dysfunction  (pseudonormalization).   2. Right ventricular systolic function is normal. The right ventricular  size is normal. Tricuspid regurgitation signal is inadequate for assessing  PA pressure.   3. Left atrial size was mildly dilated.   4. The mitral valve is abnormal. Mild to moderate mitral valve  regurgitation. No evidence of mitral stenosis.   5. The  aortic valve has an indeterminant number of cusps. Aortic valve  regurgitation is not visualized. No aortic stenosis is present.   6. The inferior vena cava is dilated in size with <50% respiratory  variability, suggesting right atrial pressure of 15 mmHg.   Laboratory Data:  Chemistry Recent Labs  Lab 06/03/23 1710  NA 138  K 4.3  CL 101  CO2 25  GLUCOSE 92  BUN 24*  CREATININE 1.87*  CALCIUM 9.3  GFRNONAA 42*  ANIONGAP 12    No results for input(s):  "PROT", "ALBUMIN", "AST", "ALT", "ALKPHOS", "BILITOT" in the last 168 hours. Hematology Recent Labs  Lab 06/03/23 1710  WBC 8.6  RBC 5.15  HGB 14.3  HCT 42.5  MCV 82.5  MCH 27.8  MCHC 33.6  RDW 13.2  PLT 271   Cardiac EnzymesNo results for input(s): "TROPONINI" in the last 168 hours. No results for input(s): "TROPIPOC" in the last 168 hours.  BNPNo results for input(s): "BNP", "PROBNP" in the last 168 hours.  DDimer No results for input(s): "DDIMER" in the last 168 hours.  Radiology/Studies:  DG Chest 2 View Result Date: 06/03/2023 IMPRESSION: No evidence of acute cardiopulmonary process. Electronically Signed   By: Carey Bullocks M.D.   On: 06/03/2023 17:48    Assessment and Plan:   1.  CAD involving the native coronary arteries with stable angina and mildly elevated high-sensitivity troponin: -Currently without symptoms of angina or cardiac decompensation -Minimal high-sensitivity troponin is flat trending and consistent with prior readings, not consistent with ACS -Discontinue heparin drip -We discussed repeating cardiac cath versus medication management, patient would like to see how his symptoms trend with transitioning some of his pharmacotherapy to the afternoon hours before considering cardiac cath -Discontinue ranolazine due to patient reported intolerance -Transition Imdur from 90 mg in the morning to 30 mg twice daily, deferred further titration of Imdur due to relative hypotension in the past -Continue carvedilol for antianginal benefit as well -He will otherwise remain on aspirin, atorvastatin, and ezetimibe  2.  HFrEF secondary to mixed ICM and NICM: -Appears euvolemic and well compensated with NYHA class II symptoms -Continue current GDMT including Entresto, spironolactone, carvedilol, and Jardiance -Not requiring a standing loop diuretic -Followed by the advanced heart failure team as an outpatient  3.  HTN: -Blood pressure well-controlled -Continue  pharmacotherapy as outlined above  4.  HLD: -LDL 59 -Remains on atorvastatin, ezetimibe, and fenofibrate  5.  CKD stage III with history of hyperkalemia: -Renal function and potassium stable      For questions or updates, please contact [facility] Please consult [email or web address] for contact info under Cardiology/STEMI.   Signed, Eula Listen, PA-C [facility] Pager: [phone] 06/04/2023, 10:35 AM

## 2023-06-04 NOTE — Progress Notes (Signed)
PHARMACY - ANTICOAGULATION CONSULT NOTE  Pharmacy Consult for Heparin  Indication: chest pain/ACS  Allergies  Allergen Reactions   Ibuprofen Other (See Comments)    History of GIB with ibuprofen    Sulfacetamide Other (See Comments)   Neomycin-Bacitracin Zn-Polymyx     Other reaction(s): Other (See Comments)   Spironolactone     Hyperkalemia    Patient Measurements: Height: 5\' 10"  (177.8 cm) Weight: 108.8 kg (239 lb 13.8 oz) IBW/kg (Calculated) : 73 Heparin Dosing Weight: 96.5 kg   Vital Signs: Temp: 97.7 F (36.5 C) (01/16 1705) Temp Source: Oral (01/16 1705) BP: 97/76 (01/17 0138) Pulse Rate: 77 (01/17 0138)  Labs: Recent Labs    06/03/23 1710 06/03/23 1915 06/04/23 0347 06/04/23 0425  HGB 14.3  --   --   --   HCT 42.5  --   --   --   PLT 271  --   --   --   HEPARINUNFRC  --   --  >1.10* 0.31  CREATININE 1.87*  --   --   --   TROPONINIHS 33* 34*  --   --     Estimated Creatinine Clearance: 55.8 mL/min (A) (by C-G formula based on SCr of 1.87 mg/dL (H)).   Medical History: Past Medical History:  Diagnosis Date   Ascending Aortic Dilation    a. 09/2021 Echo: Asc Ao 40mm.   CAD (coronary artery disease)    a. 07/2021 Cath: LM nl, LAD 67m, D2 40, LCX 65p/m, OM1 90, RCA 140m w/ L-.R collats to RPDA from septal 1/2-->Med rx.   Chronic HFrEF (heart failure with reduced ejection fraction) (HCC)    a. 07/2021 Echo: EF 30-35%, glob HK, mod LVH, GrII DD, nl RV fxn, mildly dil LA, mild-mod MR; b. 09/2021 Echo: EF 35-40%, glob HK, mod LVH, GrI DD, nl RV fxn, mildly dil RA, mild MR, Asc Ao 40mm.   CKD (chronic kidney disease) stage 2, GFR 60-89 ml/min    Crohn's disease (HCC)    Hypertension    Mixed Ischemic & Nonischemic cardiomyopathy    a. 07/2021 Echo: EF 30-35%; b. 07/2021 Cath: Occluded RCA w/ mod LAD/LCX dzs, and severe OM1 dzs-->Med Rx; c. 09/2021 Echo: EF 35-40%.   PAH (pulmonary artery hypertension) (HCC)    a. 07/2021 RHC: PA 67/30 (42).   Proteinuria      Medications:  (Not in a hospital admission)   Assessment: Pharmacy consulted to dose heparin in this 53 year old male admitted with ACS/NSTEMI.  No prior anticoag noted. CrCl = 55.8 ml/min   Goal of Therapy:  Heparin level 0.3-0.7 units/ml Monitor platelets by anticoagulation protocol: Yes   Plan:  1/17:  HL @ 0347 = > 1.10, SUPRAtherapeutic - spoke with RN who drew sample,  sample was drawn from same arm as heparin infusion.  STAT repeat ordered  1/17:  HL @ 0425 = 0.31, therapeutic X 1 - will continue pt on current rate and recheck HL in 6 hrs.   Jamareon Shimel D 06/04/2023,4:59 AM

## 2023-06-05 DIAGNOSIS — I2510 Atherosclerotic heart disease of native coronary artery without angina pectoris: Secondary | ICD-10-CM

## 2023-06-05 DIAGNOSIS — N1832 Chronic kidney disease, stage 3b: Secondary | ICD-10-CM

## 2023-06-05 DIAGNOSIS — I420 Dilated cardiomyopathy: Secondary | ICD-10-CM | POA: Diagnosis not present

## 2023-06-05 DIAGNOSIS — I4729 Other ventricular tachycardia: Secondary | ICD-10-CM

## 2023-06-05 DIAGNOSIS — I5022 Chronic systolic (congestive) heart failure: Secondary | ICD-10-CM | POA: Diagnosis not present

## 2023-06-05 DIAGNOSIS — I2583 Coronary atherosclerosis due to lipid rich plaque: Secondary | ICD-10-CM

## 2023-06-05 DIAGNOSIS — I200 Unstable angina: Secondary | ICD-10-CM

## 2023-06-05 DIAGNOSIS — I255 Ischemic cardiomyopathy: Secondary | ICD-10-CM

## 2023-06-05 DIAGNOSIS — I25118 Atherosclerotic heart disease of native coronary artery with other forms of angina pectoris: Secondary | ICD-10-CM | POA: Diagnosis not present

## 2023-06-05 DIAGNOSIS — E7800 Pure hypercholesterolemia, unspecified: Secondary | ICD-10-CM | POA: Diagnosis not present

## 2023-06-05 LAB — BASIC METABOLIC PANEL
Anion gap: 9 (ref 5–15)
BUN: 25 mg/dL — ABNORMAL HIGH (ref 6–20)
CO2: 25 mmol/L (ref 22–32)
Calcium: 9.1 mg/dL (ref 8.9–10.3)
Chloride: 107 mmol/L (ref 98–111)
Creatinine, Ser: 1.88 mg/dL — ABNORMAL HIGH (ref 0.61–1.24)
GFR, Estimated: 42 mL/min — ABNORMAL LOW (ref >60)
Glucose, Bld: 103 mg/dL — ABNORMAL HIGH (ref 70–99)
Potassium: 3.9 mmol/L (ref 3.5–5.1)
Sodium: 141 mmol/L (ref 135–145)

## 2023-06-05 LAB — CBC WITH DIFFERENTIAL/PLATELET
Abs Immature Granulocytes: 0.02 10*3/uL (ref 0.00–0.07)
Basophils Absolute: 0 10*3/uL (ref 0.0–0.1)
Basophils Relative: 0 %
Eosinophils Absolute: 0.1 10*3/uL (ref 0.0–0.5)
Eosinophils Relative: 2 %
HCT: 40 % (ref 39.0–52.0)
Hemoglobin: 13.3 g/dL (ref 13.0–17.0)
Immature Granulocytes: 0 %
Lymphocytes Relative: 27 %
Lymphs Abs: 1.7 10*3/uL (ref 0.7–4.0)
MCH: 27.7 pg (ref 26.0–34.0)
MCHC: 33.3 g/dL (ref 30.0–36.0)
MCV: 83.3 fL (ref 80.0–100.0)
Monocytes Absolute: 0.6 10*3/uL (ref 0.1–1.0)
Monocytes Relative: 10 %
Neutro Abs: 3.9 10*3/uL (ref 1.7–7.7)
Neutrophils Relative %: 61 %
Platelets: 200 10*3/uL (ref 150–400)
RBC: 4.8 MIL/uL (ref 4.22–5.81)
RDW: 13.2 % (ref 11.5–15.5)
WBC: 6.4 10*3/uL (ref 4.0–10.5)
nRBC: 0 % (ref 0.0–0.2)

## 2023-06-05 LAB — GLUCOSE, CAPILLARY
Glucose-Capillary: 106 mg/dL — ABNORMAL HIGH (ref 70–99)
Glucose-Capillary: 110 mg/dL — ABNORMAL HIGH (ref 70–99)

## 2023-06-05 LAB — MAGNESIUM: Magnesium: 2.2 mg/dL (ref 1.7–2.4)

## 2023-06-05 MED ORDER — PANTOPRAZOLE SODIUM 40 MG PO TBEC
40.0000 mg | DELAYED_RELEASE_TABLET | Freq: Every day | ORAL | Status: DC
  Administered 2023-06-05: 40 mg via ORAL
  Filled 2023-06-05: qty 1

## 2023-06-05 NOTE — Discharge Summary (Signed)
Physician Discharge Summary   Patient: Clarence [name] MRN: [id number] DOB: Dec 07, 1970  Admit date:     06/03/2023  Discharge date: 06/05/23  Discharge Physician: Loyce Dys   PCP: Caesar Bookman, NP   Recommendations at discharge:  Follow-up with your cardiologist  Discharge Diagnoses: Coronary artery disease Chronic HFrEF (heart failure with reduced ejection fraction) (HCC) Ischemic cardiomyopathy Crohn's disease (HCC) Essential hypertension CKD stage 3a, GFR 45-59 ml/min [facility])     Hospital Course: Clarence [name] is a 53 y.o. male with medical history significant for HTN, CKD 3A, Crohn's disease, CAD s/p stent 02/2023 and ischemic cardiomyopathy with mildly reduced EF on cath, being admitted with high risk chest pain.  Patient developed chest pain typical to prior presentation for unstable angina.  He took nitroglycerin and chest pain was resolved by arrival.  Patient was seen by cardiology team and no cardiac procedures planned.  He admits to improvement in his condition and therefore being discharged to follow-up as an outpatient on current medications as outlined by cardiology.   Consultants: Cardiology Procedures performed: None Disposition: Home Diet recommendation:  Cardiac diet DISCHARGE MEDICATION: Allergies as of 06/05/2023       Reactions   Ibuprofen Other (See Comments)   History of GIB with ibuprofen    Sulfacetamide Other (See Comments)   Neomycin-bacitracin Zn-polymyx    Other reaction(s): Other (See Comments)   Spironolactone    Hyperkalemia        Medication List     STOP taking these medications    ondansetron 4 MG tablet Commonly known as: ZOFRAN   ranolazine 500 MG 12 hr tablet Commonly known as: RANEXA       TAKE these medications    acetaminophen 325 MG tablet Commonly known as: TYLENOL Take 650 mg by mouth at bedtime.   aspirin EC 81 MG tablet Take 1 tablet (81 mg total) by mouth daily. Swallow whole.   atorvastatin 80  MG tablet Commonly known as: LIPITOR Take 1 tablet (80 mg total) by mouth every evening.   carvedilol 12.5 MG tablet Commonly known as: COREG Take 1.5 tablets (18.75 mg total) by mouth 2 (two) times daily with a meal.   clopidogrel 75 MG tablet Commonly known as: PLAVIX Take 1 tablet (75 mg total) by mouth daily.   empagliflozin 10 MG Tabs tablet Commonly known as: Jardiance Take 1 tablet (10 mg total) by mouth daily.   Entresto 24-26 MG Generic drug: sacubitril-valsartan Take 1 tablet by mouth 2 (two) times daily. What changed: Another medication with the same name was removed. Continue taking this medication, and follow the directions you see here.   ezetimibe 10 MG tablet Commonly known as: ZETIA Take 1 tablet (10 mg total) by mouth daily.   Famotidine Orig St 10 MG tablet Generic drug: famotidine Take 1 tablet (10 mg total) by mouth at bedtime as needed.   fenofibrate 48 MG tablet Commonly known as: Tricor Take 1 tablet (48 mg total) by mouth daily.   furosemide 20 MG tablet Commonly known as: LASIX Take 1 tablet (20 mg total) by mouth daily as needed. For weight gain of 3 lb in 24 hours or 5 lbs in one week   isosorbide mononitrate 60 MG 24 hr tablet Commonly known as: IMDUR Take 1.5 tablets (90 mg total) by mouth daily.   nitroGLYCERIN 0.4 MG SL tablet Commonly known as: Nitrostat Place 1 tablet (0.4 mg total) under the tongue every 5 (five) minutes as needed for  chest pain.   pantoprazole 40 MG tablet Commonly known as: PROTONIX Take 1 tablet (40 mg total) by mouth daily.   Praluent 150 MG/ML Soaj Generic drug: Alirocumab Inject 1 mL (150 mg total) into the skin every 14 (fourteen) days.   spironolactone 25 MG tablet Commonly known as: ALDACTONE Take 1 tablet (25 mg total) by mouth daily.   Wegovy 1 MG/0.5ML Soaj Generic drug: Semaglutide-Weight Management Inject 1 mg into the skin once a week.        Discharge Exam: Filed Weights   06/03/23  1705 06/03/23 1823  Weight: 108.9 kg 108.8 kg    Physical Exam: Vitals and nursing note reviewed.  Constitutional:      General: He is not in acute distress. HENT:     Head: Normocephalic and atraumatic.  Cardiovascular:     Rate and Rhythm: Normal rate and regular rhythm.     Heart sounds: Normal heart sounds.  Pulmonary:     Effort: Pulmonary effort is normal.     Breath sounds: Normal breath sounds.  Abdominal:     Palpations: Abdomen is soft.     Tenderness: There is no abdominal tenderness.  Neurological:     Mental Status: Mental status is at baseline.   Condition at discharge: good  The results of significant diagnostics from this hospitalization (including imaging, microbiology, ancillary and laboratory) are listed below for reference.   Imaging Studies: DG Chest 2 View Result Date: 06/03/2023 CLINICAL DATA:  Chest pain. EXAM: CHEST - 2 VIEW COMPARISON:  Radiographs 03/03/2023 and 02/23/2023.  CT 11/06/2022. FINDINGS: The heart size and mediastinal contours are normal. The lungs are clear. There is no pleural effusion or pneumothorax. No acute osseous findings are identified. IMPRESSION: No evidence of acute cardiopulmonary process. Electronically Signed   By: Carey Bullocks M.D.   On: 06/03/2023 17:48    Microbiology: Results for orders placed or performed during the hospital encounter of 09/10/22  C Difficile Quick Screen w PCR reflex     Status: None   Collection Time: 09/11/22 12:30 AM   Specimen: STOOL  Result Value Ref Range Status   C Diff antigen NEGATIVE NEGATIVE Final   C Diff toxin NEGATIVE NEGATIVE Final   C Diff interpretation No C. difficile detected.  Final    Comment: Performed at [facility], [address] [place], [place] [address]  Gastrointestinal Panel by PCR , Stool     Status: None   Collection Time: 09/11/22 12:30 AM   Specimen: Stool  Result Value Ref Range Status   Campylobacter species NOT DETECTED NOT DETECTED Final    Plesimonas shigelloides NOT DETECTED NOT DETECTED Final   Salmonella species NOT DETECTED NOT DETECTED Final   Yersinia enterocolitica NOT DETECTED NOT DETECTED Final   Vibrio species NOT DETECTED NOT DETECTED Final   Vibrio cholerae NOT DETECTED NOT DETECTED Final   Enteroaggregative E coli (EAEC) NOT DETECTED NOT DETECTED Final   Enteropathogenic E coli (EPEC) NOT DETECTED NOT DETECTED Final   Enterotoxigenic E coli (ETEC) NOT DETECTED NOT DETECTED Final   Shiga like toxin producing E coli (STEC) NOT DETECTED NOT DETECTED Final   Shigella/Enteroinvasive E coli (EIEC) NOT DETECTED NOT DETECTED Final   Cryptosporidium NOT DETECTED NOT DETECTED Final   Cyclospora cayetanensis NOT DETECTED NOT DETECTED Final   Entamoeba histolytica NOT DETECTED NOT DETECTED Final   Giardia lamblia NOT DETECTED NOT DETECTED Final   Adenovirus F40/41 NOT DETECTED NOT DETECTED Final   Astrovirus NOT DETECTED NOT  DETECTED Final   Norovirus GI/GII NOT DETECTED NOT DETECTED Final   Rotavirus A NOT DETECTED NOT DETECTED Final   Sapovirus (I, II, IV, and V) NOT DETECTED NOT DETECTED Final    Comment: Performed at [facility], [address] [place], [place] [address]  Calprotectin, Fecal     Status: None   Collection Time: 09/11/22 12:49 AM   Specimen: Colostomy; Stool  Result Value Ref Range Status   Calprotectin, Fecal 64 0 - 120 ug/g Final    Comment: (NOTE) Concentration     Interpretation   Follow-Up < 5 - 50 ug/g     Normal           None >50 -120 ug/g     Borderline       Re-evaluate in 4-6 weeks    >120 ug/g     Abnormal         Repeat as clinically                                   indicated Performed At: [facility] [address] [place], [place] [id number] Jolene Schimke MD ON:[id number]     Labs: CBC: Recent Labs  Lab 06/03/23 1710 06/04/23 1233 06/05/23 0428  WBC 8.6 8.1 6.4  NEUTROABS 5.8  --  3.9  HGB 14.3 14.7 13.3  HCT 42.5 43.3 40.0  MCV 82.5 81.1 83.3   PLT 271 234 200   Basic Metabolic Panel: Recent Labs  Lab 06/03/23 1710 06/04/23 1233 06/05/23 0427 06/05/23 0428  NA 138  --   --  141  K 4.3  --   --  3.9  CL 101  --   --  107  CO2 25  --   --  25  GLUCOSE 92  --   --  103*  BUN 24*  --   --  25*  CREATININE 1.87* 1.92*  --  1.88*  CALCIUM 9.3  --   --  9.1  MG  --   --  2.2  --    Liver Function Tests: No results for input(s): "AST", "ALT", "ALKPHOS", "BILITOT", "PROT", "ALBUMIN" in the last 168 hours. CBG: Recent Labs  Lab 06/04/23 0844 06/04/23 1203 06/04/23 1857 06/05/23 0824 06/05/23 1149  GLUCAP 99 107* 90 106* 110*    Discharge time spent:  37 minutes.  Signed: Loyce Dys, MD [facility] 06/05/2023

## 2023-06-05 NOTE — Progress Notes (Signed)
Progress Note  Patient Name: Clarence [name] Date of Encounter: 06/05/2023  Primary Cardiologist: End Primary Electrophysiologist:  None Advanced Heart Failure: Shirlee Latch  Subjective   No further chest pain.  No dyspnea, palpitations, dizziness, presyncope, or syncope.  Complains of paresthesias involving the plantar aspects of bilateral feet.  Inpatient Medications    Scheduled Meds:  aspirin EC  81 mg Oral Daily   atorvastatin  80 mg Oral QPM   carvedilol  18.75 mg Oral BID WC   clopidogrel  75 mg Oral Daily   empagliflozin  10 mg Oral Daily   ezetimibe  10 mg Oral Daily   fenofibrate  54 mg Oral Daily   heparin  5,000 Units Subcutaneous Q8H   insulin aspart  0-15 Units Subcutaneous TID WC   insulin aspart  0-5 Units Subcutaneous QHS   isosorbide mononitrate  30 mg Oral BID   sacubitril-valsartan  1 tablet Oral BID   spironolactone  25 mg Oral Daily   Continuous Infusions:  PRN Meds: acetaminophen, famotidine, furosemide, nitroGLYCERIN, ondansetron (ZOFRAN) IV   Vital Signs    Vitals:   06/04/23 1800 06/04/23 2045 06/04/23 2146 06/04/23 2212  BP: 114/80  122/86 (!) 131/94  Pulse: 81 94 96 77  Resp: (!) 21 20 18 20   Temp:    98.4 F (36.9 C)  TempSrc:    Oral  SpO2: 99% 100% 100% 97%  Weight:      Height:       No intake or output data in the 24 hours ending 06/05/23 0805 Filed Weights   06/03/23 1705 06/03/23 1823  Weight: 108.9 kg 108.8 kg    Telemetry    Sinus rhythm with a 6 beat run of NSVT - Personally Reviewed  ECG    No new tracings - Personally Reviewed  Physical Exam   GEN: No acute distress.   Neck: No JVD. Cardiac: RRR, no murmurs, rubs, or gallops.  Respiratory: Clear to auscultation bilaterally.  GI: Soft, nontender, non-distended.   MS: No edema; No deformity. Neuro:  Alert and oriented x 3; Nonfocal.  Psych: Normal affect.  Labs    Chemistry Recent Labs  Lab 06/03/23 1710 06/04/23 1233 06/05/23 0428  NA 138  --  141   K 4.3  --  3.9  CL 101  --  107  CO2 25  --  25  GLUCOSE 92  --  103*  BUN 24*  --  25*  CREATININE 1.87* 1.92* 1.88*  CALCIUM 9.3  --  9.1  GFRNONAA 42* 41* 42*  ANIONGAP 12  --  9     Hematology Recent Labs  Lab 06/03/23 1710 06/04/23 1233 06/05/23 0428  WBC 8.6 8.1 6.4  RBC 5.15 5.34 4.80  HGB 14.3 14.7 13.3  HCT 42.5 43.3 40.0  MCV 82.5 81.1 83.3  MCH 27.8 27.5 27.7  MCHC 33.6 33.9 33.3  RDW 13.2 13.3 13.2  PLT 271 234 200    Cardiac EnzymesNo results for input(s): "TROPONINI" in the last 168 hours. No results for input(s): "TROPIPOC" in the last 168 hours.   BNPNo results for input(s): "BNP", "PROBNP" in the last 168 hours.   DDimer No results for input(s): "DDIMER" in the last 168 hours.   Radiology    DG Chest 2 View Result Date: 06/03/2023 IMPRESSION: No evidence of acute cardiopulmonary process. Electronically Signed   By: Carey Bullocks M.D.   On: 06/03/2023 17:48    Cardiac Studies   [facility] 03/02/2023: Conclusions:  Severe two-vessel coronary artery disease with 99% proximal/mid OM1 stenosis and chronic total occlusion of the mid RCA with left-to-right collaterals.  Moderate mid LAD and LCx disease is similar to prior catheterizations. Upper normal left ventricular filling pressure (LVEDP 15 mmHg). Successful PCI to OM1 using Onyx Frontier 2.25 x 34 mm drug-eluting stent with 0% residual stenosis and TIMI-3 flow.   Recommendations: Overnight observation with gentle postcatheterization hydration in the setting of chronic kidney disease and heart failure with mildly reduced ejection fraction. Dual antiplatelet therapy with aspirin and clopidogrel for at least 6 months. Aggressive secondary prevention of coronary artery disease. Continue current antianginal and goal-directed heart failure therapies. __________   LHC 09/29/2022:   Mid RCA lesion is 100% stenosed.   2nd Diag lesion is 40% stenosed.   1st Mrg lesion is 90% stenosed.   Mid LAD lesion is  50% stenosed.   Prox Cx to Mid Cx lesion is 60% stenosed.   1. Normal filling pressures.  2. Preserved cardiac output.  3. Coronaries unchanged from prior study.  Has CTO RCA with collaterals.  There is severe, diffuse disease of OM1 with nonobstructive disease in the LAD and LCx.  Continue medical management.  __________   2D echo 09/11/2022: 1. Left ventricular ejection fraction, by estimation, is 45 to 50%. The  left ventricle has mildly decreased function. The left ventricle  demonstrates regional wall motion abnormalities (hypokinesis of the  inferior and inferolateral wall). There is  moderate left ventricular hypertrophy. Left ventricular diastolic  parameters are consistent with Grade I diastolic dysfunction (impaired  relaxation).   2. Right ventricular systolic function is normal. The right ventricular  size is normal. Tricuspid regurgitation signal is inadequate for assessing  PA pressure.   3. The mitral valve is normal in structure. No evidence of mitral valve  regurgitation. No evidence of mitral stenosis.   4. The aortic valve is tricuspid. Aortic valve regurgitation is not  visualized. No aortic stenosis is present.   5. The inferior vena cava is normal in size with greater than 50%  respiratory variability, suggesting right atrial pressure of 3 mmHg.  __________   Renal artery duplex 10/08/2021: IMPRESSION: 1. Two right-sided renal arteries, single left-sided renal artery. 2. Elevated peak systolic velocity greater than 200 centimeters/second in 1 of the 2 right renal arteries raises possibility of underlying stenosis. CT or MR arteriogram of the abdomen could further evaluate. 3. No evidence of left renal artery stenosis. 4. Normal sonographic appearance of the kidneys without evidence of hydronephrosis. __________   2D echo 10/07/2021: 1. Left ventricular ejection fraction, by estimation, is 35 to 40%. The  left ventricle has moderately decreased function. The  left ventricle  demonstrates global hypokinesis. There is moderate left ventricular  hypertrophy. Left ventricular diastolic  parameters are consistent with Grade I diastolic dysfunction (impaired  relaxation).   2. Right ventricular systolic function is normal. The right ventricular  size is normal.   3. Right atrial size was mildly dilated.   4. The mitral valve is normal in structure. Mild mitral valve  regurgitation. No evidence of mitral stenosis.   5. The aortic valve is tricuspid. Aortic valve regurgitation is not  visualized. No aortic stenosis is present.   6. There is mild dilatation of the ascending aorta, measuring 40 mm. __________   [facility] 08/07/2021: Conclusions: Diffuse moderate-severe, multivessel coronary artery disease, including chronic total occlusion of mid RCA, diffuse plaquing of LAD with up to 60% stenosis in the mid vessel, 60-70%  proximal/mid LCx stenosis, and 90% lesion of OM1. Moderately elevated left heart filling pressures (LVEDP/PCWP 25 mmHg). Severely elevated right heart filling pressures (mean RA 16 mmHg, RVEDP 30 mmHg). Moderate-severe pulmonary hypertension (mean PAP 42 mmHg, PVR 4.0 WU). Moderately reduced Fick cardiac output/index (CO 4.2 L/min, CI 2.0 L/min/m^2).Marland Kitchen   Recommendations: Suspect cardiomyopathy is a combination of ischemic and nonischemic etiologies confounded by significant pulmonary hypertension and right heart failure that may be due to underlying lung disease.  Recommend continued diuresis and optimization of GDMT for acute HFrEF.  In the setting of ongoing wheezing with concern for obstructive lung disease, I will hold carvedilol for now and add Entresto. Secondary prevention of coronary artery disease, including high-intensity statin therapy.  Diffuse nature of CAD is not ideal for percutaneous or surgical revascularization.  Mild troponin elevation on admission is most likely due to demand ischemia in the setting of acute HFrEF and  chronic ischemic heart disease. __________   2D echo 08/05/2021:  1. Left ventricular ejection fraction, by estimation, is 30 to 35%. The  left ventricle has moderately decreased function. The left ventricle  demonstrates global hypokinesis. There is moderate left ventricular  hypertrophy. Left ventricular diastolic  parameters are consistent with Grade II diastolic dysfunction  (pseudonormalization).   2. Right ventricular systolic function is normal. The right ventricular  size is normal. Tricuspid regurgitation signal is inadequate for assessing  PA pressure.   3. Left atrial size was mildly dilated.   4. The mitral valve is abnormal. Mild to moderate mitral valve  regurgitation. No evidence of mitral stenosis.   5. The aortic valve has an indeterminant number of cusps. Aortic valve  regurgitation is not visualized. No aortic stenosis is present.   6. The inferior vena cava is dilated in size with <50% respiratory  variability, suggesting right atrial pressure of 15 mmHg.  Patient Profile     53 y.o. male with history of CAD as a below, HFrEF secondary to mixed ischemic and nonischemic cardiomyopathy, pulmonary hypertension, CKD stage II, Crohn's disease status post colectomy with ostomy, HTN, and HLD who is being seen today for the evaluation of chest pain at the request of Dr. Para March.   Assessment & Plan    1. CAD involving the native coronary arteries with chronic atypical chest pain and mildly elevated high-sensitivity troponin: -Currently without symptoms of angina or cardiac decompensation -Minimal high-sensitivity troponin is flat trending and consistent with prior readings, not consistent with ACS -At time of cardiology consult we discussed repeating cardiac cath versus medication management, with patient preferring to transition some of his pharmacotherapy to the afternoon hours before pursuing repeat cardiac cath  -He has been chest pain-free since admission -Ranolazine  was discontinued at time of cardiology consult due to adverse effects and infrequent patient adherence -Continue current pharmacotherapy including isosorbide mononitrate 30 mg twice daily with further escalation as BP allows and symptoms dictate -Continue carvedilol for antianginal benefit as well -He will otherwise remain on aspirin, clopidogrel, atorvastatin, and ezetimibe -At this time, no plans for further inpatient cardiac testing   2.  HFrEF secondary to mixed ICM and NICM: -Appears euvolemic and well compensated with NYHA class II symptoms -Continue current GDMT including Entresto, spironolactone, carvedilol, and Jardiance -Not requiring a standing loop diuretic -Followed by the advanced heart failure team as an outpatient   3.  HTN: -Blood pressure well-controlled -Continue pharmacotherapy as outlined above   4.  HLD: -LDL 59 -Remains on atorvastatin, ezetimibe, and fenofibrate   5.  CKD stage III with history of hyperkalemia: -Renal function and potassium stable  6.  NSVT: -Asymptomatic -Potassium 3.9 -Obtain magnesium -Remains on carvedilol      For questions or updates, please contact [facility] Please consult [email or web address] for contact info under Cardiology/STEMI.    Signed, Eula Listen, PA-C [facility] Pager: [phone] 06/05/2023, 8:05 AM

## 2023-06-05 NOTE — Plan of Care (Signed)
  Problem: Coping: Goal: Ability to adjust to condition or change in health will improve Outcome: Progressing   Problem: Metabolic: Goal: Ability to maintain appropriate glucose levels will improve Outcome: Progressing   Problem: Nutritional: Goal: Maintenance of adequate nutrition will improve Outcome: Progressing   Problem: Skin Integrity: Goal: Risk for impaired skin integrity will decrease Outcome: Progressing   Problem: Activity: Goal: Ability to tolerate increased activity will improve Outcome: Progressing   Problem: Clinical Measurements: Goal: Ability to maintain clinical measurements within normal limits will improve Outcome: Progressing Goal: Will remain free from infection Outcome: Progressing Goal: Respiratory complications will improve Outcome: Progressing

## 2023-06-06 ENCOUNTER — Emergency Department: Payer: Medicaid Other

## 2023-06-06 ENCOUNTER — Other Ambulatory Visit: Payer: Self-pay

## 2023-06-06 ENCOUNTER — Emergency Department
Admission: EM | Admit: 2023-06-06 | Discharge: 2023-06-06 | Disposition: A | Discharge status: Home / Self Care | Payer: Medicaid Other | Attending: Emergency Medicine | Admitting: Emergency Medicine

## 2023-06-06 DIAGNOSIS — N189 Chronic kidney disease, unspecified: Secondary | ICD-10-CM | POA: Diagnosis not present

## 2023-06-06 DIAGNOSIS — R1084 Generalized abdominal pain: Secondary | ICD-10-CM | POA: Diagnosis not present

## 2023-06-06 DIAGNOSIS — I129 Hypertensive chronic kidney disease with stage 1 through stage 4 chronic kidney disease, or unspecified chronic kidney disease: Secondary | ICD-10-CM | POA: Diagnosis not present

## 2023-06-06 DIAGNOSIS — Z9049 Acquired absence of other specified parts of digestive tract: Secondary | ICD-10-CM | POA: Diagnosis not present

## 2023-06-06 DIAGNOSIS — R197 Diarrhea, unspecified: Secondary | ICD-10-CM | POA: Insufficient documentation

## 2023-06-06 DIAGNOSIS — R110 Nausea: Secondary | ICD-10-CM | POA: Insufficient documentation

## 2023-06-06 DIAGNOSIS — I2510 Atherosclerotic heart disease of native coronary artery without angina pectoris: Secondary | ICD-10-CM | POA: Diagnosis not present

## 2023-06-06 DIAGNOSIS — I10 Essential (primary) hypertension: Secondary | ICD-10-CM | POA: Diagnosis not present

## 2023-06-06 DIAGNOSIS — R0789 Other chest pain: Secondary | ICD-10-CM | POA: Diagnosis not present

## 2023-06-06 DIAGNOSIS — K828 Other specified diseases of gallbladder: Secondary | ICD-10-CM | POA: Diagnosis not present

## 2023-06-06 DIAGNOSIS — R140 Abdominal distension (gaseous): Secondary | ICD-10-CM | POA: Diagnosis not present

## 2023-06-06 LAB — CBC WITH DIFFERENTIAL/PLATELET
Abs Immature Granulocytes: 0.02 10*3/uL (ref 0.00–0.07)
Basophils Absolute: 0 10*3/uL (ref 0.0–0.1)
Basophils Relative: 0 %
Eosinophils Absolute: 0.1 10*3/uL (ref 0.0–0.5)
Eosinophils Relative: 1 %
HCT: 48.5 % (ref 39.0–52.0)
Hemoglobin: 16.2 g/dL (ref 13.0–17.0)
Immature Granulocytes: 0 %
Lymphocytes Relative: 18 %
Lymphs Abs: 1.6 10*3/uL (ref 0.7–4.0)
MCH: 27.9 pg (ref 26.0–34.0)
MCHC: 33.4 g/dL (ref 30.0–36.0)
MCV: 83.5 fL (ref 80.0–100.0)
Monocytes Absolute: 0.8 10*3/uL (ref 0.1–1.0)
Monocytes Relative: 8 %
Neutro Abs: 6.6 10*3/uL (ref 1.7–7.7)
Neutrophils Relative %: 73 %
Platelets: 275 10*3/uL (ref 150–400)
RBC: 5.81 MIL/uL (ref 4.22–5.81)
RDW: 13.2 % (ref 11.5–15.5)
WBC: 9.1 10*3/uL (ref 4.0–10.5)
nRBC: 0 % (ref 0.0–0.2)

## 2023-06-06 LAB — URINALYSIS, ROUTINE W REFLEX MICROSCOPIC
Bilirubin Urine: NEGATIVE
Glucose, UA: >=500 mg/dL — AB
Hgb urine dipstick: NEGATIVE
Ketones, ur: NEGATIVE mg/dL
Leukocytes,Ua: NEGATIVE
Nitrite: NEGATIVE
Protein, ur: NEGATIVE mg/dL
RBC / HPF: 0 RBC/hpf (ref 0–5)
Specific Gravity, Urine: 1.015 (ref 1.005–1.030)
Squamous Epithelial / HPF: 0 /[HPF] (ref 0–5)
pH: 5 (ref 5.0–8.0)

## 2023-06-06 LAB — COMPREHENSIVE METABOLIC PANEL
ALT: 30 U/L (ref 0–44)
AST: 24 U/L (ref 15–41)
Albumin: 4.7 g/dL (ref 3.5–5.0)
Alkaline Phosphatase: 61 U/L (ref 38–126)
Anion gap: 10 (ref 5–15)
BUN: 31 mg/dL — ABNORMAL HIGH (ref 6–20)
CO2: 23 mmol/L (ref 22–32)
Calcium: 10.2 mg/dL (ref 8.9–10.3)
Chloride: 106 mmol/L (ref 98–111)
Creatinine, Ser: 2 mg/dL — ABNORMAL HIGH (ref 0.61–1.24)
GFR, Estimated: 39 mL/min — ABNORMAL LOW (ref >60)
Glucose, Bld: 118 mg/dL — ABNORMAL HIGH (ref 70–99)
Potassium: 5.3 mmol/L — ABNORMAL HIGH (ref 3.5–5.1)
Sodium: 139 mmol/L (ref 135–145)
Total Bilirubin: 0.8 mg/dL (ref 0.0–1.2)
Total Protein: 8.5 g/dL — ABNORMAL HIGH (ref 6.5–8.1)

## 2023-06-06 LAB — LIPASE, BLOOD: Lipase: 55 U/L — ABNORMAL HIGH (ref 11–51)

## 2023-06-06 LAB — TROPONIN I (HIGH SENSITIVITY): Troponin I (High Sensitivity): 26 ng/L — ABNORMAL HIGH (ref <18)

## 2023-06-06 MED ORDER — IOHEXOL 300 MG/ML  SOLN
80.0000 mL | Freq: Once | INTRAMUSCULAR | Status: AC | PRN
  Administered 2023-06-06: 80 mL via INTRAVENOUS

## 2023-06-06 MED ORDER — METOCLOPRAMIDE HCL 10 MG PO TABS
10.0000 mg | ORAL_TABLET | Freq: Once | ORAL | Status: AC
  Administered 2023-06-06: 10 mg via ORAL
  Filled 2023-06-06: qty 1

## 2023-06-06 MED ORDER — SODIUM CHLORIDE 0.9 % IV BOLUS
500.0000 mL | Freq: Once | INTRAVENOUS | Status: AC
  Administered 2023-06-06: 500 mL via INTRAVENOUS

## 2023-06-06 MED ORDER — METOCLOPRAMIDE HCL 10 MG PO TABS: 10.0000 mg | ORAL_TABLET | Freq: Three times a day (TID) | ORAL | 0 refills | Status: DC | PRN

## 2023-06-06 MED ORDER — ONDANSETRON HCL 4 MG/2ML IJ SOLN
4.0000 mg | Freq: Once | INTRAMUSCULAR | Status: AC
  Administered 2023-06-06: 4 mg via INTRAVENOUS
  Filled 2023-06-06: qty 2

## 2023-06-06 NOTE — ED Provider Notes (Signed)
[facility] Provider Note    Event Date/Time   First MD Initiated Contact with Patient 06/06/23 1515     (approximate)   History   Abdominal Pain and Diarrhea   HPI  Clarence [name] is a 53 y.o. 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  He reports associated nausea but no vomiting.  He has a sensation of fullness and distention in his abdomen but no acute pain.  He states he has an internal hernia and has had blockages before.  He denies any fever or chills.  He was just hospitalized for chest pain but states that this has resolved.  The past medical records.  The patient was just admitted from 1/16 until yesterday for high risk chest pain.  Troponins were stable and the patient did not require a cardiac intervention.   Physical Exam   Triage Vital Signs: ED Triage Vitals  Encounter Vitals Group     BP 06/06/23 1335 123/86     Systolic BP Percentile --      Diastolic BP Percentile --      Pulse Rate 06/06/23 1335 97     Resp 06/06/23 1335 15     Temp 06/06/23 1335 97.8 F (36.6 C)     Temp Source 06/06/23 1335 Oral     SpO2 06/06/23 1335 97 %     Weight 06/06/23 1336 239 lb 13.8 oz (108.8 kg)     Height 06/06/23 1336 5\' 10"  (1.778 m)     Head Circumference --      Peak Flow --      Pain Score 06/06/23 1335 1     Pain Loc --      Pain Education --      Exclude from Growth Chart --     Most recent vital signs: Vitals:   06/06/23 1335 06/06/23 1828  BP: 123/86 123/85  Pulse: 97 82  Resp: 15 16  Temp: 97.8 F (36.6 C) 97.6 F (36.4 C)  SpO2: 97% 97%     General: Awake, no distress.  CV:  Good peripheral perfusion.  Resp:  Normal effort.  Abd:  Soft with mild diffuse discomfort but no focal tenderness.  Mild distention.  Other:  No jaundice or scleral  icterus.  Somewhat dry mucous membranes.   ED Results / Procedures / Treatments   Labs (all labs ordered are listed, but only abnormal results are displayed) Labs Reviewed  COMPREHENSIVE METABOLIC PANEL - Abnormal; Notable for the following components:      Result Value   Potassium 5.3 (*)    Glucose, Bld 118 (*)    BUN 31 (*)    Creatinine, Ser 2.00 (*)    Total Protein 8.5 (*)    GFR, Estimated 39 (*)    All other components within normal limits  URINALYSIS, ROUTINE W REFLEX MICROSCOPIC - Abnormal; Notable for the following components:   Color, Urine YELLOW (*)    APPearance CLEAR (*)    Glucose, UA >=500 (*)    Bacteria, UA RARE (*)    All other components within normal limits  LIPASE, BLOOD - Abnormal; Notable for the following components:   Lipase 55 (*)    All other components within normal limits  TROPONIN I (HIGH SENSITIVITY) - Abnormal; Notable for the following components:   Troponin I (  High Sensitivity) 26 (*)    All other components within normal limits  CBC WITH DIFFERENTIAL/PLATELET     EKG  ED ECG REPORT I, Dionne Bucy, the attending physician, personally viewed and interpreted this ECG.  Date: 06/06/2023 EKG Time: 1615 Rate: 77 Rhythm: normal sinus rhythm QRS Axis: normal Intervals: normal ST/T Wave abnormalities: Nonspecific ST abnormalities  Narrative Interpretation: Nonspecific abnormalities with no evidence of acute ischemia    RADIOLOGY  CT abdomen/pelvis: I independently viewed and interpreted the images; there are no dilated bowel loops or any free air or free fluid.  Radiology report indicates the following:  IMPRESSION:  1. Moderately distended stomach with ingested material.  2. Submucosal fat in the duodenum may reflect chronic changes of  inflammatory bowel disease. No active inflammation.    PROCEDURES:  Critical Care performed: No  Procedures   MEDICATIONS ORDERED IN ED: Medications  metoCLOPramide (REGLAN)  tablet 10 mg (has no administration in time range)  sodium chloride 0.9 % bolus 500 mL (0 mLs Intravenous Stopped 06/06/23 1829)  ondansetron (ZOFRAN) injection 4 mg (4 mg Intravenous Given 06/06/23 1605)  iohexol (OMNIPAQUE) 300 MG/ML solution 80 mL (80 mLs Intravenous Contrast Given 06/06/23 1721)     IMPRESSION / MDM / ASSESSMENT AND PLAN / ED COURSE  I reviewed the triage vital signs and the nursing notes.  53 year old male with PMH as noted above presents with abdominal discomfort, distention, loose watery output from his colostomy and nausea since last night.  On exam he does not have any focal tenderness or peritoneal signs.  Vital signs are normal.  He does appear somewhat dehydrated.  CMP is significant for mild worsening of his creatinine and GFR as well as borderline elevated potassium.  Lipase is also borderline elevated.  CBC shows no leukocytosis.  Differential diagnosis includes, but is not limited to, viral gastroenteritis with dehydration, gastritis, gastroparesis, colitis, diverticulitis, Crohn's exacerbation, less likely bowel obstruction.  We will obtain CT for further evaluation and give a fluid bolus.  Patient's presentation is most consistent with acute presentation with potential threat to life or bodily function.  ----------------------------------------- 4:20 PM on 06/06/2023 -----------------------------------------  EKG shows no acute findings.  The machine read is "acute MI/STEMI" and inferior infarct.  However, on a close examination of this EKG, there are actually no ST elevations other than an approximately 1 mm elevation in lead III.  The morphology is identical to his most recent prior EKG from 1/17.  The patient is not having any chest pain at this time.  There is no clinical ACS.  There is no indication for STEMI activation or motion cardiology consultation.  I have added on a troponin to the labs.  ----------------------------------------- 6:42 PM on  06/06/2023 -----------------------------------------  Troponin is 26 which is trending down from the patient's recent admission.  The patient continues to have no chest pain.  There is no indication for serial troponins or further cardiac workup.  CT shows a slightly distended stomach but no other acute findings.  Overall I suspect gastroenteritis, likely with some element of gastroparesis.  The patient is tolerating p.o. and has no active pain or vomiting at this time.  He feels well and is comfortable going home.  He is stable for discharge at this time.  I counseled him on the results of the workup.  I have prescribed p.o. Reglan to help with his symptoms.  I gave strict return precautions and he expresses understanding.   FINAL CLINICAL IMPRESSION(S) / ED DIAGNOSES  Final diagnoses:  Diarrhea, unspecified type     Rx / DC Orders   ED Discharge Orders          Ordered    metoCLOPramide (REGLAN) 10 MG tablet  Every 8 hours PRN        06/06/23 1841             Note:  This document was prepared using Dragon voice recognition software and may include unintentional dictation errors.    Dionne Bucy, MD 06/06/23 1843

## 2023-06-06 NOTE — Discharge Instructions (Signed)
You likely have a gastrointestinal virus.  Make sure you are drinking plenty of fluids.  Follow-up with your primary care provider.  Return to the ER for new, worsening, or persistent severe loose stool, blood in the colostomy output, abdominal pain, vomiting, fever, or any other new or worsening symptoms that concern you.

## 2023-06-06 NOTE — ED Triage Notes (Addendum)
Pt says he had dinner at 8:30pm last night and he still feels full. He also reports diarrhea since 9:30pm last night. Pt reports hx of crohn's but does not take medication. Pt says, "I feel dehydrated and dizzy." Pt says he was here visiting his mom inpatient and decided to come get checked out.

## 2023-06-07 ENCOUNTER — Telehealth: Payer: Self-pay

## 2023-06-07 NOTE — Transitions of Care (Post Inpatient/ED Visit) (Signed)
   06/07/2023  Name: Clarence [name] MRN: [id number] DOB: 06-Jun-1970  Today's TOC FU Call Status: Today's TOC FU Call Status:: Unsuccessful Call (1st Attempt) Unsuccessful Call (1st Attempt) Date: 06/07/23  Attempted to reach the patient regarding the most recent Inpatient/ED visit. Called patient and no answer. Voicemail was left with office call back number.    Follow Up Plan: Additional outreach attempts will be made to reach the patient to complete the Transitions of Care (Post Inpatient/ED visit) call.   Signature : Justinian Miano.D/RMA

## 2023-06-11 NOTE — Transitions of Care (Post Inpatient/ED Visit) (Signed)
   06/11/2023  Name: Clarence [name] MRN: [id number] DOB: 1970-08-22  Today's TOC FU Call Status: Today's TOC FU Call Status:: Unsuccessful Call (2nd Attempt) Unsuccessful Call (1st Attempt) Date: 06/07/23 Unsuccessful Call (2nd Attempt) Date: 06/11/23  Attempted to reach the patient regarding the most recent Inpatient/ED visit.  Follow Up Plan: Additional outreach attempts will be made to reach the patient to complete the Transitions of Care (Post Inpatient/ED visit) call.   Signature : Mareta Chesnut.D/RMA

## 2023-06-12 ENCOUNTER — Emergency Department: Payer: Medicaid Other

## 2023-06-12 ENCOUNTER — Other Ambulatory Visit: Payer: Self-pay

## 2023-06-12 ENCOUNTER — Inpatient Hospital Stay
Admission: EM | Admit: 2023-06-12 | Discharge: 2023-06-15 | DRG: 682 | Disposition: A | Discharge status: Home / Self Care | Payer: Medicaid Other | Attending: Family Medicine | Admitting: Family Medicine

## 2023-06-12 DIAGNOSIS — Z933 Colostomy status: Secondary | ICD-10-CM

## 2023-06-12 DIAGNOSIS — K5090 Crohn's disease, unspecified, without complications: Secondary | ICD-10-CM | POA: Diagnosis present

## 2023-06-12 DIAGNOSIS — R0789 Other chest pain: Secondary | ICD-10-CM | POA: Diagnosis not present

## 2023-06-12 DIAGNOSIS — R7982 Elevated C-reactive protein (CRP): Secondary | ICD-10-CM | POA: Diagnosis not present

## 2023-06-12 DIAGNOSIS — Z87891 Personal history of nicotine dependence: Secondary | ICD-10-CM

## 2023-06-12 DIAGNOSIS — Z883 Allergy status to other anti-infective agents status: Secondary | ICD-10-CM

## 2023-06-12 DIAGNOSIS — I428 Other cardiomyopathies: Secondary | ICD-10-CM | POA: Diagnosis present

## 2023-06-12 DIAGNOSIS — M7732 Calcaneal spur, left foot: Secondary | ICD-10-CM | POA: Diagnosis not present

## 2023-06-12 DIAGNOSIS — K828 Other specified diseases of gallbladder: Secondary | ICD-10-CM | POA: Diagnosis not present

## 2023-06-12 DIAGNOSIS — R1110 Vomiting, unspecified: Secondary | ICD-10-CM

## 2023-06-12 DIAGNOSIS — N1831 Chronic kidney disease, stage 3a: Secondary | ICD-10-CM | POA: Diagnosis present

## 2023-06-12 DIAGNOSIS — Z9889 Other specified postprocedural states: Secondary | ICD-10-CM

## 2023-06-12 DIAGNOSIS — I5042 Chronic combined systolic (congestive) and diastolic (congestive) heart failure: Secondary | ICD-10-CM

## 2023-06-12 DIAGNOSIS — I130 Hypertensive heart and chronic kidney disease with heart failure and stage 1 through stage 4 chronic kidney disease, or unspecified chronic kidney disease: Secondary | ICD-10-CM | POA: Diagnosis present

## 2023-06-12 DIAGNOSIS — R918 Other nonspecific abnormal finding of lung field: Secondary | ICD-10-CM | POA: Diagnosis not present

## 2023-06-12 DIAGNOSIS — R0989 Other specified symptoms and signs involving the circulatory and respiratory systems: Secondary | ICD-10-CM | POA: Diagnosis not present

## 2023-06-12 DIAGNOSIS — Z9049 Acquired absence of other specified parts of digestive tract: Secondary | ICD-10-CM | POA: Diagnosis not present

## 2023-06-12 DIAGNOSIS — I9589 Other hypotension: Secondary | ICD-10-CM | POA: Diagnosis present

## 2023-06-12 DIAGNOSIS — Y92239 Unspecified place in hospital as the place of occurrence of the external cause: Secondary | ICD-10-CM | POA: Diagnosis not present

## 2023-06-12 DIAGNOSIS — E860 Dehydration: Secondary | ICD-10-CM | POA: Diagnosis present

## 2023-06-12 DIAGNOSIS — I5022 Chronic systolic (congestive) heart failure: Secondary | ICD-10-CM | POA: Diagnosis not present

## 2023-06-12 DIAGNOSIS — I509 Heart failure, unspecified: Secondary | ICD-10-CM | POA: Diagnosis not present

## 2023-06-12 DIAGNOSIS — Z881 Allergy status to other antibiotic agents status: Secondary | ICD-10-CM

## 2023-06-12 DIAGNOSIS — Z7982 Long term (current) use of aspirin: Secondary | ICD-10-CM

## 2023-06-12 DIAGNOSIS — I2721 Secondary pulmonary arterial hypertension: Secondary | ICD-10-CM | POA: Diagnosis not present

## 2023-06-12 DIAGNOSIS — E875 Hyperkalemia: Secondary | ICD-10-CM | POA: Diagnosis not present

## 2023-06-12 DIAGNOSIS — A084 Viral intestinal infection, unspecified: Secondary | ICD-10-CM | POA: Diagnosis present

## 2023-06-12 DIAGNOSIS — Z888 Allergy status to other drugs, medicaments and biological substances status: Secondary | ICD-10-CM

## 2023-06-12 DIAGNOSIS — Z809 Family history of malignant neoplasm, unspecified: Secondary | ICD-10-CM

## 2023-06-12 DIAGNOSIS — I517 Cardiomegaly: Secondary | ICD-10-CM | POA: Diagnosis not present

## 2023-06-12 DIAGNOSIS — I255 Ischemic cardiomyopathy: Secondary | ICD-10-CM | POA: Diagnosis not present

## 2023-06-12 DIAGNOSIS — L270 Generalized skin eruption due to drugs and medicaments taken internally: Secondary | ICD-10-CM | POA: Diagnosis not present

## 2023-06-12 DIAGNOSIS — I77810 Thoracic aortic ectasia: Secondary | ICD-10-CM | POA: Diagnosis not present

## 2023-06-12 DIAGNOSIS — M25572 Pain in left ankle and joints of left foot: Secondary | ICD-10-CM | POA: Diagnosis not present

## 2023-06-12 DIAGNOSIS — Z7985 Long-term (current) use of injectable non-insulin antidiabetic drugs: Secondary | ICD-10-CM

## 2023-06-12 DIAGNOSIS — M79672 Pain in left foot: Secondary | ICD-10-CM | POA: Diagnosis not present

## 2023-06-12 DIAGNOSIS — R700 Elevated erythrocyte sedimentation rate: Secondary | ICD-10-CM | POA: Diagnosis present

## 2023-06-12 DIAGNOSIS — Z882 Allergy status to sulfonamides status: Secondary | ICD-10-CM

## 2023-06-12 DIAGNOSIS — T361X5A Adverse effect of cephalosporins and other beta-lactam antibiotics, initial encounter: Secondary | ICD-10-CM | POA: Diagnosis not present

## 2023-06-12 DIAGNOSIS — Z955 Presence of coronary angioplasty implant and graft: Secondary | ICD-10-CM

## 2023-06-12 DIAGNOSIS — R112 Nausea with vomiting, unspecified: Secondary | ICD-10-CM | POA: Diagnosis not present

## 2023-06-12 DIAGNOSIS — Z8249 Family history of ischemic heart disease and other diseases of the circulatory system: Secondary | ICD-10-CM

## 2023-06-12 DIAGNOSIS — Z79899 Other long term (current) drug therapy: Secondary | ICD-10-CM

## 2023-06-12 DIAGNOSIS — M7730 Calcaneal spur, unspecified foot: Secondary | ICD-10-CM | POA: Diagnosis present

## 2023-06-12 DIAGNOSIS — K529 Noninfective gastroenteritis and colitis, unspecified: Secondary | ICD-10-CM | POA: Insufficient documentation

## 2023-06-12 DIAGNOSIS — N179 Acute kidney failure, unspecified: Secondary | ICD-10-CM | POA: Diagnosis not present

## 2023-06-12 DIAGNOSIS — R109 Unspecified abdominal pain: Secondary | ICD-10-CM | POA: Diagnosis not present

## 2023-06-12 DIAGNOSIS — I10 Essential (primary) hypertension: Secondary | ICD-10-CM | POA: Diagnosis not present

## 2023-06-12 DIAGNOSIS — I25110 Atherosclerotic heart disease of native coronary artery with unstable angina pectoris: Secondary | ICD-10-CM | POA: Diagnosis not present

## 2023-06-12 DIAGNOSIS — Z7902 Long term (current) use of antithrombotics/antiplatelets: Secondary | ICD-10-CM

## 2023-06-12 DIAGNOSIS — Z886 Allergy status to analgesic agent status: Secondary | ICD-10-CM

## 2023-06-12 DIAGNOSIS — J690 Pneumonitis due to inhalation of food and vomit: Secondary | ICD-10-CM | POA: Diagnosis present

## 2023-06-12 DIAGNOSIS — Z800 Family history of malignant neoplasm of digestive organs: Secondary | ICD-10-CM

## 2023-06-12 DIAGNOSIS — Z7984 Long term (current) use of oral hypoglycemic drugs: Secondary | ICD-10-CM

## 2023-06-12 DIAGNOSIS — R197 Diarrhea, unspecified: Secondary | ICD-10-CM | POA: Diagnosis not present

## 2023-06-12 LAB — RESP PANEL BY RT-PCR (RSV, FLU A&B, COVID)  RVPGX2
Influenza A by PCR: NEGATIVE
Influenza B by PCR: NEGATIVE
Resp Syncytial Virus by PCR: NEGATIVE
SARS Coronavirus 2 by RT PCR: NEGATIVE

## 2023-06-12 LAB — TROPONIN I (HIGH SENSITIVITY): Troponin I (High Sensitivity): 22 ng/L — ABNORMAL HIGH (ref <18)

## 2023-06-12 LAB — GASTROINTESTINAL PANEL BY PCR, STOOL (REPLACES STOOL CULTURE)

## 2023-06-12 LAB — LACTIC ACID, PLASMA
Lactic Acid, Venous: 0.6 mmol/L (ref 0.5–1.9)
Lactic Acid, Venous: 0.8 mmol/L (ref 0.5–1.9)

## 2023-06-12 LAB — COMPREHENSIVE METABOLIC PANEL
ALT: 28 U/L (ref 0–44)
AST: 20 U/L (ref 15–41)
Albumin: 4.3 g/dL (ref 3.5–5.0)
Alkaline Phosphatase: 58 U/L (ref 38–126)
Anion gap: 10 (ref 5–15)
BUN: 56 mg/dL — ABNORMAL HIGH (ref 6–20)
CO2: 19 mmol/L — ABNORMAL LOW (ref 22–32)
Calcium: 9.7 mg/dL (ref 8.9–10.3)
Chloride: 104 mmol/L (ref 98–111)
Creatinine, Ser: 4.26 mg/dL — ABNORMAL HIGH (ref 0.61–1.24)
GFR, Estimated: 16 mL/min — ABNORMAL LOW (ref >60)
Glucose, Bld: 129 mg/dL — ABNORMAL HIGH (ref 70–99)
Potassium: 5.7 mmol/L — ABNORMAL HIGH (ref 3.5–5.1)
Sodium: 133 mmol/L — ABNORMAL LOW (ref 135–145)
Total Bilirubin: 0.6 mg/dL (ref 0.0–1.2)
Total Protein: 8.6 g/dL — ABNORMAL HIGH (ref 6.5–8.1)

## 2023-06-12 LAB — BASIC METABOLIC PANEL
Anion gap: 11 (ref 5–15)
BUN: 53 mg/dL — ABNORMAL HIGH (ref 6–20)
CO2: 13 mmol/L — ABNORMAL LOW (ref 22–32)
Calcium: 8.5 mg/dL — ABNORMAL LOW (ref 8.9–10.3)
Chloride: 106 mmol/L (ref 98–111)
Creatinine, Ser: 3.29 mg/dL — ABNORMAL HIGH (ref 0.61–1.24)
GFR, Estimated: 21 mL/min — ABNORMAL LOW (ref >60)
Glucose, Bld: 149 mg/dL — ABNORMAL HIGH (ref 70–99)
Potassium: 5.1 mmol/L (ref 3.5–5.1)
Sodium: 130 mmol/L — ABNORMAL LOW (ref 135–145)

## 2023-06-12 LAB — URINALYSIS, W/ REFLEX TO CULTURE (INFECTION SUSPECTED)
Bacteria, UA: NONE SEEN
Bilirubin Urine: NEGATIVE
Glucose, UA: 50 mg/dL — AB
Hgb urine dipstick: NEGATIVE
Ketones, ur: NEGATIVE mg/dL
Leukocytes,Ua: NEGATIVE
Nitrite: NEGATIVE
Protein, ur: NEGATIVE mg/dL
Specific Gravity, Urine: 1.024 (ref 1.005–1.030)
pH: 5 (ref 5.0–8.0)

## 2023-06-12 LAB — CBC
HCT: 46.7 % (ref 39.0–52.0)
Hemoglobin: 15.9 g/dL (ref 13.0–17.0)
MCH: 27.7 pg (ref 26.0–34.0)
MCHC: 34 g/dL (ref 30.0–36.0)
MCV: 81.2 fL (ref 80.0–100.0)
Platelets: 302 10*3/uL (ref 150–400)
RBC: 5.75 MIL/uL (ref 4.22–5.81)
RDW: 13.7 % (ref 11.5–15.5)
WBC: 13.5 10*3/uL — ABNORMAL HIGH (ref 4.0–10.5)
nRBC: 0 % (ref 0.0–0.2)

## 2023-06-12 LAB — LIPASE, BLOOD: Lipase: 56 U/L — ABNORMAL HIGH (ref 11–51)

## 2023-06-12 LAB — URINALYSIS, ROUTINE W REFLEX MICROSCOPIC
Bilirubin Urine: NEGATIVE
Glucose, UA: 50 mg/dL — AB
Hgb urine dipstick: NEGATIVE
Ketones, ur: NEGATIVE mg/dL
Leukocytes,Ua: NEGATIVE
Nitrite: NEGATIVE
Protein, ur: NEGATIVE mg/dL
Specific Gravity, Urine: 1.023 (ref 1.005–1.030)
pH: 5 (ref 5.0–8.0)

## 2023-06-12 LAB — SEDIMENTATION RATE: Sed Rate: 23 mm/h — ABNORMAL HIGH (ref 0–20)

## 2023-06-12 MED ORDER — PANTOPRAZOLE SODIUM 40 MG IV SOLR
40.0000 mg | INTRAVENOUS | Status: DC
  Administered 2023-06-12 – 2023-06-14 (×3): 40 mg via INTRAVENOUS
  Filled 2023-06-12 (×3): qty 10

## 2023-06-12 MED ORDER — ACETAMINOPHEN 325 MG PO TABS
650.0000 mg | ORAL_TABLET | Freq: Every day | ORAL | Status: DC
  Administered 2023-06-12 – 2023-06-14 (×3): 650 mg via ORAL
  Filled 2023-06-12 (×3): qty 2

## 2023-06-12 MED ORDER — SODIUM CHLORIDE 0.9 % IV BOLUS
500.0000 mL | Freq: Once | INTRAVENOUS | Status: AC
  Administered 2023-06-12: 500 mL via INTRAVENOUS

## 2023-06-12 MED ORDER — DIPHENHYDRAMINE HCL 25 MG PO CAPS: 25.0000 mg | ORAL_CAPSULE | Freq: Four times a day (QID) | ORAL | Status: DC | PRN

## 2023-06-12 MED ORDER — SODIUM CHLORIDE 0.9 % IV SOLN
500.0000 mg | Freq: Once | INTRAVENOUS | Status: DC
  Filled 2023-06-12: qty 5

## 2023-06-12 MED ORDER — NITROGLYCERIN 0.4 MG SL SUBL
0.4000 mg | SUBLINGUAL_TABLET | SUBLINGUAL | Status: DC | PRN
  Administered 2023-06-12 – 2023-06-13 (×2): 0.4 mg via SUBLINGUAL
  Filled 2023-06-12: qty 1

## 2023-06-12 MED ORDER — HEPARIN SODIUM (PORCINE) 5000 UNIT/ML IJ SOLN
5000.0000 [IU] | Freq: Two times a day (BID) | INTRAMUSCULAR | Status: DC
  Administered 2023-06-12 – 2023-06-15 (×6): 5000 [IU] via SUBCUTANEOUS
  Filled 2023-06-12 (×6): qty 1

## 2023-06-12 MED ORDER — HYDRALAZINE HCL 20 MG/ML IJ SOLN: 5.0000 mg | Freq: Four times a day (QID) | INTRAMUSCULAR | Status: DC | PRN

## 2023-06-12 MED ORDER — BENZONATATE 100 MG PO CAPS: 100.0000 mg | ORAL_CAPSULE | Freq: Three times a day (TID) | ORAL | Status: DC | PRN

## 2023-06-12 MED ORDER — IPRATROPIUM-ALBUTEROL 0.5-2.5 (3) MG/3ML IN SOLN: 3.0000 mL | Freq: Four times a day (QID) | RESPIRATORY_TRACT | Status: DC | PRN

## 2023-06-12 MED ORDER — FENOFIBRATE 54 MG PO TABS
54.0000 mg | ORAL_TABLET | Freq: Every day | ORAL | Status: DC
  Administered 2023-06-13 – 2023-06-15 (×3): 54 mg via ORAL
  Filled 2023-06-12 (×3): qty 1

## 2023-06-12 MED ORDER — HYDROMORPHONE HCL 1 MG/ML IJ SOLN
0.5000 mg | INTRAMUSCULAR | Status: DC | PRN
  Administered 2023-06-13: 1 mg via INTRAVENOUS
  Filled 2023-06-12: qty 1

## 2023-06-12 MED ORDER — ONDANSETRON HCL 4 MG/2ML IJ SOLN
4.0000 mg | Freq: Once | INTRAMUSCULAR | Status: AC
  Administered 2023-06-12: 4 mg via INTRAVENOUS
  Filled 2023-06-12: qty 2

## 2023-06-12 MED ORDER — CLOPIDOGREL BISULFATE 75 MG PO TABS
75.0000 mg | ORAL_TABLET | Freq: Every day | ORAL | Status: DC
  Administered 2023-06-12 – 2023-06-15 (×4): 75 mg via ORAL
  Filled 2023-06-12 (×4): qty 1

## 2023-06-12 MED ORDER — SODIUM CHLORIDE 0.9 % IV SOLN: INTRAVENOUS | Status: DC

## 2023-06-12 MED ORDER — SODIUM CHLORIDE 0.9 % IV SOLN
2.0000 g | Freq: Once | INTRAVENOUS | Status: AC
  Administered 2023-06-12: 2 g via INTRAVENOUS
  Filled 2023-06-12: qty 20

## 2023-06-12 MED ORDER — ASPIRIN 81 MG PO TBEC
81.0000 mg | DELAYED_RELEASE_TABLET | Freq: Every day | ORAL | Status: DC
  Administered 2023-06-12 – 2023-06-15 (×4): 81 mg via ORAL
  Filled 2023-06-12 (×4): qty 1

## 2023-06-12 MED ORDER — RISAQUAD PO CAPS
2.0000 | ORAL_CAPSULE | Freq: Three times a day (TID) | ORAL | Status: DC
  Administered 2023-06-12 – 2023-06-15 (×9): 2 via ORAL
  Filled 2023-06-12 (×10): qty 2

## 2023-06-12 MED ORDER — DIPHENHYDRAMINE HCL 50 MG/ML IJ SOLN
25.0000 mg | Freq: Once | INTRAMUSCULAR | Status: AC
  Administered 2023-06-12: 25 mg via INTRAVENOUS
  Filled 2023-06-12: qty 1

## 2023-06-12 MED ORDER — METHYLPREDNISOLONE SODIUM SUCC 40 MG IJ SOLR
40.0000 mg | Freq: Once | INTRAMUSCULAR | Status: AC
  Administered 2023-06-12: 40 mg via INTRAVENOUS
  Filled 2023-06-12: qty 1

## 2023-06-12 MED ORDER — ATORVASTATIN CALCIUM 20 MG PO TABS
80.0000 mg | ORAL_TABLET | Freq: Every evening | ORAL | Status: DC
  Administered 2023-06-12 – 2023-06-14 (×3): 80 mg via ORAL
  Filled 2023-06-12 (×4): qty 4

## 2023-06-12 MED ORDER — SODIUM ZIRCONIUM CYCLOSILICATE 10 G PO PACK
10.0000 g | PACK | Freq: Once | ORAL | Status: AC
  Administered 2023-06-12: 10 g via ORAL
  Filled 2023-06-12: qty 1

## 2023-06-12 MED ORDER — EZETIMIBE 10 MG PO TABS
10.0000 mg | ORAL_TABLET | Freq: Every day | ORAL | Status: DC
  Administered 2023-06-12 – 2023-06-15 (×4): 10 mg via ORAL
  Filled 2023-06-12 (×4): qty 1

## 2023-06-12 NOTE — H&P (Addendum)
History and Physical    Clarence [name] EAV:[id number] DOB: 03-12-1971 DOA: 06/12/2023  PCP: Caesar Bookman, NP (Confirm with patient/family/NH records and if not entered, this has to be entered at [facility] point of entry) Patient coming from: Home  I have personally briefly reviewed patient's old medical records in [facility]  Chief Complaint: Worsening of nauseous vomiting and increasing colostomy output  HPI: Clarence [name] is a 53 y.o. male with medical history significant of HTN, CKD 3A, Crohn's disease status post partial colectomy and colostomy, HTN, CAD with ischemic cardiomyopathy, chronic HFrEF with LVEF 45-50%, presented with worsening of nauseous vomiting increase of colostomy output for 7 days.  Symptoms started 7 days ago, patient started develop increasing output from colostomy bag and started to feel lightheadedness and dehydration despite increased p.o. intake and came to ED same day, workup including CT abdomen pelvis showed moderately distended stomach with ingested material but no active inflammation or infection. Patient was reassured and sent home.  Subsequently, patient also developed nauseous vomiting for the last 2 to 3 days with continue with increasing colostomy output mainly liquid stool denied any abdominal pain no fever or chills.  Last night patient has a major vomiting woke up at night and unable to lie down because of the feeling of nausea.  Denies any sick contact.  He has lost to follow-up with GI for Crohn's disease for more than 14 years but denied any flareups in recent years.  ED Course: Afebrile, blood pressure 80/60 nontachycardic nonhypoxic.  CT abdomen pelvis again showed no acute inflammatory process, small heterogenous opacity in the right lower lobe favored to represent aspiration versus pneumonia.  Blood work showed AKI with creatinine 4.2 compared to baseline 2.0, BUN 56, K5.7, bicarb 19, sodium 133, CBC C showed WBC 13.5 hemoglobin 15.9.  EKG  pending  Patient was given IV bolus x 1 and 1 dose of Lokelma.  And ceftriaxone, and during ceftriaxone infusion, patient developed itchiness macular rash on abdominal wall and lower back and bilateral thighs.  Review of Systems: As per HPI otherwise 14 point review of systems negative.   Past Medical History:  Diagnosis Date   Ascending Aortic Dilation    a. 09/2021 Echo: Asc Ao 40mm.   CAD (coronary artery disease)    a. 07/2021 Cath: LM nl, LAD 67m, D2 40, LCX 65p/m, OM1 90, RCA 116m w/ L-.R collats to RPDA from septal 1/2-->Med rx.   Chronic HFrEF (heart failure with reduced ejection fraction) (HCC)    a. 07/2021 Echo: EF 30-35%, glob HK, mod LVH, GrII DD, nl RV fxn, mildly dil LA, mild-mod MR; b. 09/2021 Echo: EF 35-40%, glob HK, mod LVH, GrI DD, nl RV fxn, mildly dil RA, mild MR, Asc Ao 40mm.   CKD (chronic kidney disease) stage 2, GFR 60-89 ml/min    Crohn's disease (HCC)    Hypertension    Mixed Ischemic & Nonischemic cardiomyopathy    a. 07/2021 Echo: EF 30-35%; b. 07/2021 Cath: Occluded RCA w/ mod LAD/LCX dzs, and severe OM1 dzs-->Med Rx; c. 09/2021 Echo: EF 35-40%.   PAH (pulmonary artery hypertension) (HCC)    a. 07/2021 RHC: PA 67/30 (42).   Proteinuria     Past Surgical History:  Procedure Laterality Date   COLECTOMY     CORONARY STENT INTERVENTION N/A 03/02/2023   Procedure: CORONARY STENT INTERVENTION;  Surgeon: Yvonne Kendall, MD;  Location: [facility] INVASIVE CV LAB;  Service: Cardiovascular;  Laterality: N/A;   LEFT HEART CATH  AND CORONARY ANGIOGRAPHY Left 03/02/2023   Procedure: LEFT HEART CATH AND CORONARY ANGIOGRAPHY;  Surgeon: Yvonne Kendall, MD;  Location: [facility] INVASIVE CV LAB;  Service: Cardiovascular;  Laterality: Left;   RIGHT/LEFT HEART CATH AND CORONARY ANGIOGRAPHY N/A 08/07/2021   Procedure: RIGHT/LEFT HEART CATH AND CORONARY ANGIOGRAPHY;  Surgeon: Yvonne Kendall, MD;  Location: [facility] INVASIVE CV LAB;  Service: Cardiovascular;  Laterality: N/A;   RIGHT/LEFT HEART  CATH AND CORONARY ANGIOGRAPHY N/A 09/29/2022   Procedure: RIGHT/LEFT HEART CATH AND CORONARY ANGIOGRAPHY;  Surgeon: Laurey Morale, MD;  Location: [facility] INVASIVE CV LAB;  Service: Cardiovascular;  Laterality: N/A;     reports that he quit smoking about 21 months ago. His smoking use included cigars. He does not have any smokeless tobacco history on file. He reports that he does not currently use alcohol after a past usage of about 1.0 standard drink of alcohol per week. He reports that he does not use drugs.  Allergies  Allergen Reactions   Ceftriaxone Rash    Macular rash with itchness   Ibuprofen Other (See Comments)    History of GIB with ibuprofen    Sulfacetamide Other (See Comments)   Neomycin-Bacitracin Zn-Polymyx     Other reaction(s): Other (See Comments)   Spironolactone     Hyperkalemia    Family History  Problem Relation Age of Onset   Hypertension Mother    Cancer Maternal Aunt    Heart disease Maternal Uncle    Pancreatic cancer Maternal Uncle      Prior to Admission medications   Medication Sig Start Date End Date Taking? Authorizing Provider  acetaminophen (TYLENOL) 325 MG tablet Take 650 mg by mouth at bedtime.    [provider]  Alirocumab (PRALUENT) 150 MG/ML SOAJ Inject 1 mL (150 mg total) into the skin every 14 (fourteen) days. 05/21/23   Laurey Morale, MD  aspirin EC 81 MG tablet Take 1 tablet (81 mg total) by mouth daily. Swallow whole. 05/20/22   Milford, Anderson Malta, [name]  atorvastatin (LIPITOR) 80 MG tablet Take 1 tablet (80 mg total) by mouth every evening. 10/29/22   Jacklynn Ganong, [name]  carvedilol (COREG) 12.5 MG tablet Take 1.5 tablets (18.75 mg total) by mouth 2 (two) times daily with a meal. 05/26/23   Laurey Morale, MD  clopidogrel (PLAVIX) 75 MG tablet Take 1 tablet (75 mg total) by mouth daily. 04/30/23   Laurey Morale, MD  empagliflozin (JARDIANCE) 10 MG TABS tablet Take 1 tablet (10 mg total) by mouth daily. 03/09/23   Ngetich, Dinah  C, NP  ezetimibe (ZETIA) 10 MG tablet Take 1 tablet (10 mg total) by mouth daily. 11/11/22   Laurey Morale, MD  famotidine (PEPCID) 10 MG tablet Take 1 tablet (10 mg total) by mouth at bedtime as needed. Patient not taking: Reported on 06/03/2023 04/12/23   Ngetich, Dinah C, NP  fenofibrate (TRICOR) 48 MG tablet Take 1 tablet (48 mg total) by mouth daily. 03/16/23   Milford, Anderson Malta, [name]  furosemide (LASIX) 20 MG tablet Take 1 tablet (20 mg total) by mouth daily as needed. For weight gain of 3 lb in 24 hours or 5 lbs in one week 04/12/23   Ngetich, Dinah C, NP  isosorbide mononitrate (IMDUR) 60 MG 24 hr tablet Take 1.5 tablets (90 mg total) by mouth daily. 02/26/23 06/03/23  Jacklynn Ganong, [name]  metoCLOPramide (REGLAN) 10 MG tablet Take 1 tablet (10 mg total) by mouth every 8 (eight) hours as  needed. 06/06/23 06/05/24  Dionne Bucy, MD  nitroGLYCERIN (NITROSTAT) 0.4 MG SL tablet Place 1 tablet (0.4 mg total) under the tongue every 5 (five) minutes as needed for chest pain. 07/29/22   Ngetich, Dinah C, NP  pantoprazole (PROTONIX) 40 MG tablet Take 1 tablet (40 mg total) by mouth daily. 10/14/22   Laurey Morale, MD  sacubitril-valsartan (ENTRESTO) 24-26 MG Take 1 tablet by mouth 2 (two) times daily. 05/25/23   Laurey Morale, MD  Semaglutide-Weight Management [facility]) 1 MG/0.5ML SOAJ Inject 1 mg into the skin once a week. 05/21/23   Laurey Morale, MD  spironolactone (ALDACTONE) 25 MG tablet Take 1 tablet (25 mg total) by mouth daily. 04/30/23   Laurey Morale, MD    Physical Exam: Vitals:   06/12/23 1330 06/12/23 1420 06/12/23 1500 06/12/23 1530  BP: 97/68  (!) 94/58 108/74  Pulse: 81  77 77  Resp: (!) 27  19 20   Temp:  98.1 F (36.7 C)  98.1 F (36.7 C)  TempSrc:  Oral  Oral  SpO2:    99%  Weight:      Height:        Constitutional: NAD, calm, comfortable Vitals:   06/12/23 1330 06/12/23 1420 06/12/23 1500 06/12/23 1530  BP: 97/68  (!) 94/58 108/74  Pulse: 81  77 77   Resp: (!) 27  19 20   Temp:  98.1 F (36.7 C)  98.1 F (36.7 C)  TempSrc:  Oral  Oral  SpO2:    99%  Weight:      Height:       Eyes: PERRL, lids and conjunctivae normal ENMT: Mucous membranes are dry. Posterior pharynx clear of any exudate or lesions.Normal dentition.  Neck: normal, supple, no masses, no thyromegaly Respiratory: clear to auscultation bilaterally, no wheezing, no crackles. Normal respiratory effort. No accessory muscle use.  Cardiovascular: Regular rate and rhythm, no murmurs / rubs / gallops. No extremity edema. 2+ pedal pulses. No carotid bruits.  Abdomen: no tenderness, no masses palpated. No hepatosplenomegaly. Increasing bowel sounds positive.  Musculoskeletal: no clubbing / cyanosis. No joint deformity upper and lower extremities. Good ROM, no contractures. Normal muscle tone.  Skin: no rashes, lesions, ulcers. No induration Neurologic: CN 2-12 grossly intact. Sensation intact, DTR normal. Strength 5/5 in all 4.  Psychiatric: Normal judgment and insight. Alert and oriented x 3. Normal mood.    Labs on Admission: I have personally reviewed following labs and imaging studies  CBC: Recent Labs  Lab 06/06/23 1339 06/12/23 0928  WBC 9.1 13.5*  NEUTROABS 6.6  --   HGB 16.2 15.9  HCT 48.5 46.7  MCV 83.5 81.2  PLT 275 302   Basic Metabolic Panel: Recent Labs  Lab 06/06/23 1339 06/12/23 0928  NA 139 133*  K 5.3* 5.7*  CL 106 104  CO2 23 19*  GLUCOSE 118* 129*  BUN 31* 56*  CREATININE 2.00* 4.26*  CALCIUM 10.2 9.7   GFR: Estimated Creatinine Clearance: 24.5 mL/min (A) (by C-G formula based on SCr of 4.26 mg/dL (H)). Liver Function Tests: Recent Labs  Lab 06/06/23 1339 06/12/23 0928  AST 24 20  ALT 30 28  ALKPHOS 61 58  BILITOT 0.8 0.6  PROT 8.5* 8.6*  ALBUMIN 4.7 4.3   Recent Labs  Lab 06/06/23 1339 06/12/23 0928  LIPASE 55* 56*   No results for input(s): "AMMONIA" in the last 168 hours. Coagulation Profile: No results for input(s):  "INR", "PROTIME" in the last 168 hours. Cardiac  Enzymes: No results for input(s): "CKTOTAL", "CKMB", "CKMBINDEX", "TROPONINI" in the last 168 hours. BNP (last 3 results) No results for input(s): "PROBNP" in the last 8760 hours. HbA1C: No results for input(s): "HGBA1C" in the last 72 hours. CBG: No results for input(s): "GLUCAP" in the last 168 hours. Lipid Profile: No results for input(s): "CHOL", "HDL", "LDLCALC", "TRIG", "CHOLHDL", "LDLDIRECT" in the last 72 hours. Thyroid Function Tests: No results for input(s): "TSH", "T4TOTAL", "FREET4", "T3FREE", "THYROIDAB" in the last 72 hours. Anemia Panel: No results for input(s): "VITAMINB12", "FOLATE", "FERRITIN", "TIBC", "IRON", "RETICCTPCT" in the last 72 hours. Urine analysis:    Component Value Date/Time   COLORURINE YELLOW (A) 06/12/2023 0928   APPEARANCEUR HAZY (A) 06/12/2023 0928   APPEARANCEUR Clear 10/24/2012 1000   LABSPEC 1.023 06/12/2023 0928   LABSPEC 1.015 10/24/2012 1000   PHURINE 5.0 06/12/2023 0928   GLUCOSEU 50 (A) 06/12/2023 0928   GLUCOSEU Negative 10/24/2012 1000   HGBUR NEGATIVE 06/12/2023 0928   BILIRUBINUR NEGATIVE 06/12/2023 0928   BILIRUBINUR Negative 10/24/2012 1000   KETONESUR NEGATIVE 06/12/2023 0928   PROTEINUR NEGATIVE 06/12/2023 0928   NITRITE NEGATIVE 06/12/2023 0928   LEUKOCYTESUR NEGATIVE 06/12/2023 0928   LEUKOCYTESUR Negative 10/24/2012 1000    Radiological Exams on Admission: CT ABDOMEN PELVIS WO CONTRAST Result Date: 06/12/2023 CLINICAL DATA:  Abdominal pain, acute, nonlocalized EXAM: CT ABDOMEN AND PELVIS WITHOUT CONTRAST TECHNIQUE: Multidetector CT imaging of the abdomen and pelvis was performed following the standard protocol without IV contrast. RADIATION DOSE REDUCTION: This exam was performed according to the departmental dose-optimization program which includes automated exposure control, adjustment of the mA and/or kV according to patient size and/or use of iterative reconstruction  technique. COMPARISON:  CT scan abdomen and pelvis from 06/06/2023. FINDINGS: Lower chest: Since the prior study, there are new heterogeneous opacities in the right lung lower lobe, medially, favored to represent aspiration versus infective pneumonia. The lung bases are otherwise clear. No pleural effusion. The heart is normal in size. No pericardial effusion. Hepatobiliary: The liver is normal in size. Non-cirrhotic configuration. No suspicious mass. No intrahepatic or extrahepatic bile duct dilation. No calcified gallstones. Normal gallbladder wall thickness. No pericholecystic inflammatory changes. Pancreas: Unremarkable. No pancreatic ductal dilatation or surrounding inflammatory changes. Spleen: Within normal limits. No focal lesion. Adrenals/Urinary Tract: Adrenal glands are unremarkable. No suspicious renal mass within the limitations of this exam. There are 2 hypoattenuating structures in the right kidney lower pole, characterized as simple cysts on the basis of prior exam. No nephroureterolithiasis or obstructive uropathy. Unremarkable urinary bladder. 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 Blind-ending Hartmann's pouch noted. No disproportionate dilation of the small or large bowel loops. No evidence of abnormal bowel wall thickening or inflammatory changes. There is submucosal fat in the Hartmann's pouch. Vascular/Lymphatic: No ascites or pneumoperitoneum. No abdominal or pelvic lymphadenopathy, by size criteria. No aneurysmal dilation of the major abdominal arteries. Reproductive: Normal size prostate. Symmetric seminal vesicles. Other: Umbilical surgical scar noted. 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 The soft tissues and abdominal wall are otherwise unremarkable. Musculoskeletal: No suspicious osseous lesions. There are mild multilevel degenerative changes in  the visualized spine. IMPRESSION: 1. No acute inflammatory process identified within the abdomen or pelvis. 2. Since the prior study, there are new heterogeneous opacities in the right lung lower lobe, favored to represent aspiration versus infectious pneumonia. Correlate clinically. 3. Multiple other nonacute observations, as described  above. Electronically Signed   By: Jules Schick M.D.   On: 06/12/2023 14:42    EKG: Pending  Assessment/Plan Principal Problem:   AKI (acute kidney injury) (HCC) Active Problems:   Gastroenteritis  (please populate well all problems here in Problem List. (For example, if patient is on BP meds at home and you resume or decide to hold them, it is a problem that needs to be her. Same for CAD, COPD, HLD and so on)  Hyperkalemia -Agreed with Lokelma -Continue IV fluid -Recheck kidney function this evening  Acute gastroenteritis -CT abdominal pelvis reassuring. -Etiology likely viral versus food toxin -Supportive care, IV fluid Zofran and pain medication if necessary -Other DDx.  Patient reported no recent near flareups after colectomy and colostomy.  Will check CRP and ESR and fecal Calprotectin.  And consider GI consult if inflammatory markers come back elevated.  Also send GI pathogen. -Start probiotics  AKI on CKD stage IIIa -Prerenal secondary to severe volume contraction and hypotension secondary to ongoing GI loss -Hold off on home BP meds and CHF medications -Continue maintenance IV fluid while monitoring volume status -Repeat BMP tonight and tomorrow morning  Leukocytosis -Probably reactive to gastroenteritis.  Etiology less likely bacterial, monitor off antibiotics -CT abdominal pelvis showed incidental finding of small aspiration on the right lower lobe however patient does not have cough no fever.  At this point, plan to hold off antibiotics given the risk of worsening of diarrhea as well as patient new symptoms of itchiness after ceftriaxone  infusion.  Acute macular rash -Developed in the halfway of ceftriaxone infusion, highly suspect ceftriaxone allergy. -Discontinue ceftriaxone, discussed with ER nurse -Update allergy list. -Solu-Medrol x 1 -As needed Benadryl -Reevaluate rash this evening -No tongue or lip swelling and denied any shortness of breath stridor or wheezing, low suspicion for anaphylaxis action.  CAD with chronic ischemic cardiomyopathy HTN -Volume contracted IV fluid as above -Hold off Lasix, hold off Entresto and spironolactone -Start as needed hydralazine -Continue aspirin Plavix and statin  Hx of Crohn's disease -As above  DVT prophylaxis: Heparin subcu Code Status: Full code Family Communication: None at bedside Disposition Plan: Patient is sick with AKI and hyperkalemia associated with ongoing gastroenteritis, requiring IV fluid and close monitoring K level and kidney function recovery, expect more than 2 midnight hospital stay Consults called: None Admission status: Tele admit   Emeline General MD [facility] Pager [phone]  06/12/2023, 3:59 PM

## 2023-06-12 NOTE — ED Notes (Signed)
VS repeated, BP unchanged.

## 2023-06-12 NOTE — Consult Note (Signed)
CODE SEPSIS - PHARMACY COMMUNICATION  **Broad Spectrum Antibiotics should be administered within 1 hour of Sepsis diagnosis**  Time Code Sepsis Called/Page Received: 1518  Antibiotics Ordered: azithromycin, ceftriaxone  Time of 1st antibiotic administration: 1538  Additional action taken by pharmacy: N/A  Celene Squibb, PharmD Clinical Pharmacist 06/12/2023 3:34 PM

## 2023-06-12 NOTE — ED Provider Notes (Signed)
[facility] Provider Note    Event Date/Time   First MD Initiated Contact with Patient 06/12/23 1246     (approximate)   History   Abdominal Pain   HPI  Clarence [name] is a 53 year old male with history of CHF with a EF of 45 to 50%, current disease s/p ostomy creation, CKD presenting to the emergency department for evaluation of ongoing nausea, vomiting, high ostomy output.  Has had some ongoing right sided abdominal pain for a few weeks.  Over the past 8 days or so, has had frequent episodes of increased ostomy output despite decreased p.o. intake as well as several episodes of vomiting.  Does think he aspirated during one of his episodes of vomiting.  No fevers or chills.  Is concerned he is dehydrated.    He was  seen in our ER on 06/05/2022.  At that time, CMP demonstrated mild elevation in creatinine, reassuring CT, presentation felt to be most likely related to gastroenteritis.     Physical Exam   Triage Vital Signs: ED Triage Vitals  Encounter Vitals Group     BP 06/12/23 0925 (!) 84/63     Systolic BP Percentile --      Diastolic BP Percentile --      Pulse Rate 06/12/23 0925 88     Resp 06/12/23 0925 20     Temp 06/12/23 0925 97.7 F (36.5 C)     Temp Source 06/12/23 0925 Oral     SpO2 06/12/23 0925 100 %     Weight 06/12/23 0924 240 lb (108.9 kg)     Height 06/12/23 0924 5\' 10"  (1.778 m)     Head Circumference --      Peak Flow --      Pain Score 06/12/23 0925 6     Pain Loc --      Pain Education --      Exclude from Growth Chart --     Most recent vital signs: Vitals:   06/12/23 1530 06/12/23 1800  BP: 108/74 110/70  Pulse: 77 72  Resp: 20 18  Temp: 98.1 F (36.7 C) 98.4 F (36.9 C)  SpO2: 99% 100%     General: Awake, interactive  CV:  Regular rate, good peripheral perfusion.  Resp:  Unlabored respirations, lungs clear to auscultation Abd:  Nondistended, soft, tender to palpation most notably in the right lower  quadrant without rebound or guarding, ostomy in place with thin yellow-brown output Neuro:  Symmetric facial movement, fluid speech   ED Results / Procedures / Treatments   Labs (all labs ordered are listed, but only abnormal results are displayed) Labs Reviewed  LIPASE, BLOOD - Abnormal; Notable for the following components:      Result Value   Lipase 56 (*)    All other components within normal limits  COMPREHENSIVE METABOLIC PANEL - Abnormal; Notable for the following components:   Sodium 133 (*)    Potassium 5.7 (*)    CO2 19 (*)    Glucose, Bld 129 (*)    BUN 56 (*)    Creatinine, Ser 4.26 (*)    Total Protein 8.6 (*)    GFR, Estimated 16 (*)    All other components within normal limits  CBC - Abnormal; Notable for the following components:   WBC 13.5 (*)    All other components within normal limits  URINALYSIS, ROUTINE W REFLEX MICROSCOPIC - Abnormal; Notable for the following components:   Color, Urine YELLOW (*)  APPearance HAZY (*)    Glucose, UA 50 (*)    All other components within normal limits  URINALYSIS, W/ REFLEX TO CULTURE (INFECTION SUSPECTED) - Abnormal; Notable for the following components:   Color, Urine YELLOW (*)    APPearance HAZY (*)    Glucose, UA 50 (*)    All other components within normal limits  RESP PANEL BY RT-PCR (RSV, FLU A&B, COVID)  RVPGX2  GASTROINTESTINAL PANEL BY PCR, STOOL (REPLACES STOOL CULTURE)  CULTURE, BLOOD (ROUTINE X 2)  CULTURE, BLOOD (ROUTINE X 2)  CALPROTECTIN, FECAL  LACTIC ACID, PLASMA  LACTIC ACID, PLASMA  BASIC METABOLIC PANEL  SEDIMENTATION RATE  C-REACTIVE PROTEIN  CBC  BASIC METABOLIC PANEL     EKG EKG independently reviewed interpreted by myself (ER attending) demonstrates:    RADIOLOGY Imaging independently reviewed and interpreted by myself demonstrates:  CT abdomen pelvis without acute intra-abdominal pathology, radiology does note opacities in the right lower lung which could reflect aspiration  versus infectious pneumonia  PROCEDURES:  Critical Care performed: Yes, see critical care procedure note(s)  CRITICAL CARE Performed by: Trinna Post   Total critical care time: 32 minutes  Critical care time was exclusive of separately billable procedures and treating other patients.  Critical care was necessary to treat or prevent imminent or life-threatening deterioration.  Critical care was time spent personally by me on the following activities: development of treatment plan with patient and/or surrogate as well as nursing, discussions with consultants, evaluation of patient's response to treatment, examination of patient, obtaining history from patient or surrogate, ordering and performing treatments and interventions, ordering and review of laboratory studies, ordering and review of radiographic studies, pulse oximetry and re-evaluation of patient's condition.   Procedures   MEDICATIONS ORDERED IN ED: Medications  diphenhydrAMINE (BENADRYL) capsule 25 mg (has no administration in time range)  ipratropium-albuterol (DUONEB) 0.5-2.5 (3) MG/3ML nebulizer solution 3 mL (has no administration in time range)  pantoprazole (PROTONIX) injection 40 mg (40 mg Intravenous Given 06/12/23 1621)  0.9 %  sodium chloride infusion ( Intravenous New Bag/Given 06/12/23 1624)  acidophilus (RISAQUAD) capsule 2 capsule (2 capsules Oral Given 06/12/23 1733)  acetaminophen (TYLENOL) tablet 650 mg (has no administration in time range)  aspirin EC tablet 81 mg (81 mg Oral Given 06/12/23 1733)  atorvastatin (LIPITOR) tablet 80 mg (80 mg Oral Given 06/12/23 1734)  ezetimibe (ZETIA) tablet 10 mg (10 mg Oral Given 06/12/23 1734)  fenofibrate tablet 54 mg (has no administration in time range)  clopidogrel (PLAVIX) tablet 75 mg (75 mg Oral Given 06/12/23 1733)  heparin injection 5,000 Units (has no administration in time range)  HYDROmorphone (DILAUDID) injection 0.5-1 mg (has no administration in time range)   hydrALAZINE (APRESOLINE) injection 5 mg (has no administration in time range)  benzonatate (TESSALON) capsule 100 mg (has no administration in time range)  sodium chloride 0.9 % bolus 500 mL (0 mLs Intravenous Stopped 06/12/23 1257)  sodium chloride 0.9 % bolus 500 mL (0 mLs Intravenous Stopped 06/12/23 1519)  ondansetron (ZOFRAN) injection 4 mg (4 mg Intravenous Given 06/12/23 1410)  sodium zirconium cyclosilicate (LOKELMA) packet 10 g (10 g Oral Given 06/12/23 1518)  cefTRIAXone (ROCEPHIN) 2 g in sodium chloride 0.9 % 100 mL IVPB (0 g Intravenous Stopped 06/12/23 1550)  methylPREDNISolone sodium succinate (SOLU-MEDROL) 40 mg/mL injection 40 mg (40 mg Intravenous Given 06/12/23 1621)  diphenhydrAMINE (BENADRYL) injection 25 mg (25 mg Intravenous Given 06/12/23 1622)     IMPRESSION / MDM / ASSESSMENT AND PLAN /  ED COURSE  I reviewed the triage vital signs and the nursing notes.  Differential diagnosis includes, but is not limited to, appendicitis, colitis, bowel obstruction, electrolyte abnormality, dehydration  Patient's presentation is most consistent with acute presentation with potential threat to life or bodily function.  53 year old male presenting with ongoing vomiting and high ostomy output.  Hypotensive on presentation with normal heart rate, improved after fluid resuscitation.  Labs with leukocytosis WC of 13.5.  CMP notable for AKI with creatinine of 4.26, increased from baseline around 2.  Also with mild hyperkalemia with K of 5.7.  Mild lipase elevation, not consistent with pancreatitis.  Given significant lab changes, repeat CT was obtained today which remained without acute intra-abdominal pathology.  Does note right basilar consolidation, suspect likely aspiration based on patient's clinical history.  With leukocytosis and tachypnea, does meet sepsis criteria.  Ordered for empiric antibiotics with Rocephin and azithromycin.  However, I suspect that patient's low blood pressure is  related to hypovolemic shock, not septic shock and I do feel that rapid large-volume fluid resuscitation would be detrimental with his history of congestive heart failure, so I will hold off on 30 cc/kg of fluid resuscitation.  With history of kidney injury, do think he is appropriate for admission.  Discussed possible team.  They will evaluate the patient for anticipated admission.    FINAL CLINICAL IMPRESSION(S) / ED DIAGNOSES   Final diagnoses:  AKI (acute kidney injury) (HCC)  Vomiting and diarrhea     Rx / DC Orders   ED Discharge Orders     None        Note:  This document was prepared using Dragon voice recognition software and may include unintentional dictation errors.   Trinna Post, MD 06/12/23 [phone]

## 2023-06-12 NOTE — ED Triage Notes (Addendum)
Pt to ED for NVD "on and off all day" and mild R side abdominal pain since about 3 weeks ago. Pt has GB and appendix. Denies urinary symptoms. By diarrhea, pt means that colostomy output is more liquid than usual. Pt states he thinks he may be dehydrated. Skin dry. BP is 84/63 in triage with MAP of 71.

## 2023-06-12 NOTE — ED Notes (Signed)
Patient complaining of chest pain and left shoulder pain.  Provider notified.  See new orders.  Provider at the bedside.  EKG reviewed.  Labs collected.  Patient states dose of nitroglycerin alleviated chest pain.

## 2023-06-12 NOTE — Progress Notes (Signed)
       CROSS COVER NOTE  NAME: Clarence [name] MRN: [id number] DOB : [birth year] ATTENDING PHYSICIAN: Emeline General, MD    Date of Service   06/12/2023   HPI/Events of Note   Patient with chronic unstable angina developed sudden onset chest pain 4/10 with numbness to left shourlder and arm   Interventions   Assessment/Plan:    06/12/2023   10:20 PM 06/12/2023    6:00 PM 06/12/2023    3:30 PM  Vitals with BMI  Systolic 102 110 409  Diastolic 66 70 74  Pulse 85 72 77   No acute distress. 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

## 2023-06-12 NOTE — Progress Notes (Signed)
Elink following for sepsis protocol.

## 2023-06-13 ENCOUNTER — Inpatient Hospital Stay: Payer: Medicaid Other

## 2023-06-13 ENCOUNTER — Other Ambulatory Visit: Payer: Self-pay | Admitting: Family

## 2023-06-13 DIAGNOSIS — N179 Acute kidney failure, unspecified: Secondary | ICD-10-CM | POA: Diagnosis not present

## 2023-06-13 DIAGNOSIS — I509 Heart failure, unspecified: Secondary | ICD-10-CM | POA: Diagnosis not present

## 2023-06-13 DIAGNOSIS — R112 Nausea with vomiting, unspecified: Secondary | ICD-10-CM | POA: Diagnosis not present

## 2023-06-13 DIAGNOSIS — I5022 Chronic systolic (congestive) heart failure: Secondary | ICD-10-CM

## 2023-06-13 DIAGNOSIS — I517 Cardiomegaly: Secondary | ICD-10-CM | POA: Diagnosis not present

## 2023-06-13 LAB — CBC
HCT: 41.3 % (ref 39.0–52.0)
Hemoglobin: 14.1 g/dL (ref 13.0–17.0)
MCH: 27.5 pg (ref 26.0–34.0)
MCHC: 34.1 g/dL (ref 30.0–36.0)
MCV: 80.7 fL (ref 80.0–100.0)
Platelets: 256 10*3/uL (ref 150–400)
RBC: 5.12 MIL/uL (ref 4.22–5.81)
RDW: 13.5 % (ref 11.5–15.5)
WBC: 9.1 10*3/uL (ref 4.0–10.5)
nRBC: 0 % (ref 0.0–0.2)

## 2023-06-13 LAB — C-REACTIVE PROTEIN: CRP: 4.1 mg/dL — ABNORMAL HIGH (ref <1.0)

## 2023-06-13 LAB — BASIC METABOLIC PANEL
Anion gap: 9 (ref 5–15)
BUN: 56 mg/dL — ABNORMAL HIGH (ref 6–20)
CO2: 15 mmol/L — ABNORMAL LOW (ref 22–32)
Calcium: 8.5 mg/dL — ABNORMAL LOW (ref 8.9–10.3)
Chloride: 108 mmol/L (ref 98–111)
Creatinine, Ser: 2.87 mg/dL — ABNORMAL HIGH (ref 0.61–1.24)
GFR, Estimated: 25 mL/min — ABNORMAL LOW (ref >60)
Glucose, Bld: 157 mg/dL — ABNORMAL HIGH (ref 70–99)
Potassium: 5.4 mmol/L — ABNORMAL HIGH (ref 3.5–5.1)
Sodium: 132 mmol/L — ABNORMAL LOW (ref 135–145)

## 2023-06-13 LAB — TROPONIN I (HIGH SENSITIVITY): Troponin I (High Sensitivity): 18 ng/L — ABNORMAL HIGH (ref <18)

## 2023-06-13 MED ORDER — SODIUM ZIRCONIUM CYCLOSILICATE 10 G PO PACK
10.0000 g | PACK | Freq: Once | ORAL | Status: AC
  Administered 2023-06-13: 10 g via ORAL
  Filled 2023-06-13: qty 1

## 2023-06-13 MED ORDER — SODIUM CHLORIDE 0.9 % IV SOLN
12.5000 mg | Freq: Four times a day (QID) | INTRAVENOUS | Status: DC | PRN
  Administered 2023-06-13: 12.5 mg via INTRAVENOUS
  Filled 2023-06-13: qty 0.5
  Filled 2023-06-13: qty 12.5

## 2023-06-13 MED ORDER — ONDANSETRON HCL 4 MG/2ML IJ SOLN
4.0000 mg | Freq: Four times a day (QID) | INTRAMUSCULAR | Status: DC | PRN
  Administered 2023-06-13: 4 mg via INTRAVENOUS
  Filled 2023-06-13: qty 2

## 2023-06-13 MED ORDER — HYDROCODONE-ACETAMINOPHEN 5-325 MG PO TABS
1.0000 | ORAL_TABLET | Freq: Four times a day (QID) | ORAL | Status: DC | PRN
  Administered 2023-06-14 – 2023-06-15 (×2): 1 via ORAL
  Filled 2023-06-13 (×2): qty 1

## 2023-06-13 MED ORDER — MORPHINE SULFATE (PF) 2 MG/ML IV SOLN
2.0000 mg | Freq: Once | INTRAVENOUS | Status: AC
  Administered 2023-06-13: 2 mg via INTRAVENOUS
  Filled 2023-06-13: qty 1

## 2023-06-13 NOTE — ED Notes (Signed)
Pt ask for nitroglycerin, RN notified

## 2023-06-13 NOTE — Progress Notes (Signed)
PROGRESS NOTE    Clarence [name]  ZOX:[id number] DOB: Jan 31, 1971 DOA: 06/12/2023 PCP: Caesar Bookman, NP  Chief Complaint  Patient presents with   Abdominal Pain    Hospital Course:  Clarence [name] is 53 y.o. male with hypertension, CKD stage IIIa, Crohn's disease status post partial colectomy and colostomy, hypertension, CAD with ischemic cardiomyopathy, chronic heart failure with reduced EF 45 to 50%, who presented with worsening nausea, vomiting, and increasing colostomy output for the last week.  Patient.  Scented to the ED 1 week ago with similar complaints.  CT abdomen pelvis at that time showed moderately distended stomach with ingested material but no active inflammation or infection.  Patient reports the night prior to arrival vomiting woke him from sleep and he has been unable to lie down.  He denies any sick contacts.  He does not currently follow-up with gastroenterology but it has been more than 14 years since he has had a Crohn's flare.  On arrival to the ED blood pressure was 80/60, he was nontachycardic.  CT abdomen pelvis again revealed no acute inflammatory process, small heterogenous opacity in the right lower lobe favored to be aspiration pneumonia.  Blood work revealed AKI with creatinine of 4.2 compared to a baseline of 2.0, elevated BUN, hyperkalemia.  CBC with hemoconcentrated hemoglobin 15.9 and leukocytosis 13.5.  Patient received Lokelma and IV fluids. He was initiated on ceftriaxone but during infusion developed itchiness and macular rash on abdominal wall and bilateral thighs.  Subjective: Overnight the patient experienced some chest pain which she reports happens chronically.  It resolved with nitroglycerin.  EKG was performed which did show some ST elevations but after cardiology review it was determined that these are chronic.  Troponin was mostly unremarkable.  On evaluation this morning patient has already vomited twice.  He is also complaining of diffuse  abdominal pain.  He reports the morphine is not helping his pain request Norco instead.  Objective: Vitals:   06/13/23 0203 06/13/23 0400 06/13/23 0600 06/13/23 0700  BP: 109/73 103/71 103/83 107/69  Pulse: 92 96 87 93  Resp: (!) 21 15 15  (!) 25  Temp:  97.7 F (36.5 C)    TempSrc:  Oral    SpO2: 95% 93% 95% 98%  Weight:      Height:        Intake/Output Summary (Last 24 hours) at 06/13/2023 8119 Last data filed at 06/12/2023 1550 Gross per 24 hour  Intake 1040 ml  Output --  Net 1040 ml   Filed Weights   06/12/23 0924  Weight: 108.9 kg    Examination: General exam: Appears calm, uncomfortable appearing Respiratory system: No work of breathing, symmetric chest wall expansion Cardiovascular system: S1 & S2 heard, RRR.  Gastrointestinal system: Abdomen is nondistended, colostomy with yellow stool, diffusely tender to palpation in abdomen Neuro: Alert and oriented. No focal neurological deficits. Extremities: Symmetric, expected ROM Skin: No rashes, lesions Psychiatry: Demonstrates appropriate judgement and insight. Mood & affect appropriate for situation.   Assessment & Plan:  Principal Problem:   AKI (acute kidney injury) (HCC) Active Problems:   Gastroenteritis    Nausea vomiting - CT abdomen pelvis reassuring - Likely viral gastroenteritis - Continue supportive care with Zofran/phenergan and as needed pain meds - Avoid additional IV fluids given history of heart failure  CAD with chronic ischemic cardiomyopathy Chest pain, chronic - Chest pain occurred overnight on 1/25.  Patient was evaluated by overnight NP.  Patient reports recurring chest pain episodes  at home and regularly takes nitroglycerin. - EKG performed which revealed some ST elevations in inferior leads - Has been reviewed with cardiology who reports these findings are chronic for this patient secondary to known occluded RCA - Troponin 22, with downtrend to 19 on repeat - Continue telemetry --  Cont home meds  Crohn's disease - Status post colectomy with colostomy, reports no flares in 14 years - CT reassuring. - Minimally elevated ESR - CRP elevated - Fecal calprotectin pending - GI PCR neg - Continue probiotics  AKI on CKD stage IIIa - Baseline appears close to 2.0, 4.29 on arrival - Acute worsening secondary to dehydration, prerenal injury - Status post IV fluids - Will hold off on additional IV fluids given CHF status.  Hold home BP meds CHF meds for now - Trend CMP, creatinine improving now - Avoid nephrotoxic meds - Renally dosed with a creatinine clearance of 36 point needed  Leukocytosis - See above. Suspect viral. - WBC has resolved to normal without antibiotic intervention -- CT abdominal pelvis showed incidental finding of small aspiration on the right lower lobe.  May be early pneumonitis.  Patient is without cough or fever.  hold off antibiotics given the risk of worsening of diarrhea as well as patient new symptoms of itchiness after ceftriaxone infusion.   Hyperkalemia - Lokelma ordered  Acute macular rash - Developed during ceftriaxone infusion, ceftriaxone discontinued - Hold off on further antibiotics as above - Ceftriaxone added to allergy list - Status post Solu-Medrol x 1, as needed Benadryl - Rash resolved now - No evidence of perioral edema, or shortness of breath  Hypertension -Blood pressures thus far have been soft, hold all home antihypertensives for now    DVT prophylaxis: Heparin   Code Status: Full Code Family Communication: Discussed directly with patient Disposition:  Status is: Inpatient     Consultants:      Procedures:    Antimicrobials:  Anti-infectives (From admission, onward)    Start     Dose/Rate Route Frequency Ordered Stop   06/12/23 1530  cefTRIAXone (ROCEPHIN) 2 g in sodium chloride 0.9 % 100 mL IVPB        2 g 200 mL/hr over 30 Minutes Intravenous Once 06/12/23 1518 06/12/23 1550   06/12/23 1530   azithromycin (ZITHROMAX) 500 mg in sodium chloride 0.9 % 250 mL IVPB  Status:  Discontinued        500 mg 250 mL/hr over 60 Minutes Intravenous  Once 06/12/23 1518 06/12/23 1558       Data Reviewed: I have personally reviewed following labs and imaging studies CBC: Recent Labs  Lab 06/06/23 1339 06/12/23 0928 06/13/23 0428  WBC 9.1 13.5* 9.1  NEUTROABS 6.6  --   --   HGB 16.2 15.9 14.1  HCT 48.5 46.7 41.3  MCV 83.5 81.2 80.7  PLT 275 302 256   Basic Metabolic Panel: Recent Labs  Lab 06/06/23 1339 06/12/23 0928 06/12/23 2211 06/13/23 0428  NA 139 133* 130* 132*  K 5.3* 5.7* 5.1 5.4*  CL 106 104 106 108  CO2 23 19* 13* 15*  GLUCOSE 118* 129* 149* 157*  BUN 31* 56* 53* 56*  CREATININE 2.00* 4.26* 3.29* 2.87*  CALCIUM 10.2 9.7 8.5* 8.5*   GFR: Estimated Creatinine Clearance: 36.4 mL/min (A) (by C-G formula based on SCr of 2.87 mg/dL (H)). Liver Function Tests: Recent Labs  Lab 06/06/23 1339 06/12/23 0928  AST 24 20  ALT 30 28  ALKPHOS 61 58  BILITOT  0.8 0.6  PROT 8.5* 8.6*  ALBUMIN 4.7 4.3   CBG: No results for input(s): "GLUCAP" in the last 168 hours.  Recent Results (from the past 240 hours)  Resp panel by RT-PCR (RSV, Flu A&B, Covid) Anterior Nasal Swab     Status: None   Collection Time: 06/12/23  3:26 PM   Specimen: Anterior Nasal Swab  Result Value Ref Range Status   SARS Coronavirus 2 by RT PCR NEGATIVE NEGATIVE Final    Comment: (NOTE) SARS-CoV-2 target nucleic acids are NOT DETECTED.  The SARS-CoV-2 RNA is generally detectable in upper respiratory specimens during the acute phase of infection. The lowest concentration of SARS-CoV-2 viral copies this assay can detect is 138 copies/mL. A negative result does not preclude SARS-Cov-2 infection and should not be used as the sole basis for treatment or other patient management decisions. A negative result may occur with  improper specimen collection/handling, submission of specimen other than  nasopharyngeal swab, presence of viral mutation(s) within the areas targeted by this assay, and inadequate number of viral copies(<138 copies/mL). A negative result must be combined with clinical observations, patient history, and epidemiological information. The expected result is Negative.  Fact Sheet for Patients:  BloggerCourse.com  Fact Sheet for Healthcare Providers:  SeriousBroker.it  This test is no t yet approved or cleared by the [place] FDA and  has been authorized for detection and/or diagnosis of SARS-CoV-2 by FDA under an Emergency Use Authorization (EUA). This EUA will remain  in effect (meaning this test can be used) for the duration of the COVID-19 declaration under Section 564(b)(1) of the Act, 21 U.S.C.section 360bbb-3(b)(1), unless the authorization is terminated  or revoked sooner.       Influenza A by PCR NEGATIVE NEGATIVE Final   Influenza B by PCR NEGATIVE NEGATIVE Final    Comment: (NOTE) The Xpert Xpress SARS-CoV-2/FLU/RSV plus assay is intended as an aid in the diagnosis of influenza from Nasopharyngeal swab specimens and should not be used as a sole basis for treatment. Nasal washings and aspirates are unacceptable for Xpert Xpress SARS-CoV-2/FLU/RSV testing.  Fact Sheet for Patients: BloggerCourse.com  Fact Sheet for Healthcare Providers: SeriousBroker.it  This test is not yet approved or cleared by the [place] FDA and has been authorized for detection and/or diagnosis of SARS-CoV-2 by FDA under an Emergency Use Authorization (EUA). This EUA will remain in effect (meaning this test can be used) for the duration of the COVID-19 declaration under Section 564(b)(1) of the Act, 21 U.S.C. section 360bbb-3(b)(1), unless the authorization is terminated or revoked.     Resp Syncytial Virus by PCR NEGATIVE NEGATIVE Final    Comment:  (NOTE) Fact Sheet for Patients: BloggerCourse.com  Fact Sheet for Healthcare Providers: SeriousBroker.it  This test is not yet approved or cleared by the [place] FDA and has been authorized for detection and/or diagnosis of SARS-CoV-2 by FDA under an Emergency Use Authorization (EUA). This EUA will remain in effect (meaning this test can be used) for the duration of the COVID-19 declaration under Section 564(b)(1) of the Act, 21 U.S.C. section 360bbb-3(b)(1), unless the authorization is terminated or revoked.  Performed at [facility], [address] [place], [place] [address]   Blood Culture (routine x 2)     Status: None (Preliminary result)   Collection Time: 06/12/23  3:26 PM   Specimen: BLOOD  Result Value Ref Range Status   Specimen Description BLOOD BLOOD LEFT ARM  Final   Special Requests   Final  BOTTLES DRAWN AEROBIC AND ANAEROBIC Blood Culture adequate volume   Culture   Final    NO GROWTH < 24 HOURS Performed at [facility], [address] [place], [place] [address]    Report Status PENDING  Incomplete  Blood Culture (routine x 2)     Status: None (Preliminary result)   Collection Time: 06/12/23  3:26 PM   Specimen: BLOOD  Result Value Ref Range Status   Specimen Description BLOOD LEFT ANTECUBITAL  Final   Special Requests   Final    BOTTLES DRAWN AEROBIC AND ANAEROBIC Blood Culture adequate volume   Culture   Final    NO GROWTH < 24 HOURS Performed at [facility], [address] [place], [place] [address]    Report Status PENDING  Incomplete  Gastrointestinal Panel by PCR , Stool     Status: None   Collection Time: 06/12/23  4:12 PM   Specimen: Stool  Result Value Ref Range Status   Campylobacter species NOT DETECTED NOT DETECTED Final   Plesimonas shigelloides NOT DETECTED NOT DETECTED Final   Salmonella species NOT DETECTED NOT DETECTED Final   Yersinia  enterocolitica NOT DETECTED NOT DETECTED Final   Vibrio species NOT DETECTED NOT DETECTED Final   Vibrio cholerae NOT DETECTED NOT DETECTED Final   Enteroaggregative E coli (EAEC) NOT DETECTED NOT DETECTED Final   Enteropathogenic E coli (EPEC) NOT DETECTED NOT DETECTED Final   Enterotoxigenic E coli (ETEC) NOT DETECTED NOT DETECTED Final   Shiga like toxin producing E coli (STEC) NOT DETECTED NOT DETECTED Final   Shigella/Enteroinvasive E coli (EIEC) NOT DETECTED NOT DETECTED Final   Cryptosporidium NOT DETECTED NOT DETECTED Final   Cyclospora cayetanensis NOT DETECTED NOT DETECTED Final   Entamoeba histolytica NOT DETECTED NOT DETECTED Final   Giardia lamblia NOT DETECTED NOT DETECTED Final   Adenovirus F40/41 NOT DETECTED NOT DETECTED Final   Astrovirus NOT DETECTED NOT DETECTED Final   Norovirus GI/GII NOT DETECTED NOT DETECTED Final   Rotavirus A NOT DETECTED NOT DETECTED Final   Sapovirus (I, II, IV, and V) NOT DETECTED NOT DETECTED Final    Comment: Performed at [facility], [address], [place], [place] [address]     Radiology Studies: DG Chest 1 View Result Date: 06/13/2023 CLINICAL DATA:  [phone] with CHF, nausea and vomiting. EXAM: CHEST  1 VIEW COMPARISON:  Portable chest yesterday at 3:11 p.m. FINDINGS: 7:02 a.m. The lungs are clear. There is mild cardiomegaly with normal vascular pattern. The mediastinum is normally outlined. The sulci are sharp. The thoracic cage intact. IMPRESSION: No evidence of acute chest disease. Mild cardiomegaly. Stable chest. Electronically Signed   By: Almira Bar M.D.   On: 06/13/2023 07:54   DG Chest Portable 1 View Result Date: 06/12/2023 CLINICAL DATA:  Infectious/inflammatory nodules in the partially imaged right lower lobe on same day CT abdomen and pelvis EXAM: PORTABLE CHEST 1 VIEW COMPARISON:  Same day CT abdomen and pelvis, chest radiograph dated 06/03/2023 FINDINGS: Low lung volumes with bronchovascular crowding. No focal  consolidations. No pleural effusion or pneumothorax. The heart size and mediastinal contours are within normal limits. No acute osseous abnormality. IMPRESSION: 1. Low lung volumes with bronchovascular crowding. No focal consolidations. 2. Right lower lung opacity described on same day CT is not well appreciated by chest radiograph. Electronically Signed   By: Agustin Cree M.D.   On: 06/12/2023 16:07   CT ABDOMEN PELVIS WO CONTRAST Result Date: 06/12/2023 CLINICAL DATA:  Abdominal  pain, acute, nonlocalized EXAM: CT ABDOMEN AND PELVIS WITHOUT CONTRAST TECHNIQUE: Multidetector CT imaging of the abdomen and pelvis was performed following the standard protocol without IV contrast. RADIATION DOSE REDUCTION: This exam was performed according to the departmental dose-optimization program which includes automated exposure control, adjustment of the mA and/or kV according to patient size and/or use of iterative reconstruction technique. COMPARISON:  CT scan abdomen and pelvis from 06/06/2023. FINDINGS: Lower chest: Since the prior study, there are new heterogeneous opacities in the right lung lower lobe, medially, favored to represent aspiration versus infective pneumonia. The lung bases are otherwise clear. No pleural effusion. The heart is normal in size. No pericardial effusion. Hepatobiliary: The liver is normal in size. Non-cirrhotic configuration. No suspicious mass. No intrahepatic or extrahepatic bile duct dilation. No calcified gallstones. Normal gallbladder wall thickness. No pericholecystic inflammatory changes. Pancreas: Unremarkable. No pancreatic ductal dilatation or surrounding inflammatory changes. Spleen: Within normal limits. No focal lesion. Adrenals/Urinary Tract: Adrenal glands are unremarkable. No suspicious renal mass within the limitations of this exam. There are 2 hypoattenuating structures in the right kidney lower pole, characterized as simple cysts on the basis of prior exam. No  nephroureterolithiasis or obstructive uropathy. Unremarkable urinary bladder. 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 Blind-ending Hartmann's pouch noted. No disproportionate dilation of the small or large bowel loops. No evidence of abnormal bowel wall thickening or inflammatory changes. There is submucosal fat in the Hartmann's pouch. Vascular/Lymphatic: No ascites or pneumoperitoneum. No abdominal or pelvic lymphadenopathy, by size criteria. No aneurysmal dilation of the major abdominal arteries. Reproductive: Normal size prostate. Symmetric seminal vesicles. Other: Umbilical surgical scar noted. 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 The soft tissues and abdominal wall are otherwise unremarkable. Musculoskeletal: No suspicious osseous lesions. There are mild multilevel degenerative changes in the visualized spine. IMPRESSION: 1. No acute inflammatory process identified within the abdomen or pelvis. 2. Since the prior study, there are new heterogeneous opacities in the right lung lower lobe, favored to represent aspiration versus infectious pneumonia. Correlate clinically. 3. Multiple other nonacute observations, as described above. Electronically Signed   By: Jules Schick M.D.   On: 06/12/2023 14:42    Scheduled Meds:  acetaminophen  650 mg Oral QHS   acidophilus  2 capsule Oral TID   aspirin EC  81 mg Oral Daily   atorvastatin  80 mg Oral QPM   clopidogrel  75 mg Oral Daily   ezetimibe  10 mg Oral Daily   fenofibrate  54 mg Oral Daily   heparin  5,000 Units Subcutaneous Q12H   pantoprazole (PROTONIX) IV  40 mg Intravenous Q24H   Continuous Infusions:  sodium chloride 100 mL/hr at 06/13/23 0423     LOS: 1 day    Time spent:   Debarah Crape, DO [facility]  To contact the attending physician between 7A-7P please use Epic Chat. To  contact the covering physician during after hours 7P-7A, please review Amion.   06/13/2023, 8:12 AM   *This document has been created with the assistance of dictation software. Please excuse typographical errors. *

## 2023-06-13 NOTE — Progress Notes (Signed)
The patient is admitted from ED to 2 C 30. A & O x 4. He's oriented to his room, call bell and staff. Patient voiced no concern. Full report to epic completed. Will continue to monitor.

## 2023-06-14 DIAGNOSIS — N179 Acute kidney failure, unspecified: Secondary | ICD-10-CM | POA: Diagnosis not present

## 2023-06-14 LAB — CBC WITH DIFFERENTIAL/PLATELET
Abs Immature Granulocytes: 0.04 10*3/uL (ref 0.00–0.07)
Basophils Absolute: 0 10*3/uL (ref 0.0–0.1)
Basophils Relative: 0 %
Eosinophils Absolute: 0.2 10*3/uL (ref 0.0–0.5)
Eosinophils Relative: 2 %
HCT: 43.9 % (ref 39.0–52.0)
Hemoglobin: 15 g/dL (ref 13.0–17.0)
Immature Granulocytes: 0 %
Lymphocytes Relative: 12 %
Lymphs Abs: 1.2 10*3/uL (ref 0.7–4.0)
MCH: 27.8 pg (ref 26.0–34.0)
MCHC: 34.2 g/dL (ref 30.0–36.0)
MCV: 81.3 fL (ref 80.0–100.0)
Monocytes Absolute: 1.1 10*3/uL — ABNORMAL HIGH (ref 0.1–1.0)
Monocytes Relative: 11 %
Neutro Abs: 8.1 10*3/uL — ABNORMAL HIGH (ref 1.7–7.7)
Neutrophils Relative %: 75 %
Platelets: 292 10*3/uL (ref 150–400)
RBC: 5.4 MIL/uL (ref 4.22–5.81)
RDW: 14.1 % (ref 11.5–15.5)
WBC: 10.7 10*3/uL — ABNORMAL HIGH (ref 4.0–10.5)
nRBC: 0 % (ref 0.0–0.2)

## 2023-06-14 LAB — COMPREHENSIVE METABOLIC PANEL
ALT: 20 U/L (ref 0–44)
AST: 19 U/L (ref 15–41)
Albumin: 3.5 g/dL (ref 3.5–5.0)
Alkaline Phosphatase: 46 U/L (ref 38–126)
Anion gap: 11 (ref 5–15)
BUN: 67 mg/dL — ABNORMAL HIGH (ref 6–20)
CO2: 16 mmol/L — ABNORMAL LOW (ref 22–32)
Calcium: 8.7 mg/dL — ABNORMAL LOW (ref 8.9–10.3)
Chloride: 108 mmol/L (ref 98–111)
Creatinine, Ser: 2.67 mg/dL — ABNORMAL HIGH (ref 0.61–1.24)
GFR, Estimated: 28 mL/min — ABNORMAL LOW (ref >60)
Glucose, Bld: 132 mg/dL — ABNORMAL HIGH (ref 70–99)
Potassium: 4.9 mmol/L (ref 3.5–5.1)
Sodium: 135 mmol/L (ref 135–145)
Total Bilirubin: 0.7 mg/dL (ref 0.0–1.2)
Total Protein: 6.8 g/dL (ref 6.5–8.1)

## 2023-06-14 NOTE — Plan of Care (Signed)

## 2023-06-14 NOTE — TOC Initial Note (Signed)
Transition of Care [facility]) - Initial/Assessment Note    Patient Details  Name: Clarence [name] MRN: [id number] Date of Birth: [birth year]  Transition of [facility]) CM/SW Contact:    Margarito Liner, LCSW Phone Number: 06/14/2023, 1:25 PM  Clinical Narrative:  Readmission prevention screen complete. CSW met with patient. No supports at bedside. CSW introduced role and explained that discharge planning would be discussed. PCP is Emmaline Life, NP. Patient drives himself to appointments. [facility] [address]. No issues obtaining medications. Patient lives at home with his mother and uncle and is his mother's caregiver. Patient became tearful while discussing his mother because she is currently in the ICU. No home health or DME use prior to admission. No further concerns. CSW encouraged patient to contact CSW as needed. CSW will continue to follow patient for support and facilitate return home once stable.                Expected Discharge Plan: Home/Self Care Barriers to Discharge: Continued Medical Work up   Patient Goals and CMS Choice            Expected Discharge Plan and Services     Post Acute Care Choice: NA Living arrangements for the past 2 months: Single Family Home                                      Prior Living Arrangements/Services Living arrangements for the past 2 months: Single Family Home Lives with:: Parents, Relatives Patient language and need for interpreter reviewed:: Yes Do you feel safe going back to the place where you live?: Yes      Need for Family Participation in Patient Care: Yes (Comment) Care giver support system in place?: Yes (comment)   Criminal Activity/Legal Involvement Pertinent to Current Situation/Hospitalization: No - Comment as needed  Activities of Daily Living   ADL Screening (condition at time of admission) Independently performs ADLs?: Yes (appropriate for developmental age) Is the patient deaf or have  difficulty hearing?: No Does the patient have difficulty seeing, even when wearing glasses/contacts?: No Does the patient have difficulty concentrating, remembering, or making decisions?: No  Permission Sought/Granted                  Emotional Assessment Appearance:: Appears stated age Attitude/Demeanor/Rapport: Engaged, Gracious Affect (typically observed): Accepting, Appropriate, Calm, Pleasant, Tearful/Crying Orientation: : Oriented to Self, Oriented to Place, Oriented to  Time, Oriented to Situation Alcohol / Substance Use: Not Applicable Psych Involvement: No (comment)  Admission diagnosis:  Vomiting and diarrhea [R11.10, R19.7] AKI (acute kidney injury) (HCC) [N17.9] Patient Active Problem List   Diagnosis Date Noted   Gastroenteritis 06/12/2023   Unstable angina (HCC) 03/03/2023   AKI (acute kidney injury) (HCC) 03/03/2023   CKD stage 3a, GFR 45-59 ml/min (HCC) 03/03/2023   Elevated troponin 03/03/2023   CAD S/P percutaneous coronary angioplasty 03/03/2023   CHF (congestive heart failure) (HCC) 09/29/2022   Tinea cruris 09/14/2022   Folliculitis 09/14/2022   Chest pain 09/11/2022   Functional diarrhea 09/11/2022   Dizziness 09/10/2022   Coronary artery disease of native artery of native heart with stable angina pectoris (HCC) 03/12/2022   Mixed hyperlipidemia 03/12/2022   Excessive daytime sleepiness 10/31/2021   Ischemic cardiomyopathy 10/21/2021   Pulmonary hypertension (HCC) 10/21/2021   Demand ischemia , possible NSTEMI 10/21/2021   NSTEMI (non-ST elevated myocardial infarction) (HCC) 10/21/2021  Chronic HFrEF (heart failure with reduced ejection fraction) (HCC) 10/07/2021   Dyslipidemia 09/09/2021   Essential hypertension 08/04/2021   Crohn's disease (HCC) 08/04/2021   Pain in joint of left shoulder 08/30/2017   Impingement syndrome of left shoulder region 08/30/2017   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 [place] [address] Phone: [phone] Fax: [phone]     Social Drivers of Health (SDOH) Social History: SDOH Screenings   Food Insecurity: No Food Insecurity (06/13/2023)  Housing: Low Risk  (06/13/2023)  Transportation Needs: Unknown (06/13/2023)  Utilities: Patient Declined (06/13/2023)  Depression (PHQ2-9): Low Risk  (01/05/2023)  Financial Resource Strain: High Risk (01/29/2022)  Social Connections: Unknown (09/29/2021)   [facility] from [facility], [facility]  Tobacco Use: Medium Risk (06/12/2023)   SDOH Interventions:     Readmission Risk Interventions    06/14/2023    1:23 PM  Readmission Risk Prevention Plan  Transportation Screening Complete  PCP or Specialist Appt within 3-5 Days Complete  Social Work Consult for Recovery Care Planning/Counseling Complete  Palliative Care Screening Not Applicable  Medication Review Oceanographer) Complete

## 2023-06-14 NOTE — Plan of Care (Signed)
  Problem: Education: Goal: Knowledge of General Education information will improve Description: Including pain rating scale, medication(s)/side effects and non-pharmacologic comfort measures Outcome: Progressing   Problem: Clinical Measurements: Goal: Ability to maintain clinical measurements within normal limits will improve Outcome: Progressing Goal: Respiratory complications will improve Outcome: Progressing   Problem: Activity: Goal: Risk for activity intolerance will decrease Outcome: Progressing   Problem: Elimination: Goal: Will not experience complications related to urinary retention Outcome: Progressing   Problem: Pain Managment: Goal: General experience of comfort will improve and/or be controlled Outcome: Progressing   Problem: Elimination: Goal: Will not experience complications related to bowel motility Outcome: Not Progressing

## 2023-06-14 NOTE — Progress Notes (Signed)
PROGRESS NOTE    Clarence [name]  VWU:[id number] DOB: 14-Jul-1970 DOA: 06/12/2023 PCP: Caesar Bookman, NP  Chief Complaint  Patient presents with   Abdominal Pain    Hospital Course:  Clarence [name] is 53 y.o. male with hypertension, CKD stage IIIa, Crohn's disease status post partial colectomy and colostomy, hypertension, CAD with ischemic cardiomyopathy, chronic heart failure with reduced EF 45 to 50%, who presented with worsening nausea, vomiting, and increasing colostomy output for the last week.  Patient.  Scented to the ED 1 week ago with similar complaints.  CT abdomen pelvis at that time showed moderately distended stomach with ingested material but no active inflammation or infection.  Patient reports the night prior to arrival vomiting woke him from sleep and he has been unable to lie down.  He denies any sick contacts.  He does not currently follow-up with gastroenterology but it has been more than 14 years since he has had a Crohn's flare.  On arrival to the ED blood pressure was 80/60, he was nontachycardic.  CT abdomen pelvis again revealed no acute inflammatory process, small heterogenous opacity in the right lower lobe favored to be aspiration pneumonia.  Blood work revealed AKI with creatinine of 4.2 compared to a baseline of 2.0, elevated BUN, hyperkalemia.  CBC with hemoconcentrated hemoglobin 15.9 and leukocytosis 13.5.  Patient received Lokelma and IV fluids. He was initiated on ceftriaxone but during infusion developed itchiness and macular rash on abdominal wall and bilateral thighs.  Subjective: No acute events overnight. On evaluation today patient reports feeling much better. No vomiting and no diarrhea today. He is tolerating a full diet.  He shares with me that his mom is currently admitted here in the ICU  Objective: Vitals:   06/13/23 1728 06/13/23 2148 06/14/23 0314 06/14/23 0827  BP:  131/87 117/78 116/81  Pulse:  81 78 78  Resp:  18 18 16   Temp: (!) 97.4  F (36.3 C) 97.9 F (36.6 C)  97.6 F (36.4 C)  TempSrc: Oral Oral  Oral  SpO2:  99% 98% 97%  Weight:      Height:       No intake or output data in the 24 hours ending 06/14/23 0847  Filed Weights   06/12/23 0924  Weight: 108.9 kg    Examination: General exam: Appears calm, uncomfortable appearing Respiratory system: No work of breathing, symmetric chest wall expansion Cardiovascular system: S1 & S2 heard, RRR.  Gastrointestinal system: Abdomen is nondistended, colostomy with yellow stool, diffusely tender to palpation in abdomen Neuro: Alert and oriented. No focal neurological deficits. Extremities: Symmetric, expected ROM Skin: No rashes, lesions Psychiatry: Demonstrates appropriate judgement and insight. Mood & affect appropriate for situation.   Assessment & Plan:  Principal Problem:   AKI (acute kidney injury) (HCC) Active Problems:   Gastroenteritis    Nausea vomiting - CT abdomen pelvis reassuring - Likely viral gastroenteritis, self resolving - Continue supportive care with Zofran/phenergan and as needed pain meds - Avoid additional IV fluids given history of heart failure  CAD with chronic ischemic cardiomyopathy Chest pain, chronic - none today - Chest pain occurred overnight on 1/25.  Patient was evaluated by overnight NP.  Patient reports recurring chest pain episodes at home and regularly takes nitroglycerin. - EKG performed which revealed some ST elevations in inferior leads - Has been reviewed with cardiology who reports these findings are chronic for this patient secondary to known occluded RCA - Troponin 22, with downtrend to 19 on  repeat - Continue telemetry -- Cont home meds  Crohn's disease - Status post colectomy with colostomy, reports no flares in 14 years - CT reassuring. - Minimally elevated ESR - CRP elevated - Fecal calprotectin pending - GI PCR neg - Continue probiotics  AKI on CKD stage IIIa - Baseline appears close to 2.0,  4.29 on arrival - Acute worsening secondary to dehydration, prerenal injury - Status post IV fluids - Will hold off on additional IV fluids given CHF status.  Hold home BP meds CHF meds for now - Trend CMP, creatinine improving now - Avoid nephrotoxic meds - Renally dosed with a creatinine clearance of 36 point needed  Leukocytosis - See above. Suspect viral. - WBC has resolved to normal without antibiotic intervention -- CT abdominal pelvis showed incidental finding of small aspiration on the right lower lobe.  May be early pneumonitis.  Patient is without cough or fever.  hold off antibiotics given the risk of worsening of diarrhea as well as patient new symptoms of itchiness after ceftriaxone infusion.   Hyperkalemia - Lokelma ordered  Acute macular rash - Developed during ceftriaxone infusion, ceftriaxone discontinued - Hold off on further antibiotics as above - Ceftriaxone added to allergy list - Status post Solu-Medrol x 1, as needed Benadryl - Rash resolved now - No evidence of perioral edema, or shortness of breath  Hypertension -Blood pressures thus far have been soft, hold all home antihypertensives for now    DVT prophylaxis: Heparin   Code Status: Full Code Family Communication: Discussed directly with patient Disposition:  Status is: Inpatient     Consultants:      Procedures:    Antimicrobials:  Anti-infectives (From admission, onward)    Start     Dose/Rate Route Frequency Ordered Stop   06/12/23 1530  cefTRIAXone (ROCEPHIN) 2 g in sodium chloride 0.9 % 100 mL IVPB        2 g 200 mL/hr over 30 Minutes Intravenous Once 06/12/23 1518 06/12/23 1550   06/12/23 1530  azithromycin (ZITHROMAX) 500 mg in sodium chloride 0.9 % 250 mL IVPB  Status:  Discontinued        500 mg 250 mL/hr over 60 Minutes Intravenous  Once 06/12/23 1518 06/12/23 1558       Data Reviewed: I have personally reviewed following labs and imaging studies CBC: Recent Labs  Lab  06/12/23 0928 06/13/23 0428  WBC 13.5* 9.1  HGB 15.9 14.1  HCT 46.7 41.3  MCV 81.2 80.7  PLT 302 256   Basic Metabolic Panel: Recent Labs  Lab 06/12/23 0928 06/12/23 2211 06/13/23 0428  NA 133* 130* 132*  K 5.7* 5.1 5.4*  CL 104 106 108  CO2 19* 13* 15*  GLUCOSE 129* 149* 157*  BUN 56* 53* 56*  CREATININE 4.26* 3.29* 2.87*  CALCIUM 9.7 8.5* 8.5*   GFR: Estimated Creatinine Clearance: 36.4 mL/min (A) (by C-G formula based on SCr of 2.87 mg/dL (H)). Liver Function Tests: Recent Labs  Lab 06/12/23 0928  AST 20  ALT 28  ALKPHOS 58  BILITOT 0.6  PROT 8.6*  ALBUMIN 4.3   CBG: No results for input(s): "GLUCAP" in the last 168 hours.  Recent Results (from the past 240 hours)  Resp panel by RT-PCR (RSV, Flu A&B, Covid) Anterior Nasal Swab     Status: None   Collection Time: 06/12/23  3:26 PM   Specimen: Anterior Nasal Swab  Result Value Ref Range Status   SARS Coronavirus 2 by RT PCR NEGATIVE NEGATIVE  Final    Comment: (NOTE) SARS-CoV-2 target nucleic acids are NOT DETECTED.  The SARS-CoV-2 RNA is generally detectable in upper respiratory specimens during the acute phase of infection. The lowest concentration of SARS-CoV-2 viral copies this assay can detect is 138 copies/mL. A negative result does not preclude SARS-Cov-2 infection and should not be used as the sole basis for treatment or other patient management decisions. A negative result may occur with  improper specimen collection/handling, submission of specimen other than nasopharyngeal swab, presence of viral mutation(s) within the areas targeted by this assay, and inadequate number of viral copies(<138 copies/mL). A negative result must be combined with clinical observations, patient history, and epidemiological information. The expected result is Negative.  Fact Sheet for Patients:  BloggerCourse.com  Fact Sheet for Healthcare Providers:   SeriousBroker.it  This test is no t yet approved or cleared by the [place] FDA and  has been authorized for detection and/or diagnosis of SARS-CoV-2 by FDA under an Emergency Use Authorization (EUA). This EUA will remain  in effect (meaning this test can be used) for the duration of the COVID-19 declaration under Section 564(b)(1) of the Act, 21 U.S.C.section 360bbb-3(b)(1), unless the authorization is terminated  or revoked sooner.       Influenza A by PCR NEGATIVE NEGATIVE Final   Influenza B by PCR NEGATIVE NEGATIVE Final    Comment: (NOTE) The Xpert Xpress SARS-CoV-2/FLU/RSV plus assay is intended as an aid in the diagnosis of influenza from Nasopharyngeal swab specimens and should not be used as a sole basis for treatment. Nasal washings and aspirates are unacceptable for Xpert Xpress SARS-CoV-2/FLU/RSV testing.  Fact Sheet for Patients: BloggerCourse.com  Fact Sheet for Healthcare Providers: SeriousBroker.it  This test is not yet approved or cleared by the [place] FDA and has been authorized for detection and/or diagnosis of SARS-CoV-2 by FDA under an Emergency Use Authorization (EUA). This EUA will remain in effect (meaning this test can be used) for the duration of the COVID-19 declaration under Section 564(b)(1) of the Act, 21 U.S.C. section 360bbb-3(b)(1), unless the authorization is terminated or revoked.     Resp Syncytial Virus by PCR NEGATIVE NEGATIVE Final    Comment: (NOTE) Fact Sheet for Patients: BloggerCourse.com  Fact Sheet for Healthcare Providers: SeriousBroker.it  This test is not yet approved or cleared by the [place] FDA and has been authorized for detection and/or diagnosis of SARS-CoV-2 by FDA under an Emergency Use Authorization (EUA). This EUA will remain in effect (meaning this test can be used) for  the duration of the COVID-19 declaration under Section 564(b)(1) of the Act, 21 U.S.C. section 360bbb-3(b)(1), unless the authorization is terminated or revoked.  Performed at [facility], [address] [place], [place] [address]   Blood Culture (routine x 2)     Status: None (Preliminary result)   Collection Time: 06/12/23  3:26 PM   Specimen: BLOOD  Result Value Ref Range Status   Specimen Description BLOOD BLOOD LEFT ARM  Final   Special Requests   Final    BOTTLES DRAWN AEROBIC AND ANAEROBIC Blood Culture adequate volume   Culture   Final    NO GROWTH 2 DAYS Performed at [facility], [address] [place], [place] [address]    Report Status PENDING  Incomplete  Blood Culture (routine x 2)     Status: None (Preliminary result)   Collection Time: 06/12/23  3:26 PM   Specimen: BLOOD  Result Value Ref Range Status  Specimen Description BLOOD LEFT ANTECUBITAL  Final   Special Requests   Final    BOTTLES DRAWN AEROBIC AND ANAEROBIC Blood Culture adequate volume   Culture   Final    NO GROWTH 2 DAYS Performed at [facility], [address] [place], [place] [address]    Report Status PENDING  Incomplete  Gastrointestinal Panel by PCR , Stool     Status: None   Collection Time: 06/12/23  4:12 PM   Specimen: Stool  Result Value Ref Range Status   Campylobacter species NOT DETECTED NOT DETECTED Final   Plesimonas shigelloides NOT DETECTED NOT DETECTED Final   Salmonella species NOT DETECTED NOT DETECTED Final   Yersinia enterocolitica NOT DETECTED NOT DETECTED Final   Vibrio species NOT DETECTED NOT DETECTED Final   Vibrio cholerae NOT DETECTED NOT DETECTED Final   Enteroaggregative E coli (EAEC) NOT DETECTED NOT DETECTED Final   Enteropathogenic E coli (EPEC) NOT DETECTED NOT DETECTED Final   Enterotoxigenic E coli (ETEC) NOT DETECTED NOT DETECTED Final   Shiga like toxin producing E coli (STEC) NOT DETECTED NOT DETECTED Final    Shigella/Enteroinvasive E coli (EIEC) NOT DETECTED NOT DETECTED Final   Cryptosporidium NOT DETECTED NOT DETECTED Final   Cyclospora cayetanensis NOT DETECTED NOT DETECTED Final   Entamoeba histolytica NOT DETECTED NOT DETECTED Final   Giardia lamblia NOT DETECTED NOT DETECTED Final   Adenovirus F40/41 NOT DETECTED NOT DETECTED Final   Astrovirus NOT DETECTED NOT DETECTED Final   Norovirus GI/GII NOT DETECTED NOT DETECTED Final   Rotavirus A NOT DETECTED NOT DETECTED Final   Sapovirus (I, II, IV, and V) NOT DETECTED NOT DETECTED Final    Comment: Performed at [facility], [address], [place], [place] [address]     Radiology Studies: DG Chest 1 View Result Date: 06/13/2023 CLINICAL DATA:  [id number] with CHF, nausea and vomiting. EXAM: CHEST  1 VIEW COMPARISON:  Portable chest yesterday at 3:11 p.m. FINDINGS: 7:02 a.m. The lungs are clear. There is mild cardiomegaly with normal vascular pattern. The mediastinum is normally outlined. The sulci are sharp. The thoracic cage intact. IMPRESSION: No evidence of acute chest disease. Mild cardiomegaly. Stable chest. Electronically Signed   By: Almira Bar M.D.   On: 06/13/2023 07:54   DG Chest Portable 1 View Result Date: 06/12/2023 CLINICAL DATA:  Infectious/inflammatory nodules in the partially imaged right lower lobe on same day CT abdomen and pelvis EXAM: PORTABLE CHEST 1 VIEW COMPARISON:  Same day CT abdomen and pelvis, chest radiograph dated 06/03/2023 FINDINGS: Low lung volumes with bronchovascular crowding. No focal consolidations. No pleural effusion or pneumothorax. The heart size and mediastinal contours are within normal limits. No acute osseous abnormality. IMPRESSION: 1. Low lung volumes with bronchovascular crowding. No focal consolidations. 2. Right lower lung opacity described on same day CT is not well appreciated by chest radiograph. Electronically Signed   By: Agustin Cree M.D.   On: 06/12/2023 16:07   CT ABDOMEN PELVIS WO  CONTRAST Result Date: 06/12/2023 CLINICAL DATA:  Abdominal pain, acute, nonlocalized EXAM: CT ABDOMEN AND PELVIS WITHOUT CONTRAST TECHNIQUE: Multidetector CT imaging of the abdomen and pelvis was performed following the standard protocol without IV contrast. RADIATION DOSE REDUCTION: This exam was performed according to the departmental dose-optimization program which includes automated exposure control, adjustment of the mA and/or kV according to patient size and/or use of iterative reconstruction technique. COMPARISON:  CT scan abdomen and pelvis from 06/06/2023. FINDINGS: Lower chest: Since the prior study,  there are new heterogeneous opacities in the right lung lower lobe, medially, favored to represent aspiration versus infective pneumonia. The lung bases are otherwise clear. No pleural effusion. The heart is normal in size. No pericardial effusion. Hepatobiliary: The liver is normal in size. Non-cirrhotic configuration. No suspicious mass. No intrahepatic or extrahepatic bile duct dilation. No calcified gallstones. Normal gallbladder wall thickness. No pericholecystic inflammatory changes. Pancreas: Unremarkable. No pancreatic ductal dilatation or surrounding inflammatory changes. Spleen: Within normal limits. No focal lesion. Adrenals/Urinary Tract: Adrenal glands are unremarkable. No suspicious renal mass within the limitations of this exam. There are 2 hypoattenuating structures in the right kidney lower pole, characterized as simple cysts on the basis of prior exam. No nephroureterolithiasis or obstructive uropathy. Unremarkable urinary bladder. 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 Blind-ending Hartmann's pouch noted. No disproportionate dilation of the small or large bowel loops. No evidence of abnormal bowel wall thickening or inflammatory changes. There is submucosal fat in the Hartmann's pouch. Vascular/Lymphatic: No ascites or  pneumoperitoneum. No abdominal or pelvic lymphadenopathy, by size criteria. No aneurysmal dilation of the major abdominal arteries. Reproductive: Normal size prostate. Symmetric seminal vesicles. Other: Umbilical surgical scar noted. 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 The soft tissues and abdominal wall are otherwise unremarkable. Musculoskeletal: No suspicious osseous lesions. There are mild multilevel degenerative changes in the visualized spine. IMPRESSION: 1. No acute inflammatory process identified within the abdomen or pelvis. 2. Since the prior study, there are new heterogeneous opacities in the right lung lower lobe, favored to represent aspiration versus infectious pneumonia. Correlate clinically. 3. Multiple other nonacute observations, as described above. Electronically Signed   By: Jules Schick M.D.   On: 06/12/2023 14:42    Scheduled Meds:  acetaminophen  650 mg Oral QHS   acidophilus  2 capsule Oral TID   aspirin EC  81 mg Oral Daily   atorvastatin  80 mg Oral QPM   clopidogrel  75 mg Oral Daily   ezetimibe  10 mg Oral Daily   fenofibrate  54 mg Oral Daily   heparin  5,000 Units Subcutaneous Q12H   pantoprazole (PROTONIX) IV  40 mg Intravenous Q24H   Continuous Infusions:  promethazine (PHENERGAN) injection (IM or IVPB) Stopped (06/13/23 1508)     LOS: 2 days    Time spent:   Debarah Crape, DO [facility]  To contact the attending physician between 7A-7P please use Epic Chat. To contact the covering physician during after hours 7P-7A, please review Amion.   06/14/2023, 8:47 AM   *This document has been created with the assistance of dictation software. Please excuse typographical errors. *

## 2023-06-14 NOTE — Telephone Encounter (Signed)
Patient only given 3 mont supply for last refill. Medication pend and sent to PCP [place], Donalee Citrin, NP for further refills.

## 2023-06-15 ENCOUNTER — Ambulatory Visit: Payer: Medicaid Other | Attending: Cardiology | Admitting: Cardiology

## 2023-06-15 ENCOUNTER — Inpatient Hospital Stay: Payer: Medicaid Other

## 2023-06-15 DIAGNOSIS — N179 Acute kidney failure, unspecified: Secondary | ICD-10-CM | POA: Diagnosis not present

## 2023-06-15 DIAGNOSIS — M79672 Pain in left foot: Secondary | ICD-10-CM | POA: Diagnosis not present

## 2023-06-15 DIAGNOSIS — M25572 Pain in left ankle and joints of left foot: Secondary | ICD-10-CM | POA: Diagnosis not present

## 2023-06-15 DIAGNOSIS — M7732 Calcaneal spur, left foot: Secondary | ICD-10-CM | POA: Diagnosis not present

## 2023-06-15 LAB — COMPREHENSIVE METABOLIC PANEL
ALT: 17 U/L (ref 0–44)
AST: 15 U/L (ref 15–41)
Albumin: 3.2 g/dL — ABNORMAL LOW (ref 3.5–5.0)
Alkaline Phosphatase: 43 U/L (ref 38–126)
Anion gap: 6 (ref 5–15)
BUN: 54 mg/dL — ABNORMAL HIGH (ref 6–20)
CO2: 19 mmol/L — ABNORMAL LOW (ref 22–32)
Calcium: 8.6 mg/dL — ABNORMAL LOW (ref 8.9–10.3)
Chloride: 112 mmol/L — ABNORMAL HIGH (ref 98–111)
Creatinine, Ser: 1.83 mg/dL — ABNORMAL HIGH (ref 0.61–1.24)
GFR, Estimated: 43 mL/min — ABNORMAL LOW (ref >60)
Glucose, Bld: 127 mg/dL — ABNORMAL HIGH (ref 70–99)
Potassium: 4.7 mmol/L (ref 3.5–5.1)
Sodium: 137 mmol/L (ref 135–145)
Total Bilirubin: 0.6 mg/dL (ref 0.0–1.2)
Total Protein: 6.4 g/dL — ABNORMAL LOW (ref 6.5–8.1)

## 2023-06-15 LAB — CBC WITH DIFFERENTIAL/PLATELET
Abs Immature Granulocytes: 0.02 10*3/uL (ref 0.00–0.07)
Basophils Absolute: 0 10*3/uL (ref 0.0–0.1)
Basophils Relative: 0 %
Eosinophils Absolute: 0.2 10*3/uL (ref 0.0–0.5)
Eosinophils Relative: 3 %
HCT: 38.3 % — ABNORMAL LOW (ref 39.0–52.0)
Hemoglobin: 13.2 g/dL (ref 13.0–17.0)
Immature Granulocytes: 0 %
Lymphocytes Relative: 21 %
Lymphs Abs: 1.6 10*3/uL (ref 0.7–4.0)
MCH: 27.2 pg (ref 26.0–34.0)
MCHC: 34.5 g/dL (ref 30.0–36.0)
MCV: 79 fL — ABNORMAL LOW (ref 80.0–100.0)
Monocytes Absolute: 1.1 10*3/uL — ABNORMAL HIGH (ref 0.1–1.0)
Monocytes Relative: 14 %
Neutro Abs: 4.9 10*3/uL (ref 1.7–7.7)
Neutrophils Relative %: 62 %
Platelets: 255 10*3/uL (ref 150–400)
RBC: 4.85 MIL/uL (ref 4.22–5.81)
RDW: 13.9 % (ref 11.5–15.5)
WBC: 7.9 10*3/uL (ref 4.0–10.5)
nRBC: 0 % (ref 0.0–0.2)

## 2023-06-15 LAB — MAGNESIUM: Magnesium: 2.6 mg/dL — ABNORMAL HIGH (ref 1.7–2.4)

## 2023-06-15 LAB — PHOSPHORUS: Phosphorus: 2.1 mg/dL — ABNORMAL LOW (ref 2.5–4.6)

## 2023-06-15 MED ORDER — CARVEDILOL 3.125 MG PO TABS: 3.1250 mg | ORAL_TABLET | Freq: Two times a day (BID) | ORAL | 0 refills | Status: DC

## 2023-06-15 NOTE — Plan of Care (Signed)

## 2023-06-15 NOTE — Progress Notes (Signed)
   06/15/23 1530  Spiritual Encounters  Type of Visit Initial  Care provided to: Pt and family  Conversation partners present during encounter Physician  Referral source Physician;Other (comment) (Pt is also the son of a Pt in ICU and making end of life decisions for her. I accompanied him to his mother's room but he wanted to be alone with her.)  Reason for visit Urgent spiritual support  OnCall Visit No  Spiritual Framework  Presenting Themes Goals in life/care;Values and beliefs;Caregiving needs;Significant life change;Impactful experiences and emotions  Family Stress Factors Family relationships (Mother in ICU)  Interventions  Spiritual Care Interventions Made Compassionate presence  Intervention Outcomes  Outcomes Awareness of support

## 2023-06-15 NOTE — Discharge Summary (Signed)
Physician Discharge Summary   Patient: Clarence [name] MRN: [id number] DOB: 04/12/1971  Admit date:     06/12/2023  Discharge date: 06/15/23  Discharge Physician: Debarah Crape   PCP: Caesar Bookman, NP   Recommendations at discharge:    Follow up with PCP for chronic medication management  Discharge Diagnoses: Principal Problem:   AKI (acute kidney injury) (HCC) Active Problems:   Gastroenteritis  Resolved Problems:   * No resolved hospital problems. *  Hospital Course: Clarence [name] is 53 y.o. male with hypertension, CKD stage IIIa, Crohn's disease status post partial colectomy and colostomy, hypertension, CAD with ischemic cardiomyopathy, chronic heart failure with reduced EF 45 to 50%, who presented with worsening nausea, vomiting, and increasing colostomy output for the last week.  Patient presented to the ED 1 week ago with similar complaints.  CT abdomen pelvis at that time showed moderately distended stomach with ingested material but no active inflammation or infection.  Patient reports the night prior to arrival vomiting woke him from sleep and he has been unable to lie down.  He denies any sick contacts.  He does not currently follow-up with gastroenterology but it has been more than 14 years since he has had a Crohn's flare.  On arrival to the ED blood pressure was 80/60, he was nontachycardic.  CT abdomen pelvis again revealed no acute inflammatory process, small heterogenous opacity in the right lower lobe favored to be aspiration pneumonia.  Blood work revealed AKI with creatinine of 4.2 compared to a baseline of 2.0, elevated BUN, hyperkalemia.  CBC with hemoconcentrated hemoglobin 15.9 and leukocytosis 13.5.  Patient received Lokelma and IV fluids. He was initiated on ceftriaxone but during infusion developed itchiness and macular rash on abdominal wall and bilateral thighs.  Ceftriaxone was then discontinued.  On reevaluation patient's nausea and vomiting appear to  be resolving, and leukocytosis resolved entirely.  His presentation was thought to be secondary to viral gastroenteritis.  Gradually his AKI also resolved to baseline. His stay was briefly complicated by chest pain which he reports is chronic.  An EKG was performed which revealed some ST elevations but after cardiology review, determined that these are known and chronic.  Troponin was without any significant elevation and chest pain resolved.  On 1/28 patient's vomiting and diarrhea resolved completely but he was complaining of some left-sided ankle pain with tenderness to palpation.  X-ray revealed a calcaneal spur but no acute pathology.  Patient will be discharging home today and will need to follow-up with his primary care physician within 2 weeks to review the medication changes made during this admission.  Assessment and Plan: Nausea vomiting - CT abdomen pelvis reassuring - Likely viral gastroenteritis, self resolved - Continue supportive care with Zofran/phenergan and as needed pain meds   CAD with chronic ischemic cardiomyopathy Chest pain, chronic - Chest pain occurred overnight on 1/25.  Patient was evaluated by overnight NP.  Patient reports recurring chest pain episodes at home and regularly takes nitroglycerin. - EKG performed which revealed some ST elevations in inferior leads - Has been reviewed with cardiology who reports these findings are chronic for this patient secondary to known occluded RCA - Troponin 22, with downtrend to 19 on repeat   Crohn's disease - Status post colectomy with colostomy, reports no flares in 14 years - CT reassuring. - Minimally elevated ESR - CRP elevated - Fecal calprotectin pending - GI PCR neg   AKI on CKD stage IIIa - Baseline appears close to  2.0, 4.29 on arrival - Acute worsening secondary to dehydration, prerenal injury - Status post IV fluids - Will hold off on additional IV fluids given CHF status.  Hold home BP meds CHF meds for  now - Trend CMP, creatinine improving now - Avoid nephrotoxic meds   Leukocytosis - See above. Suspect viral. - WBC has resolved to normal without antibiotic intervention -- CT abdominal pelvis showed incidental finding of small aspiration on the right lower lobe.  May be early pneumonitis.  Patient is without cough or fever.  hold off antibiotics given the risk of worsening of diarrhea as well as patient new symptoms of itchiness after ceftriaxone infusion.    Hyperkalemia - Lokelma ordered   Acute macular rash - Developed during ceftriaxone infusion, ceftriaxone discontinued - Hold off on further antibiotics as above - Ceftriaxone added to allergy list - Status post Solu-Medrol x 1, as needed Benadryl - Rash resolved now - No evidence of perioral edema, or shortness of breath   Hypertension -Blood pressures have been soft, hold all home antihypertensives for now        Consultants: n/a Procedures performed: n/a  Disposition: Home Diet recommendation:  Discharge Diet Orders (From admission, onward)     Start     Ordered   06/15/23 0000  Diet general        06/15/23 1725           Renal diet DISCHARGE MEDICATION: Allergies as of 06/15/2023       Reactions   Ceftriaxone Rash   Macular rash with itchness   Ibuprofen Other (See Comments)   History of GIB with ibuprofen    Sulfacetamide Other (See Comments)   Neomycin-bacitracin Zn-polymyx    Other reaction(s): Other (See Comments)   Spironolactone    Hyperkalemia        Medication List     PAUSE taking these medications    Entresto 24-26 MG Wait to take this until your doctor or other care provider tells you to start again. Generic drug: sacubitril-valsartan Take 1 tablet by mouth 2 (two) times daily.   spironolactone 25 MG tablet Wait to take this until your doctor or other care provider tells you to start again. Commonly known as: ALDACTONE Take 1 tablet (25 mg total) by mouth daily.        STOP taking these medications    Famotidine Orig St 10 MG tablet Generic drug: famotidine   isosorbide mononitrate 60 MG 24 hr tablet Commonly known as: IMDUR       TAKE these medications    acetaminophen 325 MG tablet Commonly known as: TYLENOL Take 650 mg by mouth at bedtime.   aspirin EC 81 MG tablet Take 1 tablet (81 mg total) by mouth daily. Swallow whole.   atorvastatin 80 MG tablet Commonly known as: LIPITOR Take 1 tablet (80 mg total) by mouth every evening.   carvedilol 3.125 MG tablet Commonly known as: COREG Take 1 tablet (3.125 mg total) by mouth 2 (two) times daily with a meal. What changed:  medication strength how much to take   clopidogrel 75 MG tablet Commonly known as: PLAVIX Take 1 tablet (75 mg total) by mouth daily.   ezetimibe 10 MG tablet Commonly known as: ZETIA Take 1 tablet (10 mg total) by mouth daily.   fenofibrate 48 MG tablet Commonly known as: Tricor Take 1 tablet (48 mg total) by mouth daily.   furosemide 20 MG tablet Commonly known as: LASIX Take 1 tablet (20  mg total) by mouth daily as needed. For weight gain of 3 lb in 24 hours or 5 lbs in one week   Jardiance 10 MG Tabs tablet Generic drug: empagliflozin TAKE 1 TABLET BY MOUTH EVERY DAY   metoCLOPramide 10 MG tablet Commonly known as: REGLAN Take 1 tablet (10 mg total) by mouth every 8 (eight) hours as needed.   nitroGLYCERIN 0.4 MG SL tablet Commonly known as: Nitrostat Place 1 tablet (0.4 mg total) under the tongue every 5 (five) minutes as needed for chest pain.   pantoprazole 40 MG tablet Commonly known as: PROTONIX Take 1 tablet (40 mg total) by mouth daily.   Praluent 150 MG/ML Soaj Generic drug: Alirocumab Inject 1 mL (150 mg total) into the skin every 14 (fourteen) days.   Wegovy 1 MG/0.5ML Soaj Generic drug: Semaglutide-Weight Management Inject 1 mg into the skin once a week.        Discharge Exam: Filed Weights   06/12/23 0924  Weight:  108.9 kg   General exam: Appears calm, uncomfortable appearing Respiratory system: No work of breathing, symmetric chest wall expansion Cardiovascular system: S1 & S2 heard, RRR.  Gastrointestinal system: Abdomen is nondistended, colostomy with yellow stool,  minimal abdominal tenderness Neuro: Alert and oriented. No focal neurological deficits. Extremities: Symmetric, expected ROM Skin: No rashes, lesions Psychiatry: Demonstrates appropriate judgement and insight. Mood & affect appropriate for situation.   Condition at discharge: stable  The results of significant diagnostics from this hospitalization (including imaging, microbiology, ancillary and laboratory) are listed below for reference.   Imaging Studies: DG Ankle 2 Views Left Result Date: 06/15/2023 CLINICAL DATA:  Left ankle/heel pain. EXAM: LEFT ANKLE - 2 VIEW COMPARISON:  None Available. FINDINGS: There is no evidence of fracture, dislocation, or joint effusion. No significant arthropathy identified. Small plantar calcaneal spur. No bony lesions or destruction. Soft tissues are unremarkable. IMPRESSION: Small plantar calcaneal spur. Electronically Signed   By: Irish Lack M.D.   On: 06/15/2023 14:05   DG Chest 1 View Result Date: 06/13/2023 CLINICAL DATA:  [phone] with CHF, nausea and vomiting. EXAM: CHEST  1 VIEW COMPARISON:  Portable chest yesterday at 3:11 p.m. FINDINGS: 7:02 a.m. The lungs are clear. There is mild cardiomegaly with normal vascular pattern. The mediastinum is normally outlined. The sulci are sharp. The thoracic cage intact. IMPRESSION: No evidence of acute chest disease. Mild cardiomegaly. Stable chest. Electronically Signed   By: Almira Bar M.D.   On: 06/13/2023 07:54   DG Chest Portable 1 View Result Date: 06/12/2023 CLINICAL DATA:  Infectious/inflammatory nodules in the partially imaged right lower lobe on same day CT abdomen and pelvis EXAM: PORTABLE CHEST 1 VIEW COMPARISON:  Same day CT abdomen and  pelvis, chest radiograph dated 06/03/2023 FINDINGS: Low lung volumes with bronchovascular crowding. No focal consolidations. No pleural effusion or pneumothorax. The heart size and mediastinal contours are within normal limits. No acute osseous abnormality. IMPRESSION: 1. Low lung volumes with bronchovascular crowding. No focal consolidations. 2. Right lower lung opacity described on same day CT is not well appreciated by chest radiograph. Electronically Signed   By: Agustin Cree M.D.   On: 06/12/2023 16:07   CT ABDOMEN PELVIS WO CONTRAST Result Date: 06/12/2023 CLINICAL DATA:  Abdominal pain, acute, nonlocalized EXAM: CT ABDOMEN AND PELVIS WITHOUT CONTRAST TECHNIQUE: Multidetector CT imaging of the abdomen and pelvis was performed following the standard protocol without IV contrast. RADIATION DOSE REDUCTION: This exam was performed according to the departmental dose-optimization program which includes automated  exposure control, adjustment of the mA and/or kV according to patient size and/or use of iterative reconstruction technique. COMPARISON:  CT scan abdomen and pelvis from 06/06/2023. FINDINGS: Lower chest: Since the prior study, there are new heterogeneous opacities in the right lung lower lobe, medially, favored to represent aspiration versus infective pneumonia. The lung bases are otherwise clear. No pleural effusion. The heart is normal in size. No pericardial effusion. Hepatobiliary: The liver is normal in size. Non-cirrhotic configuration. No suspicious mass. No intrahepatic or extrahepatic bile duct dilation. No calcified gallstones. Normal gallbladder wall thickness. No pericholecystic inflammatory changes. Pancreas: Unremarkable. No pancreatic ductal dilatation or surrounding inflammatory changes. Spleen: Within normal limits. No focal lesion. Adrenals/Urinary Tract: Adrenal glands are unremarkable. No suspicious renal mass within the limitations of this exam. There are 2 hypoattenuating structures in  the right kidney lower pole, characterized as simple cysts on the basis of prior exam. No nephroureterolithiasis or obstructive uropathy. Unremarkable urinary bladder. 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 Blind-ending Hartmann's pouch noted. No disproportionate dilation of the small or large bowel loops. No evidence of abnormal bowel wall thickening or inflammatory changes. There is submucosal fat in the Hartmann's pouch. Vascular/Lymphatic: No ascites or pneumoperitoneum. No abdominal or pelvic lymphadenopathy, by size criteria. No aneurysmal dilation of the major abdominal arteries. Reproductive: Normal size prostate. Symmetric seminal vesicles. Other: Umbilical surgical scar noted. 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 The soft tissues and abdominal wall are otherwise unremarkable. Musculoskeletal: No suspicious osseous lesions. There are mild multilevel degenerative changes in the visualized spine. IMPRESSION: 1. No acute inflammatory process identified within the abdomen or pelvis. 2. Since the prior study, there are new heterogeneous opacities in the right lung lower lobe, favored to represent aspiration versus infectious pneumonia. Correlate clinically. 3. Multiple other nonacute observations, as described above. Electronically Signed   By: Jules Schick M.D.   On: 06/12/2023 14:42   CT ABDOMEN PELVIS W CONTRAST Result Date: 06/06/2023 CLINICAL DATA:  Diarrhea, concern for Crohn's exacerbation. EXAM: CT ABDOMEN AND PELVIS WITH CONTRAST TECHNIQUE: Multidetector CT imaging of the abdomen and pelvis was performed using the standard protocol following bolus administration of intravenous contrast. RADIATION DOSE REDUCTION: This exam was performed according to the departmental dose-optimization program which includes automated exposure control, adjustment of the mA  and/or kV according to patient size and/or use of iterative reconstruction technique. CONTRAST:  80mL OMNIPAQUE IOHEXOL 300 MG/ML  SOLN COMPARISON:  CT abdomen and pelvis dated 09/10/2022. FINDINGS: Lower chest: No acute abnormality. Hepatobiliary: No focal liver abnormality is seen. No gallstones, gallbladder wall thickening, or biliary dilatation. Pancreas: Unremarkable. No pancreatic ductal dilatation or surrounding inflammatory changes. Spleen: Normal in size without focal abnormality. Adrenals/Urinary Tract: Adrenal glands are unremarkable. Kidneys are normal, without renal calculi, suspicious focal lesion, or hydronephrosis. Bladder is unremarkable. Stomach/Bowel: Stomach is moderately distended with ingested material. The patient is status post a prior colectomy with Hartmann's pouch and an ostomy in the right lower quadrant. A peristomal hernia containing multiple loops of small bowel appears similar to prior exam. No fat stranding within the hernia to suggest ischemia. There is submucosal fat in the duodenum which may reflect chronic changes of inflammatory bowel disease. No active inflammation. Vascular/Lymphatic: No significant vascular findings are present. No enlarged abdominal or pelvic lymph nodes. Reproductive: Prostate is unremarkable. Other: No free intraperitoneal fluid. Musculoskeletal: Degenerative changes are seen in the spine. IMPRESSION:  1. Moderately distended stomach with ingested material. 2. Submucosal fat in the duodenum may reflect chronic changes of inflammatory bowel disease. No active inflammation. Electronically Signed   By: Romona Curls M.D.   On: 06/06/2023 18:06   DG Chest 2 View Result Date: 06/03/2023 CLINICAL DATA:  Chest pain. EXAM: CHEST - 2 VIEW COMPARISON:  Radiographs 03/03/2023 and 02/23/2023.  CT 11/06/2022. FINDINGS: The heart size and mediastinal contours are normal. The lungs are clear. There is no pleural effusion or pneumothorax. No acute osseous findings are  identified. IMPRESSION: No evidence of acute cardiopulmonary process. Electronically Signed   By: Carey Bullocks M.D.   On: 06/03/2023 17:48    Microbiology: Results for orders placed or performed during the hospital encounter of 06/12/23  Resp panel by RT-PCR (RSV, Flu A&B, Covid) Anterior Nasal Swab     Status: None   Collection Time: 06/12/23  3:26 PM   Specimen: Anterior Nasal Swab  Result Value Ref Range Status   SARS Coronavirus 2 by RT PCR NEGATIVE NEGATIVE Final    Comment: (NOTE) SARS-CoV-2 target nucleic acids are NOT DETECTED.  The SARS-CoV-2 RNA is generally detectable in upper respiratory specimens during the acute phase of infection. The lowest concentration of SARS-CoV-2 viral copies this assay can detect is 138 copies/mL. A negative result does not preclude SARS-Cov-2 infection and should not be used as the sole basis for treatment or other patient management decisions. A negative result may occur with  improper specimen collection/handling, submission of specimen other than nasopharyngeal swab, presence of viral mutation(s) within the areas targeted by this assay, and inadequate number of viral copies(<138 copies/mL). A negative result must be combined with clinical observations, patient history, and epidemiological information. The expected result is Negative.  Fact Sheet for Patients:  BloggerCourse.com  Fact Sheet for Healthcare Providers:  SeriousBroker.it  This test is no t yet approved or cleared by the [place] FDA and  has been authorized for detection and/or diagnosis of SARS-CoV-2 by FDA under an Emergency Use Authorization (EUA). This EUA will remain  in effect (meaning this test can be used) for the duration of the COVID-19 declaration under Section 564(b)(1) of the Act, 21 U.S.C.section 360bbb-3(b)(1), unless the authorization is terminated  or revoked sooner.       Influenza A by PCR  NEGATIVE NEGATIVE Final   Influenza B by PCR NEGATIVE NEGATIVE Final    Comment: (NOTE) The Xpert Xpress SARS-CoV-2/FLU/RSV plus assay is intended as an aid in the diagnosis of influenza from Nasopharyngeal swab specimens and should not be used as a sole basis for treatment. Nasal washings and aspirates are unacceptable for Xpert Xpress SARS-CoV-2/FLU/RSV testing.  Fact Sheet for Patients: BloggerCourse.com  Fact Sheet for Healthcare Providers: SeriousBroker.it  This test is not yet approved or cleared by the [place] FDA and has been authorized for detection and/or diagnosis of SARS-CoV-2 by FDA under an Emergency Use Authorization (EUA). This EUA will remain in effect (meaning this test can be used) for the duration of the COVID-19 declaration under Section 564(b)(1) of the Act, 21 U.S.C. section 360bbb-3(b)(1), unless the authorization is terminated or revoked.     Resp Syncytial Virus by PCR NEGATIVE NEGATIVE Final    Comment: (NOTE) Fact Sheet for Patients: BloggerCourse.com  Fact Sheet for Healthcare Providers: SeriousBroker.it  This test is not yet approved or cleared by the [place] FDA and has been authorized for detection and/or diagnosis of SARS-CoV-2 by FDA under an Emergency Use Authorization (EUA). This  EUA will remain in effect (meaning this test can be used) for the duration of the COVID-19 declaration under Section 564(b)(1) of the Act, 21 U.S.C. section 360bbb-3(b)(1), unless the authorization is terminated or revoked.  Performed at [facility], [address] [place], [place] [address]   Blood Culture (routine x 2)     Status: None (Preliminary result)   Collection Time: 06/12/23  3:26 PM   Specimen: BLOOD  Result Value Ref Range Status   Specimen Description BLOOD BLOOD LEFT ARM  Final   Special Requests   Final    BOTTLES DRAWN  AEROBIC AND ANAEROBIC Blood Culture adequate volume   Culture   Final    NO GROWTH 3 DAYS Performed at [facility], [address] [place], [place] [address]    Report Status PENDING  Incomplete  Blood Culture (routine x 2)     Status: None (Preliminary result)   Collection Time: 06/12/23  3:26 PM   Specimen: BLOOD  Result Value Ref Range Status   Specimen Description BLOOD LEFT ANTECUBITAL  Final   Special Requests   Final    BOTTLES DRAWN AEROBIC AND ANAEROBIC Blood Culture adequate volume   Culture   Final    NO GROWTH 3 DAYS Performed at [facility], [address] [place], [place] [address]    Report Status PENDING  Incomplete  Gastrointestinal Panel by PCR , Stool     Status: None   Collection Time: 06/12/23  4:12 PM   Specimen: Stool  Result Value Ref Range Status   Campylobacter species NOT DETECTED NOT DETECTED Final   Plesimonas shigelloides NOT DETECTED NOT DETECTED Final   Salmonella species NOT DETECTED NOT DETECTED Final   Yersinia enterocolitica NOT DETECTED NOT DETECTED Final   Vibrio species NOT DETECTED NOT DETECTED Final   Vibrio cholerae NOT DETECTED NOT DETECTED Final   Enteroaggregative E coli (EAEC) NOT DETECTED NOT DETECTED Final   Enteropathogenic E coli (EPEC) NOT DETECTED NOT DETECTED Final   Enterotoxigenic E coli (ETEC) NOT DETECTED NOT DETECTED Final   Shiga like toxin producing E coli (STEC) NOT DETECTED NOT DETECTED Final   Shigella/Enteroinvasive E coli (EIEC) NOT DETECTED NOT DETECTED Final   Cryptosporidium NOT DETECTED NOT DETECTED Final   Cyclospora cayetanensis NOT DETECTED NOT DETECTED Final   Entamoeba histolytica NOT DETECTED NOT DETECTED Final   Giardia lamblia NOT DETECTED NOT DETECTED Final   Adenovirus F40/41 NOT DETECTED NOT DETECTED Final   Astrovirus NOT DETECTED NOT DETECTED Final   Norovirus GI/GII NOT DETECTED NOT DETECTED Final   Rotavirus A NOT DETECTED NOT DETECTED Final   Sapovirus (I, II, IV, and  V) NOT DETECTED NOT DETECTED Final    Comment: Performed at [facility], [address] [place], [place] [address]    Labs: CBC: Recent Labs  Lab 06/12/23 [phone] 06/13/23 0428 06/14/23 0941 06/15/23 0615  WBC 13.5* 9.1 10.7* 7.9  NEUTROABS  --   --  8.1* 4.9  HGB 15.9 14.1 15.0 13.2  HCT 46.7 41.3 43.9 38.3*  MCV 81.2 80.7 81.3 79.0*  PLT 302 256 292 255   Basic Metabolic Panel: Recent Labs  Lab 06/12/23 0928 06/12/23 2211 06/13/23 0428 06/14/23 0941 06/15/23 0615  NA 133* 130* 132* 135 137  K 5.7* 5.1 5.4* 4.9 4.7  CL 104 106 108 108 112*  CO2 19* 13* 15* 16* 19*  GLUCOSE 129* 149* 157* 132* 127*  BUN 56* 53* 56* 67* 54*  CREATININE  4.26* 3.29* 2.87* 2.67* 1.83*  CALCIUM 9.7 8.5* 8.5* 8.7* 8.6*  MG  --   --   --   --  2.6*  PHOS  --   --   --   --  2.1*   Liver Function Tests: Recent Labs  Lab 06/12/23 0928 06/14/23 0941 06/15/23 0615  AST 20 19 15   ALT 28 20 17   ALKPHOS 58 46 43  BILITOT 0.6 0.7 0.6  PROT 8.6* 6.8 6.4*  ALBUMIN 4.3 3.5 3.2*   CBG: No results for input(s): "GLUCAP" in the last 168 hours.  Discharge time spent: greater than 30 minutes.  Signed: Debarah Crape, DO [facility] 06/15/2023

## 2023-06-15 NOTE — Progress Notes (Signed)
Heart Failure Navigator Progress Note  Assessed for Heart & Vascular TOC clinic readiness.  Patient does not meet criteria due to Advanced Heart Failure Team patient of Laurey Morale, MD    Navigator will sign off at this time.  Roxy Horseman, RN, BSN [facility] Heart Failure Navigator Secure Chat Only

## 2023-06-15 NOTE — Progress Notes (Signed)
  Progress Note   Date: 06/15/2023  Patient Name: Clarence [name]        MRN#: [id number]   The diagnosis of hypovolemic shock    has been ruled out

## 2023-06-15 NOTE — Hospital Course (Signed)
Clarence [name] is 53 y.o. male with hypertension, CKD stage IIIa, Crohn's disease status post partial colectomy and colostomy, hypertension, CAD with ischemic cardiomyopathy, chronic heart failure with reduced EF 45 to 50%, who presented with worsening nausea, vomiting, and increasing colostomy output for the last week.  Patient presented to the ED 1 week ago with similar complaints.  CT abdomen pelvis at that time showed moderately distended stomach with ingested material but no active inflammation or infection.  Patient reports the night prior to arrival vomiting woke him from sleep and he has been unable to lie down.  He denies any sick contacts.  He does not currently follow-up with gastroenterology but it has been more than 14 years since he has had a Crohn's flare.  On arrival to the ED blood pressure was 80/60, he was nontachycardic.  CT abdomen pelvis again revealed no acute inflammatory process, small heterogenous opacity in the right lower lobe favored to be aspiration pneumonia.  Blood work revealed AKI with creatinine of 4.2 compared to a baseline of 2.0, elevated BUN, hyperkalemia.  CBC with hemoconcentrated hemoglobin 15.9 and leukocytosis 13.5.  Patient received Lokelma and IV fluids. He was initiated on ceftriaxone but during infusion developed itchiness and macular rash on abdominal wall and bilateral thighs.  Ceftriaxone was then discontinued.  On reevaluation patient's nausea and vomiting appear to be resolving, and leukocytosis resolved entirely.  His presentation was thought to be secondary to viral gastroenteritis.  Gradually his AKI also resolved to baseline. His stay was briefly complicated by chest pain which he reports is chronic.  An EKG was performed which revealed some ST elevations but after cardiology review, determined that these are known and chronic.  Troponin was without any significant elevation and chest pain resolved.  On 1/28 patient's vomiting and diarrhea resolved  completely but he was complaining of some left-sided ankle pain with tenderness to palpation.  X-ray revealed a calcaneal spur but no acute pathology.  Patient will be discharging home today and will need to follow-up with his primary care physician within 2 weeks to review the medication changes made during this admission.

## 2023-06-16 ENCOUNTER — Telehealth: Payer: Self-pay

## 2023-06-16 LAB — CALPROTECTIN, FECAL: Calprotectin, Fecal: 111 ug/g (ref 0–120)

## 2023-06-16 NOTE — Transitions of Care (Post Inpatient/ED Visit) (Signed)
   06/16/2023  Name: Clarence [name] MRN: [id number] DOB: [birth year]  Today's TOC FU Call Status: Today's TOC FU Call Status:: Unsuccessful Call (1st Attempt) Unsuccessful Call (1st Attempt) Date: 06/16/23  Attempted to reach the patient regarding the most recent Inpatient/ED visit. Patient was called in an Outreach attempt to offer VBCI  30-day TOC program. Pt is eligible for program due to potential risk for readmission and/or high utilization  Follow Up Plan: Additional outreach attempts will be made to reach the patient to complete the Transitions of Care (Post Inpatient/ED visit) call.   Jodelle Gross RN, BSN, CCM [facility] Manager Population Health Direct Dial: [phone]  Fax: [phone]

## 2023-06-17 ENCOUNTER — Emergency Department
Admission: EM | Admit: 2023-06-17 | Discharge: 2023-06-17 | Disposition: A | Discharge status: Home / Self Care | Payer: Medicaid Other | Attending: Emergency Medicine | Admitting: Emergency Medicine

## 2023-06-17 ENCOUNTER — Emergency Department: Payer: Medicaid Other

## 2023-06-17 ENCOUNTER — Other Ambulatory Visit: Payer: Self-pay

## 2023-06-17 DIAGNOSIS — I130 Hypertensive heart and chronic kidney disease with heart failure and stage 1 through stage 4 chronic kidney disease, or unspecified chronic kidney disease: Secondary | ICD-10-CM | POA: Diagnosis not present

## 2023-06-17 DIAGNOSIS — I509 Heart failure, unspecified: Secondary | ICD-10-CM | POA: Insufficient documentation

## 2023-06-17 DIAGNOSIS — R1013 Epigastric pain: Secondary | ICD-10-CM | POA: Insufficient documentation

## 2023-06-17 DIAGNOSIS — R112 Nausea with vomiting, unspecified: Secondary | ICD-10-CM | POA: Diagnosis not present

## 2023-06-17 DIAGNOSIS — N189 Chronic kidney disease, unspecified: Secondary | ICD-10-CM | POA: Insufficient documentation

## 2023-06-17 DIAGNOSIS — R1110 Vomiting, unspecified: Secondary | ICD-10-CM | POA: Diagnosis not present

## 2023-06-17 DIAGNOSIS — Z20822 Contact with and (suspected) exposure to covid-19: Secondary | ICD-10-CM | POA: Diagnosis not present

## 2023-06-17 DIAGNOSIS — R1010 Upper abdominal pain, unspecified: Secondary | ICD-10-CM | POA: Diagnosis present

## 2023-06-17 DIAGNOSIS — R1011 Right upper quadrant pain: Secondary | ICD-10-CM | POA: Diagnosis not present

## 2023-06-17 LAB — CBC WITH DIFFERENTIAL/PLATELET
Abs Immature Granulocytes: 0.06 10*3/uL (ref 0.00–0.07)
Basophils Absolute: 0.1 10*3/uL (ref 0.0–0.1)
Basophils Relative: 1 %
Eosinophils Absolute: 0.2 10*3/uL (ref 0.0–0.5)
Eosinophils Relative: 2 %
HCT: 41.7 % (ref 39.0–52.0)
Hemoglobin: 14.4 g/dL (ref 13.0–17.0)
Immature Granulocytes: 1 %
Lymphocytes Relative: 18 %
Lymphs Abs: 2.1 10*3/uL (ref 0.7–4.0)
MCH: 27.8 pg (ref 26.0–34.0)
MCHC: 34.5 g/dL (ref 30.0–36.0)
MCV: 80.5 fL (ref 80.0–100.0)
Monocytes Absolute: 1.4 10*3/uL — ABNORMAL HIGH (ref 0.1–1.0)
Monocytes Relative: 12 %
Neutro Abs: 8.2 10*3/uL — ABNORMAL HIGH (ref 1.7–7.7)
Neutrophils Relative %: 66 %
Platelets: 379 10*3/uL (ref 150–400)
RBC: 5.18 MIL/uL (ref 4.22–5.81)
RDW: 14.1 % (ref 11.5–15.5)
WBC: 12.1 10*3/uL — ABNORMAL HIGH (ref 4.0–10.5)
nRBC: 0 % (ref 0.0–0.2)

## 2023-06-17 LAB — COMPREHENSIVE METABOLIC PANEL
ALT: 43 U/L (ref 0–44)
AST: 42 U/L — ABNORMAL HIGH (ref 15–41)
Albumin: 4.2 g/dL (ref 3.5–5.0)
Alkaline Phosphatase: 65 U/L (ref 38–126)
Anion gap: 8 (ref 5–15)
BUN: 39 mg/dL — ABNORMAL HIGH (ref 6–20)
CO2: 19 mmol/L — ABNORMAL LOW (ref 22–32)
Calcium: 9.6 mg/dL (ref 8.9–10.3)
Chloride: 109 mmol/L (ref 98–111)
Creatinine, Ser: 1.77 mg/dL — ABNORMAL HIGH (ref 0.61–1.24)
GFR, Estimated: 45 mL/min — ABNORMAL LOW (ref >60)
Glucose, Bld: 106 mg/dL — ABNORMAL HIGH (ref 70–99)
Potassium: 5.3 mmol/L — ABNORMAL HIGH (ref 3.5–5.1)
Sodium: 136 mmol/L (ref 135–145)
Total Bilirubin: 0.7 mg/dL (ref 0.0–1.2)
Total Protein: 7.8 g/dL (ref 6.5–8.1)

## 2023-06-17 LAB — RESP PANEL BY RT-PCR (RSV, FLU A&B, COVID)  RVPGX2
Influenza A by PCR: NEGATIVE
Influenza B by PCR: NEGATIVE
Resp Syncytial Virus by PCR: NEGATIVE
SARS Coronavirus 2 by RT PCR: NEGATIVE

## 2023-06-17 LAB — CULTURE, BLOOD (ROUTINE X 2)
Culture: NO GROWTH
Culture: NO GROWTH
Special Requests: ADEQUATE
Special Requests: ADEQUATE

## 2023-06-17 LAB — LIPASE, BLOOD: Lipase: 177 U/L — ABNORMAL HIGH (ref 11–51)

## 2023-06-17 MED ORDER — ONDANSETRON 4 MG PO TBDP
4.0000 mg | ORAL_TABLET | Freq: Three times a day (TID) | ORAL | 0 refills | Status: DC | PRN
Start: 2023-06-17 — End: 2023-10-20

## 2023-06-17 MED ORDER — PANTOPRAZOLE SODIUM 40 MG IV SOLR
40.0000 mg | Freq: Once | INTRAVENOUS | Status: AC
Start: 2023-06-17 — End: 2023-06-17
  Administered 2023-06-17: 40 mg via INTRAVENOUS
  Filled 2023-06-17: qty 10

## 2023-06-17 MED ORDER — ALUM & MAG HYDROXIDE-SIMETH 200-200-20 MG/5ML PO SUSP
30.0000 mL | Freq: Once | ORAL | Status: AC
  Administered 2023-06-17: 30 mL via ORAL
  Filled 2023-06-17: qty 30

## 2023-06-17 MED ORDER — FAMOTIDINE 20 MG PO TABS: 20.0000 mg | ORAL_TABLET | Freq: Two times a day (BID) | ORAL | 0 refills | Status: DC

## 2023-06-17 MED ORDER — ALUMINUM-MAGNESIUM-SIMETHICONE 200-200-20 MG/5ML PO SUSP: 30.0000 mL | Freq: Three times a day (TID) | ORAL | 0 refills | Status: DC

## 2023-06-17 MED ORDER — SODIUM CHLORIDE 0.9 % IV BOLUS
1000.0000 mL | Freq: Once | INTRAVENOUS | Status: AC
  Administered 2023-06-17: 1000 mL via INTRAVENOUS

## 2023-06-17 MED ORDER — ONDANSETRON HCL 4 MG/2ML IJ SOLN
4.0000 mg | Freq: Once | INTRAMUSCULAR | Status: AC
  Administered 2023-06-17: 4 mg via INTRAVENOUS
  Filled 2023-06-17: qty 2

## 2023-06-17 MED ORDER — KETOROLAC TROMETHAMINE 15 MG/ML IJ SOLN
15.0000 mg | Freq: Once | INTRAMUSCULAR | Status: AC
Start: 2023-06-17 — End: 2023-06-17
  Administered 2023-06-17: 15 mg via INTRAVENOUS
  Filled 2023-06-17: qty 1

## 2023-06-17 NOTE — ED Notes (Signed)
Pt verbalizes understanding of discharge instructions. Opportunity for questioning and answers were provided. Pt discharged from ED to home.   ? ?

## 2023-06-17 NOTE — ED Notes (Signed)
Pt noted getting soda and snacks from lobby vending machine and then grabbing an emesis bag from the front desk

## 2023-06-17 NOTE — ED Notes (Signed)
Pt passed PO challenge.

## 2023-06-17 NOTE — ED Provider Notes (Signed)
[facility] Provider Note    Event Date/Time   First MD Initiated Contact with Patient 06/17/23 0930     (approximate)   History   Chief Complaint: Emesis   HPI  Clarence [name] is a 53 y.o. male with a history of heart failure, Crohn's disease, hypertension, CKD, who comes the ED complaining of upper abdominal pain, nausea and vomiting, worse with eating.  Reports this has been present for the past few weeks, got admitted a couple days ago to the hospital, and was discharged again within a few days.  However, on returning home his symptoms are worsening.  Minimal oral intake over the last 3 days.  Feels lightheaded when he stands up, denies syncope or other falls.  Reviewed outside records, including hospital discharge summary on June 15, 2023 noting patient was treated for suspected viral gastroenteritis which seem to have resolved, along with dehydration and AKI.        Physical Exam   Triage Vital Signs: ED Triage Vitals  Encounter Vitals Group     BP 06/17/23 0440 121/87     Systolic BP Percentile --      Diastolic BP Percentile --      Pulse Rate 06/17/23 0440 97     Resp 06/17/23 0440 18     Temp 06/17/23 0440 98.7 F (37.1 C)     Temp Source 06/17/23 0440 Oral     SpO2 06/17/23 0440 98 %     Weight 06/17/23 0439 239 lb 13.8 oz (108.8 kg)     Height 06/17/23 0439 5\' 10"  (1.778 m)     Head Circumference --      Peak Flow --      Pain Score 06/17/23 0439 0     Pain Loc --      Pain Education --      Exclude from Growth Chart --     Most recent vital signs: Vitals:   06/17/23 0440 06/17/23 1203  BP: 121/87 120/78  Pulse: 97 71  Resp: 18 18  Temp: 98.7 F (37.1 C)   SpO2: 98% 97%    General: Awake, no distress.  CV:  Good peripheral perfusion.  Regular rate and rhythm Resp:  Normal effort.  Clear to auscultation bilaterally Abd:  No distention.  Soft with epigastric and right upper quadrant tenderness Other:  No lower  extremity edema.  Dry oral mucosa   ED Results / Procedures / Treatments   Labs (all labs ordered are listed, but only abnormal results are displayed) Labs Reviewed  CBC WITH DIFFERENTIAL/PLATELET - Abnormal; Notable for the following components:      Result Value   WBC 12.1 (*)    Neutro Abs 8.2 (*)    Monocytes Absolute 1.4 (*)    All other components within normal limits  COMPREHENSIVE METABOLIC PANEL - Abnormal; Notable for the following components:   Potassium 5.3 (*)    CO2 19 (*)    Glucose, Bld 106 (*)    BUN 39 (*)    Creatinine, Ser 1.77 (*)    AST 42 (*)    GFR, Estimated 45 (*)    All other components within normal limits  LIPASE, BLOOD - Abnormal; Notable for the following components:   Lipase 177 (*)    All other components within normal limits  RESP PANEL BY RT-PCR (RSV, FLU A&B, COVID)  [id number]     EKG    RADIOLOGY Ultrasound right upper quadrant interpreted by  me, appears normal.  Radiology report reviewed   PROCEDURES:  Procedures   MEDICATIONS ORDERED IN ED: Medications  alum & mag hydroxide-simeth (MAALOX/MYLANTA) 200-200-20 MG/5ML suspension 30 mL (has no administration in time range)  sodium chloride 0.9 % bolus 1,000 mL (1,000 mLs Intravenous New Bag/Given 06/17/23 1042)  ondansetron (ZOFRAN) injection 4 mg (4 mg Intravenous Given 06/17/23 1042)  ketorolac (TORADOL) 15 MG/ML injection 15 mg (15 mg Intravenous Given 06/17/23 1043)  pantoprazole (PROTONIX) injection 40 mg (40 mg Intravenous Given 06/17/23 1043)     IMPRESSION / MDM / ASSESSMENT AND PLAN / ED COURSE  I reviewed the triage vital signs and the nursing notes.  DDx: Pancreatitis, cholecystitis, choledocholithiasis, dehydration, AKI, electrolyte derangement, COVID, influenza  Patient's presentation is most consistent with acute presentation with potential threat to life or bodily function.  Patient presents with persistent abdominal pain with tenderness of her right upper  quadrant, along with nausea vomiting and poor oral intake.  Appears dehydrated.  Vital signs unremarkable.  Initial lab panel shows lipase mildly elevated compared to recent values.  Creatinine is 1.8, consistent with chronic baseline.  Rest of the metabolic panel is consistent with a degree of dehydration and starvation.  Will give IV fluids, Toradol Protonix Zofran.  Will obtain ultrasound right upper quadrant.   ----------------------------------------- 3:43 PM on 06/17/2023 ----------------------------------------- Feeling much better, pain resolved, abdomen nontender.  Tolerating oral intake.  Stable for discharge      FINAL CLINICAL IMPRESSION(S) / ED DIAGNOSES   Final diagnoses:  Epigastric pain     Rx / DC Orders   ED Discharge Orders          Ordered    aluminum-magnesium hydroxide-simethicone (MAALOX) 200-200-20 MG/5ML SUSP  3 times daily before meals & bedtime        06/17/23 1542    ondansetron (ZOFRAN-ODT) 4 MG disintegrating tablet  Every 8 hours PRN        06/17/23 1542    famotidine (PEPCID) 20 MG tablet  2 times daily        06/17/23 1542             Note:  This document was prepared using Dragon voice recognition software and may include unintentional dictation errors.   Sharman Cheek, MD 06/17/23 [phone]

## 2023-06-17 NOTE — ED Triage Notes (Signed)
Pt reports n/v for the past few weeks, pt states he was admitted for same a few days ago. Symptoms improved for a day then came back. Pt denies abd pain.

## 2023-06-23 ENCOUNTER — Ambulatory Visit: Payer: Medicaid Other | Admitting: Cardiology

## 2023-06-24 ENCOUNTER — Ambulatory Visit (HOSPITAL_BASED_OUTPATIENT_CLINIC_OR_DEPARTMENT_OTHER): Payer: Medicaid Other | Admitting: Cardiology

## 2023-06-24 ENCOUNTER — Other Ambulatory Visit
Admission: RE | Admit: 2023-06-24 | Discharge: 2023-06-24 | Disposition: A | Discharge status: Home / Self Care | Payer: Medicaid Other | Source: Ambulatory Visit | Attending: Cardiology | Admitting: Cardiology

## 2023-06-24 VITALS — BP 103/81 | HR 94 | Wt 233.0 lb

## 2023-06-24 DIAGNOSIS — Z7985 Long-term (current) use of injectable non-insulin antidiabetic drugs: Secondary | ICD-10-CM | POA: Insufficient documentation

## 2023-06-24 DIAGNOSIS — I130 Hypertensive heart and chronic kidney disease with heart failure and stage 1 through stage 4 chronic kidney disease, or unspecified chronic kidney disease: Secondary | ICD-10-CM | POA: Insufficient documentation

## 2023-06-24 DIAGNOSIS — Z7902 Long term (current) use of antithrombotics/antiplatelets: Secondary | ICD-10-CM | POA: Diagnosis not present

## 2023-06-24 DIAGNOSIS — I5042 Chronic combined systolic (congestive) and diastolic (congestive) heart failure: Secondary | ICD-10-CM

## 2023-06-24 DIAGNOSIS — E669 Obesity, unspecified: Secondary | ICD-10-CM | POA: Diagnosis not present

## 2023-06-24 DIAGNOSIS — E781 Pure hyperglyceridemia: Secondary | ICD-10-CM | POA: Diagnosis not present

## 2023-06-24 DIAGNOSIS — I5022 Chronic systolic (congestive) heart failure: Secondary | ICD-10-CM | POA: Insufficient documentation

## 2023-06-24 DIAGNOSIS — K5090 Crohn's disease, unspecified, without complications: Secondary | ICD-10-CM | POA: Diagnosis not present

## 2023-06-24 DIAGNOSIS — Z79899 Other long term (current) drug therapy: Secondary | ICD-10-CM | POA: Insufficient documentation

## 2023-06-24 DIAGNOSIS — I25110 Atherosclerotic heart disease of native coronary artery with unstable angina pectoris: Secondary | ICD-10-CM | POA: Diagnosis not present

## 2023-06-24 DIAGNOSIS — K50918 Crohn's disease, unspecified, with other complication: Secondary | ICD-10-CM

## 2023-06-24 DIAGNOSIS — R42 Dizziness and giddiness: Secondary | ICD-10-CM | POA: Diagnosis not present

## 2023-06-24 DIAGNOSIS — Z7984 Long term (current) use of oral hypoglycemic drugs: Secondary | ICD-10-CM | POA: Diagnosis not present

## 2023-06-24 DIAGNOSIS — I429 Cardiomyopathy, unspecified: Secondary | ICD-10-CM | POA: Diagnosis not present

## 2023-06-24 DIAGNOSIS — N183 Chronic kidney disease, stage 3 unspecified: Secondary | ICD-10-CM | POA: Diagnosis not present

## 2023-06-24 LAB — SEDIMENTATION RATE: Sed Rate: 12 mm/h (ref 0–20)

## 2023-06-24 LAB — BASIC METABOLIC PANEL
Anion gap: 9 (ref 5–15)
BUN: 43 mg/dL — ABNORMAL HIGH (ref 6–20)
CO2: 19 mmol/L — ABNORMAL LOW (ref 22–32)
Calcium: 9.8 mg/dL (ref 8.9–10.3)
Chloride: 107 mmol/L (ref 98–111)
Creatinine, Ser: 1.85 mg/dL — ABNORMAL HIGH (ref 0.61–1.24)
GFR, Estimated: 43 mL/min — ABNORMAL LOW (ref >60)
Glucose, Bld: 119 mg/dL — ABNORMAL HIGH (ref 70–99)
Potassium: 5 mmol/L (ref 3.5–5.1)
Sodium: 135 mmol/L (ref 135–145)

## 2023-06-24 LAB — BRAIN NATRIURETIC PEPTIDE: B Natriuretic Peptide: 24.7 pg/mL (ref 0.0–100.0)

## 2023-06-24 LAB — C-REACTIVE PROTEIN: CRP: 0.6 mg/dL (ref <1.0)

## 2023-06-24 MED ORDER — ISOSORBIDE MONONITRATE ER 30 MG PO TB24: 30.0000 mg | ORAL_TABLET | Freq: Every day | ORAL | 3 refills | Status: DC

## 2023-06-24 MED ORDER — SPIRONOLACTONE 25 MG PO TABS: 25.0000 mg | ORAL_TABLET | Freq: Every day | ORAL | 3 refills | Status: DC

## 2023-06-24 MED ORDER — SACUBITRIL-VALSARTAN 24-26 MG PO TABS: 1.0000 | ORAL_TABLET | Freq: Two times a day (BID) | ORAL | 3 refills | Status: DC

## 2023-06-24 NOTE — Patient Instructions (Addendum)
 Labs done today. We will contact you only if your labs are abnormal.  RESTART Entresto  24-26mg  (1 tablet) by mouth 2 times daily.  RESTART Imdur  30mg  (1 tablet) by mouth daily.  RESTART Spironolactone  25mg  (1 tablet) bym outh daily.   No other medication changes were made. Please continue all current medications as prescribed.  You have been referred to GI. They will contact you to schedule an appointment.   Your physician recommends that you schedule a follow-up appointment soon for an echo,  10 days for a lab only appointment and in 1 month with [place].   Your physician has requested that you have an echocardiogram. Echocardiography is a painless test that uses sound waves to create images of your heart. It provides your doctor with information about the size and shape of your heart and how well your heart's chambers and valves are working. This procedure takes approximately one hour. There are no restrictions for this procedure. Please do NOT wear cologne, perfume, aftershave, or lotions (deodorant is allowed). Please arrive 15 minutes prior to your appointment time.  Please note: We ask at that you not bring children with you during ultrasound (echo/ vascular) testing. Due to room size and safety concerns, children are not allowed in the ultrasound rooms during exams. Our front office staff cannot provide observation of children in our lobby area while testing is being conducted. An adult accompanying a patient to their appointment will only be allowed in the ultrasound room at the discretion of the ultrasound technician under special circumstances. We apologize for any inconvenience.  If you have any questions or concerns before your next appointment please send us  a message through [place] or call our office at [phone].    TO LEAVE A MESSAGE FOR THE NURSE SELECT OPTION 2, PLEASE LEAVE A MESSAGE INCLUDING: YOUR NAME DATE OF BIRTH CALL BACK NUMBER REASON FOR CALL**this is important  as we prioritize the call backs  YOU WILL RECEIVE A CALL BACK THE SAME DAY AS LONG AS YOU CALL BEFORE 4:00 PM   Do the following things EVERYDAY: Weigh yourself in the morning before breakfast. Write it down and keep it in a log. Take your medicines as prescribed Eat low salt foods--Limit salt (sodium) to 2000 mg per day.  Stay as active as you can everyday Limit all fluids for the day to less than 2 liters   At the [facility], you and your health needs are our priority. As part of our continuing mission to provide you with exceptional heart care, we have created designated Provider Care Teams. These Care Teams include your primary Cardiologist (physician) and Advanced Practice Providers (APPs- Physician Assistants and Nurse Practitioners) who all work together to provide you with the care you need, when you need it.   You may see any of the following providers on your designated Care Team at your next follow up: Dr Toribio Fuel Dr Ezra Shuck Dr. Ria Gardenia Greig Lenetta, NP Caffie Shed, [place] [facility], [place] Beckey Coe, NP Tinnie Redman, PharmD   Please be sure to bring in all your medications bottles to every appointment.    Thank you for choosing [facility]

## 2023-06-24 NOTE — Progress Notes (Signed)
 PCP: Leonarda Roxan BROCKS, NP HF Cardiology: Dr Rolan  Chief complaint: CHF  53 y.o. with history of CAD, chronic systolic CHF, Crohns disease, CKD stage 3, and HTN was referred by Dr. Mady to HF clinic for evaluation of CHF.  Patient was admitted in 3/23 with newly found cardiomyopathy, CHF and atypical PNA.  Echo in 3/23 showed EF 30-35%, moderate LVH, normal RV. LHC showed occluded RCA with collaterals, 60% mLAD, 65% mLCx, 90% mid OM1.  RHC showed elevated filling pressures and low CI (2).  Patient was admitted again in 4/23 with CHF. He was admitted in 5/23 with hypertensive urgency and chest pain, CTA abdomen/pelvis did not show renal artery stenosis. He was admitted again in 6/23 with hypertensive urgency.  He quit smoking after 3/23 admission.  Rarely drinks ETOH and no drugs.  No strong family history of CAD or cardiomyopathy.    Sleep study was done, not suggestive of OSA.   He was admitted in 4/24 with chest pain => no evidence for ACS.  He had severe watery diarrhea via his ostomy and was dehydrated. Meds were held. Echo in 4/24 was improved, showing EF 45-50%, inferior and inferolateral hypokinesis, normal RV.   Patient continued to have chest pain episodes so LHC/RHC was done in 5/24 showing occluded mRCA with collaterals, 90% stenosis with severe diffuse disease in OM1. 50% mLAD, 60% proximal and mid LCx (unchanged from prior, no interventional target); normal filling pressures and preserved cardiac output.   Patient went to the ER 11/06/22 with atypical chest pain, negative workup.  He was in the ER again 11/10/22 with lightheadedness.  BP stable in ER but he said that SBP was as low as 80s at home. BP-active meds were adjusted.   Patient had progressive angina with repeat cath in 10/24.  This showed similar severe diffuse disease in the OM1, he had DES to OM1.  Other disease unchanged, CTO of RCA, 50% mid LAD.   Clarence [name] was admitted in 1/25 with atypical chest pain, thought to be  noncardiac.  He was admitted again in 1/25 with nausea, vomiting, and diarrhea.  BP was low; Entresto , spironolactone , and Imdur  were stopped and Coreg  was decreased to 3.125 mg bid. He is off ranolazine  due to side effects.   Patient returns for followup of CHF and CAD.  Weight is down 7 lbs.  Patient continues to report watery stool and also abdominal discomfort/bloating.  He does not have a GI appointment.  He has a chronic pattern of atypical chest pain.  This seems to be relatively mild, is not exertional, and can happen at any time with no trigger. No dyspnea walking on flat ground.  No orthopnea/PND.  No lightheadedness.   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 Crohns disease: s/p colectomy with ostomy.  2. CKD stage 3: History of hyperkalemia.  3. HTN: CT abdomen/pelvis with no evidence for renal artery stenosis.  4. Pulmonary nodules: Stable on last CT.  5. CAD: LHC 3/23  with CTO mid RCA with collaterals, 60% mLAD, 65% mLCx, 90% mid OM1.  - LHC (5/24): occluded mRCA with collaterals, 90% stenosis with severe diffuse disease in OM1. 50% mLAD, 60% proximal and mid LCx (unchanged from prior, no interventional target) - LHC (10/24): Occluded mRCA with collaterals, 90% long/diffuse stenosis in OM1, 50% mLAD.  He was treated with DES to OM1.  6. Chronic systolic CHF: Ischemic cardiomyopathy (cannot fully rule out nonischemic component).  - Echo (3/23): EF 30-35%, moderate LVH, normal RV. - RHC (3/23): mean RA 16, PA  67/30 mean 42, mean PCWP 25, CI 2, PVR 4 - Echo (5/23): EF 35-40%, global HK, moderate LVH, RV normal, mild Clarence - Echo (4/24): EF 45-50%, inferior and inferolateral hypokinesis, normal RV.  - RHC (5/24): mean RA 4, PA 36/9, mean PCWP 10, CI 2.36, PVR 1.7 WU 7. Sleep study negative for OSA.  8. Peripheral arterial dopplers (8/24): Normal.   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   FH: Uncles with heart trouble  ROS: All systems reviewed and negative except as per HPI.   Current Outpatient Medications  Medication Sig Dispense Refill   acetaminophen  (TYLENOL ) 325 MG tablet Take 650 mg by mouth at bedtime.     Alirocumab  (PRALUENT ) 150 MG/ML SOAJ Inject 1 mL (150 mg total) into the skin every 14 (fourteen) days. 2 mL 2   aluminum -magnesium  hydroxide-simethicone  (MAALOX) 200-200-20 MG/5ML SUSP Take 30 mLs by mouth 4 (four) times daily -  before meals and at bedtime. 355 mL 0   aspirin  EC 81 MG tablet Take 1 tablet (81 mg total) by mouth daily. Swallow whole. 30 tablet 4   atorvastatin  (LIPITOR ) 80 MG tablet Take 1 tablet (80 mg total) by mouth every evening. 90 tablet 3   carvedilol  (COREG ) 3.125 MG tablet Take 1 tablet (3.125 mg total) by mouth 2 (two) times daily with a meal. 60 tablet 0   clopidogrel  (PLAVIX ) 75 MG tablet Take 1 tablet (75 mg total) by mouth daily. 30 tablet 11   empagliflozin  (JARDIANCE ) 10 MG TABS tablet TAKE 1 TABLET BY MOUTH EVERY DAY 90 tablet 1   ezetimibe  (ZETIA ) 10 MG tablet Take 1 tablet (10 mg total) by mouth daily. 90 tablet 3   famotidine  (PEPCID ) 20 MG tablet Take 1 tablet (20 mg total) by mouth 2 (two) times daily. 60 tablet 0   fenofibrate  (TRICOR ) 48 MG tablet Take 1 tablet (48 mg total) by mouth daily. 30 tablet 8   furosemide  (LASIX ) 20 MG tablet Take 1 tablet (20 mg total) by mouth daily as needed. For weight gain of 3 lb in 24 hours or 5 lbs in one week 90 tablet 1   isosorbide   mononitrate (IMDUR ) 30 MG 24 hr tablet Take 1 tablet (30 mg total) by mouth daily. 90 tablet 3   metoCLOPramide  (REGLAN ) 10 MG tablet Take 1 tablet (10 mg total) by mouth every 8 (eight) hours as needed. 12 tablet 0   nitroGLYCERIN  (NITROSTAT ) 0.4 MG SL tablet Place 1 tablet (0.4 mg total) under the tongue every 5 (five) minutes as needed for chest pain. 25 tablet 3   ondansetron  (ZOFRAN -ODT) 4 MG disintegrating tablet Take 1 tablet (4 mg total) by mouth every 8 (eight) hours as needed for nausea or vomiting. 20 tablet 0   pantoprazole  (PROTONIX ) 40 MG tablet Take 1 tablet (40 mg total) by mouth daily. 30 tablet 11   Semaglutide -Weight Management (  WEGOVY ) 1 MG/0.5ML SOAJ Inject 1 mg into the skin once a week. 2 mL 0   sacubitril -valsartan  (ENTRESTO ) 24-26 MG Take 1 tablet by mouth 2 (two) times daily. 180 tablet 3   spironolactone  (ALDACTONE ) 25 MG tablet Take 1 tablet (25 mg total) by mouth daily. 90 tablet 3   No current facility-administered medications for this visit.   Wt Readings from Last 3 Encounters:  06/24/23 233 lb (105.7 kg)  06/17/23 239 lb 13.8 oz (108.8 kg)  06/12/23 240 lb (108.9 kg)   BP 103/81   Pulse 94   Wt 233 lb (105.7 kg)   SpO2 98%   BMI 33.43 kg/m  General: NAD Neck: No JVD, no thyromegaly or thyroid  nodule.  Lungs: Clear to auscultation bilaterally with normal respiratory effort. CV: Nondisplaced PMI.  Heart regular S1/S2, no S3/S4, no murmur.  No peripheral edema.  No carotid bruit.  Normal pedal pulses.  Abdomen: Soft, nontender, no hepatosplenomegaly, no distention. Ostomy present.  Skin: Intact without lesions or rashes.  Neurologic: Alert and oriented x 3.  Psych: Normal affect. Extremities: No clubbing or cyanosis.  HEENT: Normal.   Assessment/Plan: 1. Chronic systolic CHF: Suspect primarily ischemic cardiomyopathy but cannot rule out component of nonischemic cardiomyopathy. RHC in 3/23 with elevated filling pressures and low CI, 2. Echo in 5/23 with  EF 35-40%, normal RV.  Echo in 4/24 with EF up to 45-50%, inferior/inferolateral hypokinesis. NYHA class II, not volume overloaded on exam. GDMT has been limited by lightheadedness and CKD.  - Restart Entresto  24/26 bid.  BMET/BNP today, BMET in 10 days.  - Continue lower dose Coreg  3.125 mg bid.   - Continue Jardiance  10 mg daily.  - Restart spironolactone  12.5 mg daily unless labs today show elevated K.  - EF has been out of ICD range. I am going to arrange for repeat echo.  - He does not appear to need Lasix .  2. CAD: Cath in 3/23 with CTO mid RCA with collaterals, 60% mLAD, 65% mLCx, 90% mid OM1.  Cath in 5/24 was very similar with occluded mRCA with collaterals, 90% stenosis with severe diffuse disease in OM1. 50% mLAD, 60% proximal and mid LCx.  He continued to have frequent atypical chest pain, with progressive symptoms in 10/24, he was taken back for cath.  Images were very similar to 5/24 => this time he had DES to OM1.  He has chronic atypical chest pain, no change to pattern.  - Restart Imdur  at 30 mg daily.   - Continue Coreg  3.125 mg bid.  - He stopped ranolazine  due to side effects.  - Continue ASA 81 and Plavix  75 daily.  - Continue atorvastatin , Praluent  and Zetia .  Good lipids in 1/25.   - Continue fenofibrate  with elevated triglycerides.  3. CKD: Stage 3.  Follow closely, BMET today.   4. Obesity: Continue semaglutide .  5. Crohns disease: Patient has ostomy.  He does not have GI followup.  Has had persistent watery stool with abdominal discomfort/bloating.  - Check ESR, CRP.  - I will refer to GI for evaluation.   Followup with APP in 1 month.   I spent 31 minutes reviewing records, interviewing/examining patient, and managing orders.   Clarence [name] 06/24/2023

## 2023-07-15 ENCOUNTER — Telehealth: Payer: Self-pay | Admitting: Pharmacist

## 2023-07-15 MED ORDER — WEGOVY 0.5 MG/0.5ML ~~LOC~~ SOAJ: 0.5000 mg | SUBCUTANEOUS | 3 refills | Status: DC

## 2023-07-15 NOTE — Telephone Encounter (Signed)
 Patient having bloating and nausea with [id number]. Reduced dose to 0.5 mg weekly. If this does not resolve issues, will need to discontinue Wegovy.

## 2023-07-21 ENCOUNTER — Telehealth: Payer: Self-pay | Admitting: Pharmacist

## 2023-07-21 ENCOUNTER — Telehealth: Payer: Self-pay | Admitting: Family

## 2023-07-21 NOTE — Progress Notes (Unsigned)
 Advanced Heart Failure Clinic Note    PCP: Ngetich, Donalee Citrin, NP HF Cardiology: Dr Shirlee Latch  Chief complaint: CHF  53 y.o. with history of CAD, chronic systolic CHF, Crohns disease, CKD stage 3, and HTN was referred by Dr. Okey Dupre to HF clinic for evaluation of CHF.  Patient was admitted in 3/23 with newly found cardiomyopathy, CHF and atypical PNA.  Echo in 3/23 showed EF 30-35%, moderate LVH, normal RV. LHC showed occluded RCA with collaterals, 60% mLAD, 65% mLCx, 90% mid OM1.  RHC showed elevated filling pressures and low CI (2).  Patient was admitted again in 4/23 with CHF. He was admitted in 5/23 with hypertensive urgency and chest pain, CTA abdomen/pelvis did not show renal artery stenosis. He was admitted again in 6/23 with hypertensive urgency.  He quit smoking after 3/23 admission.  Rarely drinks ETOH and no drugs.  No strong family history of CAD or cardiomyopathy.    Sleep study was done, not suggestive of OSA.   He was admitted in 4/24 with chest pain => no evidence for ACS.  He had severe watery diarrhea via his ostomy and was dehydrated. Meds were held. Echo in 4/24 was improved, showing EF 45-50%, inferior and inferolateral hypokinesis, normal RV.   Patient continued to have chest pain episodes so LHC/RHC was done in 5/24 showing occluded mRCA with collaterals, 90% stenosis with severe diffuse disease in OM1. 50% mLAD, 60% proximal and mid LCx (unchanged from prior, no interventional target); normal filling pressures and preserved cardiac output.   Patient went to the ER 11/06/22 with atypical chest pain, negative workup.  He was in the ER again 11/10/22 with lightheadedness.  BP stable in ER but he said that SBP was as low as 80s at home. BP-active meds were adjusted.   Patient had progressive angina with repeat cath in 10/24.  This showed similar severe diffuse disease in the OM1, he had DES to OM1.  Other disease unchanged, CTO of RCA, 50% mid LAD.   Clarence [name] was admitted in 1/25  with atypical chest pain, thought to be noncardiac.  He was admitted again in 1/25 with nausea, vomiting, and diarrhea.  BP was low; Entresto, spironolactone, and Imdur were stopped and Coreg was decreased to 3.125 mg bid. He is off ranolazine due to side effects.   Patient returns for followup of CHF and CAD.  Weight is down 7 lbs.  Patient continues to report watery stool and also abdominal discomfort/bloating.  He does not have a GI appointment.  He has a chronic pattern of atypical chest pain.  This seems to be relatively mild, is not exertional, and can happen at any time with no trigger. No dyspnea walking on flat ground.  No orthopnea/PND.  No lightheadedness.   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 Crohns disease: s/p colectomy with ostomy.  2. CKD stage 3: History of hyperkalemia.  3. HTN: CT abdomen/pelvis with no evidence for renal artery stenosis.  4. Pulmonary nodules: Stable  on last CT.  5. CAD: LHC 3/23 with CTO mid RCA with collaterals, 60% mLAD, 65% mLCx, 90% mid OM1.  - LHC (5/24): occluded mRCA with collaterals, 90% stenosis with severe diffuse disease in OM1. 50% mLAD, 60% proximal and mid LCx (unchanged from prior, no interventional target) - LHC (10/24): Occluded mRCA with collaterals, 90% long/diffuse stenosis in OM1, 50% mLAD.  He was treated with DES to OM1.  6. Chronic systolic CHF: Ischemic cardiomyopathy (cannot fully rule out nonischemic component).  - Echo (3/23): EF 30-35%, moderate LVH,  normal RV. - RHC (3/23): mean RA 16, PA 67/30 mean 42, mean PCWP 25, CI 2, PVR 4 - Echo (5/23): EF 35-40%, global HK, moderate LVH, RV normal, mild Clarence - Echo (4/24): EF 45-50%, inferior and inferolateral hypokinesis, normal RV.  - RHC (5/24): mean RA 4, PA 36/9, mean PCWP 10, CI 2.36, PVR 1.7 WU 7. Sleep study negative for OSA.  8. Peripheral arterial dopplers (8/24): Normal.   SH: Single, quit smoking in 3/23, rare ETOH/no drugs, lives in [place] and works for a [employer].   FH: Uncles with "heart trouble"  ROS: All systems reviewed and negative except as per HPI.   Current Outpatient Medications  Medication Sig Dispense Refill   acetaminophen (TYLENOL) 325 MG tablet Take 650 mg by mouth at bedtime.     Alirocumab (PRALUENT) 150 MG/ML SOAJ Inject 1 mL (150 mg total) into the skin every 14 (fourteen) days. 2 mL 2   aluminum-magnesium hydroxide-simethicone (MAALOX) 200-200-20 MG/5ML SUSP Take 30 mLs by mouth 4 (four) times daily -  before meals and at bedtime. 355 mL 0   aspirin EC 81 MG tablet Take 1 tablet (81 mg total) by mouth daily. Swallow whole. 30 tablet 4   atorvastatin (LIPITOR) 80 MG tablet Take 1 tablet (80 mg total) by mouth every evening. 90 tablet 3   carvedilol (COREG) 3.125 MG tablet Take 1 tablet (3.125 mg total) by mouth 2 (two) times daily with a meal. 60 tablet 0   clopidogrel (PLAVIX) 75 MG tablet Take 1 tablet (75 mg total) by mouth daily. 30 tablet 11   empagliflozin (JARDIANCE) 10 MG TABS tablet TAKE 1 TABLET BY MOUTH EVERY DAY 90 tablet 1   ezetimibe (ZETIA) 10 MG tablet Take 1 tablet (10 mg total) by mouth daily. 90 tablet 3   famotidine (PEPCID) 20 MG tablet Take 1 tablet (20 mg total) by mouth 2 (two) times daily. 60 tablet 0   fenofibrate (TRICOR) 48 MG tablet Take 1 tablet (48 mg total) by mouth daily. 30 tablet 8   furosemide (LASIX) 20 MG tablet Take 1 tablet (20 mg total) by mouth daily as needed. For weight gain of 3 lb in 24 hours or 5 lbs in one  week 90 tablet 1   isosorbide mononitrate (IMDUR) 30 MG 24 hr tablet Take 1 tablet (30 mg total) by mouth daily. 90 tablet 3   metoCLOPramide (REGLAN) 10 MG tablet Take 1 tablet (10 mg total) by mouth every 8 (eight) hours as needed. 12 tablet 0   nitroGLYCERIN (NITROSTAT) 0.4 MG SL tablet Place 1 tablet (0.4 mg total) under the tongue every 5 (five) minutes as needed for chest pain. 25 tablet 3   ondansetron (ZOFRAN-ODT) 4 MG disintegrating tablet Take 1 tablet (4 mg total) by mouth every 8 (eight) hours as needed for nausea or vomiting. 20 tablet 0   pantoprazole (PROTONIX) 40 MG tablet Take 1 tablet (40 mg total) by mouth  daily. 30 tablet 11   sacubitril-valsartan (ENTRESTO) 24-26 MG Take 1 tablet by mouth 2 (two) times daily. 180 tablet 3   Semaglutide-Weight Management (WEGOVY) 0.5 MG/0.5ML SOAJ Inject 0.5 mg into the skin once a week. 2 mL 3   spironolactone (ALDACTONE) 25 MG tablet Take 1 tablet (25 mg total) by mouth daily. 90 tablet 3   No current facility-administered medications for this visit.   Wt Readings from Last 3 Encounters:  06/24/23 233 lb (105.7 kg)  06/17/23 239 lb 13.8 oz (108.8 kg)  06/12/23 240 lb (108.9 kg)   There were no vitals taken for this visit. General: NAD Neck: No JVD, no thyromegaly or thyroid nodule.  Lungs: Clear to auscultation bilaterally with normal respiratory effort. CV: Nondisplaced PMI.  Heart regular S1/S2, no S3/S4, no murmur.  No peripheral edema.  No carotid bruit.  Normal pedal pulses.  Abdomen: Soft, nontender, no hepatosplenomegaly, no distention. Ostomy present.  Skin: Intact without lesions or rashes.  Neurologic: Alert and oriented x 3.  Psych: Normal affect. Extremities: No clubbing or cyanosis.  HEENT: Normal.   Assessment/Plan: 1. Chronic systolic CHF: Suspect primarily ischemic cardiomyopathy but cannot rule out component of nonischemic cardiomyopathy. RHC in 3/23 with elevated filling pressures and low CI, 2. Echo in 5/23 with  EF 35-40%, normal RV.  Echo in 4/24 with EF up to 45-50%, inferior/inferolateral hypokinesis. NYHA class II, not volume overloaded on exam. GDMT has been limited by lightheadedness and CKD.  - Restart Entresto 24/26 bid.  BMET/BNP today, BMET in 10 days.  - Continue lower dose Coreg 3.125 mg bid.   - Continue Jardiance 10 mg daily.  - Restart spironolactone 12.5 mg daily unless labs today show elevated K.  - EF has been out of ICD range. I am going to arrange for repeat echo.  - He does not appear to need Lasix.  2. CAD: Cath in 3/23 with CTO mid RCA with collaterals, 60% mLAD, 65% mLCx, 90% mid OM1.  Cath in 5/24 was very similar with occluded mRCA with collaterals, 90% stenosis with severe diffuse disease in OM1. 50% mLAD, 60% proximal and mid LCx.  He continued to have frequent atypical chest pain, with progressive symptoms in 10/24, he was taken back for cath.  Images were very similar to 5/24 => this time he had DES to OM1.  He has chronic atypical chest pain, no change to pattern.  - Restart Imdur at 30 mg daily.   - Continue Coreg 3.125 mg bid.  - He stopped ranolazine due to side effects.  - Continue ASA 81 and Plavix 75 daily.  - Continue atorvastatin, Praluent and Zetia.  Good lipids in 1/25.   - Continue fenofibrate with elevated triglycerides.  3. CKD: Stage 3.  Follow closely, BMET today.   4. Obesity: Continue semaglutide.  5. Crohns disease: Patient has ostomy.  He does not have GI followup.  Has had persistent watery stool with abdominal discomfort/bloating.  - Check ESR, CRP.  - I will refer to GI for evaluation.   Followup with APP in 1 month.   I spent 31 minutes reviewing records, interviewing/examining patient, and managing orders.   Clarence [name] 07/21/2023     Clarence Freeze, [name] 07/21/23

## 2023-07-21 NOTE — Telephone Encounter (Signed)
 Pt confirmed appt for 07/22/23

## 2023-07-21 NOTE — Telephone Encounter (Signed)
 Patient reports continued nausea, malaise, and flu-like symptoms with his injectable medications. Patient instucted to hold Praluent and [facility] for now. He is following up with CHF clinic tomorrow and can discuss further.

## 2023-07-22 ENCOUNTER — Other Ambulatory Visit
Admission: RE | Admit: 2023-07-22 | Discharge: 2023-07-22 | Disposition: A | Discharge status: Home / Self Care | Payer: Self-pay | Source: Ambulatory Visit | Attending: Cardiology | Admitting: Cardiology

## 2023-07-22 ENCOUNTER — Encounter: Payer: Self-pay | Admitting: Family

## 2023-07-22 ENCOUNTER — Ambulatory Visit (HOSPITAL_BASED_OUTPATIENT_CLINIC_OR_DEPARTMENT_OTHER): Payer: Self-pay | Admitting: Family

## 2023-07-22 ENCOUNTER — Ambulatory Visit
Admission: RE | Admit: 2023-07-22 | Discharge: 2023-07-22 | Disposition: A | Discharge status: Home / Self Care | Payer: Self-pay | Source: Ambulatory Visit | Attending: Cardiology | Admitting: Cardiology

## 2023-07-22 VITALS — BP 126/92 | HR 87 | Wt 237.0 lb

## 2023-07-22 DIAGNOSIS — Z6834 Body mass index (BMI) 34.0-34.9, adult: Secondary | ICD-10-CM

## 2023-07-22 DIAGNOSIS — Z7984 Long term (current) use of oral hypoglycemic drugs: Secondary | ICD-10-CM | POA: Insufficient documentation

## 2023-07-22 DIAGNOSIS — K5090 Crohn's disease, unspecified, without complications: Secondary | ICD-10-CM | POA: Insufficient documentation

## 2023-07-22 DIAGNOSIS — I25118 Atherosclerotic heart disease of native coronary artery with other forms of angina pectoris: Secondary | ICD-10-CM

## 2023-07-22 DIAGNOSIS — Z9049 Acquired absence of other specified parts of digestive tract: Secondary | ICD-10-CM | POA: Insufficient documentation

## 2023-07-22 DIAGNOSIS — N183 Chronic kidney disease, stage 3 unspecified: Secondary | ICD-10-CM | POA: Insufficient documentation

## 2023-07-22 DIAGNOSIS — Z79899 Other long term (current) drug therapy: Secondary | ICD-10-CM | POA: Insufficient documentation

## 2023-07-22 DIAGNOSIS — Z7982 Long term (current) use of aspirin: Secondary | ICD-10-CM | POA: Insufficient documentation

## 2023-07-22 DIAGNOSIS — E669 Obesity, unspecified: Secondary | ICD-10-CM | POA: Insufficient documentation

## 2023-07-22 DIAGNOSIS — I130 Hypertensive heart and chronic kidney disease with heart failure and stage 1 through stage 4 chronic kidney disease, or unspecified chronic kidney disease: Secondary | ICD-10-CM | POA: Insufficient documentation

## 2023-07-22 DIAGNOSIS — I5022 Chronic systolic (congestive) heart failure: Secondary | ICD-10-CM

## 2023-07-22 DIAGNOSIS — I2510 Atherosclerotic heart disease of native coronary artery without angina pectoris: Secondary | ICD-10-CM | POA: Insufficient documentation

## 2023-07-22 DIAGNOSIS — E66811 Obesity, class 1: Secondary | ICD-10-CM

## 2023-07-22 DIAGNOSIS — I429 Cardiomyopathy, unspecified: Secondary | ICD-10-CM | POA: Insufficient documentation

## 2023-07-22 DIAGNOSIS — Z7902 Long term (current) use of antithrombotics/antiplatelets: Secondary | ICD-10-CM | POA: Insufficient documentation

## 2023-07-22 DIAGNOSIS — K50918 Crohn's disease, unspecified, with other complication: Secondary | ICD-10-CM

## 2023-07-22 DIAGNOSIS — Z87891 Personal history of nicotine dependence: Secondary | ICD-10-CM | POA: Insufficient documentation

## 2023-07-22 DIAGNOSIS — I5042 Chronic combined systolic (congestive) and diastolic (congestive) heart failure: Secondary | ICD-10-CM

## 2023-07-22 LAB — BASIC METABOLIC PANEL
Anion gap: 7 (ref 5–15)
BUN: 31 mg/dL — ABNORMAL HIGH (ref 6–20)
CO2: 23 mmol/L (ref 22–32)
Calcium: 9.7 mg/dL (ref 8.9–10.3)
Chloride: 108 mmol/L (ref 98–111)
Creatinine, Ser: 1.82 mg/dL — ABNORMAL HIGH (ref 0.61–1.24)
GFR, Estimated: 44 mL/min — ABNORMAL LOW (ref >60)
Glucose, Bld: 132 mg/dL — ABNORMAL HIGH (ref 70–99)
Potassium: 4.5 mmol/L (ref 3.5–5.1)
Sodium: 138 mmol/L (ref 135–145)

## 2023-07-22 NOTE — Progress Notes (Signed)
*  PRELIMINARY RESULTS* Echocardiogram 2D Echocardiogram has been performed.  Cristela Blue 07/22/2023, 9:46 AM

## 2023-07-22 NOTE — Patient Instructions (Signed)
 Lab Work:  Go over to the [facility]. Go pass the gift shop and have your blood work completed.  We will only call you if the results are abnormal or if the provider would like to make medication changes.   Follow-Up in: 2 WEEKS WITH NASON WISE, RPH-CPP  At the [facility], you and your health needs are our priority. We have a designated team specialized in the treatment of Heart Failure. This Care Team includes your primary Heart Failure Specialized Cardiologist (physician), Advanced Practice Providers (APPs- Physician Assistants and Nurse Practitioners), and Pharmacist who all work together to provide you with the care you need, when you need it.   You may see any of the following providers on your designated Care Team at your next follow up:  Dr. Arvilla Meres Dr. Marca Ancona Dr. Dorthula Nettles Dr. Theresia Bough Clarisa Kindred, [name] Enos Fling, RPH-CPP  Please be sure to bring in all your medications bottles to every appointment.   Need to Contact us:  If you have any questions or concerns before your next appointment please send [place] a message through [place] or call our office at [phone].    TO LEAVE A MESSAGE FOR THE NURSE SELECT OPTION 2, PLEASE LEAVE A MESSAGE INCLUDING: YOUR NAME DATE OF BIRTH CALL BACK NUMBER REASON FOR CALL**this is important as we prioritize the call backs  YOU WILL RECEIVE A CALL BACK THE SAME DAY AS LONG AS YOU CALL BEFORE 4:00 PM

## 2023-07-24 LAB — ECHOCARDIOGRAM COMPLETE
AR max vel: 4.11 cm2
AV Area VTI: 4.4 cm2
AV Area mean vel: 4.21 cm2
AV Mean grad: 1 mmHg
AV Peak grad: 2.9 mmHg
Ao pk vel: 0.85 m/s
Area-P 1/2: 7.66 cm2
S' Lateral: 3.8 cm

## 2023-08-02 ENCOUNTER — Other Ambulatory Visit (HOSPITAL_COMMUNITY): Payer: Self-pay

## 2023-08-02 ENCOUNTER — Telehealth: Payer: Self-pay | Admitting: Pharmacist

## 2023-08-02 NOTE — Telephone Encounter (Signed)
 Patient notified the clinic that he lost his insurance. A social worker is helping him apply for Medicare part A/B, but patient is unsure if he will qualify. Will submit patient assistance paperwork for Entresto and Jardiance in the meantime. Patient will come to clinic to sign forms.

## 2023-08-03 ENCOUNTER — Telehealth: Payer: Self-pay | Admitting: Pharmacist

## 2023-08-03 MED ORDER — FUROSEMIDE 20 MG PO TABS: 20.0000 mg | ORAL_TABLET | Freq: Every day | ORAL | 1 refills | Status: DC | PRN

## 2023-08-03 NOTE — Telephone Encounter (Signed)
 Patient presented to clinic today to sign Jardiance and Entresto patient assistance forms. He has not heard back from Medicare yet. A med rec was performed. The patient currently has all medications but furosemide. Refill sent in.

## 2023-08-10 ENCOUNTER — Telehealth: Payer: Self-pay

## 2023-08-10 NOTE — Telephone Encounter (Signed)
 Advanced Heart Failure Patient Advocate Encounter  Application for Jardiance faxed to [facility] on 08/10/2023. Application form attached to patient chart.  Burnell Blanks, CPhT Rx Patient Advocate Phone: [phone]

## 2023-08-10 NOTE — Telephone Encounter (Signed)
 Advanced Heart Failure Patient Advocate Encounter  Application for Entresto faxed to [employer] on 08/10/2023. Application form attached to patient chart.  Burnell Blanks, CPhT Rx Patient Advocate Phone: [phone]

## 2023-08-11 ENCOUNTER — Encounter

## 2023-08-16 NOTE — Telephone Encounter (Signed)
 Patient was approved to receive Jardiance from [facility] Effective 08/14/2023 to 08/10/2024

## 2023-08-16 NOTE — Telephone Encounter (Signed)
 Attempted to contact [employer] for status update. The office is currently 'unable to accept phone calls at this time' Will continue to follow up.

## 2023-08-18 ENCOUNTER — Other Ambulatory Visit: Payer: Self-pay | Admitting: Cardiology

## 2023-08-23 NOTE — Telephone Encounter (Signed)
 Contacted [employer] for status update. Representative requested clarification of the MD/Rx form. That has been updated and faxed on 08/23/2023. [employer] is also requesting proof of income for this patient. Spoke to pt by phone, he will provide POI by email to me when able.

## 2023-08-31 ENCOUNTER — Encounter: Payer: Self-pay | Admitting: Family

## 2023-09-01 NOTE — Telephone Encounter (Signed)
 [employer] is now only accepting 1040 or attestation letter as proof of income.  Patient will need to provide first 2 pages of tax return before a determination can be made. Left voicemail for patient

## 2023-09-03 NOTE — Telephone Encounter (Signed)
 Spoke to patient by phone, he will contact [employer] to request attestation letter. Will continue to follow for updates.

## 2023-09-07 ENCOUNTER — Ambulatory Visit: Payer: Medicaid Other | Admitting: Family

## 2023-09-07 ENCOUNTER — Other Ambulatory Visit: Payer: Self-pay

## 2023-09-07 MED ORDER — SPIRONOLACTONE 25 MG PO TABS
25.0000 mg | ORAL_TABLET | Freq: Every day | ORAL | 3 refills | Status: DC
  Filled 2023-09-07: qty 90, 90d supply, fill #0

## 2023-09-07 MED ORDER — FUROSEMIDE 20 MG PO TABS
20.0000 mg | ORAL_TABLET | Freq: Every day | ORAL | 1 refills | Status: DC | PRN
  Filled 2023-09-07: qty 90, 90d supply, fill #0

## 2023-09-07 MED ORDER — FENOFIBRATE 48 MG PO TABS
48.0000 mg | ORAL_TABLET | Freq: Every day | ORAL | 5 refills | Status: DC
  Filled 2023-09-07: qty 30, 30d supply, fill #0

## 2023-09-07 NOTE — Progress Notes (Signed)
 Pt sent message that he has no insurance and will not have any until September 2025. Medications he needs have been sent to the [facility] per pt request. Pt notified.

## 2023-09-20 NOTE — Telephone Encounter (Signed)
 Patient was approved to receive Entresto  from [employer] Effective 09/07/2023 to 09/06/2024.  First delivery scheduled and confirmed for 09/23/2023. Informed patient by phone.

## 2023-09-20 NOTE — Telephone Encounter (Signed)
 Attempted to contact patient for update, no answer, left voicemail.

## 2023-10-18 ENCOUNTER — Emergency Department: Payer: Self-pay

## 2023-10-18 ENCOUNTER — Inpatient Hospital Stay
Admission: EM | Admit: 2023-10-18 | Discharge: 2023-10-20 | DRG: 683 | Disposition: A | Discharge status: Home / Self Care | Payer: Self-pay | Attending: Internal Medicine | Admitting: Internal Medicine

## 2023-10-18 ENCOUNTER — Other Ambulatory Visit: Payer: Self-pay

## 2023-10-18 DIAGNOSIS — Z7985 Long-term (current) use of injectable non-insulin antidiabetic drugs: Secondary | ICD-10-CM

## 2023-10-18 DIAGNOSIS — I959 Hypotension, unspecified: Secondary | ICD-10-CM | POA: Diagnosis present

## 2023-10-18 DIAGNOSIS — R112 Nausea with vomiting, unspecified: Secondary | ICD-10-CM

## 2023-10-18 DIAGNOSIS — E871 Hypo-osmolality and hyponatremia: Secondary | ICD-10-CM | POA: Diagnosis present

## 2023-10-18 DIAGNOSIS — K5010 Crohn's disease of large intestine without complications: Secondary | ICD-10-CM | POA: Diagnosis present

## 2023-10-18 DIAGNOSIS — E860 Dehydration: Secondary | ICD-10-CM | POA: Diagnosis present

## 2023-10-18 DIAGNOSIS — N179 Acute kidney failure, unspecified: Secondary | ICD-10-CM | POA: Diagnosis not present

## 2023-10-18 DIAGNOSIS — I10 Essential (primary) hypertension: Secondary | ICD-10-CM | POA: Diagnosis present

## 2023-10-18 DIAGNOSIS — Z955 Presence of coronary angioplasty implant and graft: Secondary | ICD-10-CM

## 2023-10-18 DIAGNOSIS — R202 Paresthesia of skin: Secondary | ICD-10-CM | POA: Diagnosis present

## 2023-10-18 DIAGNOSIS — I428 Other cardiomyopathies: Secondary | ICD-10-CM | POA: Diagnosis present

## 2023-10-18 DIAGNOSIS — A084 Viral intestinal infection, unspecified: Secondary | ICD-10-CM | POA: Diagnosis present

## 2023-10-18 DIAGNOSIS — Z7984 Long term (current) use of oral hypoglycemic drugs: Secondary | ICD-10-CM

## 2023-10-18 DIAGNOSIS — Z9861 Coronary angioplasty status: Secondary | ICD-10-CM

## 2023-10-18 DIAGNOSIS — Z933 Colostomy status: Secondary | ICD-10-CM

## 2023-10-18 DIAGNOSIS — K529 Noninfective gastroenteritis and colitis, unspecified: Secondary | ICD-10-CM | POA: Diagnosis present

## 2023-10-18 DIAGNOSIS — Z7982 Long term (current) use of aspirin: Secondary | ICD-10-CM

## 2023-10-18 DIAGNOSIS — Z87891 Personal history of nicotine dependence: Secondary | ICD-10-CM

## 2023-10-18 DIAGNOSIS — E861 Hypovolemia: Secondary | ICD-10-CM | POA: Diagnosis present

## 2023-10-18 DIAGNOSIS — E875 Hyperkalemia: Secondary | ICD-10-CM | POA: Diagnosis present

## 2023-10-18 DIAGNOSIS — Z7902 Long term (current) use of antithrombotics/antiplatelets: Secondary | ICD-10-CM

## 2023-10-18 DIAGNOSIS — I2510 Atherosclerotic heart disease of native coronary artery without angina pectoris: Secondary | ICD-10-CM | POA: Diagnosis present

## 2023-10-18 DIAGNOSIS — I2721 Secondary pulmonary arterial hypertension: Secondary | ICD-10-CM | POA: Diagnosis present

## 2023-10-18 DIAGNOSIS — I130 Hypertensive heart and chronic kidney disease with heart failure and stage 1 through stage 4 chronic kidney disease, or unspecified chronic kidney disease: Secondary | ICD-10-CM | POA: Diagnosis present

## 2023-10-18 DIAGNOSIS — N19 Unspecified kidney failure: Secondary | ICD-10-CM

## 2023-10-18 DIAGNOSIS — N1832 Chronic kidney disease, stage 3b: Secondary | ICD-10-CM | POA: Diagnosis present

## 2023-10-18 DIAGNOSIS — Z8249 Family history of ischemic heart disease and other diseases of the circulatory system: Secondary | ICD-10-CM

## 2023-10-18 DIAGNOSIS — Z79899 Other long term (current) drug therapy: Secondary | ICD-10-CM

## 2023-10-18 DIAGNOSIS — I5022 Chronic systolic (congestive) heart failure: Secondary | ICD-10-CM | POA: Diagnosis present

## 2023-10-18 LAB — HEMOGLOBIN A1C
Hgb A1c MFr Bld: 6.6 % — ABNORMAL HIGH (ref 4.8–5.6)
Mean Plasma Glucose: 142.72 mg/dL

## 2023-10-18 LAB — TROPONIN I (HIGH SENSITIVITY)
Troponin I (High Sensitivity): 36 ng/L — ABNORMAL HIGH (ref <18)
Troponin I (High Sensitivity): 34 ng/L — ABNORMAL HIGH (ref <18)

## 2023-10-18 LAB — COMPREHENSIVE METABOLIC PANEL WITH GFR
ALT: 20 U/L (ref 0–44)
AST: 13 U/L — ABNORMAL LOW (ref 15–41)
Albumin: 4.3 g/dL (ref 3.5–5.0)
Alkaline Phosphatase: 71 U/L (ref 38–126)
Anion gap: 7 (ref 5–15)
BUN: 85 mg/dL — ABNORMAL HIGH (ref 6–20)
CO2: 18 mmol/L — ABNORMAL LOW (ref 22–32)
Calcium: 9.2 mg/dL (ref 8.9–10.3)
Chloride: 104 mmol/L (ref 98–111)
Creatinine, Ser: 3.63 mg/dL — ABNORMAL HIGH (ref 0.61–1.24)
GFR, Estimated: 19 mL/min — ABNORMAL LOW (ref >60)
Glucose, Bld: 186 mg/dL — ABNORMAL HIGH (ref 70–99)
Potassium: 5.8 mmol/L — ABNORMAL HIGH (ref 3.5–5.1)
Sodium: 129 mmol/L — ABNORMAL LOW (ref 135–145)
Total Bilirubin: 0.8 mg/dL (ref 0.0–1.2)
Total Protein: 7.6 g/dL (ref 6.5–8.1)

## 2023-10-18 LAB — CBC
HCT: 49.4 % (ref 39.0–52.0)
Hemoglobin: 16.6 g/dL (ref 13.0–17.0)
MCH: 27.6 pg (ref 26.0–34.0)
MCHC: 33.6 g/dL (ref 30.0–36.0)
MCV: 82.1 fL (ref 80.0–100.0)
Platelets: 326 10*3/uL (ref 150–400)
RBC: 6.02 MIL/uL — ABNORMAL HIGH (ref 4.22–5.81)
RDW: 14.1 % (ref 11.5–15.5)
WBC: 9.9 10*3/uL (ref 4.0–10.5)
nRBC: 0 % (ref 0.0–0.2)

## 2023-10-18 LAB — LACTIC ACID, PLASMA: Lactic Acid, Venous: 1.4 mmol/L (ref 0.5–1.9)

## 2023-10-18 MED ORDER — ACETAMINOPHEN 500 MG PO TABS
1000.0000 mg | ORAL_TABLET | Freq: Once | ORAL | Status: AC
  Administered 2023-10-18: 1000 mg via ORAL
  Filled 2023-10-18: qty 2

## 2023-10-18 MED ORDER — PANTOPRAZOLE SODIUM 40 MG PO TBEC
40.0000 mg | DELAYED_RELEASE_TABLET | Freq: Every day | ORAL | Status: DC
  Administered 2023-10-19 – 2023-10-20 (×2): 40 mg via ORAL
  Filled 2023-10-18 (×2): qty 1

## 2023-10-18 MED ORDER — FAMOTIDINE 20 MG PO TABS
20.0000 mg | ORAL_TABLET | Freq: Two times a day (BID) | ORAL | Status: DC
  Administered 2023-10-18 – 2023-10-20 (×4): 20 mg via ORAL
  Filled 2023-10-18 (×4): qty 1

## 2023-10-18 MED ORDER — LACTATED RINGERS IV SOLN: INTRAVENOUS | Status: AC

## 2023-10-18 MED ORDER — ACETAMINOPHEN 650 MG RE SUPP
650.0000 mg | Freq: Four times a day (QID) | RECTAL | Status: DC | PRN
Start: 2023-10-18 — End: 2023-10-20

## 2023-10-18 MED ORDER — SODIUM ZIRCONIUM CYCLOSILICATE 10 G PO PACK
10.0000 g | PACK | Freq: Once | ORAL | Status: AC
  Administered 2023-10-18: 10 g via ORAL
  Filled 2023-10-18: qty 1

## 2023-10-18 MED ORDER — SODIUM CHLORIDE 0.9 % IV BOLUS
1000.0000 mL | Freq: Once | INTRAVENOUS | Status: AC
  Administered 2023-10-18: 1000 mL via INTRAVENOUS

## 2023-10-18 MED ORDER — ATORVASTATIN CALCIUM 80 MG PO TABS
80.0000 mg | ORAL_TABLET | Freq: Every evening | ORAL | Status: DC
  Administered 2023-10-18 – 2023-10-19 (×2): 80 mg via ORAL
  Filled 2023-10-18 (×2): qty 1

## 2023-10-18 MED ORDER — ONDANSETRON HCL 4 MG/2ML IJ SOLN
4.0000 mg | Freq: Once | INTRAMUSCULAR | Status: AC
  Administered 2023-10-18: 4 mg via INTRAVENOUS
  Filled 2023-10-18: qty 2

## 2023-10-18 MED ORDER — SODIUM CHLORIDE 0.9% FLUSH
3.0000 mL | Freq: Two times a day (BID) | INTRAVENOUS | Status: DC
  Administered 2023-10-18 – 2023-10-20 (×4): 3 mL via INTRAVENOUS

## 2023-10-18 MED ORDER — ACETAMINOPHEN 325 MG PO TABS
650.0000 mg | ORAL_TABLET | Freq: Four times a day (QID) | ORAL | Status: DC | PRN
  Administered 2023-10-19 – 2023-10-20 (×2): 650 mg via ORAL
  Filled 2023-10-18 (×2): qty 2

## 2023-10-18 MED ORDER — ONDANSETRON HCL 4 MG PO TABS: 4.0000 mg | ORAL_TABLET | Freq: Four times a day (QID) | ORAL | Status: DC | PRN

## 2023-10-18 MED ORDER — CLOPIDOGREL BISULFATE 75 MG PO TABS
75.0000 mg | ORAL_TABLET | Freq: Every day | ORAL | Status: DC
  Administered 2023-10-19 – 2023-10-20 (×2): 75 mg via ORAL
  Filled 2023-10-18 (×2): qty 1

## 2023-10-18 MED ORDER — ENOXAPARIN SODIUM 30 MG/0.3ML IJ SOSY
30.0000 mg | PREFILLED_SYRINGE | INTRAMUSCULAR | Status: DC
  Administered 2023-10-18: 30 mg via SUBCUTANEOUS
  Filled 2023-10-18: qty 0.3

## 2023-10-18 MED ORDER — ONDANSETRON HCL 4 MG/2ML IJ SOLN: 4.0000 mg | Freq: Four times a day (QID) | INTRAMUSCULAR | Status: DC | PRN

## 2023-10-18 MED ORDER — ASPIRIN 81 MG PO TBEC
81.0000 mg | DELAYED_RELEASE_TABLET | Freq: Every day | ORAL | Status: DC
  Administered 2023-10-19 – 2023-10-20 (×2): 81 mg via ORAL
  Filled 2023-10-18 (×2): qty 1

## 2023-10-18 MED ORDER — EZETIMIBE 10 MG PO TABS
10.0000 mg | ORAL_TABLET | Freq: Every day | ORAL | Status: DC
  Administered 2023-10-19 – 2023-10-20 (×2): 10 mg via ORAL
  Filled 2023-10-18 (×2): qty 1

## 2023-10-18 NOTE — Assessment & Plan Note (Signed)
 Patient presenting with notable hypotension in the setting of hypovolemia.  Blood pressure has been fluid responsive.  - Continue IV fluids as ordered

## 2023-10-18 NOTE — Assessment & Plan Note (Signed)
 Per chart review, patient has a history of HFrEF with last EF of 45-50%, March 2025.  He appears hypovolemic at this time.  - Hold home diuretics and GDMT - Daily weights

## 2023-10-18 NOTE — H&P (Addendum)
 History and Physical    Patient: Clarence [name] HQI:[id number] DOB: July 30, 1970 DOA: 10/18/2023 DOS: the patient was seen and examined on 10/18/2023 PCP: NgetichElijio Guadeloupe, NP  Patient coming from: Home  Chief Complaint:  Chief Complaint  Patient presents with   Dizziness   HPI: Clarence [name] is a 53 y.o. male with medical history significant of HFrEF, CAD s/p PCI, CKD stage IIIb, Crohn's disease s/p colectomy with colostomy, pulmonary hypertension, who presents to the ED due to nausea, vomiting and loose stool.  Clarence [name] states that for the last 2-3 days, Clarence [name] has been experiencing persistent nausea, vomiting, and markedly increased watery stool output from Clarence [name] colostomy.  Clarence [name] notes that Clarence [name] stool is still brown with no evidence of hematochezia or melena.  Clarence [name] denies any chest pain, shortness of breath or new lower extremity swelling.    Clarence [name] endorses chronic paresthesia of bilateral hands and feet, in addition to intermittent perioral paresthesia that has been ongoing for few weeks now.   ED course: On arrival to the ED, patient was hypotensive as low as 72/55 with heart rate of 88.  Clarence [name] was saturating at 98% on room air.  Clarence [name] was afebrile at 98.  Initial workup notable for unremarkable CBC, sodium 129, potassium 5.8, bicarb 18, BUN 85, creatinine 3.63, with GFR of 19.  Troponin 36.  Lactic acid 1.4.  CT head and CT abdomen with no acute findings.  Chest x-ray with no active disease.  Patient started on IV fluids and [facility] contacted for admission.   Review of Systems: As mentioned in the history of present illness. All other systems reviewed and are negative.  Past Medical History:  Diagnosis Date   Ascending Aortic Dilation    a. 09/2021 Echo: Asc Ao 40mm.   CAD (coronary artery disease)    a. 07/2021 Cath: LM nl, LAD 42m, D2 40, LCX 65p/m, OM1 90, RCA 135m w/ L-.R collats to RPDA from septal 1/2-->Med rx.   Chronic HFrEF (heart failure with reduced ejection fraction) (HCC)    a. 07/2021  Echo: EF 30-35%, glob HK, mod LVH, GrII DD, nl RV fxn, mildly dil LA, mild-mod MR; b. 09/2021 Echo: EF 35-40%, glob HK, mod LVH, GrI DD, nl RV fxn, mildly dil RA, mild MR, Asc Ao 40mm.   CKD (chronic kidney disease) stage 2, GFR 60-89 ml/min    Crohn's disease (HCC)    Hypertension    Mixed Ischemic & Nonischemic cardiomyopathy    a. 07/2021 Echo: EF 30-35%; b. 07/2021 Cath: Occluded RCA w/ mod LAD/LCX dzs, and severe OM1 dzs-->Med Rx; c. 09/2021 Echo: EF 35-40%.   PAH (pulmonary artery hypertension) (HCC)    a. 07/2021 RHC: PA 67/30 (42).   Proteinuria    Past Surgical History:  Procedure Laterality Date   COLECTOMY     CORONARY STENT INTERVENTION N/A 03/02/2023   Procedure: CORONARY STENT INTERVENTION;  Surgeon: Sammy Crisp, MD;  Location: [facility] INVASIVE CV LAB;  Service: Cardiovascular;  Laterality: N/A;   LEFT HEART CATH AND CORONARY ANGIOGRAPHY Left 03/02/2023   Procedure: LEFT HEART CATH AND CORONARY ANGIOGRAPHY;  Surgeon: Sammy Crisp, MD;  Location: [facility] INVASIVE CV LAB;  Service: Cardiovascular;  Laterality: Left;   RIGHT/LEFT HEART CATH AND CORONARY ANGIOGRAPHY N/A 08/07/2021   Procedure: RIGHT/LEFT HEART CATH AND CORONARY ANGIOGRAPHY;  Surgeon: Sammy Crisp, MD;  Location: [facility] INVASIVE CV LAB;  Service: Cardiovascular;  Laterality: N/A;   RIGHT/LEFT HEART CATH AND CORONARY ANGIOGRAPHY N/A 09/29/2022   Procedure: RIGHT/LEFT  HEART CATH AND CORONARY ANGIOGRAPHY;  Surgeon: Darlis Eisenmenger, MD;  Location: [facility] INVASIVE CV LAB;  Service: Cardiovascular;  Laterality: N/A;   Social History:  reports that Clarence [name] quit smoking about 2 years ago. Clarence [name] smoking use included cigars. Clarence [name] does not have any smokeless tobacco history on file. Clarence [name] reports that Clarence [name] does not currently use alcohol after a past usage of about 1.0 standard drink of alcohol per week. Clarence [name] reports that Clarence [name] does not use drugs.  Allergies  Allergen Reactions   Ceftriaxone  Rash    Macular rash with itchness   Ibuprofen Other (See  Comments)    History of GIB with ibuprofen    Sulfacetamide Other (See Comments)   Neomycin-Bacitracin Zn-Polymyx     Other reaction(s): Other (See Comments)   Spironolactone      Hyperkalemia    Family History  Problem Relation Age of Onset   Hypertension Mother    Cancer Maternal Aunt    Heart disease Maternal Uncle    Pancreatic cancer Maternal Uncle     Prior to Admission medications   Medication Sig Start Date End Date Taking? Authorizing Provider  acetaminophen  (TYLENOL ) 325 MG tablet Take 650 mg by mouth at bedtime.    [provider]  Alirocumab  (PRALUENT ) 150 MG/ML SOAJ Inject 1 mL (150 mg total) into the skin every 14 (fourteen) days. Patient not taking: Reported on 07/22/2023 05/21/23   Darlis Eisenmenger, MD  aluminum -magnesium  hydroxide-simethicone  (MAALOX) 200-200-20 MG/5ML SUSP Take 30 mLs by mouth 4 (four) times daily -  before meals and at bedtime. 06/17/23   Jacquie Maudlin, MD  aspirin  EC 81 MG tablet Take 1 tablet (81 mg total) by mouth daily. Swallow whole. 05/20/22   Milford, Arlice Bene, [name]  atorvastatin  (LIPITOR ) 80 MG tablet Take 1 tablet (80 mg total) by mouth every evening. 10/29/22   Milford, Arlice Bene, [name]  carvedilol  (COREG ) 6.25 MG tablet Take 6.25 mg by mouth 2 (two) times daily with a meal.    [provider]  clopidogrel  (PLAVIX ) 75 MG tablet Take 1 tablet (75 mg total) by mouth daily. 04/30/23   Darlis Eisenmenger, MD  empagliflozin  (JARDIANCE ) 10 MG TABS tablet TAKE 1 TABLET BY MOUTH EVERY DAY 06/14/23   Ngetich, Dinah C, NP  ezetimibe  (ZETIA ) 10 MG tablet Take 1 tablet (10 mg total) by mouth daily. 11/11/22   Darlis Eisenmenger, MD  famotidine  (PEPCID ) 20 MG tablet Take 1 tablet (20 mg total) by mouth 2 (two) times daily. 06/17/23   Jacquie Maudlin, MD  fenofibrate  (TRICOR ) 48 MG tablet Take 1 tablet (48 mg total) by mouth daily. 09/07/23   Charlette Console, [name]  furosemide  (LASIX ) 20 MG tablet Take 1 tablet (20 mg total) by mouth daily as needed.  For weight gain of 3 lb in 24 hours or 5 lbs in one week 09/07/23   Shawnee Dellen A, [name]  isosorbide  mononitrate (IMDUR ) 60 MG 24 hr tablet Take 90 mg by mouth daily.    [provider]  metoCLOPramide  (REGLAN ) 10 MG tablet Take 1 tablet (10 mg total) by mouth every 8 (eight) hours as needed. 06/06/23 06/05/24  Lind Repine, MD  nitroGLYCERIN  (NITROSTAT ) 0.4 MG SL tablet Place 1 tablet (0.4 mg total) under the tongue every 5 (five) minutes as needed for chest pain. 07/29/22   Ngetich, Dinah C, NP  ondansetron  (ZOFRAN -ODT) 4 MG disintegrating tablet Take 1 tablet (4 mg total) by mouth every 8 (eight) hours as needed for nausea or  vomiting. 06/17/23   Jacquie Maudlin, MD  pantoprazole  (PROTONIX ) 40 MG tablet Take 1 tablet (40 mg total) by mouth daily. 10/14/22   Darlis Eisenmenger, MD  sacubitril -valsartan  (ENTRESTO ) 24-26 MG TAKE 1 TABLET BY MOUTH TWICE A DAY 08/19/23   Darlis Eisenmenger, MD  Semaglutide -Weight Management (WEGOVY ) 0.5 MG/0.5ML SOAJ Inject 0.5 mg into the skin once a week. Patient not taking: Reported on 07/22/2023 07/15/23   Darlis Eisenmenger, MD  spironolactone  (ALDACTONE ) 25 MG tablet Take 1 tablet (25 mg total) by mouth daily. 09/07/23   Charlette Console, [name]    Physical Exam: Vitals:   10/18/23 1700 10/18/23 1800 10/18/23 1830 10/18/23 1851  BP: (!) 90/57 102/82 (!) 88/58 104/72  Pulse: 68 72 63 66  Resp: (!) 21 18 16 19   Temp:    (!) 97.4 F (36.3 C)  TempSrc:    Oral  SpO2: 98% 98% 99% 100%  Weight:      Height:       Physical Exam Vitals and nursing note reviewed.  Constitutional:      Appearance: Clarence [name] is obese. Clarence [name] is ill-appearing.  HENT:     Head: Normocephalic and atraumatic.     Mouth/Throat:     Mouth: Mucous membranes are dry.  Cardiovascular:     Rate and Rhythm: Normal rate and regular rhythm.     Heart sounds: Murmur heard.  Pulmonary:     Effort: Pulmonary effort is normal. No respiratory distress.     Breath sounds: Normal breath sounds. No  wheezing, rhonchi or rales.  Abdominal:     General: Bowel sounds are normal. There is no distension.     Palpations: Abdomen is soft.     Tenderness: There is no abdominal tenderness. There is no guarding.     Comments: Right lower quadrant colostomy site with stoma, functioning normally  Musculoskeletal:     Right lower leg: No edema.     Left lower leg: No edema.  Skin:    General: Skin is warm and dry.  Neurological:     Mental Status: Clarence [name] is alert and oriented to person, place, and time. Mental status is at baseline.  Psychiatric:        Mood and Affect: Mood normal.        Behavior: Behavior normal.    Data Reviewed: CBC with WBC of 9.9, hemoglobin of 16.6, platelets of 326 CMP with sodium of 129, potassium 5.8, bicarb 18, anion gap 7, BUN 85, creatinine 3.63, AST 13, ALT 20, GFR of 19 Troponin 36 Lactic acid 1.4  EKG personally reviewed.  Sinus rhythm with rate of 81.  Borderline first-degree AV block.  No acute changes compared to prior.  CT ABDOMEN PELVIS WO CONTRAST Result Date: 10/18/2023 CLINICAL DATA:  Nausea with vomiting and worsening watery output from the osteotomy. Evaluate for acute intra-infection. EXAM: CT ABDOMEN AND PELVIS WITHOUT CONTRAST TECHNIQUE: Multidetector CT imaging of the abdomen and pelvis was performed following the standard protocol without IV contrast. RADIATION DOSE REDUCTION: This exam was performed according to the departmental dose-optimization program which includes automated exposure control, adjustment of the mA and/or kV according to patient size and/or use of iterative reconstruction technique. COMPARISON:  Abdominopelvic CT 06/12/2023 and 06/06/2023. FINDINGS: Lower chest: The visualized lung bases are now clear with resolution of the previously demonstrated patchy right lower lobe airspace disease. No significant pleural or pericardial effusion. Hepatobiliary: The liver has a stable, non cirrhotic morphology. No focal hepatic lesions are  identified on noncontrast imaging. Incomplete gallbladder distention. No evidence of gallstones, gallbladder wall thickening or biliary dilatation. Pancreas: Unremarkable. No pancreatic ductal dilatation or surrounding inflammatory changes. Spleen: Normal in size without focal abnormality. Adrenals/Urinary Tract: Both adrenal glands appear normal. No evidence of urinary tract calculus, hydronephrosis or perinephric soft tissue stranding. Previously demonstrated small cystic lesions in the lower pole of the right kidney are unchanged; no specific follow-up imaging recommended. The bladder appears unremarkable for its degree of distention. Stomach/Bowel: No enteric contrast administered. 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 No evidence of incarceration or obstruction. Stable submucosal fat deposition within the [employer]. Mildly prominent ingested material within the stomach. Vascular/Lymphatic: There are no enlarged abdominal or pelvic lymph nodes. Minimal aortoiliac atherosclerosis. Reproductive: The prostate gland and seminal vesicles appear unremarkable. 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 No evidence of ascites or pneumoperitoneum. Musculoskeletal: No acute or significant osseous findings. Lower lumbar spondylosis. IMPRESSION: 1. No acute findings or explanation for the patient's symptoms. 2. Stable postoperative changes status post colectomy with right lower quadrant end ileostomy. 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 3. Resolution of previously demonstrated right lower lobe airspace disease. 4.  Aortic Atherosclerosis (ICD10-I70.0). Electronically Signed   By: Elmon Hagedorn M.D.   On: 10/18/2023 17:02   CT HEAD WO  CONTRAST ( ) Result Date: 10/18/2023 CLINICAL DATA:  Dizziness. EXAM: CT HEAD WITHOUT CONTRAST TECHNIQUE: Contiguous axial images were obtained from the base of the skull through the vertex without intravenous contrast. RADIATION DOSE REDUCTION: This exam was performed according to the departmental dose-optimization program which includes automated exposure control, adjustment of the mA and/or kV according to patient size and/or use of iterative reconstruction technique. COMPARISON:  Head CT 09/14/2021 FINDINGS: Brain: No intracranial hemorrhage, mass effect, or midline shift. No hydrocephalus. The basilar cisterns are patent. No evidence of territorial infarct or acute ischemia. No extra-axial or intracranial fluid collection. Vascular: Atherosclerosis of skullbase vasculature without hyperdense vessel or abnormal calcification. Skull: No fracture or focal lesion. Sinuses/Orbits: Paranasal sinuses and mastoid air cells are clear. The visualized orbits are unremarkable. Other: None. IMPRESSION: No acute intracranial abnormality. Electronically Signed   By: Chadwick Colonel M.D.   On: 10/18/2023 16:51   DG Chest Port 1 View Result Date: 10/18/2023 CLINICAL DATA:  Dizziness EXAM: PORTABLE CHEST 1 VIEW COMPARISON:  January 26 25 FINDINGS: The heart size and mediastinal contours are within normal limits. Both lungs are clear. The visualized skeletal structures are unremarkable. IMPRESSION: No active disease. Electronically Signed   By: Fredrich Jefferson M.D.   On: 10/18/2023 15:55   Results are pending, will review when available.  Assessment and Plan:  * Acute gastroenteritis Per chart review, patient has a history of recurrent nausea/vomiting/increased watery stool output, with most recent episode beginning 2-3 days ago.  ESR and CRP have been negative in the past.  Difficult to say if this is infectious in etiology given previous similar episodes.  - GI panel ordered - Check stool lactoferrin and  calprotectin - Hold off on Imodium  pending GI panel - Zofran  as needed  Hypovolemic Hypotension Patient presenting with notable hypotension in the setting of hypovolemia.  Blood pressure has been fluid responsive.  - Continue IV fluids as ordered  AKI (acute kidney injury) (HCC) Marked AKI, with history of CKD  stage IIIb in the setting of GI losses.  - IV fluids as ordered - Hold home nephrotoxic agents - Repeat BMP in the a.m.  Hyperkalemia Chronic recurrent problem, exacerbated by AKI.  - One-time dose of Lokelma   CAD S/P percutaneous coronary angioplasty Patient denies any chest pain at this time.  Troponin minimally elevated, likely due to hypotension and hypovolemia.  - Continue home aspirin  and antilipid agents  Chronic HFrEF (heart failure with reduced ejection fraction) (HCC) Per chart review, patient has a history of HFrEF with last EF of 45-50%, March 2025.  Clarence [name] appears hypovolemic at this time.  - Hold home diuretics and GDMT - Daily weights  Essential hypertension - Hold home antihypertensives given hypotension and AKI  Advance Care Planning:   Code Status: Full Code   Consults: None  Family Communication: No family at bedside  Severity of Illness: The appropriate patient status for this patient is OBSERVATION. Observation status is judged to be reasonable and necessary in order to provide the required intensity of service to ensure the patient's safety. The patient's presenting symptoms, physical exam findings, and initial radiographic and laboratory data in the context of their medical condition is felt to place them at decreased risk for further clinical deterioration. Furthermore, it is anticipated that the patient will be medically stable for discharge from the hospital within 2 midnights of admission.   Author: Avi Body, MD 10/18/2023 7:00 PM  For on call review [email or web address].

## 2023-10-18 NOTE — ED Notes (Addendum)
 Back from CT

## 2023-10-18 NOTE — ED Triage Notes (Addendum)
 Pt comes with dizziness, sob and fatigue for several days. Pt states no cp. Pt has colostomy. Pt states liquid diarrhea. Pt states it is hard to talk bc he is so sob.

## 2023-10-18 NOTE — ED Provider Notes (Signed)
 [facility] Provider Note    Event Date/Time   First MD Initiated Contact with Patient 10/18/23 1500     (approximate)   History   Dizziness   HPI Clarence [name] is a 53 y.o. male with history of HTN, HFrEF, Crohn's disease, s/p ostomy, CKD stage III presenting today for fatigue.  Patient states for the past 2 to 3 days he has had worsening fatigue with shortness of breath, lightheadedness when he walks.  Having intermittent nausea and vomiting as well as more liquidy stool output from his ostomy site.  Has had limited p.o. intake.  Denies any specific cough, congestion, chest pain, abdominal pain, dysuria.  He also states having intermittent paresthesias along his left sided extremities but denies any obvious one-sided weakness, speech changes, or vision changes.  This has been occurring on and off over the past week.     Physical Exam   Triage Vital Signs: ED Triage Vitals  Encounter Vitals Group     BP 10/18/23 1437 (!) 105/92     Systolic BP Percentile --      Diastolic BP Percentile --      Pulse Rate 10/18/23 1437 88     Resp 10/18/23 1437 18     Temp 10/18/23 1437 98 F (36.7 C)     Temp src --      SpO2 10/18/23 1437 98 %     Weight 10/18/23 1435 230 lb (104.3 kg)     Height 10/18/23 1435 5\' 10"  (1.778 m)     Head Circumference --      Peak Flow --      Pain Score 10/18/23 1435 4     Pain Loc --      Pain Education --      Exclude from Growth Chart --     Most recent vital signs: Vitals:   10/18/23 1530 10/18/23 1620  BP: (!) 72/55 (!) 79/60  Pulse: 76 68  Resp: 19 16  Temp:    SpO2: 94% 98%   Physical Exam: I have reviewed the vital signs and nursing notes. General: Awake, alert, no acute distress.  Nontoxic appearing. Head:  Atraumatic, normocephalic.   ENT:  EOM intact, PERRL. Oral mucosa is pink and moist with no lesions. Neck: Neck is supple with full range of motion, No meningeal signs. Cardiovascular:  RRR, No  murmurs. Peripheral pulses palpable and equal bilaterally. Respiratory:  Symmetrical chest wall expansion.  No rhonchi, rales, or wheezes.  Good air movement throughout.  No use of accessory muscles.   Musculoskeletal:  No cyanosis or edema. Moving extremities with full ROM Abdomen:  Soft, nontender, nondistended.  Ostomy site around right lower quadrant with liquidy stool output Neuro:  GCS 15, moving all four extremities, interacting appropriately. Speech clear. Psych:  Calm, appropriate.   Skin:  Warm, dry, no rash.     ED Results / Procedures / Treatments   Labs (all labs ordered are listed, but only abnormal results are displayed) Labs Reviewed  CBC - Abnormal; Notable for the following components:      Result Value   RBC 6.02 (*)    All other components within normal limits  COMPREHENSIVE METABOLIC PANEL WITH GFR - Abnormal; Notable for the following components:   Sodium 129 (*)    Potassium 5.8 (*)    CO2 18 (*)    Glucose, Bld 186 (*)    BUN 85 (*)    Creatinine, Ser 3.63 (*)  AST 13 (*)    GFR, Estimated 19 (*)    All other components within normal limits  TROPONIN I (HIGH SENSITIVITY) - Abnormal; Notable for the following components:   Troponin I (High Sensitivity) 36 (*)    All other components within normal limits  LACTIC ACID, PLASMA  LACTIC ACID, PLASMA  TROPONIN I (HIGH SENSITIVITY)     EKG My EKG interpretation: Rate of 81, normal sinus rhythm, normal axis, normal intervals.  No acute ST elevations or depressions   RADIOLOGY Independently interpreted CT abdomen/pelvis and CT head along with chest x-ray with no acute abnormalities   PROCEDURES:  Critical Care performed: No  Procedures   MEDICATIONS ORDERED IN ED: Medications  sodium chloride  0.9 % bolus 1,000 mL (has no administration in time range)  sodium chloride  0.9 % bolus 1,000 mL (1,000 mLs Intravenous New Bag/Given 10/18/23 1534)  ondansetron  (ZOFRAN ) injection 4 mg (4 mg Intravenous  Given 10/18/23 1534)  acetaminophen  (TYLENOL ) tablet 1,000 mg (1,000 mg Oral Given 10/18/23 1655)     IMPRESSION / MDM / ASSESSMENT AND PLAN / ED COURSE  I reviewed the triage vital signs and the nursing notes.                              Differential diagnosis includes, but is not limited to, viral gastroenteritis, colitis, enteritis, acute intra-abdominal infection, electrolyte abnormality, dehydration  Patient's presentation is most consistent with acute presentation with potential threat to life or bodily function.  Patient is a 53 year old male presenting today for fatigue, weakness, nausea with vomiting, and increased liquidy stool output.  He appears slightly lethargic at the bedside and hypotensive which may be secondary to dehydration.  Will give 1 L fluid as well as Zofran .  EKG without obvious ischemia.  Initial troponin of 36.  Patient CMP with notable abnormalities including hyponatremia, hyperkalemia, AKI on CKD.  Will give second liter of fluids as patient still having ongoing hypotension.  CT head, CT abdomen/pelvis, and chest x-ray with no acute findings.  Suspect likely viral GI infection causing nausea, vomiting, diarrhea and subsequent dehydration.  Given noticeable AKI on CKD and electrolyte abnormalities, will admit to hospitalist for ongoing care.  The patient is on the cardiac monitor to evaluate for evidence of arrhythmia and/or significant heart rate changes. Clinical Course as of 10/18/23 1712  Mon Oct 18, 2023  1524 Comprehensive metabolic panel(!) Prominent new AKI on CKD [DW]    Clinical Course User Index [DW] Kandee Orion, MD     FINAL CLINICAL IMPRESSION(S) / ED DIAGNOSES   Final diagnoses:  AKI (acute kidney injury) (HCC)  Nausea vomiting and diarrhea  Dehydration  Hyperkalemia     Rx / DC Orders   ED Discharge Orders     None        Note:  This document was prepared using Dragon voice recognition software and may include unintentional  dictation errors.   Kandee Orion, MD 10/18/23 [id number]

## 2023-10-18 NOTE — Assessment & Plan Note (Addendum)
 Patient denies any chest pain at this time.  Troponin minimally elevated, likely due to hypotension and hypovolemia.  - Continue home aspirin  and antilipid agents

## 2023-10-18 NOTE — Assessment & Plan Note (Signed)
 Per chart review, patient has a history of recurrent nausea/vomiting/increased watery stool output, with most recent episode beginning 2-3 days ago.  ESR and CRP have been negative in the past.  Difficult to say if this is infectious in etiology given previous similar episodes.  - GI panel ordered - Check stool lactoferrin and calprotectin - Hold off on Imodium  pending GI panel - Zofran  as needed

## 2023-10-18 NOTE — ED Notes (Signed)
 Pt to CT

## 2023-10-18 NOTE — ED Notes (Signed)
 Pt bolus disconnected at CT scan and was still disconnected when I checked on pt. Restarted first bolus fluids and tylenol  given

## 2023-10-18 NOTE — Assessment & Plan Note (Signed)
 Chronic recurrent problem, exacerbated by AKI.  - One-time dose of Lokelma 

## 2023-10-18 NOTE — Assessment & Plan Note (Signed)
-   Hold home antihypertensives given hypotension and AKI

## 2023-10-18 NOTE — Assessment & Plan Note (Signed)
 Marked AKI, with history of CKD stage IIIb in the setting of GI losses.  - IV fluids as ordered - Hold home nephrotoxic agents - Repeat BMP in the a.m.

## 2023-10-19 ENCOUNTER — Other Ambulatory Visit: Payer: Self-pay

## 2023-10-19 DIAGNOSIS — N19 Unspecified kidney failure: Secondary | ICD-10-CM

## 2023-10-19 DIAGNOSIS — E875 Hyperkalemia: Secondary | ICD-10-CM

## 2023-10-19 LAB — GASTROINTESTINAL PANEL BY PCR, STOOL (REPLACES STOOL CULTURE)

## 2023-10-19 LAB — CBC WITH DIFFERENTIAL/PLATELET
Abs Immature Granulocytes: 0.02 10*3/uL (ref 0.00–0.07)
Basophils Absolute: 0 10*3/uL (ref 0.0–0.1)
Basophils Relative: 1 %
Eosinophils Absolute: 0.1 10*3/uL (ref 0.0–0.5)
Eosinophils Relative: 2 %
HCT: 43.8 % (ref 39.0–52.0)
Hemoglobin: 15 g/dL (ref 13.0–17.0)
Immature Granulocytes: 0 %
Lymphocytes Relative: 25 %
Lymphs Abs: 2 10*3/uL (ref 0.7–4.0)
MCH: 27.5 pg (ref 26.0–34.0)
MCHC: 34.2 g/dL (ref 30.0–36.0)
MCV: 80.2 fL (ref 80.0–100.0)
Monocytes Absolute: 1 10*3/uL (ref 0.1–1.0)
Monocytes Relative: 12 %
Neutro Abs: 5 10*3/uL (ref 1.7–7.7)
Neutrophils Relative %: 60 %
Platelets: 242 10*3/uL (ref 150–400)
RBC: 5.46 MIL/uL (ref 4.22–5.81)
RDW: 14.3 % (ref 11.5–15.5)
WBC: 8.1 10*3/uL (ref 4.0–10.5)
nRBC: 0 % (ref 0.0–0.2)

## 2023-10-19 LAB — BASIC METABOLIC PANEL WITH GFR
Anion gap: 8 (ref 5–15)
BUN: 69 mg/dL — ABNORMAL HIGH (ref 6–20)
CO2: 15 mmol/L — ABNORMAL LOW (ref 22–32)
Calcium: 8.6 mg/dL — ABNORMAL LOW (ref 8.9–10.3)
Chloride: 109 mmol/L (ref 98–111)
Creatinine, Ser: 2.46 mg/dL — ABNORMAL HIGH (ref 0.61–1.24)
GFR, Estimated: 30 mL/min — ABNORMAL LOW (ref >60)
Glucose, Bld: 101 mg/dL — ABNORMAL HIGH (ref 70–99)
Potassium: 5.9 mmol/L — ABNORMAL HIGH (ref 3.5–5.1)
Sodium: 132 mmol/L — ABNORMAL LOW (ref 135–145)

## 2023-10-19 LAB — C DIFFICILE QUICK SCREEN W PCR REFLEX
C Diff antigen: NEGATIVE
C Diff interpretation: NOT DETECTED
C Diff toxin: NEGATIVE

## 2023-10-19 LAB — HIV ANTIBODY (ROUTINE TESTING W REFLEX): HIV Screen 4th Generation wRfx: NONREACTIVE

## 2023-10-19 LAB — LACTOFERRIN, FECAL, QUALITATIVE: Lactoferrin, Fecal, Qual: POSITIVE — AB

## 2023-10-19 MED ORDER — NITROGLYCERIN 0.4 MG SL SUBL
0.4000 mg | SUBLINGUAL_TABLET | SUBLINGUAL | Status: DC | PRN
  Administered 2023-10-19 – 2023-10-20 (×2): 0.4 mg via SUBLINGUAL
  Filled 2023-10-19 (×2): qty 1

## 2023-10-19 MED ORDER — SODIUM ZIRCONIUM CYCLOSILICATE 10 G PO PACK
10.0000 g | PACK | Freq: Once | ORAL | Status: AC
  Administered 2023-10-19: 10 g via ORAL
  Filled 2023-10-19: qty 1

## 2023-10-19 MED ORDER — ENOXAPARIN SODIUM 60 MG/0.6ML IJ SOSY
0.5000 mg/kg | PREFILLED_SYRINGE | INTRAMUSCULAR | Status: DC
  Administered 2023-10-19: 52.5 mg via SUBCUTANEOUS
  Filled 2023-10-19: qty 0.6

## 2023-10-19 MED ORDER — MORPHINE SULFATE (PF) 2 MG/ML IV SOLN
2.0000 mg | INTRAVENOUS | Status: DC | PRN
  Administered 2023-10-19 – 2023-10-20 (×2): 2 mg via INTRAVENOUS
  Filled 2023-10-19 (×2): qty 1

## 2023-10-19 NOTE — Progress Notes (Signed)
 PROGRESS NOTE    Clarence MARTY  [name]:[id number] DOB: 12-24-1970 DOA: 10/18/2023 PCP: Estil Heman, NP  Chief Complaint  Patient presents with   Dizziness    Hospital Course:  Orest Bio heart failure reduced EF, CAD status post PCI, CKD stage IIIb, Crohn's disease status post colectomy with colostomy, pulmonary hypertension, presents to the ED with nausea, vomiting, increasing ostomy output.  Patient reports that this has been persistent over the 3 days prior to arrival.  He notes his stool is still brown but no evidence of melena or hematochezia.  He denies any chest pain, shortness of breath or lower extremity edema.  In the ED he was hypotensive to 72/55 with a heart rate of 88.  Lab work revealed hyponatremia 129, hyperkalemia 5.8, creatinine 3.63 with a GFR of 19.  Head CT and CT abdomen pelvis without acute findings.  CXR without active disease.  Patient was started on IV fluids and admitted.  Subjective: This morning patient reports his output is slowing down some.  He has not had any nausea or vomiting.  He is requesting a full diet.  He reports this does not feel like a normal Crohn's flare.  He does not want to start steroid therapy  Objective: Vitals:   10/19/23 0638 10/19/23 0742 10/19/23 0802 10/19/23 1105  BP: (!) 128/90 111/68 113/78 97/71  Pulse: 71 69 72 60  Resp:  18 19 18   Temp:  98 F (36.7 C) 98 F (36.7 C) 98.8 F (37.1 C)  TempSrc:      SpO2: 100% 99% 98% 100%  Weight:      Height:        Intake/Output Summary (Last 24 hours) at 10/19/2023 1535 Last data filed at 10/19/2023 1025 Gross per 24 hour  Intake 1240 ml  Output --  Net 1240 ml   Filed Weights   10/18/23 1435  Weight: 104.3 kg    Examination: General exam: Appears calm and comfortable, NAD  Respiratory system: No work of breathing, symmetric chest wall expansion Cardiovascular system: S1 & S2 heard, RRR.  Gastrointestinal system: Abdomen is nondistended, soft and nontender.   Colostomy in place with yellow stool Neuro: Alert and oriented. No focal neurological deficits. Extremities: Symmetric, expected ROM Skin: No rashes, lesions Psychiatry: Demonstrates appropriate judgement and insight. Mood & affect appropriate for situation.   Assessment & Plan:  Principal Problem:   Acute gastroenteritis Active Problems:   Hypovolemic Hypotension   AKI (acute kidney injury) (HCC)   Essential hypertension   Chronic HFrEF (heart failure with reduced ejection fraction) (HCC)   CAD S/P percutaneous coronary angioplasty   Hyperkalemia   Acute gastroenteritis - GI PCR negative, C. difficile negative - Patient has had prior admissions for similar findings.  I do suspect this is a Crohn's flare but he is resistant to steroid therapy at this time - Elevated lactoferrin - Patient reports his nausea vomiting is already resolving - Continue to treat symptomatically for now  Crohn's disease Status post colectomy with colostomy - Monitor colostomy output.  Patient reports that it is higher than normal  Hypovolemic hypotension Hyponatremia - Hypotension on arrival - Blood pressure has been fluid responsive - Continue IV fluids  AKI superimposed on CKD stage IIIb Hyponatremia - Prerenal in the setting of GI losses - Baseline creatinine 1.8, creatinine 3.6 on arrival.  Downtrending now. - Has been receiving IV fluids.  Will discontinue for now given heart failure.  He is tolerating p.o., have encouraged increased  fluid intake - Hold nephrotoxic medications - Creatinine clearance 41 when needed  Hyperkalemia Chronic, recurrent - Currently exacerbated by AKI - As needed Lokelma  - Repeat CMP in a.m.  CAD status post PCI - Patient denies any chest pain currently - Troponin minimally elevated, likely driven by hypotension on arrival - Continue home meds  Chronic heart failure with reduced EF - March 2025: EF 45 to 50%, grade 1 diastolic dysfunction - Clinically dry  on arrival - GDMT as BP tolerates - Daily weights - Hold home diuretics at this time given AKI dehydration  Essential hypertension - Hypotensive on arrival - Hold antihypertensives at this time - Monitor blood pressure closely and resume as needed  DVT prophylaxis: Lovenox    Code Status: Full Code Disposition: Admit inpatient.  Continue to monitor kidney function.  PT evals ordered to assess home health needs  Consultants:    Procedures:    Antimicrobials:  Anti-infectives (From admission, onward)    None       Data Reviewed: I have personally reviewed following labs and imaging studies CBC: Recent Labs  Lab 10/18/23 1437 10/19/23 0512  WBC 9.9 8.1  NEUTROABS  --  5.0  HGB 16.6 15.0  HCT 49.4 43.8  MCV 82.1 80.2  PLT 326 242   Basic Metabolic Panel: Recent Labs  Lab 10/18/23 1442 10/19/23 0512  NA 129* 132*  K 5.8* 5.9*  CL 104 109  CO2 18* 15*  GLUCOSE 186* 101*  BUN 85* 69*  CREATININE 3.63* 2.46*  CALCIUM  9.2 8.6*   GFR: Estimated Creatinine Clearance: 41.5 mL/min (A) (by C-G formula based on SCr of 2.46 mg/dL (H)). Liver Function Tests: Recent Labs  Lab 10/18/23 1442  AST 13*  ALT 20  ALKPHOS 71  BILITOT 0.8  PROT 7.6  ALBUMIN 4.3   CBG: No results for input(s): "GLUCAP" in the last 168 hours.  Recent Results (from the past 240 hours)  Gastrointestinal Panel by PCR , Stool     Status: None   Collection Time: 10/19/23  1:00 AM   Specimen: Stool  Result Value Ref Range Status   Campylobacter species NOT DETECTED NOT DETECTED Final   Plesimonas shigelloides NOT DETECTED NOT DETECTED Final   Salmonella species NOT DETECTED NOT DETECTED Final   Yersinia enterocolitica NOT DETECTED NOT DETECTED Final   Vibrio species NOT DETECTED NOT DETECTED Final   Vibrio cholerae NOT DETECTED NOT DETECTED Final   Enteroaggregative E coli (EAEC) NOT DETECTED NOT DETECTED Final   Enteropathogenic E coli (EPEC) NOT DETECTED NOT DETECTED Final    Enterotoxigenic E coli (ETEC) NOT DETECTED NOT DETECTED Final   Shiga like toxin producing E coli (STEC) NOT DETECTED NOT DETECTED Final   Shigella/Enteroinvasive E coli (EIEC) NOT DETECTED NOT DETECTED Final   Cryptosporidium NOT DETECTED NOT DETECTED Final   Cyclospora cayetanensis NOT DETECTED NOT DETECTED Final   Entamoeba histolytica NOT DETECTED NOT DETECTED Final   Giardia lamblia NOT DETECTED NOT DETECTED Final   Adenovirus F40/41 NOT DETECTED NOT DETECTED Final   Astrovirus NOT DETECTED NOT DETECTED Final   Norovirus GI/GII NOT DETECTED NOT DETECTED Final   Rotavirus A NOT DETECTED NOT DETECTED Final   Sapovirus (I, II, IV, and V) NOT DETECTED NOT DETECTED Final    Comment: Performed at [facility], [address] [place], [place] [address]  C Difficile Quick Screen w PCR reflex     Status: None   Collection Time: 10/19/23 12:32 PM   Specimen: STOOL  Result Value Ref Range Status   C Diff antigen NEGATIVE NEGATIVE Final   C Diff toxin NEGATIVE NEGATIVE Final   C Diff interpretation No C. difficile detected.  Final    Comment: Performed at [facility], [address] [place], [place] [address]     Radiology Studies: CT ABDOMEN PELVIS WO CONTRAST Result Date: 10/18/2023 CLINICAL DATA:  Nausea with vomiting and worsening watery output from the osteotomy. Evaluate for acute intra-infection. EXAM: CT ABDOMEN AND PELVIS WITHOUT CONTRAST TECHNIQUE: Multidetector CT imaging of the abdomen and pelvis was performed following the standard protocol without IV contrast. RADIATION DOSE REDUCTION: This exam was performed according to the departmental dose-optimization program which includes automated exposure control, adjustment of the mA and/or kV according to patient size and/or use of iterative reconstruction technique. COMPARISON:  Abdominopelvic CT 06/12/2023 and 06/06/2023. FINDINGS: Lower chest: The visualized lung bases are now clear with resolution of the previously  demonstrated patchy right lower lobe airspace disease. No significant pleural or pericardial effusion. Hepatobiliary: The liver has a stable, non cirrhotic morphology. No focal hepatic lesions are identified on noncontrast imaging. Incomplete gallbladder distention. No evidence of gallstones, gallbladder wall thickening or biliary dilatation. Pancreas: Unremarkable. No pancreatic ductal dilatation or surrounding inflammatory changes. Spleen: Normal in size without focal abnormality. Adrenals/Urinary Tract: Both adrenal glands appear normal. No evidence of urinary tract calculus, hydronephrosis or perinephric soft tissue stranding. Previously demonstrated small cystic lesions in the lower pole of the right kidney are unchanged; no specific follow-up imaging recommended. The bladder appears unremarkable for its degree of distention. Stomach/Bowel: No enteric contrast administered. 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 No evidence of incarceration or obstruction. Stable submucosal fat deposition within the [employer]. Mildly prominent ingested material within the stomach. Vascular/Lymphatic: There are no enlarged abdominal or pelvic lymph nodes. Minimal aortoiliac atherosclerosis. Reproductive: The prostate gland and seminal vesicles appear unremarkable. 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 No evidence of ascites or pneumoperitoneum. Musculoskeletal: No acute or significant osseous findings. Lower lumbar spondylosis. IMPRESSION: 1. No acute findings or explanation for the patient's symptoms. 2. Stable postoperative changes status post colectomy with right lower quadrant end ileostomy. 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 3. Resolution  of previously demonstrated right lower lobe airspace disease. 4.  Aortic Atherosclerosis (ICD10-I70.0). Electronically Signed   By: Elmon Hagedorn M.D.   On: 10/18/2023 17:02   CT HEAD WO CONTRAST ( ) Result Date: 10/18/2023 CLINICAL DATA:  Dizziness. EXAM: CT HEAD WITHOUT CONTRAST TECHNIQUE: Contiguous axial images were obtained from the base of the skull through the vertex without intravenous contrast. RADIATION DOSE REDUCTION: This exam was performed according to the departmental dose-optimization program which includes automated exposure control, adjustment of the mA and/or kV according to patient size and/or use of iterative reconstruction technique. COMPARISON:  Head CT 09/14/2021 FINDINGS: Brain: No intracranial hemorrhage, mass effect, or midline shift. No hydrocephalus. The basilar cisterns are patent. No evidence of territorial infarct or acute ischemia. No extra-axial or intracranial fluid collection. Vascular: Atherosclerosis of skullbase vasculature without hyperdense vessel or abnormal calcification. Skull: No fracture or focal lesion. Sinuses/Orbits: Paranasal sinuses and mastoid air cells are clear. The visualized orbits are unremarkable. Other: None. IMPRESSION: No acute intracranial abnormality. Electronically Signed   By: Chadwick Colonel M.D.   On: 10/18/2023 16:51  DG Chest Port 1 View Result Date: 10/18/2023 CLINICAL DATA:  Dizziness EXAM: PORTABLE CHEST 1 VIEW COMPARISON:  January 26 25 FINDINGS: The heart size and mediastinal contours are within normal limits. Both lungs are clear. The visualized skeletal structures are unremarkable. IMPRESSION: No active disease. Electronically Signed   By: Fredrich Jefferson M.D.   On: 10/18/2023 15:55    Scheduled Meds:  aspirin  EC  81 mg Oral Daily   atorvastatin   80 mg Oral QPM   clopidogrel   75 mg Oral Daily   enoxaparin  (LOVENOX ) injection  0.5 mg/kg Subcutaneous Q24H   ezetimibe   10 mg Oral Daily   famotidine   20 mg Oral BID    pantoprazole   40 mg Oral Daily   sodium chloride  flush  3 mL Intravenous Q12H   Continuous Infusions:   LOS: 0 days  MDM: Patient is high risk for one or more organ failure.  They necessitate ongoing hospitalization for continued IV therapies and subsequent lab monitoring. Total time spent interpreting labs and vitals, reviewing the medical record, coordinating care amongst consultants and care team members, directly assessing and discussing care with the patient and/or family: 55 min  Kason Benak, DO [facility]  To contact the attending physician between 7A-7P please use Epic Chat. To contact the covering physician during after hours 7P-7A, please review Amion.  10/19/2023, 3:35 PM   *This document has been created with the assistance of dictation software. Please excuse typographical errors. *

## 2023-10-19 NOTE — Progress Notes (Signed)
 PHARMACIST - PHYSICIAN COMMUNICATION  CONCERNING:  Enoxaparin  (Lovenox ) for DVT Prophylaxis    RECOMMENDATION: Patient was prescribed enoxaprin 30mg  q24 hours for VTE prophylaxis.   Filed Weights   10/18/23 1435  Weight: 104.3 kg (230 lb)    Body mass index is 33 kg/m.  Estimated Creatinine Clearance: 41.5 mL/min (A) (by C-G formula based on SCr of 2.46 mg/dL (H)).   Based on [facility] policy patient is candidate for enoxaparin  0.5mg /kg TBW SQ every 24 hours based on BMI being >30.  DESCRIPTION: Pharmacy has adjusted enoxaparin  dose per [facility] policy.  Patient is now receiving enoxaparin  52.5 mg every 24 hours    Thersa Mohiuddin Rodriguez-Guzman PharmD, BCPS 10/19/2023 10:34 AM

## 2023-10-19 NOTE — Plan of Care (Signed)
   Problem: Education: Goal: Knowledge of General Education information will improve Description Including pain rating scale, medication(s)/side effects and non-pharmacologic comfort measures Outcome: Progressing

## 2023-10-20 ENCOUNTER — Other Ambulatory Visit (HOSPITAL_COMMUNITY): Payer: Self-pay

## 2023-10-20 ENCOUNTER — Other Ambulatory Visit: Payer: Self-pay

## 2023-10-20 ENCOUNTER — Encounter: Payer: Self-pay | Admitting: Family

## 2023-10-20 DIAGNOSIS — E860 Dehydration: Secondary | ICD-10-CM

## 2023-10-20 LAB — POTASSIUM: Potassium: 4.6 mmol/L (ref 3.5–5.1)

## 2023-10-20 LAB — BASIC METABOLIC PANEL WITH GFR
Anion gap: 4 — ABNORMAL LOW (ref 5–15)
Anion gap: 6 (ref 5–15)
BUN: 53 mg/dL — ABNORMAL HIGH (ref 6–20)
BUN: 56 mg/dL — ABNORMAL HIGH (ref 6–20)
CO2: 19 mmol/L — ABNORMAL LOW (ref 22–32)
CO2: 18 mmol/L — ABNORMAL LOW (ref 22–32)
Calcium: 9 mg/dL (ref 8.9–10.3)
Calcium: 8.9 mg/dL (ref 8.9–10.3)
Chloride: 113 mmol/L — ABNORMAL HIGH (ref 98–111)
Chloride: 114 mmol/L — ABNORMAL HIGH (ref 98–111)
Creatinine, Ser: 1.84 mg/dL — ABNORMAL HIGH (ref 0.61–1.24)
Creatinine, Ser: 1.92 mg/dL — ABNORMAL HIGH (ref 0.61–1.24)
GFR, Estimated: 43 mL/min — ABNORMAL LOW (ref >60)
GFR, Estimated: 41 mL/min — ABNORMAL LOW (ref >60)
Glucose, Bld: 100 mg/dL — ABNORMAL HIGH (ref 70–99)
Glucose, Bld: 92 mg/dL (ref 70–99)
Potassium: 5.2 mmol/L — ABNORMAL HIGH (ref 3.5–5.1)
Potassium: 5.3 mmol/L — ABNORMAL HIGH (ref 3.5–5.1)
Sodium: 136 mmol/L (ref 135–145)
Sodium: 138 mmol/L (ref 135–145)

## 2023-10-20 LAB — CALPROTECTIN, FECAL: Calprotectin, Fecal: 39 ug/g (ref 0–120)

## 2023-10-20 MED ORDER — ENTRESTO 24-26 MG PO TABS
1.0000 | ORAL_TABLET | Freq: Two times a day (BID) | ORAL | 0 refills | Status: DC
  Filled 2023-10-20: qty 60, 30d supply, fill #0

## 2023-10-20 MED ORDER — ATORVASTATIN CALCIUM 80 MG PO TABS
80.0000 mg | ORAL_TABLET | Freq: Every evening | ORAL | 3 refills | Status: DC
  Filled 2023-10-20: qty 90, 90d supply, fill #0

## 2023-10-20 MED ORDER — DAPAGLIFLOZIN PROPANEDIOL 10 MG PO TABS
10.0000 mg | ORAL_TABLET | Freq: Every day | ORAL | 0 refills | Status: DC
  Filled 2023-10-20 (×2): qty 30, 30d supply, fill #0

## 2023-10-20 MED ORDER — PANTOPRAZOLE SODIUM 40 MG PO TBEC
40.0000 mg | DELAYED_RELEASE_TABLET | Freq: Every day | ORAL | 0 refills | Status: DC
  Filled 2023-10-20: qty 30, 30d supply, fill #0

## 2023-10-20 MED ORDER — CARVEDILOL 3.125 MG PO TABS
3.1250 mg | ORAL_TABLET | Freq: Two times a day (BID) | ORAL | 0 refills | Status: DC
  Filled 2023-10-20: qty 60, 30d supply, fill #0

## 2023-10-20 MED ORDER — ALUMINUM-MAGNESIUM-SIMETHICONE 200-200-20 MG/5ML PO SUSP
30.0000 mL | Freq: Three times a day (TID) | ORAL | 0 refills | Status: AC
  Filled 2023-10-20: qty 355, 3d supply, fill #0

## 2023-10-20 MED ORDER — ISOSORBIDE MONONITRATE ER 60 MG PO TB24
60.0000 mg | ORAL_TABLET | Freq: Every day | ORAL | 0 refills | Status: DC
  Filled 2023-10-20: qty 30, 30d supply, fill #0

## 2023-10-20 MED ORDER — FENOFIBRATE 48 MG PO TABS
48.0000 mg | ORAL_TABLET | Freq: Every day | ORAL | 0 refills | Status: DC
  Filled 2023-10-20: qty 30, 30d supply, fill #0

## 2023-10-20 MED ORDER — SODIUM ZIRCONIUM CYCLOSILICATE 10 G PO PACK
10.0000 g | PACK | Freq: Once | ORAL | Status: AC
  Administered 2023-10-20: 10 g via ORAL
  Filled 2023-10-20: qty 1

## 2023-10-20 MED ORDER — FUROSEMIDE 20 MG PO TABS
20.0000 mg | ORAL_TABLET | Freq: Every day | ORAL | 1 refills | Status: AC | PRN
  Filled 2023-10-20: qty 90, 90d supply, fill #0

## 2023-10-20 MED ORDER — FUROSEMIDE 10 MG/ML IJ SOLN
20.0000 mg | INTRAMUSCULAR | Status: AC
  Administered 2023-10-20: 20 mg via INTRAVENOUS
  Filled 2023-10-20: qty 2

## 2023-10-20 MED ORDER — CLOPIDOGREL BISULFATE 75 MG PO TABS
75.0000 mg | ORAL_TABLET | Freq: Every day | ORAL | 0 refills | Status: DC
  Filled 2023-10-20: qty 30, 30d supply, fill #0

## 2023-10-20 NOTE — TOC Transition Note (Signed)
 Transition of Care [facility]) - Discharge Note   Patient Details  Name: Clarence [name] MRN: [id number] Date of Birth: [birth year]  [facility]) CM/SW Contact:  Odilia Bennett, LCSW Phone Number: 10/20/2023, 11:41 AM   Clinical Narrative: Patient has orders to discharge home today. CSW provided packet for free/low-cost healthcare in [facility] and intake paperwork for the [employer]. No further concerns. Patient will drive himself home. CSW signing off.    Final next level of care: Home/Self Care Barriers to Discharge: Barriers Resolved   Patient Goals and CMS Choice            Discharge Placement                Patient to be transferred to facility by: Self   Patient and family notified of of transfer: 10/20/23  Discharge Plan and Services Additional resources added to the After Visit Summary for       Post Acute Care Choice: NA                               Social Drivers of Health (SDOH) Interventions SDOH Screenings   Food Insecurity: No Food Insecurity (10/18/2023)  Housing: Low Risk  (10/19/2023)  Transportation Needs: No Transportation Needs (10/19/2023)  Utilities: Not At Risk (10/19/2023)  Depression (PHQ2-9): Low Risk  (01/05/2023)  Financial Resource Strain: High Risk (01/29/2022)  Social Connections: Unknown (09/29/2021)   [facility] from [facility]  Tobacco Use: Medium Risk (10/18/2023)     Readmission Risk Interventions    10/20/2023   10:51 AM 06/14/2023    1:23 PM  Readmission Risk Prevention Plan  Transportation Screening Complete Complete  PCP or Specialist Appt within 3-5 Days Complete Complete  Social Work Consult for Recovery Care Planning/Counseling Complete Complete  Palliative Care Screening Not Applicable Not Applicable  Medication Review Oceanographer) Complete Complete

## 2023-10-20 NOTE — Progress Notes (Signed)
 Heart Failure Navigator Progress Note  Assessed for Heart & Vascular TOC clinic readiness.  Does not meet criteria due to Advanced Heart Failure Team patient of Dr. Darlis Eisenmenger, MD.  Navigator will sign off at this time.  Celedonio Coil, RN, BSN [facility] Heart Failure Navigator Secure Chat Only

## 2023-10-20 NOTE — TOC Initial Note (Signed)
 Transition of Care [facility]) - Initial/Assessment Note    Patient Details  Name: Clarence [name] MRN: [id number] Date of Birth: February 18, 1971  Transition of Care [facility]) CM/SW Contact:    Odilia Bennett, LCSW Phone Number: 10/20/2023, 10:52 AM  Clinical Narrative:  Readmission prevention screen complete. CSW met with patient. No family at bedside. CSW introduced role and explained that discharge planning would be discussed. PCP is Contractor, NP but patient is unable to see her right now because he lost his insurance. He is supposed to get Medicare in September. CSW will add [facility] information to his chart to go home with him. [facility] [address] but [facility] at discharge. Patient lives with his uncle. No home health or DME use prior to admission. No further concerns. CSW will continue to follow patient for support and facilitate return home once stable. He will drive himself home at discharge. His car is in the ED parking lot.                Expected Discharge Plan: Home/Self Care Barriers to Discharge: Continued Medical Work up   Patient Goals and CMS Choice            Expected Discharge Plan and Services     Post Acute Care Choice: NA Living arrangements for the past 2 months: Single Family Home                                      Prior Living Arrangements/Services Living arrangements for the past 2 months: Single Family Home Lives with:: Relatives Patient language and need for interpreter reviewed:: Yes Do you feel safe going back to the place where you live?: Yes      Need for Family Participation in Patient Care: Yes (Comment) Care giver support system in place?: Yes (comment)   Criminal Activity/Legal Involvement Pertinent to Current Situation/Hospitalization: No - Comment as needed  Activities of Daily Living   ADL Screening (condition at time of admission) Independently performs ADLs?: Yes  (appropriate for developmental age) Is the patient deaf or have difficulty hearing?: No Does the patient have difficulty seeing, even when wearing glasses/contacts?: No Does the patient have difficulty concentrating, remembering, or making decisions?: No  Permission Sought/Granted                  Emotional Assessment Appearance:: Appears stated age Attitude/Demeanor/Rapport: Engaged, Gracious Affect (typically observed): Accepting, Appropriate, Calm, Pleasant Orientation: : Oriented to Self, Oriented to Place, Oriented to  Time, Oriented to Situation Alcohol / Substance Use: Not Applicable Psych Involvement: No (comment)  Admission diagnosis:  Dehydration [E86.0] Acute gastroenteritis [K52.9] Hyperkalemia [E87.5] AKI (acute kidney injury) (HCC) [N17.9] Nausea vomiting and diarrhea [R11.2, R19.7] Patient Active Problem List   Diagnosis Date Noted   Acute gastroenteritis 10/18/2023   Hyperkalemia 10/18/2023   Gastroenteritis 06/12/2023   Unstable angina (HCC) 03/03/2023   AKI (acute kidney injury) (HCC) 03/03/2023   CKD stage 3a, GFR 45-59 ml/min (HCC) 03/03/2023   Elevated troponin 03/03/2023   CAD S/P percutaneous coronary angioplasty 03/03/2023   CHF (congestive heart failure) (HCC) 09/29/2022   Tinea cruris 09/14/2022   Folliculitis 09/14/2022   Chest pain 09/11/2022   Hypovolemic Hypotension 09/11/2022   Functional diarrhea 09/11/2022   Dizziness 09/10/2022   Coronary artery disease of native artery of native heart with stable  angina pectoris (HCC) 03/12/2022   Mixed hyperlipidemia 03/12/2022   Excessive daytime sleepiness 10/31/2021   Ischemic cardiomyopathy 10/21/2021   Pulmonary hypertension (HCC) 10/21/2021   Demand ischemia , possible NSTEMI 10/21/2021   NSTEMI (non-ST elevated myocardial infarction) (HCC) 10/21/2021   Chronic HFrEF (heart failure with reduced ejection fraction) (HCC) 10/07/2021   Dyslipidemia 09/09/2021   Essential hypertension 08/04/2021    Crohn's disease (HCC) 08/04/2021   Pain in joint of left shoulder 08/30/2017   Impingement syndrome of left shoulder region 08/30/2017   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 [place] [address] Phone: [phone] Fax: [phone]  [facility] [address] [place] [address] Phone: [phone] Fax: [phone]     Social Drivers of Health (SDOH) Social History: SDOH Screenings   Food Insecurity: No Food Insecurity (10/18/2023)  Housing: Low Risk  (10/19/2023)  Transportation Needs: No Transportation Needs (10/19/2023)  Utilities: Not At Risk (10/19/2023)  Depression (PHQ2-9): Low Risk  (01/05/2023)  Financial Resource Strain: High Risk (01/29/2022)  Social Connections: Unknown (09/29/2021)   Received from [facility]  Tobacco Use: Medium Risk (10/18/2023)   SDOH Interventions:     Readmission Risk Interventions    10/20/2023   10:51 AM 06/14/2023    1:23 PM  Readmission Risk Prevention Plan  Transportation Screening Complete Complete  PCP or Specialist Appt within 3-5 Days Complete Complete  Social Work Consult for Recovery Care Planning/Counseling Complete Complete  Palliative Care Screening Not Applicable Not Applicable  Medication Review Oceanographer) Complete Complete

## 2023-10-20 NOTE — Progress Notes (Signed)
 Discharge teaching complete. Meds, diet, activity, follow up appointments, CHF education all reviewed and all questions answered. Copy of instructions given to patient and meds brought to bedside by pharmacy.

## 2023-10-20 NOTE — Progress Notes (Addendum)
 Heart Failure Stewardship Pharmacy Note  PCP: Ngetich, Elijio Guadeloupe, NP PCP-Cardiologist: Sammy Crisp, MD  HPI: Clarence [name] is a 53 y.o. male with chronic systolic CHF, Crohns disease, CKD stage 3, NSTEMI, CAD, and HTN who presented with nausea, vomiting, and increased ostomy output. On admission, negative C. difficile, BNP was not measured, HS-troponin was 36 >34, A1c was 6.6, GI panel was negative, positive fecal lactoferrin, and lactic acid was 1.4. Chest x-ray noted no active disease. CT abdomen pelvis with no acute abnormalities.  Pertinent cardiac history: Echo in 3/23 showed EF 30-35%, moderate LVH, normal RV. LHC showed occluded RCA with collaterals, 60% mLAD, 65% mLCx, 90% mid OM1. RHC showed elevated filling pressures and low CI (2).   Pertinent Lab Values: Creat  Date Value Ref Range Status  03/09/2023 2.11 (H) 0.70 - 1.30 mg/dL Final   Creatinine, Ser  Date Value Ref Range Status  10/19/2023 2.46 (H) 0.61 - 1.24 mg/dL Final   BUN  Date Value Ref Range Status  10/19/2023 69 (H) 6 - 20 mg/dL Final  91/47/8295 25 (H) 6 - 24 mg/dL Final   Potassium  Date Value Ref Range Status  10/19/2023 5.9 (H) 3.5 - 5.1 mmol/L Final   Sodium  Date Value Ref Range Status  10/19/2023 132 (L) 135 - 145 mmol/L Final  03/23/2023 144 134 - 144 mmol/L Final   Brain Natriuretic Peptide  Date Value Ref Range Status  10/02/2022 78 <100 pg/mL Final    Comment:    . BNP levels increase with age in the general population with the highest values seen in individuals greater than 35 years of age. Reference: J. Am. [employer]etta Cons. Cardiol. 2002; 62:130-865. .    B Natriuretic Peptide  Date Value Ref Range Status  06/24/2023 24.7 0.0 - 100.0 pg/mL Final    Comment:    Performed at [facility], [address] [place], [place] [address]   Magnesium   Date Value Ref Range Status  06/15/2023 2.6 (H) 1.7 - 2.4 mg/dL Final    Comment:    Performed at [facility], [address] [place], [place] [address]   Hgb A1c MFr Bld  Date Value Ref Range Status  10/18/2023 6.6 (H) 4.8 - 5.6 % Final    Comment:    (NOTE) Diagnosis of Diabetes The following HbA1c ranges recommended by the American Diabetes Association (ADA) may be used as an aid in the diagnosis of diabetes mellitus.  Hemoglobin             Suggested A1C NGSP%              Diagnosis  <5.7                   Non Diabetic  5.7-6.4                Pre-Diabetic  >6.4                   Diabetic  <7.0                   Glycemic control for                       adults with diabetes.     TSH  Date Value Ref Range Status  03/09/2023 0.84 0.40 - 4.50 mIU/L Final    Vital Signs: Admission weight: 229 lbs Temp:  [98 F (36.7 C)-98.8 F (37.1 C)] 98.5  F (36.9 C) (06/04 0357) Pulse Rate:  [60-73] 65 (06/04 0357) Cardiac Rhythm: Normal sinus rhythm;Bundle branch block (06/03 1901) Resp:  [18-19] 18 (06/04 0357) BP: (97-128)/(67-88) 128/88 (06/04 0357) SpO2:  [98 %-100 %] 99 % (06/04 0357) Weight:  [103.9 kg (229 lb 0.9 oz)] 103.9 kg (229 lb 0.9 oz) (06/04 0500)  Intake/Output Summary (Last 24 hours) at 10/20/2023 0800 Last data filed at 10/19/2023 1914 Gross per 24 hour  Intake 480 ml  Output --  Net 480 ml    Current Heart Failure Medications:  Loop diuretic: none Beta-Blocker: none ACEI/ARB/ARNI: none MRA: none SGLT2i: none Other: none  Prior to admission Heart Failure Medications:  Loop diuretic: furosemide  20 mg prn Beta-Blocker: carvedilol  3.125 mg BID ACEI/ARB/ARNI: Entresto  24-26 mg BID MRA: spironolactone  25 mg daily SGLT2i: Jardiance  10 mg daily Other: isosorbide  ER 90 mg daily  Plan: 1) Medication changes recommended at this time: -CHF medications have been resumed now that the patient is euvolemic and normotensive. Medications have been sent to the [facility] pharmacy. Patient may require transition from Entresto  to losartan  while awaiting insurance coverage  determination. Will ensure 1 week clinic follow-up and involve CHF social work team.  2) Patient assistance: -Patient now qualifies for patient assistance given insurance has been terminated.  3) Education: - Patient has been educated on current HF medications and potential additions to HF medication regimen - Patient verbalizes understanding that over the next few months, these medication doses may change and more medications may be added to optimize HF regimen - Patient has been educated on basic disease state pathophysiology and goals of therapy -Specific education provided to hold furosemide  when experiencing vomiting and diarrhea.  Medication Assistance / Insurance Benefits Check: Does the patient have prescription insurance?    Type of insurance plan:  Does the patient qualify for medication assistance through manufacturers or grants? Pending  Outpatient Pharmacy: Prior to admission outpatient pharmacy: [facility]      Please do not hesitate to reach out with questions or concerns,  Bevely Brush, PharmD, CPP, BCPS Heart Failure Pharmacist  Phone - [phone] 10/20/2023 12:18 PM

## 2023-10-20 NOTE — Progress Notes (Signed)
 Patient discharged home via wheelchair with volunteer to car in parking lot.

## 2023-10-20 NOTE — Plan of Care (Signed)

## 2023-10-20 NOTE — Evaluation (Signed)
 Physical Therapy Evaluation Patient Details Name: Clarence [name] MRN: [id number] DOB: 13-Jul-1970 Today's Date: 10/20/2023  History of Present Illness  53 y.o. male with medical history significant of HFrEF, CAD s/p PCI, CKD stage IIIb, Crohn's disease s/p colectomy with colostomy, pulmonary hypertension, who presents to the ED due to nausea, vomiting and loose stool.  Feeling better symptomatically at time of PT exam.  Clinical Impression  Pt did well with PT exam, reports he has not vomited in a while and generally is feeling well.  He did complain of mild chest pain/pressure with prolonged ambulation; HR rose from 70s to 90s with SpO2 remaining in the high 90s on room air.  Pt did not need AD and had no LOBs, appropriate speed and confidence with prolonged ambulation and negotiated up/down steps w/o issue.  Overall pt feeling close to his functional baseline, agrees that he does not need further PT - will sign off and complete orders.        If plan is discharge home, recommend the following:     Can travel by private vehicle        Equipment Recommendations  none  Recommendations for Other Services       Functional Status Assessment Patient has not had a recent decline in their functional status     Precautions / Restrictions Precautions Precautions: None Restrictions Weight Bearing Restrictions Per Provider Order: No      Mobility  Bed Mobility Overal bed mobility: Independent                  Transfers Overall transfer level: Independent Equipment used: None                    Ambulation/Gait Ambulation/Gait assistance: Independent Gait Distance (Feet): 350 Feet Assistive device: None         General Gait Details: Pt able to ambulate with confident cadence and speed, no LOBs or safety issues.  HR rose from 70s to 90s with mild chest pressure but ability to continue walking and talking t/o the effort.  Stairs Stairs: Yes Stairs assistance:  Independent Stair Management: One rail Right Number of Stairs: 12 General stair comments: easily and confidently negotiates up/down steps  Wheelchair Mobility     Tilt Bed    Modified Rankin (Stroke Patients Only)       Balance Overall balance assessment: Independent                                           Pertinent Vitals/Pain Pain Assessment Pain Assessment: Faces Faces Pain Scale: Hurts a little bit Pain Location: reports mild chest pain with prolonged activity    Home Living Family/patient expects to be discharged to:: Private residence Living Arrangements: Other relatives (uncle) Available Help at Discharge: Available 24 hours/day Type of Home: House Home Access: Stairs to enter Entrance Stairs-Rails: Right Entrance Stairs-Number of Steps: 6   Home Layout: One level        Prior Function Prior Level of Function : Independent/Modified Independent             Mobility Comments: drives, runs errands, stays active ADLs Comments: independent     Extremity/Trunk Assessment   Upper Extremity Assessment Upper Extremity Assessment: Overall WFL for tasks assessed    Lower Extremity Assessment Lower Extremity Assessment: Overall WFL for tasks assessed  Communication   Communication Communication: No apparent difficulties    Cognition Arousal: Alert Behavior During Therapy: WFL for tasks assessed/performed   PT - Cognitive impairments: No apparent impairments                         Following commands: Intact       Cueing       General Comments General comments (skin integrity, edema, etc.): Pt safe and confident with mobility, mild fatigue    Exercises     Assessment/Plan    PT Assessment Patient does not need any further PT services  PT Problem List         PT Treatment Interventions      PT Goals (Current goals can be found in the Care Plan section)  Acute Rehab PT Goals Patient Stated Goal:  go home PT Goal Formulation: All assessment and education complete, DC therapy    Frequency       Co-evaluation               AM-PAC PT "6 Clicks" Mobility  Outcome Measure Help needed turning from your back to your side while in a flat bed without using bedrails?: None Help needed moving from lying on your back to sitting on the side of a flat bed without using bedrails?: None Help needed moving to and from a bed to a chair (including a wheelchair)?: None Help needed standing up from a chair using your arms (e.g., wheelchair or bedside chair)?: None Help needed to walk in hospital room?: None Help needed climbing 3-5 steps with a railing? : None 6 Click Score: 24    End of Session   Activity Tolerance: Patient tolerated treatment well Patient left: in chair;with call bell/phone within reach Nurse Communication: Mobility status PT Visit Diagnosis: Unsteadiness on feet (R26.81);Muscle weakness (generalized) (M62.81)    Time: 0933-1000 PT Time Calculation (min) (ACUTE ONLY): 27 min   Charges:   PT Evaluation $PT Eval Low Complexity: 1 Low   PT General Charges $$ ACUTE PT VISIT: 1 Visit         Darice Edelman, DPT 10/20/2023, 11:09 AM

## 2023-10-20 NOTE — Progress Notes (Signed)
 Patient will have repeat BMP at 2:30 and can be discharged after if K+ is trending down.

## 2023-10-21 ENCOUNTER — Telehealth: Payer: Self-pay

## 2023-10-21 ENCOUNTER — Telehealth (HOSPITAL_COMMUNITY): Payer: Self-pay | Admitting: Licensed Clinical Social Worker

## 2023-10-21 ENCOUNTER — Other Ambulatory Visit: Payer: Self-pay

## 2023-10-21 NOTE — Transitions of Care (Post Inpatient/ED Visit) (Signed)
   10/21/2023  Name: Clarence [name] MRN: [id number] DOB: 07/11/1970  Today's TOC FU Call Status: Today's TOC FU Call Status:: Unsuccessful Call (1st Attempt) Unsuccessful Call (1st Attempt) Date: 10/21/23  Attempted to reach the patient regarding the most recent Inpatient/ED visit.  Follow Up Plan: Additional outreach attempts will be made to reach the patient to complete the Transitions of Care (Post Inpatient/ED visit) call.   Tonia Frankel RN, CCM [place]  VBCI-Population Health RN Care Manager [phone]

## 2023-10-21 NOTE — Telephone Encounter (Signed)
 CSW referred to assist patient with resources for insurance. CSW contacted patient and left message for return call. [name], LCSW, CCSW-MCS [phone]

## 2023-10-21 NOTE — Transitions of Care (Post Inpatient/ED Visit) (Signed)
   10/21/2023  Name: Clarence [name] MRN: [id number] DOB: 1970-09-17  Today's TOC FU Call Status: Today's TOC FU Call Status:: Unsuccessful Call (2nd Attempt) Unsuccessful Call (2nd Attempt) Date: 10/21/23  Attempted to reach the patient regarding the most recent Inpatient/ED visit.  Follow Up Plan: Additional outreach attempts will be made to reach the patient to complete the Transitions of Care (Post Inpatient/ED visit) call.   Tonia Frankel RN, CCM [place]  VBCI-Population Health RN Care Manager [phone]

## 2023-10-22 ENCOUNTER — Telehealth: Payer: Self-pay

## 2023-10-22 ENCOUNTER — Telehealth (HOSPITAL_COMMUNITY): Payer: Self-pay | Admitting: Licensed Clinical Social Worker

## 2023-10-22 NOTE — Telephone Encounter (Signed)
 CSW contacted patient to discuss insurance needs. Patient states he will start medicare in September and was recently terminated from medicaid due to increase in his SSD check. CSW discussed [employer] program as well as AHA site to explore insurance options. Patient will follow up and return call to CSW if needed. [name], LCSW, CCSW-MCS [phone]

## 2023-10-22 NOTE — Transitions of Care (Post Inpatient/ED Visit) (Signed)
   10/22/2023  Name: Clarence [name] MRN: [id number] DOB: 1970-06-30  Today's TOC FU Call Status:    Attempted to reach the patient regarding the most recent Inpatient/ED visit.  Follow Up Plan: No further outreach attempts will be made at this time. We have been unable to contact the patient.  Tonia Frankel RN, CCM [place]  VBCI-Population Health RN Care Manager [phone]

## 2023-10-22 NOTE — Discharge Summary (Signed)
 Physician Discharge Summary   Patient: Clarence [name] MRN: [id number] DOB: [birth year]  Admit date:     10/18/2023  Discharge date: 10/20/2023  Discharge Physician: Clarence [name]   PCP: Clarence Heman, NP   Recommendations at discharge:   Follow-up with outpatient providers as requested  Discharge Diagnoses: Principal Problem:   Acute gastroenteritis Active Problems:   Hypovolemic Hypotension   AKI (acute kidney injury) (HCC)   Essential hypertension   Chronic HFrEF (heart failure with reduced ejection fraction) (HCC)   CAD S/P percutaneous coronary angioplasty   Hyperkalemia   Dehydration  Resolved Problems:   Uremia  Hospital Course: Assessment and Plan:  53 year old male with a known history of heart failure reduced EF, CAD status post PCI, CKD stage IIIb, Crohn's disease status post colectomy with colostomy, pulmonary hypertension, presents to the ED with nausea, vomiting, increasing ostomy output.  Acute viral gastroenteritis - GI PCR negative, C. difficile negative - Patient has had prior admissions for similar findings.  Now resolved.  He is tolerating diet  Crohn's disease Status post colectomy with colostomy   Hypovolemic hypotension Hyponatremia - Hypotension on arrival - Resolved with hydration   AKI superimposed on CKD stage IIIb Hyponatremia - Prerenal in the setting of GI losses - Resolved with hydration   Hyperkalemia Chronic, recurrent - Currently exacerbated by AKI - Improved with Lokelma  and hydration   CAD status post PCI - Patient denies any chest pain currently - Troponin minimally elevated, likely driven by hypotension on arrival   Chronic heart failure with reduced EF - March 2025: EF 45 to 50%, grade 1 diastolic dysfunction - Well compensated at this time   Essential hypertension - Stable         Disposition: Home Diet recommendation:  Discharge Diet Orders (From admission, onward)     Start     Ordered   10/20/23 0000   Diet - low sodium heart healthy        10/20/23 1120           Carb modified diet DISCHARGE MEDICATION: Allergies as of 10/20/2023       Reactions   Ceftriaxone  Rash   Macular rash with itchness   Ibuprofen Other (See Comments)   History of GIB with ibuprofen    Sulfacetamide Other (See Comments)   Neomycin-bacitracin Zn-polymyx    Other reaction(s): Other (See Comments)   Spironolactone     Hyperkalemia        Medication List     STOP taking these medications    acetaminophen  325 MG tablet Commonly known as: TYLENOL    aspirin  EC 81 MG tablet   ezetimibe  10 MG tablet Commonly known as: ZETIA    famotidine  20 MG tablet Commonly known as: PEPCID    Jardiance  10 MG Tabs tablet Generic drug: empagliflozin  Replaced by: Farxiga  10 MG Tabs tablet   metoCLOPramide  10 MG tablet Commonly known as: REGLAN    nitroGLYCERIN  0.4 MG SL tablet Commonly known as: Nitrostat    ondansetron  4 MG disintegrating tablet Commonly known as: ZOFRAN -ODT   Praluent  150 MG/ML Soaj Generic drug: Alirocumab    spironolactone  25 MG tablet Commonly known as: ALDACTONE    Wegovy  0.5 MG/0.5ML Soaj Generic drug: Semaglutide -Weight Management       TAKE these medications    atorvastatin  80 MG tablet Commonly known as: LIPITOR  Take 1 tablet (80 mg total) by mouth every evening.   carvedilol  3.125 MG tablet Commonly known as: COREG  Take 1 tablet (3.125 mg total) by mouth 2 (two) times daily.  clopidogrel  75 MG tablet Commonly known as: PLAVIX  Take 1 tablet (75 mg total) by mouth daily.   Entresto  24-26 MG Generic drug: sacubitril -valsartan  Take 1 tablet by mouth 2 (two) times daily.   Farxiga  10 MG Tabs tablet Generic drug: dapagliflozin  propanediol Take 1 tablet (10 mg total) by mouth daily. Replaces: Jardiance  10 MG Tabs tablet   fenofibrate  48 MG tablet Commonly known as: Tricor  Take 1 tablet (48 mg total) by mouth daily.   furosemide  20 MG tablet Commonly known as:  LASIX  Take 1 tablet (20 mg total) by mouth daily as needed. For weight gain of 3 lb in 24 hours or 5 lbs in one week   Geri-Lanta 200-200-20 MG/5ML suspension Generic drug: alum & mag hydroxide-simeth Take 30 mLs by mouth 4 (four) times daily -  before meals and at bedtime.   isosorbide  mononitrate 60 MG 24 hr tablet Commonly known as: IMDUR  Take 1 tablet (60 mg total) by mouth daily. What changed: how much to take   pantoprazole  40 MG tablet Commonly known as: PROTONIX  Take 1 tablet (40 mg total) by mouth daily.        Follow-up Information     [name], Clarence C, NP. Call on 10/27/2023.   Specialty: Family Medicine Why: [facility] Discharge F/UP. Next Wedesday at 11:20 am. Contact information: [address] [place] [address] [phone]         [facility]. Go on 10/26/2023.   Specialty: Cardiology Why: Hospital Follow-up  Please all meds to follow-up appoinment 10/26/23 @1 :45 PM [facility], [address], [employer] parking @ the [employer] account planner information: [address] [place]  [phone]               Discharge Exam: Clarence [name] Weights   10/18/23 1435 10/20/23 0500  Weight: 104.3 kg 103.9 kg   General exam: Appears calm and comfortable, NAD  Respiratory system: No work of breathing, symmetric chest wall expansion Cardiovascular system: S1 & S2 heard, RRR.  Gastrointestinal system: Abdomen is nondistended, soft and nontender.  Colostomy in place with yellow stool Neuro: Alert and oriented. No focal neurological deficits. Extremities: Symmetric, expected ROM Skin: No rashes, lesions Psychiatry: Demonstrates appropriate judgement and insight. Mood & affect appropriate for situation.   Condition at discharge: fair  The results of significant diagnostics from this hospitalization (including imaging, microbiology, ancillary and laboratory) are listed  below for reference.   Imaging Studies: CT ABDOMEN PELVIS WO CONTRAST Result Date: 10/18/2023 CLINICAL DATA:  Nausea with vomiting and worsening watery output from the osteotomy. Evaluate for acute intra-infection. EXAM: CT ABDOMEN AND PELVIS WITHOUT CONTRAST TECHNIQUE: Multidetector CT imaging of the abdomen and pelvis was performed following the standard protocol without IV contrast. RADIATION DOSE REDUCTION: This exam was performed according to the departmental dose-optimization program which includes automated exposure control, adjustment of the mA and/or kV according to patient size and/or use of iterative reconstruction technique. COMPARISON:  Abdominopelvic CT 06/12/2023 and 06/06/2023. FINDINGS: Lower chest: The visualized lung bases are now clear with resolution of the previously demonstrated patchy right lower lobe airspace disease. No significant pleural or pericardial effusion. Hepatobiliary: The liver has a stable, non cirrhotic morphology. No focal hepatic lesions are identified on noncontrast imaging. Incomplete gallbladder distention. No evidence of gallstones, gallbladder wall thickening or biliary dilatation. Pancreas: Unremarkable. No pancreatic ductal dilatation or surrounding inflammatory changes. Spleen: Normal in size without focal abnormality. Adrenals/Urinary Tract: Both adrenal  glands appear normal. No evidence of urinary tract calculus, hydronephrosis or perinephric soft tissue stranding. Previously demonstrated small cystic lesions in the lower pole of the right kidney are unchanged; no specific follow-up imaging recommended. The bladder appears unremarkable for its degree of distention. Stomach/Bowel: No enteric contrast administered. 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 No evidence of incarceration or obstruction. Stable submucosal fat deposition within the  [employer]. Mildly prominent ingested material within the stomach. Vascular/Lymphatic: There are no enlarged abdominal or pelvic lymph nodes. Minimal aortoiliac atherosclerosis. Reproductive: The prostate gland and seminal vesicles appear unremarkable. 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 No evidence of ascites or pneumoperitoneum. Musculoskeletal: No acute or significant osseous findings. Lower lumbar spondylosis. IMPRESSION: 1. No acute findings or explanation for the patient's symptoms. 2. Stable postoperative changes status post colectomy with right lower quadrant end ileostomy. 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 3. Resolution of previously demonstrated right lower lobe airspace disease. 4.  Aortic Atherosclerosis (ICD10-I70.0). Electronically Signed   By: Elmon Hagedorn M.D.   On: 10/18/2023 17:02   CT HEAD WO CONTRAST ( ) Result Date: 10/18/2023 CLINICAL DATA:  Dizziness. EXAM: CT HEAD WITHOUT CONTRAST TECHNIQUE: Contiguous axial images were obtained from the base of the skull through the vertex without intravenous contrast. RADIATION DOSE REDUCTION: This exam was performed according to the departmental dose-optimization program which includes automated exposure control, adjustment of the mA and/or kV according to patient size and/or use of iterative reconstruction technique. COMPARISON:  Head CT 09/14/2021 FINDINGS: Brain: No intracranial hemorrhage, mass effect, or midline shift. No hydrocephalus. The basilar cisterns are patent. No evidence of territorial infarct or acute ischemia. No extra-axial or intracranial fluid collection. Vascular: Atherosclerosis of skullbase vasculature without hyperdense vessel or abnormal calcification. Skull: No fracture or focal lesion. Sinuses/Orbits: Paranasal sinuses and mastoid air cells are clear. The  visualized orbits are unremarkable. Other: None. IMPRESSION: No acute intracranial abnormality. Electronically Signed   By: Chadwick Colonel M.D.   On: 10/18/2023 16:51   DG Chest Port 1 View Result Date: 10/18/2023 CLINICAL DATA:  Dizziness EXAM: PORTABLE CHEST 1 VIEW COMPARISON:  January 26 25 FINDINGS: The heart size and mediastinal contours are within normal limits. Both lungs are clear. The visualized skeletal structures are unremarkable. IMPRESSION: No active disease. Electronically Signed   By: Fredrich Jefferson M.D.   On: 10/18/2023 15:55    Microbiology: Results for orders placed or performed during the hospital encounter of 10/18/23  Gastrointestinal Panel by PCR , Stool     Status: None   Collection Time: 10/19/23  1:00 AM   Specimen: Stool  Result Value Ref Range Status   Campylobacter species NOT DETECTED NOT DETECTED Final   Plesimonas shigelloides NOT DETECTED NOT DETECTED Final   Salmonella species NOT DETECTED NOT DETECTED Final   Yersinia enterocolitica NOT DETECTED NOT DETECTED Final   Vibrio species NOT DETECTED NOT DETECTED Final   Vibrio cholerae NOT DETECTED NOT DETECTED Final   Enteroaggregative E coli (EAEC) NOT DETECTED NOT DETECTED Final   Enteropathogenic E coli (EPEC) NOT DETECTED NOT DETECTED Final   Enterotoxigenic E coli (ETEC) NOT DETECTED NOT DETECTED Final   Shiga like toxin producing E coli (STEC) NOT DETECTED NOT DETECTED Final   Shigella/Enteroinvasive E coli (EIEC) NOT DETECTED NOT DETECTED Final   Cryptosporidium  NOT DETECTED NOT DETECTED Final   Cyclospora cayetanensis NOT DETECTED NOT DETECTED Final   Entamoeba histolytica NOT DETECTED NOT DETECTED Final   Giardia lamblia NOT DETECTED NOT DETECTED Final   Adenovirus F40/41 NOT DETECTED NOT DETECTED Final   Astrovirus NOT DETECTED NOT DETECTED Final   Norovirus GI/GII NOT DETECTED NOT DETECTED Final   Rotavirus A NOT DETECTED NOT DETECTED Final   Sapovirus (I, II, IV, and V) NOT DETECTED NOT DETECTED  Final    Comment: Performed at [facility], [address] [place], [place] [address]  Calprotectin, Fecal     Status: None   Collection Time: 10/19/23  1:00 AM   Specimen: Stool  Result Value Ref Range Status   Calprotectin, Fecal 39 0 - 120 ug/g Final    Comment: (NOTE) Concentration     Interpretation   Follow-Up < 5 - 50 ug/g     Normal           None >50 -120 ug/g     Borderline       Re-evaluate in 4-6 weeks    >120 ug/g     Abnormal         Repeat as clinically                                   indicated Performed At: [facility] [address] [place], [place] [id number] Pearlean Botts MD AO:[id number]   C Difficile Quick Screen w PCR reflex     Status: None   Collection Time: 10/19/23 12:32 PM   Specimen: STOOL  Result Value Ref Range Status   C Diff antigen NEGATIVE NEGATIVE Final   C Diff toxin NEGATIVE NEGATIVE Final   C Diff interpretation No C. difficile detected.  Final    Comment: Performed at [facility], [address] [place], [place] [address]    Labs: CBC: Recent Labs  Lab 10/18/23 1437 10/19/23 0512  WBC 9.9 8.1  NEUTROABS  --  5.0  HGB 16.6 15.0  HCT 49.4 43.8  MCV 82.1 80.2  PLT 326 242   Basic Metabolic Panel: Recent Labs  Lab 10/18/23 1442 10/19/23 0512 10/20/23 0811 10/20/23 1242 10/20/23 1429  NA 129* 132* 136 138  --   K 5.8* 5.9* 5.2* 5.3* 4.6  CL 104 109 113* 114*  --   CO2 18* 15* 19* 18*  --   GLUCOSE 186* 101* 100* 92  --   BUN 85* 69* 53* 56*  --   CREATININE 3.63* 2.46* 1.84* 1.92*  --   CALCIUM  9.2 8.6* 9.0 8.9  --    Liver Function Tests: Recent Labs  Lab 10/18/23 1442  AST 13*  ALT 20  ALKPHOS 71  BILITOT 0.8  PROT 7.6  ALBUMIN 4.3   CBG: No results for input(s): "GLUCAP" in the last 168 hours.  Discharge time spent: greater than 30 minutes.  Signed: Brenna Cam, MD [facility] 10/22/2023

## 2023-10-25 ENCOUNTER — Telehealth: Payer: Self-pay | Admitting: Cardiology

## 2023-10-25 NOTE — Telephone Encounter (Signed)
 Called to confirm/remind patient of their appointment at the [facility] on 10/26/23.   Appointment:   [] Confirmed  [x] Left mess   [] No answer/No voice mail  [] VM Full/unable to leave message  [] Phone not in service  Patient reminded to bring all medications and/or complete list.  Confirmed patient has transportation. Gave directions, instructed to utilize valet parking.

## 2023-10-26 ENCOUNTER — Encounter: Payer: Self-pay | Admitting: Cardiology

## 2023-10-26 ENCOUNTER — Ambulatory Visit (HOSPITAL_BASED_OUTPATIENT_CLINIC_OR_DEPARTMENT_OTHER): Payer: Self-pay | Admitting: Cardiology

## 2023-10-26 ENCOUNTER — Other Ambulatory Visit
Admission: RE | Admit: 2023-10-26 | Discharge: 2023-10-26 | Disposition: A | Discharge status: Home / Self Care | Payer: Self-pay | Source: Ambulatory Visit | Attending: Cardiology | Admitting: Cardiology

## 2023-10-26 ENCOUNTER — Ambulatory Visit (HOSPITAL_COMMUNITY): Payer: Self-pay | Admitting: Cardiology

## 2023-10-26 VITALS — BP 111/63 | HR 80 | Wt 237.2 lb

## 2023-10-26 DIAGNOSIS — I5022 Chronic systolic (congestive) heart failure: Secondary | ICD-10-CM

## 2023-10-26 DIAGNOSIS — K591 Functional diarrhea: Secondary | ICD-10-CM

## 2023-10-26 DIAGNOSIS — R195 Other fecal abnormalities: Secondary | ICD-10-CM

## 2023-10-26 LAB — BASIC METABOLIC PANEL WITH GFR
Anion gap: 9 (ref 5–15)
BUN: 29 mg/dL — ABNORMAL HIGH (ref 6–20)
CO2: 19 mmol/L — ABNORMAL LOW (ref 22–32)
Calcium: 9.6 mg/dL (ref 8.9–10.3)
Chloride: 109 mmol/L (ref 98–111)
Creatinine, Ser: 1.78 mg/dL — ABNORMAL HIGH (ref 0.61–1.24)
GFR, Estimated: 45 mL/min — ABNORMAL LOW (ref >60)
Glucose, Bld: 161 mg/dL — ABNORMAL HIGH (ref 70–99)
Potassium: 4.3 mmol/L (ref 3.5–5.1)
Sodium: 137 mmol/L (ref 135–145)

## 2023-10-26 NOTE — Patient Instructions (Signed)
 Medication Changes:  STOP Entresto   Lab Work:  Go DOWN to LOWER LEVEL (LL) to have your blood work completed inside of [employer] office.  We will only call you if the results are abnormal or if the provider would like to make medication changes.   Follow-Up in: Please follow up with the [facility] in 4 weeks with Shawnee Dellen, [name].  At the [facility], you and your health needs are our priority. We have a designated team specialized in the treatment of Heart Failure. This Care Team includes your primary Heart Failure Specialized Cardiologist (physician), Advanced Practice Providers (APPs- Physician Assistants and Nurse Practitioners), and Pharmacist who all work together to provide you with the care you need, when you need it.   You may see any of the following providers on your designated Care Team at your next follow up:  Dr. Jules Oar Dr. Peder Bourdon Dr. Alwin Baars Dr. Judyth Nunnery Shawnee Dellen, [name] Bevely Brush, RPH-CPP  Please be sure to bring in all your medications bottles to every appointment.   Need to Contact Us :  If you have any questions or concerns before your next appointment please send us  a message through [place] or call our office at [phone].    TO LEAVE A MESSAGE FOR THE NURSE SELECT OPTION 2, PLEASE LEAVE A MESSAGE INCLUDING: YOUR NAME DATE OF BIRTH CALL BACK NUMBER REASON FOR CALL**this is important as we prioritize the call backs  YOU WILL RECEIVE A CALL BACK THE SAME DAY AS LONG AS YOU CALL BEFORE 4:00 PM

## 2023-10-26 NOTE — Progress Notes (Signed)
 ADVANCED HEART FAILURE FOLLOW UP CLINIC NOTE  Referring Physician: Estil Heman, NP  Primary Care: Ngetich, Elijio Guadeloupe, NP Primary Cardiologist:  HPI: Clarence [name] is a 53 y.o. male with a PMH of chronic systolic HF, Crohn's disease, CKD, HTN who presents for follow up of chronic heart failure with mildly reduced EF.       Patient was admitted in 3/23 with newly found cardiomyopathy, CHF and atypical PNA.  Echo in 3/23 showed EF 30-35%, moderate LVH, normal RV. LHC showed occluded RCA with collaterals, 60% mLAD, 65% mLCx, 90% mid OM1.  RHC showed elevated filling pressures and low CI (2).  Patient was admitted again in 4/23 with CHF. He was admitted in 5/23 with hypertensive urgency and chest pain, CTA abdomen/pelvis did not show renal artery stenosis. He was admitted again in 6/23 with hypertensive urgency.  He quit smoking after 3/23 admission.  Rarely drinks ETOH and no drugs.  No strong family history of CAD or cardiomyopathy.     Sleep study was done, not suggestive of OSA.     Patient continued to have chest pain episodes so LHC/RHC was done in 5/24 showing occluded mRCA with collaterals, 90% stenosis with severe diffuse disease in OM1. 50% mLAD, 60% proximal and mid LCx (unchanged from prior, no interventional target); normal filling pressures and preserved cardiac output.    Patient had progressive angina with repeat cath in 10/24.  This showed similar severe diffuse disease in the OM1, he had DES to OM1.  Other disease unchanged, CTO of RCA, 50% mid LAD.      SUBJECTIVE:  Recently admitted with diarrhea, high ostomy output. Had significant AKI on CKD, improved with fluids. Complaining of the same today, never did get in to see GI. He does admit to feeling lightheaded and dizzy and has had trouble keeping up with ostomy output. Denies any recent or active chest pain. He is feeling somewhat better than he did prior to his ED visit.   PMH, current medications, allergies,  social history, and family history reviewed in epic.  PHYSICAL EXAM: Vitals:   10/26/23 1346 10/26/23 1352  BP: (!) 82/51 111/63  Pulse: 80   SpO2: 97%    GENERAL: Well nourished and in no apparent distress at rest.  PULM:  Normal work of breathing, clear to auscultation bilaterally. Respirations are unlabored.  CARDIAC:  JVP: flat         Normal rate with regular rhythm. No murmurs, rubs or gallops.  No edema. Warm and well perfused extremities. ABDOMEN: soft, mildly tender, ostomy in place. NEUROLOGIC: Patient is oriented x3 with no focal or lateralizing neurologic deficits.    DATA REVIEW   ECHO:  - Echo (3/23): EF 30-35%, moderate LVH, normal RV. - RHC (3/23): mean RA 16, PA 67/30 mean 42, mean PCWP 25, CI 2, PVR 4 - Echo (5/23): EF 35-40%, global HK, moderate LVH, RV normal, mild MR - Echo (4/24): EF 45-50%, inferior and inferolateral hypokinesis, normal RV.    CATH: LHC (10/24): Occluded mRCA with collaterals, 90% long/diffuse stenosis in OM1, 50% mLAD. He was treated with DES to OM1.    ASSESSMENT & PLAN:  Chronic heart failure with mid range EF: Well compensated from a  HF standpoint, unfortunately majority of symptoms relate to known Crohn's. Prior colectomy plus ostomy, awaiting GI follow up. Given low BP (though improved on recheck, recent admission for hypovolemia, and ongoing diarrhea will hold entresto  today. - Stop entresto  24/26mg  BID - Follow  up with APP in 1 month, if improved can restart - BMP today - Continue jardiance  10mg  daily, coreg  3.125mg  BID< - Holding spironolactone  given AKI - Continue lasix  20mg  prn, eu-hypovolemic today  CAD: No anginal symptoms today, has been a chronic complaint. - Continue imdur  60mg  daily, ASA 81mg  daily and plavix  75mg  daily - Continue atorvastatin , praluent , and zetia  as well as fenofibrate   CKD Stage IIIB: Recent admission with creatinine peak of 3.6, improved with fluids. - Stop entresto  as above given ongoing volume  loss - Repeat labs today - Continue jardiance  10mg  daily  Crohn's disease:  - Ongoing diarrhea, GI follow up  Follow up in 1 month with APP then Ralston Burkes, MD Advanced Heart Failure Mechanical Circulatory Support 10/31/23

## 2023-10-27 ENCOUNTER — Encounter: Payer: Self-pay | Admitting: Family

## 2023-10-27 ENCOUNTER — Ambulatory Visit (INDEPENDENT_AMBULATORY_CARE_PROVIDER_SITE_OTHER): Admitting: Family

## 2023-10-27 VITALS — BP 118/74 | HR 72 | Temp 98.1°F | Ht 70.0 in | Wt 238.6 lb

## 2023-10-27 DIAGNOSIS — N1831 Chronic kidney disease, stage 3a: Secondary | ICD-10-CM | POA: Diagnosis not present

## 2023-10-27 DIAGNOSIS — I5022 Chronic systolic (congestive) heart failure: Secondary | ICD-10-CM | POA: Diagnosis not present

## 2023-10-27 DIAGNOSIS — I25118 Atherosclerotic heart disease of native coronary artery with other forms of angina pectoris: Secondary | ICD-10-CM

## 2023-10-27 DIAGNOSIS — I10 Essential (primary) hypertension: Secondary | ICD-10-CM | POA: Diagnosis not present

## 2023-10-27 DIAGNOSIS — E782 Mixed hyperlipidemia: Secondary | ICD-10-CM | POA: Diagnosis not present

## 2023-10-27 DIAGNOSIS — K5000 Crohn's disease of small intestine without complications: Secondary | ICD-10-CM

## 2023-10-27 NOTE — Progress Notes (Signed)
 Provider: Christean Courts [name]-C  Lathyn Griggs, Elijio Guadeloupe, NP  Patient Care Team: Jailey Booton, Elijio Guadeloupe, NP as PCP - General (Family Medicine) End, Veryl Gottron, MD as PCP - Cardiology (Cardiology) Darlis Eisenmenger, MD as PCP - Advanced Heart Failure (Cardiology)  Extended Emergency Contact Information Primary Emergency Contact: [facility] Phone: [phone] Relation: Uncle  Code Status:  Full Code  Goals of care: Advanced Directive information    10/27/2023   11:18 AM  Advanced Directives  Does Patient Have a Medical Advance Directive? No  Would patient like information on creating a medical advance directive? No - Patient declined     Chief Complaint  Patient presents with   Hospitalization Follow-up    Discussed the use of AI scribe software for clinical note transcription with the patient, who gave verbal consent to proceed.  History of Present Illness   Clarence [name] is a 53 year old male with chronic kidney disease, congestive heart failure, and Crohn's disease who presents with severe diarrhea and dehydration.  He has been experiencing severe diarrhea, described as 'real bad, real watery,' leading to dehydration and affecting his kidneys. He was hospitalized from June 2 to June 4 due to these symptoms, which included nausea, vomiting, and increased output from his ostomy bag. Tests, including C. diff, were negative. He discontinued spironolactone  and Entresto  due to these symptoms.  He has a history of Crohn's disease and underwent a colectomy, resulting in an ostomy. While taking the medications, he had to empty his colostomy bag frequently, up to three times an hour, and experienced significant fluid loss through stool. He also reports drinking a lot but urinating very little, indicating fluid loss through the ostomy.  He has chronic kidney disease, stage 3B, which worsened with dehydration. His BUN and creatinine levels were elevated during hospitalization and  subsequently decreased. His potassium levels were high, reaching 5.8-5.9, but have since normalized to 4.3 after treatment.  He has congestive heart failure with reduced ejection fraction (45-50%) and experiences chest pain requiring nitroglycerin  and morphine . He experiences shortness of breath 'all the time' and notes indentations on his legs that do not resolve quickly. His blood pressure was low upon hospital admission and later increased.  He is on multiple medications including Jantanis, Protonix , Plavix , Lipitor , Imdur , furosemide , fenofibrate , and carvedilol . He also takes antacids as needed. He recently started taking Centrum and noticed dark urine, which he attributes to flushing.  He mentions financial difficulties affecting his insurance coverage and medication affordability. He is currently without insurance until September and is seeking financial assistance for medical expenses at [facility] facility.   Past Medical History:  Diagnosis Date   Ascending Aortic Dilation    a. 09/2021 Echo: Asc Ao 40mm.   CAD (coronary artery disease)    a. 07/2021 Cath: LM nl, LAD 75m, D2 40, LCX 65p/m, OM1 90, RCA 115m w/ L-.R collats to RPDA from septal 1/2-->Med rx.   Chronic HFrEF (heart failure with reduced ejection fraction) (HCC)    a. 07/2021 Echo: EF 30-35%, glob HK, mod LVH, GrII DD, nl RV fxn, mildly dil LA, mild-mod MR; b. 09/2021 Echo: EF 35-40%, glob HK, mod LVH, GrI DD, nl RV fxn, mildly dil RA, mild MR, Asc Ao 40mm.   CKD (chronic kidney disease) stage 2, GFR 60-89 ml/min    Crohn's disease (HCC)    Hypertension    Mixed Ischemic & Nonischemic cardiomyopathy    a. 07/2021 Echo: EF 30-35%; b. 07/2021 Cath: Occluded RCA w/  mod LAD/LCX dzs, and severe OM1 dzs-->Med Rx; c. 09/2021 Echo: EF 35-40%.   PAH (pulmonary artery hypertension) (HCC)    a. 07/2021 RHC: PA 67/30 (42).   Proteinuria    Past Surgical History:  Procedure Laterality Date   COLECTOMY     CORONARY STENT INTERVENTION N/A  03/02/2023   Procedure: CORONARY STENT INTERVENTION;  Surgeon: Sammy Crisp, MD;  Location: [facility] INVASIVE CV LAB;  Service: Cardiovascular;  Laterality: N/A;   LEFT HEART CATH AND CORONARY ANGIOGRAPHY Left 03/02/2023   Procedure: LEFT HEART CATH AND CORONARY ANGIOGRAPHY;  Surgeon: Sammy Crisp, MD;  Location: [facility] INVASIVE CV LAB;  Service: Cardiovascular;  Laterality: Left;   RIGHT/LEFT HEART CATH AND CORONARY ANGIOGRAPHY N/A 08/07/2021   Procedure: RIGHT/LEFT HEART CATH AND CORONARY ANGIOGRAPHY;  Surgeon: Sammy Crisp, MD;  Location: [facility] INVASIVE CV LAB;  Service: Cardiovascular;  Laterality: N/A;   RIGHT/LEFT HEART CATH AND CORONARY ANGIOGRAPHY N/A 09/29/2022   Procedure: RIGHT/LEFT HEART CATH AND CORONARY ANGIOGRAPHY;  Surgeon: Darlis Eisenmenger, MD;  [facility] INV[facility]IVE CV LAB;  Service: Cardiovascular;  Laterality: N/A;    Allergies  Allergen Reactions   Ceftriaxone  Rash    Macular rash with itchness   Ibuprofen Other (See Comments)    History of GIB with ibuprofen    Sulfacetamide Other (See Comments)   Neomycin-Bacitracin Zn-Polymyx     Other reaction(s): Other (See Comments)   Spironolactone      Hyperkalemia    Outpatient Encounter Medications as of 10/27/2023  Medication Sig   aluminum -magnesium  hydroxide-simethicone  (MAALOX) 200-200-20 MG/5ML SUSP Take 30 mLs by mouth 4 (four) times daily -  before meals and at bedtime.   atorvastatin  (LIPITOR ) 80 MG tablet Take 1 tablet (80 mg total) by mouth every evening.   carvedilol  (COREG ) 3.125 MG tablet Take 1 tablet (3.125 mg total) by mouth 2 (two) times daily.   clopidogrel  (PLAVIX ) 75 MG tablet Take 1 tablet (75 mg total) by mouth daily.   empagliflozin  (JARDIANCE ) 10 MG TABS tablet Take 10 mg by mouth daily.   fenofibrate  (TRICOR ) 48 MG tablet Take 1 tablet (48 mg total) by mouth daily.   furosemide  (LASIX ) 20 MG tablet Take 1 tablet (20 mg total) by mouth daily as needed. For weight gain of 3 lb in 24 hours or 5 lbs  in one week   isosorbide  mononitrate (IMDUR ) 60 MG 24 hr tablet Take 1 tablet (60 mg total) by mouth daily.   pantoprazole  (PROTONIX ) 40 MG tablet Take 1 tablet (40 mg total) by mouth daily.   [DISCONTINUED] dapagliflozin  propanediol (FARXIGA ) 10 MG TABS tablet Take 1 tablet (10 mg total) by mouth daily.   [DISCONTINUED] sacubitril -valsartan  (ENTRESTO ) 24-26 MG Take 1 tablet by mouth 2 (two) times daily.   No facility-administered encounter medications on file as of 10/27/2023.    Review of Systems  Constitutional:  Negative for appetite change, chills, fatigue, fever and unexpected weight change.  HENT:  Negative for congestion, dental problem, ear discharge, ear pain, facial swelling, hearing loss, nosebleeds, postnasal drip, rhinorrhea, sinus pressure, sinus pain, sneezing, sore throat, tinnitus and trouble swallowing.   Eyes:  Negative for pain, discharge, redness, itching and visual disturbance.  Respiratory:  Negative for cough, chest tightness and wheezing.        Chronic dyspnea with exertion   Cardiovascular:  Negative for chest pain, palpitations and leg swelling.  Gastrointestinal:  Negative for abdominal distention, abdominal pain, blood in stool, constipation, diarrhea, nausea and vomiting.  Diarrhea has improved   Endocrine: Negative for cold intolerance, heat intolerance, polydipsia, polyphagia and polyuria.  Genitourinary:  Negative for difficulty urinating, dysuria, flank pain, frequency and urgency.  Musculoskeletal:  Negative for arthralgias, back pain, gait problem, joint swelling, myalgias, neck pain and neck stiffness.  Skin:  Negative for color change, pallor, rash and wound.  Neurological:  Negative for dizziness, syncope, speech difficulty, weakness, light-headedness, numbness and headaches.  Hematological:  Does not bruise/bleed easily.  Psychiatric/Behavioral:  Negative for agitation, behavioral problems, confusion, hallucinations, self-injury, sleep disturbance  and suicidal ideas. The patient is not nervous/anxious.     Immunization History  Administered Date(s) Administered   Moderna SARS-COV2 Booster Vaccination 04/21/2020, 01/07/2021   Tdap 05/18/2013   Zoster Recombinant(Shingrix ) 01/07/2021, 07/29/2022   Pertinent  Health Maintenance Due  Topic Date Due   INFLUENZA VACCINE  12/17/2023      01/11/2022   12:56 AM 07/15/2022    8:39 AM 07/28/2022    2:14 PM 09/18/2022    2:51 PM 01/05/2023    1:56 PM  Fall Risk  Falls in the past year?  0 0 0 0  Was there an injury with Fall?  0 0 0 0  Fall Risk Category Calculator  0 0 0 0  (RETIRED) Patient Fall Risk Level Low fall risk      Patient at Risk for Falls Due to  No Fall Risks  No Fall Risks No Fall Risks  Fall risk Follow up  Falls evaluation completed  Falls evaluation completed Falls evaluation completed   Functional Status Survey:    Vitals:   10/27/23 1121  BP: 118/74  Pulse: 72  Temp: 98.1 F (36.7 C)  SpO2: 96%  Weight: 238 lb 9.6 oz (108.2 kg)  Height: 5' 10 (1.778 m)   Body mass index is 34.24 kg/m. Physical Exam VITALS: BP- 118/74 MEASUREMENTS: Weight- 238.6. GENERAL: Alert, cooperative, well developed, no acute distress. HEENT: Normocephalic, normal oropharynx, moist mucous membranes. CHEST: Clear to auscultation bilaterally, no wheezes, rhonchi, or crackles. CARDIOVASCULAR: Normal heart rate and rhythm, S1 and S2 normal without murmurs. ABDOMEN: Soft, non-tender, non-distended, without organomegaly, normal bowel sounds. Stool mostly solid with some liquid on right lower abdominal ostomy  EXTREMITIES: No cyanosis or edema. NEUROLOGICAL: Cranial nerves grossly intact, moves all extremities without gross motor or sensory deficit.  SKIN: No rash,no lesion or erythema   PSYCHIATRY/BEHAVIORAL: Mood stable   Labs reviewed: Recent Labs    06/05/23 0427 06/05/23 0428 06/15/23 0615 06/17/23 0442 10/20/23 0811 10/20/23 1242 10/20/23 1429 10/26/23 1441  NA  --     < > 137   < > 136 138  --  137  K  --    < > 4.7   < > 5.2* 5.3* 4.6 4.3  CL  --    < > 112*   < > 113* 114*  --  109  CO2  --    < > 19*   < > 19* 18*  --  19*  GLUCOSE  --    < > 127*   < > 100* 92  --  161*  BUN  --    < > 54*   < > 53* 56*  --  29*  CREATININE  --    < > 1.83*   < > 1.84* 1.92*  --  1.78*  CALCIUM   --    < > 8.6*   < > 9.0 8.9  --  9.6  MG 2.2  --  2.6*  --   --   --   --   --   PHOS  --   --  2.1*  --   --   --   --   --    < > = values in this interval not displayed.   Recent Labs    06/15/23 0615 06/17/23 0442 10/18/23 1442  AST 15 42* 13*  ALT 17 43 20  ALKPHOS 43 65 71  BILITOT 0.6 0.7 0.8  PROT 6.4* 7.8 7.6  ALBUMIN 3.2* 4.2 4.3   Recent Labs    06/15/23 0615 06/17/23 0442 10/18/23 1437 10/19/23 0512  WBC 7.9 12.1* 9.9 8.1  NEUTROABS 4.9 8.2*  --  5.0  HGB 13.2 14.4 16.6 15.0  HCT 38.3* 41.7 49.4 43.8  MCV 79.0* 80.5 82.1 80.2  PLT 255 379 326 242   Lab Results  Component Value Date   TSH 0.84 03/09/2023   Lab Results  Component Value Date   HGBA1C 6.6 (H) 10/18/2023   Lab Results  Component Value Date   CHOL 71 05/26/2023   HDL 32 (L) 05/26/2023   LDLCALC 15 05/26/2023   LDLDIRECT 69.0 09/09/2021   TRIG 120 05/26/2023   CHOLHDL 2.2 05/26/2023    Significant Diagnostic Results in last 30 days:  CT ABDOMEN PELVIS WO CONTRAST Result Date: 10/18/2023 CLINICAL DATA:  Nausea with vomiting and worsening watery output from the osteotomy. Evaluate for acute intra-infection. EXAM: CT ABDOMEN AND PELVIS WITHOUT CONTRAST TECHNIQUE: Multidetector CT imaging of the abdomen and pelvis was performed following the standard protocol without IV contrast. RADIATION DOSE REDUCTION: This exam was performed according to the departmental dose-optimization program which includes automated exposure control, adjustment of the mA and/or kV according to patient size and/or use of iterative reconstruction technique. COMPARISON:  Abdominopelvic CT 06/12/2023 and  06/06/2023. FINDINGS: Lower chest: The visualized lung bases are now clear with resolution of the previously demonstrated patchy right lower lobe airspace disease. No significant pleural or pericardial effusion. Hepatobiliary: The liver has a stable, non cirrhotic morphology. No focal hepatic lesions are identified on noncontrast imaging. Incomplete gallbladder distention. No evidence of gallstones, gallbladder wall thickening or biliary dilatation. Pancreas: Unremarkable. No pancreatic ductal dilatation or surrounding inflammatory changes. Spleen: Normal in size without focal abnormality. Adrenals/Urinary Tract: Both adrenal glands appear normal. No evidence of urinary tract calculus, hydronephrosis or perinephric soft tissue stranding. Previously demonstrated small cystic lesions in the lower pole of the right kidney are unchanged; no specific follow-up imaging recommended. The bladder appears unremarkable for its degree of distention. Stomach/Bowel: No enteric contrast administered. 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 No evidence of incarceration or obstruction. Stable submucosal fat deposition within the [employer]. Mildly prominent ingested material within the stomach. Vascular/Lymphatic: There are no enlarged abdominal or pelvic lymph nodes. Minimal aortoiliac atherosclerosis. Reproductive: The prostate gland and seminal vesicles appear unremarkable. 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 No evidence of ascites or pneumoperitoneum. Musculoskeletal: No acute or significant osseous findings. Lower lumbar spondylosis. IMPRESSION: 1. No acute findings or explanation for the patient's symptoms. 2. Stable postoperative changes status post colectomy with right lower quadrant end ileostomy. 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 3. Resolution of previously demonstrated right lower lobe airspace disease. 4.  Aortic Atherosclerosis (ICD10-I70.0). Electronically Signed  By: Elmon Hagedorn M.D.   On: 10/18/2023 17:02   CT HEAD WO CONTRAST ( ) Result Date: 10/18/2023 CLINICAL DATA:  Dizziness. EXAM: CT HEAD WITHOUT CONTRAST TECHNIQUE: Contiguous axial images were obtained from the base of the skull through the vertex without intravenous contrast. RADIATION DOSE REDUCTION: This exam was performed according to the departmental dose-optimization program which includes automated exposure control, adjustment of the mA and/or kV according to patient size and/or use of iterative reconstruction technique. COMPARISON:  Head CT 09/14/2021 FINDINGS: Brain: No intracranial hemorrhage, mass effect, or midline shift. No hydrocephalus. The basilar cisterns are patent. No evidence of territorial infarct or acute ischemia. No extra-axial or intracranial fluid collection. Vascular: Atherosclerosis of skullbase vasculature without hyperdense vessel or abnormal calcification. Skull: No fracture or focal lesion. Sinuses/Orbits: Paranasal sinuses and mastoid air cells are clear. The visualized orbits are unremarkable. Other: None. IMPRESSION: No acute intracranial abnormality. Electronically Signed   By: Chadwick Colonel M.D.   On: 10/18/2023 16:51   DG Chest Port 1 View Result Date: 10/18/2023 CLINICAL DATA:  Dizziness EXAM: PORTABLE CHEST 1 VIEW COMPARISON:  January 26 25 FINDINGS: The heart size and mediastinal contours are within normal limits. Both lungs are clear. The visualized skeletal structures are unremarkable. IMPRESSION: No active disease. Electronically Signed   By: Fredrich Jefferson M.D.   On: 10/18/2023 15:55    Assessment/Plan  Diarrhea and Dehydration Experienced severe diarrhea and dehydration, leading to hospitalization from June 2 to  June 4. Diarrhea was watery and frequent, requiring frequent emptying of the ostomy bag. Initial suspicion of a viral infection was ruled out as C. diff tests were negative. Diarrhea and dehydration were attributed to side effects from long-term use of spironolactone  and Entresto , which were discontinued. Dehydration affected kidney function, causing acute kidney injury with elevated creatinine and BUN levels. - Monitor hydration status and kidney function - Encourage increased fluid intake, primarily water - Avoid alcohol to prevent increased blood pressure and blood sugar  Chronic Kidney Disease Stage 3B Chronic kidney disease stage 3B exacerbated by dehydration. During hospitalization, creatinine levels were elevated, indicating acute kidney injury, but have since improved. Potassium levels were also elevated but have normalized after treatment. Recent blood work shows creatinine at 1.78 and potassium at 4.3, indicating improvement. - Monitor kidney function with regular blood tests - Ensure adequate hydration to prevent further kidney damage  Congestive Heart Failure with Reduced Ejection Fraction Has congestive heart failure with a reduced ejection fraction of 45-50% due to grade one diastolic dysfunction. Condition managed with medications, but experienced chest pain requiring nitroglycerin  and morphine  during hospitalization. Blood pressure was low upon hospital arrival but improved with hydration. - Continue current heart failure medications including Jantanis, Imdur , and furosemide  as needed - Monitor for symptoms of heart failure such as dyspnea and edema - Encourage regular monitoring of weight to detect fluid retention  Coronary Artery Disease Coronary artery disease with chest pain during hospitalization. Troponin levels were slightly elevated, likely due to low blood pressure on arrival. On medications including Plavix  and Lipitor  for management. - Continue current medications  including Plavix  and Lipitor  - Monitor for recurrent chest pain and seek medical attention if it occurs  Hypertension Hypertension well-controlled during the visit with a blood pressure reading of 118/74 mmHg. Experienced low blood pressure during hospitalization due to dehydration. - Continue current antihypertensive medications including Covedilol - Monitor blood pressure regularly  Type 2 Diabetes Mellitus A1c was 6.6% at the hospital, indicating suboptimal control of diabetes. A1c  has increased from 6.3% four months ago. Encouraged to increase physical activity and dietary changes to manage blood sugar levels. - Encourage lifestyle modifications including increased physical activity and dietary changes to manage blood sugar levels - Monitor blood glucose levels regularly   Family/ staff Communication: Reviewed plan of care with patient verbalized understanding   Labs/tests ordered: Had labs done by Cardiologist yesterday.   Next Appointment: Return in about 6 months (around 04/27/2024) for medical mangement of chronic issues..   Total time: minutes. Greater than 50% of total time spent doing patient education regarding CAD,CHF,CKD stage 3b, HTN, HLD,crohn's disease,health maintenance including symptom/medication management.   Estil Heman, NP

## 2023-11-02 NOTE — Telephone Encounter (Signed)
 Patient needs to follow up with GI, if he doesn't have an upcoming appointment can we place an urgent referral? Otherwise would recommend staying hydrated and needs labs when he sees Brian Campanile in a few weeks.

## 2023-11-02 NOTE — Progress Notes (Signed)
 Referral placed.

## 2023-11-03 NOTE — Addendum Note (Signed)
 Addended by: Louisa Favaro A on: 11/03/2023 09:24 AM   Modules accepted: Orders

## 2023-11-03 NOTE — Progress Notes (Signed)
 Urgent Referral placed to [facility].  Practice administrator, Ginger Feldpausch  reached out stating they unfortunately wouldn't be able to see the patient because they do not currently have any providers and it would best for the patient to be referred to [facility] or [facility] instead.  Referral sent to [facility].

## 2023-11-03 NOTE — Addendum Note (Signed)
 Addended by: Calisa Luckenbaugh A on: 11/03/2023 10:34 AM   Modules accepted: Orders

## 2023-11-08 ENCOUNTER — Telehealth: Payer: Self-pay

## 2023-11-08 ENCOUNTER — Other Ambulatory Visit (INDEPENDENT_AMBULATORY_CARE_PROVIDER_SITE_OTHER)

## 2023-11-08 ENCOUNTER — Ambulatory Visit (INDEPENDENT_AMBULATORY_CARE_PROVIDER_SITE_OTHER): Payer: Self-pay | Admitting: Physician Assistant

## 2023-11-08 ENCOUNTER — Encounter: Payer: Self-pay | Admitting: Physician Assistant

## 2023-11-08 VITALS — BP 140/100 | HR 92 | Ht 70.0 in | Wt 241.0 lb

## 2023-11-08 DIAGNOSIS — K50918 Crohn's disease, unspecified, with other complication: Secondary | ICD-10-CM

## 2023-11-08 DIAGNOSIS — R110 Nausea: Secondary | ICD-10-CM

## 2023-11-08 DIAGNOSIS — R197 Diarrhea, unspecified: Secondary | ICD-10-CM

## 2023-11-08 DIAGNOSIS — Z9889 Other specified postprocedural states: Secondary | ICD-10-CM

## 2023-11-08 DIAGNOSIS — I509 Heart failure, unspecified: Secondary | ICD-10-CM

## 2023-11-08 DIAGNOSIS — Z87891 Personal history of nicotine dependence: Secondary | ICD-10-CM

## 2023-11-08 DIAGNOSIS — Z9861 Coronary angioplasty status: Secondary | ICD-10-CM

## 2023-11-08 DIAGNOSIS — I5020 Unspecified systolic (congestive) heart failure: Secondary | ICD-10-CM

## 2023-11-08 DIAGNOSIS — I2510 Atherosclerotic heart disease of native coronary artery without angina pectoris: Secondary | ICD-10-CM

## 2023-11-08 LAB — CBC WITH DIFFERENTIAL/PLATELET
Basophils Absolute: 0.1 10*3/uL (ref 0.0–0.1)
Basophils Relative: 0.9 % (ref 0.0–3.0)
Eosinophils Absolute: 0.2 10*3/uL (ref 0.0–0.7)
Eosinophils Relative: 2.2 % (ref 0.0–5.0)
HCT: 41 % (ref 39.0–52.0)
Hemoglobin: 13.9 g/dL (ref 13.0–17.0)
Lymphocytes Relative: 20.8 % (ref 12.0–46.0)
Lymphs Abs: 1.6 10*3/uL (ref 0.7–4.0)
MCHC: 33.9 g/dL (ref 30.0–36.0)
MCV: 80.1 fl (ref 78.0–100.0)
Monocytes Absolute: 0.7 10*3/uL (ref 0.1–1.0)
Monocytes Relative: 9.9 % (ref 3.0–12.0)
Neutro Abs: 4.9 10*3/uL (ref 1.4–7.7)
Neutrophils Relative %: 66.2 % (ref 43.0–77.0)
Platelets: 213 10*3/uL (ref 150.0–400.0)
RBC: 5.11 Mil/uL (ref 4.22–5.81)
RDW: 15.7 % — ABNORMAL HIGH (ref 11.5–15.5)
WBC: 7.5 10*3/uL (ref 4.0–10.5)

## 2023-11-08 LAB — COMPREHENSIVE METABOLIC PANEL WITH GFR
ALT: 33 U/L (ref 0–53)
AST: 19 U/L (ref 0–37)
Albumin: 4.1 g/dL (ref 3.5–5.2)
Alkaline Phosphatase: 101 U/L (ref 39–117)
BUN: 14 mg/dL (ref 6–23)
CO2: 23 meq/L (ref 19–32)
Calcium: 9.4 mg/dL (ref 8.4–10.5)
Chloride: 109 meq/L (ref 96–112)
Creatinine, Ser: 1.72 mg/dL — ABNORMAL HIGH (ref 0.40–1.50)
GFR: 44.42 mL/min — ABNORMAL LOW (ref >60.00)
Glucose, Bld: 116 mg/dL — ABNORMAL HIGH (ref 70–99)
Potassium: 3.7 meq/L (ref 3.5–5.1)
Sodium: 141 meq/L (ref 135–145)
Total Bilirubin: 0.5 mg/dL (ref 0.2–1.2)
Total Protein: 6.9 g/dL (ref 6.0–8.3)

## 2023-11-08 LAB — SEDIMENTATION RATE: Sed Rate: 27 mm/h — ABNORMAL HIGH (ref 0–20)

## 2023-11-08 LAB — C-REACTIVE PROTEIN: CRP: <1 mg/dL (ref 0.5–20.0)

## 2023-11-08 LAB — TSH: TSH: 1.19 u[IU]/mL (ref 0.35–5.50)

## 2023-11-08 NOTE — Progress Notes (Signed)
 11/08/2023 Clarence [name] [id number] 03-02-1971  Referring provider: Leonarda Roxan BROCKS, NP Primary GI doctor: Dr. Suzann  ASSESSMENT AND PLAN:   Crohn's disease  status post colectomy with end ileostomy 2008 at [facility] Pathology confirmed crohn's, no dysplasix 2008 Presents with nausea and intermittent high output ostomy, no hematochezia 10/18/2023 CT abdomen pelvis without contrast no acute changes, C. difficile and GI pathogen negative, fecal calprotectin 39 06/17/2023 right upper quadrant abdominal ultrasound unremarkable gallbladder 06/06/2023 CT abdomen pelvis with contrast moderately distended stomach with ingested material, submucosal fat duodenum may reflect chronic changes inflammatory bowel disease no active inflammation - check ESR, CRP, fecal cal,TSH, giardia had negative stool studies 10/2023 - Refer to ostomy clinic for ileostomy management. - Advise Metamucil or psyllium husk for stool regulation with ileostomy. - Advise Imodium  as needed for high output. - get cortisol AM - Hpylori diatherix , celiac - consider upper GI series with small bowel follow through since patient just had DES stent 02/2023 on plavix , suggest EGD/ileostomy 02/2024 after 1 year on plavix  and get cardiac clearance from cardiology- will await labs at this time  CAD 03/02/2023 severe two-vessel coronary artery disease proximal-mid OM1 and chronic total occlusion mid RCA with left-to-right collaterals, moderate mid LAD and left circumflex disease similar to prior catheterizations status post PCI to OM1 with DES On clopidogrel  Ideally will wait a year before scheduling EGD and ileostomy  Chronic heart failure with reduced ejection fraction 07/22/2023 echo EF 45 to 50%, grade 1 diastolic normal pulmonary artery systolic pressure, normal valves  CKD BUN 29 Cr 1.78  GFR 45  Potassium 4.3  Magnesium  2.6   Pulmonary artery hypertension 07/2021 right heart catheterization PA 67/30 Echo without elevated  RSVP 07/2023  Patient Care Team: Ngetich, Roxan BROCKS, NP as PCP - General (Family Medicine) End, Lonni, MD as PCP - Cardiology (Cardiology) Rolan Ezra RAMAN, MD as PCP - Advanced Heart Failure (Cardiology)  HISTORY OF PRESENT ILLNESS: 53 y.o. male presents as a new patient for evaluation of diarrhea in the setting of colonic Crohn's disease status post colectomy with end ileostomy 2009 at [facility]. Patient presents as a new patient, briefly seen inpatient at [facility] by [facility] 08/2022 for diarrhea.  IBD history: Diagnosed 1990 at [facility] Possible perirecal abscess in the beginning, never any fistulas Uncertain what medications he was on prior, was on a lot of clinical trials but uncertain what medications, nothing consistent 2008 [facility] Colectomy with end ileostomy Long term prednisone  use but weaned off  Last colonoscopy: remote Last small bowel imaging:  10/18/2023 CT abdomen pelvis without contrast for nausea and vomiting worse watery output from ostomy showed no acute findings, stable postoperative changes status post colectomy with right lower quadrant end ileostomy unchanged 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  Other significant medical history: CAD with recent catheterization and stenting 02/2023 on Plavix  History of pulmonary hypertension Heart failure with reduced ejection fraction most recent echo ejection fraction 40-45% GERD with nausea/vomiting  Current History Discussed the use of AI scribe software for clinical note transcription with the patient, who gave verbal consent to proceed.  History of Present Illness   Clarence [name] is a 53 year old male with Crohn's disease who presents with gastrointestinal symptoms and ileostomy management issues.  He has a  long-standing history of Crohn's disease, diagnosed in  1990, with complications such as fistulas and rectal abscesses, particularly in the 1990s, requiring surgical intervention. A significant operation was performed in 2008 after participating in clinical trials that temporarily alleviated symptoms. Post-operation, he experienced a rupture necessitating emergency surgery.  He reports high output from his ileostomy, primarily liquid, with occasional tan or dark consistency. Periods of high output are followed by normal consistency, managed with dietary adjustments and medications like Pepto Bismol and Imodium  as needed.  He experiences nausea and vomiting, particularly a few months ago, with episodes of acid reflux severe enough to cause vomiting. A CT abdomen and pelvis in June was performed. He uses Mylanta or Pepto Bismol to manage these symptoms.  He has a history of prednisone  use, which he believes has affected his bones and teeth, but he has been off it for several years. No current rashes, joint pain, or ulcers, and no recent changes in vision.  Occasional dizziness and headaches are present, but no recent fevers or chills. Abdominal pain occurs sometimes, particularly at the site of a hernia, but it is not consistently painful.  He has not had a recent colonoscopy or ileoscopy and is uncertain about the timing of his last procedures. He has not been followed by a GI specialist recently and manages his ileostomy care independently.      Recent labs: 06/24/2023 CRP 0.6 06/24/2023 SED RATE 12 10/19/2023 WBC 8.1 HGB 15.0 MCV 80.2 Platelets 242 B12 439 FOLATE No results found for requested labs within last 1095 days. 10/18/2023 AST 13 ALT 20 Alkphos 71 TBili 0.8  TB GOLD remote HepBsAG remote TPMT Activity: uncertain  Immunization History  Administered Date(s) Administered   Moderna SARS-COV2 Booster Vaccination 04/21/2020, 01/07/2021   Tdap 05/18/2013   Zoster Recombinant(Shingrix )  01/07/2021, 07/29/2022    RELEVANT GI HISTORY, LABS, IMAGING: Path 2009 - Severe active chronic colitis with mucosal ulceration and granulation tissue  formation.  - No evidence of dysplasia or malignancy.  - No evidence of viral cytopathic change.   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  - No evidence of dysplasia or malignancy.  - No evidence of viral cytopathic change.   CBC    Component Value Date/Time   WBC 8.1 10/19/2023 0512   RBC 5.46 10/19/2023 0512   HGB 15.0 10/19/2023 0512   HGB 14.8 03/23/2023 1511   HCT 43.8 10/19/2023 0512   HCT 45.4 03/23/2023 1511   PLT 242 10/19/2023 0512   PLT 269 03/23/2023 1511   MCV 80.2 10/19/2023 0512   MCV 86 03/23/2023 1511   MCH 27.5 10/19/2023 0512   MCHC 34.2 10/19/2023 0512   RDW 14.3 10/19/2023 0512   RDW 13.7 03/23/2023 1511   LYMPHSABS 2.0 10/19/2023 0512   LYMPHSABS 1.6 03/23/2023 1511   MONOABS 1.0 10/19/2023 0512   EOSABS 0.1 10/19/2023 0512   EOSABS 0.2 03/23/2023 1511   BASOSABS 0.0 10/19/2023 0512   BASOSABS 0.0 03/23/2023 1511   Recent Labs    06/04/23 1233 06/05/23 0428 06/06/23 1339 06/12/23 0928 06/13/23 0428 06/14/23 0941 06/15/23 0615 06/17/23 0442 10/18/23 1437 10/19/23 0512  HGB 14.7 13.3 16.2 15.9 14.1 15.0 13.2 14.4 16.6 15.0    CMP     Component Value Date/Time   NA 137 10/26/2023 1441   NA 144 03/23/2023 1511   K 4.3 10/26/2023 1441   CL 109 10/26/2023 1441   CO2 19 (L) 10/26/2023 1441   GLUCOSE 161 (H) 10/26/2023 1441  BUN 29 (H) 10/26/2023 1441   BUN 25 (H) 03/23/2023 1511   CREATININE 1.78 (H) 10/26/2023 1441   CREATININE 2.11 (H) 03/09/2023 1507   CALCIUM  9.6 10/26/2023 1441   PROT 7.6 10/18/2023 1442   ALBUMIN 4.3 10/18/2023 1442   AST 13 (L) 10/18/2023 1442   ALT 20 10/18/2023 1442   ALKPHOS 71 10/18/2023 1442   BILITOT 0.8 10/18/2023 1442   GFRNONAA 45 (L) 10/26/2023 1441   GFRAA  06/11/2010  1440    >60        The eGFR has been calculated using the MDRD equation. This calculation has not been validated in all clinical situations. eGFR's persistently <60 mL/min signify possible Chronic Kidney Disease.      Latest Ref Rng & Units 10/18/2023    2:42 PM 06/17/2023    4:42 AM 06/15/2023    6:15 AM  Hepatic Function  Total Protein 6.5 - 8.1 g/dL 7.6  7.8  6.4   Albumin 3.5 - 5.0 g/dL 4.3  4.2  3.2   AST 15 - 41 U/L 13  42  15   ALT 0 - 44 U/L 20  43  17   Alk Phosphatase 38 - 126 U/L 71  65  43   Total Bilirubin 0.0 - 1.2 mg/dL 0.8  0.7  0.6       Current Medications:   Current Outpatient Medications (Endocrine & Metabolic):    empagliflozin  (JARDIANCE ) 10 MG TABS tablet, Take 10 mg by mouth daily.   dapagliflozin  propanediol (FARXIGA ) 10 MG TABS tablet, Take by mouth daily. (Patient not taking: Reported on 11/08/2023)   dapagliflozin  propanediol (FARXIGA ) 10 MG TABS tablet, Take by mouth daily. (Patient not taking: Reported on 11/08/2023)  Current Outpatient Medications (Cardiovascular):    atorvastatin  (LIPITOR ) 80 MG tablet, Take 1 tablet (80 mg total) by mouth every evening.   carvedilol  (COREG ) 3.125 MG tablet, Take 1 tablet (3.125 mg total) by mouth 2 (two) times daily.   furosemide  (LASIX ) 20 MG tablet, Take 1 tablet (20 mg total) by mouth daily as needed. For weight gain of 3 lb in 24 hours or 5 lbs in one week   isosorbide  mononitrate (IMDUR ) 60 MG 24 hr tablet, Take 1 tablet (60 mg total) by mouth daily.   fenofibrate  (TRICOR ) 48 MG tablet, Take 1 tablet (48 mg total) by mouth daily. (Patient not taking: Reported on 11/08/2023)    Current Outpatient Medications (Hematological):    clopidogrel  (PLAVIX ) 75 MG tablet, Take 1 tablet (75 mg total) by mouth daily.  Current Outpatient Medications (Other):    aluminum -magnesium  hydroxide-simethicone  (MAALOX) 200-200-20 MG/5ML SUSP, Take 30 mLs by mouth 4 (four) times daily -  before meals and at bedtime.    pantoprazole  (PROTONIX ) 40 MG tablet, Take 1 tablet (40 mg total) by mouth daily.  Medical History:  Past Medical History:  Diagnosis Date   Ascending Aortic Dilation    a. 09/2021 Echo: Asc Ao 40mm.   CAD (coronary artery disease)    a. 07/2021 Cath: LM nl, LAD 19m, D2 40, LCX 65p/m, OM1 90, RCA 168m w/ L-.R collats to RPDA from septal 1/2-->Med rx.   Chronic HFrEF (heart failure with reduced ejection fraction) (HCC)    a. 07/2021 Echo: EF 30-35%, glob HK, mod LVH, GrII DD, nl RV fxn, mildly dil LA, mild-mod MR; b. 09/2021 Echo: EF 35-40%, glob HK, mod LVH, GrI DD, nl RV fxn, mildly dil RA, mild MR, Asc Ao 40mm.   CKD (chronic kidney  disease) stage 2, GFR 60-89 ml/min    Crohn's disease (HCC)    Hypertension    Mixed Ischemic & Nonischemic cardiomyopathy    a. 07/2021 Echo: EF 30-35%; b. 07/2021 Cath: Occluded RCA w/ mod LAD/LCX dzs, and severe OM1 dzs-->Med Rx; c. 09/2021 Echo: EF 35-40%.   PAH (pulmonary artery hypertension) (HCC)    a. 07/2021 RHC: PA 67/30 (42).   Proteinuria    Allergies:  Allergies  Allergen Reactions   Ceftriaxone  Rash    Macular rash with itchness   Ibuprofen Other (See Comments)    History of GIB with ibuprofen    Sulfacetamide Other (See Comments)   Neomycin-Bacitracin Zn-Polymyx     Other reaction(s): Other (See Comments)   Spironolactone      Hyperkalemia     Surgical History:  He  has a past surgical history that includes Colectomy; RIGHT/LEFT HEART CATH AND CORONARY ANGIOGRAPHY (N/A, 08/07/2021); RIGHT/LEFT HEART CATH AND CORONARY ANGIOGRAPHY (N/A, 09/29/2022); LEFT HEART CATH AND CORONARY ANGIOGRAPHY (Left, 03/02/2023); and CORONARY STENT INTERVENTION (N/A, 03/02/2023). Family History:  His family history includes Cancer in his maternal aunt; Heart disease in his maternal uncle; Hypertension in his mother; Pancreatic cancer in his maternal uncle.  REVIEW OF SYSTEMS  : All other systems reviewed and negative except where noted in the History of Present  Illness.  PHYSICAL EXAM: BP (!) 140/100   Pulse 92   Ht 5' 10 (1.778 m)   Wt 241 lb (109.3 kg)   BMI 34.58 kg/m  Physical Exam   GENERAL APPEARANCE: Well nourished, in no apparent distress. HEENT: No cervical lymphadenopathy, unremarkable thyroid , sclerae anicteric, conjunctiva pink. RESPIRATORY: Respiratory effort normal, breath sounds equal bilaterally without rales, rhonchi, or wheezing. CARDIO: Regular rate and rhythm with no murmurs, rubs, or gallops, peripheral pulses intact. ABDOMEN: Soft, non-distended, active bowel sounds in all four quadrants, no tenderness to palpation, no rebound, no mass appreciated. Right lower quadrant ileostomy with pink tissue, hernia, and liquid brown stool. No abdominal obstruction. RECTAL: Declines. MUSCULOSKELETAL: Full range of motion, normal gait, without edema. SKIN: Dry, intact without rashes or lesions. No jaundice. NEURO: Alert, oriented, no focal deficits. PSYCH: Cooperative, normal mood and affect.        Alan JONELLE Coombs, PA-C 4:27 PM

## 2023-11-08 NOTE — Addendum Note (Signed)
 Addended by: Cipriano Millikan N on: 11/08/2023 04:58 PM   Modules accepted: Orders

## 2023-11-08 NOTE — Telephone Encounter (Signed)
-----   Message from Alan JONELLE Coombs sent at 11/08/2023  4:26 PM EDT ----- please have patient return for more lab work and H pylori diatherix

## 2023-11-08 NOTE — Patient Instructions (Addendum)
 Your provider has requested that you go to the basement level for lab work before leaving today. Press B on the elevator. The lab is located at the first door on the left as you exit the elevator.  Can add on metamucil as needed Can add on pepcid  at night 40 mg Can take imodium  as needed  - Can try loperamide  4 mg initially, then 2 mg after each unformed stool for =2 days, with a maximum of 16 mg/day.  - If loperamide  is not working, you could try bismuth salicylate (Pepto-Bismol) 30 mL or two tablets every 30 minutes for eight doses. Pepto-Bismol may make your stools black.   Go to the ER if any severe abdominal pain, fever, or weakness   I will get in contact with Dr. Suzann and get back in touch with you for the imaging  You have been referred to the ostomy clinic.   I appreciate the opportunity to care for you. Alan Coombs, PA

## 2023-11-09 ENCOUNTER — Ambulatory Visit: Payer: Self-pay | Admitting: Physician Assistant

## 2023-11-09 ENCOUNTER — Other Ambulatory Visit

## 2023-11-09 DIAGNOSIS — K50918 Crohn's disease, unspecified, with other complication: Secondary | ICD-10-CM

## 2023-11-09 DIAGNOSIS — R197 Diarrhea, unspecified: Secondary | ICD-10-CM | POA: Diagnosis not present

## 2023-11-09 DIAGNOSIS — Z9889 Other specified postprocedural states: Secondary | ICD-10-CM | POA: Diagnosis not present

## 2023-11-09 DIAGNOSIS — R110 Nausea: Secondary | ICD-10-CM

## 2023-11-09 NOTE — Telephone Encounter (Signed)
 Lm on vm for patient to return call.   Diatherix kit and instructions placed at the 2nd floor front desk for patient to pick up

## 2023-11-10 ENCOUNTER — Ambulatory Visit: Payer: Self-pay | Admitting: Physician Assistant

## 2023-11-10 LAB — GIARDIA AND CRYPTOSPORIDIUM ANTIGEN PANEL
MICRO NUMBER:: 16618067
RESULT:: NOT DETECTED
SPECIMEN QUALITY:: ADEQUATE
Specimen Quality:: ADEQUATE
micro Number:: 16618066

## 2023-11-11 ENCOUNTER — Emergency Department: Payer: Self-pay

## 2023-11-11 ENCOUNTER — Encounter: Payer: Self-pay | Admitting: Internal Medicine

## 2023-11-11 ENCOUNTER — Observation Stay
Admission: EM | Admit: 2023-11-11 | Discharge: 2023-11-12 | Disposition: A | Discharge status: Home / Self Care | Payer: Self-pay | Attending: Internal Medicine | Admitting: Internal Medicine

## 2023-11-11 ENCOUNTER — Other Ambulatory Visit: Payer: Self-pay

## 2023-11-11 DIAGNOSIS — I161 Hypertensive emergency: Secondary | ICD-10-CM

## 2023-11-11 DIAGNOSIS — R0789 Other chest pain: Principal | ICD-10-CM | POA: Insufficient documentation

## 2023-11-11 DIAGNOSIS — I160 Hypertensive urgency: Secondary | ICD-10-CM | POA: Insufficient documentation

## 2023-11-11 DIAGNOSIS — I2510 Atherosclerotic heart disease of native coronary artery without angina pectoris: Secondary | ICD-10-CM | POA: Insufficient documentation

## 2023-11-11 DIAGNOSIS — I5022 Chronic systolic (congestive) heart failure: Secondary | ICD-10-CM | POA: Insufficient documentation

## 2023-11-11 DIAGNOSIS — Z7984 Long term (current) use of oral hypoglycemic drugs: Secondary | ICD-10-CM | POA: Insufficient documentation

## 2023-11-11 DIAGNOSIS — R079 Chest pain, unspecified: Principal | ICD-10-CM | POA: Diagnosis present

## 2023-11-11 DIAGNOSIS — Z79899 Other long term (current) drug therapy: Secondary | ICD-10-CM | POA: Insufficient documentation

## 2023-11-11 DIAGNOSIS — N1832 Chronic kidney disease, stage 3b: Secondary | ICD-10-CM | POA: Insufficient documentation

## 2023-11-11 DIAGNOSIS — I130 Hypertensive heart and chronic kidney disease with heart failure and stage 1 through stage 4 chronic kidney disease, or unspecified chronic kidney disease: Secondary | ICD-10-CM | POA: Insufficient documentation

## 2023-11-11 DIAGNOSIS — E1122 Type 2 diabetes mellitus with diabetic chronic kidney disease: Secondary | ICD-10-CM | POA: Insufficient documentation

## 2023-11-11 LAB — CBC WITH DIFFERENTIAL/PLATELET
Abs Immature Granulocytes: 0.05 10*3/uL (ref 0.00–0.07)
Basophils Absolute: 0 10*3/uL (ref 0.0–0.1)
Basophils Relative: 0 %
Eosinophils Absolute: 0.2 10*3/uL (ref 0.0–0.5)
Eosinophils Relative: 3 %
HCT: 40.1 % (ref 39.0–52.0)
Hemoglobin: 13.5 g/dL (ref 13.0–17.0)
Immature Granulocytes: 1 %
Lymphocytes Relative: 28 %
Lymphs Abs: 2 10*3/uL (ref 0.7–4.0)
MCH: 27.8 pg (ref 26.0–34.0)
MCHC: 33.7 g/dL (ref 30.0–36.0)
MCV: 82.7 fL (ref 80.0–100.0)
Monocytes Absolute: 0.9 10*3/uL (ref 0.1–1.0)
Monocytes Relative: 12 %
Neutro Abs: 3.9 10*3/uL (ref 1.7–7.7)
Neutrophils Relative %: 56 %
Platelets: 238 10*3/uL (ref 150–400)
RBC: 4.85 MIL/uL (ref 4.22–5.81)
RDW: 15.7 % — ABNORMAL HIGH (ref 11.5–15.5)
WBC: 7.2 10*3/uL (ref 4.0–10.5)
nRBC: 0 % (ref 0.0–0.2)

## 2023-11-11 LAB — GLUCOSE, CAPILLARY
Glucose-Capillary: 107 mg/dL — ABNORMAL HIGH (ref 70–99)
Glucose-Capillary: 109 mg/dL — ABNORMAL HIGH (ref 70–99)

## 2023-11-11 LAB — CBC
HCT: 38.5 % — ABNORMAL LOW (ref 39.0–52.0)
Hemoglobin: 12.8 g/dL — ABNORMAL LOW (ref 13.0–17.0)
MCH: 27.5 pg (ref 26.0–34.0)
MCHC: 33.2 g/dL (ref 30.0–36.0)
MCV: 82.8 fL (ref 80.0–100.0)
Platelets: 209 10*3/uL (ref 150–400)
RBC: 4.65 MIL/uL (ref 4.22–5.81)
RDW: 16 % — ABNORMAL HIGH (ref 11.5–15.5)
WBC: 6.1 10*3/uL (ref 4.0–10.5)
nRBC: 0 % (ref 0.0–0.2)

## 2023-11-11 LAB — COMPREHENSIVE METABOLIC PANEL WITH GFR
ALT: 48 U/L — ABNORMAL HIGH (ref 0–44)
AST: 35 U/L (ref 15–41)
Albumin: 3.5 g/dL (ref 3.5–5.0)
Alkaline Phosphatase: 88 U/L (ref 38–126)
Anion gap: 7 (ref 5–15)
BUN: 13 mg/dL (ref 6–20)
CO2: 24 mmol/L (ref 22–32)
Calcium: 9.2 mg/dL (ref 8.9–10.3)
Chloride: 110 mmol/L (ref 98–111)
Creatinine, Ser: 1.46 mg/dL — ABNORMAL HIGH (ref 0.61–1.24)
GFR, Estimated: 57 mL/min — ABNORMAL LOW (ref >60)
Glucose, Bld: 126 mg/dL — ABNORMAL HIGH (ref 70–99)
Potassium: 3.7 mmol/L (ref 3.5–5.1)
Sodium: 141 mmol/L (ref 135–145)
Total Bilirubin: 0.7 mg/dL (ref 0.0–1.2)
Total Protein: 6.8 g/dL (ref 6.5–8.1)

## 2023-11-11 LAB — TROPONIN I (HIGH SENSITIVITY)
Troponin I (High Sensitivity): 35 ng/L — ABNORMAL HIGH (ref <18)
Troponin I (High Sensitivity): 46 ng/L — ABNORMAL HIGH (ref <18)
Troponin I (High Sensitivity): 82 ng/L — ABNORMAL HIGH (ref <18)
Troponin I (High Sensitivity): 75 ng/L — ABNORMAL HIGH (ref <18)

## 2023-11-11 LAB — CBG MONITORING, ED: Glucose-Capillary: 111 mg/dL — ABNORMAL HIGH (ref 70–99)

## 2023-11-11 LAB — URINALYSIS, ROUTINE W REFLEX MICROSCOPIC
Bacteria, UA: NONE SEEN
Bilirubin Urine: NEGATIVE
Glucose, UA: >=500 mg/dL — AB
Hgb urine dipstick: NEGATIVE
Ketones, ur: NEGATIVE mg/dL
Leukocytes,Ua: NEGATIVE
Nitrite: NEGATIVE
Protein, ur: NEGATIVE mg/dL
RBC / HPF: 0 RBC/hpf (ref 0–5)
Specific Gravity, Urine: 1.011 (ref 1.005–1.030)
Squamous Epithelial / HPF: 0 /HPF (ref 0–5)
pH: 5 (ref 5.0–8.0)

## 2023-11-11 LAB — CREATININE, SERUM
Creatinine, Ser: 1.38 mg/dL — ABNORMAL HIGH (ref 0.61–1.24)
GFR, Estimated: >60 mL/min (ref >60)

## 2023-11-11 LAB — BRAIN NATRIURETIC PEPTIDE: B Natriuretic Peptide: 74.7 pg/mL (ref 0.0–100.0)

## 2023-11-11 MED ORDER — ACETAMINOPHEN 325 MG PO TABS
650.0000 mg | ORAL_TABLET | Freq: Four times a day (QID) | ORAL | Status: DC | PRN
  Administered 2023-11-12: 650 mg via ORAL
  Filled 2023-11-11: qty 2

## 2023-11-11 MED ORDER — ISOSORBIDE MONONITRATE ER 60 MG PO TB24
60.0000 mg | ORAL_TABLET | Freq: Every day | ORAL | Status: DC
  Administered 2023-11-11 – 2023-11-12 (×2): 60 mg via ORAL
  Filled 2023-11-11 (×2): qty 1

## 2023-11-11 MED ORDER — RANOLAZINE ER 500 MG PO TB12
500.0000 mg | ORAL_TABLET | Freq: Two times a day (BID) | ORAL | Status: DC
  Administered 2023-11-11 – 2023-11-12 (×2): 500 mg via ORAL
  Filled 2023-11-11 (×2): qty 1

## 2023-11-11 MED ORDER — HEPARIN SODIUM (PORCINE) 5000 UNIT/ML IJ SOLN: 5000.0000 [IU] | Freq: Three times a day (TID) | INTRAMUSCULAR | Status: DC

## 2023-11-11 MED ORDER — LABETALOL HCL 5 MG/ML IV SOLN
5.0000 mg | Freq: Once | INTRAVENOUS | Status: AC
  Administered 2023-11-11: 5 mg via INTRAVENOUS
  Filled 2023-11-11: qty 4

## 2023-11-11 MED ORDER — ONDANSETRON HCL 4 MG/2ML IJ SOLN: 4.0000 mg | Freq: Four times a day (QID) | INTRAMUSCULAR | Status: DC | PRN

## 2023-11-11 MED ORDER — ENOXAPARIN SODIUM 60 MG/0.6ML IJ SOSY
55.0000 mg | PREFILLED_SYRINGE | INTRAMUSCULAR | Status: DC
  Administered 2023-11-11 – 2023-11-12 (×2): 55 mg via SUBCUTANEOUS
  Filled 2023-11-11 (×2): qty 0.6

## 2023-11-11 MED ORDER — MORPHINE SULFATE (PF) 2 MG/ML IV SOLN: 2.0000 mg | INTRAVENOUS | Status: DC | PRN

## 2023-11-11 MED ORDER — ACETAMINOPHEN 650 MG RE SUPP: 650.0000 mg | Freq: Four times a day (QID) | RECTAL | Status: DC | PRN

## 2023-11-11 MED ORDER — FAMOTIDINE 20 MG PO TABS
20.0000 mg | ORAL_TABLET | Freq: Two times a day (BID) | ORAL | Status: DC
  Administered 2023-11-11 – 2023-11-12 (×2): 20 mg via ORAL
  Filled 2023-11-11 (×2): qty 1

## 2023-11-11 MED ORDER — MORPHINE SULFATE (PF) 4 MG/ML IV SOLN
4.0000 mg | Freq: Once | INTRAVENOUS | Status: AC
  Administered 2023-11-11: 4 mg via INTRAVENOUS
  Filled 2023-11-11: qty 1

## 2023-11-11 MED ORDER — ACETAMINOPHEN 325 MG PO TABS: 650.0000 mg | ORAL_TABLET | ORAL | Status: DC | PRN

## 2023-11-11 MED ORDER — INSULIN ASPART 100 UNIT/ML IJ SOLN: 0.0000 [IU] | Freq: Three times a day (TID) | INTRAMUSCULAR | Status: DC

## 2023-11-11 MED ORDER — ALBUTEROL SULFATE (2.5 MG/3ML) 0.083% IN NEBU: 2.5000 mg | INHALATION_SOLUTION | RESPIRATORY_TRACT | Status: DC | PRN

## 2023-11-11 MED ORDER — CLOPIDOGREL BISULFATE 75 MG PO TABS
75.0000 mg | ORAL_TABLET | Freq: Every day | ORAL | Status: DC
  Administered 2023-11-11 – 2023-11-12 (×2): 75 mg via ORAL
  Filled 2023-11-11 (×2): qty 1

## 2023-11-11 MED ORDER — NITROGLYCERIN 0.4 MG SL SUBL
0.4000 mg | SUBLINGUAL_TABLET | SUBLINGUAL | Status: DC | PRN
  Administered 2023-11-12: 0.4 mg via SUBLINGUAL
  Filled 2023-11-11: qty 1

## 2023-11-11 MED ORDER — ATORVASTATIN CALCIUM 80 MG PO TABS
80.0000 mg | ORAL_TABLET | Freq: Every evening | ORAL | Status: DC
  Administered 2023-11-11: 80 mg via ORAL
  Filled 2023-11-11: qty 1

## 2023-11-11 MED ORDER — EMPAGLIFLOZIN 10 MG PO TABS
10.0000 mg | ORAL_TABLET | Freq: Every day | ORAL | Status: DC
  Administered 2023-11-11 – 2023-11-12 (×2): 10 mg via ORAL
  Filled 2023-11-11 (×2): qty 1

## 2023-11-11 MED ORDER — CARVEDILOL 3.125 MG PO TABS
3.1250 mg | ORAL_TABLET | Freq: Two times a day (BID) | ORAL | Status: DC
  Administered 2023-11-11 – 2023-11-12 (×3): 3.125 mg via ORAL
  Filled 2023-11-11 (×3): qty 1

## 2023-11-11 NOTE — ED Triage Notes (Signed)
 Pt reports since yesterday he has had chest pain and HTN, pt reports he took 1 nitroglycerin  PTA as well as pepcid  lasix  and ranolizine

## 2023-11-11 NOTE — Telephone Encounter (Signed)
 Patient currently in ED for chest pain. Will reach out to patient next week

## 2023-11-11 NOTE — Plan of Care (Signed)
  Problem: Activity: Goal: Ability to tolerate increased activity will improve Outcome: Progressing   Problem: Clinical Measurements: Goal: Diagnostic test results will improve Outcome: Progressing   Problem: Coping: Goal: Level of anxiety will decrease Outcome: Progressing

## 2023-11-11 NOTE — ED Provider Notes (Signed)
 [facility] Provider Note    Event Date/Time   First MD Initiated Contact with Patient 11/11/23 [phone]     (approximate)   History   Chest Pain   HPI  Clarence [name] is a 53 y.o. male with history of CHF, CAD status post PCI, CKD, pulmonary hypertension, and Crohn's disease who presents with chest pain, substernal, intermittent over the course of the day and now more constant for the last few hours.  It is not relieved by famotidine  or nitroglycerin .  He describes it as pressure-like but nonradiating.  He has some associated lightheadedness but no nausea or vomiting.  He does not feel short of breath.  He has no leg swelling.  I reviewed the past medical records.  The patient was admitted to the hospital service earlier this month with an acute viral gastroenteritis.   Physical Exam   Triage Vital Signs: ED Triage Vitals  Encounter Vitals Group     BP 11/11/23 0235 (!) 210/136     Girls Systolic BP Percentile --      Girls Diastolic BP Percentile --      Boys Systolic BP Percentile --      Boys Diastolic BP Percentile --      Pulse Rate 11/11/23 0235 88     Resp 11/11/23 0235 18     Temp 11/11/23 0235 98.7 F (37.1 C)     Temp src --      SpO2 11/11/23 0235 95 %     Weight 11/11/23 0233 240 lb (108.9 kg)     Height 11/11/23 0233 5' 10 (1.778 m)     Head Circumference --      Peak Flow --      Pain Score 11/11/23 0233 4     Pain Loc --      Pain Education --      Exclude from Growth Chart --     Most recent vital signs: Vitals:   11/11/23 0545 11/11/23 0630  BP: (!) 165/104 (!) 155/103  Pulse: 70 69  Resp: (!) 23 17  Temp:    SpO2: 100% 98%     General: Alert, uncomfortable appearing but in no distress.  CV:  Good peripheral perfusion.  Normal heart sounds. Resp:  Normal effort.  Lungs CTAB. Abd:  No distention.  Other:  No peripheral edema.   ED Results / Procedures / Treatments   Labs (all labs ordered are listed, but  only abnormal results are displayed) Labs Reviewed  COMPREHENSIVE METABOLIC PANEL WITH GFR - Abnormal; Notable for the following components:      Result Value   Glucose, Bld 126 (*)    Creatinine, Ser 1.46 (*)    ALT 48 (*)    GFR, Estimated 57 (*)    All other components within normal limits  CBC WITH DIFFERENTIAL/PLATELET - Abnormal; Notable for the following components:   RDW 15.7 (*)    All other components within normal limits  TROPONIN I (HIGH SENSITIVITY) - Abnormal; Notable for the following components:   Troponin I (High Sensitivity) 35 (*)    All other components within normal limits  TROPONIN I (HIGH SENSITIVITY) - Abnormal; Notable for the following components:   Troponin I (High Sensitivity) 46 (*)    All other components within normal limits  BRAIN NATRIURETIC PEPTIDE     EKG  ED ECG REPORT I, Waylon Cassis, the attending physician, personally viewed and interpreted this ECG.  Date: 11/11/2023 EKG Time:  9756 Rate: 87 Rhythm: normal sinus rhythm QRS Axis: normal Intervals: normal ST/T Wave abnormalities: New T wave inversions inferior and lateral when compared to EKG of 10/19/2023 Narrative Interpretation: Nonspecific abnormalities with no evidence of acute ischemia    RADIOLOGY  Chest x-ray: I independently viewed and interpreted the images; there is no focal consolidation or edema  PROCEDURES:  Critical Care performed: No  Procedures   MEDICATIONS ORDERED IN ED: Medications  atorvastatin  (LIPITOR ) tablet 80 mg (has no administration in time range)  carvedilol  (COREG ) tablet 3.125 mg (has no administration in time range)  isosorbide  mononitrate (IMDUR ) 24 hr tablet 60 mg (has no administration in time range)  clopidogrel  (PLAVIX ) tablet 75 mg (has no administration in time range)  nitroGLYCERIN  (NITROSTAT ) SL tablet 0.4 mg (has no administration in time range)  acetaminophen  (TYLENOL ) tablet 650 mg (has no administration in time range)   ondansetron  (ZOFRAN ) injection 4 mg (has no administration in time range)  enoxaparin  (LOVENOX ) injection 55 mg (has no administration in time range)  morphine  (PF) 2 MG/ML injection 2 mg (has no administration in time range)  morphine  (PF) 4 MG/ML injection 4 mg (4 mg Intravenous Given 11/11/23 0259)  labetalol  (NORMODYNE ) injection 5 mg (5 mg Intravenous Given 11/11/23 0258)     IMPRESSION / MDM / ASSESSMENT AND PLAN / ED COURSE  I reviewed the triage vital signs and the nursing notes.  53 year old male with PMH as noted above presents with chest pain over the course of the day today initially intermittent and now more constant.  He is significantly hypertensive with otherwise normal vital signs.  Physical exam is unremarkable for acute findings.  EKG shows nonspecific T wave abnormalities but is overall nonischemic appearing.  Differential diagnosis includes, but is not limited to, ACS, GERD, musculoskeletal pain.  I have a low suspicion for PE given the lack of tachycardia or hypoxia and the intermittent nature of the pain.  It is also not consistent with aortic dissection or other vascular cause.  Will obtain chest x-ray, lab workup, give IV labetalol  for blood pressure control, and reassess.  Patient's presentation is most consistent with acute presentation with potential threat to life or bodily function.  The patient is on the cardiac monitor to evaluate for evidence of arrhythmia and/or significant heart rate changes.   ----------------------------------------- 6:30 AM on 11/11/2023 -----------------------------------------  Repeat troponin is uptrending.  The patient will need admission for further monitoring and workup.  I consulted Dr. Cleatus from the hospitalist service; based on our discussion she agrees to evaluate the patient for admission.  FINAL CLINICAL IMPRESSION(S) / ED DIAGNOSES   Final diagnoses:  Chest pain, unspecified type     Rx / DC Orders   ED  Discharge Orders     None        Note:  This document was prepared using Dragon voice recognition software and may include unintentional dictation errors.    Jacolyn Pae, MD 11/11/23 [phone]

## 2023-11-11 NOTE — Progress Notes (Signed)
 PHARMACIST - PHYSICIAN COMMUNICATION  CONCERNING:  Enoxaparin  (Lovenox ) for DVT Prophylaxis    RECOMMENDATION: Patient was prescribed enoxaprin 40mg  q24 hours for VTE prophylaxis.   Filed Weights   11/11/23 0233  Weight: 108.9 kg (240 lb)    Body mass index is 34.44 kg/m.  Estimated Creatinine Clearance: 71.5 mL/min (A) (by C-G formula based on SCr of 1.46 mg/dL (H)).   Based on [facility] policy patient is candidate for enoxaparin  0.5mg /kg TBW SQ every 24 hours based on BMI being >30.  DESCRIPTION: Pharmacy has adjusted enoxaparin  dose per [facility] policy.  Patient is now receiving enoxaparin  0.5 mg/kg every 24 hours   Rankin CANDIE Dills, PharmD, [facility] 11/11/2023 6:25 AM

## 2023-11-11 NOTE — H&P (Signed)
 History and Physical    KARY COLAIZZI FMW:[id number] DOB: 10-Aug-1970 DOA: 11/11/2023  PCP: Leonarda Roxan BROCKS, NP  Patient coming from: home  I have personally briefly reviewed patient's old medical records in [facility]  Chief Complaint: chest pain  and well as uncontrolled blood pressure x 1 day  HPI: Clarence [name] is a 53 y.o. male with medical history significant of  colonic Crohn's disease status post colectomy with end ileostomy in 2008 at [facility].CHFref ( 40-45%), CKDIIIb, CAD s/p PCI ,  pulmonary HTN, HTN, DMII who presents to ED with complaint of chest pain x 24 hours as well as uncontrolled hypertension. Patient states at onset of chest pain he took nitroglycerin  as well as pepcid . Patient described pain as a central pressure like sensation w/o radiation. He notes no associated n/v/d or sob or diaphoresis but dis note periods of light headedness.   ED Course:  Patient on evaluation found to have hypertensive urgency with bp 210/136, patient was started on nitroglycerin  drip and slated for admission .  In ED Vitals: afeb, bp 210/136, hr 88, rr18, sat 95%  EKG: nsr ,  q in inferior leads  no significant change form prior EKG 10/19/2023 BNP-74.7 Wbc 7.2, hgb 13.5, plt 238,  Na 141, K 3.7, CL 110, glu 126, cr 1.46 CE35 ( 46) Cxr: NAD   Tx labetalol  5mg , morphine  4mg    Review of Systems: As per HPI otherwise 10 point review of systems negative.   Past Medical History:  Diagnosis Date   Ascending Aortic Dilation    a. 09/2021 Echo: Asc Ao 40mm.   CAD (coronary artery disease)    a. 07/2021 Cath: LM nl, LAD 39m, D2 40, LCX 65p/m, OM1 90, RCA 112m w/ L-.R collats to RPDA from septal 1/2-->Med rx.   Chronic HFrEF (heart failure with reduced ejection fraction) (HCC)    a. 07/2021 Echo: EF 30-35%, glob HK, mod LVH, GrII DD, nl RV fxn, mildly dil LA, mild-mod MR; b. 09/2021 Echo: EF 35-40%, glob HK, mod LVH, GrI DD, nl RV fxn, mildly dil RA, mild MR, Asc Ao 40mm.   CKD (chronic  kidney disease) stage 2, GFR 60-89 ml/min    Crohn's disease (HCC)    Hypertension    Mixed Ischemic & Nonischemic cardiomyopathy    a. 07/2021 Echo: EF 30-35%; b. 07/2021 Cath: Occluded RCA w/ mod LAD/LCX dzs, and severe OM1 dzs-->Med Rx; c. 09/2021 Echo: EF 35-40%.   PAH (pulmonary artery hypertension) (HCC)    a. 07/2021 RHC: PA 67/30 (42).   Proteinuria     Past Surgical History:  Procedure Laterality Date   COLECTOMY     CORONARY STENT INTERVENTION N/A 03/02/2023   Procedure: CORONARY STENT INTERVENTION;  Surgeon: Mady Bruckner, MD;  Location: [facility] INVASIVE CV LAB;  Service: Cardiovascular;  Laterality: N/A;   LEFT HEART CATH AND CORONARY ANGIOGRAPHY Left 03/02/2023   Procedure: LEFT HEART CATH AND CORONARY ANGIOGRAPHY;  Surgeon: Mady Bruckner, MD;  Location: [facility] INVASIVE CV LAB;  Service: Cardiovascular;  Laterality: Left;   RIGHT/LEFT HEART CATH AND CORONARY ANGIOGRAPHY N/A 08/07/2021   Procedure: RIGHT/LEFT HEART CATH AND CORONARY ANGIOGRAPHY;  Surgeon: Mady Bruckner, MD;  Location: [facility] INVASIVE CV LAB;  Service: Cardiovascular;  Laterality: N/A;   RIGHT/LEFT HEART CATH AND CORONARY ANGIOGRAPHY N/A 09/29/2022   Procedure: RIGHT/LEFT HEART CATH AND CORONARY ANGIOGRAPHY;  Surgeon: Rolan Ezra RAMAN, MD;  Location: [facility] INVASIVE CV LAB;  Service: Cardiovascular;  Laterality: N/A;     reports  that he quit smoking about 2 years ago. His smoking use included cigars. He does not have any smokeless tobacco history on file. He reports that he does not currently use alcohol after a past usage of about 1.0 standard drink of alcohol per week. He reports that he does not use drugs.  Allergies  Allergen Reactions   Ceftriaxone  Rash    Macular rash with itchness   Ibuprofen Other (See Comments)    History of GIB with ibuprofen    Sulfacetamide Other (See Comments)   Neomycin-Bacitracin Zn-Polymyx     Other reaction(s): Other (See Comments)   Spironolactone      Hyperkalemia    Family  History  Problem Relation Age of Onset   Hypertension Mother    Cancer Maternal Aunt    Heart disease Maternal Uncle    Pancreatic cancer Maternal Uncle      Prior to Admission medications   Medication Sig Start Date End Date Taking? Authorizing Provider  aluminum -magnesium  hydroxide-simethicone  (MAALOX) 200-200-20 MG/5ML SUSP Take 30 mLs by mouth 4 (four) times daily -  before meals and at bedtime. 10/20/23 11/19/23 Yes Maree Hue, MD  atorvastatin  (LIPITOR ) 80 MG tablet Take 1 tablet (80 mg total) by mouth every evening. 10/20/23  Yes Maree Hue, MD  carvedilol  (COREG ) 3.125 MG tablet Take 1 tablet (3.125 mg total) by mouth 2 (two) times daily. 10/20/23 11/19/23 Yes Maree Hue, MD  clopidogrel  (PLAVIX ) 75 MG tablet Take 1 tablet (75 mg total) by mouth daily. 10/20/23 11/19/23 Yes Maree Hue, MD  empagliflozin  (JARDIANCE ) 10 MG TABS tablet Take 10 mg by mouth daily.   Yes [provider]  famotidine  (PEPCID ) 20 MG tablet Take 20 mg by mouth 2 (two) times daily.   Yes [provider]  furosemide  (LASIX ) 20 MG tablet Take 1 tablet (20 mg total) by mouth daily as needed. For weight gain of 3 lb in 24 hours or 5 lbs in one week 10/20/23  Yes Maree Hue, MD  isosorbide  mononitrate (IMDUR ) 60 MG 24 hr tablet Take 1 tablet (60 mg total) by mouth daily. 10/20/23 11/19/23 Yes Maree Hue, MD  pantoprazole  (PROTONIX ) 40 MG tablet Take 1 tablet (40 mg total) by mouth daily. 10/20/23 11/19/23 Yes Maree Hue, MD  ranolazine  (RANEXA ) 500 MG 12 hr tablet Take 500 mg by mouth 2 (two) times daily.   Yes [provider]  dapagliflozin  propanediol (FARXIGA ) 10 MG TABS tablet Take by mouth daily. Patient not taking: No sig reported    [provider]  fenofibrate  (TRICOR ) 48 MG tablet Take 1 tablet (48 mg total) by mouth daily. Patient not taking: No sig reported 10/20/23 11/19/23  Maree Hue, MD    Physical Exam: Vitals:   11/11/23 0530 11/11/23 0545 11/11/23 0630 11/11/23 0731  BP: (!)  162/109 (!) 165/104 (!) 155/103 (!) 152/91  Pulse: 86 70 69 68  Resp: (!) 21 (!) 23 17 12   Temp:    97.7 F (36.5 C)  TempSrc:    Oral  SpO2: 97% 100% 98% 100%  Weight:      Height:        Constitutional: NAD, calm, comfortable Vitals:   11/11/23 0530 11/11/23 0545 11/11/23 0630 11/11/23 0731  BP: (!) 162/109 (!) 165/104 (!) 155/103 (!) 152/91  Pulse: 86 70 69 68  Resp: (!) 21 (!) 23 17 12   Temp:    97.7 F (36.5 C)  TempSrc:    Oral  SpO2: 97% 100% 98% 100%  Weight:  Height:       Eyes: PERRL, lids and conjunctivae normal ENMT: Mucous membranes are moist. Posterior pharynx clear of any exudate or lesions.Normal dentition.  Neck: normal, supple, no masses, no thyromegaly Respiratory: clear to auscultation bilaterally, no wheezing, no crackles. Normal respiratory effort. No accessory muscle use.  Cardiovascular: Regular rate and rhythm, no murmurs / rubs / gallops. No extremity edema. 2+ pedal pulses.  Abdomen: no tenderness, no masses palpated. No hepatosplenomegaly. Bowel sounds positive. Ostomy back with brown formed stool Musculoskeletal: no clubbing / cyanosis. No joint deformity upper and lower extremities. Good ROM, no contractures. Normal muscle tone.  Skin: no rashes, lesions, ulcers. No induration Neurologic: CN 2-12 grossly intact. Sensation intact, MAE x 4 Psychiatric: Normal judgment and insight. Alert and oriented x 3. Normal mood.    Labs on Admission: I have personally reviewed following labs and imaging studies  CBC: Recent Labs  Lab 11/08/23 1527 11/11/23 0256  WBC 7.5 7.2  NEUTROABS 4.9 3.9  HGB 13.9 13.5  HCT 41.0 40.1  MCV 80.1 82.7  PLT 213.0 238   Basic Metabolic Panel: Recent Labs  Lab 11/08/23 1527 11/11/23 0256  NA 141 141  K 3.7 3.7  CL 109 110  CO2 23 24  GLUCOSE 116* 126*  BUN 14 13  CREATININE 1.72* 1.46*  CALCIUM  9.4 9.2   GFR: Estimated Creatinine Clearance: 71.5 mL/min (A) (by C-G formula based on SCr of 1.46 mg/dL  (H)). Liver Function Tests: Recent Labs  Lab 11/08/23 1527 11/11/23 0256  AST 19 35  ALT 33 48*  ALKPHOS 101 88  BILITOT 0.5 0.7  PROT 6.9 6.8  ALBUMIN 4.1 3.5   No results for input(s): LIPASE, AMYLASE in the last 168 hours. No results for input(s): AMMONIA in the last 168 hours. Coagulation Profile: No results for input(s): INR, PROTIME in the last 168 hours. Cardiac Enzymes: No results for input(s): CKTOTAL, CKMB, CKMBINDEX, TROPONINI in the last 168 hours. BNP (last 3 results) No results for input(s): PROBNP in the last 8760 hours. HbA1C: No results for input(s): HGBA1C in the last 72 hours. CBG: No results for input(s): GLUCAP in the last 168 hours. Lipid Profile: No results for input(s): CHOL, HDL, LDLCALC, TRIG, CHOLHDL, LDLDIRECT in the last 72 hours. Thyroid  Function Tests: Recent Labs    11/08/23 1527  TSH 1.19   Anemia Panel: No results for input(s): VITAMINB12, FOLATE, FERRITIN, TIBC, IRON, RETICCTPCT in the last 72 hours. Urine analysis:    Component Value Date/Time   COLORURINE YELLOW (A) 06/12/2023 0928   COLORURINE YELLOW (A) 06/12/2023 0928   APPEARANCEUR HAZY (A) 06/12/2023 0928   APPEARANCEUR HAZY (A) 06/12/2023 0928   APPEARANCEUR Clear 10/24/2012 1000   LABSPEC 1.023 06/12/2023 0928   LABSPEC 1.024 06/12/2023 0928   LABSPEC 1.015 10/24/2012 1000   PHURINE 5.0 06/12/2023 0928   PHURINE 5.0 06/12/2023 0928   GLUCOSEU 50 (A) 06/12/2023 0928   GLUCOSEU 50 (A) 06/12/2023 0928   GLUCOSEU Negative 10/24/2012 1000   HGBUR NEGATIVE 06/12/2023 0928   HGBUR NEGATIVE 06/12/2023 0928   BILIRUBINUR NEGATIVE 06/12/2023 0928   BILIRUBINUR NEGATIVE 06/12/2023 0928   BILIRUBINUR Negative 10/24/2012 1000   KETONESUR NEGATIVE 06/12/2023 0928   KETONESUR NEGATIVE 06/12/2023 0928   PROTEINUR NEGATIVE 06/12/2023 0928   PROTEINUR NEGATIVE 06/12/2023 0928   NITRITE NEGATIVE 06/12/2023 0928   NITRITE NEGATIVE  06/12/2023 0928   LEUKOCYTESUR NEGATIVE 06/12/2023 0928   LEUKOCYTESUR NEGATIVE 06/12/2023 0928   LEUKOCYTESUR Negative 10/24/2012 1000  Radiological Exams on Admission: DG Chest 2 View Result Date: 11/11/2023 CLINICAL DATA:  Chest pain EXAM: CHEST - 2 VIEW COMPARISON:  10/18/2023 FINDINGS: The heart size and mediastinal contours are within normal limits. Both lungs are clear. The visualized skeletal structures are unremarkable. IMPRESSION: No active cardiopulmonary disease. Electronically Signed   By: Oneil Devonshire M.D.   On: 11/11/2023 03:39    EKG: Independently reviewed. See above  Assessment/Plan Hypertensive Emergency -admit to progressive care  - patient started on nitroglycerin  drip in ED with improvement in symptoms  - wean drip as able  -resume home oral medications - cycle ce,  -f/u on echo   Chest pain with associated Abn CE -Hx of CAD s/p pci  -presumed due to demand from uncontrolled hypertension  -continue to cycle CE to r/o type I  -echo in am to be complete  -currently chest pain free  -resume cardiac medications  CHFref -current euvolemic -not on GDMT currently due to hx of recent AKI on medications in setting of high out put ostomy - on lasix  20 mg prn currently  -follows with Dr Zenaida  CKDIIIb  - at baseline  Chron's disease  -status post colectomy with end ileostomy in 2008  -no current flare  - has  stable output from ostomy   DMII  -iss/ fs  - last A1c 6.6  DVT prophylaxis: heparin  Code Status full/ as discussed per patient wishes in event of cardiac arrest  Family Communication: none at bedside Disposition Plan: patient  expected to be admitted greater than 2 midnights  Consults called: none Admission status: progressive care   Camila DELENA Ned MD [facility]   If 7PM-7AM, please contact night-coverage [email or web address] Password [email or web address]  11/11/2023, 9:11 AM

## 2023-11-12 DIAGNOSIS — I160 Hypertensive urgency: Secondary | ICD-10-CM

## 2023-11-12 LAB — CBC
HCT: 39.8 % (ref 39.0–52.0)
Hemoglobin: 13.4 g/dL (ref 13.0–17.0)
MCH: 27.7 pg (ref 26.0–34.0)
MCHC: 33.7 g/dL (ref 30.0–36.0)
MCV: 82.2 fL (ref 80.0–100.0)
Platelets: 212 10*3/uL (ref 150–400)
RBC: 4.84 MIL/uL (ref 4.22–5.81)
RDW: 15.6 % — ABNORMAL HIGH (ref 11.5–15.5)
WBC: 6.3 10*3/uL (ref 4.0–10.5)
nRBC: 0 % (ref 0.0–0.2)

## 2023-11-12 LAB — COMPREHENSIVE METABOLIC PANEL WITH GFR
ALT: 40 U/L (ref 0–44)
AST: 26 U/L (ref 15–41)
Albumin: 3.2 g/dL — ABNORMAL LOW (ref 3.5–5.0)
Alkaline Phosphatase: 72 U/L (ref 38–126)
Anion gap: 7 (ref 5–15)
BUN: 14 mg/dL (ref 6–20)
CO2: 26 mmol/L (ref 22–32)
Calcium: 9 mg/dL (ref 8.9–10.3)
Chloride: 112 mmol/L — ABNORMAL HIGH (ref 98–111)
Creatinine, Ser: 1.13 mg/dL (ref 0.61–1.24)
GFR, Estimated: >60 mL/min (ref >60)
Glucose, Bld: 113 mg/dL — ABNORMAL HIGH (ref 70–99)
Potassium: 3.8 mmol/L (ref 3.5–5.1)
Sodium: 145 mmol/L (ref 135–145)
Total Bilirubin: 1.2 mg/dL (ref 0.0–1.2)
Total Protein: 6.3 g/dL — ABNORMAL LOW (ref 6.5–8.1)

## 2023-11-12 LAB — GLUCOSE, CAPILLARY: Glucose-Capillary: 110 mg/dL — ABNORMAL HIGH (ref 70–99)

## 2023-11-12 MED ORDER — PANTOPRAZOLE SODIUM 40 MG PO TBEC: 40.0000 mg | DELAYED_RELEASE_TABLET | Freq: Every day | ORAL | Status: DC

## 2023-11-12 MED ORDER — ENTRESTO 24-26 MG PO TABS: 1.0000 | ORAL_TABLET | Freq: Two times a day (BID) | ORAL | 0 refills | Status: AC

## 2023-11-12 MED ORDER — ALUM & MAG HYDROXIDE-SIMETH 200-200-20 MG/5ML PO SUSP: 30.0000 mL | Freq: Three times a day (TID) | ORAL | Status: DC

## 2023-11-12 NOTE — Plan of Care (Signed)
  Problem: Cardiac: Goal: Ability to achieve and maintain adequate cardiovascular perfusion will improve Outcome: Progressing   Problem: Clinical Measurements: Goal: Ability to maintain clinical measurements within normal limits will improve Outcome: Progressing   Problem: Pain Managment: Goal: General experience of comfort will improve and/or be controlled Outcome: Progressing

## 2023-11-12 NOTE — Discharge Summary (Signed)
 Physician Discharge Summary  Clarence [name] FMW:[id number] DOB: June 30, 1970 DOA: 11/11/2023  PCP: Leonarda Roxan BROCKS, NP  Admit date: 11/11/2023 Discharge date: 11/12/2023  Admitted From: Home Disposition:  Home  Recommendations for Outpatient Follow-up:  Follow up with PCP in 1-2 weeks Follow up with advanced heart failure in 1 week  Home Health:No  Equipment/Devices:None   Discharge Condition:Stable  CODE STATUS:FULL  Diet recommendation: Heart healthy  Brief/Interim Summary: 53 y.o. male with medical history significant of colonic Crohn's disease status post colectomy with end ileostomy in 2008 at [facility].CHFref ( 40-45%), CKDIIIb, CAD s/p PCI ,  pulmonary HTN, HTN, DMII who presents to ED with complaint of chest pain x 24 hours as well as uncontrolled hypertension. Patient states at onset of chest pain he took nitroglycerin  as well as pepcid . Patient described pain as a central pressure like sensation w/o radiation. He notes no associated n/v/d or sob or diaphoresis but dis note periods of light headedness.  Troponins trended and flat with no significant delta suggest ACS.  Telemetry monitoring was reassuring.  I suspect patient's presentation was secondary to uncontrolled hypertension.  Per review of heart failure notes he was previously taken off Entresto  24/26 due to hypotension in the setting of Crohn's.  This discontinuation is likely the etiology of the patient's hypertensive urgency.  At time of discharge blood pressures 160/101.  Can tolerate reintroduction of Entresto  24/26.  Have restarted this medication on discharge and patient should follow-up with his heart failure team within 1 to 2 weeks of discharge for close monitoring.   Discharge Diagnoses:  Active Problems:   Chest pain  Atypical chest pain Hypertensive urgency Troponins trended.  Flat with no significant delta.  Unlikely to represent ACS.  Suspect hypertensive urgency as primary cause.  At time of discharge we will  restart Entresto  24/26.  Patient was taken off this medication earlier this month due to hypotension.  Blood pressure can now tolerate reintroduction.  Patient should follow-up with his heart failure team within 1 to 2 weeks of discharge for close monitoring of BP.  Discharge Instructions  Discharge Instructions     Diet - low sodium heart healthy   Complete by: As directed    Increase activity slowly   Complete by: As directed       Allergies as of 11/12/2023       Reactions   Ceftriaxone  Rash   Macular rash with itchness   Ibuprofen Other (See Comments)   History of GIB with ibuprofen    Sulfacetamide Other (See Comments)   Neomycin-bacitracin Zn-polymyx    Other reaction(s): Other (See Comments)   Spironolactone     Hyperkalemia        Medication List     STOP taking these medications    Farxiga  10 MG Tabs tablet Generic drug: dapagliflozin  propanediol   fenofibrate  48 MG tablet Commonly known as: Tricor        TAKE these medications    atorvastatin  80 MG tablet Commonly known as: LIPITOR  Take 1 tablet (80 mg total) by mouth every evening.   carvedilol  3.125 MG tablet Commonly known as: COREG  Take 1 tablet (3.125 mg total) by mouth 2 (two) times daily.   clopidogrel  75 MG tablet Commonly known as: PLAVIX  Take 1 tablet (75 mg total) by mouth daily.   Entresto  24-26 MG Generic drug: sacubitril -valsartan  Take 1 tablet by mouth 2 (two) times daily.   famotidine  20 MG tablet Commonly known as: PEPCID  Take 20 mg by mouth 2 (two) times  daily.   furosemide  20 MG tablet Commonly known as: LASIX  Take 1 tablet (20 mg total) by mouth daily as needed. For weight gain of 3 lb in 24 hours or 5 lbs in one week   Geri-Lanta 200-200-20 MG/5ML suspension Generic drug: alum & mag hydroxide-simeth Take 30 mLs by mouth 4 (four) times daily -  before meals and at bedtime.   isosorbide  mononitrate 60 MG 24 hr tablet Commonly known as: IMDUR  Take 1 tablet (60 mg  total) by mouth daily.   Jardiance  10 MG Tabs tablet Generic drug: empagliflozin  Take 10 mg by mouth daily.   pantoprazole  40 MG tablet Commonly known as: PROTONIX  Take 1 tablet (40 mg total) by mouth daily.   ranolazine  500 MG 12 hr tablet Commonly known as: RANEXA  Take 500 mg by mouth 2 (two) times daily.        Follow-up Information     Rolan Ezra RAMAN, MD. Schedule an appointment as soon as possible for a visit in 1 week(s).   Specialty: Cardiology Contact information: [address] [place] [address] [phone]         Ngetich, Roxan BROCKS, NP. Schedule an appointment as soon as possible for a visit in 1 week(s).   Specialty: Family Medicine Contact information: [address] [place] [address] [phone]                Allergies  Allergen Reactions   Ceftriaxone  Rash    Macular rash with itchness   Ibuprofen Other (See Comments)    History of GIB with ibuprofen    Sulfacetamide Other (See Comments)   Neomycin-Bacitracin Zn-Polymyx     Other reaction(s): Other (See Comments)   Spironolactone      Hyperkalemia    Consultations: None   Procedures/Studies: DG Chest 2 View Result Date: 11/11/2023 CLINICAL DATA:  Chest pain EXAM: CHEST - 2 VIEW COMPARISON:  10/18/2023 FINDINGS: The heart size and mediastinal contours are within normal limits. Both lungs are clear. The visualized skeletal structures are unremarkable. IMPRESSION: No active cardiopulmonary disease. Electronically Signed   By: Oneil Devonshire M.D.   On: 11/11/2023 03:39   CT ABDOMEN PELVIS WO CONTRAST Result Date: 10/18/2023 CLINICAL DATA:  Nausea with vomiting and worsening watery output from the osteotomy. Evaluate for acute intra-infection. EXAM: CT ABDOMEN AND PELVIS WITHOUT CONTRAST TECHNIQUE: Multidetector CT imaging of the abdomen and pelvis was performed following the standard protocol without IV contrast. RADIATION DOSE REDUCTION: This exam was performed  according to the departmental dose-optimization program which includes automated exposure control, adjustment of the mA and/or kV according to patient size and/or use of iterative reconstruction technique. COMPARISON:  Abdominopelvic CT 06/12/2023 and 06/06/2023. FINDINGS: Lower chest: The visualized lung bases are now clear with resolution of the previously demonstrated patchy right lower lobe airspace disease. No significant pleural or pericardial effusion. Hepatobiliary: The liver has a stable, non cirrhotic morphology. No focal hepatic lesions are identified on noncontrast imaging. Incomplete gallbladder distention. No evidence of gallstones, gallbladder wall thickening or biliary dilatation. Pancreas: Unremarkable. No pancreatic ductal dilatation or surrounding inflammatory changes. Spleen: Normal in size without focal abnormality. Adrenals/Urinary Tract: Both adrenal glands appear normal. No evidence of urinary tract calculus, hydronephrosis or perinephric soft tissue stranding. Previously demonstrated small cystic lesions in the lower pole of the right kidney are unchanged; no specific follow-up imaging recommended. The bladder appears unremarkable for its degree of distention. Stomach/Bowel: No enteric contrast administered. Status post colectomy with [place] pouch and  end ileostomy in the right lower quadrant. 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 No evidence of incarceration or obstruction. Stable submucosal fat deposition within the [employer]. Mildly prominent ingested material within the stomach. Vascular/Lymphatic: There are no enlarged abdominal or pelvic lymph nodes. Minimal aortoiliac atherosclerosis. Reproductive: The prostate gland and seminal vesicles appear unremarkable. 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 No evidence of ascites or pneumoperitoneum. Musculoskeletal: No acute or  significant osseous findings. Lower lumbar spondylosis. IMPRESSION: 1. No acute findings or explanation for the patient's symptoms. 2. Stable postoperative changes status post colectomy with right lower quadrant end ileostomy. 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 3. Resolution of previously demonstrated right lower lobe airspace disease. 4.  Aortic Atherosclerosis (ICD10-I70.0). Electronically Signed   By: Elsie Perone M.D.   On: 10/18/2023 17:02   CT HEAD WO CONTRAST ( ) Result Date: 10/18/2023 CLINICAL DATA:  Dizziness. EXAM: CT HEAD WITHOUT CONTRAST TECHNIQUE: Contiguous axial images were obtained from the base of the skull through the vertex without intravenous contrast. RADIATION DOSE REDUCTION: This exam was performed according to the departmental dose-optimization program which includes automated exposure control, adjustment of the mA and/or kV according to patient size and/or use of iterative reconstruction technique. COMPARISON:  Head CT 09/14/2021 FINDINGS: Brain: No intracranial hemorrhage, mass effect, or midline shift. No hydrocephalus. The basilar cisterns are patent. No evidence of territorial infarct or acute ischemia. No extra-axial or intracranial fluid collection. Vascular: Atherosclerosis of skullbase vasculature without hyperdense vessel or abnormal calcification. Skull: No fracture or focal lesion. Sinuses/Orbits: Paranasal sinuses and mastoid air cells are clear. The visualized orbits are unremarkable. Other: None. IMPRESSION: No acute intracranial abnormality. Electronically Signed   By: Andrea Gasman M.D.   On: 10/18/2023 16:51   DG Chest Port 1 View Result Date: 10/18/2023 CLINICAL DATA:  Dizziness EXAM: PORTABLE CHEST 1 VIEW COMPARISON:  January 26 25 FINDINGS: The heart size and mediastinal contours are within normal limits. Both lungs are clear. The visualized skeletal structures are  unremarkable. IMPRESSION: No active disease. Electronically Signed   By: Franky Chard M.D.   On: 10/18/2023 15:55      Subjective: Seen and examined on the day of discharge.  Stable no distress.  Appropriate discharge home.  Discharge Exam: Vitals:   11/12/23 0915 11/12/23 1009  BP: (!) 191/124 (!) 160/101  Pulse: 79 72  Resp:    Temp:    SpO2:     Vitals:   11/12/23 0340 11/12/23 0800 11/12/23 0915 11/12/23 1009  BP: (!) 150/93 (!) 141/98 (!) 191/124 (!) 160/101  Pulse: 70 66 79 72  Resp: 18     Temp: 98.2 F (36.8 C) 97.9 F (36.6 C)    TempSrc:  Oral    SpO2: 96% 97%    Weight:      Height:        General: Pt is alert, awake, not in acute distress Cardiovascular: RRR, S1/S2 +, no rubs, no gallops Respiratory: CTA bilaterally, no wheezing, no rhonchi Abdominal: Soft, NT, ND, bowel sounds + Extremities: no edema, no cyanosis    The results of significant diagnostics from this hospitalization (including imaging, microbiology, ancillary and laboratory) are listed below for reference.     Microbiology: No results found for this or any previous visit (from the past 240 hours).   Labs: BNP (last 3 results) Recent Labs    02/23/23  1803 06/24/23 1200 11/11/23 0255  BNP 64.4 24.7 74.7   Basic Metabolic Panel: Recent Labs  Lab 11/08/23 1527 11/11/23 0256 11/11/23 1024 11/12/23 0436  NA 141 141  --  145  K 3.7 3.7  --  3.8  CL 109 110  --  112*  CO2 23 24  --  26  GLUCOSE 116* 126*  --  113*  BUN 14 13  --  14  CREATININE 1.72* 1.46* 1.38* 1.13  CALCIUM  9.4 9.2  --  9.0   Liver Function Tests: Recent Labs  Lab 11/08/23 1527 11/11/23 0256 11/12/23 0436  AST 19 35 26  ALT 33 48* 40  ALKPHOS 101 88 72  BILITOT 0.5 0.7 1.2  PROT 6.9 6.8 6.3*  ALBUMIN 4.1 3.5 3.2*   No results for input(s): LIPASE, AMYLASE in the last 168 hours. No results for input(s): AMMONIA in the last 168 hours. CBC: Recent Labs  Lab 11/08/23 1527 11/11/23 0256  11/11/23 1024 11/12/23 0436  WBC 7.5 7.2 6.1 6.3  NEUTROABS 4.9 3.9  --   --   HGB 13.9 13.5 12.8* 13.4  HCT 41.0 40.1 38.5* 39.8  MCV 80.1 82.7 82.8 82.2  PLT 213.0 238 209 212   Cardiac Enzymes: No results for input(s): CKTOTAL, CKMB, CKMBINDEX, TROPONINI in the last 168 hours. BNP: Invalid input(s): POCBNP CBG: Recent Labs  Lab 11/11/23 1128 11/11/23 1702 11/11/23 2038 11/12/23 0800  GLUCAP 111* 107* 109* 110*   D-Dimer No results for input(s): DDIMER in the last 72 hours. Hgb A1c No results for input(s): HGBA1C in the last 72 hours. Lipid Profile No results for input(s): CHOL, HDL, LDLCALC, TRIG, CHOLHDL, LDLDIRECT in the last 72 hours. Thyroid  function studies No results for input(s): TSH, T4TOTAL, T3FREE, THYROIDAB in the last 72 hours.  Invalid input(s): FREET3 Anemia work up No results for input(s): VITAMINB12, FOLATE, FERRITIN, TIBC, IRON, RETICCTPCT in the last 72 hours. Urinalysis    Component Value Date/Time   COLORURINE STRAW (A) 11/11/2023 1024   APPEARANCEUR CLEAR (A) 11/11/2023 1024   APPEARANCEUR Clear 10/24/2012 1000   LABSPEC 1.011 11/11/2023 1024   LABSPEC 1.015 10/24/2012 1000   PHURINE 5.0 11/11/2023 1024   GLUCOSEU >=500 (A) 11/11/2023 1024   GLUCOSEU Negative 10/24/2012 1000   HGBUR NEGATIVE 11/11/2023 1024   BILIRUBINUR NEGATIVE 11/11/2023 1024   BILIRUBINUR Negative 10/24/2012 1000   KETONESUR NEGATIVE 11/11/2023 1024   PROTEINUR NEGATIVE 11/11/2023 1024   NITRITE NEGATIVE 11/11/2023 1024   LEUKOCYTESUR NEGATIVE 11/11/2023 1024   LEUKOCYTESUR Negative 10/24/2012 1000   Sepsis Labs Recent Labs  Lab 11/08/23 1527 11/11/23 0256 11/11/23 1024 11/12/23 0436  WBC 7.5 7.2 6.1 6.3   Microbiology No results found for this or any previous visit (from the past 240 hours).   Time coordinating discharge: 35 min   SIGNED:   Calvin KATHEE Robson, MD  [facility] 11/12/2023, 11:18  AM Pager   If 7PM-7AM, please contact night-coverage

## 2023-11-22 ENCOUNTER — Telehealth: Payer: Self-pay | Admitting: Family

## 2023-11-22 ENCOUNTER — Ambulatory Visit (HOSPITAL_COMMUNITY)
Admission: RE | Admit: 2023-11-22 | Discharge: 2023-11-22 | Disposition: A | Discharge status: Home / Self Care | Payer: Self-pay | Source: Ambulatory Visit | Attending: Family | Admitting: Family

## 2023-11-22 DIAGNOSIS — Z932 Ileostomy status: Secondary | ICD-10-CM | POA: Insufficient documentation

## 2023-11-22 DIAGNOSIS — K50918 Crohn's disease, unspecified, with other complication: Secondary | ICD-10-CM | POA: Insufficient documentation

## 2023-11-22 DIAGNOSIS — L24B3 Irritant contact dermatitis related to fecal or urinary stoma or fistula: Secondary | ICD-10-CM

## 2023-11-22 DIAGNOSIS — R197 Diarrhea, unspecified: Secondary | ICD-10-CM | POA: Insufficient documentation

## 2023-11-22 DIAGNOSIS — Z432 Encounter for attention to ileostomy: Secondary | ICD-10-CM

## 2023-11-22 NOTE — Telephone Encounter (Signed)
 Called to confirm/remind patient of their appointment at the [facility] on 11/23/23.   Appointment:   [] Confirmed  [] Left mess   [] No answer/No voice mail  [] VM Full/unable to leave message  [] Phone not in service  Patient reminded to bring all medications and/or complete list.  Confirmed patient has transportation. Gave directions, instructed to utilize valet parking.

## 2023-11-22 NOTE — Progress Notes (Signed)
 [facility]   Reason for visit:  RLQ Ileostomy since 2008 HPI:  Crohn's disease with end ileostomy Past Medical History:  Diagnosis Date   Ascending Aortic Dilation    a. 09/2021 Echo: Asc Ao 40mm.   CAD (coronary artery disease)    a. 07/2021 Cath: LM nl, LAD 70m, D2 40, LCX 65p/m, OM1 90, RCA 13m w/ L-.R collats to RPDA from septal 1/2-->Med rx.   Chronic HFrEF (heart failure with reduced ejection fraction) (HCC)    a. 07/2021 Echo: EF 30-35%, glob HK, mod LVH, GrII DD, nl RV fxn, mildly dil LA, mild-mod MR; b. 09/2021 Echo: EF 35-40%, glob HK, mod LVH, GrI DD, nl RV fxn, mildly dil RA, mild MR, Asc Ao 40mm.   CKD (chronic kidney disease) stage 2, GFR 60-89 ml/min    Crohn's disease (HCC)    Hypertension    Mixed Ischemic & Nonischemic cardiomyopathy    a. 07/2021 Echo: EF 30-35%; b. 07/2021 Cath: Occluded RCA w/ mod LAD/LCX dzs, and severe OM1 dzs-->Med Rx; c. 09/2021 Echo: EF 35-40%.   PAH (pulmonary artery hypertension) (HCC)    a. 07/2021 RHC: PA 67/30 (42).   Proteinuria    Family History  Problem Relation Age of Onset   Hypertension Mother    Cancer Maternal Aunt    Heart disease Maternal Uncle    Pancreatic cancer Maternal Uncle    Allergies  Allergen Reactions   Ceftriaxone  Rash    Macular rash with itchness   Ibuprofen Other (See Comments)    History of GIB with ibuprofen    Sulfacetamide Other (See Comments)   Neomycin-Bacitracin Zn-Polymyx     Other reaction(s): Other (See Comments)   Spironolactone      Hyperkalemia   Current Outpatient Medications  Medication Sig Dispense Refill Last Dose/Taking   atorvastatin  (LIPITOR ) 80 MG tablet Take 1 tablet (80 mg total) by mouth every evening. 90 tablet 3    carvedilol  (COREG ) 3.125 MG tablet Take 1 tablet (3.125 mg total) by mouth 2 (two) times daily. 60 tablet 0    empagliflozin  (JARDIANCE ) 10 MG TABS tablet Take 10 mg by mouth daily.      famotidine  (PEPCID ) 20 MG tablet Take 20 mg by mouth 2 (two) times  daily.      furosemide  (LASIX ) 20 MG tablet Take 1 tablet (20 mg total) by mouth daily as needed. For weight gain of 3 lb in 24 hours or 5 lbs in one week 90 tablet 1    isosorbide  mononitrate (IMDUR ) 60 MG 24 hr tablet Take 1 tablet (60 mg total) by mouth daily. 30 tablet 0    pantoprazole  (PROTONIX ) 40 MG tablet Take 1 tablet (40 mg total) by mouth daily. 30 tablet 0    ranolazine  (RANEXA ) 500 MG 12 hr tablet Take 500 mg by mouth 2 (two) times daily.      sacubitril -valsartan  (ENTRESTO ) 24-26 MG Take 1 tablet by mouth 2 (two) times daily. 60 tablet 0    No current facility-administered medications for this encounter.   ROS  Review of Systems Vital signs:  BP 135/83 (BP Location: Right Arm)   Pulse 84   Temp 98.5 F (36.9 C) (Oral)   Resp 19   SpO2 96%  Exam:  Physical Exam  Stoma type/location:  RLQ ileostomy Stomal assessment/size:  1 1/4  Peristomal assessment:  granulomas to mucosa from 5 to 11 o'clock Parastomal hernia causing bulging circumferentially.  Treatment options for stomal/peristomal skin: Wears convatec 1 piece  flatpouches.  Notes the adhesive slips and pouch loses seal.  Would like to try coloplast Mio Flip pouch designed for hernias and bulges.  It has a tapeless adhesive backing that may improve the slipping he is feeling.  A hernia belt connected to pouch may improve wear time as well.   Output: soft brown stool  Ostomy pouching: 1pc. Coloplast convex  stoma powder and skin prep barrier ring  Recommend ostomy belt.  Education provided:  I apply new tapeless pouch designed for bulges and hernias.  This conforms to his abdomen better and may improve wear time.  He loses seal usually on side from 12 to 6 o'clock, will add barrier strips to perimeter.  Today, we will apply silver nitrate to the granulomas to discourage growth and slow bleeding from these areas.     Impression/dx  Ileostomy Irritant contact dermatitis Discussion  New pouching  recommendations Silver nitrate to granulomas Stoma powder and skin prep to peristomal irritation.  Plan  Patient insurance (Medicare and Medicaid) will be in effect in 2 months.  I will set him up with [employer] to assist with supplies.  For now, he uses [employer]administrative [employer] in [place].      Visit time: 45 minutes.   Darice Cooley [name]-BC

## 2023-11-22 NOTE — Progress Notes (Unsigned)
 [facility] FOLLOW UP CLINIC NOTE  Referring Physician: Leonarda Roxan BROCKS, NP  Primary Care: Ngetich, Roxan BROCKS, NP Primary Cardiologist:  HPI: Clarence [name] is a 53 y.o. male with a PMH of chronic systolic HF, Crohn's disease, CKD, HTN who presents for follow up of chronic heart failure with mildly reduced EF.       Patient was admitted in 3/23 with newly found cardiomyopathy, CHF and atypical PNA.  Echo in 3/23 showed EF 30-35%, moderate LVH, normal RV. LHC showed occluded RCA with collaterals, 60% mLAD, 65% mLCx, 90% mid OM1.  RHC showed elevated filling pressures and low CI (2).  Patient was admitted again in 4/23 with CHF. He was admitted in 5/23 with hypertensive urgency and chest pain, CTA abdomen/pelvis did not show renal artery stenosis. He was admitted again in 6/23 with hypertensive urgency.  He quit smoking after 3/23 admission.  Rarely drinks ETOH and no drugs.  No strong family history of CAD or cardiomyopathy.     Sleep study was done, not suggestive of OSA.     Patient continued to have chest pain episodes so LHC/RHC was done in 5/24 showing occluded mRCA with collaterals, 90% stenosis with severe diffuse disease in OM1. 50% mLAD, 60% proximal and mid LCx (unchanged from prior, no interventional target); normal filling pressures and preserved cardiac output.    Patient had progressive angina with repeat cath in 10/24.  This showed similar severe diffuse disease in the OM1, he had DES to OM1.  Other disease unchanged, CTO of RCA, 50% mid LAD.      SUBJECTIVE:  Recently admitted with diarrhea, high ostomy output. Had significant AKI on CKD, improved with fluids. Complaining of the same today, never did get in to see GI. He does admit to feeling lightheaded and dizzy and has had trouble keeping up with ostomy output. Denies any recent or active chest pain. He is feeling somewhat better than he did prior to his ED visit.   PMH, current medications, allergies,  social history, and family history reviewed in epic.  PHYSICAL EXAM: There were no vitals filed for this visit.  GENERAL: Well nourished and in no apparent distress at rest.  PULM:  Normal work of breathing, clear to auscultation bilaterally. Respirations are unlabored.  CARDIAC:  JVP: flat         Normal rate with regular rhythm. No murmurs, rubs or gallops.  No edema. Warm and well perfused extremities. ABDOMEN: soft, mildly tender, ostomy in place. NEUROLOGIC: Patient is oriented x3 with no focal or lateralizing neurologic deficits.    DATA REVIEW   ECHO:  - Echo (3/23): EF 30-35%, moderate LVH, normal RV. - RHC (3/23): mean RA 16, PA 67/30 mean 42, mean PCWP 25, CI 2, PVR 4 - Echo (5/23): EF 35-40%, global HK, moderate LVH, RV normal, mild MR - Echo (4/24): EF 45-50%, inferior and inferolateral hypokinesis, normal RV.    CATH: LHC (10/24): Occluded mRCA with collaterals, 90% long/diffuse stenosis in OM1, 50% mLAD. He was treated with DES to OM1.    ASSESSMENT & PLAN:  Chronic heart failure with mid range EF: Well compensated from a  HF standpoint, unfortunately majority of symptoms relate to known Crohn's. Prior colectomy plus ostomy, awaiting GI follow up. Given low BP (though improved on recheck, recent admission for hypovolemia, and ongoing diarrhea will hold entresto  today. - Stop entresto  24/26mg  BID - Follow up with APP in 1 month, if improved can restart - BMP today -  Continue jardiance  10mg  daily, coreg  3.125mg  BID< - Holding spironolactone  given AKI - Continue lasix  20mg  prn, eu-hypovolemic today  CAD: No anginal symptoms today, has been a chronic complaint. - Continue imdur  60mg  daily, ASA 81mg  daily and plavix  75mg  daily - Continue atorvastatin , praluent , and zetia  as well as fenofibrate   CKD Stage IIIB: Recent admission with creatinine peak of 3.6, improved with fluids. - Stop entresto  as above given ongoing volume loss - Repeat labs today - Continue  jardiance  10mg  daily  Crohn's disease:  - Ongoing diarrhea, GI follow up  Follow up in 1 month with APP then Ezra Rolan Morene Zenaida, MD Advanced [facility] Mechanical Circulatory Support 11/22/23

## 2023-11-23 ENCOUNTER — Ambulatory Visit: Payer: Self-pay | Admitting: Family

## 2023-11-23 ENCOUNTER — Ambulatory Visit (HOSPITAL_BASED_OUTPATIENT_CLINIC_OR_DEPARTMENT_OTHER): Payer: Self-pay | Admitting: Family

## 2023-11-23 ENCOUNTER — Other Ambulatory Visit
Admission: RE | Admit: 2023-11-23 | Discharge: 2023-11-23 | Disposition: A | Discharge status: Home / Self Care | Payer: Self-pay | Source: Ambulatory Visit | Attending: Family | Admitting: Family

## 2023-11-23 ENCOUNTER — Encounter: Payer: Self-pay | Admitting: Family

## 2023-11-23 VITALS — BP 137/96 | HR 80 | Wt 242.0 lb

## 2023-11-23 DIAGNOSIS — K50918 Crohn's disease, unspecified, with other complication: Secondary | ICD-10-CM

## 2023-11-23 DIAGNOSIS — I5022 Chronic systolic (congestive) heart failure: Secondary | ICD-10-CM | POA: Insufficient documentation

## 2023-11-23 DIAGNOSIS — I25118 Atherosclerotic heart disease of native coronary artery with other forms of angina pectoris: Secondary | ICD-10-CM

## 2023-11-23 DIAGNOSIS — N183 Chronic kidney disease, stage 3 unspecified: Secondary | ICD-10-CM

## 2023-11-23 LAB — BASIC METABOLIC PANEL WITH GFR
Anion gap: 11 (ref 5–15)
BUN: 16 mg/dL (ref 6–20)
CO2: 25 mmol/L (ref 22–32)
Calcium: 9.5 mg/dL (ref 8.9–10.3)
Chloride: 104 mmol/L (ref 98–111)
Creatinine, Ser: 1.25 mg/dL — ABNORMAL HIGH (ref 0.61–1.24)
GFR, Estimated: >60 mL/min (ref >60)
Glucose, Bld: 169 mg/dL — ABNORMAL HIGH (ref 70–99)
Potassium: 3.8 mmol/L (ref 3.5–5.1)
Sodium: 140 mmol/L (ref 135–145)

## 2023-11-23 NOTE — Patient Instructions (Signed)
 Medication Changes:  No medication changes today!  Lab Work:  Go over to the [facility]. Go pass the gift shop and have your blood work completed.  We will only call you if the results are abnormal or if the provider would like to make medication changes.   Do the following things EVERYDAY: Weigh yourself in the morning before breakfast. Write it down and keep it in a log. Take your medicines as prescribed Eat low salt foods--Limit salt (sodium) to 2000 mg per day.  Stay as active as you can everyday Limit all fluids for the day to less than 2 liters     Follow-Up in: Please follow up with the [facility] in 4 weeks with [name], [name].  At the [facility], you and your health needs are our priority. We have a designated team specialized in the treatment of Heart Failure. This Care Team includes your primary Heart Failure Specialized Cardiologist (physician), Advanced Practice Providers (APPs- Physician Assistants and Nurse Practitioners), and Pharmacist who all work together to provide you with the care you need, when you need it.   You may see any of the following providers on your designated Care Team at your next follow up:  Dr. Toribio Fuel Dr. Ezra Shuck Dr. Ria Commander Dr. Odis Brownie [name], [name] Jaun Bash, RPH-CPP  Please be sure to bring in all your medications bottles to every appointment.   Need to Contact Us :  If you have any questions or concerns before your next appointment please send us  a message through [place] or call our office at [phone].    TO LEAVE A MESSAGE FOR THE NURSE SELECT OPTION 2, PLEASE LEAVE A MESSAGE INCLUDING: YOUR NAME DATE OF BIRTH CALL BACK NUMBER REASON FOR CALL**this is important as we prioritize the call backs  YOU WILL RECEIVE A CALL BACK THE SAME DAY AS LONG AS YOU CALL BEFORE 4:00 PM

## 2023-11-23 NOTE — Telephone Encounter (Signed)
 Called and left patient a detailed vm letting him know that Alan has ordered additional lab work and an H. Pylori stool test. I advised patient that he can stop by at his earliest convenience to have labs drawn, no appt needed, and he will need to stop by the 2nd floor receptionist desk to pick up the stool kit. I provided patient with lab hours and location.   MyChart message sent to patient as well.

## 2023-11-24 ENCOUNTER — Other Ambulatory Visit (HOSPITAL_COMMUNITY): Payer: Self-pay | Admitting: Nurse Practitioner

## 2023-11-24 DIAGNOSIS — K4020 Bilateral inguinal hernia, without obstruction or gangrene, not specified as recurrent: Secondary | ICD-10-CM

## 2023-11-24 DIAGNOSIS — Z432 Encounter for attention to ileostomy: Secondary | ICD-10-CM

## 2023-11-24 DIAGNOSIS — L980 Pyogenic granuloma: Secondary | ICD-10-CM

## 2023-11-24 DIAGNOSIS — L24B3 Irritant contact dermatitis related to fecal or urinary stoma or fistula: Secondary | ICD-10-CM | POA: Insufficient documentation

## 2023-11-24 NOTE — Discharge Instructions (Signed)
Will set up with [facility] 

## 2023-11-29 ENCOUNTER — Ambulatory Visit (HOSPITAL_COMMUNITY)
Admission: RE | Admit: 2023-11-29 | Discharge: 2023-11-29 | Disposition: A | Discharge status: Home / Self Care | Payer: Self-pay | Source: Ambulatory Visit | Attending: Nurse Practitioner | Admitting: Nurse Practitioner

## 2023-11-29 DIAGNOSIS — Z432 Encounter for attention to ileostomy: Secondary | ICD-10-CM

## 2023-11-29 DIAGNOSIS — L24B3 Irritant contact dermatitis related to fecal or urinary stoma or fistula: Secondary | ICD-10-CM | POA: Diagnosis not present

## 2023-11-29 NOTE — Progress Notes (Signed)
 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 09/2021 Echo: Asc Ao 40mm.   CAD (coronary artery disease)    a. 07/2021 Cath: LM nl, LAD 89m, D2 40, LCX 65p/m, OM1 90, RCA 184m w/ L-.R collats to RPDA from septal 1/2-->Med rx.   Chronic HFrEF (heart failure with reduced ejection fraction) (HCC)    a. 07/2021 Echo: EF 30-35%, glob HK, mod LVH, GrII DD, nl RV fxn, mildly dil LA, mild-mod MR; b. 09/2021 Echo: EF 35-40%, glob HK, mod LVH, GrI DD, nl RV fxn, mildly dil RA, mild MR, Asc Ao 40mm.   CKD (chronic kidney disease) stage 2, GFR 60-89 ml/min    Crohn's disease (HCC)    Hypertension    Mixed Ischemic & Nonischemic cardiomyopathy    a. 07/2021 Echo: EF 30-35%; b. 07/2021 Cath: Occluded RCA w/ mod LAD/LCX dzs, and severe OM1 dzs-->Med Rx; c. 09/2021 Echo: EF 35-40%.   PAH (pulmonary artery hypertension) (HCC)    a. 07/2021 RHC: PA 67/30 (42).   Proteinuria    Family History  Problem Relation Age of Onset   Hypertension Mother    Cancer Maternal Aunt    Heart disease Maternal Uncle    Pancreatic cancer Maternal Uncle    Allergies  Allergen Reactions   Ceftriaxone  Rash    Macular rash with itchness   Ibuprofen Other (See Comments)    History of GIB with ibuprofen    Sulfacetamide Other (See Comments)   Neomycin-Bacitracin Zn-Polymyx     Other reaction(s): Other (See Comments)   Spironolactone      Hyperkalemia   Current Outpatient Medications  Medication Sig Dispense Refill Last Dose/Taking   aspirin  EC 81 MG tablet Take 81 mg by mouth daily. Swallow whole. (Patient taking differently: Take 81 mg by mouth every 6 (six) hours as needed for mild pain (pain score 1-3). Swallow whole.)      atorvastatin  (LIPITOR ) 80 MG tablet Take 1 tablet (80 mg total) by mouth every evening. (Patient not taking: Reported on 11/23/2023) 90 tablet 3     carvedilol  (COREG ) 3.125 MG tablet Take 1 tablet (3.125 mg total) by mouth 2 (two) times daily. 60 tablet 0    empagliflozin  (JARDIANCE ) 10 MG TABS tablet Take 10 mg by mouth daily.      famotidine  (PEPCID ) 20 MG tablet Take 20 mg by mouth 2 (two) times daily.      furosemide  (LASIX ) 20 MG tablet Take 1 tablet (20 mg total) by mouth daily as needed. For weight gain of 3 lb in 24 hours or 5 lbs in one week 90 tablet 1    isosorbide  mononitrate (IMDUR ) 60 MG 24 hr tablet Take 1 tablet (60 mg total) by mouth daily. 30 tablet 0    pantoprazole  (PROTONIX ) 40 MG tablet Take 1 tablet (40 mg total) by mouth daily. (Patient not taking: Reported on 11/23/2023) 30 tablet 0    ranolazine  (RANEXA ) 500 MG 12 hr tablet Take 500 mg by mouth 2 (two) times daily.      sacubitril -valsartan  (ENTRESTO ) 24-26 MG Take 1 tablet by mouth 2 (two) times daily. 60 tablet 0    No current facility-administered medications for this encounter.   ROS  Review of Systems  Respiratory:  Positive for shortness of breath (History CHF).   Cardiovascular:  CHF  Gastrointestinal:        Ileostomy Crohn's disease  Skin:  Positive for color change.  Psychiatric/Behavioral: Negative.     Vital signs:  BP (!) 139/93 (BP Location: Right Arm)   Pulse 83   Temp 98 F (36.7 C) (Oral)   Resp 19   SpO2 96%  Exam:  Physical Exam Constitutional:      Appearance: Normal appearance.  HENT:     Mouth/Throat:     Mouth: Mucous membranes are moist.  Cardiovascular:     Rate and Rhythm: Normal rate.     Comments: Hx CHF Pulmonary:     Breath sounds: Normal breath sounds.  Abdominal:     Palpations: Abdomen is soft.     Hernia: A hernia is present.  Musculoskeletal:        General: Normal range of motion.  Skin:    General: Skin is warm and dry.     Findings: Erythema present.  Neurological:     General: No focal deficit present.     Mental Status: He is alert and oriented to person, place, and time.  Psychiatric:         Mood and Affect: Mood normal.        Behavior: Behavior normal.     Stoma type/location:  RLQ ileostomy Stomal assessment/size:  1 1/4 pink and moist  Peristomal assessment:  granulomas to mucosa, parastomal hernia Treatment options for stomal/peristomal skin: Mio flip has been working great.  Has worn this pouch since last visit. He is very pleased and would like to remain in this pouch.  I will set up with [facility], he understands that it will be out of pocket until his Medicaid becomes active, which he thinks is in September.   Output: liquid brown stool  Ostomy pouching: 1pc.Mio Flip coloplast with stoma powder/skin prep/barrier ring  ostomy belt  Barrier strips to perimeter of pouch to hold secure.  Education provided:  He would like to stay in this pouch.. Samples provided today.     Impression/dx  Irritant contact dermatitis ileostomy  Discussion  See above.  Plan  Silver nitrate applied to granulomas again today.  No bleeding noted today with pouch changes    Visit time: 55 minutes.   Darice Cooley [name]-BC

## 2023-11-30 ENCOUNTER — Other Ambulatory Visit (HOSPITAL_COMMUNITY): Payer: Self-pay | Admitting: Nurse Practitioner

## 2023-11-30 DIAGNOSIS — L24B3 Irritant contact dermatitis related to fecal or urinary stoma or fistula: Secondary | ICD-10-CM

## 2023-11-30 DIAGNOSIS — Z432 Encounter for attention to ileostomy: Secondary | ICD-10-CM

## 2023-11-30 DIAGNOSIS — L980 Pyogenic granuloma: Secondary | ICD-10-CM

## 2023-11-30 DIAGNOSIS — K4020 Bilateral inguinal hernia, without obstruction or gangrene, not specified as recurrent: Secondary | ICD-10-CM

## 2023-12-02 DIAGNOSIS — K4020 Bilateral inguinal hernia, without obstruction or gangrene, not specified as recurrent: Secondary | ICD-10-CM | POA: Insufficient documentation

## 2023-12-02 NOTE — Discharge Instructions (Signed)
 Continue coloplast pouch Will set up with [facility].   See back for ongoing treatment granulomas

## 2023-12-07 ENCOUNTER — Other Ambulatory Visit: Payer: Self-pay

## 2023-12-07 MED ORDER — CLOPIDOGREL BISULFATE 75 MG PO TABS
75.0000 mg | ORAL_TABLET | Freq: Every day | ORAL | 11 refills | Status: AC
  Filled 2023-12-07: qty 30, 30d supply, fill #0
  Filled 2024-01-11: qty 30, 30d supply, fill #1
  Filled 2024-03-06: qty 30, 30d supply, fill #2
  Filled 2024-04-23: qty 30, 30d supply, fill #3
  Filled 2024-06-13: qty 30, 30d supply, fill #4

## 2023-12-13 ENCOUNTER — Ambulatory Visit (HOSPITAL_COMMUNITY)
Admission: RE | Admit: 2023-12-13 | Discharge: 2023-12-13 | Disposition: A | Discharge status: Home / Self Care | Payer: Self-pay | Source: Ambulatory Visit | Attending: Nurse Practitioner | Admitting: Nurse Practitioner

## 2023-12-13 DIAGNOSIS — K435 Parastomal hernia without obstruction or  gangrene: Secondary | ICD-10-CM

## 2023-12-13 DIAGNOSIS — Z432 Encounter for attention to ileostomy: Secondary | ICD-10-CM

## 2023-12-13 NOTE — Progress Notes (Signed)
  [facility]   Reason for visit:  RLQ ileostomy with parastomal hernia HPI:  Crohn's disease with ileostomy  Past Medical History:  Diagnosis Date   Ascending Aortic Dilation    a. 09/2021 Echo: Asc Ao 40mm.   CAD (coronary artery disease)    a. 07/2021 Cath: LM nl, LAD 67m, D2 40, LCX 65p/m, OM1 90, RCA 190m w/ L-.R collats to RPDA from septal 1/2-->Med rx.   Chronic HFrEF (heart failure with reduced ejection fraction) (HCC)    a. 07/2021 Echo: EF 30-35%, glob HK, mod LVH, GrII DD, nl RV fxn, mildly dil LA, mild-mod MR; b. 09/2021 Echo: EF 35-40%, glob HK, mod LVH, GrI DD, nl RV fxn, mildly dil RA, mild MR, Asc Ao 40mm.   CKD (chronic kidney disease) stage 2, GFR 60-89 ml/min    Crohn's disease (HCC)    Hypertension    Mixed Ischemic & Nonischemic cardiomyopathy    a. 07/2021 Echo: EF 30-35%; b. 07/2021 Cath: Occluded RCA w/ mod LAD/LCX dzs, and severe OM1 dzs-->Med Rx; c. 09/2021 Echo: EF 35-40%.   PAH (pulmonary artery hypertension) (HCC)    a. 07/2021 RHC: PA 67/30 (42).   Proteinuria    Family History  Problem Relation Age of Onset   Hypertension Mother    Cancer Maternal Aunt    Heart disease Maternal Uncle    Pancreatic cancer Maternal Uncle    Allergies  Allergen Reactions   Ceftriaxone  Rash    Macular rash with itchness   Ibuprofen Other (See Comments)    History of GIB with ibuprofen    Sulfacetamide Other (See Comments)   Neomycin-Bacitracin Zn-Polymyx     Other reaction(s): Other (See Comments)   Spironolactone      Hyperkalemia   Current Outpatient Medications  Medication Sig Dispense Refill Last Dose/Taking   aspirin  EC 81 MG tablet Take 81 mg by mouth daily. Swallow whole. (Patient taking differently: Take 81 mg by mouth every 6 (six) hours as needed for mild pain (pain score 1-3). Swallow whole.)      atorvastatin  (LIPITOR ) 80 MG tablet Take 1 tablet (80 mg total) by mouth every evening. (Patient not taking: Reported on 11/23/2023) 90 tablet 3     carvedilol  (COREG ) 3.125 MG tablet Take 1 tablet (3.125 mg total) by mouth 2 (two) times daily. 60 tablet 0    clopidogrel  (PLAVIX ) 75 MG tablet Take 1 tablet (75 mg total) by mouth daily. 30 tablet 11    empagliflozin  (JARDIANCE ) 10 MG TABS tablet Take 10 mg by mouth daily.      famotidine  (PEPCID ) 20 MG tablet Take 20 mg by mouth 2 (two) times daily.      furosemide  (LASIX ) 20 MG tablet Take 1 tablet (20 mg total) by mouth daily as needed. For weight gain of 3 lb in 24 hours or 5 lbs in one week 90 tablet 1    isosorbide  mononitrate (IMDUR ) 60 MG 24 hr tablet Take 1 tablet (60 mg total) by mouth daily. 30 tablet 0    pantoprazole  (PROTONIX ) 40 MG tablet Take 1 tablet (40 mg total) by mouth daily. (Patient not taking: Reported on 11/23/2023) 30 tablet 0    ranolazine  (RANEXA ) 500 MG 12 hr tablet Take 500 mg by mouth 2 (two) times daily.      No current facility-administered medications for this encounter.   ROS  Review of Systems  Respiratory:  Positive for shortness of breath.        Heart failure. Shortness  of breath with activity .   Cardiovascular:        Heart failure  Gastrointestinal:        RLQ ileostomy  Skin:  Positive for color change.       Peristomal irritation, improving   Psychiatric/Behavioral: Negative.    All other systems reviewed and are negative.  Vital signs:  There were no vitals taken for this visit. Exam:  Physical Exam Constitutional:      Appearance: Normal appearance.  HENT:     Mouth/Throat:     Mouth: Mucous membranes are moist.  Pulmonary:     Comments: Becomes short of breath with walking.  Abdominal:     Palpations: Abdomen is soft.     Hernia: A hernia is present.  Musculoskeletal:        General: Normal range of motion.  Skin:    General: Skin is warm and dry.  Neurological:     Mental Status: He is alert and oriented to person, place, and time. Mental status is at baseline.  Psychiatric:        Mood and Affect: Mood normal.         Behavior: Behavior normal.     Stoma type/location:  RLQ end ileostomy Stomal assessment/size:  1 1/4 budded Peristomal assessment:  peristomal granulomas, smaller today.  No bleeding.  Peristomal breakdown and irritation has improved.   Treatment options for stomal/peristomal skin: stoma powder and skin prep  barrier ring and Mio flip curved pouch for hernia and bulges.  Output: liquid brown stool Ostomy pouching: 1pc.Mio flip  Education provided:  pouch change performed.  No bleeding from granulomas.  No silver nitrate applied today.     Impression/dx  Ileostomy Parastomal hernia Discussion  See back as needed.  Pouch continues to do well.  Plan  See back as needed.  Prescription sent to [facility] for supplies.      Visit time: 45 minutes.   Darice Cooley [name]-BC

## 2023-12-19 NOTE — Discharge Instructions (Signed)
 Continue Mio Flip!  You are doing great

## 2023-12-21 ENCOUNTER — Telehealth: Payer: Self-pay | Admitting: Family

## 2023-12-21 NOTE — Telephone Encounter (Signed)
 Called to confirm/remind patient of their appointment at the [facility] on 12/22/23.   Appointment:   [] Confirmed  [x] Left mess   [] No answer/No voice mail  [] VM Full/unable to leave message  [] Phone not in service  Patient reminded to bring all medications and/or complete list.  Confirmed patient has transportation. Gave directions, instructed to utilize valet parking.

## 2023-12-21 NOTE — Progress Notes (Unsigned)
 ADVANCED HE[facility] FAILURE FOLLOW UP CLINIC NOTE  Referring Physician: Leonarda Roxan BROCKS, NP  Primary Care: Ngetich, Roxan BROCKS, NP Primary Cardiologist:   HPI:  Clarence [name] is a 53 y.o. male with a PMH of chronic systolic HF, Crohn's disease, CKD, HTN who presents for follow up of chronic heart failure with mildly reduced EF.   Patient was admitted in 3/23 with newly found cardiomyopathy, CHF and atypical PNA.  Echo in 3/23 showed EF 30-35%, moderate LVH, normal RV. LHC showed occluded RCA with collaterals, 60% mLAD, 65% mLCx, 90% mid OM1.  RHC showed elevated filling pressures and low CI (2).  Patient was admitted again in 4/23 with CHF. He was admitted in 5/23 with hypertensive urgency and chest pain, CTA abdomen/pelvis did not show renal artery stenosis. He was admitted again in 6/23 with hypertensive urgency.  He quit smoking after 3/23 admission.  Rarely drinks ETOH and no drugs.  No strong family history of CAD or cardiomyopathy.     Sleep study was done, not suggestive of OSA.    Patient continued to have chest pain episodes so LHC/RHC was done in 5/24 showing occluded mRCA with collaterals, 90% stenosis with severe diffuse disease in OM1. 50% mLAD, 60% proximal and mid LCx (unchanged from prior, no interventional target); normal filling pressures and preserved cardiac output.    Patient had progressive angina with repeat cath in 10/24.  This showed similar severe diffuse disease in the OM1, he had DES to OM1.  Other disease unchanged, CTO of RCA, 50% mid LAD.   Echo 07/22/23: EF 45-50%, G1DD, normal RV, normal PA pressure, mild MR  Admitted 10/18/23 with n/v & increasing ostomy output. GI PCR negative, C. difficile negative. IVF given.    Seen in [facility] 06/25 where entresto  was stopped due to ongoing diarrhea & recent admission due to hypovolemic.   Admitted 11/11/23 with 24 hours of chest pain along with uncontrolled HTN. Troponins trended and were flat. Entresto  24/26mg  BID was  reintroduced.    SUBJECTIVE:  He presents today for a hospital follow-up visit with a chief complaint of shortness of breath. Has associated minimal chest pain (better with entresto  resumption), occasional palpitations, trace pedal edema  Has been back on entresto  for ~ 1 week. Ostomy output is stable.  PMH, current medications, allergies, social history, and family history reviewed in epic.  ROS: All systems negative except what is listed in HPI, PMH and Problem List   PHYSICAL EXAM:  General: Well appearing. No resp difficulty HEENT: normal Neck: supple, no JVD Cor: Regular rhythm, rate. No rubs, gallops or murmurs Lungs: clear Abdomen: soft, nontender, nondistended, ostomy intact Extremities: no cyanosis, clubbing, rash, edema Neuro: alert & oriented X 3. Moves all 4 extremities w/o difficulty. Affect pleasant   There were no vitals filed for this visit.  Wt Readings from Last 3 Encounters:  11/23/23 242 lb (109.8 kg)  11/11/23 240 lb (108.9 kg)  11/08/23 241 lb (109.3 kg)   Lab Results  Component Value Date   CREATININE 1.25 (H) 11/23/2023   CREATININE 1.13 11/12/2023   CREATININE 1.38 (H) 11/11/2023    DATA REVIEW  ECHO:  - Echo (3/23): EF 30-35%, moderate LVH, normal RV. - RHC (3/23): mean RA 16, PA 67/30 mean 42, mean PCWP 25, CI 2, PVR 4 - Echo (5/23): EF 35-40%, global HK, moderate LVH, RV normal, mild MR - Echo (4/24): EF 45-50%, inferior and inferolateral hypokinesis, normal RV.   - Echo 07/22/23: EF 45-50%,  G1DD, normal RV, normal PA pressure, mild MR  CATH: LHC (10/24): Occluded mRCA with collaterals, 90% long/diffuse stenosis in OM1, 50% mLAD. He was treated with DES to OM1.    ASSESSMENT & PLAN:  1: Chronic heart failure with mid range EF: Well compensated from a  HF standpoint, unfortunately majority of symptoms relate to known Crohn's. Prior colectomy plus ostomy.  - NYHA class II - euvolemic today - Continue entresto  24/26mg  BID; just resumed  this ~ 1 week ago & stool consistency has not changed - Continue jardiance  10mg  daily.  - Continue coreg  3.125mg  BID - Continue lasix  20mg  prn - developed hyperkalemia with previous spironolactone  use - check BP daily, write readings down and bring log to next visit - BNP 11/11/23 was 74.7  2: CAD: No anginal symptoms today, has been a chronic complaint. - Continue imdur  60mg  daily, ASA 81mg  daily  - Continue atorvastatin , clopidogrel   3: CKD Stage IIIB:  - BMET 11/12/23 reviewed: sodium 145, potassium 3.8, creatinine 1.13 & GFR >60 - BMET today - Continue jardiance  10mg  daily  4: Crohn's disease:  - Prior colectomy plus ostomy.  - saw GI 06/25   Return in 3 weeks, sooner if needed.   [name], [place] 11/23/23

## 2023-12-22 ENCOUNTER — Other Ambulatory Visit
Admission: RE | Admit: 2023-12-22 | Discharge: 2023-12-22 | Disposition: A | Discharge status: Home / Self Care | Source: Ambulatory Visit | Attending: Family | Admitting: Family

## 2023-12-22 ENCOUNTER — Encounter: Payer: Self-pay | Admitting: Family

## 2023-12-22 ENCOUNTER — Ambulatory Visit: Admitting: Family

## 2023-12-22 ENCOUNTER — Other Ambulatory Visit: Payer: Self-pay | Admitting: Family

## 2023-12-22 ENCOUNTER — Ambulatory Visit: Payer: Self-pay | Admitting: Family

## 2023-12-22 ENCOUNTER — Other Ambulatory Visit: Payer: Self-pay

## 2023-12-22 VITALS — BP 138/100 | HR 79 | Wt 239.8 lb

## 2023-12-22 DIAGNOSIS — K50918 Crohn's disease, unspecified, with other complication: Secondary | ICD-10-CM

## 2023-12-22 DIAGNOSIS — I5022 Chronic systolic (congestive) heart failure: Secondary | ICD-10-CM

## 2023-12-22 DIAGNOSIS — I25118 Atherosclerotic heart disease of native coronary artery with other forms of angina pectoris: Secondary | ICD-10-CM

## 2023-12-22 DIAGNOSIS — K5090 Crohn's disease, unspecified, without complications: Secondary | ICD-10-CM | POA: Diagnosis not present

## 2023-12-22 DIAGNOSIS — I130 Hypertensive heart and chronic kidney disease with heart failure and stage 1 through stage 4 chronic kidney disease, or unspecified chronic kidney disease: Secondary | ICD-10-CM | POA: Diagnosis not present

## 2023-12-22 DIAGNOSIS — N183 Chronic kidney disease, stage 3 unspecified: Secondary | ICD-10-CM | POA: Diagnosis not present

## 2023-12-22 DIAGNOSIS — I2510 Atherosclerotic heart disease of native coronary artery without angina pectoris: Secondary | ICD-10-CM | POA: Insufficient documentation

## 2023-12-22 DIAGNOSIS — N1832 Chronic kidney disease, stage 3b: Secondary | ICD-10-CM | POA: Diagnosis not present

## 2023-12-22 DIAGNOSIS — Z433 Encounter for attention to colostomy: Secondary | ICD-10-CM | POA: Diagnosis not present

## 2023-12-22 DIAGNOSIS — R0602 Shortness of breath: Secondary | ICD-10-CM | POA: Diagnosis present

## 2023-12-22 LAB — BASIC METABOLIC PANEL WITH GFR
Anion gap: 7 (ref 5–15)
BUN: 14 mg/dL (ref 6–20)
CO2: 23 mmol/L (ref 22–32)
Calcium: 9.3 mg/dL (ref 8.9–10.3)
Chloride: 109 mmol/L (ref 98–111)
Creatinine, Ser: 1.34 mg/dL — ABNORMAL HIGH (ref 0.61–1.24)
GFR, Estimated: >60 mL/min (ref >60)
Glucose, Bld: 125 mg/dL — ABNORMAL HIGH (ref 70–99)
Potassium: 3.9 mmol/L (ref 3.5–5.1)
Sodium: 139 mmol/L (ref 135–145)

## 2023-12-22 MED ORDER — SACUBITRIL-VALSARTAN 49-51 MG PO TABS
1.0000 | ORAL_TABLET | Freq: Two times a day (BID) | ORAL | 5 refills | Status: DC
  Filled 2023-12-22 (×2): qty 60, 30d supply, fill #0

## 2023-12-22 NOTE — Patient Instructions (Signed)
 Medication Changes:  Please take your Coreg  3.125 MG TWICE DAILY   Lab Work:  Go over to the [facility]. Go pass the gift shop and have your blood work completed.  We will only call you if the results are abnormal or if the provider would like to make medication changes.  No news is good news.   Follow-Up in: 1 month with [name], [name].   Thank you for choosing [facility].    At the [facility], you and your health needs are our priority. We have a designated team specialized in the treatment of Heart Failure. This Care Team includes your primary Heart Failure Specialized Cardiologist (physician), Advanced Practice Providers (APPs- Physician Assistants and Nurse Practitioners), and Pharmacist who all work together to provide you with the care you need, when you need it.   You may see any of the following providers on your designated Care Team at your next follow up:  Dr. Toribio Fuel Dr. Ezra Shuck Dr. Ria Commander Dr. Morene Brownie [name], [name] Jaun Bash, RPH-CPP  Please be sure to bring in all your medications bottles to every appointment.   Need to Contact Us :  If you have any questions or concerns before your next appointment please send us  a message through [place] or call our office at [phone].    TO LEAVE A MESSAGE FOR THE NURSE SELECT OPTION 2, PLEASE LEAVE A MESSAGE INCLUDING: YOUR NAME DATE OF BIRTH CALL BACK NUMBER REASON FOR CALL**this is important as we prioritize the call backs  YOU WILL RECEIVE A CALL BACK THE SAME DAY AS LONG AS YOU CALL BEFORE 4:00 PM

## 2023-12-27 ENCOUNTER — Ambulatory Visit (HOSPITAL_COMMUNITY)
Admission: RE | Admit: 2023-12-27 | Discharge: 2023-12-27 | Disposition: A | Discharge status: Home / Self Care | Source: Ambulatory Visit | Attending: Family | Admitting: Family

## 2023-12-27 DIAGNOSIS — Z432 Encounter for attention to ileostomy: Secondary | ICD-10-CM | POA: Diagnosis not present

## 2023-12-27 DIAGNOSIS — K435 Parastomal hernia without obstruction or  gangrene: Secondary | ICD-10-CM | POA: Diagnosis not present

## 2023-12-27 DIAGNOSIS — Z932 Ileostomy status: Secondary | ICD-10-CM | POA: Insufficient documentation

## 2023-12-27 NOTE — Progress Notes (Signed)
 [facility]   Reason for visit:  RLQ ileostomy with parastomal hernia HPI:  Crohn's disease with end ileostomy Past Medical History:  Diagnosis Date   Ascending Aortic Dilation    a. 09/2021 Echo: Asc Ao 40mm.   CAD (coronary artery disease)    a. 07/2021 Cath: LM nl, LAD 57m, D2 40, LCX 65p/m, OM1 90, RCA 149m w/ L-.R collats to RPDA from septal 1/2-->Med rx.   Chronic HFrEF (heart failure with reduced ejection fraction) (HCC)    a. 07/2021 Echo: EF 30-35%, glob HK, mod LVH, GrII DD, nl RV fxn, mildly dil LA, mild-mod MR; b. 09/2021 Echo: EF 35-40%, glob HK, mod LVH, GrI DD, nl RV fxn, mildly dil RA, mild MR, Asc Ao 40mm.   CKD (chronic kidney disease) stage 2, GFR 60-89 ml/min    Crohn's disease (HCC)    Hypertension    Mixed Ischemic & Nonischemic cardiomyopathy    a. 07/2021 Echo: EF 30-35%; b. 07/2021 Cath: Occluded RCA w/ mod LAD/LCX dzs, and severe OM1 dzs-->Med Rx; c. 09/2021 Echo: EF 35-40%.   PAH (pulmonary artery hypertension) (HCC)    a. 07/2021 RHC: PA 67/30 (42).   Proteinuria    Family History  Problem Relation Age of Onset   Hypertension Mother    Cancer Maternal Aunt    Heart disease Maternal Uncle    Pancreatic cancer Maternal Uncle    Allergies  Allergen Reactions   Ceftriaxone  Rash    Macular rash with itchness   Ibuprofen Other (See Comments)    History of GIB with ibuprofen    Sulfacetamide Other (See Comments)   Neomycin-Bacitracin Zn-Polymyx     Other reaction(s): Other (See Comments)   Spironolactone      Hyperkalemia   Current Outpatient Medications  Medication Sig Dispense Refill Last Dose/Taking   aspirin  EC 81 MG tablet Take 81 mg by mouth daily. Swallow whole. (Patient not taking: Reported on 12/22/2023)      atorvastatin  (LIPITOR ) 80 MG tablet Take 1 tablet (80 mg total) by mouth every evening. (Patient not taking: Reported on 12/22/2023) 90 tablet 3    carvedilol  (COREG ) 3.125 MG tablet Take 1 tablet (3.125 mg total) by mouth 2 (two)  times daily. (Patient taking differently: Take 6.25 mg by mouth daily.) 60 tablet 0    clopidogrel  (PLAVIX ) 75 MG tablet Take 1 tablet (75 mg total) by mouth daily. 30 tablet 11    empagliflozin  (JARDIANCE ) 10 MG TABS tablet Take 10 mg by mouth daily.      famotidine  (PEPCID ) 20 MG tablet Take 20 mg by mouth 2 (two) times daily.      furosemide  (LASIX ) 20 MG tablet Take 1 tablet (20 mg total) by mouth daily as needed. For weight gain of 3 lb in 24 hours or 5 lbs in one week 90 tablet 1    isosorbide  mononitrate (IMDUR ) 60 MG 24 hr tablet Take 60 mg by mouth. Take 1 tab every morning and 1/2 tab every evening      ranolazine  (RANEXA ) 500 MG 12 hr tablet Take 500 mg by mouth 2 (two) times daily.      sacubitril -valsartan  (ENTRESTO ) 24-26 MG Take 1 tablet by mouth 2 (two) times daily. 180 tablet 3    No current facility-administered medications for this encounter.   ROS  Review of Systems  Constitutional: Negative.   Respiratory:  Positive for shortness of breath.        Short of breath with activity  Cardiovascular:  CHF, currently stable  Gastrointestinal:        Crohn's disease Ileostomy  Skin:  Positive for color change.  Psychiatric/Behavioral: Negative.    All other systems reviewed and are negative.  Vital signs:  BP (!) 145/97 (BP Location: Right Arm)   Pulse 95   Temp 98.2 F (36.8 C) (Oral)   Resp 18   SpO2 95%  Exam:  Physical Exam Vitals reviewed.  Constitutional:      Appearance: Normal appearance.  HENT:     Mouth/Throat:     Mouth: Mucous membranes are moist.  Abdominal:     Hernia: A hernia is present.  Skin:    General: Skin is warm.  Neurological:     Mental Status: He is alert and oriented to person, place, and time. Mental status is at baseline.  Psychiatric:        Mood and Affect: Mood normal.        Behavior: Behavior normal.     Stoma type/location:  RLQ ileostomy Stomal assessment/size:  1 slightly budded Peristomal assessment:  skin  is intact, discolored due to years of leaking stool.  Now, pouch stays in place for a week! Treatment options for stomal/peristomal skin: barrier ring and Mio flip pouch for hernia/bulges Output: liquid brown stool Ostomy pouching: 1pc. Education provided:  continue same pouch .  Has adequate supply of pouches until Insurance begins in September     Impression/dx  Ileostomy Parastomal hernia Discussion  No changes.  Pouching stable Plan  See back as needed     Visit time: 40 minutes.   Darice Cooley [name]-BC

## 2023-12-28 ENCOUNTER — Other Ambulatory Visit: Payer: Self-pay

## 2023-12-28 ENCOUNTER — Other Ambulatory Visit: Payer: Self-pay | Admitting: Family

## 2023-12-28 MED ORDER — SACUBITRIL-VALSARTAN 24-26 MG PO TABS
1.0000 | ORAL_TABLET | Freq: Two times a day (BID) | ORAL | 3 refills | Status: DC
Start: 2023-12-28 — End: 2024-02-04
  Filled 2023-12-28: qty 180, 90d supply, fill #0

## 2023-12-30 ENCOUNTER — Emergency Department
Admission: EM | Admit: 2023-12-30 | Discharge: 2023-12-30 | Discharge status: Against Medical Advice | Attending: Emergency Medicine | Admitting: Emergency Medicine

## 2023-12-30 ENCOUNTER — Other Ambulatory Visit: Payer: Self-pay

## 2023-12-30 DIAGNOSIS — Z5321 Procedure and treatment not carried out due to patient leaving prior to being seen by health care provider: Secondary | ICD-10-CM | POA: Diagnosis not present

## 2023-12-30 DIAGNOSIS — I509 Heart failure, unspecified: Secondary | ICD-10-CM | POA: Diagnosis not present

## 2023-12-30 DIAGNOSIS — I959 Hypotension, unspecified: Secondary | ICD-10-CM | POA: Insufficient documentation

## 2023-12-30 LAB — COMPREHENSIVE METABOLIC PANEL WITH GFR
ALT: 30 U/L (ref 0–44)
AST: 24 U/L (ref 15–41)
Albumin: 4.1 g/dL (ref 3.5–5.0)
Alkaline Phosphatase: 80 U/L (ref 38–126)
Anion gap: 7 (ref 5–15)
BUN: 23 mg/dL — ABNORMAL HIGH (ref 6–20)
CO2: 22 mmol/L (ref 22–32)
Calcium: 9.9 mg/dL (ref 8.9–10.3)
Chloride: 110 mmol/L (ref 98–111)
Creatinine, Ser: 1.72 mg/dL — ABNORMAL HIGH (ref 0.61–1.24)
GFR, Estimated: 47 mL/min — ABNORMAL LOW (ref >60)
Glucose, Bld: 117 mg/dL — ABNORMAL HIGH (ref 70–99)
Potassium: 4.7 mmol/L (ref 3.5–5.1)
Sodium: 139 mmol/L (ref 135–145)
Total Bilirubin: 1.6 mg/dL — ABNORMAL HIGH (ref 0.0–1.2)
Total Protein: 7.6 g/dL (ref 6.5–8.1)

## 2023-12-30 LAB — CBC
HCT: 47 % (ref 39.0–52.0)
Hemoglobin: 15.7 g/dL (ref 13.0–17.0)
MCH: 27.9 pg (ref 26.0–34.0)
MCHC: 33.4 g/dL (ref 30.0–36.0)
MCV: 83.5 fL (ref 80.0–100.0)
Platelets: 320 K/uL (ref 150–400)
RBC: 5.63 MIL/uL (ref 4.22–5.81)
RDW: 15.6 % — ABNORMAL HIGH (ref 11.5–15.5)
WBC: 9.3 K/uL (ref 4.0–10.5)
nRBC: 0 % (ref 0.0–0.2)

## 2023-12-30 NOTE — ED Triage Notes (Addendum)
 Pt sts that he has a cardiac hx. Pt sts that he has been having hypotension for months. Pt sts that he does see the heart failure clinic.

## 2023-12-30 NOTE — ED Notes (Signed)
 Per registration the pt said he was feeling better and left

## 2023-12-30 NOTE — Discharge Instructions (Signed)
 No changes

## 2024-01-10 ENCOUNTER — Telehealth: Payer: Self-pay

## 2024-01-10 ENCOUNTER — Ambulatory Visit: Payer: Self-pay | Admitting: Physician Assistant

## 2024-01-10 ENCOUNTER — Other Ambulatory Visit: Payer: Self-pay

## 2024-01-10 ENCOUNTER — Other Ambulatory Visit (INDEPENDENT_AMBULATORY_CARE_PROVIDER_SITE_OTHER)

## 2024-01-10 ENCOUNTER — Encounter: Payer: Self-pay | Admitting: Physician Assistant

## 2024-01-10 VITALS — BP 130/88 | HR 97 | Ht 70.0 in | Wt 236.4 lb

## 2024-01-10 DIAGNOSIS — I509 Heart failure, unspecified: Secondary | ICD-10-CM

## 2024-01-10 DIAGNOSIS — K50918 Crohn's disease, unspecified, with other complication: Secondary | ICD-10-CM | POA: Diagnosis not present

## 2024-01-10 DIAGNOSIS — Z9889 Other specified postprocedural states: Secondary | ICD-10-CM

## 2024-01-10 DIAGNOSIS — Z9049 Acquired absence of other specified parts of digestive tract: Secondary | ICD-10-CM

## 2024-01-10 DIAGNOSIS — R110 Nausea: Secondary | ICD-10-CM | POA: Diagnosis not present

## 2024-01-10 DIAGNOSIS — I2510 Atherosclerotic heart disease of native coronary artery without angina pectoris: Secondary | ICD-10-CM

## 2024-01-10 DIAGNOSIS — I5022 Chronic systolic (congestive) heart failure: Secondary | ICD-10-CM

## 2024-01-10 DIAGNOSIS — Z932 Ileostomy status: Secondary | ICD-10-CM

## 2024-01-10 DIAGNOSIS — Z87891 Personal history of nicotine dependence: Secondary | ICD-10-CM

## 2024-01-10 DIAGNOSIS — Z959 Presence of cardiac and vascular implant and graft, unspecified: Secondary | ICD-10-CM

## 2024-01-10 LAB — COMPREHENSIVE METABOLIC PANEL WITH GFR
ALT: 22 U/L (ref 0–53)
AST: 18 U/L (ref 0–37)
Albumin: 4.1 g/dL (ref 3.5–5.2)
Alkaline Phosphatase: 97 U/L (ref 39–117)
BUN: 12 mg/dL (ref 6–23)
CO2: 23 meq/L (ref 19–32)
Calcium: 9.4 mg/dL (ref 8.4–10.5)
Chloride: 110 meq/L (ref 96–112)
Creatinine, Ser: 1.37 mg/dL (ref 0.40–1.50)
GFR: 58.3 mL/min — ABNORMAL LOW (ref >60.00)
Glucose, Bld: 136 mg/dL — ABNORMAL HIGH (ref 70–99)
Potassium: 4.1 meq/L (ref 3.5–5.1)
Sodium: 143 meq/L (ref 135–145)
Total Bilirubin: 0.6 mg/dL (ref 0.2–1.2)
Total Protein: 7 g/dL (ref 6.0–8.3)

## 2024-01-10 LAB — CBC WITH DIFFERENTIAL/PLATELET
Basophils Absolute: 0 K/uL (ref 0.0–0.1)
Basophils Relative: 0.4 % (ref 0.0–3.0)
Eosinophils Absolute: 0.2 K/uL (ref 0.0–0.7)
Eosinophils Relative: 1.9 % (ref 0.0–5.0)
HCT: 44.9 % (ref 39.0–52.0)
Hemoglobin: 14.8 g/dL (ref 13.0–17.0)
Lymphocytes Relative: 17.6 % (ref 12.0–46.0)
Lymphs Abs: 1.5 K/uL (ref 0.7–4.0)
MCHC: 32.9 g/dL (ref 30.0–36.0)
MCV: 83.4 fl (ref 78.0–100.0)
Monocytes Absolute: 0.7 K/uL (ref 0.1–1.0)
Monocytes Relative: 8.7 % (ref 3.0–12.0)
Neutro Abs: 6 K/uL (ref 1.4–7.7)
Neutrophils Relative %: 71.4 % (ref 43.0–77.0)
Platelets: 276 K/uL (ref 150.0–400.0)
RBC: 5.38 Mil/uL (ref 4.22–5.81)
RDW: 15.8 % — ABNORMAL HIGH (ref 11.5–15.5)
WBC: 8.4 K/uL (ref 4.0–10.5)

## 2024-01-10 LAB — SEDIMENTATION RATE: Sed Rate: 12 mm/h (ref 0–20)

## 2024-01-10 MED ORDER — NA SULFATE-K SULFATE-MG SULF 17.5-3.13-1.6 GM/177ML PO SOLN
1.0000 | Freq: Once | ORAL | 0 refills | Status: AC
  Filled 2024-01-10: qty 354, 1d supply, fill #0

## 2024-01-10 NOTE — Patient Instructions (Addendum)
 You have been scheduled for an endoscopy and Ileostomy with Dr. Suzann. Please follow the written instructions given to you at your visit today.  If you use inhalers (even only as needed), please bring them with you on the day of your procedure.  DO NOT TAKE 7 DAYS PRIOR TO TEST- Trulicity (dulaglutide) Ozempic , Wegovy  (semaglutide ) Mounjaro (tirzepatide) Bydureon Bcise (exanatide extended release)  DO NOT TAKE 1 DAY PRIOR TO YOUR TEST Rybelsus  (semaglutide ) Adlyxin (lixisenatide) Victoza (liraglutide) Byetta (exanatide) ___________________________________________________________________________   Your provider has requested that you go to the basement level for lab work before leaving today. Press B on the elevator. The lab is located at the first door on the left as you exit the elevator.  - Can try loperamide  4 mg initially, then 2 mg after each unformed stool for =2 days, with a maximum of 16 mg/day.  - If loperamide  is not working, you could try bismuth salicylate (Pepto-Bismol) 30 mL or two tablets every 30 minutes for eight doses. Pepto-Bismol may make your stools black.   Go to the ER if any severe abdominal pain, fever, or weakness  FIBER SUPPLEMENT You can do metamucil or fibercon once or twice a day but if this causes gas/bloating please switch to Benefiber or Citracel.  Fiber is good for constipation/diarrhea/irritable bowel syndrome.  It can also help with weight loss and can help lower your bad cholesterol (LDL).  Please do 1 TBSP in the morning in water, coffee, or tea.  It can take up to a month before you can see a difference with your bowel movements.  It is cheapest from [employer], [employer], [employer].   Thank you for entrusting me with your care and for choosing [facility], Alan Coombs, P.A.-C   _______________________________________________________  If your blood pressure at your visit was 140/90 or greater, please contact your primary care  physician to follow up on this.  _______________________________________________________  If you are age 83 or older, your body mass index should be between 23-30. Your Body mass index is 33.92 kg/m. If this is out of the aforementioned range listed, please consider follow up with your Primary Care Provider.  If you are age 99 or younger, your body mass index should be between 19-25. Your Body mass index is 33.92 kg/m. If this is out of the aformentioned range listed, please consider follow up with your Primary Care Provider.   ________________________________________________________  The [facility] providers would like to encourage you to use MYCHART to communicate with providers for non-urgent requests or questions.  Due to long hold times on the telephone, sending your provider a message by [facility] may be a faster and more efficient way to get a response.  Please allow 48 business hours for a response.  Please remember that this is for non-urgent requests.  _______________________________________________________  [facility] is using a team-based approach to care.  Your team is made up of your doctor and two to three APPS. Our APPS (Nurse Practitioners and Physician Assistants) work with your physician to ensure care continuity for you. They are fully qualified to address your health concerns and develop a treatment plan. They communicate directly with your gastroenterologist to care for you. Seeing the Advanced Practice Practitioners on your physician's team can help you by facilitating care more promptly, often allowing for earlier appointments, access to diagnostic testing, procedures, and other specialty referrals.

## 2024-01-10 NOTE — Telephone Encounter (Signed)
  HAWTHORNE DAY 1970/09/13 [id number]  01/10/24    Dear [name], [name]  We have scheduled the above named patient for a(n) EGD/ILEOSCOPY procedure. Our records show that he is on anticoagulation therapy.  Please advise as to whether he can be given CARDIAC CLEARANCE AND if the patient may come off their therapy of PLAVIX  5 days prior to their procedure which is scheduled for 03-03-24.  Thank you.  Please route your response to Jentri Aye, CMA or fax response to [phone].  Sincerely,  [facility]

## 2024-01-10 NOTE — Progress Notes (Addendum)
 01/10/2024 Clarence [name] [id number] 11-07-1970  Referring provider: Leonarda Roxan BROCKS, NP Primary GI doctor: Dr. Suzann  ASSESSMENT AND PLAN:  Crohn's disease  status post colectomy with end ileostomy 2008 at [facility] Pathology confirmed crohn's, no dysplasia 2008 Presents with nausea and high output ostomy, no hematochezia 10/18/2023 CT abdomen pelvis without contrast no acute change  C. difficile and GI pathogen negative, fecal calprotectin 39 06/17/2023 right upper quadrant abdominal ultrasound unremarkable gallbladder 06/06/2023 CT abdomen pelvis with contrast moderately distended stomach with ingested material, submucosal fat duodenum may reflect chronic changes inflammatory bowel disease no active inflammation - check ESR, CRP, fecal cal,TSH, giardia had negative stool studies 10/2023 Sed rate 27, CRP normal, CBC without anemia, check giardia - start on fiber supplement, never did add last visit - Advise Imodium  as needed for high output, told can take max of 16 mg a day - get cortisol random- celiac - EGD/ileostomy 02/2024 since it has been 1 year on plavix  since DES.get cardiac clearance from cardiology and clearance to hold plavix  5 days - consider SIBO testing/empiric treatment  CAD 03/02/2023 severe two-vessel coronary artery disease proximal-mid OM1 and chronic total occlusion mid RCA with left-to-right collaterals, moderate mid LAD and left circumflex disease similar to prior catheterizations status post PCI to OM1 with DES On clopidogrel  Also had recent ER visit 08/14 for elevated BP and chest pain, took NTG, went to ER but left after 5 hours. No chest pain at this time Will schedule EGD and ileostomy with cardia clearance and permission to hold plavix   Chronic heart failure with reduced ejection fraction 07/22/2023 echo EF 45 to 50%, grade 1 diastolic normal pulmonary artery systolic pressure, normal valves  CKD BUN 23 Cr 1.72  GFR 47  Potassium 4.7  Magnesium  2.6    Pulmonary artery hypertension 07/2021 right heart catheterization PA 67/30 Echo without elevated RSVP 07/2023  I have reviewed the clinic note as outlined by Alan Coombs, PA and agree with the assessment, plan and medical decision making.  Clarence [name] returns to the office today for follow-up of high output ileostomy.  Laboratory testing has not disclosed an etiology for his high output ileostomy -TSH, celiac panel, cortisol, stool studies for parasite and inflammatory markers normal.  Cross-sectional imaging has not disclosed significant inflammatory changes of the bowel.  Agree with endoscopic reevaluation once he is 1 year status post DES.  Would recommend proceeding with breath testing for possible SIBO.  Agree with antidiarrheals in the form of Imodium  and Pepto-Bismol.  Consider Lomotil if these agents are not effective for alternating doses of Imodium  and Lomotil throughout the day.  Clarence Suzann, MD    Patient Care Team: Ngetich, Roxan BROCKS, NP as PCP - General (Family Medicine) End, Lonni, MD as PCP - Cardiology (Cardiology) Rolan Ezra RAMAN, MD as PCP - Advanced Heart Failure (Cardiology)  HISTORY OF PRESENT ILLNESS: 53 y.o. male presents as a new patient for evaluation of diarrhea in the setting of colonic Crohn's disease status post colectomy with end ileostomy 2009 at [facility]. Last seen in the office 11/08/2023 for nausea, intermittent high output ostomy without hematochezia.  Patient referred to ileostomy clinic, advised to get on psyllium husk and Imodium . Patient never completed TB Gold celiac hepatitis B Giardia or cortisol. Sed rate 27, CRP normal, CBC without anemia  IBD history: Diagnosed 1990 at [facility] Possible perirecal abscess in the beginning, never any fistulas Uncertain what medications he was on prior, was on a lot of clinical trials but  uncertain what medications, nothing consistent 2008 [facility] Colectomy with end ileostomy Long term prednisone  use but weaned  off  Last colonoscopy: remote Last small bowel imaging:  10/18/2023 CT abdomen pelvis without contrast for nausea and vomiting worse watery output from ostomy showed no acute findings, stable postoperative changes status post colectomy with right lower quadrant end ileostomy unchanged 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  Other significant medical history: CAD with recent catheterization and stenting 02/2023 on Plavix  History of pulmonary hypertension Heart failure with reduced ejection fraction most recent echo ejection fraction 40-45% GERD with nausea/vomiting  Current History Discussed the use of AI scribe software for clinical note transcription with the patient, who gave verbal consent to proceed.  History of Present Illness   Clarence [name] is a 53 year old male with a history of ileostomy who presents with high output from the ostomy and dehydration.  He experiences high output from his ileostomy, which is consistent and leads to dehydration. The output sometimes fills the bag completely without him noticing until he moves or touches it. Despite taking Imodium  and Pepto Bismol, the output remains 'very liquidy', and these medications only provide temporary relief for a few hours. He feels he is losing more liquid to his stool than he can replenish by drinking fluids.  He feels dehydrated and experiences nausea. He has difficulty sleeping and prefers to rest in the afternoon due to fatigue. No blood is noted in his stool; he describes the color as light tan.  He uses Imodium  and Pepto Bismol to manage symptoms, taking Pepto Bismol around noon and switching to Imodium  around 5 PM. He uses liquid forms of both medications and is unsure of the exact dosage but mentions taking a  'full cup' of Imodium .  He reports episodes of chest pain and shortness of breath, particularly when he misses his medication schedule. He feels exhausted after walking short distances and associates chest tightness with being late on his medication. He was previously admitted to the hospital with high blood pressure, recorded at 160/140 mmHg, and experienced chest pain during this episode. He takes nitroglycerin  for chest pain and is on Plavix  (clopidogrel ).  He mentions a history of a hernia, identified following a CT scan a year ago, but has not experienced pain or discomfort from it.  He has stage three kidney disease and has noticed changes in his urine color, sometimes appearing yellow and other times dark. He has not yet had a urine test to investigate this further.      Recent labs: 06/24/2023 CRP 0.6 06/24/2023 SED RATE 12 10/19/2023 WBC 8.1 HGB 15.0 MCV 80.2 Platelets 242 B12 439 FOLATE No results found for requested labs within last 1095 days. 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  - No evidence of dysplasia or malignancy.  - No evidence of viral cytopathic change.   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  - No evidence of dysplasia or  malignancy.  - No evidence of viral cytopathic change.   CBC    Component Value Date/Time   WBC 9.3 12/30/2023 1736   RBC 5.63 12/30/2023 1736   HGB 15.7 12/30/2023 1736   HGB 14.8 03/23/2023 1511   HCT 47.0 12/30/2023 1736   HCT 45.4 03/23/2023 1511   PLT 320 12/30/2023  1736   PLT 269 03/23/2023 1511   MCV 83.5 12/30/2023 1736   MCV 86 03/23/2023 1511   MCH 27.9 12/30/2023 1736   MCHC 33.4 12/30/2023 1736   RDW 15.6 (H) 12/30/2023 1736   RDW 13.7 03/23/2023 1511   LYMPHSABS 2.0 11/11/2023 0256   LYMPHSABS 1.6 03/23/2023 1511   MONOABS 0.9 11/11/2023 0256   EOSABS 0.2 11/11/2023 0256   EOSABS 0.2 03/23/2023 1511   BASOSABS 0.0 11/11/2023 0256   BASOSABS 0.0 03/23/2023 1511   Recent Labs    06/14/23 0941 06/15/23 0615 06/17/23 0442 10/18/23 1437 10/19/23 0512 11/08/23 1527 11/11/23 0256 11/11/23 1024 11/12/23 0436 12/30/23 1736  HGB 15.0 13.2 14.4 16.6 15.0 13.9 13.5 12.8* 13.4 15.7    CMP     Component Value Date/Time   NA 139 12/30/2023 1736   NA 144 03/23/2023 1511   K 4.7 12/30/2023 1736   CL 110 12/30/2023 1736   CO2 22 12/30/2023 1736   GLUCOSE 117 (H) 12/30/2023 1736   BUN 23 (H) 12/30/2023 1736   BUN 25 (H) 03/23/2023 1511   CREATININE 1.72 (H) 12/30/2023 1736   CREATININE 2.11 (H) 03/09/2023 1507   CALCIUM  9.9 12/30/2023 1736   PROT 7.6 12/30/2023 1736   ALBUMIN 4.1 12/30/2023 1736   AST 24 12/30/2023 1736   ALT 30 12/30/2023 1736   ALKPHOS 80 12/30/2023 1736   BILITOT 1.6 (H) 12/30/2023 1736   GFRNONAA 47 (L) 12/30/2023 1736   GFRAA  06/11/2010 1440    >60        The eGFR has been calculated using the MDRD equation. This calculation has not been validated in all clinical situations. eGFR's persistently <60 mL/min signify possible Chronic Kidney Disease.      Latest Ref Rng & Units 12/30/2023    5:36 PM 11/12/2023    4:36 AM 11/11/2023    2:56 AM  Hepatic Function  Total Protein 6.5 - 8.1 g/dL 7.6  6.3  6.8   Albumin 3.5 - 5.0 g/dL 4.1  3.2  3.5   AST 15 - 41 U/L 24  26  35   ALT 0 - 44 U/L 30  40  48   Alk Phosphatase 38 - 126 U/L 80  72  88   Total Bilirubin 0.0 - 1.2 mg/dL 1.6  1.2  0.7       Current Medications:   Current Outpatient Medications (Endocrine & Metabolic):    empagliflozin   (JARDIANCE ) 10 MG TABS tablet, Take 10 mg by mouth daily.  Current Outpatient Medications (Cardiovascular):    atorvastatin  (LIPITOR ) 80 MG tablet, Take 1 tablet (80 mg total) by mouth every evening.   carvedilol  (COREG ) 3.125 MG tablet, Take 1 tablet (3.125 mg total) by mouth 2 (two) times daily. (Patient taking differently: Take 6.25 mg by mouth daily.)   furosemide  (LASIX ) 20 MG tablet, Take 1 tablet (20 mg total) by mouth daily as needed. For weight gain of 3 lb in 24 hours or 5 lbs in one week   isosorbide  mononitrate (IMDUR ) 60 MG 24 hr tablet, Take 60 mg by mouth. Take 1 tab every morning and 1/2 tab  every evening   ranolazine  (RANEXA ) 500 MG 12 hr tablet, Take 500 mg by mouth 2 (two) times daily.   sacubitril -valsartan  (ENTRESTO ) 24-26 MG, Take 1 tablet by mouth 2 (two) times daily.   Current Outpatient Medications (Analgesics):    aspirin  EC 81 MG tablet, Take 81 mg by mouth daily. Swallow whole.  Current Outpatient Medications (Hematological):    clopidogrel  (PLAVIX ) 75 MG tablet, Take 1 tablet (75 mg total) by mouth daily.  Current Outpatient Medications (Other):    famotidine  (PEPCID ) 20 MG tablet, Take 20 mg by mouth 2 (two) times daily.   Na Sulfate-K Sulfate-Mg Sulfate concentrate (SUPREP) 17.5-3.13-1.6 GM/177ML SOLN, Take 1 kit (354 mLs total) by mouth once for 1 dose.  Medical History:  Past Medical History:  Diagnosis Date   Ascending Aortic Dilation    a. 09/2021 Echo: Asc Ao 40mm.   CAD (coronary artery disease)    a. 07/2021 Cath: LM nl, LAD 9m, D2 40, LCX 65p/m, OM1 90, RCA 159m w/ L-.R collats to RPDA from septal 1/2-->Med rx.   Chronic HFrEF (heart failure with reduced ejection fraction) (HCC)    a. 07/2021 Echo: EF 30-35%, glob HK, mod LVH, GrII DD, nl RV fxn, mildly dil LA, mild-mod MR; b. 09/2021 Echo: EF 35-40%, glob HK, mod LVH, GrI DD, nl RV fxn, mildly dil RA, mild MR, Asc Ao 40mm.   CKD (chronic kidney disease) stage 2, GFR 60-89 ml/min    Crohn's disease  (HCC)    Hypertension    Mixed Ischemic & Nonischemic cardiomyopathy    a. 07/2021 Echo: EF 30-35%; b. 07/2021 Cath: Occluded RCA w/ mod LAD/LCX dzs, and severe OM1 dzs-->Med Rx; c. 09/2021 Echo: EF 35-40%.   PAH (pulmonary artery hypertension) (HCC)    a. 07/2021 RHC: PA 67/30 (42).   Proteinuria    Allergies:  Allergies  Allergen Reactions   Ceftriaxone  Rash    Macular rash with itchness   Ibuprofen Other (See Comments)    History of GIB with ibuprofen    Sulfacetamide Other (See Comments)   Neomycin-Bacitracin Zn-Polymyx     Other reaction(s): Other (See Comments)   Spironolactone      Hyperkalemia     Surgical History:  He  has a past surgical history that includes Colectomy; RIGHT/LEFT HEART CATH AND CORONARY ANGIOGRAPHY (N/A, 08/07/2021); RIGHT/LEFT HEART CATH AND CORONARY ANGIOGRAPHY (N/A, 09/29/2022); LEFT HEART CATH AND CORONARY ANGIOGRAPHY (Left, 03/02/2023); and CORONARY STENT INTERVENTION (N/A, 03/02/2023). Family History:  His family history includes Cancer in his maternal aunt; Heart disease in his maternal uncle; Hypertension in his mother; Pancreatic cancer in his maternal uncle.  REVIEW OF SYSTEMS  : All other systems reviewed and negative except where noted in the History of Present Illness.  PHYSICAL EXAM: BP 130/88 (BP Location: Left Arm, Patient Position: Sitting, Cuff Size: Normal)   Pulse 97   Ht 5' 10 (1.778 m)   Wt 236 lb 6 oz (107.2 kg)   BMI 33.92 kg/m  Physical Exam   GENERAL APPEARANCE: Well nourished, in no apparent distress. HEENT: No cervical lymphadenopathy, unremarkable thyroid , sclerae anicteric, conjunctiva pink. RESPIRATORY: Respiratory effort normal, breath sounds equal bilaterally without rales, rhonchi, or wheezing. CARDIO: Regular rate and rhythm with no murmurs, rubs, or gallops, peripheral pulses intact. ABDOMEN: Soft, non-distended, active bowel sounds in all four quadrants, no tenderness to palpation, no rebound, no mass appreciated.  Stoma with mild herniation, no discomfort. RECTAL: Declines. MUSCULOSKELETAL: Full range of motion, normal gait, without edema. SKIN: Dry, intact  without rashes or lesions. No jaundice. NEURO: Alert, oriented, no focal deficits. PSYCH: Cooperative, normal mood and affect.        Alan JONELLE Coombs, PA-C 3:49 PM

## 2024-01-10 NOTE — Telephone Encounter (Signed)
 [facility] Pre-operative Risk Assessment     Request for surgical clearance:     Endoscopy Procedure  What type of surgery is being performed?  EGD/ILEOSCOPY  When is this surgery scheduled?  03-03-24  What type of clearance is required ? CARDIAC CLEARANCE  AND Pharmacy  Are there any medications that need to be held prior to surgery and how long? PLAVIX  5 DAYS  Practice name and name of physician performing surgery?    [facility]  [facility]  What is your office phone and fax number?      Phone- [phone]  Fax- [phone]  Anesthesia type (None, local, MAC, general) ?       MAC  Please route your response to Arber Wiemers, CMA  THANK YOU

## 2024-01-11 ENCOUNTER — Other Ambulatory Visit: Payer: Self-pay

## 2024-01-11 ENCOUNTER — Ambulatory Visit (HOSPITAL_COMMUNITY)
Admission: RE | Admit: 2024-01-11 | Discharge: 2024-01-11 | Disposition: A | Discharge status: Home / Self Care | Source: Ambulatory Visit | Attending: Family | Admitting: Family

## 2024-01-11 DIAGNOSIS — K5090 Crohn's disease, unspecified, without complications: Secondary | ICD-10-CM | POA: Diagnosis not present

## 2024-01-11 DIAGNOSIS — I428 Other cardiomyopathies: Secondary | ICD-10-CM | POA: Diagnosis not present

## 2024-01-11 DIAGNOSIS — I130 Hypertensive heart and chronic kidney disease with heart failure and stage 1 through stage 4 chronic kidney disease, or unspecified chronic kidney disease: Secondary | ICD-10-CM | POA: Insufficient documentation

## 2024-01-11 DIAGNOSIS — Z7984 Long term (current) use of oral hypoglycemic drugs: Secondary | ICD-10-CM | POA: Insufficient documentation

## 2024-01-11 DIAGNOSIS — Z7982 Long term (current) use of aspirin: Secondary | ICD-10-CM | POA: Insufficient documentation

## 2024-01-11 DIAGNOSIS — Z7902 Long term (current) use of antithrombotics/antiplatelets: Secondary | ICD-10-CM | POA: Insufficient documentation

## 2024-01-11 DIAGNOSIS — Z432 Encounter for attention to ileostomy: Secondary | ICD-10-CM | POA: Diagnosis not present

## 2024-01-11 DIAGNOSIS — K435 Parastomal hernia without obstruction or  gangrene: Secondary | ICD-10-CM | POA: Insufficient documentation

## 2024-01-11 DIAGNOSIS — Z79899 Other long term (current) drug therapy: Secondary | ICD-10-CM | POA: Insufficient documentation

## 2024-01-11 DIAGNOSIS — I10 Essential (primary) hypertension: Secondary | ICD-10-CM

## 2024-01-11 LAB — CORTISOL: Cortisol, Plasma: 7.1 ug/dL

## 2024-01-11 NOTE — Progress Notes (Signed)
 [facility]   Reason for visit:  RLQ ileostomy HPI:  Crohn's disease with end ileostomy.  Parastomal hernia  Past Medical History:  Diagnosis Date   Ascending Aortic Dilation    a. 09/2021 Echo: Asc Ao 40mm.   CAD (coronary artery disease)    a. 07/2021 Cath: LM nl, LAD 66m, D2 40, LCX 65p/m, OM1 90, RCA 120m w/ L-.R collats to RPDA from septal 1/2-->Med rx.   Chronic HFrEF (heart failure with reduced ejection fraction) (HCC)    a. 07/2021 Echo: EF 30-35%, glob HK, mod LVH, GrII DD, nl RV fxn, mildly dil LA, mild-mod MR; b. 09/2021 Echo: EF 35-40%, glob HK, mod LVH, GrI DD, nl RV fxn, mildly dil RA, mild MR, Asc Ao 40mm.   CKD (chronic kidney disease) stage 2, GFR 60-89 ml/min    Crohn's disease (HCC)    Hypertension    Mixed Ischemic & Nonischemic cardiomyopathy    a. 07/2021 Echo: EF 30-35%; b. 07/2021 Cath: Occluded RCA w/ mod LAD/LCX dzs, and severe OM1 dzs-->Med Rx; c. 09/2021 Echo: EF 35-40%.   PAH (pulmonary artery hypertension) (HCC)    a. 07/2021 RHC: PA 67/30 (42).   Proteinuria    Family History  Problem Relation Age of Onset   Hypertension Mother    Cancer Maternal Aunt    Heart disease Maternal Uncle    Pancreatic cancer Maternal Uncle    Allergies  Allergen Reactions   Ceftriaxone  Rash    Macular rash with itchness   Ibuprofen Other (See Comments)    History of GIB with ibuprofen    Sulfacetamide Other (See Comments)   Neomycin-Bacitracin Zn-Polymyx     Other reaction(s): Other (See Comments)   Spironolactone      Hyperkalemia   Current Outpatient Medications  Medication Sig Dispense Refill Last Dose/Taking   aspirin  EC 81 MG tablet Take 81 mg by mouth daily. Swallow whole.      atorvastatin  (LIPITOR ) 80 MG tablet Take 1 tablet (80 mg total) by mouth every evening. 90 tablet 3    carvedilol  (COREG ) 3.125 MG tablet Take 1 tablet (3.125 mg total) by mouth 2 (two) times daily. (Patient taking differently: Take 6.25 mg by mouth daily.) 60 tablet 0     clopidogrel  (PLAVIX ) 75 MG tablet Take 1 tablet (75 mg total) by mouth daily. 30 tablet 11    empagliflozin  (JARDIANCE ) 10 MG TABS tablet Take 10 mg by mouth daily.      famotidine  (PEPCID ) 20 MG tablet Take 20 mg by mouth 2 (two) times daily.      furosemide  (LASIX ) 20 MG tablet Take 1 tablet (20 mg total) by mouth daily as needed. For weight gain of 3 lb in 24 hours or 5 lbs in one week 90 tablet 1    isosorbide  mononitrate (IMDUR ) 60 MG 24 hr tablet Take 60 mg by mouth. Take 1 tab every morning and 1/2 tab every evening      ranolazine  (RANEXA ) 500 MG 12 hr tablet Take 500 mg by mouth 2 (two) times daily.      sacubitril -valsartan  (ENTRESTO ) 24-26 MG Take 1 tablet by mouth 2 (two) times daily. 180 tablet 3    No current facility-administered medications for this encounter.   ROS  Review of Systems  Constitutional:  Positive for fatigue (Feels tired today. BP elevated at start of visit).  Respiratory: Negative.    Cardiovascular:        Rechecked BP at end of visit  144/94  Gastrointestinal:        RLQ ileostomy Crohn's disease  Skin:  Positive for color change (darkened skin, intact around stoma).  Psychiatric/Behavioral: Negative.    All other systems reviewed and are negative.  Vital signs:  BP (!) 132/102 (BP Location: Right Arm)   Pulse 86   Temp 98.8 F (37.1 C) (Oral)   Resp 18   SpO2 97%  Exam:  Physical Exam Vitals reviewed.  Constitutional:      Appearance: Normal appearance.  HENT:     Mouth/Throat:     Mouth: Mucous membranes are moist.  Cardiovascular:     Rate and Rhythm: Normal rate and regular rhythm.  Abdominal:     Palpations: Abdomen is soft.     Hernia: A hernia (peristomal hernia) is present.  Musculoskeletal:        General: Normal range of motion.  Skin:    General: Skin is warm and dry.  Neurological:     General: No focal deficit present.     Mental Status: He is alert and oriented to person, place, and time.  Psychiatric:        Mood and  Affect: Mood normal.        Behavior: Behavior normal.     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  Treatment options for stomal/peristomal skin: Stoma powder skin prep barrier ring and Mio flip pouch.  Getting wear time of 7-8 days Output: liquid brown stool  Ostomy pouching: 1pc. Mio flip Education provided:  No changes in pouch at this time.     Impression/dx  Hypertension- feels a little sleepy, but otherwise normal.  No shortness of breath. Rechecked BP at end of visit.  Discussion  [place]ing on [place] to begin ordering supplies from [place].  Will send in orders to prepare.  Plan  See back as needed.     Visit time: 55 minutes.   Darice Cooley [name]-BC

## 2024-01-11 NOTE — Telephone Encounter (Signed)
 Cancel request.  Patient has been granted cardiac clearance and clearance to hold Plavix  for 5 days by provider. Thanks

## 2024-01-11 NOTE — Telephone Encounter (Signed)
 Called and spoke to patient. He is aware he has to hold Plavix  starting on Oct 12th.   Patient was provided with info for:  [facility] at [phone] since he indicates he does not have anyone to bring him

## 2024-01-12 ENCOUNTER — Other Ambulatory Visit (HOSPITAL_COMMUNITY): Payer: Self-pay | Admitting: Nurse Practitioner

## 2024-01-12 DIAGNOSIS — R198 Other specified symptoms and signs involving the digestive system and abdomen: Secondary | ICD-10-CM

## 2024-01-12 DIAGNOSIS — L24B3 Irritant contact dermatitis related to fecal or urinary stoma or fistula: Secondary | ICD-10-CM

## 2024-01-13 LAB — QUANTIFERON-TB GOLD PLUS
Mitogen-NIL: >10 [IU]/mL
NIL: 0.04 [IU]/mL
QuantiFERON-TB Gold Plus: NEGATIVE
TB1-NIL: 0.01 [IU]/mL
TB2-NIL: 0.01 [IU]/mL

## 2024-01-13 LAB — TISSUE TRANSGLUTAMINASE, IGA: (tTG) Ab, IgA: <1 U/mL

## 2024-01-13 LAB — IGA: Immunoglobulin A: 483 mg/dL — ABNORMAL HIGH (ref 47–310)

## 2024-01-13 LAB — HEPATITIS B SURFACE ANTIGEN: Hepatitis B Surface Ag: NONREACTIVE

## 2024-01-17 ENCOUNTER — Encounter: Payer: Self-pay | Admitting: Pediatrics

## 2024-01-18 ENCOUNTER — Telehealth: Payer: Self-pay

## 2024-01-18 NOTE — Telephone Encounter (Addendum)
-----   Message from Alan JONELLE Coombs sent at 01/18/2024 10:17 AM EDT ----- Regarding: SIBO Dr. Suzann would like to pursue SIBO breath test for this patient.  Please set up.  Small intestinal bacterial overgrowth (SIBO) occurs when there is an abnormal increase in the overall bacterial population in the small intestine - particularly types of bacteria not commonly found in that part of the digestive tract. Small intestinal bacterial overgrowth (SIBO) commonly results when a circumstance - such as surgery or disease - slows the passage of food and waste products in the digestive tract, creating a breeding ground for bacteria.  Signs and symptoms of SIBO often include: Loss of appetite Abdominal pain Nausea Bloating An uncomfortable feeling of fullness after eating Diarrhea or constipation, depending on the type of gas produced  What foods trigger SIBO? While foods aren't the original cause of SIBO, certain foods do encourage the overgrowth of the wrong bacteria in your small intestine. If you're feeding them their favorite foods, they're going to grow more, and that will trigger more of your SIBO symptoms. By the same token, you can help reduce the overgrowth by starving the problematic bacteria of their favorite foods. This strategy has led to a number of proposed SIBO eating plans. The plans vary, and so do individual results. But in general, they tend to recommend limiting carbohydrates.  These include: Sugars and sweeteners. Fruits and starchy vegetables. Dairy products. Grains.  There is a test for this we can do called a breath test, if you are positive we will treat you with an antibiotic to see if it helps.  Your symptoms are very suspicious for this condition, as discussed, we will start you on an antibiotic to see if this helps. ----- Message ----- From: Suzann Inocente HERO, MD Sent: 01/16/2024   4:20 PM EDT To: Alan JONELLE Coombs, PA-C  I think it would be reasonable to go ahead and  pursue breath testing for SIBO at this time given that we will need to wait at least another month or so for endoscopic studies.

## 2024-01-18 NOTE — Telephone Encounter (Signed)
 I was completed the [facility] order form and it states:  We Do Not Bill Medicaid use the "No Insurance Option" No Education officer, environmental (Complete section 7, reverse side) Pre-payment - Please include the discounted cost of $209.74. Payment plan - Please contact us  at [phone]. Financial Assistance - Please contact us  at [phone].

## 2024-01-25 ENCOUNTER — Other Ambulatory Visit (HOSPITAL_COMMUNITY): Payer: Self-pay | Admitting: Nurse Practitioner

## 2024-01-25 ENCOUNTER — Ambulatory Visit (HOSPITAL_COMMUNITY)
Admission: RE | Admit: 2024-01-25 | Discharge: 2024-01-25 | Disposition: A | Discharge status: Home / Self Care | Source: Ambulatory Visit | Attending: Nurse Practitioner | Admitting: Nurse Practitioner

## 2024-01-25 DIAGNOSIS — L24B3 Irritant contact dermatitis related to fecal or urinary stoma or fistula: Secondary | ICD-10-CM | POA: Diagnosis not present

## 2024-01-25 DIAGNOSIS — Z79899 Other long term (current) drug therapy: Secondary | ICD-10-CM | POA: Insufficient documentation

## 2024-01-25 DIAGNOSIS — Z932 Ileostomy status: Secondary | ICD-10-CM | POA: Diagnosis not present

## 2024-01-25 DIAGNOSIS — Z7902 Long term (current) use of antithrombotics/antiplatelets: Secondary | ICD-10-CM | POA: Diagnosis not present

## 2024-01-25 DIAGNOSIS — K435 Parastomal hernia without obstruction or  gangrene: Secondary | ICD-10-CM | POA: Diagnosis not present

## 2024-01-25 DIAGNOSIS — Z7984 Long term (current) use of oral hypoglycemic drugs: Secondary | ICD-10-CM | POA: Insufficient documentation

## 2024-01-25 MED ORDER — FAMOTIDINE 40 MG PO TABS
40.0000 mg | ORAL_TABLET | Freq: Every evening | ORAL | 1 refills | Status: AC
Start: 2024-01-25 — End: 2025-01-24

## 2024-01-25 NOTE — Progress Notes (Signed)
 [facility]   Reason for visit:  RLQ ileostomy HPI:  Crohn's disease with end ileostomy Past Medical History:  Diagnosis Date   Ascending Aortic Dilation    a. 09/2021 Echo: Asc Ao 40mm.   CAD (coronary artery disease)    a. 07/2021 Cath: LM nl, LAD 74m, D2 40, LCX 65p/m, OM1 90, RCA 143m w/ L-.R collats to RPDA from septal 1/2-->Med rx.   Chronic HFrEF (heart failure with reduced ejection fraction) (HCC)    a. 07/2021 Echo: EF 30-35%, glob HK, mod LVH, GrII DD, nl RV fxn, mildly dil LA, mild-mod MR; b. 09/2021 Echo: EF 35-40%, glob HK, mod LVH, GrI DD, nl RV fxn, mildly dil RA, mild MR, Asc Ao 40mm.   CKD (chronic kidney disease) stage 2, GFR 60-89 ml/min    Crohn's disease (HCC)    Hypertension    Mixed Ischemic & Nonischemic cardiomyopathy    a. 07/2021 Echo: EF 30-35%; b. 07/2021 Cath: Occluded RCA w/ mod LAD/LCX dzs, and severe OM1 dzs-->Med Rx; c. 09/2021 Echo: EF 35-40%.   PAH (pulmonary artery hypertension) (HCC)    a. 07/2021 RHC: PA 67/30 (42).   Proteinuria    Family History  Problem Relation Age of Onset   Hypertension Mother    Cancer Maternal Aunt    Heart disease Maternal Uncle    Pancreatic cancer Maternal Uncle    Allergies  Allergen Reactions   Ceftriaxone  Rash    Macular rash with itchness   Ibuprofen Other (See Comments)    History of GIB with ibuprofen    Sulfacetamide Other (See Comments)    Pt can tolerate sulfacetamide 500mg     Neomycin-Bacitracin Zn-Polymyx     Other reaction(s): Other (See Comments)   Spironolactone      Hyperkalemia   Current Outpatient Medications  Medication Sig Dispense Refill Last Dose/Taking   atorvastatin  (LIPITOR ) 80 MG tablet Take 1 tablet (80 mg total) by mouth every evening. 90 tablet 3    carvedilol  (COREG ) 3.125 MG tablet Take 1 tablet (3.125 mg total) by mouth 2 (two) times daily. (Patient taking differently: Take 6.25 mg by mouth daily.) 60 tablet 0    clopidogrel  (PLAVIX ) 75 MG tablet Take 1 tablet (75 mg  total) by mouth daily. 30 tablet 11    empagliflozin  (JARDIANCE ) 10 MG TABS tablet Take 10 mg by mouth daily.      famotidine  (PEPCID ) 40 MG tablet Take 1 tablet (40 mg total) by mouth every evening. 30 tablet 1    furosemide  (LASIX ) 20 MG tablet Take 1 tablet (20 mg total) by mouth daily as needed. For weight gain of 3 lb in 24 hours or 5 lbs in one week 90 tablet 1    isosorbide  mononitrate (IMDUR ) 60 MG 24 hr tablet Take 60 mg by mouth. Take 1 tab every morning and 1/2 tab every evening      ranolazine  (RANEXA ) 500 MG 12 hr tablet Take 500 mg by mouth 2 (two) times daily.      sacubitril -valsartan  (ENTRESTO ) 24-26 MG Take 1 tablet by mouth 2 (two) times daily. 180 tablet 3    No current facility-administered medications for this encounter.   ROS  Review of Systems  Constitutional:  Positive for fatigue.  Respiratory: Negative.    Cardiovascular: Negative.   Gastrointestinal:        RLQ ileostomy Parastomal hernia   Skin:  Positive for color change.       Darkened skin around stoma, intact  Neurological:  Negative.   Psychiatric/Behavioral: Negative.    All other systems reviewed and are negative.  Vital signs:  BP 109/70   Pulse 85   Resp 16   SpO2 97%  Exam:  Physical Exam Vitals reviewed.  Constitutional:      Appearance: Normal appearance.  HENT:     Mouth/Throat:     Mouth: Mucous membranes are moist.  Cardiovascular:     Rate and Rhythm: Normal rate.     Pulses: Normal pulses.  Pulmonary:     Effort: Pulmonary effort is normal.  Abdominal:     Hernia: A hernia is present.  Musculoskeletal:        General: Normal range of motion.  Skin:    General: Skin is warm and dry.  Neurological:     General: No focal deficit present.     Mental Status: He is alert and oriented to person, place, and time. Mental status is at baseline.  Psychiatric:        Mood and Affect: Mood normal.        Behavior: Behavior normal.     Stoma type/location:  RLQ ileostomy Stomal  assessment/size:  1 3/8 budded Peristomal assessment:  Parastomal hernia, 2 areas with granulomas at Mucocutaneous junction.  Was treated with silver nitrate, are smaller now and do not bleed.  Treatment options for stomal/peristomal skin: Stoma powder and skin prep  1 piece covnex mio flip  Output: liquid yellow stool  Ostomy pouching: 1pc. Coloplast mio flip  Education provided:  Is set up with [employer] for supplies      Impression/dx  Ileostomy Granulomas Parastomal hernia Discussion  Continue pouching as ordered.  Is set up with [employer] for supplies.  Plan  See back as needed.     Visit time: 45 minutes.   Darice Cooley [name]-BC

## 2024-01-26 ENCOUNTER — Observation Stay
Admission: EM | Admit: 2024-01-26 | Discharge: 2024-01-28 | Disposition: A | Discharge status: Home / Self Care | Attending: Student | Admitting: Student

## 2024-01-26 ENCOUNTER — Emergency Department

## 2024-01-26 ENCOUNTER — Telehealth: Payer: Self-pay | Admitting: Family

## 2024-01-26 ENCOUNTER — Other Ambulatory Visit: Payer: Self-pay

## 2024-01-26 DIAGNOSIS — Z932 Ileostomy status: Secondary | ICD-10-CM | POA: Insufficient documentation

## 2024-01-26 DIAGNOSIS — I130 Hypertensive heart and chronic kidney disease with heart failure and stage 1 through stage 4 chronic kidney disease, or unspecified chronic kidney disease: Secondary | ICD-10-CM | POA: Insufficient documentation

## 2024-01-26 DIAGNOSIS — R197 Diarrhea, unspecified: Secondary | ICD-10-CM | POA: Diagnosis not present

## 2024-01-26 DIAGNOSIS — I5022 Chronic systolic (congestive) heart failure: Secondary | ICD-10-CM | POA: Insufficient documentation

## 2024-01-26 DIAGNOSIS — Z87891 Personal history of nicotine dependence: Secondary | ICD-10-CM | POA: Diagnosis not present

## 2024-01-26 DIAGNOSIS — Z7902 Long term (current) use of antithrombotics/antiplatelets: Secondary | ICD-10-CM | POA: Insufficient documentation

## 2024-01-26 DIAGNOSIS — I428 Other cardiomyopathies: Secondary | ICD-10-CM | POA: Diagnosis not present

## 2024-01-26 DIAGNOSIS — I2510 Atherosclerotic heart disease of native coronary artery without angina pectoris: Secondary | ICD-10-CM | POA: Diagnosis not present

## 2024-01-26 DIAGNOSIS — Z7982 Long term (current) use of aspirin: Secondary | ICD-10-CM | POA: Diagnosis not present

## 2024-01-26 DIAGNOSIS — R079 Chest pain, unspecified: Secondary | ICD-10-CM | POA: Diagnosis not present

## 2024-01-26 DIAGNOSIS — F10920 Alcohol use, unspecified with intoxication, uncomplicated: Secondary | ICD-10-CM | POA: Insufficient documentation

## 2024-01-26 DIAGNOSIS — E785 Hyperlipidemia, unspecified: Secondary | ICD-10-CM | POA: Insufficient documentation

## 2024-01-26 DIAGNOSIS — E860 Dehydration: Principal | ICD-10-CM | POA: Insufficient documentation

## 2024-01-26 DIAGNOSIS — N182 Chronic kidney disease, stage 2 (mild): Secondary | ICD-10-CM | POA: Diagnosis not present

## 2024-01-26 DIAGNOSIS — N179 Acute kidney failure, unspecified: Principal | ICD-10-CM | POA: Insufficient documentation

## 2024-01-26 DIAGNOSIS — Z79899 Other long term (current) drug therapy: Secondary | ICD-10-CM | POA: Diagnosis not present

## 2024-01-26 DIAGNOSIS — R42 Dizziness and giddiness: Secondary | ICD-10-CM | POA: Insufficient documentation

## 2024-01-26 DIAGNOSIS — I959 Hypotension, unspecified: Secondary | ICD-10-CM | POA: Diagnosis not present

## 2024-01-26 DIAGNOSIS — R110 Nausea: Secondary | ICD-10-CM | POA: Diagnosis not present

## 2024-01-26 DIAGNOSIS — K435 Parastomal hernia without obstruction or  gangrene: Secondary | ICD-10-CM | POA: Diagnosis not present

## 2024-01-26 LAB — URINALYSIS, ROUTINE W REFLEX MICROSCOPIC
Bilirubin Urine: NEGATIVE
Glucose, UA: 50 mg/dL — AB
Hgb urine dipstick: NEGATIVE
Ketones, ur: NEGATIVE mg/dL
Nitrite: NEGATIVE
Protein, ur: 30 mg/dL — AB
Specific Gravity, Urine: 1.023 (ref 1.005–1.030)
pH: 5 (ref 5.0–8.0)

## 2024-01-26 LAB — BASIC METABOLIC PANEL WITH GFR
Anion gap: 9 (ref 5–15)
BUN: 38 mg/dL — ABNORMAL HIGH (ref 6–20)
CO2: 19 mmol/L — ABNORMAL LOW (ref 22–32)
Calcium: 9.1 mg/dL (ref 8.9–10.3)
Chloride: 110 mmol/L (ref 98–111)
Creatinine, Ser: 2.25 mg/dL — ABNORMAL HIGH (ref 0.61–1.24)
GFR, Estimated: 34 mL/min — ABNORMAL LOW (ref >60)
Glucose, Bld: 145 mg/dL — ABNORMAL HIGH (ref 70–99)
Potassium: 4.3 mmol/L (ref 3.5–5.1)
Sodium: 138 mmol/L (ref 135–145)

## 2024-01-26 LAB — PROTIME-INR
INR: 1 (ref 0.8–1.2)
Prothrombin Time: 13.8 s (ref 11.4–15.2)

## 2024-01-26 LAB — TROPONIN I (HIGH SENSITIVITY)
Troponin I (High Sensitivity): 37 ng/L — ABNORMAL HIGH (ref <18)
Troponin I (High Sensitivity): 39 ng/L — ABNORMAL HIGH (ref <18)

## 2024-01-26 LAB — CBC
HCT: 40.3 % (ref 39.0–52.0)
Hemoglobin: 13.5 g/dL (ref 13.0–17.0)
MCH: 28.1 pg (ref 26.0–34.0)
MCHC: 33.5 g/dL (ref 30.0–36.0)
MCV: 83.8 fL (ref 80.0–100.0)
Platelets: 270 K/uL (ref 150–400)
RBC: 4.81 MIL/uL (ref 4.22–5.81)
RDW: 15.2 % (ref 11.5–15.5)
WBC: 9.4 K/uL (ref 4.0–10.5)
nRBC: 0 % (ref 0.0–0.2)

## 2024-01-26 LAB — HEPATIC FUNCTION PANEL
ALT: 41 U/L (ref 0–44)
AST: 27 U/L (ref 15–41)
Albumin: 3.8 g/dL (ref 3.5–5.0)
Alkaline Phosphatase: 110 U/L (ref 38–126)
Bilirubin, Direct: 0.2 mg/dL (ref 0.0–0.2)
Indirect Bilirubin: 0.8 mg/dL (ref 0.3–0.9)
Total Bilirubin: 1 mg/dL (ref 0.0–1.2)
Total Protein: 7.2 g/dL (ref 6.5–8.1)

## 2024-01-26 LAB — LACTIC ACID, PLASMA: Lactic Acid, Venous: 1.1 mmol/L (ref 0.5–1.9)

## 2024-01-26 LAB — LIPASE, BLOOD: Lipase: 70 U/L — ABNORMAL HIGH (ref 11–51)

## 2024-01-26 MED ORDER — ACETAMINOPHEN 650 MG RE SUPP: 650.0000 mg | Freq: Four times a day (QID) | RECTAL | Status: DC | PRN

## 2024-01-26 MED ORDER — CARVEDILOL 6.25 MG PO TABS
6.2500 mg | ORAL_TABLET | Freq: Every day | ORAL | Status: DC
Start: 2024-01-27 — End: 2024-01-27
  Administered 2024-01-27: 6.25 mg via ORAL
  Filled 2024-01-26: qty 1

## 2024-01-26 MED ORDER — CLOPIDOGREL BISULFATE 75 MG PO TABS
75.0000 mg | ORAL_TABLET | Freq: Every day | ORAL | Status: DC
Start: 2024-01-27 — End: 2024-01-28
  Administered 2024-01-27: 75 mg via ORAL
  Filled 2024-01-26 (×2): qty 1

## 2024-01-26 MED ORDER — SODIUM CHLORIDE 0.9 % IV BOLUS
1000.0000 mL | Freq: Once | INTRAVENOUS | Status: AC
  Administered 2024-01-26: 1000 mL via INTRAVENOUS

## 2024-01-26 MED ORDER — ONDANSETRON HCL 4 MG/2ML IJ SOLN: 4.0000 mg | Freq: Four times a day (QID) | INTRAMUSCULAR | Status: DC | PRN

## 2024-01-26 MED ORDER — ATORVASTATIN CALCIUM 80 MG PO TABS
80.0000 mg | ORAL_TABLET | Freq: Every evening | ORAL | Status: DC
  Administered 2024-01-26 – 2024-01-27 (×2): 80 mg via ORAL
  Filled 2024-01-26: qty 1
  Filled 2024-01-26: qty 4

## 2024-01-26 MED ORDER — ACETAMINOPHEN 325 MG PO TABS
650.0000 mg | ORAL_TABLET | Freq: Four times a day (QID) | ORAL | Status: DC | PRN
  Administered 2024-01-27 – 2024-01-28 (×2): 650 mg via ORAL
  Filled 2024-01-26 (×2): qty 2

## 2024-01-26 MED ORDER — MAGNESIUM HYDROXIDE 400 MG/5ML PO SUSP: 30.0000 mL | Freq: Every day | ORAL | Status: DC | PRN

## 2024-01-26 MED ORDER — ASPIRIN 81 MG PO TBEC: 81.0000 mg | DELAYED_RELEASE_TABLET | Freq: Every day | ORAL | Status: DC

## 2024-01-26 MED ORDER — FAMOTIDINE 20 MG PO TABS
40.0000 mg | ORAL_TABLET | Freq: Every evening | ORAL | Status: DC
Start: 2024-01-27 — End: 2024-01-27

## 2024-01-26 MED ORDER — TRAZODONE HCL 50 MG PO TABS: 25.0000 mg | ORAL_TABLET | Freq: Every evening | ORAL | Status: DC | PRN

## 2024-01-26 MED ORDER — ONDANSETRON HCL 4 MG PO TABS: 4.0000 mg | ORAL_TABLET | Freq: Four times a day (QID) | ORAL | Status: DC | PRN

## 2024-01-26 MED ORDER — SODIUM CHLORIDE 0.9 % IV SOLN: INTRAVENOUS | Status: DC

## 2024-01-26 MED ORDER — ENOXAPARIN SODIUM 60 MG/0.6ML IJ SOSY
0.5000 mg/kg | PREFILLED_SYRINGE | INTRAMUSCULAR | Status: DC
  Filled 2024-01-26 (×2): qty 0.6

## 2024-01-26 MED ORDER — ISOSORBIDE MONONITRATE ER 60 MG PO TB24
60.0000 mg | ORAL_TABLET | Freq: Every day | ORAL | Status: DC
Start: 2024-01-27 — End: 2024-01-28
  Administered 2024-01-28: 60 mg via ORAL
  Filled 2024-01-26 (×2): qty 1

## 2024-01-26 MED ORDER — SACUBITRIL-VALSARTAN 24-26 MG PO TABS
1.0000 | ORAL_TABLET | Freq: Two times a day (BID) | ORAL | Status: DC
  Filled 2024-01-26: qty 1

## 2024-01-26 MED ORDER — RANOLAZINE ER 500 MG PO TB12
500.0000 mg | ORAL_TABLET | Freq: Two times a day (BID) | ORAL | Status: DC
  Administered 2024-01-26 – 2024-01-28 (×3): 500 mg via ORAL
  Filled 2024-01-26 (×5): qty 1

## 2024-01-26 NOTE — Progress Notes (Deleted)
 [facility] FAILURE FOLLOW UP CLINIC NOTE  Referring Physician: Leonarda Clarence BROCKS, NP  Primary Care: Clarence [name], Clarence BROCKS, NP HF cardiology: Clarence Barrack, MD  Chief Complaint: shortness of breath   HPI:  Clarence [name] is a 53 y.o. male with a PMH of chronic systolic HF, Crohn's disease, CKD, HTN who presents for follow up of chronic heart failure with mildly reduced EF.   Patient was admitted in 3/23 with newly found cardiomyopathy, CHF and atypical PNA.  Echo in 3/23 showed EF 30-35%, moderate LVH, normal RV. LHC showed occluded RCA with collaterals, 60% mLAD, 65% mLCx, 90% mid OM1.  RHC showed elevated filling pressures and low CI (2).  Patient was admitted again in 4/23 with CHF. He was admitted in 5/23 with hypertensive urgency and chest pain, CTA abdomen/pelvis did not show renal artery stenosis. He was admitted again in 6/23 with hypertensive urgency.  He quit smoking after 3/23 admission.  Rarely drinks ETOH and no drugs.  No strong family history of CAD or cardiomyopathy.     Sleep study was done, not suggestive of OSA.    Patient continued to have chest pain episodes so LHC/RHC was done in 5/24 showing occluded mRCA with collaterals, 90% stenosis with severe diffuse disease in OM1. 50% mLAD, 60% proximal and mid LCx (unchanged from prior, no interventional target); normal filling pressures and preserved cardiac output.    Patient had progressive angina with repeat cath in 10/24.  This showed similar severe diffuse disease in the OM1, he had DES to OM1.  Other disease unchanged, CTO of RCA, 50% mid LAD.   Echo 07/22/23: EF 45-50%, G1DD, normal RV, normal PA pressure, mild MR  Admitted 10/18/23 with n/v & increasing ostomy output. GI PCR negative, C. difficile negative. IVF given.    Seen in [facility] 06/25 where entresto  was stopped due to ongoing diarrhea & recent admission due to hypovolemic.   Admitted 11/11/23 with 24 hours of chest pain along with uncontrolled HTN. Troponins  trended and were flat. Entresto  24/26mg  BID was reintroduced.   SUBJECTIVE:  He presents today for a HF follow-up visit with a chief complaint of shortness of breath. Has associated fatigue, occasional dizziness. Noted some dizziness after a couple of times of laughing very hard. Denies pedal edema, abdominal distention, chest pain, palpitations. Ostomy output is stable.  Brought medications and he's been taking 3.125mg  carvedilol  once daily. Has another dose of 6.25mg  in a different bag. Says that he tends to just forget to take the second dose. Confirms that he's taking his entresto  BID. Took today's medications ~ 1/2 hour before coming to his appointment today.   PMH, current medications, allergies, social history, and family history reviewed in epic.  ROS: All systems negative except what is listed in HPI, PMH and Problem List   PHYSICAL EXAM:  General: Well appearing.  Cor: No JVD. Regular rhythm, rate.  Lungs: clear Abdomen: soft, nontender, nondistended. Ostomy intact Extremities: no edema Neuro:. Affect pleasant  There were no vitals filed for this visit.  Wt Readings from Last 3 Encounters:  01/10/24 236 lb 6 oz (107.2 kg)  12/30/23 240 lb (108.9 kg)  12/22/23 239 lb 12.8 oz (108.8 kg)   Lab Results  Component Value Date   CREATININE 1.37 01/10/2024   CREATININE 1.72 (H) 12/30/2023   CREATININE 1.34 (H) 12/22/2023    DATA REVIEW  ECHO: - Echo (3/23): EF 30-35%, moderate LVH, normal RV. - RHC (3/23): mean RA 16, PA 67/30 mean  42, mean PCWP 25, CI 2, PVR 4 - Echo (5/23): EF 35-40%, global HK, moderate LVH, RV normal, mild MR - Echo (4/24): EF 45-50%, inferior and inferolateral hypokinesis, normal RV.   - Echo 07/22/23: EF 45-50%, G1DD, normal RV, normal PA pressure, mild MR  CATH: LHC (10/24): Occluded mRCA with collaterals, 90% long/diffuse stenosis in OM1, 50% mLAD. He was treated with DES to OM1.     ASSESSMENT & PLAN:  1: Chronic heart failure with mid  range EF: Well compensated from a  HF standpoint, unfortunately majority of symptoms relate to known Crohn's. Prior colectomy plus ostomy.  - NYHA [name] II - euvolemic today - weight down 3 pounds from last visit here 1 month ago - Continue entresto  24/26mg  BID. May increase this to 49/51mg  after getting lab results back - Continue jardiance  10mg  daily.  - Resume coreg  3.125mg  BID. Explained the importance of taking this BID - Continue lasix  20mg  prn - developed hyperkalemia with previous spironolactone  use - BNP 11/11/23 was 74.7  2: CAD: No anginal symptoms today, has been a chronic complaint. - Continue imdur  60mg  daily, ASA 81mg  daily  - Continue clopidogrel  - hasn't been taking atorvastatin  for unknown reason; resume today - LDL 05/26/23 was 15  3: CKD Stage IIIB:  - BMET 11/23/23 reviewed: sodium 140, potassium 3.8, creatinine 1.25 & GFR >60 - BMET today - Continue jardiance  10mg  daily  4: Crohn's disease:  - Prior colectomy plus ostomy.  - saw GI 06/25   Return in 1 month, sooner if needed.   Clarence [name], [place] 12/22/23

## 2024-01-26 NOTE — Telephone Encounter (Signed)
 Called to confirm/remind patient of their appointment at the [facility] on 01/27/24.   Appointment:   [] Confirmed  [x] Left mess   [] No answer/No voice mail  [] VM Full/unable to leave message  [] Phone not in service  Patient reminded to bring all medications and/or complete list.  Confirmed patient has transportation. Gave directions, instructed to utilize valet parking.

## 2024-01-26 NOTE — Assessment & Plan Note (Signed)
 Will continue statin therapy

## 2024-01-26 NOTE — Assessment & Plan Note (Signed)
-   This is likely secondary to volume depletion associated with recurrent nausea and vomiting. - The patient will be admitted to a medical telemetry observation bed. - Will continue hydration with IV normal saline

## 2024-01-26 NOTE — Assessment & Plan Note (Addendum)
-   Will continue Coreg  with improved BP. - Will hold diuretic therapy and and Entresto  as well as Jardiance .

## 2024-01-26 NOTE — Progress Notes (Signed)
 Anticoagulation monitoring(Lovenox ):  53 yo male ordered Lovenox  40 mg Q24h    Filed Weights   01/26/24 1751  Weight: 110.2 kg (243 lb)   BMI 35   Lab Results  Component Value Date   CREATININE 2.25 (H) 01/26/2024   CREATININE 1.37 01/10/2024   CREATININE 1.72 (H) 12/30/2023   Estimated Creatinine Clearance: 46.1 mL/min (A) (by C-G formula based on SCr of 2.25 mg/dL (H)). Hemoglobin & Hematocrit     Component Value Date/Time   HGB 13.5 01/26/2024 1754   HGB 14.8 03/23/2023 1511   HCT 40.3 01/26/2024 1754   HCT 45.4 03/23/2023 1511     Per Protocol for Patient with estCrcl > 30 ml/min and BMI > 30, will transition to Lovenox  55 mg Q24h.

## 2024-01-26 NOTE — Assessment & Plan Note (Signed)
-   Will continue Imdur , Ranexa  and beta-blocker therapy as well as statin therapy.

## 2024-01-26 NOTE — ED Triage Notes (Signed)
 Pt ambulatory to triage.  Pt reports hypotension and diarrhea for several weeks. Pt has ileostomy.   Pt has nausea and dizzy today.    No chest pain or sob.   Pt alert  speech clear.

## 2024-01-26 NOTE — H&P (Signed)
 [place]   PATIENT NAME: Clarence [name]    MR#:  [id number]  DATE OF BIRTH:  01/22/1971  DATE OF ADMISSION:  01/26/2024  PRIMARY CARE PHYSICIAN: Ngetich, Roxan BROCKS, NP   Patient is coming from: Home  REQUESTING/REFERRING PHYSICIAN: [name] Cummins, MD  CHIEF COMPLAINT:   Chief Complaint  Patient presents with   Hypotension    HISTORY OF PRESENT ILLNESS:  Clarence [name] is a 53 y.o. male with medical history significant for coronary artery disease, HFrEF, stage II CKD, Crohn's disease, hypertension, mixed ischemic and nonischemic cardiomyopathy and pulmonary artery hypertension, who presented to the emergency room with acute onset of hypotension with associated exhaustion and fatigue.  He checked his blood pressure at home and it was 77/58 and 66/50.  He felt mild headache without dizziness or blurred vision or paresthesias or focal muscle weakness.  He admitted to mild diaphoresis has been having nausea and vomiting over the last couple of days without bilious vomitus or hematemesis.  No dysuria hematuria, oliguria or urinary frequency or urgency or flank pain.  He drank Kool-Aid today and his ileostomy bag was having pink and red-colored stools.  No melena or bright red bleeding.  No chest pain or palpitations.  No cough or wheezing or dyspnea.  ED Course: When the patient came to the ER, BP was 95/63 with heart rate of 92 and otherwise normal vital signs.  Labs revealed a blood glucose 145 and CO2 of 19 BUN of 38 and creatinine 2.25 compared to 12/1.37 on 01/10/2024.  LFTs were within normal.  Lipase was 70.  High-sensitivity Troponin I was 37 and later 39.  Lactic acid was 1.1.  CBC was normal.  INR was 1 and PT 13.8.  UA showed 6-10 WBCs with 30 protein and trace leukocytes and rare bacteria. EKG as reviewed by me :  EKG showed normal sinus rhythm with a rate of 85 with inferior Q waves and poor R wave progression. Imaging: Portable chest x-ray showed no acute cardiopulmonary  disease.  The patient was given 1 L bolus of IV normal saline.  He will be admitted to a medical telemetry observation bed for further evaluation and management. PAST MEDICAL HISTORY:   Past Medical History:  Diagnosis Date   Ascending Aortic Dilation    a. 09/2021 Echo: Asc Ao 40mm.   CAD (coronary artery disease)    a. 07/2021 Cath: LM nl, LAD 31m, D2 40, LCX 65p/m, OM1 90, RCA 174m w/ L-.R collats to RPDA from septal 1/2-->Med rx.   Chronic HFrEF (heart failure with reduced ejection fraction) (HCC)    a. 07/2021 Echo: EF 30-35%, glob HK, mod LVH, GrII DD, nl RV fxn, mildly dil LA, mild-mod MR; b. 09/2021 Echo: EF 35-40%, glob HK, mod LVH, GrI DD, nl RV fxn, mildly dil RA, mild MR, Asc Ao 40mm.   CKD (chronic kidney disease) stage 2, GFR 60-89 ml/min    Crohn's disease (HCC)    Hypertension    Mixed Ischemic & Nonischemic cardiomyopathy    a. 07/2021 Echo: EF 30-35%; b. 07/2021 Cath: Occluded RCA w/ mod LAD/LCX dzs, and severe OM1 dzs-->Med Rx; c. 09/2021 Echo: EF 35-40%.   PAH (pulmonary artery hypertension) (HCC)    a. 07/2021 RHC: PA 67/30 (42).   Proteinuria     PAST SURGICAL HISTORY:   Past Surgical History:  Procedure Laterality Date   COLECTOMY     CORONARY STENT INTERVENTION N/A 03/02/2023   Procedure: CORONARY  STENT INTERVENTION;  Surgeon: Mady Bruckner, MD;  Location: [facility] INVASIVE CV LAB;  Service: Cardiovascular;  Laterality: N/A;   LEFT HEART CATH AND CORONARY ANGIOGRAPHY Left 03/02/2023   Procedure: LEFT HEART CATH AND CORONARY ANGIOGRAPHY;  Surgeon: Mady Bruckner, MD;  Location: [facility] INVASIVE CV LAB;  Service: Cardiovascular;  Laterality: Left;   RIGHT/LEFT HEART CATH AND CORONARY ANGIOGRAPHY N/A 08/07/2021   Procedure: RIGHT/LEFT HEART CATH AND CORONARY ANGIOGRAPHY;  Surgeon: Mady Bruckner, MD;  Location: [facility] INVASIVE CV LAB;  Service: Cardiovascular;  Laterality: N/A;   RIGHT/LEFT HEART CATH AND CORONARY ANGIOGRAPHY N/A 09/29/2022   Procedure: RIGHT/LEFT HEART  CATH AND CORONARY ANGIOGRAPHY;  Surgeon: Rolan Ezra RAMAN, MD;  Location: [facility] INVASIVE CV LAB;  Service: Cardiovascular;  Laterality: N/A;    SOCIAL HISTORY:   Social History   Tobacco Use   Smoking status: Former    Types: Cigars    Quit date: 08/13/2021    Years since quitting: 2.4   Smokeless tobacco: Not on file  Substance Use Topics   Alcohol use: Not Currently    Alcohol/week: 1.0 standard drink of alcohol    Types: 1 Standard drinks or equivalent per week    FAMILY HISTORY:   Family History  Problem Relation Age of Onset   Hypertension Mother    Cancer Maternal Aunt    Heart disease Maternal Uncle    Pancreatic cancer Maternal Uncle     DRUG ALLERGIES:   Allergies  Allergen Reactions   Ceftriaxone  Rash    Macular rash with itchness   Ibuprofen Other (See Comments)    History of GIB with ibuprofen    Sulfacetamide Other (See Comments)    Pt can tolerate sulfacetamide 500mg     Neomycin-Bacitracin Zn-Polymyx     Other reaction(s): Other (See Comments)   Spironolactone      Hyperkalemia    REVIEW OF SYSTEMS:   ROS As per history of present illness. All pertinent systems were reviewed above. Constitutional, HEENT, cardiovascular, respiratory, GI, GU, musculoskeletal, neuro, psychiatric, endocrine, integumentary and hematologic systems were reviewed and are otherwise negative/unremarkable except for positive findings mentioned above in the HPI.   MEDICATIONS AT HOME:   Prior to Admission medications   Medication Sig Start Date End Date Taking? Authorizing Provider  aspirin  EC 81 MG tablet Take 81 mg by mouth daily. Swallow whole.    [provider]  atorvastatin  (LIPITOR ) 80 MG tablet Take 1 tablet (80 mg total) by mouth every evening. 10/20/23   Maree Hue, MD  carvedilol  (COREG ) 3.125 MG tablet Take 1 tablet (3.125 mg total) by mouth 2 (two) times daily. Patient taking differently: Take 6.25 mg by mouth daily. 10/20/23   Maree Hue, MD  clopidogrel   (PLAVIX ) 75 MG tablet Take 1 tablet (75 mg total) by mouth daily. 12/07/23 12/01/24  Donette Ellouise LABOR, [name]  empagliflozin  (JARDIANCE ) 10 MG TABS tablet Take 10 mg by mouth daily.    [provider]  famotidine  (PEPCID ) 40 MG tablet Take 1 tablet (40 mg total) by mouth every evening. 01/25/24 01/24/25  Mayo, Darice LABOR, [name]  furosemide  (LASIX ) 20 MG tablet Take 1 tablet (20 mg total) by mouth daily as needed. For weight gain of 3 lb in 24 hours or 5 lbs in one week 10/20/23   Maree Hue, MD  isosorbide  mononitrate (IMDUR ) 60 MG 24 hr tablet Take 60 mg by mouth. Take 1 tab every morning and 1/2 tab every evening    [provider]  ranolazine  (RANEXA ) 500 MG  12 hr tablet Take 500 mg by mouth 2 (two) times daily.    [provider]  sacubitril -valsartan  (ENTRESTO ) 24-26 MG Take 1 tablet by mouth 2 (two) times daily. 12/28/23   Donette Ellouise LABOR, [name]      VITAL SIGNS:  Blood pressure 120/76, pulse 69, temperature 98 F (36.7 C), temperature source Oral, resp. rate 19, height 5' 10 (1.778 m), weight 110.2 kg, SpO2 100%.  PHYSICAL EXAMINATION:  Physical Exam  GENERAL:  53 y.o.-year-old male patient lying in the bed with no acute distress.  EYES: Pupils equal, round, reactive to light and accommodation. No scleral icterus. Extraocular muscles intact.  HEENT: Head atraumatic, normocephalic. Oropharynx and nasopharynx clear.  NECK:  Supple, no jugular venous distention. No thyroid  enlargement, no tenderness.  LUNGS: Normal breath sounds bilaterally, no wheezing, rales,rhonchi or crepitation. No use of accessory muscles of respiration.  CARDIOVASCULAR: Regular rate and rhythm, S1, S2 normal. No murmurs, rubs, or gallops.  ABDOMEN: Soft, nondistended, nontender. Bowel sounds present. No organomegaly or mass.  Ileostomy in place with reddish stools. EXTREMITIES: No pedal edema, cyanosis, or clubbing.  NEUROLOGIC: Cranial nerves II through XII are intact. Muscle strength 5/5 in all  extremities. Sensation intact. Gait not checked.  PSYCHIATRIC: The patient is alert and oriented x 3.  Normal affect and good eye contact. SKIN: No obvious rash, lesion, or ulcer.   LABORATORY PANEL:   CBC Recent Labs  Lab 01/26/24 1754  WBC 9.4  HGB 13.5  HCT 40.3  PLT 270   ------------------------------------------------------------------------------------------------------------------  Chemistries  Recent Labs  Lab 01/26/24 1754 01/26/24 2033  NA 138  --   K 4.3  --   CL 110  --   CO2 19*  --   GLUCOSE 145*  --   BUN 38*  --   CREATININE 2.25*  --   CALCIUM  9.1  --   AST  --  27  ALT  --  41  ALKPHOS  --  110  BILITOT  --  1.0   ------------------------------------------------------------------------------------------------------------------  Cardiac Enzymes No results for input(s): TROPONINI in the last 168 hours. ------------------------------------------------------------------------------------------------------------------  RADIOLOGY:  DG Chest 1 View Result Date: 01/26/2024 EXAM: 1 VIEW XRAY OF THE CHEST 01/26/2024 09:11:00 PM COMPARISON: 11/11/2023 CLINICAL HISTORY: Chest burning. The patient states chest burning, hypotension, diarrhea, nausea, and dizziness. History of CAD, HTN, and former smoker. FINDINGS: LUNGS AND PLEURA: No focal pulmonary opacity. No pulmonary edema. No pleural effusion. No pneumothorax. HEART AND MEDIASTINUM: No acute abnormality of the cardiac and mediastinal silhouettes. BONES AND SOFT TISSUES: No acute osseous abnormality. IMPRESSION: 1. No acute process. Electronically signed by: Norman Gatlin MD 01/26/2024 09:21 PM EDT RP Workstation: [id number]   CT ABDOMEN PELVIS WO CONTRAST Result Date: 01/26/2024 CLINICAL DATA:  Diarrhea. EXAM: CT ABDOMEN AND PELVIS WITHOUT CONTRAST TECHNIQUE: Multidetector CT imaging of the abdomen and pelvis was performed following the standard protocol without IV contrast. RADIATION DOSE REDUCTION: This  exam was performed according to the departmental dose-optimization program which includes automated exposure control, adjustment of the mA and/or kV according to patient size and/or use of iterative reconstruction technique. COMPARISON:  CT abdomen pelvis dated 10/18/2023. FINDINGS: Evaluation of this exam is limited in the absence of intravenous contrast. Lower chest: The visualized lung bases are clear. No intra-abdominal free air or free fluid. Hepatobiliary: The liver is unremarkable. No biliary dilatation. The gallbladder is unremarkable. Pancreas: Unremarkable. No pancreatic ductal dilatation or surrounding inflammatory changes. Spleen: Normal in size without focal abnormality. Adrenals/Urinary Tract:  The adrenal glands unremarkable. There is no hydronephrosis or nephrolithiasis on either side. Subcentimeter exophytic right renal inferior pole lesion is not characterized on this CT, likely a cyst. The visualized ureters and urinary bladder appear unremarkable. 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 There is a small parastomal hernia containing a loop of small bowel. There is no bowel obstruction or active inflammation. Appendectomy. Vascular/Lymphatic: The abdominal aorta and IVC unremarkable no portal venous gas. There is no adenopathy. Reproductive: The prostate and seminal vesicles are grossly unremarkable. Other: None Musculoskeletal: No acute or significant osseous findings. IMPRESSION: 1. No acute intra-abdominal or pelvic pathology. 2. 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 No bowel obstruction. Electronically Signed   By: Vanetta Chou M.D.   On: 01/26/2024 21:20      IMPRESSION AND PLAN:  Assessment and Plan: Dehydration - This is likely secondary to volume depletion associated with recurrent nausea and vomiting. - The patient will be admitted to a medical telemetry observation bed. - Will continue  hydration with IV normal saline  Dyslipidemia - Will continue statin therapy.  Chronic HFrEF (heart failure with reduced ejection fraction) (HCC) - Will continue Coreg  with improved BP. - Will hold diuretic therapy and and Entresto  as well as Jardiance .  Coronary artery disease - Will continue Imdur , Ranexa  and beta-blocker therapy as well as statin therapy.    DVT prophylaxis: Lovenox .  Advanced Care Planning:  Code Status: full code.  Family Communication:  The plan of care was discussed in details with the patient (and family). I answered all questions. The patient agreed to proceed with the above mentioned plan. Further management will depend upon hospital course. Disposition Plan: Back to previous home environment Consults called: none.  All the records are reviewed and case discussed with ED provider.  Status is: Observation  At the time of the admission, it appears that the appropriate admission status for this patient is inpatient.  This is judged to be reasonable and necessary in order to provide the required intensity of service to ensure the patient's safety given the presenting symptoms, physical exam findings and initial radiographic and laboratory data in the context of comorbid conditions.  The patient requires inpatient status due to high intensity of service, high risk of further deterioration and high frequency of surveillance required.  I certify that at the time of admission, it is my clinical judgment that the patient will require inpatient hospital care extending more than 2 midnights.                            Dispo: The patient is from: Home              Anticipated d/c is to: Home              Patient currently is not medically stable to d/c.              Difficult to place patient: No  Madison DELENA Peaches M.D on 01/26/2024 at 11:14 PM  [facility]   From 7 PM-7 AM, contact night-coverage [email or web address]  CC: Primary care physician; Ngetich, Dinah C,  NP

## 2024-01-26 NOTE — ED Provider Notes (Signed)
 Clarence [name] Provider Note    Event Date/Time   First MD Initiated Contact with Patient 01/26/24 2022     (approximate)   History   Hypotension   HPI  Clarence [name] is a 53 y.o. male with history of CAD, CHF, CKD, Crohn's disease status post ileostomy, presenting with diarrhea as well as low blood pressures.  States that diarrhea has been ongoing for several weeks, has an ileostomy.  Notes nausea but no vomiting, felt very lightheaded today.  States that when he felt very lightheaded, took his blood pressure and it was systolic 60s to 70s.  No chest pain or shortness of breath, no fevers.  Stated he has some acid reflux symptoms and chest burning earlier but none now.  No urinary symptoms.  He denies any recent antibiotic use.  States that he drank a gallon of red Kool-Aid this morning and noticed that his stool is red now.  On independent chart review, he was seen in the end of August by GI for Crohn's disease, has been having nausea and high output from his ostomy, had a CT does done in June that did not show acute changes, C. difficile and GI pathogen's were negative.  He was started on a fiber supplement and given Imodium  as needed for high output.     Physical Exam   Triage Vital Signs: ED Triage Vitals  Encounter Vitals Group     BP 01/26/24 1750 95/63     Girls Systolic BP Percentile --      Girls Diastolic BP Percentile --      Boys Systolic BP Percentile --      Boys Diastolic BP Percentile --      Pulse Rate 01/26/24 1750 92     Resp 01/26/24 1750 18     Temp 01/26/24 1750 98 F (36.7 C)     Temp Source 01/26/24 1750 Oral     SpO2 01/26/24 1750 98 %     Weight 01/26/24 1751 243 lb (110.2 kg)     Height 01/26/24 1751 5' 10 (1.778 m)     Head Circumference --      Peak Flow --      Pain Score 01/26/24 1751 0     Pain Loc --      Pain Education --      Exclude from Growth Chart --     Most recent vital signs: Vitals:   01/26/24  1750 01/26/24 2030  BP: 95/63 106/68  Pulse: 92 80  Resp: 18 18  Temp: 98 F (36.7 C)   SpO2: 98% 100%     General: Awake, no distress.  CV:  Good peripheral perfusion.  Resp:  Normal effort.  No tachypnea or respiratory distress Abd:  No distention.  Soft nontender, ileostomy in place to the right lower quadrant with red stool. Other:  Stool is Hemoccult negative.  Dry mucous membranes.   ED Results / Procedures / Treatments   Labs (all labs ordered are listed, but only abnormal results are displayed) Labs Reviewed  BASIC METABOLIC PANEL WITH GFR - Abnormal; Notable for the following components:      Result Value   CO2 19 (*)    Glucose, Bld 145 (*)    BUN 38 (*)    Creatinine, Ser 2.25 (*)    GFR, Estimated 34 (*)    All other components within normal limits  LIPASE, BLOOD - Abnormal; Notable for the following components:  Lipase 70 (*)    All other components within normal limits  URINALYSIS, ROUTINE W REFLEX MICROSCOPIC - Abnormal; Notable for the following components:   Color, Urine AMBER (*)    APPearance CLOUDY (*)    Glucose, UA 50 (*)    Protein, ur 30 (*)    Leukocytes,Ua TRACE (*)    Bacteria, UA RARE (*)    All other components within normal limits  TROPONIN I (HIGH SENSITIVITY) - Abnormal; Notable for the following components:   Troponin I (High Sensitivity) 37 (*)    All other components within normal limits  TROPONIN I (HIGH SENSITIVITY) - Abnormal; Notable for the following components:   Troponin I (High Sensitivity) 39 (*)    All other components within normal limits  CBC  PROTIME-INR  LACTIC ACID, PLASMA  HEPATIC FUNCTION PANEL  LACTIC ACID, PLASMA     EKG  EKG shows, sinus rhythm, rate 85, normal QS, normal QTc, no obvious ischemic ST elevation, T wave flattening to lateral leads, not significant compared to prior   RADIOLOGY On my independent interpretation, CT without obvious colitis   PROCEDURES:  Critical Care performed:  No  Procedures   MEDICATIONS ORDERED IN ED: Medications  sodium chloride  0.9 % bolus 1,000 mL (1,000 mLs Intravenous New Bag/Given 01/26/24 2042)     IMPRESSION / MDM / ASSESSMENT AND PLAN / ED COURSE  I reviewed the triage vital signs and the nursing notes.                              Differential diagnosis includes, but is not limited to, electrolyte derangements, AKI, dehydration, arrhythmia, atypical ACS, colitis, diverticulitis.  Will get labs, EKG, lactic acid, CT abdomen pelvis, IV fluids.  Repeat systolic blood pressures here in the 100s.  Patient's presentation is most consistent with acute presentation with potential threat to life or bodily function.  Independent interpretation of labs and imaging below.  Given AKI, profuse diarrhea with dehydration, he needs to be admitted for further management and IV fluids.  Consulted hospitalist who is agreeable with the plan for admission and will evaluate the patient.  He is admitted.  The patient is on the cardiac monitor to evaluate for evidence of arrhythmia and/or significant heart rate changes.   Clinical Course as of 01/26/24 2136  Wed Jan 26, 2024  2028 Independent review of labs, UA shows trace leukocytes, 16 of both WBCs and rare bacteria, he has no urinary symptoms at this time, will hold off treating him for UTI, he does have an AKI, electrolytes not severely deranged, troponin is mildly elevated, lipase is mildly elevated.  LFTs are not elevated, lactate is normal. [TT]  2123 CT ABDOMEN PELVIS WO CONTRAST IMPRESSION: 1. No acute intra-abdominal or pelvic pathology. 2. 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 No bowel obstruction.   [TT]  2135 DG Chest 1 View 1. No acute process.  [TT]    Clinical Course User Index [TT] Clarence, [name] Cummins, MD     FINAL CLINICAL IMPRESSION(S) / ED DIAGNOSES   Final diagnoses:  Dehydration  Diarrhea, unspecified type  AKI (acute kidney injury) (HCC)   Lightheadedness     Rx / DC Orders   ED Discharge Orders     None        Note:  This document was prepared using Dragon voice recognition software and may include unintentional dictation errors.    Clarence [name]  Jerri, MD 01/26/24 2136

## 2024-01-27 ENCOUNTER — Encounter: Admitting: Family

## 2024-01-27 DIAGNOSIS — E860 Dehydration: Secondary | ICD-10-CM | POA: Diagnosis not present

## 2024-01-27 DIAGNOSIS — N179 Acute kidney failure, unspecified: Secondary | ICD-10-CM | POA: Diagnosis not present

## 2024-01-27 LAB — CBC
HCT: 37.4 % — ABNORMAL LOW (ref 39.0–52.0)
Hemoglobin: 12.5 g/dL — ABNORMAL LOW (ref 13.0–17.0)
MCH: 27.7 pg (ref 26.0–34.0)
MCHC: 33.4 g/dL (ref 30.0–36.0)
MCV: 82.7 fL (ref 80.0–100.0)
Platelets: 207 K/uL (ref 150–400)
RBC: 4.52 MIL/uL (ref 4.22–5.81)
RDW: 15.3 % (ref 11.5–15.5)
WBC: 8 K/uL (ref 4.0–10.5)
nRBC: 0 % (ref 0.0–0.2)

## 2024-01-27 LAB — BASIC METABOLIC PANEL WITH GFR
Anion gap: 6 (ref 5–15)
BUN: 34 mg/dL — ABNORMAL HIGH (ref 6–20)
CO2: 19 mmol/L — ABNORMAL LOW (ref 22–32)
Calcium: 8.7 mg/dL — ABNORMAL LOW (ref 8.9–10.3)
Chloride: 114 mmol/L — ABNORMAL HIGH (ref 98–111)
Creatinine, Ser: 1.89 mg/dL — ABNORMAL HIGH (ref 0.61–1.24)
GFR, Estimated: 41 mL/min — ABNORMAL LOW (ref >60)
Glucose, Bld: 108 mg/dL — ABNORMAL HIGH (ref 70–99)
Potassium: 4 mmol/L (ref 3.5–5.1)
Sodium: 139 mmol/L (ref 135–145)

## 2024-01-27 LAB — PHOSPHORUS: Phosphorus: 3.1 mg/dL (ref 2.5–4.6)

## 2024-01-27 LAB — FOLATE: Folate: 10.1 ng/mL (ref >5.9)

## 2024-01-27 LAB — IRON AND TIBC
Iron: 71 ug/dL (ref 45–182)
Saturation Ratios: 27 % (ref 17.9–39.5)
TIBC: 262 ug/dL (ref 250–450)
UIBC: 191 ug/dL

## 2024-01-27 LAB — MAGNESIUM: Magnesium: 2.1 mg/dL (ref 1.7–2.4)

## 2024-01-27 LAB — VITAMIN D 25 HYDROXY (VIT D DEFICIENCY, FRACTURES): Vit D, 25-Hydroxy: 46.5 ng/mL (ref 30–100)

## 2024-01-27 LAB — TSH: TSH: 0.955 u[IU]/mL (ref 0.350–4.500)

## 2024-01-27 MED ORDER — SODIUM CHLORIDE 0.9 % IV SOLN: INTRAVENOUS | Status: AC

## 2024-01-27 MED ORDER — SODIUM BICARBONATE 8.4 % IV SOLN
100.0000 meq | Freq: Once | INTRAVENOUS | Status: AC
  Administered 2024-01-27: 100 meq via INTRAVENOUS
  Filled 2024-01-27: qty 50

## 2024-01-27 MED ORDER — PANTOPRAZOLE SODIUM 40 MG PO TBEC
40.0000 mg | DELAYED_RELEASE_TABLET | Freq: Every day | ORAL | Status: DC
  Administered 2024-01-27 – 2024-01-28 (×2): 40 mg via ORAL
  Filled 2024-01-27 (×2): qty 1

## 2024-01-27 MED ORDER — CARVEDILOL 3.125 MG PO TABS
3.1250 mg | ORAL_TABLET | Freq: Two times a day (BID) | ORAL | Status: DC
  Administered 2024-01-28: 3.125 mg via ORAL
  Filled 2024-01-27: qty 1

## 2024-01-27 NOTE — Progress Notes (Signed)
 [facility] Progress Note  Patient: Clarence [name]    FMW:[id number]  DOA: 01/26/2024     Date of Service: the patient was seen and examined on 01/27/2024  Chief Complaint  Patient presents with   Hypotension   Brief hospital course: Clarence [name] is a 53 y.o. male with medical history significant for coronary artery disease, HFrEF, stage II CKD, Crohn's disease, hypertension, mixed ischemic and nonischemic cardiomyopathy and pulmonary artery hypertension, who presented to the emergency room with acute onset of hypotension with associated exhaustion and fatigue.  He checked his blood pressure at home and it was 77/58 and 66/50.  He felt mild headache without dizziness or blurred vision or paresthesias or focal muscle weakness.  He admitted to mild diaphoresis has been having nausea and vomiting over the last couple of days without bilious vomitus or hematemesis.  No dysuria hematuria, oliguria or urinary frequency or urgency or flank pain.  He drank Kool-Aid today and his ileostomy bag was having pink and red-colored stools.  No melena or bright red bleeding.  No chest pain or palpitations.  No cough or wheezing or dyspnea.   ED Course: When the patient came to the ER, BP was 95/63 with heart rate of 92 and otherwise normal vital signs.  Labs revealed a blood glucose 145 and CO2 of 19 BUN of 38 and creatinine 2.25 compared to 12/1.37 on 01/10/2024.  LFTs were within normal.  Lipase was 70.  High-sensitivity Troponin I was 37 and later 39.  Lactic acid was 1.1.  CBC was normal.  INR was 1 and PT 13.8.  UA showed 6-10 WBCs with 30 protein and trace leukocytes and rare bacteria. EKG as reviewed by me :  EKG showed normal sinus rhythm with a rate of 85 with inferior Q waves and poor R wave progression. Imaging: Portable chest x-ray showed no acute cardiopulmonary disease.   The patient was given 1 L bolus of IV normal saline.  He will be admitted to a medical telemetry observation bed for  further evaluation and management.  Assessment and Plan:  # AKI secondary to dehydration # Dehydration secondary to recurrent nausea and vomiting Baseline sCr 1.13--1.37, eGFR >60, CKD stage II Serum creatinine 2.25 on admission sCr 1.89 >> gradually improved Continue IV fluid for hydration 9/11 bicarb 19, metabolic acidosis due to renal failure.   Bicarb 100 mEq IV push given Check renal functions daily and urine output Avoid nephrotoxic medication, urinary medications    # Chronic systolic CHF, euvolemic, Continue Coreg  3.125 mg p.o. twice daily,  - Will continue Coreg  with improved BP. - hold Lasix  20 mg, Entresto  and Jardiance  due to AKI and low blood pressure Monitor renal functions and vitals and restart medications as needed    # Coronary artery disease - continue Imdur , Ranexa , Coreg , plavix  and statin  # Dyslipidemia: Lipitor  80 mg nightly  Body mass index is 34.87 kg/m.  Interventions:  Diet: Heart healthy diet DVT Prophylaxis: Subcutaneous Lovenox    Advance goals of care discussion: Full code  Family Communication: family was not present at bedside, at the time of interview.  The pt provided permission to discuss medical plan with the family. Opportunity was given to ask question and all questions were answered satisfactorily.   Disposition:  Pt is from home, admitted with AKI, dehydration, still has elevated creatinine on IV fluids, which precludes a safe discharge. Discharge to home, when stable, may need 1-2 more days to improve.  Subjective: No significant events  overnight, overall patient is feeling better after IV fluid given with hydration, still feels little bit lightheaded while getting up and walk.  Denied any chest pain or palpitation, no shortness of breath.   Physical Exam: General: NAD, lying comfortably Appear in no distress, affect appropriate Eyes: PERRLA ENT: Oral Mucosa Clear, moist  Neck: no JVD,  Cardiovascular: S1 and S2 Present,  no Murmur,  Respiratory: good respiratory effort, Bilateral Air entry equal and Decreased, no Crackles, no wheezes Abdomen: BS present, Soft and no tenderness, ileostomy intact Skin: no rashes Extremities: no Pedal edema, no calf tenderness Neurologic: without any new focal findings Gait not checked due to patient safety concerns  Vitals:   01/27/24 1130 01/27/24 1200 01/27/24 1230 01/27/24 1330  BP: 109/66 122/79 111/81 114/71  Pulse: 82 76 70 74  Resp: 19 20 17 17   Temp:      TempSrc:      SpO2: 100% 100% 100% 100%  Weight:      Height:       No intake or output data in the 24 hours ending 01/27/24 1402 Filed Weights   01/26/24 1751  Weight: 110.2 kg    Data Reviewed: I have personally reviewed and interpreted daily labs, tele strips, imagings as discussed above. I reviewed all nursing notes, pharmacy notes, vitals, pertinent old records I have discussed plan of care as described above with RN and patient/family.  CBC: Recent Labs  Lab 01/26/24 1754 01/27/24 0512  WBC 9.4 8.0  HGB 13.5 12.5*  HCT 40.3 37.4*  MCV 83.8 82.7  PLT 270 207   Basic Metabolic Panel: Recent Labs  Lab 01/26/24 1754 01/27/24 0511 01/27/24 0512  NA 138  --  139  K 4.3  --  4.0  CL 110  --  114*  CO2 19*  --  19*  GLUCOSE 145*  --  108*  BUN 38*  --  34*  CREATININE 2.25*  --  1.89*  CALCIUM  9.1  --  8.7*  MG  --  2.1  --   PHOS  --  3.1  --     Studies: DG Chest 1 View Result Date: 01/26/2024 EXAM: 1 VIEW XRAY OF THE CHEST 01/26/2024 09:11:00 PM COMPARISON: 11/11/2023 CLINICAL HISTORY: Chest burning. The patient states chest burning, hypotension, diarrhea, nausea, and dizziness. History of CAD, HTN, and former smoker. FINDINGS: LUNGS AND PLEURA: No focal pulmonary opacity. No pulmonary edema. No pleural effusion. No pneumothorax. HEART AND MEDIASTINUM: No acute abnormality of the cardiac and mediastinal silhouettes. BONES AND SOFT TISSUES: No acute osseous abnormality. IMPRESSION:  1. No acute process. Electronically signed by: Norman Gatlin MD 01/26/2024 09:21 PM EDT RP Workstation: [id number]   CT ABDOMEN PELVIS WO CONTRAST Result Date: 01/26/2024 CLINICAL DATA:  Diarrhea. EXAM: CT ABDOMEN AND PELVIS WITHOUT CONTRAST TECHNIQUE: Multidetector CT imaging of the abdomen and pelvis was performed following the standard protocol without IV contrast. RADIATION DOSE REDUCTION: This exam was performed according to the departmental dose-optimization program which includes automated exposure control, adjustment of the mA and/or kV according to patient size and/or use of iterative reconstruction technique. COMPARISON:  CT abdomen pelvis dated 10/18/2023. FINDINGS: Evaluation of this exam is limited in the absence of intravenous contrast. Lower chest: The visualized lung bases are clear. No intra-abdominal free air or free fluid. Hepatobiliary: The liver is unremarkable. No biliary dilatation. The gallbladder is unremarkable. Pancreas: Unremarkable. No pancreatic ductal dilatation or surrounding inflammatory changes. Spleen: Normal in size without focal abnormality. Adrenals/Urinary Tract: The  adrenal glands unremarkable. There is no hydronephrosis or nephrolithiasis on either side. Subcentimeter exophytic right renal inferior pole lesion is not characterized on this CT, likely a cyst. The visualized ureters and urinary bladder appear unremarkable. 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 There is a small parastomal hernia containing a loop of small bowel. There is no bowel obstruction or active inflammation. Appendectomy. Vascular/Lymphatic: The abdominal aorta and IVC unremarkable no portal venous gas. There is no adenopathy. Reproductive: The prostate and seminal vesicles are grossly unremarkable. Other: None Musculoskeletal: No acute or significant osseous findings. IMPRESSION: 1. No acute intra-abdominal or pelvic pathology. 2. 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 No bowel obstruction. Electronically Signed   By: Vanetta Chou M.D.   On: 01/26/2024 21:20    Scheduled Meds:  atorvastatin   80 mg Oral QPM   carvedilol   6.25 mg Oral Q breakfast   clopidogrel   75 mg Oral Daily   enoxaparin  (LOVENOX ) injection  0.5 mg/kg Subcutaneous Q24H   isosorbide  mononitrate  60 mg Oral Daily   pantoprazole   40 mg Oral Daily   ranolazine   500 mg Oral BID   Continuous Infusions:  sodium chloride  75 mL/hr at 01/27/24 0829   PRN Meds: acetaminophen  **OR** acetaminophen , magnesium  hydroxide, ondansetron  **OR** ondansetron  (ZOFRAN ) IV, traZODone   Time spent: 55 minutes  Author: ELVAN SOR. MD [facility] 01/27/2024 2:02 PM  To reach On-call, see care teams to locate the attending and reach out to them via [email or web address]. If 7PM-7AM, please contact night-coverage If you still have difficulty reaching the attending provider, please page the [facility] (Director on Call) for [facility] on amion for assistance.

## 2024-01-28 ENCOUNTER — Encounter: Payer: Self-pay | Admitting: Family Medicine

## 2024-01-28 DIAGNOSIS — E860 Dehydration: Secondary | ICD-10-CM | POA: Diagnosis not present

## 2024-01-28 DIAGNOSIS — N179 Acute kidney failure, unspecified: Secondary | ICD-10-CM | POA: Diagnosis not present

## 2024-01-28 LAB — CBC
HCT: 36.6 % — ABNORMAL LOW (ref 39.0–52.0)
Hemoglobin: 12.3 g/dL — ABNORMAL LOW (ref 13.0–17.0)
MCH: 27.8 pg (ref 26.0–34.0)
MCHC: 33.6 g/dL (ref 30.0–36.0)
MCV: 82.6 fL (ref 80.0–100.0)
Platelets: 217 K/uL (ref 150–400)
RBC: 4.43 MIL/uL (ref 4.22–5.81)
RDW: 14.9 % (ref 11.5–15.5)
WBC: 7.6 K/uL (ref 4.0–10.5)
nRBC: 0 % (ref 0.0–0.2)

## 2024-01-28 LAB — BASIC METABOLIC PANEL WITH GFR
Anion gap: 4 — ABNORMAL LOW (ref 5–15)
BUN: 23 mg/dL — ABNORMAL HIGH (ref 6–20)
CO2: 23 mmol/L (ref 22–32)
Calcium: 8.9 mg/dL (ref 8.9–10.3)
Chloride: 117 mmol/L — ABNORMAL HIGH (ref 98–111)
Creatinine, Ser: 1.35 mg/dL — ABNORMAL HIGH (ref 0.61–1.24)
GFR, Estimated: >60 mL/min (ref >60)
Glucose, Bld: 97 mg/dL (ref 70–99)
Potassium: 4.1 mmol/L (ref 3.5–5.1)
Sodium: 144 mmol/L (ref 135–145)

## 2024-01-28 LAB — PHOSPHORUS: Phosphorus: 2.7 mg/dL (ref 2.5–4.6)

## 2024-01-28 LAB — VITAMIN B12: Vitamin B-12: 261 pg/mL (ref 180–914)

## 2024-01-28 LAB — MAGNESIUM: Magnesium: 2.1 mg/dL (ref 1.7–2.4)

## 2024-01-28 NOTE — TOC CM/SW Note (Signed)
 Transition of Care [facility]) - Inpatient Brief Assessment   Patient Details  Name: Clarence [name] MRN: [id number] Date of Birth: 1971/02/19  Transition of Care [facility]) CM/SW Contact:    Lauraine JAYSON Carpen, LCSW Phone Number: 01/28/2024, 1:14 PM   Clinical Narrative: Patient has orders to discharge home today. Chart reviewed. CSW acknowledges consult for home health/DME needs and medication assistance. Per RN, patient has been walking independently. Patient has Medicare and Medicaid insurances. No further concerns. CSW signing off.  Transition of Care Asessment: Insurance and Status: Insurance coverage has been reviewed Patient has primary care physician: Yes Home environment has been reviewed: Single family home Prior level of function:: Not documented Prior/Current Home Services: No current home services Social Drivers of Health Review: SDOH reviewed no interventions necessary Readmission risk has been reviewed: Yes Transition of care needs: no transition of care needs at this time

## 2024-01-28 NOTE — Discharge Summary (Signed)
 [facility] Discharge Summary   Patient: Clarence [name] FMW:[id number]  PCP: Leonarda Roxan BROCKS, NP  Date of admission: 01/26/2024   Date of discharge:  01/28/2024     Discharge Diagnoses:  Active Problems:   Dehydration   AKI (acute kidney injury) (HCC)   Dyslipidemia   Chronic HFrEF (heart failure with reduced ejection fraction) (HCC)   Coronary artery disease   Admitted From: Home Disposition:  Home   Recommendations for Outpatient Follow-up:  F/u with PCP in 1 week Follow up LABS/TEST:  Repeat BMP in 1 week   Follow-up Information     Ngetich, Dinah C, NP Follow up in 1 week(s).   Specialty: Family Medicine Contact information: [address] [place] [address] [phone]                Diet recommendation: Cardiac diet  Activity: The patient is advised to gradually reintroduce usual activities, as tolerated  Discharge Condition: stable  Code Status: Full code   History of present illness: As per the H and P dictated on admission  Hospital Course:  Clarence [name] is a 53 y.o. male with medical history significant for coronary artery disease, HFrEF, stage II CKD, Crohn's disease, hypertension, mixed ischemic and nonischemic cardiomyopathy and pulmonary artery hypertension, who presented to the emergency room with acute onset of hypotension with associated exhaustion and fatigue.  He checked his blood pressure at home and it was 77/58 and 66/50.  He felt mild headache without dizziness or blurred vision or paresthesias or focal muscle weakness.  He admitted to mild diaphoresis has been having nausea and vomiting over the last couple of days without bilious vomitus or hematemesis.  No dysuria hematuria, oliguria or urinary frequency or urgency or flank pain.  He drank Kool-Aid today and his ileostomy bag was having pink and red-colored stools.  No melena or bright red bleeding.  No chest pain or palpitations.  No cough or wheezing or dyspnea.   ED  Course: When the patient came to the ER, BP was 95/63 with heart rate of 92 and otherwise normal vital signs.  Labs revealed a blood glucose 145 and CO2 of 19 BUN of 38 and creatinine 2.25 compared to 12/1.37 on 01/10/2024.  LFTs were within normal.  Lipase was 70.  High-sensitivity Troponin I was 37 and later 39.  Lactic acid was 1.1.  CBC was normal.  INR was 1 and PT 13.8.  UA showed 6-10 WBCs with 30 protein and trace leukocytes and rare bacteria. EKG as reviewed by me :  EKG showed normal sinus rhythm with a rate of 85 with inferior Q waves and poor R wave progression. Imaging: Portable chest x-ray showed no acute cardiopulmonary disease.   The patient was given 1 L bolus of IV normal saline.  He will be admitted to a medical telemetry observation bed for further evaluation and management.   Assessment and Plan:   # AKI secondary to dehydration # Dehydration secondary to recurrent nausea and vomiting Baseline sCr 1.13--1.37, eGFR >60, CKD stage II Serum creatinine 2.25 on admission sCr 1.89 >> 135 gradually improved, S/p IV fluid for hydration 9/11 bicarb 19, metabolic acidosis due to renal failure.   Bicarb 100 mEq IV push given. Bicarb 23 improved   # Chronic systolic CHF, euvolemic, Continue Coreg  3.125 mg p.o. twice daily,  - hold Lasix  20 mg, Entresto  and Jardiance  due to AKI and low blood pressure during hospital stay.  BP improved and AKI resolved.  Home medications resumed.  Patient was advised to monitor BP at home.  Follow with PCP and recheck BMP after 1 week.    # Coronary artery disease: continue Imdur , Ranexa , Coreg , plavix  and statin # Dyslipidemia: Lipitor  80 mg nightly  Body mass index is 34.87 kg/m.  Nutrition Interventions:  Patient was ambulatory without any assistance. On the day of the discharge the patient's vitals were stable, and no other acute medical condition were reported by patient. the patient was felt safe to be discharge at Home.  Consultants:  None Procedures: None  Discharge Exam: General: Appear in no distress, Oral Mucosa Clear, moist. Cardiovascular: S1 and S2 Present, no Murmur, Respiratory: normal respiratory effort, Bilateral Air entry present and no Crackles, no wheezes Abdomen: Bowel Sound present, Soft and no tenderness.  Extremities: no Pedal edema, no calf tenderness Neurology: alert and oriented to time, place, and person affect appropriate.  Filed Weights   01/26/24 1751  Weight: 110.2 kg   Vitals:   01/28/24 0800 01/28/24 1200  BP: (!) 144/104 110/70  Pulse: 77 68  Resp: 19 16  Temp: 98 F (36.7 C) 97.8 F (36.6 C)  SpO2: 99% 99%    DISCHARGE MEDICATION: Allergies as of 01/28/2024       Reactions   Ceftriaxone  Rash   Macular rash with itchness   Ibuprofen Other (See Comments)   History of GIB with ibuprofen    Sulfacetamide Other (See Comments)   Pt can tolerate sulfacetamide 500mg     Neomycin-bacitracin Zn-polymyx    Other reaction(s): Other (See Comments)   Spironolactone     Hyperkalemia        Medication List     STOP taking these medications    aspirin  EC 81 MG tablet       TAKE these medications    atorvastatin  80 MG tablet Commonly known as: LIPITOR  Take 1 tablet (80 mg total) by mouth every evening.   carvedilol  3.125 MG tablet Commonly known as: COREG  Take 1 tablet (3.125 mg total) by mouth 2 (two) times daily. What changed:  how much to take when to take this   clopidogrel  75 MG tablet Commonly known as: PLAVIX  Take 1 tablet (75 mg total) by mouth daily.   Entresto  24-26 MG Generic drug: sacubitril -valsartan  Take 1 tablet by mouth 2 (two) times daily.   famotidine  40 MG tablet Commonly known as: PEPCID  Take 1 tablet (40 mg total) by mouth every evening.   furosemide  20 MG tablet Commonly known as: LASIX  Take 1 tablet (20 mg total) by mouth daily as needed. For weight gain of 3 lb in 24 hours or 5 lbs in one week   isosorbide  mononitrate 60 MG 24 hr  tablet Commonly known as: IMDUR  Take 60 mg by mouth. Take 1 tab every morning and 1/2 tab every evening   Jardiance  10 MG Tabs tablet Generic drug: empagliflozin  Take 10 mg by mouth daily.   ranolazine  500 MG 12 hr tablet Commonly known as: RANEXA  Take 500 mg by mouth 2 (two) times daily.       Allergies  Allergen Reactions   Ceftriaxone  Rash    Macular rash with itchness   Ibuprofen Other (See Comments)    History of GIB with ibuprofen    Sulfacetamide Other (See Comments)    Pt can tolerate sulfacetamide 500mg     Neomycin-Bacitracin Zn-Polymyx     Other reaction(s): Other (See Comments)   Spironolactone      Hyperkalemia   Discharge Instructions  Call MD for:  difficulty breathing, headache or visual disturbances   Complete by: As directed    Call MD for:  extreme fatigue   Complete by: As directed    Call MD for:  persistant dizziness or light-headedness   Complete by: As directed    Call MD for:  persistant nausea and vomiting   Complete by: As directed    Call MD for:  severe uncontrolled pain   Complete by: As directed    Call MD for:  temperature >100.4   Complete by: As directed    Diet - low sodium heart healthy   Complete by: As directed    Discharge instructions   Complete by: As directed    F/u with PCP in 1 week Repeat BMP in 1 week   Increase activity slowly   Complete by: As directed        The results of significant diagnostics from this hospitalization (including imaging, microbiology, ancillary and laboratory) are listed below for reference.    Significant Diagnostic Studies: DG Chest 1 View Result Date: 01/26/2024 EXAM: 1 VIEW XRAY OF THE CHEST 01/26/2024 09:11:00 PM COMPARISON: 11/11/2023 CLINICAL HISTORY: Chest burning. The patient states chest burning, hypotension, diarrhea, nausea, and dizziness. History of CAD, HTN, and former smoker. FINDINGS: LUNGS AND PLEURA: No focal pulmonary opacity. No pulmonary edema. No pleural effusion. No  pneumothorax. HEART AND MEDIASTINUM: No acute abnormality of the cardiac and mediastinal silhouettes. BONES AND SOFT TISSUES: No acute osseous abnormality. IMPRESSION: 1. No acute process. Electronically signed by: Norman Gatlin MD 01/26/2024 09:21 PM EDT RP Workstation: [id number]   CT ABDOMEN PELVIS WO CONTRAST Result Date: 01/26/2024 CLINICAL DATA:  Diarrhea. EXAM: CT ABDOMEN AND PELVIS WITHOUT CONTRAST TECHNIQUE: Multidetector CT imaging of the abdomen and pelvis was performed following the standard protocol without IV contrast. RADIATION DOSE REDUCTION: This exam was performed according to the departmental dose-optimization program which includes automated exposure control, adjustment of the mA and/or kV according to patient size and/or use of iterative reconstruction technique. COMPARISON:  CT abdomen pelvis dated 10/18/2023. FINDINGS: Evaluation of this exam is limited in the absence of intravenous contrast. Lower chest: The visualized lung bases are clear. No intra-abdominal free air or free fluid. Hepatobiliary: The liver is unremarkable. No biliary dilatation. The gallbladder is unremarkable. Pancreas: Unremarkable. No pancreatic ductal dilatation or surrounding inflammatory changes. Spleen: Normal in size without focal abnormality. Adrenals/Urinary Tract: The adrenal glands unremarkable. There is no hydronephrosis or nephrolithiasis on either side. Subcentimeter exophytic right renal inferior pole lesion is not characterized on this CT, likely a cyst. The visualized ureters and urinary bladder appear unremarkable. 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 There is a small parastomal hernia containing a loop of small bowel. There is no bowel obstruction or active inflammation. Appendectomy. Vascular/Lymphatic: The abdominal aorta and IVC unremarkable no portal venous gas. There is no adenopathy. Reproductive: The prostate and seminal vesicles are  grossly unremarkable. Other: None Musculoskeletal: No acute or significant osseous findings. IMPRESSION: 1. No acute intra-abdominal or pelvic pathology. 2. 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 No bowel obstruction. Electronically Signed   By: Vanetta Chou M.D.   On: 01/26/2024 21:20    Microbiology: No results found for this or any previous visit (from the past 240 hours).   Labs: CBC: Recent Labs  Lab 01/26/24 1754 01/27/24 0512 01/28/24 0524  WBC 9.4 8.0 7.6  HGB 13.5 12.5* 12.3*  HCT 40.3 37.4*  36.6*  MCV 83.8 82.7 82.6  PLT 270 207 217   Basic Metabolic Panel: Recent Labs  Lab 01/26/24 1754 01/27/24 0511 01/27/24 0512 01/28/24 0524  NA 138  --  139 144  K 4.3  --  4.0 4.1  CL 110  --  114* 117*  CO2 19*  --  19* 23  GLUCOSE 145*  --  108* 97  BUN 38*  --  34* 23*  CREATININE 2.25*  --  1.89* 1.35*  CALCIUM  9.1  --  8.7* 8.9  MG  --  2.1  --  2.1  PHOS  --  3.1  --  2.7   Liver Function Tests: Recent Labs  Lab 01/26/24 2033  AST 27  ALT 41  ALKPHOS 110  BILITOT 1.0  PROT 7.2  ALBUMIN 3.8   Recent Labs  Lab 01/26/24 1754  LIPASE 70*   No results for input(s): AMMONIA in the last 168 hours. Cardiac Enzymes: No results for input(s): CKTOTAL, CKMB, CKMBINDEX, TROPONINI in the last 168 hours. BNP (last 3 results) Recent Labs    02/23/23 1803 06/24/23 1200 11/11/23 0255  BNP 64.4 24.7 74.7   CBG: No results for input(s): GLUCAP in the last 168 hours.  Time spent: 35 minutes  Signed:  Elvan Sor  [facility] 01/28/2024 1:08 PM

## 2024-01-28 NOTE — Plan of Care (Signed)

## 2024-01-28 NOTE — Care Management Obs Status (Signed)
 MEDICARE OBSERVATION STATUS NOTIFICATION   Patient Details  Name: Clarence [name] MRN: [id number] Date of Birth: 1970/06/18   Medicare Observation Status Notification Given:  Yes    Rojelio SHAUNNA Rattler 01/28/2024, 9:40 AM

## 2024-01-28 NOTE — Plan of Care (Signed)
 Problem: Education: Goal: Knowledge of General Education information will improve Description: Including pain rating scale, medication(s)/side effects and non-pharmacologic comfort measures 01/28/2024 1410 by Arloa Dene KIDD, RN Outcome: Adequate for Discharge 01/28/2024 1410 by Arloa Dene KIDD, RN Outcome: Adequate for Discharge 01/28/2024 1159 by Arloa Dene KIDD, RN Outcome: Progressing   Problem: Health Behavior/Discharge Planning: Goal: Ability to manage health-related needs will improve 01/28/2024 1410 by Arloa Dene KIDD, RN Outcome: Adequate for Discharge 01/28/2024 1410 by Arloa Dene KIDD, RN Outcome: Adequate for Discharge 01/28/2024 1159 by Arloa Dene KIDD, RN Outcome: Progressing   Problem: Clinical Measurements: Goal: Ability to maintain clinical measurements within normal limits will improve 01/28/2024 1410 by Arloa Dene KIDD, RN Outcome: Adequate for Discharge 01/28/2024 1410 by Arloa Dene KIDD, RN Outcome: Adequate for Discharge 01/28/2024 1159 by Arloa Dene KIDD, RN Outcome: Progressing Goal: Will remain free from infection 01/28/2024 1410 by Arloa Dene KIDD, RN Outcome: Adequate for Discharge 01/28/2024 1410 by Arloa Dene KIDD, RN Outcome: Adequate for Discharge 01/28/2024 1159 by Arloa Dene KIDD, RN Outcome: Progressing Goal: Diagnostic test results will improve 01/28/2024 1410 by Arloa Dene KIDD, RN Outcome: Adequate for Discharge 01/28/2024 1410 by Arloa Dene KIDD, RN Outcome: Adequate for Discharge 01/28/2024 1159 by Arloa Dene KIDD, RN Outcome: Progressing Goal: Respiratory complications will improve 01/28/2024 1410 by Arloa Dene KIDD, RN Outcome: Adequate for Discharge 01/28/2024 1410 by Arloa Dene KIDD, RN Outcome: Adequate for Discharge 01/28/2024 1159 by Arloa Dene KIDD, RN Outcome: Progressing Goal: Cardiovascular complication will be avoided 01/28/2024 1410 by Arloa Dene KIDD, RN Outcome: Adequate for  Discharge 01/28/2024 1410 by Arloa Dene KIDD, RN Outcome: Adequate for Discharge 01/28/2024 1159 by Arloa Dene KIDD, RN Outcome: Progressing   Problem: Activity: Goal: Risk for activity intolerance will decrease 01/28/2024 1410 by Arloa Dene KIDD, RN Outcome: Adequate for Discharge 01/28/2024 1410 by Arloa Dene KIDD, RN Outcome: Adequate for Discharge 01/28/2024 1159 by Arloa Dene KIDD, RN Outcome: Progressing   Problem: Nutrition: Goal: Adequate nutrition will be maintained 01/28/2024 1410 by Arloa Dene KIDD, RN Outcome: Adequate for Discharge 01/28/2024 1410 by Arloa Dene KIDD, RN Outcome: Adequate for Discharge 01/28/2024 1159 by Arloa Dene KIDD, RN Outcome: Progressing   Problem: Coping: Goal: Level of anxiety will decrease 01/28/2024 1410 by Arloa Dene KIDD, RN Outcome: Adequate for Discharge 01/28/2024 1410 by Arloa Dene KIDD, RN Outcome: Adequate for Discharge 01/28/2024 1159 by Arloa Dene KIDD, RN Outcome: Progressing   Problem: Elimination: Goal: Will not experience complications related to bowel motility 01/28/2024 1410 by Arloa Dene KIDD, RN Outcome: Adequate for Discharge 01/28/2024 1410 by Arloa Dene KIDD, RN Outcome: Adequate for Discharge 01/28/2024 1159 by Arloa Dene KIDD, RN Outcome: Progressing Goal: Will not experience complications related to urinary retention 01/28/2024 1410 by Arloa Dene KIDD, RN Outcome: Adequate for Discharge 01/28/2024 1410 by Arloa Dene KIDD, RN Outcome: Adequate for Discharge 01/28/2024 1159 by Arloa Dene KIDD, RN Outcome: Progressing   Problem: Pain Managment: Goal: General experience of comfort will improve and/or be controlled 01/28/2024 1410 by Arloa Dene KIDD, RN Outcome: Adequate for Discharge 01/28/2024 1410 by Arloa Dene KIDD, RN Outcome: Adequate for Discharge 01/28/2024 1159 by Arloa Dene KIDD, RN Outcome: Progressing   Problem: Safety: Goal: Ability to remain free  from injury will improve 01/28/2024 1410 by Arloa Dene KIDD, RN Outcome: Adequate for Discharge 01/28/2024 1410 by Arloa Dene KIDD, RN Outcome: Adequate for Discharge 01/28/2024 1159 by Arloa Dene KIDD, RN Outcome: Progressing   Problem: Skin Integrity: Goal: Risk for impaired  skin integrity will decrease 01/28/2024 1410 by Arloa Dene KIDD, RN Outcome: Adequate for Discharge 01/28/2024 1410 by Arloa Dene KIDD, RN Outcome: Adequate for Discharge 01/28/2024 1159 by Arloa Dene KIDD, RN Outcome: Progressing

## 2024-01-28 NOTE — Plan of Care (Signed)
  Problem: Education: Goal: Knowledge of General Education information will improve Description: Including pain rating scale, medication(s)/side effects and non-pharmacologic comfort measures Outcome: Progressing   Problem: Clinical Measurements: Goal: Respiratory complications will improve Outcome: Progressing   Problem: Clinical Measurements: Goal: Cardiovascular complication will be avoided Outcome: Progressing   Problem: Activity: Goal: Risk for activity intolerance will decrease Outcome: Progressing   Problem: Nutrition: Goal: Adequate nutrition will be maintained Outcome: Progressing   Problem: Pain Managment: Goal: General experience of comfort will improve and/or be controlled Outcome: Progressing

## 2024-01-31 DIAGNOSIS — Z932 Ileostomy status: Secondary | ICD-10-CM | POA: Insufficient documentation

## 2024-02-03 ENCOUNTER — Telehealth: Payer: Self-pay | Admitting: Family

## 2024-02-03 NOTE — Telephone Encounter (Signed)
 Called to confirm/remind patient of their appointment at the [facility] on 02/03/24.   Appointment:   [] Confirmed  [x] Left mess   [] No answer/No voice mail  [] VM Full/unable to leave message  [] Phone not in service  Patient reminded to bring all medications and/or complete list.  Confirmed patient has transportation. Gave directions, instructed to utilize valet parking.

## 2024-02-03 NOTE — Progress Notes (Unsigned)
 ADVANCED HEART FAILURE FOLLOW UP CLINIC NOTE  Referring Physician: Leonarda Roxan BROCKS, NP  Primary Care: Ngetich, Roxan BROCKS, NP HF cardiology: Rolan Barrack, MD  Chief Complaint: shortness of breath   HPI:  Clarence [name] is a 53 y.o. male with a PMH of chronic systolic HF, Crohn's disease, CKD, HTN who presents for follow up of chronic heart failure with mildly reduced EF.   Patient was admitted in 3/23 with newly found cardiomyopathy, CHF and atypical PNA.  Echo in 3/23 showed EF 30-35%, moderate LVH, normal RV. LHC showed occluded RCA with collaterals, 60% mLAD, 65% mLCx, 90% mid OM1.  RHC showed elevated filling pressures and low CI (2).  Patient was admitted again in 4/23 with CHF. He was admitted in 5/23 with hypertensive urgency and chest pain, CTA abdomen/pelvis did not show renal artery stenosis. He was admitted again in 6/23 with hypertensive urgency.  He quit smoking after 3/23 admission.  Rarely drinks ETOH and no drugs.  No strong family history of CAD or cardiomyopathy.     Sleep study was done, not suggestive of OSA.    Patient continued to have chest pain episodes so LHC/RHC was done in 5/24 showing occluded mRCA with collaterals, 90% stenosis with severe diffuse disease in OM1. 50% mLAD, 60% proximal and mid LCx (unchanged from prior, no interventional target); normal filling pressures and preserved cardiac output.    Patient had progressive angina with repeat cath in 10/24.  This showed similar severe diffuse disease in the OM1, he had DES to OM1.  Other disease unchanged, CTO of RCA, 50% mid LAD.   Echo 07/22/23: EF 45-50%, G1DD, normal RV, normal PA pressure, mild MR  Admitted 10/18/23 with n/v & increasing ostomy output. GI PCR negative, C. difficile negative. IVF given.    Seen in [facility] 06/25 where entresto  was stopped due to ongoing diarrhea & recent admission due to hypovolemic.   Admitted 11/11/23 with 24 hours of chest pain along with uncontrolled HTN. Troponins  trended and were flat. Entresto  24/26mg  BID was reintroduced.   Admitted 01/26/24 with acute onset of hypotension with associated exhaustion and fatigue. He checked his blood pressure at home and it was 77/58 and 66/50. UA showed 6-10 WBCs with 30 protein and trace leukocytes and rare bacteria. Had AKI due to dehydration from n/v. Given 1L IVF. Metabolic acidosis so IV bicarb given. Lasix , entresto , jardiance  held due to AKI/ hypotension. Once BP improved and AKI resolved, meds were resumed.    SUBJECTIVE:  He presents today for a HF follow-up visit with a chief complaint of shortness of breath. Has associated mild bilateral pedal edema. Has been taking his entresto  BID but tends to only take his carvedilol  daily because he says that he forgets the second dose.    PMH, current medications, allergies, social history, and family history reviewed in epic.  ROS: All systems negative except what is listed in HPI, PMH and Problem List   PHYSICAL EXAM:  General: Well appearing.  Cor: No JVD. Regular rhythm, rate.  Lungs: clear Abdomen: soft, nontender, nondistended. Ostomy intact Extremities: no edema Neuro:. Affect pleasant   DATA REVIEW  ECHO: - Echo (3/23): EF 30-35%, moderate LVH, normal RV. - RHC (3/23): mean RA 16, PA 67/30 mean 42, mean PCWP 25, CI 2, PVR 4 - Echo (5/23): EF 35-40%, global HK, moderate LVH, RV normal, mild MR - Echo (4/24): EF 45-50%, inferior and inferolateral hypokinesis, normal RV.   - Echo 07/22/23: EF 45-50%, G1DD,  normal RV, normal PA pressure, mild MR  CATH: LHC (10/24): Occluded mRCA with collaterals, 90% long/diffuse stenosis in OM1, 50% mLAD. He was treated with DES to OM1.     ASSESSMENT & PLAN:  1: Chronic heart failure with mid range EF: Well compensated from a  HF standpoint, unfortunately majority of symptoms relate to known Crohn's. Prior colectomy plus ostomy.  - NYHA class II - mildly fluid up with pedal edema and weight gain - weight up 4  pounds from last visit here 6 weeks ago - Continue entresto  24/26mg  BID. Developed bloating and liquid stool when dose increased to 49/51mg  - Continue jardiance  10mg  daily.  - Continue coreg  3.125mg  BID. Emphasized that this must be taken BID. Take it w/ entresto , otherwise, may need to switch to metoprolol  succinate - Take furosemide  20mg  daily for next 2 days then 20mg  prn - developed hyperkalemia with previous spironolactone  use - BNP 11/11/23 was 74.7  2: CAD: No anginal symptoms today, has been a chronic complaint. - Continue imdur  60mg  daily, ASA 81mg  daily  - Continue clopidogrel  - continue atorvastatin  80mg  daily (resumed at last visit) - LDL 05/26/23 was 15  3: CKD Stage IIIB:  - BMET 01/28/24 reviewed: sodium 144, potassium 4.1, creatinine 1.35 & GFR >60 - BMET today - Continue jardiance  10mg  daily  4: Crohn's disease:  - Prior colectomy plus ostomy.  - saw GI 06/25   Return in 1 month, sooner if needed.   I spent 22 minutes reviewing records, interviewing/ examing patient and managing plan/ orders.   [name], [name]-C 02/04/24

## 2024-02-04 ENCOUNTER — Ambulatory Visit: Admitting: Family

## 2024-02-04 ENCOUNTER — Other Ambulatory Visit
Admission: RE | Admit: 2024-02-04 | Discharge: 2024-02-04 | Disposition: A | Discharge status: Home / Self Care | Attending: Family | Admitting: Family

## 2024-02-04 ENCOUNTER — Ambulatory Visit: Payer: Self-pay | Admitting: Family

## 2024-02-04 ENCOUNTER — Encounter: Payer: Self-pay | Admitting: Family

## 2024-02-04 VITALS — BP 118/88 | HR 82 | Wt 243.6 lb

## 2024-02-04 DIAGNOSIS — Z79899 Other long term (current) drug therapy: Secondary | ICD-10-CM | POA: Diagnosis not present

## 2024-02-04 DIAGNOSIS — I130 Hypertensive heart and chronic kidney disease with heart failure and stage 1 through stage 4 chronic kidney disease, or unspecified chronic kidney disease: Secondary | ICD-10-CM | POA: Insufficient documentation

## 2024-02-04 DIAGNOSIS — N183 Chronic kidney disease, stage 3 unspecified: Secondary | ICD-10-CM

## 2024-02-04 DIAGNOSIS — N1832 Chronic kidney disease, stage 3b: Secondary | ICD-10-CM | POA: Diagnosis not present

## 2024-02-04 DIAGNOSIS — I5022 Chronic systolic (congestive) heart failure: Secondary | ICD-10-CM | POA: Diagnosis not present

## 2024-02-04 DIAGNOSIS — R600 Localized edema: Secondary | ICD-10-CM | POA: Insufficient documentation

## 2024-02-04 DIAGNOSIS — I25118 Atherosclerotic heart disease of native coronary artery with other forms of angina pectoris: Secondary | ICD-10-CM | POA: Diagnosis not present

## 2024-02-04 DIAGNOSIS — K50918 Crohn's disease, unspecified, with other complication: Secondary | ICD-10-CM

## 2024-02-04 DIAGNOSIS — K5090 Crohn's disease, unspecified, without complications: Secondary | ICD-10-CM | POA: Diagnosis not present

## 2024-02-04 DIAGNOSIS — I2510 Atherosclerotic heart disease of native coronary artery without angina pectoris: Secondary | ICD-10-CM | POA: Diagnosis not present

## 2024-02-04 DIAGNOSIS — R0602 Shortness of breath: Secondary | ICD-10-CM | POA: Diagnosis not present

## 2024-02-04 DIAGNOSIS — Z9049 Acquired absence of other specified parts of digestive tract: Secondary | ICD-10-CM | POA: Insufficient documentation

## 2024-02-04 LAB — BASIC METABOLIC PANEL WITH GFR
Anion gap: 11 (ref 5–15)
BUN: 17 mg/dL (ref 6–20)
CO2: 23 mmol/L (ref 22–32)
Calcium: 9.4 mg/dL (ref 8.9–10.3)
Chloride: 108 mmol/L (ref 98–111)
Creatinine, Ser: 1.52 mg/dL — ABNORMAL HIGH (ref 0.61–1.24)
GFR, Estimated: 54 mL/min — ABNORMAL LOW (ref >60)
Glucose, Bld: 144 mg/dL — ABNORMAL HIGH (ref 70–99)
Potassium: 3.8 mmol/L (ref 3.5–5.1)
Sodium: 142 mmol/L (ref 135–145)

## 2024-02-04 MED ORDER — SACUBITRIL-VALSARTAN 49-51 MG PO TABS: 1.0000 | ORAL_TABLET | Freq: Two times a day (BID) | ORAL | Status: DC

## 2024-02-04 MED ORDER — ISOSORBIDE MONONITRATE ER 60 MG PO TB24: 60.0000 mg | ORAL_TABLET | Freq: Every day | ORAL | 3 refills | Status: DC

## 2024-02-04 NOTE — Patient Instructions (Signed)
 Medication Changes:  Take Coreg  twice daily with Entresto .  Take Lasix  once daily for 2 days   Lab Work:  Go downstairs to [place] on [address] to have your blood work completed.  We will only call you if the results are abnormal or if the provider would like to make medication changes.  No news is good news.   Special Instructions // Education:  It was good to see you today!  If you receive a satisfaction survey regarding the Heart Failure Clinic, please take the time to fill it out. This way we can continue to provide excellent care and make any changes that need to be made.   Follow-Up in: 1 MONTH WITH DR. ROLAN.   Thank you for choosing [facility].    At the [facility], you and your health needs are our priority. We have a designated team specialized in the treatment of Heart Failure. This Care Team includes your primary Heart Failure Specialized Cardiologist (physician), Advanced Practice Providers (APPs- Physician Assistants and Nurse Practitioners), and Pharmacist who all work together to provide you with the care you need, when you need it.   You may see any of the following providers on your designated Care Team at your next follow up:  Dr. Toribio Fuel Dr. Ezra ROLAN Dr. Ria Commander Dr. Morene Brownie [name], [name] Jaun Bash, RPH-CPP  Please be sure to bring in all your medications bottles to every appointment.   Need to Contact Us :  If you have any questions or concerns before your next appointment please send us  a message through [place] or call our office at [phone].    TO LEAVE A MESSAGE FOR THE NURSE SELECT OPTION 2, PLEASE LEAVE A MESSAGE INCLUDING: YOUR NAME DATE OF BIRTH CALL BACK NUMBER REASON FOR CALL**this is important as we prioritize the call backs  YOU WILL RECEIVE A CALL BACK THE SAME DAY AS LONG AS YOU CALL BEFORE 4:00 PM

## 2024-02-09 ENCOUNTER — Other Ambulatory Visit: Payer: Self-pay | Admitting: Student

## 2024-02-09 MED ORDER — VITAMIN B-12 1000 MCG PO TABS: 1000.0000 ug | ORAL_TABLET | Freq: Every day | ORAL | 1 refills | Status: AC

## 2024-03-02 ENCOUNTER — Telehealth: Payer: Self-pay | Admitting: Pediatrics

## 2024-03-02 NOTE — Telephone Encounter (Signed)
 Good Afternoon Dr.McGreal,  Patient calling wanting to cancel and reschedule double procedure he has for tomorrow 03/03/24 due to feeling dihydrated and muscles hurting, possibly going to the emergency room. Patient rescheduled for 04/07/24. Please advise  Thank you

## 2024-03-03 ENCOUNTER — Encounter: Admitting: Cardiology

## 2024-03-03 ENCOUNTER — Encounter: Admitting: Pediatrics

## 2024-03-07 ENCOUNTER — Other Ambulatory Visit: Payer: Self-pay

## 2024-03-14 ENCOUNTER — Telehealth: Payer: Self-pay | Admitting: Cardiology

## 2024-03-14 ENCOUNTER — Other Ambulatory Visit (HOSPITAL_COMMUNITY): Payer: Self-pay | Admitting: Cardiology

## 2024-03-14 NOTE — Telephone Encounter (Signed)
 Called to r/s appt on 11/14

## 2024-03-23 ENCOUNTER — Other Ambulatory Visit: Payer: Self-pay

## 2024-03-23 ENCOUNTER — Other Ambulatory Visit: Payer: Self-pay | Admitting: Family

## 2024-03-24 ENCOUNTER — Other Ambulatory Visit: Payer: Self-pay

## 2024-03-30 ENCOUNTER — Other Ambulatory Visit: Payer: Self-pay

## 2024-03-31 ENCOUNTER — Encounter: Admitting: Cardiology

## 2024-04-03 ENCOUNTER — Telehealth: Payer: Self-pay | Admitting: Family

## 2024-04-03 NOTE — Telephone Encounter (Signed)
 Called to confirm/remind patient of their appointment at the [facility] on 04/04/24.   Appointment:   [x] Confirmed  [] Left mess   [] No answer/No voice mail  [] VM Full/unable to leave message  [] Phone not in service  Patient reminded to bring all medications and/or complete list.  Confirmed patient has transportation. Gave directions, instructed to utilize valet parking.

## 2024-04-03 NOTE — Progress Notes (Unsigned)
 ADVANCED HEART FAILURE FOLLOW UP CLINIC NOTE  Referring Physician: Leonarda Roxan BROCKS, NP  Primary Care: Ngetich, Roxan BROCKS, NP HF cardiology: Rolan Barrack, MD  Chief Complaint:   HPI:  Clarence [name] is a 53 y.o. male with a PMH of chronic systolic HF, Crohn's disease, CKD, HTN who presents for follow up of chronic heart failure with mildly reduced EF.   Patient was admitted in 3/23 with newly found cardiomyopathy, CHF and atypical PNA.  Echo in 3/23 showed EF 30-35%, moderate LVH, normal RV. LHC showed occluded RCA with collaterals, 60% mLAD, 65% mLCx, 90% mid OM1.  RHC showed elevated filling pressures and low CI (2).  Patient was admitted again in 4/23 with CHF. He was admitted in 5/23 with hypertensive urgency and chest pain, CTA abdomen/pelvis did not show renal artery stenosis. He was admitted again in 6/23 with hypertensive urgency.  He quit smoking after 3/23 admission.  Rarely drinks ETOH and no drugs.  No strong family history of CAD or cardiomyopathy.     Sleep study was done, not suggestive of OSA.    Patient continued to have chest pain episodes so LHC/RHC was done in 5/24 showing occluded mRCA with collaterals, 90% stenosis with severe diffuse disease in OM1. 50% mLAD, 60% proximal and mid LCx (unchanged from prior, no interventional target); normal filling pressures and preserved cardiac output.    Patient had progressive angina with repeat cath in 10/24.  This showed similar severe diffuse disease in the OM1, he had DES to OM1.  Other disease unchanged, CTO of RCA, 50% mid LAD.   Echo 07/22/23: EF 45-50%, G1DD, normal RV, normal PA pressure, mild MR  Admitted 10/18/23 with n/v & increasing ostomy output. GI PCR negative, C. difficile negative. IVF given.    Seen in [facility] 06/25 where entresto  was stopped due to ongoing diarrhea & recent admission due to hypovolemic.   Admitted 11/11/23 with 24 hours of chest pain along with uncontrolled HTN. Troponins trended and were flat.  Entresto  24/26mg  BID was reintroduced.   Admitted 01/26/24 with acute onset of hypotension with associated exhaustion and fatigue. He checked his blood pressure at home and it was 77/58 and 66/50. UA showed 6-10 WBCs with 30 protein and trace leukocytes and rare bacteria. Had AKI due to dehydration from n/v. Given 1L IVF. Metabolic acidosis so IV bicarb given. Lasix , entresto , jardiance  held due to AKI/ hypotension. Once BP improved and AKI resolved, meds were resumed.    SUBJECTIVE:  He presents today for a HF follow-up visit with a chief complaint of shortness of breath. Has associated dizziness, right sided HA (pressure). Took 3 tylenol  with improvement of HA. Occasional tingling in right foot. Is scheduled for a colonoscopy later this week and was having some dizziness, HA with bowel prep. He reached out to provider who will be altering the bowel prep. Currently holding plavix  for colonoscopy.   PMH, current medications, allergies, social history, and family history reviewed in epic.  ROS: All systems negative except what is listed in HPI, PMH and Problem List   PHYSICAL EXAM:  General: Well appearing.  Cor: No JVD. Regular rhythm, rate.  Lungs: clear Abdomen: soft, nontender, nondistended. Ostomy intact.  Extremities: no edema Neuro:. Affect pleasant   DATA REVIEW  ECHO: - Echo (3/23): EF 30-35%, moderate LVH, normal RV. - RHC (3/23): mean RA 16, PA 67/30 mean 42, mean PCWP 25, CI 2, PVR 4 - Echo (5/23): EF 35-40%, global HK, moderate LVH, RV normal,  mild MR - Echo (4/24): EF 45-50%, inferior and inferolateral hypokinesis, normal RV.   - Echo 07/22/23: EF 45-50%, G1DD, normal RV, normal PA pressure, mild MR  CATH: LHC (10/24): Occluded mRCA with collaterals, 90% long/diffuse stenosis in OM1, 50% mLAD. He was treated with DES to OM1.     ASSESSMENT & PLAN:  1: Chronic heart failure with mid range EF: Well compensated from a  HF standpoint, unfortunately majority of symptoms  relate to known Crohn's. Prior colectomy plus ostomy.  - NYHA class II - euvolemic today - weight down 3 pounds from last visit here 2 months ago - Continue coreg  3.125mg  BID.  - Continue jardiance  10mg  daily.  - Take furosemide  20mg  daily prn. - Continue entresto  24/26mg  BID. Developed bloating and liquid stool when dose increased to 49/51mg  - developed hyperkalemia with previous spironolactone  use - BNP 11/11/23 was 74.7  2: CAD: No anginal symptoms today, has been a chronic complaint. - Continue imdur  60mg  daily  - Continue clopidogrel  (currently on hold for upcoming colonoscopy) - continue atorvastatin  80mg  daily  - LDL 05/26/23 was 15  3: CKD Stage IIIB:  - BMET 02/04/24 reviewed: sodium 142, potassium 3.8, creatinine 1.52 & GFR 54 - BMET today - Continue jardiance  10mg  daily  4: Crohn's disease:  - Prior colectomy plus ostomy.  - saw GI 06/25 - has upcoming colonoscopy later this week   Return in 3 months, sooner if needed.   I spent 30 minutes reviewing records, interviewing/ examing patient and managing plan/ orders.   [name], [name]-C 04/03/24

## 2024-04-04 ENCOUNTER — Ambulatory Visit: Payer: Self-pay | Admitting: Family

## 2024-04-04 ENCOUNTER — Other Ambulatory Visit
Admission: RE | Admit: 2024-04-04 | Discharge: 2024-04-04 | Disposition: A | Discharge status: Home / Self Care | Source: Ambulatory Visit | Attending: Family | Admitting: Family

## 2024-04-04 ENCOUNTER — Ambulatory Visit: Admitting: Family

## 2024-04-04 ENCOUNTER — Encounter: Payer: Self-pay | Admitting: Family

## 2024-04-04 VITALS — BP 126/93 | HR 91 | Wt 240.8 lb

## 2024-04-04 DIAGNOSIS — K50918 Crohn's disease, unspecified, with other complication: Secondary | ICD-10-CM

## 2024-04-04 DIAGNOSIS — I5022 Chronic systolic (congestive) heart failure: Secondary | ICD-10-CM | POA: Diagnosis present

## 2024-04-04 DIAGNOSIS — N183 Chronic kidney disease, stage 3 unspecified: Secondary | ICD-10-CM

## 2024-04-04 DIAGNOSIS — I25118 Atherosclerotic heart disease of native coronary artery with other forms of angina pectoris: Secondary | ICD-10-CM | POA: Diagnosis not present

## 2024-04-04 LAB — BASIC METABOLIC PANEL WITH GFR
Anion gap: 11 (ref 5–15)
BUN: 22 mg/dL — ABNORMAL HIGH (ref 6–20)
CO2: 24 mmol/L (ref 22–32)
Calcium: 10.1 mg/dL (ref 8.9–10.3)
Chloride: 103 mmol/L (ref 98–111)
Creatinine, Ser: 1.45 mg/dL — ABNORMAL HIGH (ref 0.61–1.24)
GFR, Estimated: 57 mL/min — ABNORMAL LOW (ref >60)
Glucose, Bld: 147 mg/dL — ABNORMAL HIGH (ref 70–99)
Potassium: 4.4 mmol/L (ref 3.5–5.1)
Sodium: 139 mmol/L (ref 135–145)

## 2024-04-04 NOTE — Patient Instructions (Signed)
 Medication Changes:  No medication changes today!  Lab Work:  Go downstairs to [place] on [address] to have your blood work completed.  We will only call you if the results are abnormal or if the provider would like to make medication changes.  No news is good news.   Follow-Up in: Please follow up with the [facility] Clinic in 3 months with [name], [name].   Thank you for choosing [facility].    At the [facility], you and your health needs are our priority. We have a designated team specialized in the treatment of Heart Failure. This Care Team includes your primary Heart Failure Specialized Cardiologist (physician), Advanced Practice Providers (APPs- Physician Assistants and Nurse Practitioners), and Pharmacist who all work together to provide you with the care you need, when you need it.   You may see any of the following providers on your designated Care Team at your next follow up:  Dr. Toribio Fuel Dr. Ezra Shuck Dr. Ria Commander Dr. Morene Brownie [name], [name] Jaun Bash, RPH-CPP  Please be sure to bring in all your medications bottles to every appointment.   Need to Contact Us :  If you have any questions or concerns before your next appointment please send us  a message through [place] or call our office at [phone].    TO LEAVE A MESSAGE FOR THE NURSE SELECT OPTION 2, PLEASE LEAVE A MESSAGE INCLUDING: YOUR NAME DATE OF BIRTH CALL BACK NUMBER REASON FOR CALL**this is important as we prioritize the call backs  YOU WILL RECEIVE A CALL BACK THE SAME DAY AS LONG AS YOU CALL BEFORE 4:00 PM

## 2024-04-05 NOTE — Progress Notes (Deleted)
 [facility] History and Physical   Primary Care Physician:  Ngetich, Roxan BROCKS, NP   Reason for Procedure:  Crohn's colitis status post colectomy with end ileostomy 2008, nausea, high output ileostomy  Plan:    Upper endoscopy, ileoscopy   The patient was provided an opportunity to ask questions and all were answered. The patient agreed with the plan.   HPI: Clarence [name] is a 53 y.o. male undergoing upper endoscopy and ileoscopy for history of colonic Crohn's disease status post colectomy with end ileostomy 2008, nausea and high output ileostomy.  Patient underwent colectomy for possible perforation in 2008 at [facility].  Anatomy unclear as to whether or not patient may have rectum intact.  Has not been on IBD medical therapy since the time of colectomy.  Endorses symptoms of high output ileostomy and nausea.  CT imaging 01/2024 showed postsurgical changes of colectomy with a right lower quadrant ileostomy but no other abnormalities.   Past Medical History:  Diagnosis Date   Ascending Aortic Dilation    a. 09/2021 Echo: Asc Ao 40mm.   CAD (coronary artery disease)    a. 07/2021 Cath: LM nl, LAD 22m, D2 40, LCX 65p/m, OM1 90, RCA 179m w/ L-.R collats to RPDA from septal 1/2-->Med rx.   Chronic HFrEF (heart failure with reduced ejection fraction) (HCC)    a. 07/2021 Echo: EF 30-35%, glob HK, mod LVH, GrII DD, nl RV fxn, mildly dil LA, mild-mod MR; b. 09/2021 Echo: EF 35-40%, glob HK, mod LVH, GrI DD, nl RV fxn, mildly dil RA, mild MR, Asc Ao 40mm.   CKD (chronic kidney disease) stage 2, GFR 60-89 ml/min    Crohn's disease (HCC)    Hypertension    Mixed Ischemic & Nonischemic cardiomyopathy    a. 07/2021 Echo: EF 30-35%; b. 07/2021 Cath: Occluded RCA w/ mod LAD/LCX dzs, and severe OM1 dzs-->Med Rx; c. 09/2021 Echo: EF 35-40%.   PAH (pulmonary artery hypertension) (HCC)    a. 07/2021 RHC: PA 67/30 (42).   Proteinuria     Past Surgical History:  Procedure Laterality Date   COLECTOMY      CORONARY STENT INTERVENTION N/A 03/02/2023   Procedure: CORONARY STENT INTERVENTION;  Surgeon: Mady Bruckner, MD;  Location: [facility] INVASIVE CV LAB;  Service: Cardiovascular;  Laterality: N/A;   LEFT HEART CATH AND CORONARY ANGIOGRAPHY Left 03/02/2023   Procedure: LEFT HEART CATH AND CORONARY ANGIOGRAPHY;  Surgeon: Mady Bruckner, MD;  Location: [facility] INVASIVE CV LAB;  Service: Cardiovascular;  Laterality: Left;   RIGHT/LEFT HEART CATH AND CORONARY ANGIOGRAPHY N/A 08/07/2021   Procedure: RIGHT/LEFT HEART CATH AND CORONARY ANGIOGRAPHY;  Surgeon: Mady Bruckner, MD;  Location: [facility] INVASIVE CV LAB;  Service: Cardiovascular;  Laterality: N/A;   RIGHT/LEFT HEART CATH AND CORONARY ANGIOGRAPHY N/A 09/29/2022   Procedure: RIGHT/LEFT HEART CATH AND CORONARY ANGIOGRAPHY;  Surgeon: Rolan Ezra RAMAN, MD;  Location: [facility] INVASIVE CV LAB;  Service: Cardiovascular;  Laterality: N/A;    Prior to Admission medications   Medication Sig Start Date End Date Taking? Authorizing Provider  atorvastatin  (LIPITOR ) 80 MG tablet Take 1 tablet (80 mg total) by mouth every evening. 10/20/23   Maree Hue, MD  carvedilol  (COREG ) 3.125 MG tablet Take 1 tablet (3.125 mg total) by mouth 2 (two) times daily. Patient taking differently: Take 3.125 mg by mouth daily. 10/20/23   Maree Hue, MD  clopidogrel  (PLAVIX ) 75 MG tablet Take 1 tablet (75 mg total) by mouth daily. Patient not taking: Reported on 04/04/2024 12/07/23 12/01/24  [place]ette [place] A, [name]  cyanocobalamin  (VITAMIN B12) 1000 MCG tablet Take 1 tablet (1,000 mcg total) by mouth daily. 02/09/24 08/07/24  Von Bellis, MD  famotidine  (PEPCID ) 40 MG tablet Take 1 tablet (40 mg total) by mouth every evening. 01/25/24 01/24/25  Mayo, Darice LABOR, [name]  furosemide  (LASIX ) 20 MG tablet Take 1 tablet (20 mg total) by mouth daily as needed. For weight gain of 3 lb in 24 hours or 5 lbs in one week 10/20/23   Maree Hue, MD  isosorbide  mononitrate (IMDUR ) 60 MG 24 hr tablet Take 1 tablet (60 mg  total) by mouth daily. Take 1 tab every morning and 1/2 tab every evening 02/04/24   [place] LABOR, [name]  JARDIANCE  10 MG TABS tablet TAKE ONE TABLET BY MOUTH DAILY 03/15/24   McLean, Dalton S, MD  ranolazine  (RANEXA ) 500 MG 12 hr tablet Take 500 mg by mouth 2 (two) times daily.    [provider]  sacubitril -valsartan  (ENTRESTO ) 49-51 MG Take 1 tablet by mouth 2 (two) times daily. 02/04/24   [place]ette [place] LABOR, [name]    Current Outpatient Medications  Medication Sig Dispense Refill   atorvastatin  (LIPITOR ) 80 MG tablet Take 1 tablet (80 mg total) by mouth every evening. 90 tablet 3   carvedilol  (COREG ) 3.125 MG tablet Take 1 tablet (3.125 mg total) by mouth 2 (two) times daily. (Patient taking differently: Take 3.125 mg by mouth daily.) 60 tablet 0   clopidogrel  (PLAVIX ) 75 MG tablet Take 1 tablet (75 mg total) by mouth daily. (Patient not taking: Reported on 04/04/2024) 30 tablet 11   cyanocobalamin  (VITAMIN B12) 1000 MCG tablet Take 1 tablet (1,000 mcg total) by mouth daily. 90 tablet 1   famotidine  (PEPCID ) 40 MG tablet Take 1 tablet (40 mg total) by mouth every evening. 30 tablet 1   furosemide  (LASIX ) 20 MG tablet Take 1 tablet (20 mg total) by mouth daily as needed. For weight gain of 3 lb in 24 hours or 5 lbs in one week 90 tablet 1   isosorbide  mononitrate (IMDUR ) 60 MG 24 hr tablet Take 1 tablet (60 mg total) by mouth daily. Take 1 tab every morning and 1/2 tab every evening 45 tablet 3   JARDIANCE  10 MG TABS tablet TAKE ONE TABLET BY MOUTH DAILY 90 tablet 2   ranolazine  (RANEXA ) 500 MG 12 hr tablet Take 500 mg by mouth 2 (two) times daily.     sacubitril -valsartan  (ENTRESTO ) 49-51 MG Take 1 tablet by mouth 2 (two) times daily.     No current facility-administered medications for this visit.    Allergies as of 04/07/2024 - Review Complete 04/04/2024  Allergen Reaction Noted   Ceftriaxone  Rash 06/12/2023   Ibuprofen Other (See Comments) 09/08/2012   Sulfacetamide Other (See  Comments) 09/08/2012   Neomycin-bacitracin zn-polymyx  09/08/2012   Spironolactone   02/17/2022    Family History  Problem Relation Age of Onset   Hypertension Mother    Cancer Maternal Aunt    Heart disease Maternal Uncle    Pancreatic cancer Maternal Uncle     Social History   Socioeconomic History   Marital status: Single    Spouse name: Not on file   Number of children: Not on file   Years of education: Not on file   Highest education level: Not on file  Occupational History   Occupation: funeral home  Tobacco Use   Smoking status: Former    Types: Cigars    Quit date: 08/13/2021  Years since quitting: 2.6   Smokeless tobacco: Not on file  Vaping Use   Vaping status: Never Used  Substance and Sexual Activity   Alcohol use: Not Currently    Alcohol/week: 1.0 standard drink of alcohol    Types: 1 Standard drinks or equivalent per week   Drug use: Never   Sexual activity: Not Currently  Other Topics Concern   Not on file  Social History Narrative   Patient is a past smoker for 10 years, drinks decaf coffee and tea.   Patient is single and lives at home with his mother and uncle and has no pets.   Patient does not exercise daily due to difficulty walking distances.   Patient has no issues with his functional status and is independent in caring for himself   Social Drivers of Health   Financial Resource Strain: High Risk (01/29/2022)   Overall Financial Resource Strain (CARDIA)    Difficulty of Paying Living Expenses: Hard  Food Insecurity: No Food Insecurity (01/28/2024)   Hunger Vital Sign    Worried About Running Out of Food in the Last Year: Never true    Ran Out of Food in the Last Year: Never true  Transportation Needs: No Transportation Needs (01/28/2024)   PRAPARE - Administrator, Civil Service (Medical): No    Lack of Transportation (Non-Medical): No  Physical Activity: Not on file  Stress: Not on file  Social Connections: Unknown  (09/29/2021)   Received from [facility]   Social Network    Social Network: Not on file  Intimate Partner Violence: Not At Risk (01/28/2024)   Humiliation, Afraid, Rape, and Kick questionnaire    Fear of Current or Ex-Partner: No    Emotionally Abused: No    Physically Abused: No    Sexually Abused: No    Review of Systems:  All other review of systems negative except as mentioned in the HPI.  Physical Exam: Vital signs There were no vitals taken for this visit.  General:   Alert,  Well-developed, well-nourished, pleasant and cooperative in NAD Airway:  Mallampati  Lungs:  Clear throughout to auscultation.   Heart:  Regular rate and rhythm; no murmurs, clicks, rubs,  or gallops. Abdomen:  Soft, nontender and nondistended. Normal bowel sounds.   Neuro/Psych:  Normal mood and affect. A and O x 3  Inocente Hausen, MD [facility]

## 2024-04-07 ENCOUNTER — Encounter: Admitting: Pediatrics

## 2024-04-07 ENCOUNTER — Telehealth: Payer: Self-pay | Admitting: Pediatrics

## 2024-04-07 DIAGNOSIS — K50918 Crohn's disease, unspecified, with other complication: Secondary | ICD-10-CM

## 2024-04-07 DIAGNOSIS — R197 Diarrhea, unspecified: Secondary | ICD-10-CM

## 2024-04-07 DIAGNOSIS — Z9889 Other specified postprocedural states: Secondary | ICD-10-CM

## 2024-04-07 DIAGNOSIS — R110 Nausea: Secondary | ICD-10-CM

## 2024-04-07 NOTE — Telephone Encounter (Signed)
 Inbound call from patient stating he has a double procedure today at 2:30pm and has been following the prep instructions but hasn't been able to have many bowel movements, would like to speak to nurse and be advised on what to do Requesting a call back  Please advise  Thank you

## 2024-04-07 NOTE — Telephone Encounter (Signed)
 Patient called saying he was not cleaned out and when asked about his prep he did not read the new instructions so he did not prep properly. He was rescheduled for 05/22/24 and new prep instructions were reviewed over the phone and sent via MyChart. Confirmed that the patient can log onto MyChart and he agreed.

## 2024-04-26 ENCOUNTER — Other Ambulatory Visit: Payer: Self-pay

## 2024-04-27 ENCOUNTER — Encounter: Payer: Self-pay | Admitting: Family

## 2024-04-27 ENCOUNTER — Ambulatory Visit (INDEPENDENT_AMBULATORY_CARE_PROVIDER_SITE_OTHER): Payer: Self-pay | Admitting: Family

## 2024-04-27 VITALS — BP 134/86 | HR 89 | Temp 97.9°F | Resp 19 | Ht 70.0 in | Wt 242.2 lb

## 2024-04-27 DIAGNOSIS — I5022 Chronic systolic (congestive) heart failure: Secondary | ICD-10-CM

## 2024-04-27 DIAGNOSIS — E782 Mixed hyperlipidemia: Secondary | ICD-10-CM

## 2024-04-27 DIAGNOSIS — G603 Idiopathic progressive neuropathy: Secondary | ICD-10-CM

## 2024-04-27 DIAGNOSIS — I25118 Atherosclerotic heart disease of native coronary artery with other forms of angina pectoris: Secondary | ICD-10-CM

## 2024-04-27 DIAGNOSIS — Z23 Encounter for immunization: Secondary | ICD-10-CM

## 2024-04-27 DIAGNOSIS — Z933 Colostomy status: Secondary | ICD-10-CM

## 2024-04-27 DIAGNOSIS — N1831 Chronic kidney disease, stage 3a: Secondary | ICD-10-CM

## 2024-04-27 DIAGNOSIS — I10 Essential (primary) hypertension: Secondary | ICD-10-CM

## 2024-04-27 DIAGNOSIS — E1100 Type 2 diabetes mellitus with hyperosmolarity without nonketotic hyperglycemic-hyperosmolar coma (NKHHC): Secondary | ICD-10-CM

## 2024-04-27 MED ORDER — RANOLAZINE ER 500 MG PO TB12: 500.0000 mg | ORAL_TABLET | Freq: Two times a day (BID) | ORAL | 1 refills | Status: AC

## 2024-04-27 NOTE — Patient Instructions (Signed)
 1.Report to local pharmacy to receive Tdap & Hepatitis B Vaccines. 2.Stop at check out & schedule Annual Wellness Visit.

## 2024-04-28 LAB — CBC WITH DIFFERENTIAL/PLATELET
Absolute Lymphocytes: 1702 {cells}/uL (ref 850–3900)
Absolute Monocytes: 723 {cells}/uL (ref 200–950)
Basophils Absolute: 32 {cells}/uL (ref 0–200)
Basophils Relative: 0.5 %
Eosinophils Absolute: 147 {cells}/uL (ref 15–500)
Eosinophils Relative: 2.3 %
HCT: 50.2 % (ref 39.4–51.1)
Hemoglobin: 16.4 g/dL (ref 13.2–17.1)
MCH: 26.8 pg — ABNORMAL LOW (ref 27.0–33.0)
MCHC: 32.7 g/dL (ref 31.6–35.4)
MCV: 82.2 fL (ref 81.4–101.7)
MPV: 10.5 fL (ref 7.5–12.5)
Monocytes Relative: 11.3 %
Neutro Abs: 3795 {cells}/uL (ref 1500–7800)
Neutrophils Relative %: 59.3 %
Platelets: 254 Thousand/uL (ref 140–400)
RBC: 6.11 Million/uL — ABNORMAL HIGH (ref 4.20–5.80)
RDW: 14.3 % (ref 11.0–15.0)
Total Lymphocyte: 26.6 %
WBC: 6.4 Thousand/uL (ref 3.8–10.8)

## 2024-04-28 LAB — HEPATIC FUNCTION PANEL
AG Ratio: 1.5 (calc) (ref 1.0–2.5)
ALT: 53 U/L — ABNORMAL HIGH (ref 9–46)
AST: 35 U/L (ref 10–35)
Albumin: 4.3 g/dL (ref 3.6–5.1)
Alkaline phosphatase (APISO): 115 U/L (ref 35–144)
Bilirubin, Direct: 0.1 mg/dL (ref 0.0–0.2)
Globulin: 2.8 g/dL (ref 1.9–3.7)
Indirect Bilirubin: 0.6 mg/dL (ref 0.2–1.2)
Total Bilirubin: 0.7 mg/dL (ref 0.2–1.2)
Total Protein: 7.1 g/dL (ref 6.1–8.1)

## 2024-04-28 LAB — HEMOGLOBIN A1C
Hgb A1c MFr Bld: 6.1 % — ABNORMAL HIGH (ref <5.7)
Mean Plasma Glucose: 128 mg/dL
eAG (mmol/L): 7.1 mmol/L

## 2024-04-28 LAB — LIPID PANEL
Cholesterol: 218 mg/dL — ABNORMAL HIGH (ref <200)
HDL: 40 mg/dL (ref >=40)
LDL Cholesterol (Calc): 122 mg/dL — ABNORMAL HIGH
Non-HDL Cholesterol (Calc): 178 mg/dL — ABNORMAL HIGH (ref <130)
Total CHOL/HDL Ratio: 5.5 (calc) — ABNORMAL HIGH (ref <5.0)
Triglycerides: 394 mg/dL — ABNORMAL HIGH (ref <150)

## 2024-04-28 LAB — TSH: TSH: 1.18 m[IU]/L (ref 0.40–4.50)

## 2024-04-30 NOTE — Progress Notes (Signed)
 Provider: Roxan Plough [name]-C   Arvella Massingale, Roxan BROCKS, NP  Patient Care Team: Richard Holz, Roxan BROCKS, NP as PCP - General (Family Medicine) End, Lonni, MD as PCP - Cardiology (Cardiology) Rolan Ezra RAMAN, MD as PCP - Advanced Heart Failure (Cardiology)  Extended Emergency Contact Information Primary Emergency Contact: [facility] Phone: [phone] Relation: Uncle  Code Status:  Full Code  Goals of care: Advanced Directive information    04/27/2024   12:48 PM  Advanced Directives  Does Patient Have a Medical Advance Directive? No  Would patient like information on creating a medical advance directive? No - Patient declined     Chief Complaint  Patient presents with   Medical Management of Chronic Issues    6 Month follow up.    HPI:  Pt is a 53 y.o. male seen today for 6 months follow up for medical   management of chronic diseases. Has medical history: Essential   hypertension, chronic congestive heart failure, chronic kidney disease   stage III, hyperlipidemia, peripheral neuropathy, type 2 diabetes mellitus,   presence of colostomy among others.   No home blood sugars for evaluation.  Denies any symptoms of hypo or Hyperglycemia.   He continues to have numbness on left foot.    Past Medical History:  Diagnosis Date   Ascending Aortic Dilation    a. 09/2021 Echo: Asc Ao 40mm.   CAD (coronary artery disease)    a. 07/2021 Cath: LM nl, LAD 63m, D2 40, LCX 65p/m, OM1 90, RCA 17m w/ L-.R collats to RPDA from septal 1/2-->Med rx.   Chronic HFrEF (heart failure with reduced ejection fraction) (HCC)    a. 07/2021 Echo: EF 30-35%, glob HK, mod LVH, GrII DD, nl RV fxn, mildly dil LA, mild-mod MR; b. 09/2021 Echo: EF 35-40%, glob HK, mod LVH, GrI DD, nl RV fxn, mildly dil RA, mild MR, Asc Ao 40mm.   CKD (chronic kidney disease) stage 2, GFR 60-89 ml/min    Crohn's disease (HCC)    Hypertension    Mixed Ischemic & Nonischemic cardiomyopathy    a. 07/2021 Echo:  EF 30-35%; b. 07/2021 Cath: Occluded RCA w/ mod LAD/LCX dzs, and severe OM1 dzs-->Med Rx; c. 09/2021 Echo: EF 35-40%.   PAH (pulmonary artery hypertension) (HCC)    a. 07/2021 RHC: PA 67/30 (42).   Proteinuria    Past Surgical History:  Procedure Laterality Date   COLECTOMY     CORONARY STENT INTERVENTION N/A 03/02/2023   Procedure: CORONARY STENT INTERVENTION;  Surgeon: Mady Lonni, MD;  Location: [facility] INVASIVE CV LAB;  Service: Cardiovascular;  Laterality: N/A;   LEFT HEART CATH AND CORONARY ANGIOGRAPHY Left 03/02/2023   Procedure: LEFT HEART CATH AND CORONARY ANGIOGRAPHY;  Surgeon: Mady Lonni, MD;  Location: [facility] INVASIVE CV LAB;  Service: Cardiovascular;  Laterality: Left;   RIGHT/LEFT HEART CATH AND CORONARY ANGIOGRAPHY N/A 08/07/2021   Procedure: RIGHT/LEFT HEART CATH AND CORONARY ANGIOGRAPHY;  Surgeon: Mady Lonni, MD;  Location: [facility] INVASIVE CV LAB;  Service: Cardiovascular;  Laterality: N/A;   RIGHT/LEFT HEART CATH AND CORONARY ANGIOGRAPHY N/A 09/29/2022   Procedure: RIGHT/LEFT HEART CATH AND CORONARY ANGIOGRAPHY;  Surgeon: Rolan Ezra RAMAN, MD;  Location: [facility] INVASIVE CV LAB;  Service: Cardiovascular;  Laterality: N/A;    Allergies[1]  Allergies as of 04/27/2024       Reactions   Ceftriaxone  Rash   Macular rash with itchness   Ibuprofen Other (See Comments)   History of GIB with ibuprofen    Sulfacetamide Other (See  Comments)   Pt can tolerate sulfacetamide 500mg     Neomycin-bacitracin Zn-polymyx    Other reaction(s): Other (See Comments)   Spironolactone     Hyperkalemia        Medication List        Accurate as of April 27, 2024 11:59 PM. If you have any questions, ask your nurse or doctor.          acetaminophen  650 MG CR tablet Commonly known as: TYLENOL  Take 650 mg by mouth as needed for pain.   atorvastatin  80 MG tablet Commonly known as: LIPITOR  Take 1 tablet (80 mg total) by mouth every evening.   BAYER ASPIRIN  PO Take 81 mg by  mouth as needed.   carvedilol  6.25 MG tablet Commonly known as: COREG  Take 1.5 mg by mouth 2 (two) times daily with a meal. What changed: Another medication with the same name was removed. Continue taking this medication, and follow the directions you see here. Changed by: Roxan Plough, NP   CENTRUM ADULT PO Take by mouth daily.   clopidogrel  75 MG tablet Commonly known as: PLAVIX  Take 1 tablet (75 mg total) by mouth daily.   cyanocobalamin  1000 MCG tablet Commonly known as: VITAMIN B12 Take 1 tablet (1,000 mcg total) by mouth daily.   Entresto  24-26 MG Generic drug: sacubitril -valsartan  Take 1 tablet by mouth 2 (two) times daily. What changed: Another medication with the same name was removed. Continue taking this medication, and follow the directions you see here. Changed by: Roxan Plough, NP   famotidine  40 MG tablet Commonly known as: PEPCID  Take 1 tablet (40 mg total) by mouth every evening.   furosemide  20 MG tablet Commonly known as: LASIX  Take 1 tablet (20 mg total) by mouth daily as needed. For weight gain of 3 lb in 24 hours or 5 lbs in one week   isosorbide  mononitrate 60 MG 24 hr tablet Commonly known as: IMDUR  Take 1 tablet (60 mg total) by mouth daily. Take 1 tab every morning and 1/2 tab every evening   Jardiance  10 MG Tabs tablet Generic drug: empagliflozin  TAKE ONE TABLET BY MOUTH DAILY   ranolazine  500 MG 12 hr tablet Commonly known as: RANEXA  Take 1 tablet (500 mg total) by mouth 2 (two) times daily.   spironolactone  25 MG tablet Commonly known as: ALDACTONE  Take 25 mg by mouth daily.        Review of Systems  Constitutional:  Negative for appetite change, chills, fatigue, fever and unexpected weight change.  HENT:  Negative for congestion, dental problem, ear discharge, ear pain, facial swelling, hearing loss, nosebleeds, postnasal drip, rhinorrhea, sinus pressure, sinus pain, sneezing, sore throat, tinnitus and trouble swallowing.   Eyes:   Negative for pain, discharge, redness, itching and visual disturbance.  Respiratory:  Negative for cough, chest tightness, shortness of breath and wheezing.   Cardiovascular:  Negative for chest pain, palpitations and leg swelling.  Gastrointestinal:  Negative for abdominal distention, abdominal pain, blood in stool, constipation, diarrhea, nausea and vomiting.       Colostomy   Endocrine: Negative for cold intolerance, heat intolerance, polydipsia, polyphagia and polyuria.  Genitourinary:  Negative for difficulty urinating, dysuria, flank pain, frequency and urgency.  Musculoskeletal:  Negative for arthralgias, back pain, gait problem, joint swelling, myalgias, neck pain and neck stiffness.  Skin:  Negative for color change, pallor, rash and wound.  Neurological:  Positive for numbness. Negative for dizziness, syncope, speech difficulty, weakness, light-headedness and headaches.  Worst on left foot   Hematological:  Does not bruise/bleed easily.  Psychiatric/Behavioral:  Negative for agitation, behavioral problems, confusion, hallucinations, self-injury, sleep disturbance and suicidal ideas. The patient is not nervous/anxious.     Immunization History  Administered Date(s) Administered   Influenza, Seasonal, Injecte, Preservative Fre 04/27/2024   Moderna SARS-COV2 Booster Vaccination 04/21/2020, 01/07/2021   PNEUMOCOCCAL CONJUGATE-20 04/27/2024   Tdap 05/18/2013   Zoster Recombinant(Shingrix ) 01/07/2021, 07/29/2022   Pertinent  Health Maintenance Due  Topic Date Due   Influenza Vaccine  Completed      07/15/2022    8:39 AM 07/28/2022    2:14 PM 09/18/2022    2:51 PM 01/05/2023    1:56 PM 04/27/2024   12:47 PM  Fall Risk  Falls in the past year? 0 0 0 0 0  Was there an injury with Fall? 0  0  0  0  0  Fall Risk Category Calculator 0 0 0 0 0  Patient at Risk for Falls Due to No Fall Risks  No Fall Risks No Fall Risks No Fall Risks  Fall risk Follow up Falls evaluation completed   Falls evaluation completed Falls evaluation completed Falls evaluation completed     Data saved with a previous flowsheet row definition   Functional Status Survey:    Vitals:   04/27/24 1258  BP: 134/86  Pulse: 89  Resp: 19  Temp: 97.9 F (36.6 C)  SpO2: 95%  Weight: 242 lb 3.2 oz (109.9 kg)  Height: 5' 10 (1.778 m)   Body mass index is 34.75 kg/m. Physical Exam Vitals reviewed.  Constitutional:      General: He is not in acute distress.    Appearance: Normal appearance. He is normal weight. He is not ill-appearing or diaphoretic.  HENT:     Head: Normocephalic.     Right Ear: Tympanic membrane, ear canal and external ear normal. There is no impacted cerumen.     Left Ear: Tympanic membrane, ear canal and external ear normal. There is no impacted cerumen.     Nose: Nose normal. No congestion or rhinorrhea.     Mouth/Throat:     Mouth: Mucous membranes are moist.     Pharynx: Oropharynx is clear. No oropharyngeal exudate or posterior oropharyngeal erythema.  Eyes:     General: No scleral icterus.       Right eye: No discharge.        Left eye: No discharge.     Extraocular Movements: Extraocular movements intact.     Conjunctiva/sclera: Conjunctivae normal.     Pupils: Pupils are equal, round, and reactive to light.  Neck:     Vascular: No carotid bruit.  Cardiovascular:     Rate and Rhythm: Normal rate and regular rhythm.     Pulses: Normal pulses.     Heart sounds: Normal heart sounds. No murmur heard.    No friction rub. No gallop.  Pulmonary:     Effort: Pulmonary effort is normal. No respiratory distress.     Breath sounds: Normal breath sounds. No wheezing, rhonchi or rales.  Chest:     Chest wall: No tenderness.  Abdominal:     General: Bowel sounds are normal. There is no distension.     Palpations: Abdomen is soft. There is no mass.     Tenderness: There is no abdominal tenderness. There is no right CVA tenderness, left CVA tenderness, guarding or  rebound.  Musculoskeletal:        General: No  swelling or tenderness. Normal range of motion.     Cervical back: Normal range of motion. No rigidity or tenderness.     Right lower leg: No edema.     Left lower leg: No edema.  Lymphadenopathy:     Cervical: No cervical adenopathy.  Skin:    General: Skin is warm and dry.     Coloration: Skin is not pale.     Findings: No bruising, erythema, lesion or rash.  Neurological:     Mental Status: He is alert and oriented to person, place, and time.     Cranial Nerves: No cranial nerve deficit.     Sensory: No sensory deficit.     Motor: No weakness.     Coordination: Coordination normal.     Gait: Gait normal.  Psychiatric:        Mood and Affect: Mood normal.        Speech: Speech normal.        Behavior: Behavior normal.        Thought Content: Thought content normal.        Judgment: Judgment normal.      Labs reviewed: Recent Labs    06/15/23 0615 06/17/23 0442 01/27/24 0511 01/27/24 0512 01/28/24 0524 02/04/24 1423 04/04/24 1452  NA 137   < >  --    < > 144 142 139  K 4.7   < >  --    < > 4.1 3.8 4.4  CL 112*   < >  --    < > 117* 108 103  CO2 19*   < >  --    < > 23 23 24   GLUCOSE 127*   < >  --    < > 97 144* 147*  BUN 54*   < >  --    < > 23* 17 22*  CREATININE 1.83*   < >  --    < > 1.35* 1.52* 1.45*  CALCIUM  8.6*   < >  --    < > 8.9 9.4 10.1  MG 2.6*  --  2.1  --  2.1  --   --   PHOS 2.1*  --  3.1  --  2.7  --   --    < > = values in this interval not displayed.   Recent Labs    12/30/23 1736 01/10/24 1552 01/26/24 2033 04/27/24 1353  AST 24 18 27  35  ALT 30 22 41 53*  ALKPHOS 80 97 110  --   BILITOT 1.6* 0.6 1.0 0.7  PROT 7.6 7.0 7.2 7.1  ALBUMIN 4.1 4.1 3.8  --    Recent Labs    11/11/23 0256 11/11/23 1024 01/10/24 1552 01/26/24 1754 01/27/24 0512 01/28/24 0524 04/27/24 1353  WBC 7.2   < > 8.4   < > 8.0 7.6 6.4  NEUTROABS 3.9  --  6.0  --   --   --  3,795  HGB 13.5   < > 14.8   < > 12.5*  12.3* 16.4  HCT 40.1   < > 44.9   < > 37.4* 36.6* 50.2  MCV 82.7   < > 83.4   < > 82.7 82.6 82.2  PLT 238   < > 276.0   < > 207 217 254   < > = values in this interval not displayed.   Lab Results  Component Value Date   TSH 1.18 04/27/2024   Lab Results  Component Value  Date   HGBA1C 6.1 (H) 04/27/2024   Lab Results  Component Value Date   CHOL 218 (H) 04/27/2024   HDL 40 04/27/2024   LDLCALC 122 (H) 04/27/2024   LDLDIRECT 69.0 09/09/2021   TRIG 394 (H) 04/27/2024   CHOLHDL 5.5 (H) 04/27/2024    Significant Diagnostic Results in last 30 days:  No results found.  Assessment/Plan 1. Essential hypertension (Primary) B/p well continue  - TSH - CBC with Differential/Platelet - Hepatic function panel  2. Chronic HFrEF (heart failure with reduced ejection fraction) (HCC) No signs of fluid overload  - Keep legs elevated when seated to keep swelling down  - check weight at least three times per week and notify provider for any abrupt weight gain > 3 lbs  - Reduce salt intake in diet  - CBC with Differential/Platelet - Hepatic function panel  3. CKD stage 3a, GFR 45-59 ml/min (HCC) CR 1.83  - Will continue to avoid Nephrotoxins and dose all other medication for renal clearance   4. Mixed hyperlipidemia - LDL 122 - continue on Atorvastatin   - atorvastatin  (LIPITOR ) 80 MG tablet; Take 1 tablet (80 mg total) by mouth every evening.  Dispense: 90 tablet; Refill: 3 - Lipid panel  5. Coronary artery disease of native artery of native heart with stable angina pectoris - continue on Imdur   Continue to control high risk factors   6. Idiopathic progressive neuropathy Continue to monitor   7. Colostomy present (HCC) Patent  - continue to monitor  8. Type 2 diabetes mellitus with hyperosmolarity without coma, without long-term current use of insulin  (HCC) Lab Results  Component Value Date   HGBA1C 6.1 (H) 04/27/2024  - controlled  - continue on Jardiance   - TSH - CBC  with Differential/Platelet - Hemoglobin A1c - Hepatic function panel  9. Immunization due Afebrile  - Pneumococcal conjugate vaccine 20-valent (Prevnar 20) - Flu vaccine trivalent PF, 6mos and older(Flulaval,Afluria,Fluarix,Fluzone)  Family/ staff Communication: Reviewed plan of care with patient verbalized understanding   Labs/tests ordered:  - CBC with Differential/Platelet - Hepatic panel  - TSH - Hgb A1C - Lipid panel  Next Appointment : Return in about 6 months (around 10/26/2024) for medical mangement of chronic issues.SABRA   Spent 30 minutes of Face to face and non-face to face with patient  >50% time spent counseling; reviewing medical record; tests; labs; documentation and developing future plan of care.   Roxan BROCKS Aniqa Hare, NP      [1]  Allergies Allergen Reactions   Ceftriaxone  Rash    Macular rash with itchness   Ibuprofen Other (See Comments)    History of GIB with ibuprofen    Sulfacetamide Other (See Comments)    Pt can tolerate sulfacetamide 500mg     Neomycin-Bacitracin Zn-Polymyx     Other reaction(s): Other (See Comments)   Spironolactone      Hyperkalemia

## 2024-05-01 ENCOUNTER — Ambulatory Visit: Payer: Self-pay | Admitting: Family

## 2024-05-04 NOTE — Telephone Encounter (Signed)
 Several unsuccessful attempts made with no response. Letter has been mailed.

## 2024-05-14 ENCOUNTER — Emergency Department

## 2024-05-14 ENCOUNTER — Emergency Department
Admission: EM | Admit: 2024-05-14 | Discharge: 2024-05-14 | Disposition: A | Discharge status: Home / Self Care | Attending: Emergency Medicine | Admitting: Emergency Medicine

## 2024-05-14 ENCOUNTER — Other Ambulatory Visit: Payer: Self-pay

## 2024-05-14 DIAGNOSIS — I2510 Atherosclerotic heart disease of native coronary artery without angina pectoris: Secondary | ICD-10-CM | POA: Diagnosis not present

## 2024-05-14 DIAGNOSIS — I509 Heart failure, unspecified: Secondary | ICD-10-CM | POA: Insufficient documentation

## 2024-05-14 DIAGNOSIS — R112 Nausea with vomiting, unspecified: Secondary | ICD-10-CM | POA: Diagnosis present

## 2024-05-14 DIAGNOSIS — N189 Chronic kidney disease, unspecified: Secondary | ICD-10-CM | POA: Diagnosis not present

## 2024-05-14 DIAGNOSIS — R197 Diarrhea, unspecified: Secondary | ICD-10-CM | POA: Insufficient documentation

## 2024-05-14 DIAGNOSIS — E875 Hyperkalemia: Secondary | ICD-10-CM | POA: Diagnosis not present

## 2024-05-14 DIAGNOSIS — I130 Hypertensive heart and chronic kidney disease with heart failure and stage 1 through stage 4 chronic kidney disease, or unspecified chronic kidney disease: Secondary | ICD-10-CM | POA: Diagnosis not present

## 2024-05-14 DIAGNOSIS — E860 Dehydration: Secondary | ICD-10-CM | POA: Insufficient documentation

## 2024-05-14 LAB — URINALYSIS, ROUTINE W REFLEX MICROSCOPIC
Bilirubin Urine: NEGATIVE
Glucose, UA: 50 mg/dL — AB
Hgb urine dipstick: NEGATIVE
Ketones, ur: NEGATIVE mg/dL
Nitrite: NEGATIVE
Protein, ur: 30 mg/dL — AB
Specific Gravity, Urine: 1.014 (ref 1.005–1.030)
pH: 5 (ref 5.0–8.0)

## 2024-05-14 LAB — CBC
HCT: 51.7 % (ref 39.0–52.0)
Hemoglobin: 16.9 g/dL (ref 13.0–17.0)
MCH: 26.8 pg (ref 26.0–34.0)
MCHC: 32.7 g/dL (ref 30.0–36.0)
MCV: 81.9 fL (ref 80.0–100.0)
Platelets: 333 K/uL (ref 150–400)
RBC: 6.31 MIL/uL — ABNORMAL HIGH (ref 4.22–5.81)
RDW: 14.6 % (ref 11.5–15.5)
WBC: 11 K/uL — ABNORMAL HIGH (ref 4.0–10.5)
nRBC: 0 % (ref 0.0–0.2)

## 2024-05-14 LAB — COMPREHENSIVE METABOLIC PANEL WITH GFR
ALT: 27 U/L (ref 0–44)
AST: 20 U/L (ref 15–41)
Albumin: 4.5 g/dL (ref 3.5–5.0)
Alkaline Phosphatase: 125 U/L (ref 38–126)
Anion gap: 12 (ref 5–15)
BUN: 43 mg/dL — ABNORMAL HIGH (ref 6–20)
CO2: 18 mmol/L — ABNORMAL LOW (ref 22–32)
Calcium: 10 mg/dL (ref 8.9–10.3)
Chloride: 108 mmol/L (ref 98–111)
Creatinine, Ser: 2.02 mg/dL — ABNORMAL HIGH (ref 0.61–1.24)
GFR, Estimated: 38 mL/min — ABNORMAL LOW
Glucose, Bld: 228 mg/dL — ABNORMAL HIGH (ref 70–99)
Potassium: 5.3 mmol/L — ABNORMAL HIGH (ref 3.5–5.1)
Sodium: 138 mmol/L (ref 135–145)
Total Bilirubin: 0.7 mg/dL (ref 0.0–1.2)
Total Protein: 8.1 g/dL (ref 6.5–8.1)

## 2024-05-14 LAB — LIPASE, BLOOD: Lipase: 59 U/L — ABNORMAL HIGH (ref 11–51)

## 2024-05-14 MED ORDER — AZITHROMYCIN 500 MG PO TABS
500.0000 mg | ORAL_TABLET | Freq: Once | ORAL | Status: AC
  Administered 2024-05-14: 500 mg via ORAL
  Filled 2024-05-14: qty 1

## 2024-05-14 MED ORDER — ONDANSETRON HCL 4 MG/2ML IJ SOLN
4.0000 mg | Freq: Once | INTRAMUSCULAR | Status: AC
  Administered 2024-05-14: 4 mg via INTRAVENOUS
  Filled 2024-05-14: qty 2

## 2024-05-14 MED ORDER — IOHEXOL 300 MG/ML  SOLN
80.0000 mL | Freq: Once | INTRAMUSCULAR | Status: AC | PRN
  Administered 2024-05-14: 80 mL via INTRAVENOUS

## 2024-05-14 MED ORDER — AZITHROMYCIN 500 MG PO TABS: 500.0000 mg | ORAL_TABLET | Freq: Every day | ORAL | 0 refills | Status: AC

## 2024-05-14 MED ORDER — SODIUM CHLORIDE 0.9 % IV BOLUS
1000.0000 mL | Freq: Once | INTRAVENOUS | Status: AC
  Administered 2024-05-14: 1000 mL via INTRAVENOUS

## 2024-05-14 MED ORDER — ONDANSETRON 8 MG PO TBDP: 8.0000 mg | ORAL_TABLET | Freq: Three times a day (TID) | ORAL | 0 refills | Status: AC | PRN

## 2024-05-14 NOTE — ED Triage Notes (Signed)
 Pt comes with diarrhea for few weeks. Pt does have ostomy bag. Pt state tingling in toes and some vomiting.

## 2024-05-14 NOTE — ED Provider Notes (Signed)
 "  [facility] Provider Note   Event Date/Time   First MD Initiated Contact with Patient 05/14/24 1427     (approximate) History  Emesis  HPI Clarence [name] is a 53 y.o. male with a past medical history of Crohn's disease with ileostomy in place, heart failure, hypertension, CKD, and CAD who presents complaining of diarrhea over the last 3 weeks with associated vomiting intermittently.  Patient denies any associated abdominal pain.  Patient states that this symptoms began after eating a meal of food out of the ordinary. ROS: Patient currently denies any vision changes, tinnitus, difficulty speaking, facial droop, sore throat, chest pain, shortness of breath, abdominal pain, dysuria, or weakness/numbness/paresthesias in any extremity   Physical Exam  Triage Vital Signs: ED Triage Vitals  Encounter Vitals Group     BP 05/14/24 1325 (!) 132/99     Girls Systolic BP Percentile --      Girls Diastolic BP Percentile --      Boys Systolic BP Percentile --      Boys Diastolic BP Percentile --      Pulse Rate 05/14/24 1325 (!) 107     Resp 05/14/24 1325 18     Temp 05/14/24 1325 98.3 F (36.8 C)     Temp Source 05/14/24 1325 Oral     SpO2 05/14/24 1325 98 %     Weight 05/14/24 1326 240 lb (108.9 kg)     Height 05/14/24 1326 5' 9 (1.753 m)     Head Circumference --      Peak Flow --      Pain Score 05/14/24 1328 0     Pain Loc --      Pain Education --      Exclude from Growth Chart --    Most recent vital signs: Vitals:   05/14/24 1325 05/14/24 1824  BP: (!) 132/99 130/86  Pulse: (!) 107 78  Resp: 18 18  Temp: 98.3 F (36.8 C) 98.6 F (37 C)  SpO2: 98% 97%   General: Awake, oriented x4. CV:  Good peripheral perfusion. Resp:  Normal effort. Abd:  No distention.  Nontender to palpation, ileostomy in place draining brown stool without any evidence of bleeding Other:  Middle-aged overweight Caucasian male resting comfortably in no acute distress ED  Results / Procedures / Treatments  Labs (all labs ordered are listed, but only abnormal results are displayed) Labs Reviewed  LIPASE, BLOOD - Abnormal; Notable for the following components:      Result Value   Lipase 59 (*)    All other components within normal limits  COMPREHENSIVE METABOLIC PANEL WITH GFR - Abnormal; Notable for the following components:   Potassium 5.3 (*)    CO2 18 (*)    Glucose, Bld 228 (*)    BUN 43 (*)    Creatinine, Ser 2.02 (*)    GFR, Estimated 38 (*)    All other components within normal limits  CBC - Abnormal; Notable for the following components:   WBC 11.0 (*)    RBC 6.31 (*)    All other components within normal limits  URINALYSIS, ROUTINE W REFLEX MICROSCOPIC - Abnormal; Notable for the following components:   Color, Urine YELLOW (*)    APPearance HAZY (*)    Glucose, UA 50 (*)    Protein, ur 30 (*)    Leukocytes,Ua SMALL (*)    Bacteria, UA RARE (*)    All other components within normal limits   RADIOLOGY  ED MD interpretation: CT of the abdomen and pelvis with IV contrast shows no abnormality to explain the reported left lower quadrant abdominal pain and diarrhea.  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  There is thickening of the walls of the distal esophagus with mild associated fat stranding suggesting esophagitis  - All radiology independently interpreted and agree with radiology assessment Official radiology report(s): CT ABDOMEN PELVIS W CONTRAST Result Date: 05/14/2024 CLINICAL DATA:  Left lower quadrant abdominal pain and diarrhea. EXAM: CT ABDOMEN AND PELVIS WITH CONTRAST TECHNIQUE: Multidetector CT imaging of the abdomen and pelvis was performed using the standard protocol following bolus administration of intravenous contrast. RADIATION DOSE REDUCTION: This exam was performed according to the departmental dose-optimization program  which includes automated exposure control, adjustment of the mA and/or kV according to patient size and/or use of iterative reconstruction technique. CONTRAST:  80mL OMNIPAQUE  IOHEXOL  300 MG/ML  SOLN COMPARISON:  01/26/2024. FINDINGS: Lower chest: There is mild thickening of the walls of the distal esophagus with subtle fat stranding. The lung bases are clear. Hepatobiliary: No focal lesion is seen within the liver. No biliary ductal dilatation. The gallbladder is without stones. Pancreas: Unremarkable. No pancreatic ductal dilatation or surrounding inflammatory changes. Spleen: Normal in size without focal abnormality. Adrenals/Urinary Tract: The adrenal glands are within normal limits. Cysts are present in the right kidney. No renal calculus or hydronephrosis bilaterally. The bladder is unremarkable. Stomach/Bowel: The stomach is within normal limits. No bowel obstruction, free air, or pneumatosis is seen. There is evidence of prior colectomy with right lower quadrant ileostomy in peristomal herniation of loops of small bowel. No acute inflammatory changes are seen. Vascular/Lymphatic: No significant vascular findings are present. No enlarged abdominal or pelvic lymph nodes. Reproductive: Prostate is unremarkable. Other: No abdominopelvic ascites. Musculoskeletal: Degenerative changes are noted in the thoracolumbar spine. No acute osseous abnormality. IMPRESSION: 1. No abnormality to explain reported left lower quadrant pain and diarrhea. 2. 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 No bowel obstruction. 3. Thickening of the walls of the distal esophagus with mild associated fat stranding suggesting esophagitis. Electronically Signed   By: Leita Birmingham M.D.   On: 05/14/2024 16:50   PROCEDURES: Critical Care performed: No Procedures MEDICATIONS ORDERED IN ED: Medications  sodium chloride  0.9 % bolus 1,000 mL (0 mLs Intravenous Stopped  05/14/24 1713)  ondansetron  (ZOFRAN ) injection 4 mg (4 mg Intravenous Given 05/14/24 1446)  iohexol  (OMNIPAQUE ) 300 MG/ML solution 80 mL (80 mLs Intravenous Contrast Given 05/14/24 1457)  azithromycin  (ZITHROMAX ) tablet 500 mg (500 mg Oral Given 05/14/24 1815)   IMPRESSION / MDM / ASSESSMENT AND PLAN / ED COURSE  I reviewed the triage vital signs and the nursing notes.                             The patient is on the cardiac monitor to evaluate for evidence of arrhythmia and/or significant heart rate changes. Patient's presentation is most consistent with acute presentation with potential threat to life or bodily function. Patient is a 53 year old male with the above-stated past medical history who presents complaining of nausea/vomiting/diarrhea that is been present over the last few weeks. DDx: Crohn's flare, infectious diarrhea, gastroenteritis, small bowel obstruction Plan: CBC, CMP, UA, lipase, CT abdomen pelvis with IV contrast  CT scan does not show any  evidence of acute abnormalities that would explain patient's diarrhea.  Patient does show evidence of worsening renal function with creatinine of 2.02 and mildly elevated potassium at 5.3.  Likely secondary to dehydration and patient was offered admission however states that this has happened to him in the past and he just needs to rehydrate.  I believe patient will be stable for follow-up as he is p.o. tolerant at this time.  Patient was treated empirically for possible bacterial diarrhea with azithromycin  given the duration of symptoms.  Patient encouraged to follow-up with his primary care physician as well as GI for any continued symptoms.  Dispo: Discharge home with PCP follow-up   FINAL CLINICAL IMPRESSION(S) / ED DIAGNOSES   Final diagnoses:  Dehydration  Nausea vomiting and diarrhea   Rx / DC Orders   ED Discharge Orders          Ordered    azithromycin  (ZITHROMAX ) 500 MG tablet  Daily        05/14/24 1803     ondansetron  (ZOFRAN -ODT) 8 MG disintegrating tablet  Every 8 hours PRN        05/14/24 1803           Note:  This document was prepared using Dragon voice recognition software and may include unintentional dictation errors.   Jossie Artist POUR, MD 05/14/24 1902  "

## 2024-05-17 ENCOUNTER — Emergency Department
Admission: EM | Admit: 2024-05-17 | Discharge: 2024-05-17 | Disposition: A | Discharge status: Home / Self Care | Attending: Emergency Medicine | Admitting: Emergency Medicine

## 2024-05-17 ENCOUNTER — Emergency Department

## 2024-05-17 ENCOUNTER — Other Ambulatory Visit: Payer: Self-pay

## 2024-05-17 DIAGNOSIS — I2510 Atherosclerotic heart disease of native coronary artery without angina pectoris: Secondary | ICD-10-CM | POA: Diagnosis not present

## 2024-05-17 DIAGNOSIS — I130 Hypertensive heart and chronic kidney disease with heart failure and stage 1 through stage 4 chronic kidney disease, or unspecified chronic kidney disease: Secondary | ICD-10-CM | POA: Insufficient documentation

## 2024-05-17 DIAGNOSIS — N189 Chronic kidney disease, unspecified: Secondary | ICD-10-CM | POA: Insufficient documentation

## 2024-05-17 DIAGNOSIS — K209 Esophagitis, unspecified without bleeding: Secondary | ICD-10-CM | POA: Diagnosis not present

## 2024-05-17 DIAGNOSIS — R0602 Shortness of breath: Secondary | ICD-10-CM

## 2024-05-17 DIAGNOSIS — I509 Heart failure, unspecified: Secondary | ICD-10-CM | POA: Diagnosis not present

## 2024-05-17 DIAGNOSIS — R079 Chest pain, unspecified: Secondary | ICD-10-CM

## 2024-05-17 LAB — HEPATIC FUNCTION PANEL
ALT: 24 U/L (ref 0–44)
AST: 20 U/L (ref 15–41)
Albumin: 4.3 g/dL (ref 3.5–5.0)
Alkaline Phosphatase: 108 U/L (ref 38–126)
Bilirubin, Direct: 0.2 mg/dL (ref 0.0–0.2)
Indirect Bilirubin: 0.4 mg/dL (ref 0.3–0.9)
Total Bilirubin: 0.6 mg/dL (ref 0.0–1.2)
Total Protein: 7.3 g/dL (ref 6.5–8.1)

## 2024-05-17 LAB — D-DIMER, QUANTITATIVE: D-Dimer, Quant: 1.62 ug{FEU}/mL — ABNORMAL HIGH (ref 0.00–0.50)

## 2024-05-17 LAB — BASIC METABOLIC PANEL WITH GFR
Anion gap: 12 (ref 5–15)
BUN: 31 mg/dL — ABNORMAL HIGH (ref 6–20)
CO2: 21 mmol/L — ABNORMAL LOW (ref 22–32)
Calcium: 9.6 mg/dL (ref 8.9–10.3)
Chloride: 107 mmol/L (ref 98–111)
Creatinine, Ser: 1.56 mg/dL — ABNORMAL HIGH (ref 0.61–1.24)
GFR, Estimated: 52 mL/min — ABNORMAL LOW
Glucose, Bld: 119 mg/dL — ABNORMAL HIGH (ref 70–99)
Potassium: 4.5 mmol/L (ref 3.5–5.1)
Sodium: 141 mmol/L (ref 135–145)

## 2024-05-17 LAB — CBC
HCT: 47.3 % (ref 39.0–52.0)
Hemoglobin: 15.6 g/dL (ref 13.0–17.0)
MCH: 26.8 pg (ref 26.0–34.0)
MCHC: 33 g/dL (ref 30.0–36.0)
MCV: 81.1 fL (ref 80.0–100.0)
Platelets: 284 K/uL (ref 150–400)
RBC: 5.83 MIL/uL — ABNORMAL HIGH (ref 4.22–5.81)
RDW: 14.5 % (ref 11.5–15.5)
WBC: 9.3 K/uL (ref 4.0–10.5)
nRBC: 0 % (ref 0.0–0.2)

## 2024-05-17 LAB — TROPONIN T, HIGH SENSITIVITY
Troponin T High Sensitivity: 28 ng/L — ABNORMAL HIGH (ref 0–19)
Troponin T High Sensitivity: 31 ng/L — ABNORMAL HIGH (ref 0–19)

## 2024-05-17 LAB — LIPASE, BLOOD: Lipase: 68 U/L — ABNORMAL HIGH (ref 11–51)

## 2024-05-17 LAB — PRO BRAIN NATRIURETIC PEPTIDE: Pro Brain Natriuretic Peptide: 138 pg/mL

## 2024-05-17 MED ORDER — IOHEXOL 350 MG/ML SOLN
75.0000 mL | Freq: Once | INTRAVENOUS | Status: AC | PRN
  Administered 2024-05-17: 75 mL via INTRAVENOUS

## 2024-05-17 MED ORDER — OMEPRAZOLE MAGNESIUM 20 MG PO TBEC: 20.0000 mg | DELAYED_RELEASE_TABLET | Freq: Every day | ORAL | 0 refills | Status: AC

## 2024-05-17 MED ORDER — SUCRALFATE 1 GM/10ML PO SUSP: 1.0000 g | Freq: Four times a day (QID) | ORAL | 1 refills | Status: AC

## 2024-05-17 MED ORDER — ASPIRIN 81 MG PO CHEW
324.0000 mg | CHEWABLE_TABLET | Freq: Once | ORAL | Status: AC
  Administered 2024-05-17: 324 mg via ORAL
  Filled 2024-05-17: qty 4

## 2024-05-17 MED ORDER — LIDOCAINE VISCOUS HCL 2 % MT SOLN
15.0000 mL | Freq: Once | OROMUCOSAL | Status: AC
  Administered 2024-05-17: 15 mL via ORAL
  Filled 2024-05-17: qty 15

## 2024-05-17 MED ORDER — ALUM & MAG HYDROXIDE-SIMETH 200-200-20 MG/5ML PO SUSP
30.0000 mL | Freq: Once | ORAL | Status: AC
  Administered 2024-05-17: 30 mL via ORAL
  Filled 2024-05-17: qty 30

## 2024-05-17 NOTE — Discharge Instructions (Addendum)
 Talk to your gastroenterologist before starting omeprazole.  Can start Sucralfate.  Return to the emergency department for any ongoing or worsening symptoms.  Follow-up with your cardiologist.

## 2024-05-17 NOTE — ED Provider Notes (Signed)
 "  [facility] Provider Note    Event Date/Time   First MD Initiated Contact with Patient 05/17/24 [phone]     (approximate)   History   Chest Pain   HPI  Clarence [name] is a 53 y.o. male   Past medical history of CAD with previous stent, Crohn's disease with ileostomy, CKD, pulmonary artery hypertension, mixed ischemic and nonischemic cardiomyopathy, CHF, here with substernal chest pain and shortness of breath on exertion.  Occurrence happened just this morning when he awoke from sleep.  He took a nitro which helped transiently.  He went to bed last night feeling all right.  He has just been recovering from a bout of nausea vomiting diarrhea.  Has had no more diarrhea or vomiting within the last few days.  During his recent bout of GI illness he had no GI bleeding.  He reports no respiratory infectious symptoms.  He reports no unilateral leg swelling.  He has been compliant with all medications.   External Medical Documents Reviewed: Recent hospital notes, recent CT abdomen obtained 2 days ago noting Thickening of the walls of the distal esophagus with mild associated fat stranding suggesting esophagitis.      Physical Exam   Triage Vital Signs: ED Triage Vitals  Encounter Vitals Group     BP 05/17/24 0531 125/89     Girls Systolic BP Percentile --      Girls Diastolic BP Percentile --      Boys Systolic BP Percentile --      Boys Diastolic BP Percentile --      Pulse Rate 05/17/24 0531 77     Resp 05/17/24 0531 18     Temp 05/17/24 0531 98.7 F (37.1 C)     Temp src --      SpO2 05/17/24 0531 100 %     Weight 05/17/24 0530 230 lb (104.3 kg)     Height 05/17/24 0530 5' 9 (1.753 m)     Head Circumference --      Peak Flow --      Pain Score 05/17/24 0530 2     Pain Loc --      Pain Education --      Exclude from Growth Chart --     Most recent vital signs: Vitals:   05/17/24 0531 05/17/24 0638  BP: 125/89 101/78  Pulse: 77  69  Resp: 18 16  Temp: 98.7 F (37.1 C)   SpO2: 100% 96%    General: Awake, no distress.  CV:  Good peripheral perfusion.  Resp:  Normal effort.  Abd:  No distention.  Other:  Comfortable appearing patient in no respiratory distress no acute distress with normal vital signs afebrile no hypoxemia breathing quickly on room air.  Lung exam with no obvious focalities wheezing or rales.  Good air movement throughout.  Heart without murmurs.  No significant peripheral edema noted.  Soft benign abdominal exam throughout.   ED Results / Procedures / Treatments   Labs (all labs ordered are listed, but only abnormal results are displayed) Labs Reviewed  CBC - Abnormal; Notable for the following components:      Result Value   RBC 5.83 (*)    All other components within normal limits  D-DIMER, QUANTITATIVE - Abnormal; Notable for the following components:   D-Dimer, Quant 1.62 (*)    All other components within normal limits  BASIC METABOLIC PANEL WITH GFR  HEPATIC FUNCTION PANEL  LIPASE, BLOOD  PRO  BRAIN NATRIURETIC PEPTIDE  TROPONIN T, HIGH SENSITIVITY     I ordered and reviewed the above labs they are notable for elevated D-dimer  EKG  ED ECG REPORT I, Ginnie Shams, the attending physician, personally viewed and interpreted this ECG.   Date: 05/17/2024  EKG Time: 0528  Rate: 74  Rhythm: sinus  Axis: nl  Intervals:nl  ST&T Change: no stemi    RADIOLOGY I independently reviewed and interpreted chest x-ray and I see no obvious focality nor pneumothorax I also reviewed radiologist's formal read.   PROCEDURES:  Critical Care performed: No  Procedures   MEDICATIONS ORDERED IN ED: Medications  aspirin  chewable tablet 324 mg (324 mg Oral Given 05/17/24 0546)  alum & mag hydroxide-simeth (MAALOX/MYLANTA) 200-200-20 MG/5ML suspension 30 mL (30 mLs Oral Given 05/17/24 9362)    And  lidocaine  (XYLOCAINE ) 2 % viscous mouth solution 15 mL (15 mLs Oral Given 05/17/24 9362)      IMPRESSION / MDM / ASSESSMENT AND PLAN / ED COURSE  I reviewed the triage vital signs and the nursing notes.                                Patient's presentation is most consistent with acute presentation with potential threat to life or bodily function.  Differential diagnosis includes, but is not limited to, ACS, PE, dissection, GERD esophagitis, pancreatitis, biliary pathologies, intra-abdominal infections or obstruction   The patient is on the cardiac monitor to evaluate for evidence of arrhythmia and/or significant heart rate changes.  MDM:    Here is a patient with a history of cardiac disease and CHF with chest pain and exertional dyspnea starting this morning.  I worry about ACS, CHF exacerbation or PE.  Fortunately EKG shows no STEMI or acute ischemic changes but will follow-up with serial troponins.  Give aspirin  now.  May represent PE as well, low initial clinical suspicion will start with D-dimer.  Had recent GI illness nausea vomiting diarrhea and now resolved and a benign abdominal exam and a normal CT obtained very recently of the abdomen pelvis doubt intra-abdominal pathologies.  However the substernal pain may be due to irritation due to recent profuse vomiting, will give GI cocktail now.  This is further substantiated by evidence of esophagitis changes noted on CT scan from just 2 days ago- I considered esophageal rupture but given his very well appearance, no crepitus on exam, and no reported GI bleeding, I think very unlikely.   Dimer was elevated so ordered a CT angio of the chest to check for PE.  Patient disposition will be decided upon completion of evaluation as above, and reassessment of symptoms.       FINAL CLINICAL IMPRESSION(S) / ED DIAGNOSES   Final diagnoses:  Nonspecific chest pain  Shortness of breath  Esophagitis     Rx / DC Orders   ED Discharge Orders          Ordered    sucralfate (CARAFATE) 1 GM/10ML suspension  4 times daily         05/17/24 9388             Note:  This document was prepared using Dragon voice recognition software and may include unintentional dictation errors.    Shams Ginnie, MD 05/17/24 [phone]  "

## 2024-05-17 NOTE — ED Triage Notes (Signed)
 Pt reports chest pain that started last night, pt repots hx heart failure. Pt took 1 nitroglycerin  and entresto  pta that eased the pain. Pt was sick recently with n/v/d.

## 2024-05-17 NOTE — ED Provider Notes (Signed)
 Care assumed of patient from outgoing provider.  See their note for initial history, exam and plan.  Clinical Course as of 05/17/24 0911  Wed May 17, 2024  0705 Chest pain and exertional dyspnea that started today - nitroglycerin  and aspirin .  Recent n/v/d - esophagitis on recent CT scan.  Initial trop likely at baseline. Dimer +  []  CTA []  delta trop - likely dc home if w/u stable and neg w/ treatment for esophagitis. [SM]  0911 CTA with no signs of pulmonary embolism.  Second troponin remained stable at 31.  On reevaluation patient states that he is feeling better and just tired.  No ongoing chest pain.  Significant concern for esophagitis.  Will start on a PPI but has a procedure with GI on the fifth so discussed following up with his GI physician and make sure that that he is okay to start this medication for his procedure.  Discussed return precautions.  No questions at time of discharge. [SM]    Clinical Course User Index [SM] Suzanne Kirsch, MD     Suzanne Kirsch, MD 05/17/24 [phone]

## 2024-05-21 NOTE — Progress Notes (Deleted)
 [facility] History and Physical   Primary Care Physician:  Ngetich, Roxan BROCKS, NP   Reason for Procedure:  History of Crohn's disease diagnosed 1990 status post total abdominal colectomy with end ileostomy 2008, nausea, increased ileostomy output, recent CT with findings suggestive of esophagitis  Plan:    Upper endoscopy, ileoscopy, possible proctoscopy   The patient was provided an opportunity to ask questions and all were answered. The patient agreed with the plan.   HPI: Clarence [name] is a 54 y.o. male undergoing upper endoscopy, ileoscopy and possible proctoscopy for investigation of history of Crohn's disease, nausea, increased ileostomy output and recent CT scan showing possible esophagitis.  Patient was diagnosed with Crohn's disease in 1990 previously managed at [facility].  Records suggest he underwent total abdominal colectomy with end ileostomy for stricturing Crohn's disease with associated perforation.  Noted that after his colectomy there is a pathology report from 2009 for a flexible sigmoidoscopy with rectal biopsies.  Query if patient has retained rectum or not after colectomy.  Seen in clinic for symptoms of nausea and increased ileoscopy output.  No evidence of celiac disease, adrenal insufficiency or chronic stool infections.  Patient has had recent ER visits in December 2025 for abdominal pain, diarrhea and chest pain.  CTAP has not shown abnormalities to explain left lower quadrant abdominal pain and diarrhea.  There was thickening of the distal esophagus suggestive of esophagitis.  Recent CT angiogram was without pulmonary embolus.  Patient has a history of CAD status post PCI with DES 02/2023 and is on Plavix .  Also with CHF-echocardiogram 07/2023 with EF 45-50%   Past Medical History:  Diagnosis Date   Ascending Aortic Dilation    a. 09/2021 Echo: Asc Ao 40mm.   CAD (coronary artery disease)    a. 07/2021 Cath: LM nl, LAD 22m, D2 40, LCX 65p/m, OM1 90, RCA  149m w/ L-.R collats to RPDA from septal 1/2-->Med rx.   Chronic HFrEF (heart failure with reduced ejection fraction) (HCC)    a. 07/2021 Echo: EF 30-35%, glob HK, mod LVH, GrII DD, nl RV fxn, mildly dil LA, mild-mod MR; b. 09/2021 Echo: EF 35-40%, glob HK, mod LVH, GrI DD, nl RV fxn, mildly dil RA, mild MR, Asc Ao 40mm.   CKD (chronic kidney disease) stage 2, GFR 60-89 ml/min    Crohn's disease (HCC)    Hypertension    Mixed Ischemic & Nonischemic cardiomyopathy    a. 07/2021 Echo: EF 30-35%; b. 07/2021 Cath: Occluded RCA w/ mod LAD/LCX dzs, and severe OM1 dzs-->Med Rx; c. 09/2021 Echo: EF 35-40%.   PAH (pulmonary artery hypertension) (HCC)    a. 07/2021 RHC: PA 67/30 (42).   Proteinuria     Past Surgical History:  Procedure Laterality Date   COLECTOMY     CORONARY STENT INTERVENTION N/A 03/02/2023   Procedure: CORONARY STENT INTERVENTION;  Surgeon: Mady Bruckner, MD;  Location: [facility] INVASIVE CV LAB;  Service: Cardiovascular;  Laterality: N/A;   LEFT HEART CATH AND CORONARY ANGIOGRAPHY Left 03/02/2023   Procedure: LEFT HEART CATH AND CORONARY ANGIOGRAPHY;  Surgeon: Mady Bruckner, MD;  Location: [facility] INVASIVE CV LAB;  Service: Cardiovascular;  Laterality: Left;   RIGHT/LEFT HEART CATH AND CORONARY ANGIOGRAPHY N/A 08/07/2021   Procedure: RIGHT/LEFT HEART CATH AND CORONARY ANGIOGRAPHY;  Surgeon: Mady Bruckner, MD;  Location: [facility] INVASIVE CV LAB;  Service: Cardiovascular;  Laterality: N/A;   RIGHT/LEFT HEART CATH AND CORONARY ANGIOGRAPHY N/A 09/29/2022   Procedure: RIGHT/LEFT HEART CATH AND CORONARY ANGIOGRAPHY;  Surgeon: Rolan Ezra RAMAN, MD;  Location[facility] INVASIVE CV LAB;  Service: Cardiovascular;  Laterality: N/A;    Prior to Admission medications  Medication Sig Start Date End Date Taking? Authorizing Provider  acetaminophen  (TYLENOL ) 650 MG CR tablet Take 650 mg by mouth as needed for pain.    [provider]  atorvastatin  (LIPITOR ) 80 MG tablet Take 1 tablet (80 mg total)  by mouth every evening. 04/27/24   Ngetich, Dinah C, NP  BAYER ASPIRIN  PO Take 81 mg by mouth as needed.    [provider]  carvedilol  (COREG ) 6.25 MG tablet Take 1.5 mg by mouth 2 (two) times daily with a meal.    [provider]  clopidogrel  (PLAVIX ) 75 MG tablet Take 1 tablet (75 mg total) by mouth daily. 12/07/23 12/01/24  Donette Ellouise LABOR, [name]  cyanocobalamin  (VITAMIN B12) 1000 MCG tablet Take 1 tablet (1,000 mcg total) by mouth daily. Patient not taking: Reported on 04/27/2024 02/09/24 08/07/24  Von Bellis, MD  famotidine  (PEPCID ) 40 MG tablet Take 1 tablet (40 mg total) by mouth every evening. 01/25/24 01/24/25  Mayo, Darice LABOR, [name]  furosemide  (LASIX ) 20 MG tablet Take 1 tablet (20 mg total) by mouth daily as needed. For weight gain of 3 lb in 24 hours or 5 lbs in one week 10/20/23   Maree Hue, MD  isosorbide  mononitrate (IMDUR ) 60 MG 24 hr tablet Take 1 tablet (60 mg total) by mouth daily. Take 1 tab every morning and 1/2 tab every evening 02/04/24   Donette Ellouise LABOR, [name]  JARDIANCE  10 MG TABS tablet TAKE ONE TABLET BY MOUTH DAILY 03/15/24   McLean, Dalton S, MD  Multiple Vitamins-Minerals (CENTRUM ADULT PO) Take by mouth daily.    [provider]  omeprazole  (PRILOSEC  OTC) 20 MG tablet Take 1 tablet (20 mg total) by mouth daily. 05/17/24 06/16/24  Suzanne Kirsch, MD  ondansetron  (ZOFRAN -ODT) 8 MG disintegrating tablet Take 1 tablet (8 mg total) by mouth every 8 (eight) hours as needed for nausea or vomiting. 05/14/24   Bradler, Evan K, MD  ranolazine  (RANEXA ) 500 MG 12 hr tablet Take 1 tablet (500 mg total) by mouth 2 (two) times daily. 04/27/24   Ngetich, Dinah C, NP  sacubitril -valsartan  (ENTRESTO ) 24-26 MG Take 1 tablet by mouth 2 (two) times daily.    [provider]  spironolactone  (ALDACTONE ) 25 MG tablet Take 25 mg by mouth daily.    [provider]  sucralfate  (CARAFATE ) 1 GM/10ML suspension Take 10 mLs (1 g total) by mouth 4 (four) times daily.  05/17/24 05/17/25  Cyrena Mylar, MD    Current Outpatient Medications  Medication Sig Dispense Refill   acetaminophen  (TYLENOL ) 650 MG CR tablet Take 650 mg by mouth as needed for pain.     atorvastatin  (LIPITOR ) 80 MG tablet Take 1 tablet (80 mg total) by mouth every evening. 90 tablet 3   BAYER ASPIRIN  PO Take 81 mg by mouth as needed.     carvedilol  (COREG ) 6.25 MG tablet Take 1.5 mg by mouth 2 (two) times daily with a meal.     clopidogrel  (PLAVIX ) 75 MG tablet Take 1 tablet (75 mg total) by mouth daily. 30 tablet 11   cyanocobalamin  (VITAMIN B12) 1000 MCG tablet Take 1 tablet (1,000 mcg total) by mouth daily. (Patient not taking: Reported on 04/27/2024) 90 tablet 1   famotidine  (PEPCID ) 40 MG tablet Take 1 tablet (40 mg total) by mouth every evening. 30 tablet 1   furosemide  (LASIX )  20 MG tablet Take 1 tablet (20 mg total) by mouth daily as needed. For weight gain of 3 lb in 24 hours or 5 lbs in one week 90 tablet 1   isosorbide  mononitrate (IMDUR ) 60 MG 24 hr tablet Take 1 tablet (60 mg total) by mouth daily. Take 1 tab every morning and 1/2 tab every evening 45 tablet 3   JARDIANCE  10 MG TABS tablet TAKE ONE TABLET BY MOUTH DAILY 90 tablet 2   Multiple Vitamins-Minerals (CENTRUM ADULT PO) Take by mouth daily.     omeprazole  (PRILOSEC  OTC) 20 MG tablet Take 1 tablet (20 mg total) by mouth daily. 30 tablet 0   ondansetron  (ZOFRAN -ODT) 8 MG disintegrating tablet Take 1 tablet (8 mg total) by mouth every 8 (eight) hours as needed for nausea or vomiting. 20 tablet 0   ranolazine  (RANEXA ) 500 MG 12 hr tablet Take 1 tablet (500 mg total) by mouth 2 (two) times daily. 180 tablet 1   sacubitril -valsartan  (ENTRESTO ) 24-26 MG Take 1 tablet by mouth 2 (two) times daily.     spironolactone  (ALDACTONE ) 25 MG tablet Take 25 mg by mouth daily.     sucralfate  (CARAFATE ) 1 GM/10ML suspension Take 10 mLs (1 g total) by mouth 4 (four) times daily. 420 mL 1   No current facility-administered medications for  this visit.    Allergies as of 05/22/2024 - Review Complete 05/17/2024  Allergen Reaction Noted   Ceftriaxone  Rash 06/12/2023   Ibuprofen Other (See Comments) 09/08/2012   Sulfacetamide Other (See Comments) 09/08/2012   Neomycin-bacitracin zn-polymyx  09/08/2012   Spironolactone   02/17/2022    Family History  Problem Relation Age of Onset   Hypertension Mother    Cancer Maternal Aunt    Heart disease Maternal Uncle    Pancreatic cancer Maternal Uncle     Social History   Socioeconomic History   Marital status: Single    Spouse name: Not on file   Number of children: Not on file   Years of education: Not on file   Highest education level: Not on file  Occupational History   Occupation: funeral home  Tobacco Use   Smoking status: Former    Types: Cigars    Quit date: 08/13/2021    Years since quitting: 2.7   Smokeless tobacco: Not on file  Vaping Use   Vaping status: Never Used  Substance and Sexual Activity   Alcohol use: Not Currently    Alcohol/week: 1.0 standard drink of alcohol    Types: 1 Standard drinks or equivalent per week   Drug use: Never   Sexual activity: Not Currently  Other Topics Concern   Not on file  Social History Narrative   Patient is a past smoker for 10 years, drinks decaf coffee and tea.   Patient is single and lives at home with his mother and uncle and has no pets.   Patient does not exercise daily due to difficulty walking distances.   Patient has no issues with his functional status and is independent in caring for himself   Social Drivers of Health   Tobacco Use: Medium Risk (05/17/2024)   Patient History    Smoking Tobacco Use: Former    Smokeless Tobacco Use: Unknown    Passive Exposure: Not on file  Financial Resource Strain: High Risk (01/29/2022)   Overall Financial Resource Strain (CARDIA)    Difficulty of Paying Living Expenses: Hard  Food Insecurity: No Food Insecurity (01/28/2024)   Epic    Worried  About Running Out of  Food in the Last Year: Never true    Ran Out of Food in the Last Year: Never true  Transportation Needs: No Transportation Needs (01/28/2024)   Epic    Lack of Transportation (Medical): No    Lack of Transportation (Non-Medical): No  Physical Activity: Not on file  Stress: Not on file  Social Connections: Unknown (09/29/2021)   Received from [facility]   Social Network    Social Network: Not on file  Intimate Partner Violence: Not At Risk (01/28/2024)   Epic    Fear of Current or Ex-Partner: No    Emotionally Abused: No    Physically Abused: No    Sexually Abused: No  Depression (PHQ2-9): Low Risk (04/27/2024)   Depression (PHQ2-9)    PHQ-2 Score: 0  Alcohol Screen: Not on file  Housing: Unknown (01/28/2024)   Epic    Unable to Pay for Housing in the Last Year: No    Number of Times Moved in the Last Year: Not on file    Homeless in the Last Year: No  Utilities: Not At Risk (01/28/2024)   Epic    Threatened with loss of utilities: No  Health Literacy: Not on file    Review of Systems:  All other review of systems negative except as mentioned in the HPI.  Physical Exam: Vital signs There were no vitals taken for this visit.  General:   Alert,  Well-developed, well-nourished, pleasant and cooperative in NAD Airway:  Mallampati  Lungs:  Clear throughout to auscultation.   Heart:  Regular rate and rhythm; no murmurs, clicks, rubs,  or gallops. Abdomen:  Soft, nontender and nondistended. Normal bowel sounds.   Neuro/Psych:  Normal mood and affect. A and O x 3  Inocente Hausen, MD [facility]

## 2024-05-22 ENCOUNTER — Encounter: Payer: Self-pay | Admitting: Pediatrics

## 2024-05-22 ENCOUNTER — Telehealth: Payer: Self-pay | Admitting: Pediatrics

## 2024-05-22 ENCOUNTER — Encounter: Admitting: Pediatrics

## 2024-05-22 NOTE — Telephone Encounter (Signed)
 Good morning Dr. Suzann,   I received a call from this patient stating that he would like to cancel his ENDO COLON today due to him not having any changes and feeling very uncomfortable to this point. Patient stated that as well that he does not want to deal with this anymore and would not like to reschedule at this point. Please see patient MyChart message today. Please advise.     Thank you.

## 2024-05-30 ENCOUNTER — Ambulatory Visit

## 2024-05-30 DIAGNOSIS — M1A3710 Chronic gout due to renal impairment, right ankle and foot, without tophus (tophi): Secondary | ICD-10-CM

## 2024-05-30 DIAGNOSIS — R609 Edema, unspecified: Secondary | ICD-10-CM

## 2024-05-30 DIAGNOSIS — N1831 Chronic kidney disease, stage 3a: Secondary | ICD-10-CM

## 2024-05-31 NOTE — Progress Notes (Signed)
 "  Subjective:  Patient ID: Clarence [name], male    DOB: [birth year],  MRN: [id number]  Chief Complaint  Patient presents with   Foot Pain    Patient presents today with right foot pain that began approximately three weeks ago. The patient denies any injury or trauma to the area and reports the pain started spontaneously. He describes the sensation as feeling like a bag of fluid under the foot. The patient has tried acetaminophen  (Tylenol ) for pain relief without improvement in symptoms.    Discussed the use of AI scribe software for clinical note transcription with the patient, who gave verbal consent to proceed.  History of Present Illness Clarence [name] is a 54 year old male with stage 3 chronic kidney disease and Crohn's disease who presents with acute swelling and soreness of the right first metatarsophalangeal joint.  He developed acute swelling and puffiness of the right first MTP joint with soreness and a pressure sensation, enough to cause a limp. He describes it as feeling full of fluid with a cushion-like tension under the joint. Symptoms have improved since onset, with only mild persistent puffiness. He denies erythema, allodynia, pain with direct pressure, and any wounds in the area.  He has stage 3 chronic kidney disease and Crohn's disease with dietary restrictions.  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     Review of Systems: Negative except as noted in the HPI. Denies N/V/F/Ch.  Past Medical History:  Diagnosis Date   Ascending Aortic Dilation    a. 09/2021 Echo: Asc Ao 40mm.   CAD (coronary artery disease)    a. 07/2021 Cath: LM nl, LAD 10m, D2 40, LCX 65p/m, OM1 90, RCA 183m w/ L-.R collats to RPDA from septal 1/2-->Med rx.   Chronic HFrEF (heart failure with reduced ejection fraction) (HCC)    a. 07/2021 Echo: EF 30-35%, glob HK, mod LVH, GrII DD, nl RV fxn, mildly dil LA, mild-mod MR; b. 09/2021 Echo: EF 35-40%, glob  HK, mod LVH, GrI DD, nl RV fxn, mildly dil RA, mild MR, Asc Ao 40mm.   CKD (chronic kidney disease) stage 2, GFR 60-89 ml/min    Crohn's disease (HCC)    Hypertension    Mixed Ischemic & Nonischemic cardiomyopathy    a. 07/2021 Echo: EF 30-35%; b. 07/2021 Cath: Occluded RCA w/ mod LAD/LCX dzs, and severe OM1 dzs-->Med Rx; c. 09/2021 Echo: EF 35-40%.   PAH (pulmonary artery hypertension) (HCC)    a. 07/2021 RHC: PA 67/30 (42).   Proteinuria    Current Medications[1]  Tobacco Use History[2]  Allergies[3] Objective:   Constitutional Well developed. Well nourished. Oriented to person, place, and time.  Vascular Dorsalis pedis pulses palpable bilaterally. Posterior tibial pulses palpable bilaterally. Capillary refill normal to all digits.  No cyanosis or clubbing noted. Pedal hair growth normal.  Neurologic Normal speech. Epicritic sensation to light touch grossly intact bilaterally. Negative tinel sign at tarsal tunnel bilaterally.   Dermatologic Skin texture and turgor are within normal limits.  No open wounds. No skin lesions.  Musculoskeletal: 5 out of 5 muscle strength all major pedal muscle groups.  Right first metatarsophalangeal joint is edematous.  There is pain-free range of motion.  Minimal pain to palpation periarticular.  Patient relates this was very painful prior.   Radiographs: Taken and reviewed.  3 views right foot.  These demonstrate increase soft tissue volume and density around the first metatarsophalangeal joint consistent with edema.  There are no cortical erosions or tophi identified.  No other acute osseous findings such as fracture or dislocation.  Mild joint space narrowing at the first metatarsophalangeal joint.    Assessment:   1. Swelling   2. 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 CKD stage 3a, GFR 45-59 ml/min (HCC)      Plan:  Patient was evaluated and treated and all questions answered.  Assessment and  Plan Assessment & Plan Acute gout attack of the right first metatarsophalangeal joint First episode of acute gout, now resolved with mild swelling. Chronic kidney disease stage 3 likely contributes to decreased uric acid excretion and increased gout risk. Presentation consistent with gout; dietary factors less likely primary contributors. - Ordered serum uric acid level to assess for hyperuricemia. - Advised notification if symptoms recur or another acute attack occurs. - Discussed potential initiation of uric acid-lowering therapy if hyperuricemia is confirmed, in coordination with primary care provider. He does have CKD and may be under-excreting. - Recommended dietary modifications to reduce alcohol and purine-rich foods. - Advised follow-up for persistent or worsening symptoms.  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 Symptoms mild and overshadowed by recent gout attack. - No intervention required at this time given mild symptoms and preserved joint function.  RTC PRN   Prentice Ovens, DPM AACFAS Fellowship Trained Podiatric Surgeon [facility]     [1]  Current Outpatient Medications:    acetaminophen  (TYLENOL ) 650 MG CR tablet, Take 650 mg by mouth as needed for pain., Disp: , Rfl:    atorvastatin  (LIPITOR ) 80 MG tablet, Take 1 tablet (80 mg total) by mouth every evening., Disp: 90 tablet, Rfl: 3   BAYER ASPIRIN  PO, Take 81 mg by mouth as needed., Disp: , Rfl:    carvedilol  (COREG ) 6.25 MG tablet, Take 1.5 mg by mouth 2 (two) times daily with a meal., Disp: , Rfl:    clopidogrel  (PLAVIX ) 75 MG tablet, Take 1 tablet (75 mg total) by mouth daily., Disp: 30 tablet, Rfl: 11   cyanocobalamin  (VITAMIN B12) 1000 MCG tablet, Take 1 tablet (1,000 mcg total) by mouth daily., Disp: 90 tablet, Rfl: 1   famotidine  (PEPCID ) 40 MG tablet, Take 1 tablet (40 mg  total) by mouth every evening., Disp: 30 tablet, Rfl: 1   furosemide  (LASIX ) 20 MG tablet, Take 1 tablet (20 mg total) by mouth daily as needed. For weight gain of 3 lb in 24 hours or 5 lbs in one week, Disp: 90 tablet, Rfl: 1   isosorbide  mononitrate (IMDUR ) 60 MG 24 hr tablet, Take 1 tablet (60 mg total) by mouth daily. Take 1 tab every morning and 1/2 tab every evening, Disp: 45 tablet, Rfl: 3   JARDIANCE  10 MG TABS tablet, TAKE ONE TABLET BY MOUTH DAILY, Disp: 90 tablet, Rfl: 2   Multiple Vitamins-Minerals (CENTRUM ADULT PO), Take by mouth daily., Disp: , Rfl:    omeprazole  (PRILOSEC  OTC) 20 MG tablet, Take 1 tablet (20 mg total) by mouth daily., Disp: 30 tablet, Rfl: 0   ondansetron  (ZOFRAN -ODT) 8 MG disintegrating tablet, Take 1 tablet (8 mg total) by mouth every 8 (eight) hours as needed for nausea or vomiting., Disp: 20 tablet, Rfl: 0   ranolazine  (RANEXA ) 500 MG 12 hr tablet, Take 1 tablet (500 mg total) by mouth 2 (two) times daily., Disp: 180 tablet, Rfl: 1  sacubitril -valsartan  (ENTRESTO ) 24-26 MG, Take 1 tablet by mouth 2 (two) times daily., Disp: , Rfl:    spironolactone  (ALDACTONE ) 25 MG tablet, Take 25 mg by mouth daily., Disp: , Rfl:    sucralfate  (CARAFATE ) 1 GM/10ML suspension, Take 10 mLs (1 g total) by mouth 4 (four) times daily., Disp: 420 mL, Rfl: 1 [2]  Social History Tobacco Use  Smoking Status Former   Types: Cigars   Quit date: 08/13/2021   Years since quitting: 2.8  Smokeless Tobacco Not on file  [3]  Allergies Allergen Reactions   Ceftriaxone  Rash    Macular rash with itchness   Ibuprofen Other (See Comments)    History of GIB with ibuprofen    Sulfacetamide Other (See Comments)    Pt can tolerate sulfacetamide 500mg     Neomycin-Bacitracin Zn-Polymyx     Other reaction(s): Other (See Comments)   Spironolactone      Hyperkalemia   "

## 2024-06-03 ENCOUNTER — Other Ambulatory Visit: Payer: Self-pay | Admitting: Family

## 2024-06-13 ENCOUNTER — Other Ambulatory Visit: Payer: Self-pay

## 2024-07-04 ENCOUNTER — Ambulatory Visit: Admitting: Family

## 2024-10-26 ENCOUNTER — Ambulatory Visit: Admitting: Family
